# Patient Record
Sex: Female | Born: 1945 | Race: White | Hispanic: No | Smoking: Never smoker
Health system: Southern US, Community
[De-identification: ages and names within clinical notes are randomized; demographics above are authoritative.]

## PROBLEM LIST (undated history)

## (undated) DIAGNOSIS — R1310 Dysphagia, unspecified: Secondary | ICD-10-CM

## (undated) DIAGNOSIS — E785 Hyperlipidemia, unspecified: Secondary | ICD-10-CM

## (undated) DIAGNOSIS — I2510 Atherosclerotic heart disease of native coronary artery without angina pectoris: Secondary | ICD-10-CM

## (undated) DIAGNOSIS — Q438 Other specified congenital malformations of intestine: Secondary | ICD-10-CM

## (undated) DIAGNOSIS — M069 Rheumatoid arthritis, unspecified: Secondary | ICD-10-CM

## (undated) DIAGNOSIS — M8580 Other specified disorders of bone density and structure, unspecified site: Secondary | ICD-10-CM

## (undated) DIAGNOSIS — T4145XA Adverse effect of unspecified anesthetic, initial encounter: Secondary | ICD-10-CM

## (undated) DIAGNOSIS — M51369 Other intervertebral disc degeneration, lumbar region without mention of lumbar back pain or lower extremity pain: Secondary | ICD-10-CM

## (undated) DIAGNOSIS — N903 Dysplasia of vulva, unspecified: Secondary | ICD-10-CM

## (undated) DIAGNOSIS — J8410 Pulmonary fibrosis, unspecified: Secondary | ICD-10-CM

## (undated) DIAGNOSIS — K635 Polyp of colon: Secondary | ICD-10-CM

## (undated) DIAGNOSIS — K5790 Diverticulosis of intestine, part unspecified, without perforation or abscess without bleeding: Secondary | ICD-10-CM

## (undated) DIAGNOSIS — R011 Cardiac murmur, unspecified: Secondary | ICD-10-CM

## (undated) DIAGNOSIS — K5900 Constipation, unspecified: Secondary | ICD-10-CM

## (undated) DIAGNOSIS — Z9109 Other allergy status, other than to drugs and biological substances: Secondary | ICD-10-CM

## (undated) DIAGNOSIS — M349 Systemic sclerosis, unspecified: Secondary | ICD-10-CM

## (undated) DIAGNOSIS — D649 Anemia, unspecified: Secondary | ICD-10-CM

## (undated) DIAGNOSIS — K639 Disease of intestine, unspecified: Secondary | ICD-10-CM

## (undated) DIAGNOSIS — K5520 Angiodysplasia of colon without hemorrhage: Secondary | ICD-10-CM

## (undated) DIAGNOSIS — R0600 Dyspnea, unspecified: Secondary | ICD-10-CM

## (undated) DIAGNOSIS — M5136 Other intervertebral disc degeneration, lumbar region: Secondary | ICD-10-CM

## (undated) DIAGNOSIS — K219 Gastro-esophageal reflux disease without esophagitis: Secondary | ICD-10-CM

## (undated) DIAGNOSIS — N6019 Diffuse cystic mastopathy of unspecified breast: Secondary | ICD-10-CM

## (undated) DIAGNOSIS — K279 Peptic ulcer, site unspecified, unspecified as acute or chronic, without hemorrhage or perforation: Secondary | ICD-10-CM

## (undated) DIAGNOSIS — I10 Essential (primary) hypertension: Secondary | ICD-10-CM

## (undated) DIAGNOSIS — D126 Benign neoplasm of colon, unspecified: Secondary | ICD-10-CM

## (undated) DIAGNOSIS — I2584 Coronary atherosclerosis due to calcified coronary lesion: Secondary | ICD-10-CM

## (undated) DIAGNOSIS — B3781 Candidal esophagitis: Secondary | ICD-10-CM

## (undated) DIAGNOSIS — T8859XA Other complications of anesthesia, initial encounter: Secondary | ICD-10-CM

## (undated) DIAGNOSIS — I7300 Raynaud's syndrome without gangrene: Secondary | ICD-10-CM

## (undated) DIAGNOSIS — C801 Malignant (primary) neoplasm, unspecified: Secondary | ICD-10-CM

## (undated) DIAGNOSIS — K224 Dyskinesia of esophagus: Secondary | ICD-10-CM

## (undated) DIAGNOSIS — I340 Nonrheumatic mitral (valve) insufficiency: Secondary | ICD-10-CM

## (undated) HISTORY — PX: ESOPHAGOGASTRODUODENOSCOPY: SHX1529

## (undated) HISTORY — DX: Angiodysplasia of colon without hemorrhage: K55.20

## (undated) HISTORY — DX: Other allergy status, other than to drugs and biological substances: Z91.09

## (undated) HISTORY — DX: Constipation, unspecified: K59.00

## (undated) HISTORY — DX: Dysplasia of vulva, unspecified: N90.3

## (undated) HISTORY — DX: Benign neoplasm of colon, unspecified: D12.6

## (undated) HISTORY — PX: ABDOMINAL HYSTERECTOMY: SHX81

## (undated) HISTORY — PX: COLONOSCOPY: SHX174

## (undated) HISTORY — DX: Atherosclerotic heart disease of native coronary artery without angina pectoris: I25.10

## (undated) HISTORY — PX: BREAST LUMPECTOMY: SHX2

## (undated) HISTORY — DX: Candidal esophagitis: B37.81

## (undated) HISTORY — PX: CARDIAC CATHETERIZATION: SHX172

## (undated) HISTORY — PX: BREAST CYST EXCISION: SHX579

## (undated) HISTORY — DX: Diverticulosis of intestine, part unspecified, without perforation or abscess without bleeding: K57.90

## (undated) HISTORY — PX: COLON SURGERY: SHX602

## (undated) HISTORY — DX: Disease of intestine, unspecified: K63.9

## (undated) HISTORY — PX: CARPAL TUNNEL RELEASE: SHX101

## (undated) HISTORY — DX: Coronary atherosclerosis due to calcified coronary lesion: I25.84

## (undated) HISTORY — DX: Polyp of colon: K63.5

## (undated) HISTORY — PX: HEEL SPUR EXCISION: SHX1733

## (undated) HISTORY — PX: EYE SURGERY: SHX253

## (undated) HISTORY — DX: Other specified congenital malformations of intestine: Q43.8

## (undated) HISTORY — DX: Peptic ulcer, site unspecified, unspecified as acute or chronic, without hemorrhage or perforation: K27.9

## (undated) SURGERY — Surgical Case
Anesthesia: *Unknown

---
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
## 2004-11-28 ENCOUNTER — Ambulatory Visit: Payer: Self-pay | Admitting: Internal Medicine

## 2005-04-02 ENCOUNTER — Encounter: Payer: Self-pay | Admitting: Rheumatology

## 2006-01-01 ENCOUNTER — Ambulatory Visit: Payer: Self-pay | Admitting: Internal Medicine

## 2006-09-15 ENCOUNTER — Ambulatory Visit: Payer: Self-pay | Admitting: Rheumatology

## 2007-01-12 ENCOUNTER — Ambulatory Visit: Payer: Self-pay | Admitting: Internal Medicine

## 2008-02-04 ENCOUNTER — Ambulatory Visit: Payer: Self-pay | Admitting: Internal Medicine

## 2008-10-30 ENCOUNTER — Ambulatory Visit: Payer: Self-pay

## 2009-02-06 ENCOUNTER — Ambulatory Visit: Payer: Self-pay | Admitting: Internal Medicine

## 2010-03-13 ENCOUNTER — Ambulatory Visit: Payer: Self-pay | Admitting: Internal Medicine

## 2010-03-21 ENCOUNTER — Ambulatory Visit: Payer: Self-pay | Admitting: Internal Medicine

## 2010-12-03 ENCOUNTER — Ambulatory Visit: Payer: Self-pay | Admitting: Specialist

## 2011-03-24 ENCOUNTER — Ambulatory Visit: Payer: Self-pay | Admitting: Internal Medicine

## 2011-09-04 ENCOUNTER — Ambulatory Visit: Payer: Self-pay | Admitting: Gastroenterology

## 2011-09-30 ENCOUNTER — Ambulatory Visit: Payer: Self-pay | Admitting: Gastroenterology

## 2011-09-30 HISTORY — PX: COLONOSCOPY: SHX174

## 2011-10-29 ENCOUNTER — Ambulatory Visit: Payer: Self-pay | Admitting: Specialist

## 2012-01-12 ENCOUNTER — Ambulatory Visit: Payer: Self-pay | Admitting: Internal Medicine

## 2012-03-24 ENCOUNTER — Ambulatory Visit: Payer: Self-pay | Admitting: Internal Medicine

## 2012-04-06 ENCOUNTER — Ambulatory Visit: Payer: Self-pay | Admitting: Specialist

## 2012-04-06 LAB — CREATININE, SERUM
Creatinine: 0.79 mg/dL (ref 0.60–1.30)
EGFR (Non-African Amer.): >60

## 2013-03-28 ENCOUNTER — Ambulatory Visit: Payer: Self-pay | Admitting: Internal Medicine

## 2014-03-15 DIAGNOSIS — M349 Systemic sclerosis, unspecified: Secondary | ICD-10-CM | POA: Insufficient documentation

## 2014-03-15 DIAGNOSIS — M069 Rheumatoid arthritis, unspecified: Secondary | ICD-10-CM

## 2014-03-15 DIAGNOSIS — M8580 Other specified disorders of bone density and structure, unspecified site: Secondary | ICD-10-CM | POA: Insufficient documentation

## 2014-03-29 ENCOUNTER — Ambulatory Visit: Payer: Self-pay | Admitting: Internal Medicine

## 2014-03-30 DIAGNOSIS — Z79899 Other long term (current) drug therapy: Secondary | ICD-10-CM | POA: Insufficient documentation

## 2014-08-29 DIAGNOSIS — I10 Essential (primary) hypertension: Secondary | ICD-10-CM | POA: Insufficient documentation

## 2015-02-13 ENCOUNTER — Ambulatory Visit: Payer: Self-pay | Admitting: Rheumatology

## 2015-03-27 ENCOUNTER — Other Ambulatory Visit: Payer: Self-pay | Admitting: Internal Medicine

## 2015-03-27 DIAGNOSIS — Z1231 Encounter for screening mammogram for malignant neoplasm of breast: Secondary | ICD-10-CM

## 2015-04-10 ENCOUNTER — Other Ambulatory Visit: Payer: Self-pay | Admitting: Internal Medicine

## 2015-04-10 ENCOUNTER — Ambulatory Visit
Admission: RE | Admit: 2015-04-10 | Discharge: 2015-04-10 | Disposition: A | Discharge status: Home / Self Care | Payer: Medicare Other | Source: Ambulatory Visit | Attending: Internal Medicine | Admitting: Internal Medicine

## 2015-04-10 DIAGNOSIS — Z1231 Encounter for screening mammogram for malignant neoplasm of breast: Secondary | ICD-10-CM

## 2015-04-10 HISTORY — DX: Rheumatoid arthritis, unspecified: M06.9

## 2015-04-10 HISTORY — DX: Malignant (primary) neoplasm, unspecified: C80.1

## 2015-09-26 ENCOUNTER — Other Ambulatory Visit: Payer: Self-pay | Admitting: Internal Medicine

## 2015-09-26 DIAGNOSIS — R0609 Other forms of dyspnea: Principal | ICD-10-CM

## 2015-09-26 DIAGNOSIS — R61 Generalized hyperhidrosis: Secondary | ICD-10-CM

## 2015-10-01 ENCOUNTER — Ambulatory Visit
Admission: RE | Admit: 2015-10-01 | Discharge: 2015-10-01 | Disposition: A | Discharge status: Home / Self Care | Payer: Medicare Other | Source: Ambulatory Visit | Attending: Internal Medicine | Admitting: Internal Medicine

## 2015-10-01 DIAGNOSIS — M349 Systemic sclerosis, unspecified: Secondary | ICD-10-CM | POA: Diagnosis not present

## 2015-10-01 DIAGNOSIS — R61 Generalized hyperhidrosis: Secondary | ICD-10-CM

## 2015-10-01 DIAGNOSIS — R0609 Other forms of dyspnea: Secondary | ICD-10-CM

## 2016-02-04 ENCOUNTER — Other Ambulatory Visit: Payer: Self-pay | Admitting: Internal Medicine

## 2016-02-04 DIAGNOSIS — Z1231 Encounter for screening mammogram for malignant neoplasm of breast: Secondary | ICD-10-CM

## 2016-04-21 ENCOUNTER — Ambulatory Visit
Admission: RE | Admit: 2016-04-21 | Discharge: 2016-04-21 | Disposition: A | Discharge status: Home / Self Care | Payer: Medicare Other | Source: Ambulatory Visit | Attending: Internal Medicine | Admitting: Internal Medicine

## 2016-04-21 ENCOUNTER — Other Ambulatory Visit: Payer: Self-pay | Admitting: Internal Medicine

## 2016-04-21 DIAGNOSIS — Z1231 Encounter for screening mammogram for malignant neoplasm of breast: Secondary | ICD-10-CM

## 2016-06-18 ENCOUNTER — Inpatient Hospital Stay
Admission: AD | Admit: 2016-06-18 | Payer: No Typology Code available for payment source | Source: Ambulatory Visit | Admitting: Internal Medicine

## 2016-09-16 ENCOUNTER — Other Ambulatory Visit: Payer: Self-pay | Admitting: Gastroenterology

## 2016-09-16 DIAGNOSIS — R1310 Dysphagia, unspecified: Secondary | ICD-10-CM

## 2016-09-30 ENCOUNTER — Ambulatory Visit: Payer: No Typology Code available for payment source

## 2016-10-07 ENCOUNTER — Ambulatory Visit: Payer: No Typology Code available for payment source

## 2016-10-16 ENCOUNTER — Ambulatory Visit: Payer: No Typology Code available for payment source

## 2016-11-05 ENCOUNTER — Ambulatory Visit
Admission: RE | Admit: 2016-11-05 | Discharge: 2016-11-05 | Disposition: A | Discharge status: Home / Self Care | Payer: Medicare Other | Source: Ambulatory Visit | Attending: Gastroenterology | Admitting: Gastroenterology

## 2016-11-05 DIAGNOSIS — R1310 Dysphagia, unspecified: Secondary | ICD-10-CM

## 2016-11-05 DIAGNOSIS — M349 Systemic sclerosis, unspecified: Secondary | ICD-10-CM | POA: Diagnosis not present

## 2016-11-05 DIAGNOSIS — K219 Gastro-esophageal reflux disease without esophagitis: Secondary | ICD-10-CM | POA: Diagnosis not present

## 2016-11-05 DIAGNOSIS — K228 Other specified diseases of esophagus: Secondary | ICD-10-CM | POA: Insufficient documentation

## 2016-11-05 DIAGNOSIS — K449 Diaphragmatic hernia without obstruction or gangrene: Secondary | ICD-10-CM | POA: Insufficient documentation

## 2016-11-10 ENCOUNTER — Encounter: Payer: Self-pay | Admitting: General Surgery

## 2016-11-10 ENCOUNTER — Ambulatory Visit (INDEPENDENT_AMBULATORY_CARE_PROVIDER_SITE_OTHER): Payer: Medicare Other | Admitting: General Surgery

## 2016-11-10 VITALS — BP 136/74 | Resp 16 | Ht 61.0 in | Wt 199.0 lb

## 2016-11-10 DIAGNOSIS — N9089 Other specified noninflammatory disorders of vulva and perineum: Secondary | ICD-10-CM

## 2016-11-10 DIAGNOSIS — C439 Malignant melanoma of skin, unspecified: Secondary | ICD-10-CM

## 2016-11-10 NOTE — Patient Instructions (Signed)
Patient to be scheduled for left thigh incision at [facility]

## 2016-11-10 NOTE — Progress Notes (Signed)
Patient ID: Erin [name], female   DOB: 1946/11/07, 70 y.o.   MRN: [id number]  Chief Complaint  Patient presents with  . Mass    HPI Erin [name] is a 70 y.o. female here today for a evaluation of a left thigh melanoma. The patient reports that since last year screening dermatology exam she had noticed a white nodule on the mid/distal aspect of the left anterior thigh. In the last 3-4 months this had become pigmented with irregular margins. She recently underwent a biopsy of this area showing a melanoma.  The patient reports that there is a small area on the inferior aspect of her labia that the dermatologist has normally examined as part of her annual exam, and she felt that this may have been overlooked with the changes of the left thigh lesion and resulting biopsy. She herself cannot see this area but is aware of it by palpation.   HPI  Past Medical History:  Diagnosis Date  . Cancer ([place])    melanoma  . RA (rheumatoid arthritis) ([place])     Past Surgical History:  Procedure Laterality Date  . ABDOMINAL HYSTERECTOMY    . BREAST CYST EXCISION Left 20+ years ago   No scar visible    Family History  Problem Relation Age of Onset  . Endometrial cancer Mother 17  . Cancer Father 69    bladder    Social History Social History  Substance Use Topics  . Smoking status: Never Smoker  . Smokeless tobacco: Never Used  . Alcohol use No    Allergies  Allergen Reactions  . Remicade [Infliximab] Shortness Of Breath and Itching  . Fosamax [Alendronate Sodium]     GI Bleed   . Procardia [Nifedipine] Hives  . Sulfur Swelling  . Wellbutrin [Bupropion] Anxiety    Current Outpatient Prescriptions  Medication Sig Dispense Refill  . ALPRAZolam (XANAX) 0.5 MG tablet TAKE ONE TABLET FOUR TIMES DAILY    . amLODipine (NORVASC) 5 MG tablet TAKE 2 TABLETS BY MOUTH ONCE DAILY    . aspirin EC 81 MG tablet Take by mouth.    . Clobetasol Prop Emollient Base (CLOBETASOL PROPIONATE E) 0.05  % emollient cream Apply topically 2 (two) times daily.    Marland Kitchen dexlansoprazole (DEXILANT) 60 MG capsule Take by mouth.    Marland Kitchen FLUoxetine (PROZAC) 20 MG capsule TAKE FOUR CAPSULES EVERY DAY    . folic acid (FOLVITE) A999333 MCG tablet Take by mouth.    . furosemide (LASIX) 20 MG tablet TAKE ONE TABLET BY MOUTH EVERY DAY AS NEEDED FOR EDEMA    . latanoprost (XALATAN) 0.005 % ophthalmic solution Apply to eye.    . methotrexate (RHEUMATREX) 2.5 MG tablet TAKE 10 TABLETS EVERY 7 DAYS    . Multiple Vitamin (MULTI-VITAMINS) TABS Take by mouth.    . Omega-3 Fatty Acids (FISH OIL PO) Take by mouth.    . simvastatin (ZOCOR) 80 MG tablet TAKE ONE TABLET AT BEDTIME    . zolpidem (AMBIEN) 10 MG tablet Take by mouth.     No current facility-administered medications for this visit.     Review of Systems Review of Systems  Blood pressure 136/74, resp. rate 16, height 5\' 1"  (1.549 m), weight 199 lb (90.3 kg).  Physical Exam Physical Exam  Constitutional: She is oriented to person, place, and time. She appears well-developed and well-nourished.  Eyes: Conjunctivae are normal. No scleral icterus.  Neck: Neck supple.  Cardiovascular: Normal rate, regular rhythm and normal heart sounds.  Pulmonary/Chest: Effort normal and breath sounds normal.  Abdominal: There is no hepatosplenomegaly.  Genitourinary:     Genitourinary Comments: 6 mm pink flat mass at the labia   Musculoskeletal:       Legs: Lymphadenopathy:    She has no cervical adenopathy.       Left: No inguinal adenopathy present.  Neurological: She is alert and oriented to person, place, and time.  Skin: Skin is warm and dry.       Data Reviewed Biopsy report dated 10/29/2016 from a shave biopsy of the left anterior distal thigh showed a invasive malignant melanoma. Breslow thickness 1.39 mm. Heart level IV. No ulceration. No evidence of regression. Margins close at 0.23 mm deep. Pathologic PT 2A. Reported size 8 x 8 x 2 mm. Laboratory  studies from her primary care office dated 11/05/2016 showed a hemoglobin of 11.2, without dramatic change over the last year. MCV 91. White blood cell count 7800. Platelet count 325,000.  Comprehensive metabolic panel of the same date was normal. Potassium 5.1. Normal renal function. Normal liver function.   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  Long-standing nodule inferior aspect of the left labia.    Plan    Indication for wide excision and sentinel node biopsy from the left thigh lesion reviewed. As the patient is unable to visualize the labia lesion and reports that this is an area of her concern, we will excise this at the same setting. Potential for slow healing of the thigh lesion as well as lower extremity edema with lymph node biopsy was reviewed.  A baseline chest x-ray has been recommended due to the thickness of the lesion. Liver function studies did not bear repeating.    Patient to be scheduled for left thigh incision at [facility].  Patient's surgery has been scheduled for 11-19-16 at [facility]. It is okay for patient to continue 81 mg aspirin once daily.   This information has been scribed by Gaspar Cola CMA.  Robert Bellow 11/11/2016, 10:54 AM

## 2016-11-11 ENCOUNTER — Telehealth: Payer: Self-pay

## 2016-11-11 ENCOUNTER — Other Ambulatory Visit: Payer: Self-pay | Admitting: *Deleted

## 2016-11-11 DIAGNOSIS — C439 Malignant melanoma of skin, unspecified: Secondary | ICD-10-CM | POA: Insufficient documentation

## 2016-11-11 DIAGNOSIS — N9089 Other specified noninflammatory disorders of vulva and perineum: Secondary | ICD-10-CM | POA: Insufficient documentation

## 2016-11-11 NOTE — Telephone Encounter (Signed)
Patient notified of arrival time and location for surgery. The patient will arrive at the Radiology desk at [facility] on 11/19/16 at 10:15 am. The patient is aware of date, time, and instructions.

## 2016-11-14 ENCOUNTER — Inpatient Hospital Stay: Admission: RE | Admit: 2016-11-14 | Payer: No Typology Code available for payment source | Source: Ambulatory Visit

## 2016-11-17 ENCOUNTER — Encounter
Admission: RE | Admit: 2016-11-17 | Discharge: 2016-11-17 | Disposition: A | Discharge status: Home / Self Care | Payer: Medicare Other | Source: Ambulatory Visit | Attending: General Surgery | Admitting: General Surgery

## 2016-11-17 HISTORY — DX: Gastro-esophageal reflux disease without esophagitis: K21.9

## 2016-11-17 HISTORY — DX: Anemia, unspecified: D64.9

## 2016-11-17 HISTORY — DX: Cardiac murmur, unspecified: R01.1

## 2016-11-17 HISTORY — DX: Pulmonary fibrosis, unspecified: J84.10

## 2016-11-17 HISTORY — DX: Dyspnea, unspecified: R06.00

## 2016-11-17 HISTORY — DX: Systemic sclerosis, unspecified: M34.9

## 2016-11-17 HISTORY — DX: Nonrheumatic mitral (valve) insufficiency: I34.0

## 2016-11-17 NOTE — Pre-Procedure Instructions (Signed)
Dr Ronelle Nigh called back and stated that he talked with Dr Bary Castilla and that Dr Bary Castilla is going to talk with Dr Doy Hutching to see if they can get pt in before surgery on 11-19-16 to see Dr Rockey Situ

## 2016-11-17 NOTE — Pre-Procedure Instructions (Signed)
Called Dr Ronelle Nigh regarding pt stating that she gets SOB with exertion and is unable to walk a mile without getting SOB- Pt states that Dr Doy Hutching wants pt to see Dr Rockey Situ after her 11-19-16 surgery. Mentioned to Dr Ronelle Nigh that in Dr Doy Hutching H&P from 11-12-16, Dr Doy Hutching states pt ok to proceed with surgery. Pt with a history of scleroderma. Dr Ronelle Nigh is going to call Dr Bary Castilla and discuss this with him and call me back

## 2016-11-17 NOTE — Patient Instructions (Signed)
  Your procedure is scheduled on: 11-19-16 [facility]) Report to [place] (2ND DESK ON RIGHT) @ 10:15 AM   Remember: Instructions that are not followed completely may result in serious medical risk, up to and including death, or upon the discretion of your surgeon and anesthesiologist your surgery may need to be rescheduled.    _x___ 1. Do not eat food or drink liquids after midnight. No gum chewing or hard candies.     __x__ 2. No Alcohol for 24 hours before or after surgery.   __x__3. No Smoking for 24 prior to surgery.   ____  4. Bring all medications with you on the day of surgery if instructed.    __x__ 5. Notify your doctor if there is any change in your medical condition     (cold, fever, infections).     Do not wear jewelry, make-up, hairpins, clips or nail polish.  Do not wear lotions, powders, or perfumes. You may wear deodorant.  Do not shave 48 hours prior to surgery. Men may shave face and neck.  Do not bring valuables to the hospital.    [facility] is not responsible for any belongings or valuables.               Contacts, dentures or bridgework may not be worn into surgery.  Leave your suitcase in the car. After surgery it may be brought to your room.  For patients admitted to the hospital, discharge time is determined by your treatment team.   Patients discharged the day of surgery will not be allowed to drive home.  You will need someone to drive you home and stay with you the night of your procedure.    Please read over the following fact sheets that you were given:   [facility] Preparing for Surgery and or MRSA Information   _x___ Take these medicines the morning of surgery with A SIP OF WATER:    1. AMLODIPINE (NORVASC)  2. DEXILANT  3. FLUOXETINE (PROZAC)  4.  5.  6.  ____Fleets enema or Magnesium Citrate as directed.   _x___ Use CHG Soap or sage wipes as directed on instruction sheet   ____ Use inhalers on the day of surgery and  bring to hospital day of surgery  ____ Stop metformin 2 days prior to surgery    ____ Take 1/2 of usual insulin dose the night before surgery and none on the morning of   surgery.   _X___ Stop Aspirin, Coumadin, Pllavix ,Eliquis, Effient, or Pradaxa-OK TO CONTINUE 81 MG ASPIRIN PER DR BYRNETT  __ Stop Anti-inflammatories such as Advil, Aleve, Ibuprofen, Motrin, Naproxen,          Naprosyn, Goodies powders or aspirin products. Ok to take Tylenol.   _X___ Stop supplements until after surgery-STOP FISH OIL NOW  ____ Bring C-Pap to the hospital.

## 2016-11-17 NOTE — Pre-Procedure Instructions (Signed)
Dr Ronelle Nigh called back and said that Dr Bary Castilla spoke with Dr Doy Hutching and Dr Doy Hutching said that pt had been seen by [facility] cardiology previously and that pt wanted to change cardiologists and that is why she was being referred to [facility]. Dr Ronelle Nigh said that when pt comes in for her EKG tomorrow and if there is significant SOB to call the Anesthesiologist on call and have one of them come and evaluate her while in PAT

## 2016-11-18 ENCOUNTER — Encounter
Admission: RE | Admit: 2016-11-18 | Discharge: 2016-11-18 | Disposition: A | Discharge status: Home / Self Care | Payer: Medicare Other | Source: Ambulatory Visit | Attending: General Surgery | Admitting: General Surgery

## 2016-11-18 ENCOUNTER — Ambulatory Visit
Admission: RE | Admit: 2016-11-18 | Discharge: 2016-11-18 | Disposition: A | Discharge status: Home / Self Care | Payer: Medicare Other | Source: Ambulatory Visit | Attending: General Surgery | Admitting: General Surgery

## 2016-11-18 DIAGNOSIS — I700 Atherosclerosis of aorta: Secondary | ICD-10-CM | POA: Diagnosis not present

## 2016-11-18 DIAGNOSIS — J984 Other disorders of lung: Secondary | ICD-10-CM | POA: Insufficient documentation

## 2016-11-18 DIAGNOSIS — R011 Cardiac murmur, unspecified: Secondary | ICD-10-CM | POA: Diagnosis not present

## 2016-11-18 DIAGNOSIS — D0372 Melanoma in situ of left lower limb, including hip: Secondary | ICD-10-CM

## 2016-11-18 NOTE — Pre-Procedure Instructions (Signed)
INFORMED DR Ronelle Nigh THAT WHEN PT CAME IN FOR EKG, THERE WAS NO NOTICEABLE SOB-CHECKED HER SAT AND SHE STAYED BETWEEN 98-99 % RA

## 2016-11-18 NOTE — Pre-Procedure Instructions (Signed)
CALLED DR [id number] REGARDING ABNORMAL EKG- DR [id number] REVEIWED EKG IN EPIC AND STATES THAT PT IS OK TO PROCEED WITH SURGERY

## 2016-11-19 ENCOUNTER — Encounter: Payer: Self-pay | Admitting: *Deleted

## 2016-11-19 ENCOUNTER — Encounter
Admission: RE | Admit: 2016-11-19 | Discharge: 2016-11-19 | Disposition: A | Discharge status: Home / Self Care | Payer: Medicare Other | Source: Ambulatory Visit | Attending: General Surgery | Admitting: General Surgery

## 2016-11-19 ENCOUNTER — Ambulatory Visit: Payer: Medicare Other | Admitting: Anesthesiology

## 2016-11-19 ENCOUNTER — Encounter
Admission: RE | Disposition: A | Discharge status: Home / Self Care | Payer: Self-pay | Source: Ambulatory Visit | Attending: General Surgery

## 2016-11-19 ENCOUNTER — Ambulatory Visit
Admission: RE | Admit: 2016-11-19 | Discharge: 2016-11-19 | Disposition: A | Discharge status: Home / Self Care | Payer: Medicare Other | Source: Ambulatory Visit | Attending: General Surgery | Admitting: General Surgery

## 2016-11-19 DIAGNOSIS — I340 Nonrheumatic mitral (valve) insufficiency: Secondary | ICD-10-CM | POA: Insufficient documentation

## 2016-11-19 DIAGNOSIS — C4372 Malignant melanoma of left lower limb, including hip: Secondary | ICD-10-CM | POA: Insufficient documentation

## 2016-11-19 DIAGNOSIS — M8580 Other specified disorders of bone density and structure, unspecified site: Secondary | ICD-10-CM | POA: Diagnosis not present

## 2016-11-19 DIAGNOSIS — I10 Essential (primary) hypertension: Secondary | ICD-10-CM | POA: Insufficient documentation

## 2016-11-19 DIAGNOSIS — M069 Rheumatoid arthritis, unspecified: Secondary | ICD-10-CM | POA: Insufficient documentation

## 2016-11-19 DIAGNOSIS — M349 Systemic sclerosis, unspecified: Secondary | ICD-10-CM | POA: Insufficient documentation

## 2016-11-19 DIAGNOSIS — I2510 Atherosclerotic heart disease of native coronary artery without angina pectoris: Secondary | ICD-10-CM | POA: Insufficient documentation

## 2016-11-19 DIAGNOSIS — Z86010 Personal history of colonic polyps: Secondary | ICD-10-CM | POA: Insufficient documentation

## 2016-11-19 DIAGNOSIS — I7300 Raynaud's syndrome without gangrene: Secondary | ICD-10-CM | POA: Insufficient documentation

## 2016-11-19 DIAGNOSIS — C439 Malignant melanoma of skin, unspecified: Secondary | ICD-10-CM

## 2016-11-19 DIAGNOSIS — E7800 Pure hypercholesterolemia, unspecified: Secondary | ICD-10-CM | POA: Diagnosis not present

## 2016-11-19 DIAGNOSIS — K219 Gastro-esophageal reflux disease without esophagitis: Secondary | ICD-10-CM | POA: Diagnosis not present

## 2016-11-19 HISTORY — PX: MASS EXCISION: SHX2000

## 2016-11-19 HISTORY — PX: EXCISION HYDRADENITIS LABIA: SHX6273

## 2016-11-19 HISTORY — PX: SENTINEL NODE BIOPSY: SHX6608

## 2016-11-19 SURGERY — EXCISION MASS
Anesthesia: General | Laterality: Left | Wound class: Clean

## 2016-11-19 MED ORDER — FENTANYL CITRATE (PF) 100 MCG/2ML IJ SOLN: 25.0000 ug | INTRAMUSCULAR | Status: DC | PRN

## 2016-11-19 MED ORDER — ONDANSETRON HCL 4 MG/2ML IJ SOLN: 4.0000 mg | Freq: Once | INTRAMUSCULAR | Status: DC | PRN

## 2016-11-19 MED ORDER — TECHNETIUM TC 99M SULFUR COLLOID FILTERED
0.4440 | Freq: Once | INTRAVENOUS | Status: AC | PRN
  Administered 2016-11-19: 0.444 via INTRADERMAL

## 2016-11-19 MED ORDER — DEXAMETHASONE SODIUM PHOSPHATE 10 MG/ML IJ SOLN
INTRAMUSCULAR | Status: DC | PRN
  Administered 2016-11-19: 10 mg via INTRAVENOUS

## 2016-11-19 MED ORDER — ACETAMINOPHEN 10 MG/ML IV SOLN
INTRAVENOUS | Status: DC | PRN
  Administered 2016-11-19: 1000 mg via INTRAVENOUS

## 2016-11-19 MED ORDER — FENTANYL CITRATE (PF) 100 MCG/2ML IJ SOLN
INTRAMUSCULAR | Status: DC | PRN
  Administered 2016-11-19 (×2): 50 ug via INTRAVENOUS

## 2016-11-19 MED ORDER — MIDAZOLAM HCL 2 MG/2ML IJ SOLN
INTRAMUSCULAR | Status: DC | PRN
  Administered 2016-11-19: 2 mg via INTRAVENOUS
  Administered 2016-11-19 (×2): 50 mg via INTRAVENOUS

## 2016-11-19 MED ORDER — LIDOCAINE-EPINEPHRINE 1 %-1:100000 IJ SOLN
INTRAMUSCULAR | Status: DC | PRN
  Administered 2016-11-19: 20 mL

## 2016-11-19 MED ORDER — BUPIVACAINE HCL 0.5 % IJ SOLN
INTRAMUSCULAR | Status: DC | PRN
  Administered 2016-11-19: 30 mL

## 2016-11-19 MED ORDER — HYDROCODONE-ACETAMINOPHEN 5-325 MG PO TABS
ORAL_TABLET | ORAL | Status: AC
  Filled 2016-11-19: qty 1

## 2016-11-19 MED ORDER — HYDROCODONE-ACETAMINOPHEN 5-325 MG PO TABS: 1.0000 | ORAL_TABLET | ORAL | 0 refills | Status: DC | PRN

## 2016-11-19 MED ORDER — PROPOFOL 10 MG/ML IV BOLUS
INTRAVENOUS | Status: DC | PRN
  Administered 2016-11-19: 50 mg via INTRAVENOUS
  Administered 2016-11-19: 200 mg via INTRAVENOUS

## 2016-11-19 MED ORDER — EPHEDRINE SULFATE 50 MG/ML IJ SOLN
INTRAMUSCULAR | Status: DC | PRN
  Administered 2016-11-19: 10 mg via INTRAVENOUS

## 2016-11-19 MED ORDER — ONDANSETRON HCL 4 MG/2ML IJ SOLN
INTRAMUSCULAR | Status: DC | PRN
  Administered 2016-11-19: 4 mg via INTRAVENOUS

## 2016-11-19 MED ORDER — SUCCINYLCHOLINE CHLORIDE 20 MG/ML IJ SOLN
INTRAMUSCULAR | Status: DC | PRN
  Administered 2016-11-19: 80 mg via INTRAVENOUS

## 2016-11-19 MED ORDER — METHYLENE BLUE 0.5 % INJ SOLN
INTRAVENOUS | Status: DC | PRN
  Administered 2016-11-19: 4 mL via SUBMUCOSAL

## 2016-11-19 MED ORDER — BUPIVACAINE HCL (PF) 0.5 % IJ SOLN
INTRAMUSCULAR | Status: AC
  Filled 2016-11-19: qty 30

## 2016-11-19 MED ORDER — BACITRACIN ZINC 500 UNIT/GM EX OINT
TOPICAL_OINTMENT | CUTANEOUS | Status: AC
  Filled 2016-11-19: qty 28.35

## 2016-11-19 MED ORDER — METHYLENE BLUE 0.5 % INJ SOLN
INTRAVENOUS | Status: AC
  Filled 2016-11-19: qty 10

## 2016-11-19 MED ORDER — LIDOCAINE-EPINEPHRINE 1 %-1:100000 IJ SOLN
INTRAMUSCULAR | Status: AC
  Filled 2016-11-19: qty 1

## 2016-11-19 MED ORDER — HYDROCODONE-ACETAMINOPHEN 5-325 MG PO TABS
1.0000 | ORAL_TABLET | ORAL | Status: DC | PRN
  Administered 2016-11-19: 1 via ORAL

## 2016-11-19 MED ORDER — LACTATED RINGERS IV SOLN
INTRAVENOUS | Status: DC
  Administered 2016-11-19: 13:00:00 via INTRAVENOUS

## 2016-11-19 SURGICAL SUPPLY — 42 items
BANDAGE ELASTIC 4 LF NS (GAUZE/BANDAGES/DRESSINGS) IMPLANT
BANDAGE ELASTIC 6 LF NS (GAUZE/BANDAGES/DRESSINGS) IMPLANT
BNDG GAUZE 4.5X4.1 6PLY STRL (MISCELLANEOUS) IMPLANT
CANISTER SUCT 1200ML W/VALVE (MISCELLANEOUS) ×3 IMPLANT
CHLORAPREP W/TINT 26ML (MISCELLANEOUS) ×3 IMPLANT
CLOSURE WOUND 1/2 X4 (GAUZE/BANDAGES/DRESSINGS)
COVER PROBE FLX POLY STRL (MISCELLANEOUS) ×3 IMPLANT
DRAPE LAPAROTOMY 100X77 ABD (DRAPES) ×3 IMPLANT
DRAPE SHEET LG 3/4 BI-LAMINATE (DRAPES) ×3 IMPLANT
DRESSING TELFA 4X3 1S ST N-ADH (GAUZE/BANDAGES/DRESSINGS) IMPLANT
DRSG TEGADERM 4X4.75 (GAUZE/BANDAGES/DRESSINGS) IMPLANT
ELECT REM PT RETURN 9FT ADLT (ELECTROSURGICAL) ×3
ELECTRODE REM PT RTRN 9FT ADLT (ELECTROSURGICAL) ×1 IMPLANT
GAUZE SPONGE 4X4 12PLY STRL (GAUZE/BANDAGES/DRESSINGS) IMPLANT
GLOVE BIO SURGEON STRL SZ7.5 (GLOVE) ×6 IMPLANT
GLOVE INDICATOR 8.0 STRL GRN (GLOVE) ×6 IMPLANT
GOWN STRL REUS W/ TWL LRG LVL3 (GOWN DISPOSABLE) ×1 IMPLANT
GOWN STRL REUS W/ TWL XL LVL3 (GOWN DISPOSABLE) ×1 IMPLANT
GOWN STRL REUS W/TWL LRG LVL3 (GOWN DISPOSABLE) ×2
GOWN STRL REUS W/TWL XL LVL3 (GOWN DISPOSABLE) ×2
KIT RM TURNOVER STRD PROC AR (KITS) ×3 IMPLANT
LABEL OR SOLS (LABEL) ×3 IMPLANT
NDL SAFETY 18GX1.5 (NEEDLE) ×3 IMPLANT
NDL SAFETY 22GX1.5 (NEEDLE) ×6 IMPLANT
NS IRRIG 500ML POUR BTL (IV SOLUTION) ×3 IMPLANT
PACK BASIN MINOR ARMC (MISCELLANEOUS) ×3 IMPLANT
PAD PREP 24X41 OB/GYN DISP (PERSONAL CARE ITEMS) ×3 IMPLANT
SLEVE PROBE SENORX GAMMA FIND (MISCELLANEOUS) IMPLANT
STOCKINETTE STRL 6IN 960660 (GAUZE/BANDAGES/DRESSINGS) IMPLANT
STRIP CLOSURE SKIN 1/2X4 (GAUZE/BANDAGES/DRESSINGS) IMPLANT
SUT ETHILON 4-0 (SUTURE) ×8
SUT ETHILON 4-0 FS2 18XMFL BLK (SUTURE) ×4
SUT VIC AB 2-0 CT1 (SUTURE) IMPLANT
SUT VIC AB 2-0 CT1 27 (SUTURE) ×6
SUT VIC AB 2-0 CT1 TAPERPNT 27 (SUTURE) ×3 IMPLANT
SUT VIC AB 3-0 SH 27 (SUTURE) ×2
SUT VIC AB 3-0 SH 27X BRD (SUTURE) ×1 IMPLANT
SUT VIC AB 4-0 FS2 27 (SUTURE) ×3 IMPLANT
SUT VICRYL+ 3-0 144IN (SUTURE) ×3 IMPLANT
SUTURE ETHLN 4-0 FS2 18XMF BLK (SUTURE) ×4 IMPLANT
SWABSTK COMLB BENZOIN TINCTURE (MISCELLANEOUS) IMPLANT
SYRINGE 10CC LL (SYRINGE) ×3 IMPLANT

## 2016-11-19 NOTE — Transfer of Care (Signed)
Immediate Anesthesia Transfer of Care Note  Patient: MONIYA MEDITZ  Procedure(s) Performed: Procedure(s): EXCISION LEFT THIGH MELANOMA (Left) INGUINAL SENTINEL NODE BIOPSY (Left) EXCISION LABIAL MASS (Left)  Patient Location: PACU  Anesthesia Type:General  Level of Consciousness: patient cooperative and lethargic  Airway & Oxygen Therapy: Patient Spontanous Breathing and Patient connected to face mask oxygen  Post-op Assessment: Report given to RN and Post -op Vital signs reviewed and stable  Post vital signs: Reviewed and stable  Last Vitals:  Vitals:   11/19/16 1204 11/19/16 1457  BP: 132/61 (!) 154/58  Pulse: 73 74  Resp: 16 19  Temp: 36.8 C 36.4 C    Last Pain:  Vitals:   11/19/16 1204  TempSrc: Oral         Complications: No apparent anesthesia complications

## 2016-11-19 NOTE — Anesthesia Procedure Notes (Signed)
Procedure Name: Intubation Date/Time: 11/19/2016 1:06 PM Performed by: Jonna Clark Pre-anesthesia Checklist: Patient identified, Patient being monitored, Timeout performed, Emergency Drugs available and Suction available Patient Re-evaluated:Patient Re-evaluated prior to inductionOxygen Delivery Method: Circle system utilized Preoxygenation: Pre-oxygenation with 100% oxygen Intubation Type: IV induction Ventilation: Mask ventilation without difficulty Laryngoscope Size: Mac and 3 Grade View: Grade I Tube type: Oral Tube size: 7.0 mm Number of attempts: 1 Placement Confirmation: ETT inserted through vocal cords under direct vision,  positive ETCO2 and breath sounds checked- equal and bilateral Secured at: 21 cm Tube secured with: Tape Dental Injury: Teeth and Oropharynx as per pre-operative assessment

## 2016-11-19 NOTE — H&P (Signed)
No interval change in history or exam. For excision left leg melanoma and SLN biopsy, biopsy left labial lesion.

## 2016-11-19 NOTE — Discharge Instructions (Signed)

## 2016-11-19 NOTE — Op Note (Signed)
Preoperative diagnosis: Left leg melanoma. Nodule left lower labia.  Postoperative diagnosis: Same.  Operative procedure: Left leg wide excision with sentinel node biopsy, excision left labial nodule.  Operating surgeon: Ollen Bowl, M.D.  Anesthesia: Gen. endotracheal, Marcaine 0 25 percent with Xylocaine 0.5% with 1-200,000 epinephrine: 50 mL.  Estimated blood loss: 20 mL.  Clinical note: This 70 year old female was recently identified with a 1.34 mm Clark's level IV melanoma of the left distal medial thigh. She was a candidate for wide excision and sentinel node biopsy. She is also been aware of a nodule at the base of the left labia for some time and it was elected to excise this at the same time.  Operative note: The patient was injected with technetium sulfur colloid prior to the procedure. Area of node bearing tissue was marked by the radiologist. After induction of general anesthesia 4 mL of 0.5% methylene blue was injected around the melanoma site which measured approximately 1.2 cm in diameter. Margins measuring 1.5 cm were made around the lesion for a total width of approximate 4 cm in width and 8 cm in length vertically oriented. Skin was incised sharply and remaining dissection with electrocautery. The dissection was carried down to but did not involve the vastus intermedius fascia. Hemostasis was with electrocautery. The deep tissue is mobilized 3 cm circumferentially at the level of the fascia and approximated with interrupted 2-0 Vicryl figure-of-eight sutures in 2 layers. The skin was closed with a running 4-0 nylon suture at either end and then the midportion of the wound was closed with interrupted 4-0 nylon simple sutures. Telfa and Tegaderm dressing applied.  Attention was turned to the inguinal area. The node cecum was used to identify the area of increased uptake. Local anesthesia was infiltrated and a transverse incision made. The skin was divided sharply. Hemostasis was  electrocautery and 3-0 Vicryl ties. A single hot blue node was identified. This was resected and sent for permanent section. Scanning through the inguinal area showed no additional nodal tissue. This wound was closed in layers with running 3-0 Vicryl to the deep dermal layer and a running 4-0 Vicryl septic suture to the skin. Benzoin, Steri-Strips, Telfa and Tegaderm dressing was applied.  Attention was turned to labia where Betadine was applied after the patient was placed in a frog-leg position. Local anesthesia was infiltrated and the area was excised with elliptical incision. This was approximate centimeter in diameter. The specimen was orientated and sent in formalin for routine histology. The wound was closed with a running 4-0 Vicryl septic suture after hemostasis was achieved electrocautery. Bacitracin ointment was applied to this area to enter the procedure.  The patient tolerated the procedure well was taken to recovery in stable condition.

## 2016-11-19 NOTE — Anesthesia Preprocedure Evaluation (Signed)
Anesthesia Evaluation  Patient identified by MRN, date of birth, ID band Patient awake    Reviewed: Allergy & Precautions, H&P , NPO status , Patient's Chart, lab work & pertinent test results, reviewed documented beta blocker date and time   Airway Mallampati: II  TM Distance: >3 FB Neck ROM: full    Dental  (+) Teeth Intact   Pulmonary neg pulmonary ROS, shortness of breath,    Pulmonary exam normal        Cardiovascular Exercise Tolerance: Good negative cardio ROS Normal cardiovascular exam+ Valvular Problems/Murmurs  Rate:Normal     Neuro/Psych negative neurological ROS  negative psych ROS   GI/Hepatic negative GI ROS, Neg liver ROS, GERD  ,  Endo/Other  negative endocrine ROS  Renal/GU negative Renal ROS  negative genitourinary   Musculoskeletal  (+) Arthritis ,   Abdominal   Peds  Hematology negative hematology ROS (+) anemia ,   Anesthesia Other Findings   Reproductive/Obstetrics negative OB ROS                             Anesthesia Physical Anesthesia Plan  ASA: II  Anesthesia Plan: General LMA   Post-op Pain Management:    Induction:   Airway Management Planned:   Additional Equipment:   Intra-op Plan:   Post-operative Plan:   Informed Consent: I have reviewed the patients History and Physical, chart, labs and discussed the procedure including the risks, benefits and alternatives for the proposed anesthesia with the patient or authorized representative who has indicated his/her understanding and acceptance.     Plan Discussed with: CRNA  Anesthesia Plan Comments:         Anesthesia Quick Evaluation

## 2016-11-20 ENCOUNTER — Encounter: Payer: Self-pay | Admitting: General Surgery

## 2016-11-24 ENCOUNTER — Encounter: Payer: Self-pay | Admitting: General Surgery

## 2016-11-24 ENCOUNTER — Ambulatory Visit (INDEPENDENT_AMBULATORY_CARE_PROVIDER_SITE_OTHER): Payer: Medicare Other | Admitting: General Surgery

## 2016-11-24 VITALS — BP 132/68 | HR 74 | Resp 16 | Ht 61.0 in | Wt 196.0 lb

## 2016-11-24 DIAGNOSIS — C439 Malignant melanoma of skin, unspecified: Secondary | ICD-10-CM

## 2016-11-24 NOTE — Anesthesia Postprocedure Evaluation (Signed)
Anesthesia Post Note  Patient: Erin [name]  Procedure(s) Performed: Procedure(s) (LRB): EXCISION LEFT THIGH MELANOMA (Left) INGUINAL SENTINEL NODE BIOPSY (Left) EXCISION LABIAL MASS (Left)  Patient location during evaluation: PACU Anesthesia Type: General Level of consciousness: awake Pain management: pain level controlled Vital Signs Assessment: post-procedure vital signs reviewed and stable Respiratory status: spontaneous breathing Cardiovascular status: stable Anesthetic complications: no    Last Vitals:  Vitals:   11/19/16 1559 11/19/16 1626  BP: (!) 146/69 100/61  Pulse: 62 82  Resp: 20 20  Temp: 36.5 C     Last Pain:  Vitals:   11/20/16 0846  TempSrc:   PainSc: 0-No pain                 VAN STAVEREN,Zyia Kaneko

## 2016-11-24 NOTE — Patient Instructions (Signed)
Return in one week for suture removal

## 2016-11-24 NOTE — Progress Notes (Signed)
Patient ID: Erin [name], female   DOB: 30-Aug-1946, 70 y.o.   MRN: [id number]  Chief Complaint  Patient presents with  . Follow-up    HPI Erin [name] is a 70 y.o. female here today for her post op melanoma removal done on 11/19/2016. Patient states she is doing well.   The patient reports that the most soreness she experienced was in her throat. HPI  Past Medical History:  Diagnosis Date  . Anemia    h/o  . Cancer ([place])    melanoma  . Dyspnea    with exertion-unable to walk a mile without getting sob- dr sparks set pt up to see Dr Rockey Situ after 11-19-16 surgery  . GERD (gastroesophageal reflux disease)   . Heart murmur    h/o   . Mitral regurgitation   . Pulmonary fibrosis ([place])    per Dr Raul Del  . RA (rheumatoid arthritis) ([place])   . Scleroderma [facility])     Past Surgical History:  Procedure Laterality Date  . ABDOMINAL HYSTERECTOMY    . BREAST CYST EXCISION Left 20+ years ago   No scar visible  . CARPAL TUNNEL RELEASE Bilateral   . EXCISION HYDRADENITIS LABIA Left 11/19/2016   Procedure: EXCISION LABIAL MASS;  Surgeon: Robert Bellow, MD;  Location: [facility] ORS;  Service: General;  Laterality: Left;  . EYE SURGERY Bilateral    cataracts  . HEEL SPUR EXCISION    . MASS EXCISION Left 11/19/2016   Procedure: EXCISION LEFT THIGH MELANOMA;  Surgeon: Robert Bellow, MD;  Location: [facility] ORS;  Service: General;  Laterality: Left;  . SENTINEL NODE BIOPSY Left 11/19/2016   Procedure: INGUINAL SENTINEL NODE BIOPSY;  Surgeon: Robert Bellow, MD;  Location: [facility] ORS;  Service: General;  Laterality: Left;    Family History  Problem Relation Age of Onset  . Endometrial cancer Mother 49  . Cancer Father 30    bladder    Social History Social History  Substance Use Topics  . Smoking status: Never Smoker  . Smokeless tobacco: Never Used  . Alcohol use No    Allergies  Allergen Reactions  . Remicade [Infliximab] Shortness Of Breath and Itching  . Fosamax  [Alendronate Sodium]     GI Bleed   . Procardia [Nifedipine] Hives  . Sulfur Swelling  . Wellbutrin [Bupropion] Anxiety    Current Outpatient Prescriptions  Medication Sig Dispense Refill  . Abatacept (ORENCIA IV) Inject 1 Dose into the vein every 30 (thirty) days.    . ALPRAZolam (XANAX) 0.5 MG tablet Take 0.5mg s twice daily as needed for anxiety    . amLODipine (NORVASC) 5 MG tablet 5 mg bid    . aspirin EC 81 MG tablet Take 81 mg by mouth at bedtime.     . cetirizine (ZYRTEC) 10 MG tablet Take 10 mg by mouth daily as needed for allergies.    . Clobetasol Prop Emollient Base (CLOBETASOL PROPIONATE E) 0.05 % emollient cream Apply 1 application topically 2 (two) times daily as needed (sores).     Marland Kitchen dexlansoprazole (DEXILANT) 60 MG capsule Take 60 mg by mouth at bedtime.     Marland Kitchen FLUoxetine (PROZAC) 20 MG capsule 60 mg by mouth every morning    . folic acid (FOLVITE) A999333 MCG tablet Take 400 mcg by mouth daily.     . furosemide (LASIX) 20 MG tablet Take 20mg s daily as needed for swelling    . HYDROcodone-acetaminophen (NORCO) 5-325 MG tablet Take 1-2 tablets by mouth every  4 (four) hours as needed for moderate pain. 30 tablet 0  . latanoprost (XALATAN) 0.005 % ophthalmic solution Place 1 drop into both eyes at bedtime.     . methotrexate (RHEUMATREX) 2.5 MG tablet Take 25mg s once a week on Fridays    . Multiple Vitamin (MULTI-VITAMINS) TABS Take 1 tablet by mouth daily.     . naproxen sodium (ANAPROX) 220 MG tablet Take 440 mg by mouth daily as needed (pain).    . Omega-3 Fatty Acids (FISH OIL PO) Take 1 capsule by mouth daily.     . simvastatin (ZOCOR) 80 MG tablet Take 80mg s at bedtime    . zolpidem (AMBIEN) 10 MG tablet Take 10 mg by mouth at bedtime as needed for sleep.      No current facility-administered medications for this visit.     Review of Systems Review of Systems  Constitutional: Negative.   Respiratory: Negative.   Cardiovascular: Negative.     Blood pressure 132/68,  pulse 74, resp. rate 16, height 5\' 1"  (1.549 m), weight 196 lb (88.9 kg).  Physical Exam Physical Exam  Constitutional: She is oriented to person, place, and time. She appears well-developed and well-nourished.  Abdominal:    Genitourinary:     Musculoskeletal:       Legs: Neurological: She is alert and oriented to person, place, and time.  Skin: Skin is warm and dry.  incisions intact, healing well    Data Reviewed Pathology pending.  Assessment    Melanoma left lower extremity.  Labial skin lesion.    Plan     Return in one week for partial suture removal.      This information has been scribed by Gaspar Cola CMA.   Robert Bellow 11/24/2016, 9:34 AM

## 2016-11-26 ENCOUNTER — Telehealth: Payer: Self-pay | Admitting: *Deleted

## 2016-11-26 LAB — SURGICAL PATHOLOGY

## 2016-11-26 NOTE — Telephone Encounter (Signed)
Let patient know that "all is well" with the biopsy, lymph node was negative and the labia lesion is not cancer but will need to be watched per Dr Bary Castilla. Thanks

## 2016-11-26 NOTE — Addendum Note (Signed)
Addendum  created 11/26/16 [id number] by Iver Nestle, MD   Anesthesia Attestations filed

## 2016-11-26 NOTE — Telephone Encounter (Signed)
Patient called back and was given a verbal on results, she is pleased and understands

## 2016-12-02 ENCOUNTER — Ambulatory Visit (INDEPENDENT_AMBULATORY_CARE_PROVIDER_SITE_OTHER): Payer: Medicare Other

## 2016-12-02 DIAGNOSIS — C439 Malignant melanoma of skin, unspecified: Secondary | ICD-10-CM

## 2016-12-02 NOTE — Progress Notes (Signed)
Patient came in today for a wound check. The wound is clean, with no signs of infection noted. End running sutures removed as well as every other middle suture. Benzoin and steri strips placed.  Follow up as scheduled.

## 2016-12-10 ENCOUNTER — Ambulatory Visit: Payer: No Typology Code available for payment source | Admitting: Cardiology

## 2016-12-11 ENCOUNTER — Ambulatory Visit (INDEPENDENT_AMBULATORY_CARE_PROVIDER_SITE_OTHER): Payer: Medicare Other | Admitting: General Surgery

## 2016-12-11 VITALS — BP 130/74 | HR 78 | Resp 16 | Ht 61.0 in | Wt 197.0 lb

## 2016-12-11 DIAGNOSIS — C439 Malignant melanoma of skin, unspecified: Secondary | ICD-10-CM

## 2016-12-11 DIAGNOSIS — R87623 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): Secondary | ICD-10-CM

## 2016-12-11 NOTE — Progress Notes (Signed)
Patient ID: Erin [name], female   DOB: Jun 28, 1946, 71 y.o.   MRN: [id number]  Chief Complaint  Patient presents with  . Routine Post Op    HPI Erin [name] is a 71 y.o. female here today for her post op melanoma removal done on 11/19/2016. Patient states she is doing well.  HPI  Past Medical History:  Diagnosis Date  . Anemia    h/o  . Cancer ([place])    melanoma  . Dyspnea    with exertion-unable to walk a mile without getting sob- dr sparks set pt up to see Dr Rockey Situ after 11-19-16 surgery  . GERD (gastroesophageal reflux disease)   . Heart murmur    h/o   . Mitral regurgitation   . Pulmonary fibrosis ([place])    per Dr Raul Del  . RA (rheumatoid arthritis) ([place])   . Scleroderma [facility])     Past Surgical History:  Procedure Laterality Date  . ABDOMINAL HYSTERECTOMY    . BREAST CYST EXCISION Left 20+ years ago   No scar visible  . CARPAL TUNNEL RELEASE Bilateral   . EXCISION HYDRADENITIS LABIA Left 11/19/2016   Procedure: EXCISION LABIAL MASS;  Surgeon: Robert Bellow, MD;  Location: [facility] ORS;  Service: General;  Laterality: Left;  . EYE SURGERY Bilateral    cataracts  . HEEL SPUR EXCISION    . MASS EXCISION Left 11/19/2016   Procedure: EXCISION LEFT THIGH MELANOMA;  Surgeon: Robert Bellow, MD;  Location: [facility] ORS;  Service: General;  Laterality: Left;  . SENTINEL NODE BIOPSY Left 11/19/2016   Procedure: INGUINAL SENTINEL NODE BIOPSY;  Surgeon: Robert Bellow, MD;  Location: [facility] ORS;  Service: General;  Laterality: Left;    Family History  Problem Relation Age of Onset  . Endometrial cancer Mother 10  . Bladder Cancer Father   . Cerebral palsy Sister     Social History Social History  Substance Use Topics  . Smoking status: Never Smoker  . Smokeless tobacco: Never Used  . Alcohol use No    Allergies  Allergen Reactions  . Remicade [Infliximab] Shortness Of Breath and Itching  . Fosamax [Alendronate Sodium]     GI Bleed   . Procardia  [Nifedipine] Hives  . Sulfur Swelling  . Wellbutrin [Bupropion] Anxiety    Current Outpatient Prescriptions  Medication Sig Dispense Refill  . ALPRAZolam (XANAX) 0.5 MG tablet Take 0.5mg s twice daily as needed for anxiety    . amLODipine (NORVASC) 5 MG tablet 5 mg bid    . aspirin EC 81 MG tablet Take 81 mg by mouth at bedtime.     . cetirizine (ZYRTEC) 10 MG tablet Take 10 mg by mouth daily as needed for allergies.    . Clobetasol Prop Emollient Base (CLOBETASOL PROPIONATE E) 0.05 % emollient cream Apply 1 application topically 2 (two) times daily as needed (sores).     Marland Kitchen dexlansoprazole (DEXILANT) 60 MG capsule Take 60 mg by mouth at bedtime.     Marland Kitchen ezetimibe (ZETIA) 10 MG tablet Take 1 tablet (10 mg total) by mouth daily. 30 tablet 11  . FLUoxetine (PROZAC) 20 MG capsule 60 mg by mouth every morning    . folic acid (FOLVITE) A999333 MCG tablet Take 400 mcg by mouth daily.     . furosemide (LASIX) 20 MG tablet Take 20mg s daily as needed for swelling    . latanoprost (XALATAN) 0.005 % ophthalmic solution Place 1 drop into both eyes at bedtime.     Marland Kitchen  methotrexate (RHEUMATREX) 2.5 MG tablet Take 25mg s once a week on Fridays    . Multiple Vitamin (MULTI-VITAMINS) TABS Take 1 tablet by mouth daily.     . Omega-3 Fatty Acids (FISH OIL PO) Take 1 capsule by mouth daily.     . simvastatin (ZOCOR) 80 MG tablet Take 80mg s at bedtime    . zolpidem (AMBIEN) 10 MG tablet Take 10 mg by mouth at bedtime as needed for sleep.      No current facility-administered medications for this visit.     Review of Systems Review of Systems  Constitutional: Negative.   Respiratory: Negative.   Cardiovascular: Negative.     Blood pressure 130/74, pulse 78, resp. rate 16, height 5\' 1"  (1.549 m), weight 197 lb (89.4 kg).  Physical Exam Physical Exam  Genitourinary:     Musculoskeletal:       Legs:   Data Reviewed DIAGNOSIS:  A. SKIN AND SOFT TISSUE, LEFT THIGH; EXCISION:  - NEGATIVE FOR RESIDUAL  MELANOMA.  - BIOPSY SITE CHANGES.  - ALL MARGINS APPEAR CLEAR.   B. SENTINEL LYMPH NODE #1, LEFT INGUINAL; EXCISION:  - NEGATIVE FOR MALIGNANCY BY IMMUNOHISTOCHEMISTRY FOR SOX10.   C. LEFT LABIAL NODULE; EXCISION:  - HIGH-GRADE SQUAMOUS INTRAEPITHELIAL LESION (HSIL, VIN 3).   Assessment    Doing well status post excision of melanoma as well as high-grade intraepithelial lesion from the left labia.    Plan    Importance of GYN evaluation was reviewed.    Patient to return in three months for leg check.   Referral to GYN.  This patient has been scheduled for an appointment with Dr. Barnett Applebaum at [place] for 01-07-17 at 2:30 pm.   This information has been scribed by Gaspar Cola CMA.    Robert Bellow 12/16/2016, 10:09 AM

## 2016-12-11 NOTE — Patient Instructions (Signed)
Return in three months.

## 2016-12-15 ENCOUNTER — Ambulatory Visit (INDEPENDENT_AMBULATORY_CARE_PROVIDER_SITE_OTHER): Payer: Medicare Other | Admitting: Cardiovascular Disease

## 2016-12-15 ENCOUNTER — Encounter: Payer: Self-pay | Admitting: Cardiovascular Disease

## 2016-12-15 VITALS — BP 116/70 | HR 71 | Ht 61.0 in | Wt 197.5 lb

## 2016-12-15 DIAGNOSIS — J84112 Idiopathic pulmonary fibrosis: Secondary | ICD-10-CM

## 2016-12-15 DIAGNOSIS — I7300 Raynaud's syndrome without gangrene: Secondary | ICD-10-CM | POA: Diagnosis not present

## 2016-12-15 DIAGNOSIS — I25119 Atherosclerotic heart disease of native coronary artery with unspecified angina pectoris: Secondary | ICD-10-CM | POA: Diagnosis not present

## 2016-12-15 DIAGNOSIS — R0602 Shortness of breath: Secondary | ICD-10-CM | POA: Insufficient documentation

## 2016-12-15 DIAGNOSIS — Z136 Encounter for screening for cardiovascular disorders: Secondary | ICD-10-CM

## 2016-12-15 DIAGNOSIS — M349 Systemic sclerosis, unspecified: Secondary | ICD-10-CM | POA: Diagnosis not present

## 2016-12-15 DIAGNOSIS — J8410 Pulmonary fibrosis, unspecified: Secondary | ICD-10-CM | POA: Diagnosis not present

## 2016-12-15 DIAGNOSIS — I209 Angina pectoris, unspecified: Secondary | ICD-10-CM | POA: Diagnosis not present

## 2016-12-15 MED ORDER — EZETIMIBE 10 MG PO TABS: 10.0000 mg | ORAL_TABLET | Freq: Every day | ORAL | 11 refills | Status: DC

## 2016-12-15 NOTE — Progress Notes (Signed)
Cardiology Office Note  Date:  12/15/2016   ID:  Erin [name], DOB [birth year], MRN [id number]  PCP:  Idelle Crouch, MD   Chief Complaint  Patient presents with  . other     New Patient.  Referral From [facility] Dr.Sparks for Syncope/ Echo 05/2015 at [facility]. Pt states syncope was related to metoprolol and hasn't had any more episodes since stoping that medication. Pt c/o sob.  Reviewed meds with pt verbally.    HPI:  Erin [name] is a very pleasant 71 year old woman with history of Scleroderma, raynauds disease (toes, hands), arthritis, chronic interstitial lung disease at the bases seen on CT scan chest x-ray, who presents by referral from Dr. Doy Hutching for evaluation of her worsening shortness of breath on exertion  She reports that shortness of breath has been getting worse over the past several months, though started several years ago Previously seen by Dr. Raul Del for her lung issues. Was told to follow-up in 6 months. She wonders if there is more that she can do for her symptoms  Very SOB with exertion such as carrying groceries "cant do anything" Started on metoprolol by PMD for her shortness of breath, Had syncope while on the medication,  She stood up, Felt lightheaded while in the bathroom, fell on the bathtub  This occurred approximately 5-6 weeks ago  She reports that on the metoprolol her Pulse was low, "50s, sometimes lower " She stopped the metoprolol since then has felt well with no syncope or near syncope  CXR reviewed with her showing: Chronic interstitial changes are present at the lung bases.  CT scan results and images reviewed with her in the office This shows mid to distal LAD coronary calcifications No significant thoracic descending  or ascending aortic atherosclerosis It does show fibrosis at the bases of the lungs bilaterally perhaps worse on the left than the right  Other medical issues include Melanoma left thigh  Review of previous records shows Echo 10/16:  normal  EF, no significant valve abnormality.  EKG on today's visit shows normal sinus rhythm with rate 71 bpm, no significant ST or T-wave changes  Orthostatics done in the office shows no significant change in blood pressure with standing, there is change in heart rate from 69 beats minute supine up to 86 bpm standing  PMH:   has a past medical history of Anemia; Cancer ([place]); Dyspnea; GERD (gastroesophageal reflux disease); Heart murmur; Mitral regurgitation; Pulmonary fibrosis (HCC); RA (rheumatoid arthritis) ([place]); and Scleroderma ([place]).  PSH:    Past Surgical History:  Procedure Laterality Date  . ABDOMINAL HYSTERECTOMY    . BREAST CYST EXCISION Left 20+ years ago   No scar visible  . CARPAL TUNNEL RELEASE Bilateral   . EXCISION HYDRADENITIS LABIA Left 11/19/2016   Procedure: EXCISION LABIAL MASS;  Surgeon: Robert Bellow, MD;  Location: [facility] ORS;  Service: General;  Laterality: Left;  . EYE SURGERY Bilateral    cataracts  . HEEL SPUR EXCISION    . MASS EXCISION Left 11/19/2016   Procedure: EXCISION LEFT THIGH MELANOMA;  Surgeon: Robert Bellow, MD;  Location: [facility] ORS;  Service: General;  Laterality: Left;  . SENTINEL NODE BIOPSY Left 11/19/2016   Procedure: INGUINAL SENTINEL NODE BIOPSY;  Surgeon: Robert Bellow, MD;  Location: [facility] ORS;  Service: General;  Laterality: Left;    Current Outpatient Prescriptions  Medication Sig Dispense Refill  . ALPRAZolam (XANAX) 0.5 MG tablet Take 0.5mg s twice daily as needed for anxiety    .  amLODipine (NORVASC) 5 MG tablet 5 mg bid    . aspirin EC 81 MG tablet Take 81 mg by mouth at bedtime.     . cetirizine (ZYRTEC) 10 MG tablet Take 10 mg by mouth daily as needed for allergies.    . Clobetasol Prop Emollient Base (CLOBETASOL PROPIONATE E) 0.05 % emollient cream Apply 1 application topically 2 (two) times daily as needed (sores).     Marland Kitchen dexlansoprazole (DEXILANT) 60 MG capsule Take 60 mg by mouth at bedtime.     Marland Kitchen FLUoxetine  (PROZAC) 20 MG capsule 60 mg by mouth every morning    . folic acid (FOLVITE) A999333 MCG tablet Take 400 mcg by mouth daily.     . furosemide (LASIX) 20 MG tablet Take 20mg s daily as needed for swelling    . latanoprost (XALATAN) 0.005 % ophthalmic solution Place 1 drop into both eyes at bedtime.     . methotrexate (RHEUMATREX) 2.5 MG tablet Take 25mg s once a week on Fridays    . Multiple Vitamin (MULTI-VITAMINS) TABS Take 1 tablet by mouth daily.     . Omega-3 Fatty Acids (FISH OIL PO) Take 1 capsule by mouth daily.     . simvastatin (ZOCOR) 80 MG tablet Take 80mg s at bedtime    . zolpidem (AMBIEN) 10 MG tablet Take 10 mg by mouth at bedtime as needed for sleep.     Marland Kitchen ezetimibe (ZETIA) 10 MG tablet Take 1 tablet (10 mg total) by mouth daily. 30 tablet 11   No current facility-administered medications for this visit.      Allergies:   Remicade [infliximab]; Fosamax [alendronate sodium]; Procardia [nifedipine]; Sulfur; and Wellbutrin [bupropion]   Social History:  The patient  reports that she has never smoked. She has never used smokeless tobacco. She reports that she does not drink alcohol or use drugs.   Family History:   family history includes Bladder Cancer in her father; Cerebral palsy in her sister; Endometrial cancer (age of onset: 1) in her mother.    Review of Systems: Review of Systems  Respiratory: Positive for shortness of breath.   Cardiovascular: Negative.   Gastrointestinal: Negative.   Musculoskeletal: Negative.   Neurological: Positive for weakness.  Psychiatric/Behavioral: Negative.   All other systems reviewed and are negative.    PHYSICAL EXAM: VS:  BP 116/70 (BP Location: Right Arm, Patient Position: Sitting, Cuff Size: Large)   Pulse 71   Ht 5\' 1"  (1.549 m)   Wt 197 lb 8 oz (89.6 kg)   BMI 37.32 kg/m  , BMI Body mass index is 37.32 kg/m. GEN: Well nourished, well developed, in no acute distress , obese HEENT: normal  Neck: no JVD, carotid bruits, or  masses Cardiac: RRR; no murmurs, rubs, or gallops,no edema  Respiratory:  clear to auscultation bilaterally, normal work of breathing GI: soft, nontender, nondistended, + BS MS: no deformity or atrophy  Skin: warm and dry, no rash Neuro:  Strength and sensation are intact Psych: euthymic mood, full affect    Recent Labs: No results found for requested labs within last 8760 hours.    Lipid Panel No results found for: CHOL, HDL, LDLCALC, TRIG    Wt Readings from Last 3 Encounters:  12/15/16 197 lb 8 oz (89.6 kg)  12/11/16 197 lb (89.4 kg)  11/24/16 196 lb (88.9 kg)      ASSESSMENT AND PLAN:   Shortness of breath Likely from her underlying fibrosis though unable to exclude underlying coronary disease and anginal  equivalent symptoms. Stress test pending No clear signs of overt heart failure. 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 Unclear if she may benefit from high-resolution CT scanning Unclear if she'll be a candidate for medication in an effort to minimize progression Also unclear if she would benefit from pulmonary rehabilitation. Order has been placed to potentially start therapy after her stress test and after she meets with pulmonary team  Scleroderma [facility]) Followed by rheumatology She reports long history dating back to 2000  Raynaud's disease without gangrene Recommended she wear thick socks, continue calcium channel blockers  [name]  Seen on CT scan chest October 2016 mid to distal LAD region Discussed various treatment options. Given symptoms of shortness of breath, recommended we start with pharmacologic Myoview   Total encounter time more than 45 minutes  Greater than 50% was spent in counseling  and coordination of care with the patient   Disposition:   F/U  6 months   Orders Placed This Encounter  Procedures  . NM Myocar Multi W/Spect W/Wall Motion / EF  . AMB referral to pulmonary rehabilitation  . Ambulatory referral to Pulmonology  . EKG 12-Lead     Signed, Esmond Plants, M.D., Ph.D. 12/15/2016  [place], [place]

## 2016-12-15 NOTE — Patient Instructions (Addendum)
Medication Instructions:   Please start zetia one a day for cholesterol Stay on simvastatin  Labwork:  No new labs needed  Testing/Procedures:  You are scheduled to see Dr. Alva Garnet on Feb 15 @ 11:30  We will schedule a lexiscan myoview in the hospital for coronary disease, shortness of breath.  [place]  Your caregiver has ordered a Stress Test with nuclear imaging. The purpose of this test is to evaluate the blood supply to your heart muscle. This procedure is referred to as a "Non-Invasive Stress Test." This is because other than having an IV started in your vein, nothing is inserted or "invades" your body. Cardiac stress tests are done to find areas of poor blood flow to the heart by determining the extent of coronary artery disease ([name]). Some patients exercise on a treadmill, which naturally increases the blood flow to your heart, while others who are  unable to walk on a treadmill due to physical limitations have a pharmacologic/chemical stress agent called Lexiscan . This medicine will mimic walking on a treadmill by temporarily increasing your coronary blood flow.   Please note: these test may take anywhere between 2-4 hours to complete  PLEASE REPORT TO [place] AT THE FIRST DESK WILL DIRECT YOU WHERE TO GO  Date of Procedure:__Friday, January 12________  Arrival Time for Procedure:___7:15 am___________    Instructions regarding medication:   __X__:  Hold AMLODIPINE the morning of procedure  How to prepare for your Myoview test:  1. Do not eat or drink after midnight 2. No caffeine for 24 hours prior to test 3. No smoking 24 hours prior to test. 4. Your medication may be taken with water.  If your doctor stopped a medication because of this test, do not take that medication. 5. Ladies, please do not wear dresses.  Skirts or pants are appropriate. Please wear a short sleeve shirt. 6. No perfume, cologne or lotion.  We will put  in an order for pulmonary rehab, SOB, pulmonary fibrosis  I recommend watching educational videos on topics of interest to you at:       [email or web address].goemmi.com  Enter code: HEARTCARE    Follow-Up: It was a pleasure seeing you in the office today. Please call us if you have new issues that need to be addressed before your next appt.  [phone]  Your physician wants you to follow-up in: 6 months.  You will receive a reminder letter in the mail two months in advance. If you don't receive a letter, please call our office to schedule the follow-up appointment.  If you need a refill on your cardiac medications before your next appointment, please call your pharmacy.    Pharmacologic Stress Electrocardiogram A pharmacologic stress electrocardiogram is a heart (cardiac) test that uses nuclear imaging to evaluate the blood supply to your heart. This test may also be called a pharmacologic stress electrocardiography. Pharmacologic means that a medicine is used to increase your heart rate and blood pressure.  This stress test is done to find areas of poor blood flow to the heart by determining the extent of coronary artery disease ([name]). Some people exercise on a treadmill, which naturally increases the blood flow to the heart. For those people unable to exercise on a treadmill, a medicine is used. This medicine stimulates your heart and will cause your heart to beat harder and more quickly, as if you were exercising.  Pharmacologic stress tests can help determine:  The adequacy of blood  flow to your heart during increased levels of activity in order to clear you for discharge home.  The extent of coronary artery blockage caused by [name].  Your prognosis if you have suffered a heart attack.  The effectiveness of cardiac procedures done, such as an angioplasty, which can increase the circulation in your coronary arteries.  Causes of chest pain or pressure. LET [facility] CARE PROVIDER KNOW  ABOUT:  Any allergies you have.  All medicines you are taking, including vitamins, herbs, eye drops, creams, and over-the-counter medicines.  Previous problems you or members of your family have had with the use of anesthetics.  Any blood disorders you have.  Previous surgeries you have had.  Medical conditions you have.  Possibility of pregnancy, if this applies.  If you are currently breastfeeding. RISKS AND COMPLICATIONS Generally, this is a safe procedure. However, as with any procedure, complications can occur. Possible complications include:  You develop pain or pressure in the following areas:  Chest.  Jaw or neck.  Between your shoulder blades.  Radiating down your left arm.  Headache.  Dizziness or light-headedness.  Shortness of breath.  Increased or irregular heartbeat.  Low blood pressure.  Nausea or vomiting.  Flushing.  Redness going up the arm and slight pain during injection of medicine.  Heart attack (rare). BEFORE THE PROCEDURE   Avoid all forms of caffeine for 24 hours before your test or as directed by your health care provider. This includes coffee, tea (even decaffeinated tea), caffeinated sodas, chocolate, cocoa, and certain pain medicines.  Follow your health care provider's instructions regarding eating and drinking before the test.  Take your medicines as directed at regular times with water unless instructed otherwise. Exceptions may include:  If you have diabetes, ask how you are to take your insulin or pills. It is common to adjust insulin dosing the morning of the test.  If you are taking beta-blocker medicines, it is important to talk to your health care provider about these medicines well before the date of your test. Taking beta-blocker medicines may interfere with the test. In some cases, these medicines need to be changed or stopped 24 hours or more before the test.  If you wear a nitroglycerin patch, it may need to be  removed prior to the test. Ask your health care provider if the patch should be removed before the test.  If you use an inhaler for any breathing condition, bring it with you to the test.  If you are an outpatient, bring a snack so you can eat right after the stress phase of the test.  Do not smoke for 4 hours prior to the test or as directed by your health care provider.  Do not apply lotions, powders, creams, or oils on your chest prior to the test.  Wear comfortable shoes and clothing. Let your health care provider know if you were unable to complete or follow the preparations for your test. PROCEDURE   Multiple patches (electrodes) will be put on your chest. If needed, small areas of your chest may be shaved to get better contact with the electrodes. Once the electrodes are attached to your body, multiple wires will be attached to the electrodes, and your heart rate will be monitored.  An IV access will be started. A nuclear trace (isotope) is given. The isotope may be given intravenously, or it may be swallowed. Nuclear refers to several types of radioactive isotopes, and the nuclear isotope lights up the arteries so  that the nuclear images are clear. The isotope is absorbed by your body. This results in low radiation exposure.  A resting nuclear image is taken to show how your heart functions at rest.  A medicine is given through the IV access.  A second scan is done about 1 hour after the medicine injection and determines how your heart functions under stress.  During this stress phase, you will be connected to an electrocardiogram machine. Your blood pressure and oxygen levels will be monitored. AFTER THE PROCEDURE   Your heart rate and blood pressure will be monitored after the test.  You may return to your normal schedule, including diet,activities, and medicines, unless your health care provider tells you otherwise. This information is not intended to replace advice given to  you by your health care provider. Make sure you discuss any questions you have with your health care provider. Document Released: 04/12/2009 Document Revised: 11/29/2013 Document Reviewed: 08/01/2013 Elsevier Interactive Patient Education  2017 [employer]. Pharmacologic Stress Electrocardiogram, Care After Refer to this sheet in the next few weeks. These instructions provide you with information on caring for yourself after your procedure. Your health care provider may also give you more specific instructions. Your treatment has been planned according to current medical practices, but problems sometimes occur. Call your health care provider if you have any problems or questions after your procedure. WHAT TO EXPECT AFTER THE PROCEDURE After your procedure, you may have a brief period of:  Shortness of breath.  Dizziness.  Nausea.  Chest pain.  Headache. HOME CARE INSTRUCTIONS  You may return to your normal schedule, including diet, activities, and medicines, unless your health care provider tells you otherwise.   Keep your follow-up appointments as instructed.  You will get rid of the nuclear isotope through your urine within 24-36 hours after your test. Although your exposure to radiation during this test is small, it is recommended that you avoid close contact with young children (such as hugging and cuddling) for 12-18 hours after the test. If you are breastfeeding, do not breastfeed for 24 hours after the test.  If you plan to travel by airplane within 3 days of the test, ask your health care provider for a form to make airport staff aware of the test you had. Airport Psychologist, forensic can sometimes pick up on the isotope used during this test. SEEK MEDICAL CARE IF:   You have signs of a reaction to the medicine used during the test. This may include red, swollen, or itchy skin.  You have persistent or worsening dizziness or light-headedness.  You have a fast or irregular  heartbeat.  You have persistent nausea or vomiting. SEEK IMMEDIATE MEDICAL CARE IF:  You develop pain or pressure in the following areas:  Chest.  Jaw or neck.  Between your shoulder blades.  Radiating down your left arm.  You faint.  You have difficulty breathing. This information is not intended to replace advice given to you by your health care provider. Make sure you discuss any questions you have with your health care provider. Document Released: 09/14/2013 Document Revised: 11/29/2013 Document Reviewed: 09/14/2013 Elsevier Interactive Patient Education  2017 [employer].

## 2016-12-16 ENCOUNTER — Other Ambulatory Visit: Payer: Self-pay | Admitting: *Deleted

## 2016-12-16 DIAGNOSIS — J849 Interstitial pulmonary disease, unspecified: Secondary | ICD-10-CM

## 2016-12-16 DIAGNOSIS — R87623 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): Secondary | ICD-10-CM | POA: Insufficient documentation

## 2016-12-17 ENCOUNTER — Telehealth: Payer: Self-pay | Admitting: Cardiovascular Disease

## 2016-12-17 NOTE — Telephone Encounter (Signed)
Patient having nm stress and has questions . Please  Call.

## 2016-12-17 NOTE — Telephone Encounter (Signed)
Reviewed instructions for stress test scheduled on Friday and she verbalized understanding with no further questions at this time.

## 2016-12-19 ENCOUNTER — Encounter
Admission: RE | Admit: 2016-12-19 | Discharge: 2016-12-19 | Disposition: A | Discharge status: Home / Self Care | Payer: Medicare Other | Source: Ambulatory Visit | Attending: Cardiovascular Disease | Admitting: Cardiovascular Disease

## 2016-12-19 DIAGNOSIS — R0602 Shortness of breath: Secondary | ICD-10-CM | POA: Diagnosis present

## 2016-12-19 DIAGNOSIS — Z136 Encounter for screening for cardiovascular disorders: Secondary | ICD-10-CM | POA: Diagnosis present

## 2016-12-19 LAB — NM MYOCAR MULTI W/SPECT W/WALL MOTION / EF
CHL CUP RESTING HR STRESS: 62 {beats}/min
CSEPHR: 61 %
LV dias vol: 55 mL (ref 46–106)
LVSYSVOL: 14 mL
NUC STRESS TID: 1
Peak HR: 91 {beats}/min
SDS: 3
SRS: 8
SSS: 9

## 2016-12-19 MED ORDER — REGADENOSON 0.4 MG/5ML IV SOLN
0.4000 mg | Freq: Once | INTRAVENOUS | Status: AC
  Administered 2016-12-19: 0.4 mg via INTRAVENOUS
  Filled 2016-12-19: qty 5

## 2016-12-19 MED ORDER — TECHNETIUM TC 99M TETROFOSMIN IV KIT
13.0000 | PACK | Freq: Once | INTRAVENOUS | Status: AC | PRN
  Administered 2016-12-19: 13.41 via INTRAVENOUS

## 2016-12-19 MED ORDER — TECHNETIUM TC 99M TETROFOSMIN IV KIT
33.0000 | PACK | Freq: Once | INTRAVENOUS | Status: AC | PRN
  Administered 2016-12-19: 32.68 via INTRAVENOUS

## 2016-12-25 ENCOUNTER — Ambulatory Visit: Admission: RE | Admit: 2016-12-25 | Payer: Medicare Other | Source: Ambulatory Visit | Admitting: Gastroenterology

## 2016-12-25 ENCOUNTER — Encounter: Admission: RE | Payer: Self-pay | Source: Ambulatory Visit

## 2016-12-25 SURGERY — COLONOSCOPY WITH PROPOFOL
Anesthesia: General

## 2017-01-19 ENCOUNTER — Encounter: Payer: Self-pay | Admitting: *Deleted

## 2017-01-20 ENCOUNTER — Ambulatory Visit
Admission: RE | Admit: 2017-01-20 | Discharge: 2017-01-20 | Disposition: A | Discharge status: Home / Self Care | Payer: Medicare Other | Source: Ambulatory Visit | Attending: Gastroenterology | Admitting: Gastroenterology

## 2017-01-20 ENCOUNTER — Ambulatory Visit: Payer: Medicare Other | Admitting: Anesthesiology

## 2017-01-20 ENCOUNTER — Encounter
Admission: RE | Disposition: A | Discharge status: Home / Self Care | Payer: Self-pay | Source: Ambulatory Visit | Attending: Gastroenterology

## 2017-01-20 ENCOUNTER — Encounter: Payer: Self-pay | Admitting: Anesthesiology

## 2017-01-20 DIAGNOSIS — K228 Other specified diseases of esophagus: Secondary | ICD-10-CM | POA: Insufficient documentation

## 2017-01-20 DIAGNOSIS — Z7982 Long term (current) use of aspirin: Secondary | ICD-10-CM | POA: Diagnosis not present

## 2017-01-20 DIAGNOSIS — I739 Peripheral vascular disease, unspecified: Secondary | ICD-10-CM | POA: Diagnosis not present

## 2017-01-20 DIAGNOSIS — M069 Rheumatoid arthritis, unspecified: Secondary | ICD-10-CM | POA: Insufficient documentation

## 2017-01-20 DIAGNOSIS — K2210 Ulcer of esophagus without bleeding: Secondary | ICD-10-CM | POA: Insufficient documentation

## 2017-01-20 DIAGNOSIS — K5730 Diverticulosis of large intestine without perforation or abscess without bleeding: Secondary | ICD-10-CM | POA: Diagnosis not present

## 2017-01-20 DIAGNOSIS — Q438 Other specified congenital malformations of intestine: Secondary | ICD-10-CM

## 2017-01-20 DIAGNOSIS — I10 Essential (primary) hypertension: Secondary | ICD-10-CM | POA: Insufficient documentation

## 2017-01-20 DIAGNOSIS — Z8719 Personal history of other diseases of the digestive system: Secondary | ICD-10-CM | POA: Insufficient documentation

## 2017-01-20 DIAGNOSIS — I340 Nonrheumatic mitral (valve) insufficiency: Secondary | ICD-10-CM | POA: Insufficient documentation

## 2017-01-20 DIAGNOSIS — E785 Hyperlipidemia, unspecified: Secondary | ICD-10-CM | POA: Insufficient documentation

## 2017-01-20 DIAGNOSIS — Z79899 Other long term (current) drug therapy: Secondary | ICD-10-CM | POA: Diagnosis not present

## 2017-01-20 DIAGNOSIS — D124 Benign neoplasm of descending colon: Secondary | ICD-10-CM | POA: Insufficient documentation

## 2017-01-20 DIAGNOSIS — Z1211 Encounter for screening for malignant neoplasm of colon: Secondary | ICD-10-CM | POA: Insufficient documentation

## 2017-01-20 DIAGNOSIS — D123 Benign neoplasm of transverse colon: Secondary | ICD-10-CM | POA: Diagnosis not present

## 2017-01-20 DIAGNOSIS — K5790 Diverticulosis of intestine, part unspecified, without perforation or abscess without bleeding: Secondary | ICD-10-CM

## 2017-01-20 DIAGNOSIS — R1310 Dysphagia, unspecified: Secondary | ICD-10-CM | POA: Insufficient documentation

## 2017-01-20 DIAGNOSIS — B3781 Candidal esophagitis: Secondary | ICD-10-CM | POA: Diagnosis not present

## 2017-01-20 DIAGNOSIS — D122 Benign neoplasm of ascending colon: Secondary | ICD-10-CM | POA: Diagnosis not present

## 2017-01-20 DIAGNOSIS — I2510 Atherosclerotic heart disease of native coronary artery without angina pectoris: Secondary | ICD-10-CM | POA: Insufficient documentation

## 2017-01-20 DIAGNOSIS — K210 Gastro-esophageal reflux disease with esophagitis: Secondary | ICD-10-CM | POA: Insufficient documentation

## 2017-01-20 DIAGNOSIS — Z7952 Long term (current) use of systemic steroids: Secondary | ICD-10-CM | POA: Diagnosis not present

## 2017-01-20 DIAGNOSIS — D126 Benign neoplasm of colon, unspecified: Secondary | ICD-10-CM

## 2017-01-20 HISTORY — PX: COLONOSCOPY WITH PROPOFOL: SHX5780

## 2017-01-20 HISTORY — DX: Other intervertebral disc degeneration, lumbar region: M51.36

## 2017-01-20 HISTORY — DX: Diverticulosis of intestine, part unspecified, without perforation or abscess without bleeding: K57.90

## 2017-01-20 HISTORY — DX: Other specified disorders of bone density and structure, unspecified site: M85.80

## 2017-01-20 HISTORY — DX: Other intervertebral disc degeneration, lumbar region without mention of lumbar back pain or lower extremity pain: M51.369

## 2017-01-20 HISTORY — DX: Benign neoplasm of colon, unspecified: D12.6

## 2017-01-20 HISTORY — DX: Candidal esophagitis: B37.81

## 2017-01-20 HISTORY — DX: Essential (primary) hypertension: I10

## 2017-01-20 HISTORY — DX: Hyperlipidemia, unspecified: E78.5

## 2017-01-20 HISTORY — DX: Diffuse cystic mastopathy of unspecified breast: N60.19

## 2017-01-20 HISTORY — DX: Dyskinesia of esophagus: K22.4

## 2017-01-20 HISTORY — DX: Raynaud's syndrome without gangrene: I73.00

## 2017-01-20 HISTORY — DX: Atherosclerotic heart disease of native coronary artery without angina pectoris: I25.10

## 2017-01-20 HISTORY — PX: ESOPHAGOGASTRODUODENOSCOPY (EGD) WITH PROPOFOL: SHX5813

## 2017-01-20 HISTORY — DX: Dysphagia, unspecified: R13.10

## 2017-01-20 HISTORY — DX: Other specified congenital malformations of intestine: Q43.8

## 2017-01-20 LAB — KOH PREP

## 2017-01-20 SURGERY — COLONOSCOPY WITH PROPOFOL
Anesthesia: General

## 2017-01-20 MED ORDER — PROPOFOL 500 MG/50ML IV EMUL
INTRAVENOUS | Status: AC
  Filled 2017-01-20: qty 50

## 2017-01-20 MED ORDER — LIDOCAINE 2% (20 MG/ML) 5 ML SYRINGE
INTRAMUSCULAR | Status: DC | PRN
  Administered 2017-01-20: 40 mg via INTRAVENOUS

## 2017-01-20 MED ORDER — FENTANYL CITRATE (PF) 100 MCG/2ML IJ SOLN
INTRAMUSCULAR | Status: DC | PRN
  Administered 2017-01-20: 50 ug via INTRAVENOUS

## 2017-01-20 MED ORDER — PROPOFOL 500 MG/50ML IV EMUL
INTRAVENOUS | Status: DC | PRN
  Administered 2017-01-20: 140 ug/kg/min via INTRAVENOUS

## 2017-01-20 MED ORDER — PROPOFOL 10 MG/ML IV BOLUS
INTRAVENOUS | Status: DC | PRN
  Administered 2017-01-20: 100 mg via INTRAVENOUS

## 2017-01-20 MED ORDER — BUTAMBEN-TETRACAINE-BENZOCAINE 2-2-14 % EX AERO
INHALATION_SPRAY | CUTANEOUS | Status: AC
  Filled 2017-01-20: qty 20

## 2017-01-20 MED ORDER — MIDAZOLAM HCL 5 MG/5ML IJ SOLN
INTRAMUSCULAR | Status: DC | PRN
  Administered 2017-01-20: 1 mg via INTRAVENOUS

## 2017-01-20 MED ORDER — PHENYLEPHRINE HCL 10 MG/ML IJ SOLN
INTRAMUSCULAR | Status: DC | PRN
  Administered 2017-01-20 (×2): 100 ug via INTRAVENOUS

## 2017-01-20 MED ORDER — FENTANYL CITRATE (PF) 100 MCG/2ML IJ SOLN
INTRAMUSCULAR | Status: AC
  Filled 2017-01-20: qty 2

## 2017-01-20 MED ORDER — SODIUM CHLORIDE 0.9 % IV SOLN
INTRAVENOUS | Status: DC
  Administered 2017-01-20: 09:00:00 via INTRAVENOUS

## 2017-01-20 MED ORDER — MIDAZOLAM HCL 2 MG/2ML IJ SOLN
INTRAMUSCULAR | Status: AC
  Filled 2017-01-20: qty 2

## 2017-01-20 NOTE — Op Note (Addendum)
[facility] Gastroenterology Patient Name: Erin [name] Procedure Date: 01/20/2017 9:28 AM MRN: [id number] Account #: [id number] Date of Birth: May 08, 1946 Admit Type: Outpatient Age: 71 Room: [facility] ENDO ROOM 3 Gender: Female Note Status: Finalized Procedure:            Upper GI endoscopy Indications:          Dysphagia Providers:            Lollie Sails, MD Referring MD:         [name]. Doy Hutching, MD (Referring MD) Medicines:            Monitored Anesthesia Care Complications:        No immediate complications. Procedure:            Pre-Anesthesia Assessment:                       - ASA Grade Assessment: III - A patient with severe                        systemic disease.                       After obtaining informed consent, the endoscope was                        passed under direct vision. Throughout the procedure,                        the patient's blood pressure, pulse, and oxygen                        saturations were monitored continuously. The Endoscope                        was introduced through the mouth, and advanced to the                        third part of duodenum. The upper GI endoscopy was                        accomplished without difficulty. The patient tolerated                        the procedure well. Findings:      Diffuse candidiasis was found in the entire esophagus. Cells for       cytology were obtained by brushing.      A single 10 mm polyp with no bleeding was found at the GE junction. This       was a smooth though very erythematous lesion, not firm to palpation.       This would intermittantly obstruct the lumen, though I was able to take       the scope around it into the gastric vault without difficulty. A small       erosion was noted on the sesion. A small biopsy was taken though this is       very friable.I watched this until hemostasis occured.      -The scope was taken into the gastric vault without difficulty  There was       minimal erythema in the gastric body. Several attempts were taken to  evaluate the upper body and fundus, however this could not be fully       evaluated due to not retaining insufflation.      The examined duodenum was normal. Impression:           - Monilial esophagitis. Cells for cytology obtained.                       - Esophageal polyp(s) were found. I am concerned this                        could be a vascular lesion versusdiverticulum versus                        prolapse type polyp versus other atypical lesion.                       - Normal examined duodenum. Recommendation:       - Perform an upper endoscopic ultrasound (UEUS) at                        appointment to be scheduled.                       - Full liquid diet today, then advance as tolerated to                        low residue diet.                       - Continue present medications.                       - Await pathology results.                       - Diflucan (fluconazole) 100 mg PO daily for 5 days. Procedure Code(s):    --- Professional ---                       [phone], Esophagogastroduodenoscopy, flexible, transoral;                        diagnostic, including collection of specimen(s) by                        brushing or washing, when performed (separate procedure) Diagnosis Code(s):    --- Professional ---                       B37.81, Candidal esophagitis                       K22.8, Other specified diseases of esophagus                       R13.10, Dysphagia, unspecified CPT copyright 2016 American Medical Association. All rights reserved. The codes documented in this report are preliminary and upon coder review may  be revised to meet current compliance requirements. Lollie Sails, MD 01/20/2017 10:12:15 AM This report has been signed electronically. Number of Addenda: 0 Note Initiated On: 01/20/2017 9:28 AM      [facility]

## 2017-01-20 NOTE — Anesthesia Preprocedure Evaluation (Signed)
Anesthesia Evaluation  Patient identified by MRN, date of birth, ID band Patient awake    Reviewed: Allergy & Precautions, H&P , NPO status , Patient's Chart, lab work & pertinent test results, reviewed documented beta blocker date and time   Airway Mallampati: II  TM Distance: >3 FB Neck ROM: full    Dental  (+) Teeth Intact   Pulmonary neg pulmonary ROS, shortness of breath and with exertion,  Pulmonary fibrosis   Pulmonary exam normal        Cardiovascular Exercise Tolerance: Good hypertension, + [name] and + Peripheral Vascular Disease  negative cardio ROS Normal cardiovascular exam+ Valvular Problems/Murmurs MR  Rate:Normal  raynauds   Neuro/Psych negative neurological ROS  negative psych ROS   GI/Hepatic negative GI ROS, Neg liver ROS, GERD  Medicated and Controlled,dysphagia   Endo/Other  negative endocrine ROS  Renal/GU negative Renal ROS  negative genitourinary   Musculoskeletal  (+) Arthritis , Osteoarthritis and Rheumatoid disorders,    Abdominal   Peds  Hematology negative hematology ROS (+) anemia ,   Anesthesia Other Findings   Reproductive/Obstetrics negative OB ROS                             Anesthesia Physical  Anesthesia Plan  ASA: III  Anesthesia Plan: General   Post-op Pain Management:    Induction: Intravenous  Airway Management Planned: Nasal Cannula  Additional Equipment:   Intra-op Plan:   Post-operative Plan:   Informed Consent: I have reviewed the patients History and Physical, chart, labs and discussed the procedure including the risks, benefits and alternatives for the proposed anesthesia with the patient or authorized representative who has indicated his/her understanding and acceptance.     Plan Discussed with: CRNA and Surgeon  Anesthesia Plan Comments:         Anesthesia Quick Evaluation

## 2017-01-20 NOTE — Anesthesia Postprocedure Evaluation (Signed)
Anesthesia Post Note  Patient: Erin [name]  Procedure(s) Performed: Procedure(s) (LRB): COLONOSCOPY WITH PROPOFOL (N/A) ESOPHAGOGASTRODUODENOSCOPY (EGD) WITH PROPOFOL (N/A)  Patient location during evaluation: PACU Anesthesia Type: General Level of consciousness: awake and alert and oriented Pain management: pain level controlled Vital Signs Assessment: post-procedure vital signs reviewed and stable Respiratory status: spontaneous breathing Cardiovascular status: blood pressure returned to baseline Anesthetic complications: no     Last Vitals:  Vitals:   01/20/17 1100 01/20/17 1110  BP: (!) 122/48 128/72  Pulse:    Resp: (!) 22 20  Temp:      Last Pain:  Vitals:   01/20/17 1050  TempSrc: Tympanic                 Taniqua Issa

## 2017-01-20 NOTE — Transfer of Care (Signed)
Immediate Anesthesia Transfer of Care Note  Patient: Erin [name]  Procedure(s) Performed: Procedure(s): COLONOSCOPY WITH PROPOFOL (N/A) ESOPHAGOGASTRODUODENOSCOPY (EGD) WITH PROPOFOL (N/A)  Patient Location: PACU and Endoscopy Unit  Anesthesia Type:General  Level of Consciousness: awake and patient cooperative  Airway & Oxygen Therapy: Patient Spontanous Breathing and Patient connected to nasal cannula oxygen  Post-op Assessment: Report given to RN and Post -op Vital signs reviewed and stable  Post vital signs: Reviewed and stable  Last Vitals:  Vitals:   01/20/17 0852  BP: (!) 148/64  Pulse: 96  Resp: 18  Temp: 36.7 C    Last Pain:  Vitals:   01/20/17 0852  TempSrc: Tympanic         Complications: No apparent anesthesia complications

## 2017-01-20 NOTE — Op Note (Signed)
[facility] Patient Name: Erin [name] Procedure Date: 01/20/2017 9:28 AM MRN: [id number] Account #: [id number] Date of Birth: 11/13/1946 Admit Type: Outpatient Age: 71 Room: [facility] ENDO ROOM 3 Gender: Female Note Status: Finalized Procedure:            Colonoscopy Indications:          Personal history of colonic polyps Providers:            Lollie Sails, MD Referring MD:         [name]. Doy Hutching, MD (Referring MD) Medicines:            Monitored Anesthesia Care Complications:        No immediate complications. Procedure:            Pre-Anesthesia Assessment:                       - ASA Grade Assessment: III - A patient with severe                        systemic disease.                       After obtaining informed consent, the colonoscope was                        passed under direct vision. Throughout the procedure,                        the patient's blood pressure, pulse, and oxygen                        saturations were monitored continuously. The                        Colonoscope was introduced through the anus and                        advanced to the the cecum, identified by appendiceal                        orifice and ileocecal valve. The colonoscopy was                        unusually difficult due to multiple diverticula in the                        colon and a tortuous colon. Successful completion of                        the procedure was aided by changing the patient to a                        supine position and using manual pressure. The patient                        tolerated the procedure well. The quality of the bowel                        preparation was fair. Findings:      Many small and large-mouthed diverticula were found  in the sigmoid       colon, descending colon, splenic flexure and transverse colon.      The sigmoid colon was significantly redundant.      A 4 mm polyp was found in the descending  colon. The polyp was sessile.       The polyp was removed with a cold snare. Resection and retrieval were       complete.      A 2 mm polyp was found in the proximal ascending colon. The polyp was       sessile. The polyp was removed with a cold biopsy forceps. Resection and       retrieval were complete.      A 2 mm polyp was found in the hepatic flexure. The polyp was flat. The       polyp was removed with a cold biopsy forceps. Resection and retrieval       were complete.      The digital rectal exam was normal. Impression:           - Preparation of the colon was fair.                       - Diverticulosis in the sigmoid colon, in the                        descending colon, at the splenic flexure and in the                        transverse colon.                       - Redundant colon.                       - One 4 mm polyp in the descending colon, removed with                        a cold snare. Resected and retrieved.                       - One 2 mm polyp in the proximal ascending colon,                        removed with a cold biopsy forceps. Resected and                        retrieved.                       - One 2 mm polyp at the hepatic flexure, removed with a                        cold biopsy forceps. Resected and retrieved. Recommendation:       - Discharge patient to home. Procedure Code(s):    --- Professional ---                       [phone], Colonoscopy, flexible; with removal of tumor(s),                        polyp(s), or other lesion(s) by snare technique  [id number], 88, Colonoscopy, flexible; with biopsy, single                        or multiple Diagnosis Code(s):    --- Professional ---                       D12.4, Benign neoplasm of descending colon                       D12.2, Benign neoplasm of ascending colon                       D12.3, Benign neoplasm of transverse colon (hepatic                        flexure or splenic  flexure)                       Z86.010, Personal history of colonic polyps                       K57.30, Diverticulosis of large intestine without                        perforation or abscess without bleeding                       Q43.8, Other specified congenital malformations of                        intestine CPT copyright 2016 American Medical Association. All rights reserved. The codes documented in this report are preliminary and upon coder review may  be revised to meet current compliance requirements. Lollie Sails, MD 01/20/2017 10:50:42 AM This report has been signed electronically. Number of Addenda: 0 Note Initiated On: 01/20/2017 9:28 AM Scope Withdrawal Time: 0 hours 7 minutes 21 seconds  Total Procedure Duration: 0 hours 30 minutes 30 seconds       [facility]

## 2017-01-20 NOTE — H&P (Signed)
Outpatient short stay form Pre-procedure 01/20/2017 9:24 AM Erin Sails MD  Primary Physician: Dr. Fulton Reek  Reason for visit:  EGD and colonoscopy  History of present illness:  Erin [name] is a 71 year old female presenting today as above. Erin [name] tolerated her prep well. She does take 81 mg aspirin but has held that for several days. She takes no other aspirin products or blood thinning agents.  Erin [name] also has a history of marked rheumatoid arthritis as well as scleroderma. She shows pronounced changes in her hands with this. 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 She had a barium swallow done on 11/05/2016 showing a dilated thoracic esophagus with decreased peristalsis and debris noted in the esophagus. There is no obstructing lesion noted. He does have a small sliding hiatal hernia. There was some mild reflux. Erin [name] does report having a lot of reflux issues. She has been taking her proton pump inhibitor before bedtime which is likely affecting the effectivity of it. We discussed several issues in this regard.    Current Facility-Administered Medications:  .  0.9 %  sodium chloride infusion, , Intravenous, Continuous, Erin Sails, MD .  0.9 %  sodium chloride infusion, , Intravenous, Continuous, Erin Sails, MD  Prescriptions Prior to Admission  Medication Sig Dispense Refill Last Dose  . ALPRAZolam (XANAX) 0.5 MG tablet Take 0.5mg s twice daily as needed for anxiety   Past Week at Unknown time  . amLODipine (NORVASC) 5 MG tablet 5 mg bid   01/19/2017 at Unknown time  . aspirin EC 81 MG tablet Take 81 mg by mouth at bedtime.    01/15/2017 at Unknown time  . Clobetasol Prop Emollient Base (CLOBETASOL PROPIONATE E) 0.05 % emollient cream Apply 1 application topically 2 (two) times daily as needed (sores).    01/20/2017 at Unknown time  . latanoprost (XALATAN) 0.005 % ophthalmic solution Place 1 drop into both eyes at bedtime.    01/18/2017 at Unknown time  . predniSONE  (DELTASONE) 5 MG tablet Take 5 mg by mouth daily with breakfast.   01/19/2017  . simvastatin (ZOCOR) 80 MG tablet Take 80mg s at bedtime   01/19/2017 at Unknown time  . zolpidem (AMBIEN) 10 MG tablet Take 10 mg by mouth at bedtime as needed for sleep.    01/19/2017 at Unknown time  . cetirizine (ZYRTEC) 10 MG tablet Take 10 mg by mouth daily as needed for allergies.   01/19/2017  . dexlansoprazole (DEXILANT) 60 MG capsule Take 60 mg by mouth at bedtime.    01/18/2017  . ezetimibe (ZETIA) 10 MG tablet Take 1 tablet (10 mg total) by mouth daily. 30 tablet 11 01/18/2017  . FLUoxetine (PROZAC) 20 MG capsule 60 mg by mouth every morning   01/19/2017  . folic acid (FOLVITE) [id number] MCG tablet Take 400 mcg by mouth daily.    01/19/2017  . furosemide (LASIX) 20 MG tablet Take 20mg s daily as needed for swelling   Taking  . methotrexate (RHEUMATREX) 2.5 MG tablet Take 25mg s once a week on Fridays   01/16/2017  . Multiple Vitamin (MULTI-VITAMINS) TABS Take 1 tablet by mouth daily.    01/15/2017  . Omega-3 Fatty Acids (FISH OIL PO) Take 1 capsule by mouth daily.    01/15/2017     Allergies  Allergen Reactions  . Remicade [Infliximab] Shortness Of Breath and Itching  . Actonel [Risedronate Sodium] Other (See Comments)  . Fosamax [Alendronate Sodium]     GI Bleed   . Procardia [Nifedipine] Hives  . Sulfur  Swelling  . Wellbutrin [Bupropion] Anxiety     Past Medical History:  Diagnosis Date  . Anemia    h/o  . Cancer ([place])    melanoma  . Coronary artery disease   . DDD (degenerative disc disease), lumbar   . Dysphagia   . Dyspnea    with exertion-unable to walk a mile without getting sob- dr sparks set pt up to see Dr Rockey Situ after 11-19-16 surgery  . Esophageal dysmotility   . Fibrocystic breast disease   . GERD (gastroesophageal reflux disease)   . Heart murmur    h/o   . Hyperlipidemia   . Hypertension   . Mitral regurgitation   . Osteopenia   . Pulmonary fibrosis ([place])    per Dr Raul Del  . RA  (rheumatoid arthritis) ([place])   . Raynaud's disease   . Scleroderma ([place])     Review of systems:      Physical Exam    Heart and lungs: Regular rate and rhythm without rub or gallop, lungs are bilaterally clear.    HEENT: Normocephalic atraumatic eyes are anicteric    Other:     Pertinant exam for procedure: Soft nontender nondistended bowel sounds positive normoactive.    Planned proceedures: EGD, colonoscopy, and indicated procedures. I have discussed the risks benefits and complications of procedures to include not limited to bleeding, infection, perforation and the risk of sedation and the Erin [name] wishes to proceed.    Erin Sails, MD Gastroenterology 01/20/2017  9:24 AM

## 2017-01-20 NOTE — Anesthesia Post-op Follow-up Note (Cosign Needed)
Anesthesia QCDR form completed.        

## 2017-01-21 ENCOUNTER — Ambulatory Visit: Payer: Medicare Other

## 2017-01-21 ENCOUNTER — Telehealth: Payer: Self-pay

## 2017-01-21 ENCOUNTER — Encounter: Payer: Self-pay | Admitting: Gastroenterology

## 2017-01-21 LAB — SURGICAL PATHOLOGY

## 2017-01-21 NOTE — Telephone Encounter (Signed)
  Oncology Nurse Navigator Documentation Received referral from Dr. Gustavo Lah for upper EUS to evaluate esophageal lesion. Spoke with Faythe Dingwall at Dr. Lenis Noon and the date of 02/12/17 is adequate. Scheduled for 02/12/17 with Dr. Cephas Darby at [facility]. Went over instructions with her including to stop asa for 5 days prior if not taking for medical reason.Copy of instructions mailed to home address along with my contact information for any future questions or needs.  INSTRUCTIONS FOR ENDOSCOPIC ULTRASOUND -Your procedure has been scheduled for March 8th with Dr. Cephas Darby at [facility]. -The hospital will contact you to pre-register over the phone.  -To get your scheduled arrival time, please call the Endoscopy unit at  [phone] between 1-3 p.m. on:  March 7th   -ON THE DAY OF YOU PROCEDURE:   1. If you are scheduled for a morning procedure, nothing to drink after midnight  -If you are scheduled for an afternoon procedure, you may have clear liquids until 5 hours prior  to the procedure but no carbonated drinks or broth  2. NO FOOD THE DAY OF YOUR PROCEDURE  3. You may take your heart, seizure, blood pressure, Parkinson's or breathing medications at  6am with just enough water to get your pills down  4. Do not take any oral Diabetic medications the morning of your procedure.  5. If you are a diabetic and are using insulin, please notify your prescribing physician of this  procedure as your dose may need to be altered related to not being able to eat or drink.   5. Do not take vitamins or fish oil for 5 days before your procedure     -On the day of your procedure, come to the [facility] Admitting/Registration desk (First desk on the right) at the scheduled arrival time. You MUST have someone drive you home from your procedure. You must have a responsible adult with a valid driver's license who is on site throughout your entire procedure and who can stay with you for several  hours after your procedure. You may not go home alone in a taxi, shuttle [place] or bus, as the drivers will not be responsible for you.  --If you have any questions please call me at the above contact Navigator Location: [facility] (01/21/17 1000)   )Navigator Encounter Type: Telephone (01/21/17 1000) Telephone: Lahoma Crocker Call (01/21/17 1000)                       Barriers/Navigation Needs: Coordination of Care (01/21/17 1000)   Interventions: Coordination of Care (01/21/17 1000)   Coordination of Care: EUS (01/21/17 1000)        Acuity: Level 2 (01/21/17 1000)   Acuity Level 2: Initial guidance, education and coordination as needed;Educational needs;Ongoing guidance and education throughout treatment as needed (01/21/17 1000)     Time Spent with Patient: 45 (01/21/17 1000)

## 2017-01-22 ENCOUNTER — Institutional Professional Consult (permissible substitution): Payer: Medicare Other | Admitting: Internal Medicine

## 2017-01-23 ENCOUNTER — Telehealth: Payer: Self-pay

## 2017-01-23 ENCOUNTER — Other Ambulatory Visit: Payer: Self-pay

## 2017-01-23 NOTE — Telephone Encounter (Signed)
  Oncology Nurse Navigator Documentation An earlier appt for EUS became available. [name] was notified and offered earlier date. She accepted. EUS changed from 3/8 to 2/22 at [place]. Notified [facility] of change in date. Navigator Location: [facility] (01/23/17 1100)   )Navigator Encounter Type: Telephone (01/23/17 1100) Telephone: Lahoma Crocker Call;Appt Confirmation/Clarification (01/23/17 1100)                               Coordination of Care: EUS (01/23/17 1100)                  Time Spent with Patient: 30 (01/23/17 1100)

## 2017-01-28 ENCOUNTER — Encounter: Payer: Self-pay | Admitting: *Deleted

## 2017-01-28 ENCOUNTER — Inpatient Hospital Stay: Payer: Medicare Other | Attending: Obstetrics and Gynecology | Admitting: Obstetrics and Gynecology

## 2017-01-28 VITALS — BP 133/70 | HR 74 | Temp 98.1°F | Resp 18 | Ht 62.6 in | Wt 197.3 lb

## 2017-01-28 DIAGNOSIS — I340 Nonrheumatic mitral (valve) insufficiency: Secondary | ICD-10-CM | POA: Diagnosis not present

## 2017-01-28 DIAGNOSIS — E785 Hyperlipidemia, unspecified: Secondary | ICD-10-CM | POA: Diagnosis not present

## 2017-01-28 DIAGNOSIS — J84112 Idiopathic pulmonary fibrosis: Secondary | ICD-10-CM | POA: Insufficient documentation

## 2017-01-28 DIAGNOSIS — M8580 Other specified disorders of bone density and structure, unspecified site: Secondary | ICD-10-CM | POA: Diagnosis not present

## 2017-01-28 DIAGNOSIS — I7300 Raynaud's syndrome without gangrene: Secondary | ICD-10-CM | POA: Insufficient documentation

## 2017-01-28 DIAGNOSIS — M069 Rheumatoid arthritis, unspecified: Secondary | ICD-10-CM

## 2017-01-28 DIAGNOSIS — Z79899 Other long term (current) drug therapy: Secondary | ICD-10-CM | POA: Diagnosis not present

## 2017-01-28 DIAGNOSIS — Z90710 Acquired absence of both cervix and uterus: Secondary | ICD-10-CM

## 2017-01-28 DIAGNOSIS — M5136 Other intervertebral disc degeneration, lumbar region: Secondary | ICD-10-CM | POA: Diagnosis not present

## 2017-01-28 DIAGNOSIS — Z85820 Personal history of malignant melanoma of skin: Secondary | ICD-10-CM | POA: Diagnosis not present

## 2017-01-28 DIAGNOSIS — Z7952 Long term (current) use of systemic steroids: Secondary | ICD-10-CM | POA: Diagnosis not present

## 2017-01-28 DIAGNOSIS — Z7982 Long term (current) use of aspirin: Secondary | ICD-10-CM | POA: Diagnosis not present

## 2017-01-28 DIAGNOSIS — I10 Essential (primary) hypertension: Secondary | ICD-10-CM | POA: Diagnosis not present

## 2017-01-28 DIAGNOSIS — N903 Dysplasia of vulva, unspecified: Secondary | ICD-10-CM | POA: Diagnosis present

## 2017-01-28 DIAGNOSIS — I2510 Atherosclerotic heart disease of native coronary artery without angina pectoris: Secondary | ICD-10-CM | POA: Diagnosis not present

## 2017-01-28 DIAGNOSIS — K219 Gastro-esophageal reflux disease without esophagitis: Secondary | ICD-10-CM | POA: Diagnosis not present

## 2017-01-28 DIAGNOSIS — M349 Systemic sclerosis, unspecified: Secondary | ICD-10-CM | POA: Diagnosis not present

## 2017-01-28 NOTE — Progress Notes (Signed)
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 female s/p TAH who is seen in consultation from Dr. Kenton Kingfisher for [facility]. Dr. Bary Castilla initially referred Erin [name] to Dr. Kenton Kingfisher. The patient underwent left leg wide excision with sentinel node biopsy for melanoma and excision left labial nodule on 11/19/2016. Based on pathology report the vulvar nodule was 1 x 0.8 cm and the entire specimen removed was 1.5 x 0.9 x 0.3 cm. She complains of persistent irritation/itching and has been using clobesetrol ointment for years.    Data Reviewed DIAGNOSIS:  A. SKIN AND SOFT TISSUE, LEFT THIGH; EXCISION:  - NEGATIVE FOR RESIDUAL MELANOMA.  - BIOPSY SITE CHANGES.  - ALL MARGINS APPEAR CLEAR.   B. SENTINEL LYMPH NODE #1, LEFT INGUINAL; EXCISION:  - NEGATIVE FOR MALIGNANCY BY IMMUNOHISTOCHEMISTRY FOR SOX10.   C. LEFT LABIAL NODULE; EXCISION:  - HIGH-GRADE SQUAMOUS INTRAEPITHELIAL LESION (HSIL, VIN 3).   Comment:  In part C, there is HSIL 0.5 mm from a lateral margin. HSIL is present  in one tip section, so this margin is very close also. Complete  gynecologic exam for other HPV-associated lesions and close follow-up  are advised.  She recently had colonoscopy and EGD on 01/20/2017; pathology colonic polyps and GE junction lesion inflamed granulation tissue Barrett's esophagus can not be excluded. Noted to have esophageal lesion and endoscopic ultrasound ordered on 01/29/2017.  Of note she does not have a history of abnormal Pap, genital warts, or other gynecologic related issues.   Problem List: Patient Active Problem List   Diagnosis Date Noted  . 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 Screening for cardiovascular condition 12/15/2016  . Shortness of breath 12/15/2016  .  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 Scleroderma ([place]) 12/15/2016  . Raynaud's disease without gangrene 12/15/2016  . Melanoma of skin ([place]) 11/11/2016  . Lesion of labia 11/11/2016    Past Medical History: Past Medical History:  Diagnosis Date  . Anemia    h/o  . Cancer ([place])    melanoma  . Coronary artery disease   . DDD (degenerative disc disease), lumbar   . Dysphagia   . Dyspnea    with exertion-unable to walk a mile without getting sob- dr sparks set pt up to see Dr Rockey Situ after 11-19-16 surgery  . Esophageal dysmotility   . Fibrocystic breast disease   . GERD (gastroesophageal reflux disease)   . Heart murmur    h/o   . Hyperlipidemia   . Hypertension   . Mitral regurgitation   . Osteopenia   . Pulmonary fibrosis ([place])    per Dr Raul Del  . RA (rheumatoid arthritis) ([place])   . Raynaud's disease   . Scleroderma [facility])     Past Surgical History: Past Surgical History:  Procedure Laterality Date  . ABDOMINAL HYSTERECTOMY    . BREAST CYST EXCISION Left 20+ years ago   No scar visible  . CARPAL TUNNEL RELEASE Bilateral   . COLONOSCOPY    . COLONOSCOPY WITH PROPOFOL N/A 01/20/2017   Procedure: COLONOSCOPY WITH PROPOFOL;  Surgeon: Lollie Sails, MD;  Location: [facility] ENDOSCOPY;  Service: Endoscopy;  Laterality: N/A;  . ESOPHAGOGASTRODUODENOSCOPY    . ESOPHAGOGASTRODUODENOSCOPY (EGD) WITH PROPOFOL N/A 01/20/2017   Procedure: ESOPHAGOGASTRODUODENOSCOPY (EGD) WITH PROPOFOL;  Surgeon: Lollie Sails, MD;  Location: [facility] ENDOSCOPY;  Service: Endoscopy;  Laterality: N/A;  . EXCISION HYDRADENITIS LABIA Left 11/19/2016   Procedure: EXCISION LABIAL MASS;  Surgeon: Robert Bellow, MD;  Location: [facility] ORS;  Service: General;  Laterality: Left;  . EYE SURGERY Bilateral    cataracts  . HEEL SPUR EXCISION    . MASS EXCISION Left 11/19/2016   Procedure: EXCISION LEFT THIGH MELANOMA;  Surgeon: Robert Bellow, MD;  Location: [facility] ORS;   Service: General;  Laterality: Left;  . SENTINEL NODE BIOPSY Left 11/19/2016   Procedure: INGUINAL SENTINEL NODE BIOPSY;  Surgeon: Robert Bellow, MD;  Location: [facility] ORS;  Service: General;  Laterality: Left;    Past Gynecologic History:  As per HPI  OB History:  OB History  Gravida Para Term Preterm AB Living  3 3          SAB TAB Ectopic Multiple Live Births               # Outcome Date GA Lbr Len/2nd Weight Sex Delivery Anes PTL Lv  3 Para           2 Para           1 Para               Family History: Family History  Problem Relation Age of Onset  . Endometrial cancer Mother 66  . Bladder Cancer Father   . Cerebral palsy Sister     Social History: Social History   Social History  . Marital status: Widowed    Spouse name: N/A  . Number of children: N/A  . Years of education: N/A   Occupational History  . retired    Social History Main Topics  . Smoking status: Never Smoker  . Smokeless tobacco: Never Used  . Alcohol use No  . Drug use: No  . Sexual activity: Not on file   Other Topics Concern  . Not on file   Social History Narrative  . No narrative on file    Allergies: Allergies  Allergen Reactions  . Remicade [Infliximab] Shortness Of Breath and Itching  . Actonel [Risedronate Sodium] Other (See Comments)  . Fosamax [Alendronate Sodium]     GI Bleed   . Procardia [Nifedipine] Hives  . Sulfur Swelling  . Wellbutrin [Bupropion] Anxiety    Current Medications: Current Outpatient Prescriptions  Medication Sig Dispense Refill  . ALPRAZolam (XANAX) 0.5 MG tablet Take 0.5mg s twice daily as needed for anxiety    . amLODipine (NORVASC) 5 MG tablet 5 mg bid    . aspirin EC 81 MG tablet Take 81 mg by mouth at bedtime.     . cetirizine (ZYRTEC) 10 MG tablet Take 10 mg by mouth daily as needed for allergies.    . Clobetasol Prop Emollient Base (CLOBETASOL PROPIONATE E) 0.05 % emollient cream Apply 1 application topically 2 (two) times daily as  needed (sores).     Marland Kitchen dexlansoprazole (DEXILANT) 60 MG capsule Take 60 mg by mouth at bedtime.     Marland Kitchen ezetimibe (ZETIA) 10 MG tablet Take 1 tablet (10 mg total) by mouth daily. 30 tablet 11  . FLUoxetine (PROZAC) 20 MG capsule 60 mg by mouth every morning    . folic acid (FOLVITE) A999333 MCG tablet Take 400 mcg by mouth daily.     . furosemide (LASIX) 20 MG tablet Take 20mg s daily as needed for swelling    . latanoprost (XALATAN) 0.005 %  ophthalmic solution Place 1 drop into both eyes at bedtime.     . methotrexate (RHEUMATREX) 2.5 MG tablet Take 25mg s once a week on Fridays    . Multiple Vitamin (MULTI-VITAMINS) TABS Take 1 tablet by mouth daily.     . Omega-3 Fatty Acids (FISH OIL PO) Take 1 capsule by mouth daily.     . predniSONE (DELTASONE) 5 MG tablet Take 5 mg by mouth daily with breakfast.    . simvastatin (ZOCOR) 80 MG tablet Take 80mg s at bedtime    . zolpidem (AMBIEN) 10 MG tablet Take 10 mg by mouth at bedtime as needed for sleep.      No current facility-administered medications for this visit.     Review of Systems General: negative for, fevers, fatigue, changes in weight or appetite Skin: recent surgery for melanoma and VIN Eyes: negative for, changes in vision HEENT: negative for, change in hearing, tinnitus, voice changes Pulmonary: negative for, dyspnea, productive cough Cardiac: negative for, palpitations, pain Gastrointestinal: negative for, nausea, vomiting, constipation, diarrhea, hematemesis, hematochezia Genitourinary/Sexual: negative for, incontinence Ob/Gyn: negative for irregular bleeding Musculoskeletal: positive for back pain and joint pain Hematology: negative for, easy bruising, bleeding Neurologic/Psych: negative for, headaches, seizures, paralysis, weakness  Objective:  Physical Examination:  BP 133/70   Pulse 74   Temp 98.1 F (36.7 C) (Tympanic)   Resp 18   Ht 5' 2.6" (1.59 m)   Wt 197 lb 5 oz (89.5 kg)   BMI 35.40 kg/m    ECOG Performance  Status: 1 - Symptomatic but completely ambulatory  General appearance: alert, cooperative and appears stated age HEENT:PERRLA, extra ocular movement intact and sclera clear, anicteric Lymph node survey: non-palpable, inguinal Abdomen: soft, non-tender, without masses or organomegaly, no hernias and well healed incision Extremities: extremities normal, atraumatic, no cyanosis or edema, well healed incision Neurological exam reveals alert, oriented, normal speech, no focal findings or movement disorder noted.  Pelvic: exam chaperoned by nurse;  Vulva: normal appearing vulva with no masses, tenderness or lesions, well healed 3 cm oblique incision of the left vulva; Vagina: normal vagina; Adnexa: no masses; Uterus: surgically absent, vaginal cuff well healed; Cervix: surgically absent; Rectal: not indicated  Vulvar colposcopy and Biopsy The risks and benefits of the procedure were reviewed and informed consent obtained. Time out was performed. The patient received pre-procedure teaching and expressed understanding. The post-procedure instructions were reviewed with the patient and she expressed understanding. The patient does not have any barriers to learning.  The procedure was performed after application of acetic acid. No AWE lesions noted. The patient complained of persistent irritation/itching along the scar line. Vulvar biopsy was performed at this site - approximately 5 o'clock on the vulva the lateral aspect of the scar. The area was treated with topical lidocaine gel for initial topical anesthesia. Injection with 2% lidocaine 5 cc. Biopsy with punch 4 mm biopsy. Hemostasis with AgNO3 and Monsel's.   Post-procedure evaluation the patient was stable without complaints.   Lab Review Labs on site today: n/a  Radiologic Imaging: n/a    Assessment:  CORALEI TYRELL is a 71 y.o. female diagnosed with vulvar dysplasia (VIN3) s/p excisional biopsy with close, but negative, margins. Exam  negative but given complaints of persistent irritation/itching and close margins vulvar biopsy was performed.   Medical co-morbidities complicating care: HTN, [name], GERD, h/o melanoma.  Plan:   Problem List Items Addressed This Visit      Genitourinary   Vulvar dysplasia - Primary   Relevant  Orders   Surgical pathology      We will follow up biopsy. If negative continue to follow closely with repeat exam and return to clinic in  6 months. If positive we will determine treatment plan.   The patient's diagnosis, an outline of the further diagnostic and laboratory studies which will be required, the recommendation, and alternatives were discussed.  All questions were answered to the patient's satisfaction.  A total of 60 minutes were spent with the patient/family today; 50% was spent in education, counseling and coordination of care for VIN3.    Gillis Ends, MD    CC:  Referring Providers: Barnett Applebaum, MD Hervey Ard, MD   PCP: Idelle Crouch, MD [place] [facility] [place], [place] [address] [phone]

## 2017-01-28 NOTE — Patient Instructions (Signed)

## 2017-01-28 NOTE — Progress Notes (Signed)
  Oncology Nurse Navigator Documentation      )                                                           Oncology Nurse Navigator Documentation Chaperoned pelvic exam. Vulva biopsy sent to pathology. Navigator Location: [facility] (01/28/17 1500)   )Navigator Encounter Type: Initial GynOnc (01/28/17 1500)                                                    Time Spent with Patient: 30 (01/28/17 1500)

## 2017-01-29 ENCOUNTER — Encounter
Admission: RE | Disposition: A | Discharge status: Home / Self Care | Payer: Self-pay | Source: Ambulatory Visit | Attending: Gastroenterology

## 2017-01-29 ENCOUNTER — Ambulatory Visit: Payer: Medicare Other | Admitting: Anesthesiology

## 2017-01-29 ENCOUNTER — Encounter: Payer: Self-pay | Admitting: *Deleted

## 2017-01-29 ENCOUNTER — Ambulatory Visit
Admission: RE | Admit: 2017-01-29 | Discharge: 2017-01-29 | Disposition: A | Discharge status: Home / Self Care | Payer: Medicare Other | Source: Ambulatory Visit | Attending: Gastroenterology | Admitting: Gastroenterology

## 2017-01-29 DIAGNOSIS — K219 Gastro-esophageal reflux disease without esophagitis: Secondary | ICD-10-CM | POA: Diagnosis not present

## 2017-01-29 DIAGNOSIS — I2510 Atherosclerotic heart disease of native coronary artery without angina pectoris: Secondary | ICD-10-CM | POA: Diagnosis not present

## 2017-01-29 DIAGNOSIS — E785 Hyperlipidemia, unspecified: Secondary | ICD-10-CM | POA: Diagnosis not present

## 2017-01-29 DIAGNOSIS — Z7952 Long term (current) use of systemic steroids: Secondary | ICD-10-CM | POA: Insufficient documentation

## 2017-01-29 DIAGNOSIS — Z7982 Long term (current) use of aspirin: Secondary | ICD-10-CM | POA: Diagnosis not present

## 2017-01-29 DIAGNOSIS — B3781 Candidal esophagitis: Secondary | ICD-10-CM | POA: Diagnosis not present

## 2017-01-29 DIAGNOSIS — M069 Rheumatoid arthritis, unspecified: Secondary | ICD-10-CM | POA: Insufficient documentation

## 2017-01-29 DIAGNOSIS — Z79899 Other long term (current) drug therapy: Secondary | ICD-10-CM | POA: Insufficient documentation

## 2017-01-29 DIAGNOSIS — I10 Essential (primary) hypertension: Secondary | ICD-10-CM | POA: Diagnosis not present

## 2017-01-29 DIAGNOSIS — I340 Nonrheumatic mitral (valve) insufficiency: Secondary | ICD-10-CM | POA: Insufficient documentation

## 2017-01-29 DIAGNOSIS — K449 Diaphragmatic hernia without obstruction or gangrene: Secondary | ICD-10-CM | POA: Diagnosis not present

## 2017-01-29 DIAGNOSIS — K317 Polyp of stomach and duodenum: Secondary | ICD-10-CM | POA: Diagnosis present

## 2017-01-29 HISTORY — PX: UPPER ESOPHAGEAL ENDOSCOPIC ULTRASOUND (EUS): SHX6562

## 2017-01-29 SURGERY — UPPER ESOPHAGEAL ENDOSCOPIC ULTRASOUND (EUS)
Anesthesia: General

## 2017-01-29 MED ORDER — FENTANYL CITRATE (PF) 100 MCG/2ML IJ SOLN: 25.0000 ug | INTRAMUSCULAR | Status: DC | PRN

## 2017-01-29 MED ORDER — GLYCOPYRROLATE 0.2 MG/ML IJ SOLN
INTRAMUSCULAR | Status: DC | PRN
  Administered 2017-01-29: 0.2 mg via INTRAVENOUS

## 2017-01-29 MED ORDER — FENTANYL CITRATE (PF) 100 MCG/2ML IJ SOLN
INTRAMUSCULAR | Status: DC | PRN
  Administered 2017-01-29: 50 ug via INTRAVENOUS

## 2017-01-29 MED ORDER — METHYLENE BLUE 0.5 % INJ SOLN
INTRAVENOUS | Status: AC
  Filled 2017-01-29: qty 10

## 2017-01-29 MED ORDER — MIDAZOLAM HCL 2 MG/2ML IJ SOLN
INTRAMUSCULAR | Status: DC | PRN
Start: 2017-01-29 — End: 2017-01-29
  Administered 2017-01-29: 1 mg via INTRAVENOUS

## 2017-01-29 MED ORDER — LIDOCAINE HCL (CARDIAC) 20 MG/ML IV SOLN
INTRAVENOUS | Status: DC | PRN
  Administered 2017-01-29: 80 mg via INTRAVENOUS

## 2017-01-29 MED ORDER — FENTANYL CITRATE (PF) 100 MCG/2ML IJ SOLN
INTRAMUSCULAR | Status: AC
  Filled 2017-01-29: qty 2

## 2017-01-29 MED ORDER — ONDANSETRON HCL 4 MG/2ML IJ SOLN: 4.0000 mg | Freq: Once | INTRAMUSCULAR | Status: DC | PRN

## 2017-01-29 MED ORDER — SODIUM CHLORIDE 0.9 % IV SOLN
INTRAVENOUS | Status: DC
  Administered 2017-01-29: 13:00:00 via INTRAVENOUS

## 2017-01-29 MED ORDER — PROPOFOL 500 MG/50ML IV EMUL
INTRAVENOUS | Status: DC | PRN
  Administered 2017-01-29: 150 ug/kg/min via INTRAVENOUS

## 2017-01-29 MED ORDER — MIDAZOLAM HCL 2 MG/2ML IJ SOLN
INTRAMUSCULAR | Status: AC
  Filled 2017-01-29: qty 2

## 2017-01-29 MED ORDER — METHYLENE BLUE 0.5 % INJ SOLN
INTRAVENOUS | Status: DC | PRN
  Administered 2017-01-29: 11 mL via SUBMUCOSAL

## 2017-01-29 MED ORDER — PROPOFOL 10 MG/ML IV BOLUS
INTRAVENOUS | Status: DC | PRN
  Administered 2017-01-29: 50 mg via INTRAVENOUS

## 2017-01-29 NOTE — Transfer of Care (Signed)
Immediate Anesthesia Transfer of Care Note  Patient: Erin [name]  Procedure(s) Performed: Procedure(s): UPPER ESOPHAGEAL ENDOSCOPIC ULTRASOUND (EUS) (N/A)  Patient Location: PACU  Anesthesia Type:General  Level of Consciousness: sedated and responds to stimulation  Airway & Oxygen Therapy: Patient Spontanous Breathing and Patient connected to nasal cannula oxygen  Post-op Assessment: Report given to RN and Post -op Vital signs reviewed and stable  Post vital signs: Reviewed and stable  Last Vitals:  Vitals:   01/29/17 1308 01/29/17 1405  BP: (!) 146/71 (!) 129/59  Pulse:  88  Resp: 14 19  Temp: 36.3 C     Last Pain: There were no vitals filed for this visit.       Complications: No apparent anesthesia complications

## 2017-01-29 NOTE — Discharge Instructions (Signed)
Discharge to home °

## 2017-01-29 NOTE — Op Note (Signed)
[facility] Gastroenterology Patient Name: Erin [name] Procedure Date: 01/29/2017 1:14 PM MRN: [id number] Account #: [id number] Date of Birth: 09-27-1946 Admit Type: Outpatient Age: 71 Room: [facility] ENDO ROOM 3 Gender: Female Note Status: Finalized Procedure:            Upper GI endoscopy Indications:          Established gastric cardia polyp, For attempted                        resection of gastric cardia polyp Patient Profile:      Refer to note in patient chart for documentation of                        history and physical. Providers:            Murray Hodgkins. [place] Referring MD:         [name]. Doy Hutching, MD (Referring MD), Lollie Sails, MD (Referring MD) Medicines:            Propofol per Anesthesia Complications:        No immediate complications. Procedure:            Pre-Anesthesia Assessment:                       Prior to the procedure, a History and Physical was                        performed, and patient medications and allergies were                        reviewed. The patient is competent. The risks and                        benefits of the procedure and the sedation options and                        risks were discussed with the patient. All questions                        were answered and informed consent was obtained.                        Patient identification and proposed procedure were                        verified by the physician, the nurse and the                        anesthetist in the pre-procedure area. Mental Status                        Examination: alert and oriented. Airway Examination:                        normal oropharyngeal airway and neck mobility.                        Respiratory Examination: clear to auscultation.  CV                        Examination: normal. Prophylactic Antibiotics: The                        patient does not require prophylactic antibiotics.                         Prior Anticoagulants: The patient has taken no previous                        anticoagulant or antiplatelet agents. ASA Grade                        Assessment: III - A patient with severe systemic                        disease. After reviewing the risks and benefits, the                        patient was deemed in satisfactory condition to undergo                        the procedure. The anesthesia plan was to use monitored                        anesthesia care (MAC). Immediately prior to                        administration of medications, the patient was                        re-assessed for adequacy to receive sedatives. The                        heart rate, respiratory rate, oxygen saturations, blood                        pressure, adequacy of pulmonary ventilation, and                        response to care were monitored throughout the                        procedure. The physical status of the patient was                        re-assessed after the procedure.                       After obtaining informed consent, the endoscope was                        passed under direct vision. Throughout the procedure,                        the patient's blood pressure, pulse, and oxygen                        saturations were monitored continuously.After obtaining  informed consent, the endoscope was passed under direct                        vision. Throughout the procedure, the patient's blood                        pressure, pulse, and oxygen saturations were monitored                        continuously. The Endoscope was introduced through the                        mouth, and advanced to the second part of duodenum. The                        upper GI endoscopy was accomplished without difficulty.                        The patient tolerated the procedure well. Findings:      Patchy candidiasis was found in the upper third of the esophagus and in        the middle third of the esophagus.      A single 20 mm semi-sessile polyp was found in the cardia of the       stomach. Preparations were made for mucosal resection. A 1:10,000       solution of epinephrine with methylene blue and saline was injected to       raise the lesion. Snare mucosal resection was performed. Resection and       retrieval were complete. To prevent bleeding after mucosal resection,       one hemostatic clip was successfully placed (MR conditional). There was       no bleeding during or at the end of the procedure.      A moderate amount of retained food was present in the gastric fundus.       The exam of the stomach was otherwise normal.      The examined duodenum was normal. Impression:           - Candida esophagitis. Appears improved compared to                        prior endoscopy.                       - Moderate amount of retained food in the stomach. EUS                        was not performed given risk of aspiration.                       - A single inflammatory appearing gastric cardia polyp.                        EMR performed with clip closure of the resection site.                       - Normal examined duodenum. Recommendation:       - Discharge patient to home (ambulatory).                       -  Await pathology results.                       - The findings and recommendations were discussed with                        the patient and her family.                       - Return to referring physician as previously                        scheduled. No further endoscopic follow-up is necessary                        if pathology confirms a benign inflammatory polyp. Procedure Code(s):    --- Professional ---                       [phone], Esophagogastroduodenoscopy, flexible, transoral;                        with endoscopic mucosal resection Diagnosis Code(s):    --- Professional ---                       K31.7, Polyp of stomach and  duodenum                       B37.81, Candidal esophagitis CPT copyright 2016 American Medical Association. All rights reserved. The codes documented in this report are preliminary and upon coder review may  be revised to meet current compliance requirements. Attending Participation:      I personally performed the entire procedure without the assistance of a       fellow, resident or surgical assistant. [place],  01/29/2017 2:11:23 PM This report has been signed electronically. Number of Addenda: 0 Note Initiated On: 01/29/2017 1:14 PM Estimated Blood Loss: Estimated blood loss: none.      [facility]

## 2017-01-29 NOTE — H&P (View-Only) (Signed)
Outpatient short stay form Pre-procedure 01/20/2017 9:24 AM Erin Sails MD  Primary Physician: Dr. Fulton [name]  Reason for visit:  EGD and colonoscopy  History of present illness:  Patient is a 71 year old female presenting today as above. Patient tolerated her prep well. She does take 81 mg aspirin but has held that for several days. She takes no other aspirin products or blood thinning agents.  Patient also has a history of marked rheumatoid arthritis as well as scleroderma. She shows pronounced changes in her hands with this. 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 She had a barium swallow done on 11/05/2016 showing a dilated thoracic esophagus with decreased peristalsis and debris noted in the esophagus. There is no obstructing lesion noted. He does have a small sliding hiatal hernia. There was some mild reflux. Patient does report having a lot of reflux issues. She has been taking her proton pump inhibitor before bedtime which is likely affecting the effectivity of it. We discussed several issues in this regard.    Current Facility-Administered Medications:  .  0.9 %  sodium chloride infusion, , Intravenous, Continuous, Erin Sails, MD .  0.9 %  sodium chloride infusion, , Intravenous, Continuous, Erin Sails, MD  Prescriptions Prior to Admission  Medication Sig Dispense Refill Last Dose  . ALPRAZolam (XANAX) 0.5 MG tablet Take 0.5mg s twice daily as needed for anxiety   Past Week at Unknown time  . amLODipine (NORVASC) 5 MG tablet 5 mg bid   01/19/2017 at Unknown time  . aspirin EC 81 MG tablet Take 81 mg by mouth at bedtime.    01/15/2017 at Unknown time  . Clobetasol Prop Emollient Base (CLOBETASOL PROPIONATE E) 0.05 % emollient cream Apply 1 application topically 2 (two) times daily as needed (sores).    01/20/2017 at Unknown time  . latanoprost (XALATAN) 0.005 % ophthalmic solution Place 1 drop into both eyes at bedtime.    01/18/2017 at Unknown time  . predniSONE  (DELTASONE) 5 MG tablet Take 5 mg by mouth daily with breakfast.   01/19/2017  . simvastatin (ZOCOR) 80 MG tablet Take 80mg s at bedtime   01/19/2017 at Unknown time  . zolpidem (AMBIEN) 10 MG tablet Take 10 mg by mouth at bedtime as needed for sleep.    01/19/2017 at Unknown time  . cetirizine (ZYRTEC) 10 MG tablet Take 10 mg by mouth daily as needed for allergies.   01/19/2017  . dexlansoprazole (DEXILANT) 60 MG capsule Take 60 mg by mouth at bedtime.    01/18/2017  . ezetimibe (ZETIA) 10 MG tablet Take 1 tablet (10 mg total) by mouth daily. 30 tablet 11 01/18/2017  . FLUoxetine (PROZAC) 20 MG capsule 60 mg by mouth every morning   01/19/2017  . folic acid (FOLVITE) A999333 MCG tablet Take 400 mcg by mouth daily.    01/19/2017  . furosemide (LASIX) 20 MG tablet Take 20mg s daily as needed for swelling   Taking  . methotrexate (RHEUMATREX) 2.5 MG tablet Take 25mg s once a week on Fridays   01/16/2017  . Multiple Vitamin (MULTI-VITAMINS) TABS Take 1 tablet by mouth daily.    01/15/2017  . Omega-3 Fatty Acids (FISH OIL PO) Take 1 capsule by mouth daily.    01/15/2017     Allergies  Allergen Reactions  . Remicade [Infliximab] Shortness Of Breath and Itching  . Actonel [Risedronate Sodium] Other (See Comments)  . Fosamax [Alendronate Sodium]     GI Bleed   . Procardia [Nifedipine] Hives  . Sulfur  Swelling  . Wellbutrin [Bupropion] Anxiety     Past Medical History:  Diagnosis Date  . Anemia    h/o  . Cancer ([place])    melanoma  . Coronary artery disease   . DDD (degenerative disc disease), lumbar   . Dysphagia   . Dyspnea    with exertion-unable to walk a mile without getting sob- dr sparks set pt up to see Dr Rockey Situ after 11-19-16 surgery  . Esophageal dysmotility   . Fibrocystic breast disease   . GERD (gastroesophageal reflux disease)   . Heart murmur    h/o   . Hyperlipidemia   . Hypertension   . Mitral regurgitation   . Osteopenia   . Pulmonary fibrosis ([place])    per Dr Raul Del  . RA  (rheumatoid arthritis) ([place])   . Raynaud's disease   . Scleroderma ([place])     Review of systems:      Physical Exam    Heart and lungs: Regular rate and rhythm without rub or gallop, lungs are bilaterally clear.    HEENT: Normocephalic atraumatic eyes are anicteric    Other:     Pertinant exam for procedure: Soft nontender nondistended bowel sounds positive normoactive.    Planned proceedures: EGD, colonoscopy, and indicated procedures. I have discussed the risks benefits and complications of procedures to include not limited to bleeding, infection, perforation and the risk of sedation and the patient wishes to proceed.    Erin Sails, MD Gastroenterology 01/20/2017  9:24 AM

## 2017-01-29 NOTE — Interval H&P Note (Signed)
History and Physical Interval Note:  01/29/2017 1:18 PM  Erin [name]  has presented today for surgery, with the diagnosis of ESOPHAGEAL LESION ESOPHAGEAL POLYP  The various methods of treatment have been discussed with the patient and family. After consideration of risks, benefits and other options for treatment, the patient has consented to  Procedure(s): UPPER ESOPHAGEAL ENDOSCOPIC ULTRASOUND (EUS) (N/A) as a surgical intervention .  The patient's history has been reviewed, patient examined, no change in status, stable for surgery.  I have reviewed the patient's chart and labs.  Questions were answered to the patient's satisfaction.     Tillie Rung

## 2017-01-29 NOTE — Anesthesia Post-op Follow-up Note (Cosign Needed)
Anesthesia QCDR form completed.        

## 2017-01-29 NOTE — Anesthesia Preprocedure Evaluation (Signed)
Anesthesia Evaluation  Patient identified by MRN, date of birth, ID band Patient awake    Reviewed: Allergy & Precautions, NPO status , Patient's Chart, lab work & pertinent test results  History of Anesthesia Complications Negative for: history of anesthetic complications  Airway Mallampati: II  TM Distance: >3 FB Neck ROM: Full    Dental  (+) Upper Dentures, Lower Dentures   Pulmonary shortness of breath, neg sleep apnea, neg COPD,    breath sounds clear to auscultation- rhonchi (-) wheezing      Cardiovascular hypertension, + [name] (nonocclusive)  (-) Past MI, (-) Cardiac Stents and (-) CABG  Rhythm:Regular Rate:Normal - Systolic murmurs and - Diastolic murmurs Echo [id number]: NORMAL LEFT VENTRICULAR SYSTOLIC FUNCTION NORMAL RIGHT VENTRICULAR SYSTOLIC FUNCTION MILD VALVULAR REGURGITATION (mild TR, trivial MR) NO VALVULAR STENOSIS   Neuro/Psych negative neurological ROS  negative psych ROS   GI/Hepatic Neg liver ROS, GERD  ,  Endo/Other  negative endocrine ROSneg diabetes  Renal/GU negative Renal ROS     Musculoskeletal  (+) Arthritis ,   Abdominal (+) + obese,   Peds  Hematology  (+) anemia ,   Anesthesia Other Findings Past Medical History: No date: Anemia     Comment: h/o No date: Cancer ([place])     Comment: melanoma No date: Coronary artery disease No date: DDD (degenerative disc disease), lumbar No date: Dysphagia No date: Dyspnea     Comment: with exertion-unable to walk a mile without               getting sob- dr sparks set pt up to see Dr               Rockey Situ after 11-19-16 surgery No date: Esophageal dysmotility No date: Fibrocystic breast disease No date: GERD (gastroesophageal reflux disease) No date: Heart murmur     Comment: h/o  No date: Hyperlipidemia No date: Hypertension No date: Mitral regurgitation No date: Osteopenia No date: Pulmonary fibrosis (HCC)     Comment: per Dr  Raul Del No date: RA (rheumatoid arthritis) ([place]) No date: Raynaud's disease No date: Scleroderma ([place])   Reproductive/Obstetrics                             Anesthesia Physical Anesthesia Plan  ASA: III  Anesthesia Plan: General   Post-op Pain Management:    Induction: Intravenous  Airway Management Planned: Natural Airway  Additional Equipment:   Intra-op Plan:   Post-operative Plan:   Informed Consent: I have reviewed the patients History and Physical, chart, labs and discussed the procedure including the risks, benefits and alternatives for the proposed anesthesia with the patient or authorized representative who has indicated his/her understanding and acceptance.   Dental advisory given  Plan Discussed with: CRNA and Anesthesiologist  Anesthesia Plan Comments:         Anesthesia Quick Evaluation

## 2017-01-29 NOTE — Anesthesia Procedure Notes (Signed)
Performed by: Marrissa Dai Pre-anesthesia Checklist: Patient identified, Emergency Drugs available, Suction available, Patient being monitored and Timeout performed Patient Re-evaluated:Patient Re-evaluated prior to inductionOxygen Delivery Method: Nasal cannula Preoxygenation: Pre-oxygenation with 100% oxygen Intubation Type: IV induction       

## 2017-01-30 ENCOUNTER — Encounter: Payer: Self-pay | Admitting: Internal Medicine

## 2017-01-30 ENCOUNTER — Telehealth: Payer: Self-pay

## 2017-01-30 LAB — SURGICAL PATHOLOGY

## 2017-01-30 NOTE — Progress Notes (Signed)
  Oncology Nurse Navigator Documentation EUS report routed to Dr. Gustavo Lah. Navigator Location: [facility] (01/30/17 1000)   )Navigator Encounter Type: Letter/Fax/Email;Diagnostic Results (01/30/17 1000)                                 Coordination of Care: EUS (01/30/17 1000)                  Time Spent with Patient: 15 (01/30/17 1000)

## 2017-01-30 NOTE — Anesthesia Postprocedure Evaluation (Signed)
Anesthesia Post Note  Patient: Erin [name]  Procedure(s) Performed: Procedure(s) (LRB): UPPER ESOPHAGEAL ENDOSCOPIC ULTRASOUND (EUS) (N/A)  Patient location during evaluation: Endoscopy Anesthesia Type: General Level of consciousness: awake and alert Pain management: pain level controlled Vital Signs Assessment: post-procedure vital signs reviewed and stable Respiratory status: spontaneous breathing, nonlabored ventilation, respiratory function stable and patient connected to nasal cannula oxygen Cardiovascular status: blood pressure returned to baseline and stable Postop Assessment: no signs of nausea or vomiting Anesthetic complications: no     Last Vitals:  Vitals:   01/29/17 1410 01/29/17 1420  BP: 123/66 (!) 113/59  Pulse: 92 86  Resp: (!) 22 20  Temp:      Last Pain:  Vitals:   01/30/17 0731  PainSc: 0-No pain                 Martha Clan

## 2017-01-30 NOTE — Telephone Encounter (Signed)
  Oncology Nurse Navigator Documentation Notified of negative biopsy from vulva. She understands to follow up in 6 months. DIAGNOSIS:  A. VULVA, LEFT, 5:00:  - VULVAR TISSUE WITH SCANT INFLAMMATION.  - NEGATIVE FOR DYSPLASIA AND MALIGNANCY.  - MULTIPLE DEEPER LEVELS WERE EXAMINED.     Navigator Location: [facility] (01/30/17 1700)   )Navigator Encounter Type: Telephone;Diagnostic Results (01/30/17 1700)                                                    Time Spent with Patient: 15 (01/30/17 1700)

## 2017-02-02 LAB — SURGICAL PATHOLOGY

## 2017-02-02 NOTE — Progress Notes (Signed)
  Oncology Nurse Navigator Documentation Pathology from EUS routed to Dr. Gustavo Lah Navigator Location: [facility] (02/02/17 1500)   )Navigator Encounter Type: Letter/Fax/Email;Diagnostic Results (02/02/17 1500)                                                    Time Spent with Patient: 15 (02/02/17 1500)

## 2017-02-05 ENCOUNTER — Telehealth: Payer: Self-pay

## 2017-02-05 NOTE — Telephone Encounter (Signed)
  Oncology Nurse Navigator Documentation Received call from Ms. Creasey asking for pathology report from EUS she previousy had. Instructed that it would need to be obtained from Dr. Marton Redwood office. She verbalized understanding. Navigator Location: [facility] (02/05/17 1500)   )Navigator Encounter Type: Telephone (02/05/17 1500) Telephone: Diagnostic Results (02/05/17 1500)                                                  Time Spent with Patient: 15 (02/05/17 1500)

## 2017-02-06 ENCOUNTER — Telehealth: Payer: Self-pay

## 2017-02-06 NOTE — Telephone Encounter (Signed)
  Oncology Nurse Navigator Documentation Received call from patient asking what she can use for vulvar itching. Recent biopsy was negative. Per Dr. 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 Notified Erin [name] regarding. She then asked if she can just continue to use the Clobetasol cream that her dermatologist had prescribed for her and instructed that she may have to use long term. I offered to ask Dr. Theora Gianotti regarding. She did not want that and also stated that she did not want to travel to [facility] to see another dermatologist that she felt it wasn't that worrisome to her. Says she just needs to apply Vaseline or something over the counter to ease it. She stated she can see her local dermatologist if it becomes to where she cannot tolerate it. She just wants to make sure she doesn't need to worry regarding cancer. Reiterated that her biopsy was negative and she needs to continue with every 6 month follow ups. She verbalized understanding and asked to just hold onto Xylocaine script for now. She knows she can call with any further issues or concerns.   Navigator Location: [facility] (02/06/17 1100)   )Navigator Encounter Type: Telephone (02/06/17 1100) Telephone: Symptom Mgt (02/06/17 1100)                                                  Time Spent with Patient: 30 (02/06/17 1100)

## 2017-02-09 ENCOUNTER — Telehealth: Payer: Self-pay

## 2017-02-09 MED ORDER — LIDOCAINE HCL 2 % EX GEL: 1.0000 "application " | CUTANEOUS | 0 refills | Status: DC | PRN

## 2017-02-09 NOTE — Telephone Encounter (Signed)
  Oncology Nurse Navigator Documentation Received call from patient's pharmacy with notification the xylocaine gel is on national back order at this time. I notified Erin [name] and she call get in touch with her dermatologist regarding her issue since she does not want referral to [facility]. Navigator Location: [facility] (02/09/17 1500)   )Navigator Encounter Type: Telephone (02/09/17 1500)                                                    Time Spent with Patient: 15 (02/09/17 1500)

## 2017-02-09 NOTE — Telephone Encounter (Signed)
  Oncology Nurse Navigator Documentation Received call back from Ms. Hertenstein asking the held script to be sent in. She will follow up with her local dermatologist for further needs regarding vulvar itching. Navigator Location: [facility] (02/09/17 1400)   )Navigator Encounter Type: Telephone (02/09/17 1400) Telephone: Symptom Mgt (02/09/17 1400)                                                  Time Spent with Patient: 15 (02/09/17 1400)

## 2017-02-23 ENCOUNTER — Encounter: Payer: Self-pay | Admitting: Pulmonary Disease

## 2017-02-23 ENCOUNTER — Ambulatory Visit (INDEPENDENT_AMBULATORY_CARE_PROVIDER_SITE_OTHER): Payer: Medicare Other | Admitting: Pulmonary Disease

## 2017-02-23 VITALS — BP 142/86 | HR 95 | Ht 61.0 in | Wt 199.0 lb

## 2017-02-23 DIAGNOSIS — M349 Systemic sclerosis, unspecified: Secondary | ICD-10-CM | POA: Diagnosis not present

## 2017-02-23 DIAGNOSIS — E668 Other obesity: Secondary | ICD-10-CM | POA: Diagnosis not present

## 2017-02-23 DIAGNOSIS — I25119 Atherosclerotic heart disease of native coronary artery with unspecified angina pectoris: Secondary | ICD-10-CM | POA: Diagnosis not present

## 2017-02-23 DIAGNOSIS — J8410 Pulmonary fibrosis, unspecified: Secondary | ICD-10-CM

## 2017-02-23 DIAGNOSIS — R0609 Other forms of dyspnea: Secondary | ICD-10-CM | POA: Diagnosis not present

## 2017-02-23 NOTE — Progress Notes (Signed)
PULMONARY CONSULT NOTE  Requesting MD/Service: Gollan. Primary MD: Doy Hutching. RheumJefm Bryant Date of initial consultation: 02/23/17 Reason for consultation: Scleroderma, pulmonary fibrosis, progressive DOE  PT PROFILE: 71 y.o. 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 Previously followed by Dr Raul Del  DATA: PFTs 2015: No obstruction, TLC 90%, DLCO 72% CT chest 10/01/15: Dilated esophagus consistent with the given history of scleroderma. This is also stable from the most recent prior CT. Lungs show changes of interstitial fibrosis, likely usual interstitial fibrosis, predominantly in the lower lungs, similar to the most recent prior CT CXR 11/18/16: Chronic interstitial changes are present at the lung bases  HPI:  As above. She notes progressive DOE over many years and now has class III/IV dyspnea, struggling with basic ADLs. Denies CP, fever, purulent sputum, hemoptysis, LE edema and calf tenderness. She also has esophageal dysmotility which affects significantly her food choices and gives her chronic intermittent HB symptoms. She is treated with chronic PPI therapy.   Past Medical History:  Diagnosis Date  . Anemia    h/o  . Cancer ([place])    melanoma  . Coronary artery disease   . DDD (degenerative disc disease), lumbar   . Dysphagia   . Dyspnea    with exertion-unable to walk a mile without getting sob- dr sparks set pt up to see Dr Rockey Situ after 11-19-16 surgery  . Esophageal dysmotility   . Fibrocystic breast disease   . GERD (gastroesophageal reflux disease)   . Heart murmur    h/o   . Hyperlipidemia   . Hypertension   . Mitral regurgitation   . Osteopenia   . Pulmonary fibrosis ([place])    per Dr Raul Del  . RA (rheumatoid arthritis) ([place])   . Raynaud's disease   . Scleroderma [facility])     Past Surgical History:  Procedure Laterality Date  . ABDOMINAL HYSTERECTOMY    . BREAST CYST  EXCISION Left 20+ years ago   No scar visible  . CARPAL TUNNEL RELEASE Bilateral   . COLONOSCOPY    . COLONOSCOPY WITH PROPOFOL N/A 01/20/2017   Procedure: COLONOSCOPY WITH PROPOFOL;  Surgeon: Lollie Sails, MD;  Location: [facility] ENDOSCOPY;  Service: Endoscopy;  Laterality: N/A;  . ESOPHAGOGASTRODUODENOSCOPY    . ESOPHAGOGASTRODUODENOSCOPY (EGD) WITH PROPOFOL N/A 01/20/2017   Procedure: ESOPHAGOGASTRODUODENOSCOPY (EGD) WITH PROPOFOL;  Surgeon: Lollie Sails, MD;  Location: [facility] ENDOSCOPY;  Service: Endoscopy;  Laterality: N/A;  . EXCISION HYDRADENITIS LABIA Left 11/19/2016   Procedure: EXCISION LABIAL MASS;  Surgeon: Robert Bellow, MD;  Location: [facility] ORS;  Service: General;  Laterality: Left;  . EYE SURGERY Bilateral    cataracts  . HEEL SPUR EXCISION    . MASS EXCISION Left 11/19/2016   Procedure: EXCISION LEFT THIGH MELANOMA;  Surgeon: Robert Bellow, MD;  Location: [facility] ORS;  Service: General;  Laterality: Left;  . SENTINEL NODE BIOPSY Left 11/19/2016   Procedure: INGUINAL SENTINEL NODE BIOPSY;  Surgeon: Robert Bellow, MD;  Location: [facility] ORS;  Service: General;  Laterality: Left;  . UPPER ESOPHAGEAL ENDOSCOPIC ULTRASOUND (EUS) N/A 01/29/2017   Procedure: UPPER ESOPHAGEAL ENDOSCOPIC ULTRASOUND (EUS);  Surgeon: Holly Bodily, MD;  Location: [facility] ENDOSCOPY;  Service: Endoscopy;  Laterality: N/A;    MEDICATIONS: I have reviewed all medications and confirmed regimen as documented  Social History   Social History  . Marital status: Widowed    Spouse name: N/A  . Number of  children: N/A  . Years of education: N/A   Occupational History  . retired    Social History Main Topics  . Smoking status: Never Smoker  . Smokeless tobacco: Never Used  . Alcohol use No  . Drug use: No  . Sexual activity: Not on file   Other Topics Concern  . Not on file   Social History Narrative  . No narrative on file    Family History  Problem Relation Age of Onset  .  Endometrial cancer Mother 42  . Bladder Cancer Father   . Cerebral palsy Sister     ROS: No fever, myalgias/arthralgias, unexplained weight loss or weight gain No new focal weakness or sensory deficits No otalgia, hearing loss, visual changes, nasal and sinus symptoms, mouth and throat problems No neck pain or adenopathy No abdominal pain, N/V/D, diarrhea, change in bowel pattern No dysuria, change in urinary pattern   Vitals:   02/23/17 1357  BP: (!) 142/86  Pulse: 95  SpO2: 98%  Weight: 199 lb (90.3 kg)  Height: 5\' 1"  (1.549 m)  RA  EXAM:  Gen: WDWN, No overt respiratory distress HEENT: NCAT, multiple telangiectasias on face, sclera white, oropharynx normal Neck: Supple without LAN, thyromegaly, JVD Lungs: breath sounds mildly diminished, percussion normal, minimal bibasilar crackles, no wheezes Cardiovascular: RRR, no murmurs noted Abdomen: Soft, nontender, normal BS Ext: SSc skin changes, severe arthritic destructive changes with involution of multiple distal phalanges Neuro: CNs grossly intact, motor and sensory intact Skin: innumerable telangiectasias   DATA:   BMP Latest Ref Rng & Units 04/06/2012  Creatinine 0.60 - 1.30 mg/dL 0.79    No flowsheet data found.  CXR:  Reviewed as noted above  IMPRESSION:     ICD-9-CM ICD-10-CM   1. Pulmonary fibrosis ([place]) 515 J84.10 Pulmonary Function Test [facility] Only     DG Chest 2 View  2. Scleroderma ([place]) 710.1 M34.9 ECHOCARDIOGRAM COMPLETE     Pulmonary Function Test [facility] Only     DG Chest 2 View  3. DOE (dyspnea on exertion) 786.09 R06.09 ECHOCARDIOGRAM COMPLETE     6 minute walk  4. Moderate obesity 278.00 E66.8    Severe SSc with multisystem involvement (Skin, joints, esophagus, Raynaud's, lungs) and progressive DOE. The previous PFTs were only minimally abnormal. The chest exam and recent CXR suggest mild pulmonary fibrosis. Raynaud's phenomenon raises concern for possible PAH but there are no exam findings to  support this concern. I see no prior echocardiogram results. A recent cardiac eval revealed normal LVEF by NM stress test with no evidence of ischemia. She acknowledges that weight gain might be contributing to DOE. None of these factors explain the profundity of her dyspnea.   PLAN:  Tests ordered to evaluate pulmonary fibrosis and shortness of breath:  Echocardiogram Pulmonary function tests 6 minute walk test  Follow-up in 6-8 weeks with chest x-ray prior to that visit. We will consider CT chest @ that time depending on the results of the above  Merton Border, MD PCCM service Mobile [phone] Pager [phone] 02/23/2017

## 2017-02-23 NOTE — Patient Instructions (Addendum)
Tests ordered to evaluate your pulmonary fibrosis and shortness of breath:  Echocardiogram Pulmonary function tests 6 minute walk test  Follow-up in 6-8 weeks with chest x-ray prior to that visit

## 2017-02-24 ENCOUNTER — Telehealth: Payer: Self-pay | Admitting: Cardiovascular Disease

## 2017-02-24 NOTE — Telephone Encounter (Signed)
Pt calling stating since she's started taking Zetiya  She been having this nervous feeling  Would like to know if that can be caused by that Please advise

## 2017-02-24 NOTE — Telephone Encounter (Signed)
Spoke w/ pt.  She reports that she started Zetia on 12/15/16 and has had a nervous feeling since then.  She also had an EGD around that time was developed a fungal infection in her throat.   She has been on an antifungal for the past month and is unsure if this is the cause of her sx. She would like to know if nervousness is a possible side effect of Zetia. Advised her that I do not see this listed under Zetia info, but she asks that I see if Dr. Rockey Situ has any more info. Advised her that I will make him aware of her concerns and call him back w/ his recommendation.

## 2017-02-25 NOTE — Telephone Encounter (Signed)
I'm unaware of any anxiety feelings that could come from the Zetia Certainly she could stop the medication for a week or 2 and restart later

## 2017-02-26 NOTE — Telephone Encounter (Signed)
Spoke w/ pt. Advised her of Dr. Donivan Scull recommendation.  She states that she feels that she has a lot going on right now and that she might just be overwhelmed. She will consider holding the Zetia, but she does not feel that it is the cause. Asked her to call back if we can be of further assistance.

## 2017-03-04 ENCOUNTER — Ambulatory Visit (INDEPENDENT_AMBULATORY_CARE_PROVIDER_SITE_OTHER): Payer: Medicare Other | Admitting: *Deleted

## 2017-03-04 DIAGNOSIS — R0609 Other forms of dyspnea: Secondary | ICD-10-CM

## 2017-03-04 NOTE — Progress Notes (Signed)
SMW performed today. 

## 2017-03-18 ENCOUNTER — Ambulatory Visit (INDEPENDENT_AMBULATORY_CARE_PROVIDER_SITE_OTHER): Payer: Medicare Other | Admitting: General Surgery

## 2017-03-18 ENCOUNTER — Encounter: Payer: Self-pay | Admitting: General Surgery

## 2017-03-18 VITALS — BP 156/86 | HR 74 | Resp 18 | Ht 61.5 in | Wt 196.0 lb

## 2017-03-18 DIAGNOSIS — C439 Malignant melanoma of skin, unspecified: Secondary | ICD-10-CM

## 2017-03-18 NOTE — Progress Notes (Signed)
Patient ID: Erin [name], female   DOB: 02-10-1946, 71 y.o.   MRN: [id number]  Chief Complaint  Patient presents with  . Follow-up    HPI Erin [name] is a 71 y.o. female.  Here today for follow melanoma left leg. She states she is doing well. She did see Dr Theora Gianotti and underwent vulvar biopsy in February 2018 with no evidence for recurrent in situ carcinoma.Marland Kitchen  HPI  Past Medical History:  Diagnosis Date  . Anemia    h/o  . Cancer ([place])    melanoma  . Coronary artery disease   . DDD (degenerative disc disease), lumbar   . Dysphagia   . Dyspnea    with exertion-unable to walk a mile without getting sob- dr sparks set pt up to see Dr Rockey Situ after 11-19-16 surgery  . Esophageal dysmotility   . Fibrocystic breast disease   . GERD (gastroesophageal reflux disease)   . Heart murmur    h/o   . Hyperlipidemia   . Hypertension   . Mitral regurgitation   . Osteopenia   . Pulmonary fibrosis ([place])    per Dr Raul Del  . RA (rheumatoid arthritis) ([place])   . Raynaud's disease   . Scleroderma [facility])     Past Surgical History:  Procedure Laterality Date  . ABDOMINAL HYSTERECTOMY    . BREAST CYST EXCISION Left 20+ years ago   No scar visible  . CARPAL TUNNEL RELEASE Bilateral   . COLONOSCOPY    . COLONOSCOPY WITH PROPOFOL N/A 01/20/2017   Procedure: COLONOSCOPY WITH PROPOFOL;  Surgeon: Lollie Sails, MD;  Location[facility] ENDOSCOPY;  Service: Endoscopy;  Laterality: N/A;  . ESOPHAGOGASTRODUODENOSCOPY    . ESOPHAGOGASTRODUODENOSCOPY (EGD) WITH PROPOFOL N/A 01/20/2017   Procedure: ESOPHAGOGASTRODUODENOSCOPY (EGD) WITH PROPOFOL;  Surgeon: Lollie Sails, MD;  Location: [facility] ENDOSCOPY;  Service: Endoscopy;  Laterality: N/A;  . EXCISION HYDRADENITIS LABIA Left 11/19/2016   Procedure: EXCISION LABIAL MASS;  Surgeon: Robert Bellow, MD;  Location: [facility] ORS;  Service: General;  Laterality: Left;  . EYE SURGERY Bilateral    cataracts  . HEEL SPUR EXCISION    . MASS EXCISION Left  11/19/2016   Procedure: EXCISION LEFT THIGH MELANOMA;  Surgeon: Robert Bellow, MD;  Location: [facility] ORS;  Service: General;  Laterality: Left;  . SENTINEL NODE BIOPSY Left 11/19/2016   Procedure: INGUINAL SENTINEL NODE BIOPSY;  Surgeon: Robert Bellow, MD;  Location: [facility] ORS;  Service: General;  Laterality: Left;  . UPPER ESOPHAGEAL ENDOSCOPIC ULTRASOUND (EUS) N/A 01/29/2017   Procedure: UPPER ESOPHAGEAL ENDOSCOPIC ULTRASOUND (EUS);  Surgeon: Holly Bodily, MD;  Location: [facility] ENDOSCOPY;  Service: Endoscopy;  Laterality: N/A;    Family History  Problem Relation Age of Onset  . Endometrial cancer Mother 47  . Bladder Cancer Father   . Cerebral palsy Sister     Social History Social History  Substance Use Topics  . Smoking status: Never Smoker  . Smokeless tobacco: Never Used  . Alcohol use No    Allergies  Allergen Reactions  . Remicade [Infliximab] Shortness Of Breath and Itching  . Actonel [Risedronate Sodium] Other (See Comments)  . Fosamax [Alendronate Sodium]     GI Bleed   . Procardia [Nifedipine] Hives  . Sulfur Swelling  . Wellbutrin [Bupropion] Anxiety    Current Outpatient Prescriptions  Medication Sig Dispense Refill  . ALPRAZolam (XANAX) 0.5 MG tablet Take 0.5mg s twice daily as needed for anxiety    . amLODipine (NORVASC) 5 MG tablet  5 mg bid    . aspirin EC 81 MG tablet Take 81 mg by mouth at bedtime.     . cetirizine (ZYRTEC) 10 MG tablet Take 10 mg by mouth daily as needed for allergies.    . Clobetasol Prop Emollient Base (CLOBETASOL PROPIONATE E) 0.05 % emollient cream Apply 1 application topically 2 (two) times daily as needed (sores).     Marland Kitchen dexlansoprazole (DEXILANT) 60 MG capsule Take 60 mg by mouth at bedtime.     Marland Kitchen ezetimibe (ZETIA) 10 MG tablet Take 1 tablet (10 mg total) by mouth daily. 30 tablet 11  . FLUoxetine (PROZAC) 20 MG capsule 60 mg by mouth every morning    . folic acid (FOLVITE) 620 MCG tablet Take 400 mcg by mouth daily.      . furosemide (LASIX) 20 MG tablet Take 20mg s daily as needed for swelling    . gabapentin (NEURONTIN) 100 MG capsule Take by mouth.    . hydroxychloroquine (PLAQUENIL) 200 MG tablet Take by mouth daily.     Marland Kitchen latanoprost (XALATAN) 0.005 % ophthalmic solution Place 1 drop into both eyes at bedtime.     . lidocaine (XYLOCAINE) 2 % jelly Apply 1 application topically as needed. Apply to affected area 4 times daily as needed 30 mL 0  . Multiple Vitamin (MULTI-VITAMINS) TABS Take 1 tablet by mouth daily.     . Omega-3 Fatty Acids (FISH OIL PO) Take 1 capsule by mouth daily.     . predniSONE (DELTASONE) 5 MG tablet Take 5 mg by mouth daily with breakfast.    . simvastatin (ZOCOR) 80 MG tablet Take 80mg s at bedtime    . zolpidem (AMBIEN) 10 MG tablet Take 10 mg by mouth at bedtime as needed for sleep.      No current facility-administered medications for this visit.     Review of Systems Review of Systems  Constitutional: Negative.   Respiratory: Negative.   Cardiovascular: Negative.     Blood pressure (!) 156/86, pulse 74, resp. rate 18, height 5' 1.5" (1.562 m), weight 196 lb (88.9 kg).  Physical Exam Physical Exam  Constitutional: She is oriented to person, place, and time. She appears well-developed and well-nourished.  Musculoskeletal:       Hands:      Legs: Neurological: She is alert and oriented to person, place, and time.  Skin: Skin is warm, dry and intact.  Left thigh and groin incision well healed. Left 3rd finger nail dark nail bed  Psychiatric: Her behavior is normal.    Data Reviewed 11/19/2016 biopsy results from the melanoma, sentinel node and left labial lesion:DIAGNOSIS:  A. SKIN AND SOFT TISSUE, LEFT THIGH; EXCISION:  - NEGATIVE FOR RESIDUAL MELANOMA.  - BIOPSY SITE CHANGES.  - ALL MARGINS APPEAR CLEAR.   B. SENTINEL LYMPH NODE #1, LEFT INGUINAL; EXCISION:  - NEGATIVE FOR MALIGNANCY BY IMMUNOHISTOCHEMISTRY FOR SOX10.   C. LEFT LABIAL NODULE; EXCISION:  -  HIGH-GRADE SQUAMOUS INTRAEPITHELIAL LESION (HSIL, VIN 3).     Assessment    Doing well now 4 months status post wide excision of the left distal anterior thigh melanoma.     Plan    The patient needs to continue regular follow-up with her dermatologist for ongoing surveillance for her recently resected melanoma.  She'll continue to follow-up with GYN oncology regarding her high-grade squamous intraepithelial lesion resected from the inferior aspect of the left labia.      Follow up as needed. Check with dermatology regarding nail  bed.   HPI, Physical Exam, Assessment and Plan have been scribed under the direction and in the presence of Robert Bellow, MD.  Karie Fetch, RN I have completed the exam and reviewed the above documentation for accuracy and completeness.  I agree with the above.  Haematologist has been used and any errors in dictation or transcription are unintentional.  Hervey Ard, M.D., F.A.C.S.   Robert Bellow 03/18/2017, 8:42 PM

## 2017-03-18 NOTE — Patient Instructions (Signed)
The patient is aware to call back for any questions or concerns.  

## 2017-03-31 ENCOUNTER — Other Ambulatory Visit: Payer: Self-pay

## 2017-03-31 ENCOUNTER — Ambulatory Visit (INDEPENDENT_AMBULATORY_CARE_PROVIDER_SITE_OTHER): Payer: Medicare Other

## 2017-03-31 DIAGNOSIS — R0609 Other forms of dyspnea: Secondary | ICD-10-CM | POA: Diagnosis not present

## 2017-03-31 DIAGNOSIS — M349 Systemic sclerosis, unspecified: Secondary | ICD-10-CM

## 2017-04-08 ENCOUNTER — Ambulatory Visit
Admission: RE | Admit: 2017-04-08 | Discharge: 2017-04-08 | Disposition: A | Discharge status: Home / Self Care | Payer: Medicare Other | Source: Ambulatory Visit | Attending: Physician Assistant | Admitting: Physician Assistant

## 2017-04-08 ENCOUNTER — Other Ambulatory Visit: Payer: Self-pay | Admitting: Physician Assistant

## 2017-04-08 DIAGNOSIS — R51 Headache: Secondary | ICD-10-CM | POA: Insufficient documentation

## 2017-04-08 DIAGNOSIS — R519 Headache, unspecified: Secondary | ICD-10-CM

## 2017-04-14 ENCOUNTER — Ambulatory Visit: Payer: Medicare Other

## 2017-04-20 ENCOUNTER — Ambulatory Visit: Payer: Medicare Other | Admitting: Pulmonary Disease

## 2017-04-30 ENCOUNTER — Telehealth: Payer: Self-pay | Admitting: Cardiovascular Disease

## 2017-04-30 NOTE — Telephone Encounter (Signed)
Sent bill in question to charge correction to change primary diagnosis to R06.02 (shortness of breath).  Will contact patient make her aware this is being corrected.

## 2017-04-30 NOTE — Telephone Encounter (Signed)
Pt calling stating she received a notice from her insurance company stating they her visit with Dr Rockey Situ and her Brantley Fling was not covered by insurance  Medicare told her that it was coded wrong   She is due for another appointment with Dr Rockey Situ but she would like to fix this issue before scheduling another appointment with [place]

## 2017-05-21 ENCOUNTER — Ambulatory Visit: Payer: Medicare Other

## 2017-06-02 ENCOUNTER — Ambulatory Visit
Admission: RE | Admit: 2017-06-02 | Discharge: 2017-06-02 | Disposition: A | Discharge status: Home / Self Care | Payer: Medicare Other | Source: Ambulatory Visit | Attending: Pulmonary Disease | Admitting: Pulmonary Disease

## 2017-06-02 ENCOUNTER — Ambulatory Visit (HOSPITAL_COMMUNITY): Payer: Medicare Other

## 2017-06-02 DIAGNOSIS — M349 Systemic sclerosis, unspecified: Secondary | ICD-10-CM

## 2017-06-02 DIAGNOSIS — J8410 Pulmonary fibrosis, unspecified: Secondary | ICD-10-CM | POA: Insufficient documentation

## 2017-06-03 ENCOUNTER — Ambulatory Visit (INDEPENDENT_AMBULATORY_CARE_PROVIDER_SITE_OTHER): Payer: Medicare Other | Admitting: Pulmonary Disease

## 2017-06-03 ENCOUNTER — Encounter: Payer: Self-pay | Admitting: Pulmonary Disease

## 2017-06-03 VITALS — BP 122/84 | HR 71 | Ht 61.5 in | Wt 193.0 lb

## 2017-06-03 DIAGNOSIS — R0609 Other forms of dyspnea: Secondary | ICD-10-CM

## 2017-06-03 DIAGNOSIS — I25119 Atherosclerotic heart disease of native coronary artery with unspecified angina pectoris: Secondary | ICD-10-CM | POA: Diagnosis not present

## 2017-06-03 DIAGNOSIS — J8410 Pulmonary fibrosis, unspecified: Secondary | ICD-10-CM | POA: Diagnosis not present

## 2017-06-03 NOTE — Progress Notes (Signed)
PULMONARY OFFICE FOLLOW UP  NOTE  Requesting MD/Service: Gollan. Primary MD: Doy Hutching. RheumJefm Bryant Date of initial consultation: 02/23/17 Reason for consultation: Scleroderma, pulmonary fibrosis, progressive DOE  PT PROFILE: 71 y.o. 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 Previously followed by Dr Raul Del  DATA: PFTs 2015: No obstruction, TLC 90%, DLCO 72% CT chest 10/01/15: Dilated esophagus consistent with the given history of scleroderma. This is also stable from the most recent prior CT. Lungs show changes of interstitial fibrosis, likely usual interstitial fibrosis, predominantly in the lower lungs, similar to the most recent prior CT CXR 11/18/16: Chronic interstitial changes are present at the lung bases 6MWT 03/04/17: 168 m, no desaturation. Patient stated that she felt like she couldn't get any air and started wheezing. Echocardiogram 03/31/17: Mild concentric LV hypertrophy with LVEF 55-60%. No evidence of pulmonary hypertension. PFTs 06/02/17 FVC: 2.46 L (97 %pred), FEV1: 1.83 L ( 91 %pred), FEV1/FVC: 74% , TLC:  4.06 L ( 97 %pred), DLCO 54%pred    SUBJ:  This is a routine reevaluation. She was last seen in March as initial consultation. I ordered an echocardiogram, PFTs and 6 minute walk. She is here to review the results of those tests. This appointment was rescheduled because she suffered a fall and rib injury. Since last visit her therapy has been changed from methotrexate to Plaquenil. She continues to have severe exertional dyspnea (class 3-4). She denies pleuritic or anginal chest pain. She has no fevers or hemoptysis. She denies lower extremity edema and calf tenderness. There is little day-to-day variation in her shortness of breath.   Vitals:   06/03/17 1150  BP: 122/84  Pulse: 71  SpO2: 98%  Weight: 193 lb (87.5 kg)  Height: 5' 1.5" (1.562 m)  RA  EXAM:  Gen: NAD at  rest HEENT: No new findings Neck: Supple without adenopathy. JVP not visualized Lungs: breath sounds full with minimal bibasilar crackles, no wheezes Cardiovascular: RRR, no murmurs noted Abdomen: Soft, nontender, normal BS Ext: Skin and digital changes consistent with scleroderma Neuro: CNs grossly intact, motor and sensory intact Skin: innumerable telangiectasias   DATA:   BMP Latest Ref Rng & Units 04/06/2012  Creatinine 0.60 - 1.30 mg/dL 0.79    No flowsheet data found.  CXR (06/02/17):  Vague, patchy bilateral interstitial opacities. These are possibly slightly worse than prior films.  IMPRESSION:     ICD-10-CM   1. Class IV, disabling exertional dyspnea - poorly explained by objective R06.09 AMB referral to pulmonary rehabilitation  2. 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 Her very severe shortness of breath is out of proportion to radiographic and PFT findings. I suspect that she would benefit from pulmonary rehabilitation for severe deconditioning. There is a possible hint of obstruction on the PFTs. It is possible that she might benefit from bronchodilator therapy.   PLAN:  The above data including echocardiogram results, PFTs and 6 minute walk were reviewed in detail with her  1) I provided a sample of Breo inhaler. If she believes that this is beneficial to her shortness breath, she is to call here and we will prescribe it 2) pulmonary rehabilitation referral has been made 3) follow-up in 3-4 months  Merton Border, MD PCCM service Mobile [phone] Pager [phone] 06/04/2017

## 2017-06-03 NOTE — Patient Instructions (Signed)
Your echocardiogram looked very good with no significant abnormality Your lung function tests were mildly abnormal Shortness of breath is out of proportion to the abnormalities on lung function testing I suspect there is a component of deconditioning We will begin a trial of Breo - samples provided. If you feel that it is beneficial, contact us and we will write a prescription for this We will enroll you in pulmonary rehabilitation program Follow-up in 3-4 months

## 2017-06-03 NOTE — Progress Notes (Signed)
Patient ID: Erin [name], female   DOB: 09/27/1946, 71 y.o.   MRN: [id number]   Patient seen in the office today and instructed on use of Breo.  Patient expressed understanding and demonstrated technique.  Oscar La, LPN

## 2017-06-23 ENCOUNTER — Encounter: Payer: Medicare Other | Attending: Pulmonary Disease

## 2017-06-23 VITALS — Ht 63.0 in | Wt 194.1 lb

## 2017-06-23 DIAGNOSIS — R0609 Other forms of dyspnea: Secondary | ICD-10-CM | POA: Diagnosis not present

## 2017-06-23 DIAGNOSIS — J8410 Pulmonary fibrosis, unspecified: Secondary | ICD-10-CM | POA: Diagnosis present

## 2017-06-23 NOTE — Patient Instructions (Signed)
Patient Instructions  Patient Details  Name: Erin [name] MRN: [id number] Date of Birth: [birth year] Referring Provider:  Wilhelmina Mcardle, MD  Below are the personal goals you chose as well as exercise and nutrition goals. Our goal is to help you keep on track towards obtaining and maintaining your goals. We will be discussing your progress on these goals with you throughout the program.  Initial Exercise Prescription:     Initial Exercise Prescription - 06/23/17 1100      Date of Initial Exercise RX and Referring Provider   Date 06/23/17   Referring Provider Simonds     Treadmill   MPH 1.3   Grade 0   Minutes 15   METs 2     NuStep   Level 1   SPM 80   Minutes 15   METs 2     REL-XR   Level 1   Speed 50   Minutes 15   METs 2     Biostep-RELP   Level 2   SPM 50   Minutes 15   METs 2     Prescription Details   Frequency (times per week) 3   Duration Progress to 45 minutes of aerobic exercise without signs/symptoms of physical distress     Intensity   THRR 40-80% of Max Heartrate 115-137   Ratings of Perceived Exertion 11-15   Perceived Dyspnea 0-4     Resistance Training   Training Prescription Yes   Weight 2   Reps 10-15      Exercise Goals: Frequency: Be able to perform aerobic exercise three times per week working toward 3-5 days per week.  Intensity: Work with a perceived exertion of 11 (fairly light) - 15 (hard) as tolerated. Follow your new exercise prescription and watch for changes in prescription as you progress with the program. Changes will be reviewed with you when they are made.  Duration: You should be able to do 30 minutes of continuous aerobic exercise in addition to a 5 minute warm-up and a 5 minute cool-down routine.  Nutrition Goals: Your personal nutrition goals will be established when you do your nutrition analysis with the dietician.  The following are nutrition guidelines to follow: Cholesterol < 200mg /day Sodium <  1500mg /day Fiber: Women over 50 yrs - 21 grams per day  Personal Goals:     Personal Goals and Risk Factors at Admission - 06/23/17 1238      Core Components/Risk Factors/Patient Goals on Admission    Weight Management Obesity;Yes;Weight Loss   Intervention Weight Management: Develop a combined nutrition and exercise program designed to reach desired caloric intake, while maintaining appropriate intake of nutrient and fiber, sodium and fats, and appropriate energy expenditure required for the weight goal.;Obesity: Provide education and appropriate resources to help participant work on and attain dietary goals.;Weight Management: Provide education and appropriate resources to help participant work on and attain dietary goals.;Weight Management/Obesity: Establish reasonable short term and long term weight goals.   Admit Weight 194 lb 1.6 oz (88 kg)   Goal Weight: Short Term 190 lb (86.2 kg)   Goal Weight: Long Term 175 lb (79.4 kg)   Expected Outcomes Understanding of distribution of calorie intake throughout the day with the consumption of 4-5 meals/snacks;Understanding recommendations for meals to include 15-35% energy as protein, 25-35% energy from fat, 35-60% energy from carbohydrates, less than 200mg  of dietary cholesterol, 20-35 gm of total fiber daily;Weight Loss: Understanding of general recommendations for a balanced deficit meal plan, which promotes  1-2 lb weight loss per week and includes a negative energy balance of [phone] kcal/d;Weight Maintenance: Understanding of the daily nutrition guidelines, which includes 25-35% calories from fat, 7% or less cal from saturated fats, less than 200mg  cholesterol, less than 1.5gm of sodium, & 5 or more servings of fruits and vegetables daily;Short Term: Continue to assess and modify interventions until short term weight is achieved;Long Term: Adherence to nutrition and physical activity/exercise program aimed toward attainment of established weight goal    Tobacco Cessation --  never a smoker   Improve shortness of breath with ADL's Yes   Intervention Provide education, individualized exercise plan and daily activity instruction to help decrease symptoms of SOB with activities of daily living.   Expected Outcomes Short Term: Achieves a reduction of symptoms when performing activities of daily living.   Develop more efficient breathing techniques such as purse lipped breathing and diaphragmatic breathing; and practicing self-pacing with activity Yes   Intervention Provide education, demonstration and support about specific breathing techniuqes utilized for more efficient breathing. Include techniques such as pursed lipped breathing, diaphragmatic breathing and self-pacing activity.   Expected Outcomes Short Term: Participant will be able to demonstrate and use breathing techniques as needed throughout daily activities.   Increase knowledge of respiratory medications and ability to use respiratory devices properly  Yes   Intervention Provide education and demonstration as needed of appropriate use of medications, inhalers, and oxygen therapy.   Expected Outcomes Short Term: Achieves understanding of medications use. Understands that oxygen is a medication prescribed by physician. Demonstrates appropriate use of inhaler and oxygen therapy.      Tobacco Use Initial Evaluation: History  Smoking Status  . Never Smoker  Smokeless Tobacco  . Never Used    Copy of goals given to participant.

## 2017-06-23 NOTE — Progress Notes (Signed)
Daily Session Note  Patient Details  Name: Erin [name] MRN: [id number] Date of Birth: 07/21/1946 Referring Provider:     Pulmonary Rehab from 06/23/2017 in [facility]  Referring Provider  Simonds      Encounter Date: 06/23/2017  Check In:     Session Check In - 06/23/17 1234      Check-In   Location [facility]   Staff Present Amanda Sommer, BA, ACSM CEP, Exercise Physiologist;Joseph Hood RCP,RRT,BSRT   Supervising physician immediately available to respond to emergencies [facility] immediately available ER MD   Physician(s) Dr. Veronese and Robinson   Tobacco Cessation No Change  Never a Smoker   Warm-up and Cool-down Not performed (comment)  Medical Evaluation   Resistance Training Performed No   VAD Patient? No     Pain Assessment   Currently in Pain? Yes   Pain Score 6    Pain Location Back   Pain Orientation Lower   Pain Descriptors / Indicators Aching;Constant;Dull   Pain Type Acute pain   Pain Onset More than a month ago   Pain Frequency Constant   Multiple Pain Sites No         History  Smoking Status  . Never Smoker  Smokeless Tobacco  . Never Used    Goals Met:  Proper associated with RPD/PD & O2 Sat Exercise tolerated well Personal goals reviewed Queuing for purse lip breathing  Goals Unmet:  Not Applicable  Comments: Medical evaluation completed. Created initial ITP   Dr. Mark Miller is Medical Director for [facility] and [facility]. 

## 2017-06-23 NOTE — Progress Notes (Signed)
Pulmonary Individual Treatment Plan  Patient Details  Name: Erin [name] MRN: [id number] Date of Birth: 25-Sep-1946 Referring Provider:     Pulmonary Rehab from 06/23/2017 in [facility] Cardiac and Pulmonary Rehab  Referring Provider  Simonds      Initial Encounter Date:    Pulmonary Rehab from 06/23/2017 in [facility] Cardiac and Pulmonary Rehab  Date  06/23/17  Referring Provider  Simonds      Visit Diagnosis: Pulmonary fibrosis ([place])  Patient's Home Medications on Admission:  Current Outpatient Prescriptions:  .  ALPRAZolam (XANAX) 0.5 MG tablet, Take 0.17ms twice daily as needed for anxiety, Disp: , Rfl:  .  amLODipine (NORVASC) 5 MG tablet, 5 mg bid, Disp: , Rfl:  .  aspirin EC 81 MG tablet, Take 81 mg by mouth at bedtime. , Disp: , Rfl:  .  cetirizine (ZYRTEC) 10 MG tablet, Take 10 mg by mouth daily as needed for allergies., Disp: , Rfl:  .  Clobetasol Prop Emollient Base (CLOBETASOL PROPIONATE E) 0.05 % emollient cream, Apply 1 application topically 2 (two) times daily as needed (sores). , Disp: , Rfl:  .  dexlansoprazole (DEXILANT) 60 MG capsule, Take 60 mg by mouth at bedtime. , Disp: , Rfl:  .  ezetimibe (ZETIA) 10 MG tablet, Take 1 tablet (10 mg total) by mouth daily., Disp: 30 tablet, Rfl: 11 .  FLUoxetine (PROZAC) 20 MG capsule, 60 mg by mouth every morning, Disp: , Rfl:  .  folic acid (FOLVITE) 4672MCG tablet, Take 400 mcg by mouth daily. , Disp: , Rfl:  .  furosemide (LASIX) 20 MG tablet, Take 247m daily as needed for swelling, Disp: , Rfl:  .  gabapentin (NEURONTIN) 100 MG capsule, Take by mouth., Disp: , Rfl:  .  hydroxychloroquine (PLAQUENIL) 200 MG tablet, Take by mouth daily. , Disp: , Rfl:  .  latanoprost (XALATAN) 0.005 % ophthalmic solution, Place 1 drop into both eyes at bedtime. , Disp: , Rfl:  .  lidocaine (XYLOCAINE) 2 % jelly, Apply 1 application topically as needed. Apply to affected area 4 times daily as needed, Disp: 30 mL, Rfl: 0 .  Multiple Vitamin  (MULTI-VITAMINS) TABS, Take 1 tablet by mouth daily. , Disp: , Rfl:  .  Omega-3 Fatty Acids (FISH OIL PO), Take 1 capsule by mouth daily. , Disp: , Rfl:  .  predniSONE (DELTASONE) 5 MG tablet, Take 5 mg by mouth daily with breakfast., Disp: , Rfl:  .  simvastatin (ZOCOR) 80 MG tablet, Take 8039mat bedtime, Disp: , Rfl:  .  zolpidem (AMBIEN) 10 MG tablet, Take 10 mg by mouth at bedtime as needed for sleep. , Disp: , Rfl:   Past Medical History: Past Medical History:  Diagnosis Date  . Anemia    h/o  . Cancer ([place]  melanoma  . Coronary artery disease   . DDD (degenerative disc disease), lumbar   . Dysphagia   . Dyspnea    with exertion-unable to walk a mile without getting sob- dr sparks set pt up to see Dr GolRockey Situter 11-19-16 surgery  . Esophageal dysmotility   . Fibrocystic breast disease   . GERD (gastroesophageal reflux disease)   . Heart murmur    h/o   . Hyperlipidemia   . Hypertension   . Mitral regurgitation   . Osteopenia   . Pulmonary fibrosis ([place]  per Dr FleRaul Del RA (rheumatoid arthritis) ([place] . Raynaud's disease   . Scleroderma ([place]  Tobacco Use: History  Smoking Status  . Never Smoker  Smokeless Tobacco  . Never Used    Labs: Recent Review Flowsheet Data    There is no flowsheet data to display.       ADL UCSD:     Pulmonary Assessment Scores    Row Name 06/23/17 1241         ADL UCSD   ADL Phase Entry     SOB Score total 82     Rest 2     Walk 3     Stairs 4     Bath 3     Dress 3     Shop 5       mMRC Score   mMRC Score 2        Pulmonary Function Assessment:     Pulmonary Function Assessment - 06/23/17 1240      Pulmonary Function Tests   FVC% 97 %   FEV1% 91 %   FEV1/FVC Ratio 74   RV% 93 %   DLCO% 54 %      Exercise Target Goals: Date: 06/23/17  Exercise Program Goal: Individual exercise prescription set with THRR, safety & activity barriers. Participant demonstrates ability to understand and  report RPE using BORG scale, to self-measure pulse accurately, and to acknowledge the importance of the exercise prescription.  Exercise Prescription Goal: Starting with aerobic activity 30 plus minutes a day, 3 days per week for initial exercise prescription. Provide home exercise prescription and guidelines that participant acknowledges understanding prior to discharge.  Activity Barriers & Risk Stratification:   6 Minute Walk:     6 Minute Walk    Row Name 06/23/17 1152         6 Minute Walk   Phase Initial     Distance 845 feet     Walk Time 5 minutes     # of Rest Breaks 3     MPH 1.92     METS 2.02     RPE 15     Perceived Dyspnea  3     VO2 Peak 7.07     Symptoms Yes (comment)     Comments back pain 6/10 - has raynauds so SaO2 may not be accurate     Resting HR 94 bpm     Resting BP 134/66     Max Ex. HR 117 bpm     Max Ex. BP 164/76       Interval HR   Baseline HR 94     2 Minute HR 115     3 Minute HR 117     4 Minute HR 115     5 Minute HR 113     6 Minute HR 114     2 Minute Post HR 97     Interval Heart Rate? Yes       Interval Oxygen   Interval Oxygen? Yes     Baseline Oxygen Saturation % 98 %     Baseline Liters of Oxygen 0 L     1 Minute Liters of Oxygen 0 L     2 Minute Oxygen Saturation % 74 %   has Raynauds - Sa02 possibly inaccurate     2 Minute Liters of Oxygen 0 L     3 Minute Oxygen Saturation % 87 %     3 Minute Liters of Oxygen 0 L     4 Minute Oxygen Saturation % 80 %     4  Minute Liters of Oxygen 0 L     5 Minute Oxygen Saturation % 84 %     5 Minute Liters of Oxygen 0 L     6 Minute Oxygen Saturation % 92 %     6 Minute Liters of Oxygen 0 L     2 Minute Post Oxygen Saturation % 95 %     2 Minute Post Liters of Oxygen 0 L       Oxygen Initial Assessment:     Oxygen Initial Assessment - 06/23/17 1240      Home Oxygen   Home Oxygen Device None   Sleep Oxygen Prescription None   Home Exercise Oxygen Prescription None    Home at Rest Exercise Oxygen Prescription None     Program Oxygen Prescription   Program Oxygen Prescription None     Intervention   Short Term Goals To learn and understand importance of monitoring SPO2 with pulse oximeter and demonstrate accurate use of the pulse oximeter.;To Learn and understand importance of maintaining oxygen saturations>88%;To learn and demonstrate proper purse lipped breathing techniques or other breathing techniques.;To learn and demonstrate proper use of respiratory medications   Long  Term Goals Maintenance of O2 saturations>88%;Compliance with respiratory medication;Demonstrates proper use of MDI's;Exhibits proper breathing techniques, such as purse lipped breathing or other method taught during program session;Verbalizes importance of monitoring SPO2 with pulse oximeter and return demonstration      Oxygen Re-Evaluation:   Oxygen Discharge (Final Oxygen Re-Evaluation):   Initial Exercise Prescription:     Initial Exercise Prescription - 06/23/17 1100      Date of Initial Exercise RX and Referring Provider   Date 06/23/17   Referring Provider Simonds     Treadmill   MPH 1.3   Grade 0   Minutes 15   METs 2     NuStep   Level 1   SPM 80   Minutes 15   METs 2     REL-XR   Level 1   Speed 50   Minutes 15   METs 2     Biostep-RELP   Level 2   SPM 50   Minutes 15   METs 2     Prescription Details   Frequency (times per week) 3   Duration Progress to 45 minutes of aerobic exercise without signs/symptoms of physical distress     Intensity   THRR 40-80% of Max Heartrate 115-137   Ratings of Perceived Exertion 11-15   Perceived Dyspnea 0-4     Resistance Training   Training Prescription Yes   Weight 2   Reps 10-15      Perform Capillary Blood Glucose checks as needed.  Exercise Prescription Changes:   Exercise Comments:   Exercise Goals and Review:     Exercise Goals    Row Name 06/23/17 1151             Exercise  Goals   Increase Physical Activity Yes       Intervention Provide advice, education, support and counseling about physical activity/exercise needs.;Develop an individualized exercise prescription for aerobic and resistive training based on initial evaluation findings, risk stratification, comorbidities and participant's personal goals.       Expected Outcomes Achievement of increased cardiorespiratory fitness and enhanced flexibility, muscular endurance and strength shown through measurements of functional capacity and personal statement of participant.       Increase Strength and Stamina Yes       Intervention Provide advice, education, support and  counseling about physical activity/exercise needs.;Develop an individualized exercise prescription for aerobic and resistive training based on initial evaluation findings, risk stratification, comorbidities and participant's personal goals.       Expected Outcomes Achievement of increased cardiorespiratory fitness and enhanced flexibility, muscular endurance and strength shown through measurements of functional capacity and personal statement of participant.          Exercise Goals Re-Evaluation :   Discharge Exercise Prescription (Final Exercise Prescription Changes):   Nutrition:  Target Goals: Understanding of nutrition guidelines, daily intake of sodium 1500mg , cholesterol 200mg , calories 30% from fat and 7% or less from saturated fats, daily to have 5 or more servings of fruits and vegetables.  Biometrics:     Pre Biometrics - 06/23/17 1151      Pre Biometrics   Height 5\' 3"  (1.6 m)   Weight 194 lb 1.6 oz (88 kg)   Waist Circumference 40.75 inches   Hip Circumference 47.75 inches   Waist to Hip Ratio 0.85 %   BMI (Calculated) 34.5       Nutrition Therapy Plan and Nutrition Goals:     Nutrition Therapy & Goals - 06/23/17 1243      Nutrition Therapy   RD appointment defered Yes     Intervention Plan   Intervention  Prescribe, educate and counsel regarding individualized specific dietary modifications aiming towards targeted core components such as weight, hypertension, lipid management, diabetes, heart failure and other comorbidities.;Nutrition handout(s) given to patient.   Expected Outcomes Short Term Goal: Understand basic principles of dietary content, such as calories, fat, sodium, cholesterol and nutrients.;Long Term Goal: Adherence to prescribed nutrition plan.      Nutrition Discharge: Rate Your Plate Scores:   Nutrition Goals Re-Evaluation:   Nutrition Goals Discharge (Final Nutrition Goals Re-Evaluation):   Psychosocial: Target Goals: Acknowledge presence or absence of significant depression and/or stress, maximize coping skills, provide positive support system. Participant is able to verbalize types and ability to use techniques and skills needed for reducing stress and depression.   Initial Review & Psychosocial Screening:     Initial Psych Review & Screening - 06/23/17 1244      Initial Review   Current issues with Current Depression;History of Depression;Current Stress Concerns   Source of Stress Concerns Family;Unable to perform yard/household activities   Comments One of her two sons died in the past, One of her other sons is Bipolar and she states that he can be stressful. Patient lives alone and sometimes she thinks about the future and not being able to take care of herself     [place]? Yes   Strains Intra-family strains   Comments son is a good source of support but is Bipolar     Barriers   Psychosocial barriers to participate in program Psychosocial barriers identified (see note);The patient should benefit from training in stress management and relaxation.  See PHQ and QOL scores     Screening Interventions   Interventions Encouraged to exercise;To provide support and resources with identified psychosocial needs;Program counselor  consult;Yes   Expected Outcomes Short Term goal: Utilizing psychosocial counselor, staff and physician to assist with identification of specific Stressors or current issues interfering with healing process. Setting desired goal for each stressor or current issue identified.;Long Term goal: The participant improves quality of Life and PHQ9 Scores as seen by post scores and/or verbalization of changes;Short Term goal: Identification and review with participant of any Quality of Life or Depression concerns found  by scoring the questionnaire.;Long Term Goal: Stressors or current issues are controlled or eliminated.      Quality of Life Scores:     Quality of Life - 06/23/17 1246      Quality of Life Scores   Health/Function Pre 10.78 %   Socioeconomic Pre 18.21 %   Psych/Spiritual Pre 18.29 %   Family Pre 6 %   GLOBAL Pre 13.52 %      PHQ-9: Recent Review Flowsheet Data    Depression screen [facility] 2/9 06/23/2017   Decreased Interest 1   Down, Depressed, Hopeless 1   PHQ - 2 Score 2   Altered sleeping 1   Tired, decreased energy 3   Change in appetite 3   Feeling bad or failure about yourself  1   Trouble concentrating 2   Moving slowly or fidgety/restless 1   Suicidal thoughts 1   PHQ-9 Score 14   Difficult doing work/chores Extremely dIfficult     Interpretation of Total Score  Total Score Depression Severity:  1-4 = Minimal depression, 5-9 = Mild depression, 10-14 = Moderate depression, 15-19 = Moderately severe depression, 20-27 = Severe depression   Psychosocial Evaluation and Intervention:   Psychosocial Re-Evaluation:   Psychosocial Discharge (Final Psychosocial Re-Evaluation):   Education: Education Goals: Education classes will be provided on a weekly basis, covering required topics. Participant will state understanding/return demonstration of topics presented.  Learning Barriers/Preferences:     Learning Barriers/Preferences - 06/23/17 1240      Learning  Barriers/Preferences   Learning Barriers None   Learning Preferences None      Education Topics: Initial Evaluation Education: - Verbal, written and demonstration of respiratory meds, RPE/PD scales, oximetry and breathing techniques. Instruction on use of nebulizers and MDIs: cleaning and proper use, rinsing mouth with steroid doses and importance of monitoring MDI activations.   Pulmonary Rehab from 06/23/2017 in [facility] Cardiac and Pulmonary Rehab  Date  06/23/17  Educator  [facility]  Instruction Review Code  2- meets goals/outcomes      General Nutrition Guidelines/Fats and Fiber: -Group instruction provided by verbal, written material, models and posters to present the general guidelines for heart healthy nutrition. Gives an explanation and review of dietary fats and fiber.   Controlling Sodium/Reading Food Labels: -Group verbal and written material supporting the discussion of sodium use in heart healthy nutrition. Review and explanation with models, verbal and written materials for utilization of the food label.   Exercise Physiology & Risk Factors: - Group verbal and written instruction with models to review the exercise physiology of the cardiovascular system and associated critical values. Details cardiovascular disease risk factors and the goals associated with each risk factor.   Aerobic Exercise & Resistance Training: - Gives group verbal and written discussion on the health impact of inactivity. On the components of aerobic and resistive training programs and the benefits of this training and how to safely progress through these programs.   Flexibility, Balance, General Exercise Guidelines: - Provides group verbal and written instruction on the benefits of flexibility and balance training programs. Provides general exercise guidelines with specific guidelines to those with heart or lung disease. Demonstration and skill practice provided.   Stress Management: - Provides group  verbal and written instruction about the health risks of elevated stress, cause of high stress, and healthy ways to reduce stress.   Depression: - Provides group verbal and written instruction on the correlation between heart/lung disease and depressed mood, treatment options, and the stigmas associated  with seeking treatment.   Exercise & Equipment Safety: - Individual verbal instruction and demonstration of equipment use and safety with use of the equipment.   Infection Prevention: - Provides verbal and written material to individual with discussion of infection control including proper hand washing and proper equipment cleaning during exercise session.   Falls Prevention: - Provides verbal and written material to individual with discussion of falls prevention and safety.   Diabetes: - Individual verbal and written instruction to review signs/symptoms of diabetes, desired ranges of glucose level fasting, after meals and with exercise. Advice that pre and post exercise glucose checks will be done for 3 sessions at entry of program.   Chronic Lung Diseases: - Group verbal and written instruction to review new updates, new respiratory medications, new advancements in procedures and treatments. Provide informative websites and "800" numbers of self-education.   Lung Procedures: - Group verbal and written instruction to describe testing methods done to diagnose lung disease. Review the outcome of test results. Describe the treatment choices: Pulmonary Function Tests, ABGs and oximetry.   Energy Conservation: - Provide group verbal and written instruction for methods to conserve energy, plan and organize activities. Instruct on pacing techniques, use of adaptive equipment and posture/positioning to relieve shortness of breath.   Triggers: - Group verbal and written instruction to review types of environmental controls: home humidity, furnaces, filters, dust mite/pet prevention, HEPA  vacuums. To discuss weather changes, air quality and the benefits of nasal washing.   Exacerbations: - Group verbal and written instruction to provide: warning signs, infection symptoms, calling MD promptly, preventive modes, and value of vaccinations. Review: effective airway clearance, coughing and/or vibration techniques. Create an Sports administrator.   Oxygen: - Individual and group verbal and written instruction on oxygen therapy. Includes supplement oxygen, available portable oxygen systems, continuous and intermittent flow rates, oxygen safety, concentrators, and Medicare reimbursement for oxygen.   Pulmonary Rehab from 06/23/2017 in [facility] Cardiac and Pulmonary Rehab  Date  06/23/17  Educator  [facility]  Instruction Review Code  2- meets goals/outcomes      Respiratory Medications: - Group verbal and written instruction to review medications for lung disease. Drug class, frequency, complications, importance of spacers, rinsing mouth after steroid MDI's, and proper cleaning methods for nebulizers.   Pulmonary Rehab from 06/23/2017 in [facility] Cardiac and Pulmonary Rehab  Date  06/23/17  Educator  [facility]  Instruction Review Code  2- meets goals/outcomes      AED/CPR: - Group verbal and written instruction with the use of models to demonstrate the basic use of the AED with the basic ABC's of resuscitation.   Breathing Retraining: - Provides individuals verbal and written instruction on purpose, frequency, and proper technique of diaphragmatic breathing and pursed-lipped breathing. Applies individual practice skills.   Pulmonary Rehab from 06/23/2017 in [facility] Cardiac and Pulmonary Rehab  Date  06/23/17  Educator  [facility]  Instruction Review Code  2- meets goals/outcomes      Anatomy and Physiology of the Lungs: - Group verbal and written instruction with the use of models to provide basic lung anatomy and physiology related to function, structure and complications of lung disease.   Heart Failure: -  Group verbal and written instruction on the basics of heart failure: signs/symptoms, treatments, explanation of ejection fraction, enlarged heart and cardiomyopathy.   Sleep Apnea: - Individual verbal and written instruction to review Obstructive Sleep Apnea. Review of risk factors, methods for diagnosing and types of masks and machines for OSA.  Anxiety: - Provides group, verbal and written instruction on the correlation between heart/lung disease and anxiety, treatment options, and management of anxiety.   Relaxation: - Provides group, verbal and written instruction about the benefits of relaxation for patients with heart/lung disease. Also provides patients with examples of relaxation techniques.   Knowledge Questionnaire Score:     Knowledge Questionnaire Score - 06/23/17 1240      Knowledge Questionnaire Score   Pre Score 8/10       Core Components/Risk Factors/Patient Goals at Admission:     Personal Goals and Risk Factors at Admission - 06/23/17 1238      Core Components/Risk Factors/Patient Goals on Admission    Weight Management Obesity;Yes;Weight Loss   Intervention Weight Management: Develop a combined nutrition and exercise program designed to reach desired caloric intake, while maintaining appropriate intake of nutrient and fiber, sodium and fats, and appropriate energy expenditure required for the weight goal.;Obesity: Provide education and appropriate resources to help participant work on and attain dietary goals.;Weight Management: Provide education and appropriate resources to help participant work on and attain dietary goals.;Weight Management/Obesity: Establish reasonable short term and long term weight goals.   Admit Weight 194 lb 1.6 oz (88 kg)   Goal Weight: Short Term 190 lb (86.2 kg)   Goal Weight: Long Term 175 lb (79.4 kg)   Expected Outcomes Understanding of distribution of calorie intake throughout the day with the consumption of 4-5  meals/snacks;Understanding recommendations for meals to include 15-35% energy as protein, 25-35% energy from fat, 35-60% energy from carbohydrates, less than 200mg  of dietary cholesterol, 20-35 gm of total fiber daily;Weight Loss: Understanding of general recommendations for a balanced deficit meal plan, which promotes 1-2 lb weight loss per week and includes a negative energy balance of [phone] kcal/d;Weight Maintenance: Understanding of the daily nutrition guidelines, which includes 25-35% calories from fat, 7% or less cal from saturated fats, less than 200mg  cholesterol, less than 1.5gm of sodium, & 5 or more servings of fruits and vegetables daily;Short Term: Continue to assess and modify interventions until short term weight is achieved;Long Term: Adherence to nutrition and physical activity/exercise program aimed toward attainment of established weight goal   Tobacco Cessation --  never a smoker   Improve shortness of breath with ADL's Yes   Intervention Provide education, individualized exercise plan and daily activity instruction to help decrease symptoms of SOB with activities of daily living.   Expected Outcomes Short Term: Achieves a reduction of symptoms when performing activities of daily living.   Develop more efficient breathing techniques such as purse lipped breathing and diaphragmatic breathing; and practicing self-pacing with activity Yes   Intervention Provide education, demonstration and support about specific breathing techniuqes utilized for more efficient breathing. Include techniques such as pursed lipped breathing, diaphragmatic breathing and self-pacing activity.   Expected Outcomes Short Term: Participant will be able to demonstrate and use breathing techniques as needed throughout daily activities.   Increase knowledge of respiratory medications and ability to use respiratory devices properly  Yes   Intervention Provide education and demonstration as needed of appropriate use  of medications, inhalers, and oxygen therapy.   Expected Outcomes Short Term: Achieves understanding of medications use. Understands that oxygen is a medication prescribed by physician. Demonstrates appropriate use of inhaler and oxygen therapy.      Core Components/Risk Factors/Patient Goals Review:    Core Components/Risk Factors/Patient Goals at Discharge (Final Review):    ITP Comments:     ITP Comments  [place] Name 06/23/17 1316           ITP Comments Medical Review completed. Documentation for diagnosis can be found in [facility] encounter date 06/03/17          Comments: Initial ITP

## 2017-06-29 ENCOUNTER — Encounter: Payer: Medicare Other | Admitting: *Deleted

## 2017-06-29 DIAGNOSIS — J8410 Pulmonary fibrosis, unspecified: Secondary | ICD-10-CM

## 2017-06-29 DIAGNOSIS — R0609 Other forms of dyspnea: Secondary | ICD-10-CM | POA: Diagnosis not present

## 2017-06-29 NOTE — Progress Notes (Signed)
Daily Session Note  Patient Details  Name: Erin [name] MRN: [id number] Date of Birth: June 30, 1946 Referring Provider:     Pulmonary Rehab from 06/23/2017 in [facility] Cardiac and Pulmonary Rehab  Referring Provider  [facility]      Encounter Date: 06/29/2017  Check In:     Session Check In - 06/29/17 1006      Check-In   Location [facility]-Cardiac & Pulmonary Rehab   Staff Present Justin Mend Jaci Carrel, BS, ACSM CEP, Exercise Physiologist;Amanda Oletta Darter, [place], ACSM CEP, Exercise Physiologist   Supervising physician immediately available to respond to emergencies [facility] immediately available ER MD   Physician(s) Dr. Cinda Quest and Dr. Mariea Clonts   Medication changes reported     No   Fall or balance concerns reported    No   Warm-up and Cool-down Performed as group-led instruction   Resistance Training Performed Yes   VAD Patient? No     Pain Assessment   Currently in Pain? No/denies   Multiple Pain Sites No         History  Smoking Status  . Never Smoker  Smokeless Tobacco  . Never Used    Goals Met:  Proper associated with RPD/PD & O2 Sat Exercise tolerated well Personal goals reviewed Strength training completed today  Goals Unmet:  Not Applicable  Comments: First full day of exercise!  Patient was oriented to gym and equipment including functions, settings, policies, and procedures.  Patient's individual exercise prescription and treatment plan were reviewed.  All starting workloads were established based on the results of the 6 minute walk test done at initial orientation visit.  The plan for exercise progression was also introduced and progression will be customized based on patient's performance and goals.    Dr. Emily Filbert is Medical Director for [facility] and [facility].

## 2017-07-01 DIAGNOSIS — J8410 Pulmonary fibrosis, unspecified: Secondary | ICD-10-CM

## 2017-07-01 DIAGNOSIS — R0609 Other forms of dyspnea: Secondary | ICD-10-CM | POA: Diagnosis not present

## 2017-07-01 NOTE — Progress Notes (Signed)
Daily Session Note  Patient Details  Name: Erin [name] MRN: [id number] Date of Birth: 31-Jul-1946 Referring Provider:     Pulmonary Rehab from 06/23/2017 in [facility] Cardiac and Pulmonary Rehab  Referring Provider  Simonds      Encounter Date: 07/01/2017  Check In:     Session Check In - 07/01/17 1030      Check-In   Location [facility]-Cardiac & Pulmonary Rehab   Staff Present Alberteen Sam, MA, ACSM RCEP, Exercise Physiologist;Amanda Oletta Darter, BA, ACSM CEP, Exercise Physiologist;Myalee Stengel Flavia Shipper   Supervising physician immediately available to respond to emergencies [facility] immediately available ER MD   Physician(s) Dr. Joni Fears and Burlene Arnt   Medication changes reported     No   Fall or balance concerns reported    No   Warm-up and Cool-down Performed as group-led instruction   Resistance Training Performed Yes   VAD Patient? No     Pain Assessment   Currently in Pain? No/denies   Multiple Pain Sites No         History  Smoking Status  . Never Smoker  Smokeless Tobacco  . Never Used    Goals Met:  Proper associated with RPD/PD & O2 Sat Independence with exercise equipment Exercise tolerated well Strength training completed today  Goals Unmet:  Not Applicable  Comments: Pt able to follow exercise prescription today without complaint.  Will continue to monitor for progression.   Dr. Emily Filbert is Medical Director for [facility] and [facility].

## 2017-07-03 DIAGNOSIS — R0609 Other forms of dyspnea: Secondary | ICD-10-CM | POA: Diagnosis not present

## 2017-07-03 DIAGNOSIS — J8410 Pulmonary fibrosis, unspecified: Secondary | ICD-10-CM

## 2017-07-03 NOTE — Progress Notes (Signed)
Daily Session Note  Patient Details  Name: Erin [name] MRN: [id number] Date of Birth: October 16, 1946 Referring Provider:     Pulmonary Rehab from 06/23/2017 in [facility] Cardiac and Pulmonary Rehab  Referring Provider  Simonds      Encounter Date: 07/03/2017  Check In:     Session Check In - 07/03/17 1016      Check-In   Location [facility]-Cardiac & Pulmonary Rehab   Staff Present Gerlene Burdock, RN, Vickki Hearing, BA, ACSM CEP, Exercise Physiologist;Shealee Yordy Flavia Shipper   Supervising physician immediately available to respond to emergencies [facility] immediately available ER MD   Physician(s) Dr. Kerman Passey and Jimmye Norman   Medication changes reported     No   Fall or balance concerns reported    No   Warm-up and Cool-down Performed as group-led instruction   Resistance Training Performed Yes   VAD Patient? No     Pain Assessment   Currently in Pain? No/denies   Multiple Pain Sites No         History  Smoking Status  . Never Smoker  Smokeless Tobacco  . Never Used    Goals Met:  Proper associated with RPD/PD & O2 Sat Independence with exercise equipment Exercise tolerated well Strength training completed today  Goals Unmet:  Not Applicable  Comments: Pt able to follow exercise prescription today without complaint.  Will continue to monitor for progression.   Dr. Emily Filbert is Medical Director for [facility] and [facility].

## 2017-07-06 ENCOUNTER — Encounter: Payer: Medicare Other | Admitting: *Deleted

## 2017-07-06 DIAGNOSIS — J8410 Pulmonary fibrosis, unspecified: Secondary | ICD-10-CM

## 2017-07-06 DIAGNOSIS — R0609 Other forms of dyspnea: Secondary | ICD-10-CM | POA: Diagnosis not present

## 2017-07-06 NOTE — Progress Notes (Signed)
Daily Session Note  Patient Details  Name: Erin [name] MRN: [id number] Date of Birth: Jul 18, 1946 Referring Provider:     Pulmonary Rehab from 06/23/2017 in [facility] Cardiac and Pulmonary Rehab  Referring Provider  Simonds      Encounter Date: 07/06/2017  Check In:     Session Check In - 07/06/17 1010      Check-In   Location [facility]-Cardiac & Pulmonary Rehab   Staff Present Justin Mend Jaci Carrel, BS, ACSM CEP, Exercise Physiologist;Amanda Oletta Darter, [place], ACSM CEP, Exercise Physiologist   Supervising physician immediately available to respond to emergencies [facility] immediately available ER MD   Physician(s) Dr. Shirl Harris and Dr. Quentin Cornwall   Medication changes reported     No   Fall or balance concerns reported    No   Warm-up and Cool-down Performed as group-led instruction   Resistance Training Performed Yes   VAD Patient? No     Pain Assessment   Currently in Pain? No/denies   Multiple Pain Sites No         History  Smoking Status  . Never Smoker  Smokeless Tobacco  . Never Used    Goals Met:  Proper associated with RPD/PD & O2 Sat Independence with exercise equipment Exercise tolerated well Strength training completed today  Goals Unmet:  Not Applicable  Comments: Pt able to follow exercise prescription today without complaint.  Will continue to monitor for progression.    Dr. Emily Filbert is Medical Director for [facility] and [facility].

## 2017-07-06 NOTE — Progress Notes (Signed)
Pulmonary Individual Treatment Plan  Patient Details  Name: Erin [name] MRN: [id number] Date of Birth: 1946-01-04 Referring Provider:     Pulmonary Rehab from 06/23/2017 in [facility] Cardiac and Pulmonary Rehab  Referring Provider  Simonds      Initial Encounter Date:    Pulmonary Rehab from 06/23/2017 in [facility] Cardiac and Pulmonary Rehab  Date  06/23/17  Referring Provider  Simonds      Visit Diagnosis: Pulmonary fibrosis ([place])  Patient's Home Medications on Admission:  Current Outpatient Prescriptions:  .  ALPRAZolam (XANAX) 0.5 MG tablet, Take 0.42ms twice daily as needed for anxiety, Disp: , Rfl:  .  amLODipine (NORVASC) 5 MG tablet, 5 mg bid, Disp: , Rfl:  .  aspirin EC 81 MG tablet, Take 81 mg by mouth at bedtime. , Disp: , Rfl:  .  cetirizine (ZYRTEC) 10 MG tablet, Take 10 mg by mouth daily as needed for allergies., Disp: , Rfl:  .  Clobetasol Prop Emollient Base (CLOBETASOL PROPIONATE E) 0.05 % emollient cream, Apply 1 application topically 2 (two) times daily as needed (sores). , Disp: , Rfl:  .  dexlansoprazole (DEXILANT) 60 MG capsule, Take 60 mg by mouth at bedtime. , Disp: , Rfl:  .  ezetimibe (ZETIA) 10 MG tablet, Take 1 tablet (10 mg total) by mouth daily., Disp: 30 tablet, Rfl: 11 .  FLUoxetine (PROZAC) 20 MG capsule, 60 mg by mouth every morning, Disp: , Rfl:  .  folic acid (FOLVITE) 4619MCG tablet, Take 400 mcg by mouth daily. , Disp: , Rfl:  .  furosemide (LASIX) 20 MG tablet, Take 249m daily as needed for swelling, Disp: , Rfl:  .  gabapentin (NEURONTIN) 100 MG capsule, Take by mouth., Disp: , Rfl:  .  hydroxychloroquine (PLAQUENIL) 200 MG tablet, Take by mouth daily. , Disp: , Rfl:  .  latanoprost (XALATAN) 0.005 % ophthalmic solution, Place 1 drop into both eyes at bedtime. , Disp: , Rfl:  .  lidocaine (XYLOCAINE) 2 % jelly, Apply 1 application topically as needed. Apply to affected area 4 times daily as needed, Disp: 30 mL, Rfl: 0 .  Multiple Vitamin  (MULTI-VITAMINS) TABS, Take 1 tablet by mouth daily. , Disp: , Rfl:  .  Omega-3 Fatty Acids (FISH OIL PO), Take 1 capsule by mouth daily. , Disp: , Rfl:  .  predniSONE (DELTASONE) 5 MG tablet, Take 5 mg by mouth daily with breakfast., Disp: , Rfl:  .  simvastatin (ZOCOR) 80 MG tablet, Take 8057mat bedtime, Disp: , Rfl:  .  zolpidem (AMBIEN) 10 MG tablet, Take 10 mg by mouth at bedtime as needed for sleep. , Disp: , Rfl:   Past Medical History: Past Medical History:  Diagnosis Date  . Anemia    h/o  . Cancer ([place]  melanoma  . Coronary artery disease   . DDD (degenerative disc disease), lumbar   . Dysphagia   . Dyspnea    with exertion-unable to walk a mile without getting sob- dr sparks set pt up to see Dr GolRockey Situter 11-19-16 surgery  . Esophageal dysmotility   . Fibrocystic breast disease   . GERD (gastroesophageal reflux disease)   . Heart murmur    h/o   . Hyperlipidemia   . Hypertension   . Mitral regurgitation   . Osteopenia   . Pulmonary fibrosis ([facility]  per Dr FleRaul Del RA (rheumatoid arthritis) ([place] . Raynaud's disease   . Scleroderma ([place]  Tobacco Use: History  Smoking Status  . Never Smoker  Smokeless Tobacco  . Never Used    Labs: Recent Review Flowsheet Data    There is no flowsheet data to display.       ADL UCSD:     Pulmonary Assessment Scores    Row Name 06/23/17 1241         ADL UCSD   ADL Phase Entry     SOB Score total 82     Rest 2     Walk 3     Stairs 4     Bath 3     Dress 3     Shop 5       mMRC Score   mMRC Score 2        Pulmonary Function Assessment:     Pulmonary Function Assessment - 06/23/17 1240      Pulmonary Function Tests   FVC% 97 %   FEV1% 91 %   FEV1/FVC Ratio 74   RV% 93 %   DLCO% 54 %      Exercise Target Goals:    Exercise Program Goal: Individual exercise prescription set with THRR, safety & activity barriers. Participant demonstrates ability to understand and report RPE  using BORG scale, to self-measure pulse accurately, and to acknowledge the importance of the exercise prescription.  Exercise Prescription Goal: Starting with aerobic activity 30 plus minutes a day, 3 days per week for initial exercise prescription. Provide home exercise prescription and guidelines that participant acknowledges understanding prior to discharge.  Activity Barriers & Risk Stratification:   6 Minute Walk:     6 Minute Walk    Row Name 06/23/17 1152         6 Minute Walk   Phase Initial     Distance 845 feet     Walk Time 5 minutes     # of Rest Breaks 3     MPH 1.92     METS 2.02     RPE 15     Perceived Dyspnea  3     VO2 Peak 7.07     Symptoms Yes (comment)     Comments back pain 6/10 - has raynauds so SaO2 may not be accurate     Resting HR 94 bpm     Resting BP 134/66     Max Ex. HR 117 bpm     Max Ex. BP 164/76       Interval HR   Baseline HR 94     2 Minute HR 115     3 Minute HR 117     4 Minute HR 115     5 Minute HR 113     6 Minute HR 114     2 Minute Post HR 97     Interval Heart Rate? Yes       Interval Oxygen   Interval Oxygen? Yes     Baseline Oxygen Saturation % 98 %     Baseline Liters of Oxygen 0 L     1 Minute Liters of Oxygen 0 L     2 Minute Oxygen Saturation % 74 %   has Raynauds - Sa02 possibly inaccurate     2 Minute Liters of Oxygen 0 L     3 Minute Oxygen Saturation % 87 %     3 Minute Liters of Oxygen 0 L     4 Minute Oxygen Saturation % 80 %     4  Minute Liters of Oxygen 0 L     5 Minute Oxygen Saturation % 84 %     5 Minute Liters of Oxygen 0 L     6 Minute Oxygen Saturation % 92 %     6 Minute Liters of Oxygen 0 L     2 Minute Post Oxygen Saturation % 95 %     2 Minute Post Liters of Oxygen 0 L       Oxygen Initial Assessment:     Oxygen Initial Assessment - 06/23/17 1240      Home Oxygen   Home Oxygen Device None   Sleep Oxygen Prescription None   Home Exercise Oxygen Prescription None   Home at Rest  Exercise Oxygen Prescription None     Program Oxygen Prescription   Program Oxygen Prescription None     Intervention   Short Term Goals To learn and understand importance of monitoring SPO2 with pulse oximeter and demonstrate accurate use of the pulse oximeter.;To Learn and understand importance of maintaining oxygen saturations>88%;To learn and demonstrate proper purse lipped breathing techniques or other breathing techniques.;To learn and demonstrate proper use of respiratory medications   Long  Term Goals Maintenance of O2 saturations>88%;Compliance with respiratory medication;Demonstrates proper use of MDI's;Exhibits proper breathing techniques, such as purse lipped breathing or other method taught during program session;Verbalizes importance of monitoring SPO2 with pulse oximeter and return demonstration      Oxygen Re-Evaluation:     Oxygen Re-Evaluation    Row Name 06/29/17 1102             Goals/Expected Outcomes   Short Term Goals To learn and demonstrate proper purse lipped breathing techniques or other breathing techniques.       Comments Pursed lip breathing discussed and demonstrated with patient. Patient demonstrated understanding of this breathing technique and when to use it during exercise and ADL's.        Goals/Expected Outcomes Short: Patient will use PLB during exercise class to help control SOB. Long: Patient will independently encorporate PLB techniques thoughout daily life activities and exercise.           Oxygen Discharge (Final Oxygen Re-Evaluation):     Oxygen Re-Evaluation - 06/29/17 1102      Goals/Expected Outcomes   Short Term Goals To learn and demonstrate proper purse lipped breathing techniques or other breathing techniques.   Comments Pursed lip breathing discussed and demonstrated with patient. Patient demonstrated understanding of this breathing technique and when to use it during exercise and ADL's.    Goals/Expected Outcomes Short:  Patient will use PLB during exercise class to help control SOB. Long: Patient will independently encorporate PLB techniques thoughout daily life activities and exercise.       Initial Exercise Prescription:     Initial Exercise Prescription - 06/23/17 1100      Date of Initial Exercise RX and Referring Provider   Date 06/23/17   Referring Provider Simonds     Treadmill   MPH 1.3   Grade 0   Minutes 15   METs 2     NuStep   Level 1   SPM 80   Minutes 15   METs 2     REL-XR   Level 1   Speed 50   Minutes 15   METs 2     Biostep-RELP   Level 2   SPM 50   Minutes 15   METs 2     Prescription Details   Frequency (times  per week) 3   Duration Progress to 45 minutes of aerobic exercise without signs/symptoms of physical distress     Intensity   THRR 40-80% of Max Heartrate 115-137   Ratings of Perceived Exertion 11-15   Perceived Dyspnea 0-4     Resistance Training   Training Prescription Yes   Weight 2   Reps 10-15      Perform Capillary Blood Glucose checks as needed.  Exercise Prescription Changes:     Exercise Prescription Changes    Row Name 06/29/17 1100             Response to Exercise   Blood Pressure (Admit) 130/70       Blood Pressure (Exercise) 142/80       Blood Pressure (Exit) 124/62       Heart Rate (Admit) 94 bpm       Heart Rate (Exercise) 101 bpm       Heart Rate (Exit) 80 bpm       Oxygen Saturation (Admit) 98 %       Oxygen Saturation (Exercise) 92 %       Oxygen Saturation (Exit) 99 %       Rating of Perceived Exertion (Exercise) 15       Perceived Dyspnea (Exercise) 3         Resistance Training   Training Prescription Yes       Weight 2       Reps 10-15         Treadmill   MPH 1.3       Grade 0       Minutes 15       METs 2         NuStep   Level 1       SPM 80       Minutes 15       METs 2         REL-XR   Level 1       Speed 50       Minutes 15       METs 2          Exercise Comments:      Exercise Comments    Row Name 06/29/17 1059           Exercise Comments First full day of exercise!  Patient was oriented to gym and equipment including functions, settings, policies, and procedures.  Patient's individual exercise prescription and treatment plan were reviewed.  All starting workloads were established based on the results of the 6 minute walk test done at initial orientation visit.  The plan for exercise progression was also introduced and progression will be customized based on patient's performance and goals          Exercise Goals and Review:     Exercise Goals    Row Name 06/23/17 1151             Exercise Goals   Increase Physical Activity Yes       Intervention Provide advice, education, support and counseling about physical activity/exercise needs.;Develop an individualized exercise prescription for aerobic and resistive training based on initial evaluation findings, risk stratification, comorbidities and participant's personal goals.       Expected Outcomes Achievement of increased cardiorespiratory fitness and enhanced flexibility, muscular endurance and strength shown through measurements of functional capacity and personal statement of participant.       Increase Strength and Stamina Yes  Intervention Provide advice, education, support and counseling about physical activity/exercise needs.;Develop an individualized exercise prescription for aerobic and resistive training based on initial evaluation findings, risk stratification, comorbidities and participant's personal goals.       Expected Outcomes Achievement of increased cardiorespiratory fitness and enhanced flexibility, muscular endurance and strength shown through measurements of functional capacity and personal statement of participant.          Exercise Goals Re-Evaluation :   Discharge Exercise Prescription (Final Exercise Prescription Changes):     Exercise Prescription Changes -  06/29/17 1100      Response to Exercise   Blood Pressure (Admit) 130/70   Blood Pressure (Exercise) 142/80   Blood Pressure (Exit) 124/62   Heart Rate (Admit) 94 bpm   Heart Rate (Exercise) 101 bpm   Heart Rate (Exit) 80 bpm   Oxygen Saturation (Admit) 98 %   Oxygen Saturation (Exercise) 92 %   Oxygen Saturation (Exit) 99 %   Rating of Perceived Exertion (Exercise) 15   Perceived Dyspnea (Exercise) 3     Resistance Training   Training Prescription Yes   Weight 2   Reps 10-15     Treadmill   MPH 1.3   Grade 0   Minutes 15   METs 2     NuStep   Level 1   SPM 80   Minutes 15   METs 2     REL-XR   Level 1   Speed 50   Minutes 15   METs 2      Nutrition:  Target Goals: Understanding of nutrition guidelines, daily intake of sodium <1536m, cholesterol <208m calories 30% from fat and 7% or less from saturated fats, daily to have 5 or more servings of fruits and vegetables.  Biometrics:     Pre Biometrics - 06/23/17 1151      Pre Biometrics   Height 5' 3"  (1.6 m)   Weight 194 lb 1.6 oz (88 kg)   Waist Circumference 40.75 inches   Hip Circumference 47.75 inches   Waist to Hip Ratio 0.85 %   BMI (Calculated) 34.5       Nutrition Therapy Plan and Nutrition Goals:     Nutrition Therapy & Goals - 06/23/17 1243      Nutrition Therapy   RD appointment defered Yes     Intervention Plan   Intervention Prescribe, educate and counsel regarding individualized specific dietary modifications aiming towards targeted core components such as weight, hypertension, lipid management, diabetes, heart failure and other comorbidities.;Nutrition handout(s) given to patient.   Expected Outcomes Short Term Goal: Understand basic principles of dietary content, such as calories, fat, sodium, cholesterol and nutrients.;Long Term Goal: Adherence to prescribed nutrition plan.      Nutrition Discharge: Rate Your Plate Scores:   Nutrition Goals Re-Evaluation:   Nutrition Goals  Discharge (Final Nutrition Goals Re-Evaluation):   Psychosocial: Target Goals: Acknowledge presence or absence of significant depression and/or stress, maximize coping skills, provide positive support system. Participant is able to verbalize types and ability to use techniques and skills needed for reducing stress and depression.   Initial Review & Psychosocial Screening:     Initial Psych Review & Screening - 06/23/17 1244      Initial Review   Current issues with Current Depression;History of Depression;Current Stress Concerns   Source of Stress Concerns Family;Unable to perform yard/household activities   Comments One of her two sons died in the past, One of her other sons is Bipolar and she states  that he can be stressful. Patient lives alone and sometimes she thinks about the future and not being able to take care of herself     [place]? Yes   Strains Intra-family strains   Comments son is a good source of support but is Bipolar     Barriers   Psychosocial barriers to participate in program Psychosocial barriers identified (see note);The patient should benefit from training in stress management and relaxation.  See PHQ and QOL scores     Screening Interventions   Interventions Encouraged to exercise;To provide support and resources with identified psychosocial needs;Program counselor consult;Yes   Expected Outcomes Short Term goal: Utilizing psychosocial counselor, staff and physician to assist with identification of specific Stressors or current issues interfering with healing process. Setting desired goal for each stressor or current issue identified.;Long Term goal: The participant improves quality of Life and PHQ9 Scores as seen by post scores and/or verbalization of changes;Short Term goal: Identification and review with participant of any Quality of Life or Depression concerns found by scoring the questionnaire.;Long Term Goal: Stressors or current  issues are controlled or eliminated.      Quality of Life Scores:     Quality of Life - 06/23/17 1246      Quality of Life Scores   Health/Function Pre 10.78 %   Socioeconomic Pre 18.21 %   Psych/Spiritual Pre 18.29 %   Family Pre 6 %   GLOBAL Pre 13.52 %      PHQ-9: Recent Review Flowsheet Data    Depression screen [facility] 2/9 06/23/2017   Decreased Interest 1   Down, Depressed, Hopeless 1   PHQ - 2 Score 2   Altered sleeping 1   Tired, decreased energy 3   Change in appetite 3   Feeling bad or failure about yourself  1   Trouble concentrating 2   Moving slowly or fidgety/restless 1   Suicidal thoughts 1   PHQ-9 Score 14   Difficult doing work/chores Extremely dIfficult     Interpretation of Total Score  Total Score Depression Severity:  1-4 = Minimal depression, 5-9 = Mild depression, 10-14 = Moderate depression, 15-19 = Moderately severe depression, 20-27 = Severe depression   Psychosocial Evaluation and Intervention:     Psychosocial Evaluation - 07/01/17 1157      Psychosocial Evaluation & Interventions   Interventions Encouraged to exercise with the program and follow exercise prescription;Relaxation education;Stress management education   Comments Counselor met with Ms. Mchugh (Naiah) today for initial psychosocial evaluation.  She is a 71 year old who has pulmonary fibrosis and reports having fallen 10 weeks ago and broke several ribs.  Erin [name] has multiple health issues with Arthritis; Scleraderma and cardiac and pulmonary issues.  She also has difficulty sleeping at night which has been a chronic problem for her.  She takes medication which helps occasionally.  Her appetite has recently returned and is better now.  She admits to a history of depression and anxiety for approximately 20+ years subsequent to her son dying at the age of 74.  Erin [name] is on medication for this and states it is helpful at times.  She admits her mood is stable at this time; but she "feels  alone" often with her spouses' death 8 years ago and her two remaining sons having mental illness and not able to be support for her.  Erin [name] leans on her church family mostly  and a cousin as her support system.  She has goals to increase her stamina and strength; sleep better; and be able to take care of her self and her home better.  Counselor will monitor Erin [name] throughout the course of this program   Expected Outcomes Erin [name] will benefit from consistent exercise to achieve her stated goals; she will also benefit from the psychoeducational components of this program to increase her coping strategies and learn ways to relax and calm to hopefully sleep better.        Psychosocial Re-Evaluation:   Psychosocial Discharge (Final Psychosocial Re-Evaluation):   Education: Education Goals: Education classes will be provided on a weekly basis, covering required topics. Participant will state understanding/return demonstration of topics presented.  Learning Barriers/Preferences:     Learning Barriers/Preferences - 06/23/17 1240      Learning Barriers/Preferences   Learning Barriers None   Learning Preferences None      Education Topics: Initial Evaluation Education: - Verbal, written and demonstration of respiratory meds, RPE/PD scales, oximetry and breathing techniques. Instruction on use of nebulizers and MDIs: cleaning and proper use, rinsing mouth with steroid doses and importance of monitoring MDI activations.   Pulmonary Rehab from 07/03/2017 in [facility] Cardiac and Pulmonary Rehab  Date  06/23/17  Educator  [facility]  Instruction Review Code  2- meets goals/outcomes      General Nutrition Guidelines/Fats and Fiber: -Group instruction provided by verbal, written material, models and posters to present the general guidelines for heart healthy nutrition. Gives an explanation and review of dietary fats and fiber.   Controlling Sodium/Reading Food Labels: -Group verbal and written material  supporting the discussion of sodium use in heart healthy nutrition. Review and explanation with models, verbal and written materials for utilization of the food label.   Exercise Physiology & Risk Factors: - Group verbal and written instruction with models to review the exercise physiology of the cardiovascular system and associated critical values. Details cardiovascular disease risk factors and the goals associated with each risk factor.   Aerobic Exercise & Resistance Training: - Gives group verbal and written discussion on the health impact of inactivity. On the components of aerobic and resistive training programs and the benefits of this training and how to safely progress through these programs.   Pulmonary Rehab from 07/03/2017 in [facility] Cardiac and Pulmonary Rehab  Date  07/03/17  Educator  [facility]  Instruction Review Code  2- meets goals/outcomes      Flexibility, Balance, General Exercise Guidelines: - Provides group verbal and written instruction on the benefits of flexibility and balance training programs. Provides general exercise guidelines with specific guidelines to those with heart or lung disease. Demonstration and skill practice provided.   Stress Management: - Provides group verbal and written instruction about the health risks of elevated stress, cause of high stress, and healthy ways to reduce stress.   Depression: - Provides group verbal and written instruction on the correlation between heart/lung disease and depressed mood, treatment options, and the stigmas associated with seeking treatment.   Exercise & Equipment Safety: - Individual verbal instruction and demonstration of equipment use and safety with use of the equipment.   Pulmonary Rehab from 07/03/2017 in [facility] Cardiac and Pulmonary Rehab  Date  06/29/17  Educator  [facility]  Instruction Review Code  2- meets goals/outcomes      Infection Prevention: - Provides verbal and written material to individual with  discussion of infection control including proper hand washing and proper equipment cleaning during exercise session.   Pulmonary Rehab from 07/03/2017 in [facility]  Cardiac and Pulmonary Rehab  Date  06/29/17  Educator  [facility]  Instruction Review Code  2- meets goals/outcomes      Falls Prevention: - Provides verbal and written material to individual with discussion of falls prevention and safety.   Pulmonary Rehab from 07/03/2017 in [facility] Cardiac and Pulmonary Rehab  Date  06/29/17  Educator  [facility]  Instruction Review Code  2- meets goals/outcomes      Diabetes: - Individual verbal and written instruction to review signs/symptoms of diabetes, desired ranges of glucose level fasting, after meals and with exercise. Advice that pre and post exercise glucose checks will be done for 3 sessions at entry of program.   Chronic Lung Diseases: - Group verbal and written instruction to review new updates, new respiratory medications, new advancements in procedures and treatments. Provide informative websites and "800" numbers of self-education.   Lung Procedures: - Group verbal and written instruction to describe testing methods done to diagnose lung disease. Review the outcome of test results. Describe the treatment choices: Pulmonary Function Tests, ABGs and oximetry.   Energy Conservation: - Provide group verbal and written instruction for methods to conserve energy, plan and organize activities. Instruct on pacing techniques, use of adaptive equipment and posture/positioning to relieve shortness of breath.   Triggers: - Group verbal and written instruction to review types of environmental controls: home humidity, furnaces, filters, dust mite/pet prevention, HEPA vacuums. To discuss weather changes, air quality and the benefits of nasal washing.   Exacerbations: - Group verbal and written instruction to provide: warning signs, infection symptoms, calling MD promptly, preventive modes, and value of  vaccinations. Review: effective airway clearance, coughing and/or vibration techniques. Create an Sports administrator.   Oxygen: - Individual and group verbal and written instruction on oxygen therapy. Includes supplement oxygen, available portable oxygen systems, continuous and intermittent flow rates, oxygen safety, concentrators, and Medicare reimbursement for oxygen.   Pulmonary Rehab from 07/03/2017 in [facility] Cardiac and Pulmonary Rehab  Date  06/23/17  Educator  [facility]  Instruction Review Code  2- meets goals/outcomes      Respiratory Medications: - Group verbal and written instruction to review medications for lung disease. Drug class, frequency, complications, importance of spacers, rinsing mouth after steroid MDI's, and proper cleaning methods for nebulizers.   Pulmonary Rehab from 07/03/2017 in [facility] Cardiac and Pulmonary Rehab  Date  06/23/17  Educator  [facility]  Instruction Review Code  2- meets goals/outcomes      AED/CPR: - Group verbal and written instruction with the use of models to demonstrate the basic use of the AED with the basic ABC's of resuscitation.   Breathing Retraining: - Provides individuals verbal and written instruction on purpose, frequency, and proper technique of diaphragmatic breathing and pursed-lipped breathing. Applies individual practice skills.   Pulmonary Rehab from 07/03/2017 in [facility] Cardiac and Pulmonary Rehab  Date  06/23/17  Educator  [facility]  Instruction Review Code  2- meets goals/outcomes      Anatomy and Physiology of the Lungs: - Group verbal and written instruction with the use of models to provide basic lung anatomy and physiology related to function, structure and complications of lung disease.   Heart Failure: - Group verbal and written instruction on the basics of heart failure: signs/symptoms, treatments, explanation of ejection fraction, enlarged heart and cardiomyopathy.   Sleep Apnea: - Individual verbal and written instruction to review  Obstructive Sleep Apnea. Review of risk factors, methods for diagnosing and types of masks and machines for OSA.  Anxiety: - Provides group, verbal and written instruction on the correlation between heart/lung disease and anxiety, treatment options, and management of anxiety.   Relaxation: - Provides group, verbal and written instruction about the benefits of relaxation for patients with heart/lung disease. Also provides patients with examples of relaxation techniques.   Pulmonary Rehab from 07/03/2017 in [facility] Cardiac and Pulmonary Rehab  Date  07/01/17  Educator  [facility]  Instruction Review Code  2- Meets goals/outcomes      Knowledge Questionnaire Score:     Knowledge Questionnaire Score - 06/23/17 1240      Knowledge Questionnaire Score   Pre Score 8/10       Core Components/Risk Factors/Patient Goals at Admission:     Personal Goals and Risk Factors at Admission - 06/23/17 1238      Core Components/Risk Factors/Patient Goals on Admission    Weight Management Obesity;Yes;Weight Loss   Intervention Weight Management: Develop a combined nutrition and exercise program designed to reach desired caloric intake, while maintaining appropriate intake of nutrient and fiber, sodium and fats, and appropriate energy expenditure required for the weight goal.;Obesity: Provide education and appropriate resources to help participant work on and attain dietary goals.;Weight Management: Provide education and appropriate resources to help participant work on and attain dietary goals.;Weight Management/Obesity: Establish reasonable short term and long term weight goals.   Admit Weight 194 lb 1.6 oz (88 kg)   Goal Weight: Short Term 190 lb (86.2 kg)   Goal Weight: Long Term 175 lb (79.4 kg)   Expected Outcomes Understanding of distribution of calorie intake throughout the day with the consumption of 4-5 meals/snacks;Understanding recommendations for meals to include 15-35% energy as protein, 25-35%  energy from fat, 35-60% energy from carbohydrates, less than 232m of dietary cholesterol, 20-35 gm of total fiber daily;Weight Loss: Understanding of general recommendations for a balanced deficit meal plan, which promotes 1-2 lb weight loss per week and includes a negative energy balance of [phone] kcal/d;Weight Maintenance: Understanding of the daily nutrition guidelines, which includes 25-35% calories from fat, 7% or less cal from saturated fats, less than 2033mcholesterol, less than 1.5gm of sodium, & 5 or more servings of fruits and vegetables daily;Short Term: Continue to assess and modify interventions until short term weight is achieved;Long Term: Adherence to nutrition and physical activity/exercise program aimed toward attainment of established weight goal   Tobacco Cessation --  never a smoker   Improve shortness of breath with ADL's Yes   Intervention Provide education, individualized exercise plan and daily activity instruction to help decrease symptoms of SOB with activities of daily living.   Expected Outcomes Short Term: Achieves a reduction of symptoms when performing activities of daily living.   Develop more efficient breathing techniques such as purse lipped breathing and diaphragmatic breathing; and practicing self-pacing with activity Yes   Intervention Provide education, demonstration and support about specific breathing techniuqes utilized for more efficient breathing. Include techniques such as pursed lipped breathing, diaphragmatic breathing and self-pacing activity.   Expected Outcomes Short Term: Participant will be able to demonstrate and use breathing techniques as needed throughout daily activities.   Increase knowledge of respiratory medications and ability to use respiratory devices properly  Yes   Intervention Provide education and demonstration as needed of appropriate use of medications, inhalers, and oxygen therapy.   Expected Outcomes Short Term: Achieves  understanding of medications use. Understands that oxygen is a medication prescribed by physician. Demonstrates appropriate use of inhaler and oxygen therapy.  Core Components/Risk Factors/Patient Goals Review:    Core Components/Risk Factors/Patient Goals at Discharge (Final Review):    ITP Comments:     ITP Comments    Row Name 06/23/17 1316 07/06/17 0841         ITP Comments Medical Review completed. Documentation for diagnosis can be found in [facility] encounter date 06/03/17 30 day review completed ITP sent to Dr. Ramonita Lab for Dr. Emily Filbert Director of [place]. Continue with ITP unless changes are made by physician.          Comments:

## 2017-07-08 ENCOUNTER — Encounter: Payer: Medicare Other | Attending: Pulmonary Disease

## 2017-07-08 DIAGNOSIS — R0609 Other forms of dyspnea: Secondary | ICD-10-CM | POA: Insufficient documentation

## 2017-07-08 DIAGNOSIS — J8410 Pulmonary fibrosis, unspecified: Secondary | ICD-10-CM

## 2017-07-08 NOTE — Progress Notes (Signed)
Daily Session Note  Patient Details  Name: PANDA CROSSIN MRN: [id number] Date of Birth: 1945/12/28 Referring Provider:     Pulmonary Rehab from 06/23/2017 in [facility] Cardiac and Pulmonary Rehab  Referring Provider  [place]      Encounter Date: 07/08/2017  Check In:     Session Check In - 07/08/17 1012      Check-In   Location [facility]-Cardiac & Pulmonary Rehab   Staff Present Alberteen Sam, MA, ACSM RCEP, Exercise Physiologist;Amanda Oletta Darter, BA, ACSM CEP, Exercise Physiologist;Alianys Chacko Flavia Shipper   Supervising physician immediately available to respond to emergencies [facility] immediately available ER MD   Physician(s) Dr. Mable Paris and Jacqualine Code   Medication changes reported     No   Fall or balance concerns reported    No   Warm-up and Cool-down Performed as group-led instruction   Resistance Training Performed Yes   VAD Patient? No     Pain Assessment   Currently in Pain? No/denies   Multiple Pain Sites No         History  Smoking Status  . Never Smoker  Smokeless Tobacco  . Never Used    Goals Met:  Proper associated with RPD/PD & O2 Sat Independence with exercise equipment Exercise tolerated well Strength training completed today  Goals Unmet:  Not Applicable  Comments: Pt able to follow exercise prescription today without complaint.  Will continue to monitor for progression.   Dr. Emily Filbert is Medical Director for [place] and [facility].

## 2017-07-10 ENCOUNTER — Encounter: Payer: Medicare Other | Admitting: *Deleted

## 2017-07-10 DIAGNOSIS — J8410 Pulmonary fibrosis, unspecified: Secondary | ICD-10-CM

## 2017-07-10 DIAGNOSIS — R0609 Other forms of dyspnea: Secondary | ICD-10-CM | POA: Diagnosis not present

## 2017-07-10 NOTE — Progress Notes (Signed)
Daily Session Note  Patient Details  Name: Erin [name] MRN: [id number] Date of Birth: 1946-10-08 Referring Provider:     Pulmonary Rehab from 06/23/2017 in [facility] Cardiac and Pulmonary Rehab  Referring Provider  Simonds      Encounter Date: 07/10/2017  Check In:     Session Check In - 07/10/17 1010      Check-In   Location [facility]-Cardiac & Pulmonary Rehab   Staff Present Gerlene Burdock, RN, Vickki Hearing, BA, ACSM CEP, Exercise Physiologist   Supervising physician immediately available to respond to emergencies [facility] immediately available ER MD   Physician(s) Dr. Alfred Levins and Dr. Jimmye Norman   Fall or balance concerns reported    No   Tobacco Cessation No Change   Warm-up and Cool-down Performed as group-led instruction   Resistance Training Performed Yes   VAD Patient? No     Pain Assessment   Currently in Pain? No/denies           Exercise Prescription Changes - 07/09/17 1300      Response to Exercise   Blood Pressure (Admit) 124/70   Blood Pressure (Exercise) 146/72   Blood Pressure (Exit) 136/64   Heart Rate (Admit) 85 bpm   Heart Rate (Exercise) 119 bpm   Heart Rate (Exit) 85 bpm   Oxygen Saturation (Admit) 100 %   Oxygen Saturation (Exercise) 97 %   Oxygen Saturation (Exit) 98 %   Rating of Perceived Exertion (Exercise) 13   Perceived Dyspnea (Exercise) 3   Duration Continue with 45 min of aerobic exercise without signs/symptoms of physical distress.   Intensity THRR unchanged     Progression   Progression Continue to progress workloads to maintain intensity without signs/symptoms of physical distress.   Average METs 3.15     Resistance Training   Training Prescription Yes   Weight 2   Reps 10-15     Interval Training   Interval Training No     REL-XR   Level 2   Speed 56   Minutes 15   METs 4.2     Track   Laps 25   Minutes 15   METs 2.15      History  Smoking Status  . Never Smoker  Smokeless Tobacco  . Never Used     Goals Met:  Proper associated with RPD/PD & O2 Sat Exercise tolerated well Strength training completed today  Goals Unmet:  Not Applicable  Comments:     Dr. Emily Filbert is Medical Director for [place] and [facility].

## 2017-07-13 ENCOUNTER — Encounter: Payer: Medicare Other | Admitting: *Deleted

## 2017-07-13 DIAGNOSIS — R0609 Other forms of dyspnea: Secondary | ICD-10-CM | POA: Diagnosis not present

## 2017-07-13 DIAGNOSIS — J8410 Pulmonary fibrosis, unspecified: Secondary | ICD-10-CM

## 2017-07-13 NOTE — Progress Notes (Signed)
Daily Session Note  Patient Details  Name: Erin [name] MRN: [id number] Date of Birth: 11-12-1946 Referring Provider:     Pulmonary Rehab from 06/23/2017 in [facility] Cardiac and Pulmonary Rehab  Referring Provider  Simonds      Encounter Date: 07/13/2017  Check In:     Session Check In - 07/13/17 1011      Check-In   Location [facility]-Cardiac & Pulmonary Rehab   Staff Present Carson Myrtle, BS, RRT, Respiratory Bertis Ruddy, BS, ACSM CEP, Exercise Physiologist;Joseph Flavia Shipper   Supervising physician immediately available to respond to emergencies [facility] immediately available ER MD   Physician(s) Dr. Kerman Passey and Dr. Jimmye Norman    Medication changes reported     No   Fall or balance concerns reported    No   Warm-up and Cool-down Performed as group-led instruction   Resistance Training Performed Yes   VAD Patient? No     Pain Assessment   Currently in Pain? No/denies   Multiple Pain Sites No         History  Smoking Status  . Never Smoker  Smokeless Tobacco  . Never Used    Goals Met:  Proper associated with RPD/PD & O2 Sat Independence with exercise equipment Exercise tolerated well Strength training completed today  Goals Unmet:  Not Applicable  Comments: Pt able to follow exercise prescription today without complaint.  Will continue to monitor for progression.    Dr. Emily Filbert is Medical Director for [place] and [facility].

## 2017-07-15 ENCOUNTER — Encounter: Payer: Medicare Other | Admitting: *Deleted

## 2017-07-15 DIAGNOSIS — J8410 Pulmonary fibrosis, unspecified: Secondary | ICD-10-CM

## 2017-07-15 DIAGNOSIS — R0609 Other forms of dyspnea: Secondary | ICD-10-CM | POA: Diagnosis not present

## 2017-07-15 NOTE — Progress Notes (Signed)
Daily Session Note  Patient Details  Name: LILEIGH FAHRINGER MRN: [id number] Date of Birth: [birth year] Referring Provider:     Pulmonary Rehab from 06/23/2017 in [facility] Cardiac and Pulmonary Rehab  Referring Provider  Simonds      Encounter Date: 07/15/2017  Check In:     Session Check In - 07/15/17 1015      Check-In   Location [facility]-Cardiac & Pulmonary Rehab   Staff Present Heath Lark, RN, BSN, CCRP;Jessica [place], [place], ACSM RCEP, Exercise Physiologist;Joseph Flavia Shipper   Supervising physician immediately available to respond to emergencies [facility] immediately available ER MD   Physician(s) Drs: Gwyndolyn Saxon and Archie Balboa   Medication changes reported     No   Warm-up and Cool-down Performed as group-led instruction   Resistance Training Performed Yes   VAD Patient? No     Pain Assessment   Currently in Pain? No/denies         History  Smoking Status  . Never Smoker  Smokeless Tobacco  . Never Used    Goals Met:  Proper associated with RPD/PD & O2 Sat Exercise tolerated well Strength training completed today  Goals Unmet:  Not Applicable  Comments: Doing well with exercise prescription progression..   Dr. Emily Filbert is Medical Director for [place] and [facility].

## 2017-07-17 DIAGNOSIS — J8410 Pulmonary fibrosis, unspecified: Secondary | ICD-10-CM

## 2017-07-17 DIAGNOSIS — R0609 Other forms of dyspnea: Secondary | ICD-10-CM | POA: Diagnosis not present

## 2017-07-17 NOTE — Progress Notes (Signed)
Daily Session Note  Patient Details  Name: MEGHEN AKOPYAN MRN: [id number] Date of Birth: 1946/02/01 Referring Provider:     Pulmonary Rehab from 06/23/2017 in [facility] Cardiac and Pulmonary Rehab  Referring Provider  Simonds      Encounter Date: 07/17/2017  Check In:     Session Check In - 07/17/17 1023      Check-In   Location [facility]-Cardiac & Pulmonary Rehab   Staff Present Alberteen Sam, MA, ACSM RCEP, Exercise Physiologist;Milledge Gerding Flavia Shipper   Supervising physician immediately available to respond to emergencies [facility] immediately available ER MD   Physician(s) Dr. Jacqualine Code and Corky Downs   Medication changes reported     No   Fall or balance concerns reported    No   Warm-up and Cool-down Performed as group-led instruction   Resistance Training Performed Yes   VAD Patient? No     Pain Assessment   Currently in Pain? No/denies   Multiple Pain Sites No         History  Smoking Status  . Never Smoker  Smokeless Tobacco  . Never Used    Goals Met:  Proper associated with RPD/PD & O2 Sat Independence with exercise equipment Exercise tolerated well Personal goals reviewed No report of cardiac concerns or symptoms Strength training completed today  Goals Unmet:  Not Applicable  Comments: Pt able to follow exercise prescription today without complaint.  Will continue to monitor for progression.  Spent 16 min talking with [name] about goals and how to deal with her cousin's upcoming surgery.  Dr. Emily Filbert is Medical Director for [place] and [facility].

## 2017-07-20 ENCOUNTER — Encounter: Payer: Medicare Other | Admitting: *Deleted

## 2017-07-20 DIAGNOSIS — R0609 Other forms of dyspnea: Secondary | ICD-10-CM | POA: Diagnosis not present

## 2017-07-20 DIAGNOSIS — J8410 Pulmonary fibrosis, unspecified: Secondary | ICD-10-CM

## 2017-07-20 NOTE — Progress Notes (Signed)
Daily Session Note  Patient Details  Name: Erin [name] MRN: [id number] Date of Birth: Feb 14, 1946 Referring Provider:     Pulmonary Rehab from 06/23/2017 in [facility] Cardiac and Pulmonary Rehab  Referring Provider  Simonds      Encounter Date: 07/20/2017  Check In:     Session Check In - 07/20/17 1009      Check-In   Location [facility]-Cardiac & Pulmonary Rehab   Staff Present Justin Mend Jaci Carrel, BS, ACSM CEP, Exercise Physiologist;Amanda Oletta Darter, [place], ACSM CEP, Exercise Physiologist   Supervising physician immediately available to respond to emergencies [facility] immediately available ER MD   Physician(s) Dr. Mosie Lukes and Dr. Alfred Levins   Medication changes reported     No   Fall or balance concerns reported    No   Warm-up and Cool-down Performed as group-led instruction   Resistance Training Performed Yes   VAD Patient? No     Pain Assessment   Currently in Pain? No/denies   Multiple Pain Sites No         History  Smoking Status  . Never Smoker  Smokeless Tobacco  . Never Used    Goals Met:  Proper associated with RPD/PD & O2 Sat Independence with exercise equipment Exercise tolerated well Strength training completed today  Goals Unmet:  Not Applicable  Comments: Pt able to follow exercise prescription today without complaint.  Will continue to monitor for progression.    Dr. Emily Filbert is Medical Director for [place] and [facility].

## 2017-07-22 ENCOUNTER — Encounter: Payer: Medicare Other | Admitting: *Deleted

## 2017-07-22 DIAGNOSIS — J8410 Pulmonary fibrosis, unspecified: Secondary | ICD-10-CM

## 2017-07-22 DIAGNOSIS — R0609 Other forms of dyspnea: Secondary | ICD-10-CM | POA: Diagnosis not present

## 2017-07-22 NOTE — Progress Notes (Signed)
Daily Session Note  Patient Details  Name: VIANEY CANIGLIA MRN: [id number] Date of Birth: 1946-10-07 Referring Provider:     Pulmonary Rehab from 06/23/2017 in [facility] Cardiac and Pulmonary Rehab  Referring Provider  Simonds      Encounter Date: 07/22/2017  Check In:     Session Check In - 07/22/17 1008      Check-In   Location [facility]-Cardiac & Pulmonary Rehab   Staff Present Alberteen Sam, MA, ACSM RCEP, Exercise Physiologist;Amanda Oletta Darter, BA, ACSM CEP, Exercise Physiologist;Joseph Flavia Shipper   Supervising physician immediately available to respond to emergencies [facility] immediately available ER MD   Physician(s) Drs. Quentin Cornwall and [place]   Medication changes reported     No   Fall or balance concerns reported    No   Warm-up and Cool-down Performed as group-led Location manager Performed Yes   VAD Patient? No     Pain Assessment   Currently in Pain? No/denies   Multiple Pain Sites No         History  Smoking Status  . Never Smoker  Smokeless Tobacco  . Never Used    Goals Met:  Proper associated with RPD/PD & O2 Sat Independence with exercise equipment Exercise tolerated well No report of cardiac concerns or symptoms Strength training completed today  Goals Unmet:  Not Applicable  Comments: Pt able to follow exercise prescription today without complaint.  Will continue to monitor for progression.   Dr. Emily Filbert is Medical Director for [place] and [facility].

## 2017-07-24 ENCOUNTER — Encounter: Payer: Medicare Other | Admitting: *Deleted

## 2017-07-24 DIAGNOSIS — J8410 Pulmonary fibrosis, unspecified: Secondary | ICD-10-CM

## 2017-07-24 DIAGNOSIS — R0609 Other forms of dyspnea: Secondary | ICD-10-CM | POA: Diagnosis not present

## 2017-07-24 NOTE — Progress Notes (Signed)
Daily Session Note  Patient Details  Name: Erin [name] MRN: [id number] Date of Birth: 29-Aug-1946 Referring Provider:     Pulmonary Rehab from 06/23/2017 in [facility] Cardiac and Pulmonary Rehab  Referring Provider  Simonds      Encounter Date: 07/24/2017  Check In:     Session Check In - 07/24/17 1044      Check-In   Location [facility]-Cardiac & Pulmonary Rehab   Staff Present Gerlene Burdock, RN, Vickki Hearing, BA, ACSM CEP, Exercise Physiologist;Joseph Flavia Shipper   Supervising physician immediately available to respond to emergencies [facility] immediately available ER MD   Physician(s) DR. Rifenbark and DR. Kinner   Medication changes reported     No   Fall or balance concerns reported    No   Tobacco Cessation No Change   Warm-up and Cool-down Performed as group-led instruction   Resistance Training Performed Yes   VAD Patient? No     Pain Assessment   Currently in Pain? No/denies         History  Smoking Status  . Never Smoker  Smokeless Tobacco  . Never Used    Goals Met:  Proper associated with RPD/PD & O2 Sat Exercise tolerated well Strength training completed today  Goals Unmet:  Not Applicable  Comments:     Dr. Emily Filbert is Medical Director for [place] and [facility].

## 2017-07-27 ENCOUNTER — Telehealth: Payer: Self-pay

## 2017-07-27 NOTE — Telephone Encounter (Signed)
Erin [name] called this morning and stated her back is hurting and she has an appointment for an MRI. Her cousin is having back surgery Wednesday and Friday so she will not be here this week.

## 2017-07-28 ENCOUNTER — Other Ambulatory Visit: Payer: Self-pay | Admitting: Rheumatology

## 2017-07-28 DIAGNOSIS — M545 Low back pain: Secondary | ICD-10-CM

## 2017-07-29 ENCOUNTER — Ambulatory Visit: Payer: Medicare Other

## 2017-07-31 ENCOUNTER — Ambulatory Visit
Admission: RE | Admit: 2017-07-31 | Discharge: 2017-07-31 | Disposition: A | Discharge status: Home / Self Care | Payer: Medicare Other | Source: Ambulatory Visit | Attending: Rheumatology | Admitting: Rheumatology

## 2017-07-31 DIAGNOSIS — M5136 Other intervertebral disc degeneration, lumbar region: Secondary | ICD-10-CM | POA: Insufficient documentation

## 2017-07-31 DIAGNOSIS — M48061 Spinal stenosis, lumbar region without neurogenic claudication: Secondary | ICD-10-CM | POA: Insufficient documentation

## 2017-07-31 DIAGNOSIS — Z85820 Personal history of malignant melanoma of skin: Secondary | ICD-10-CM | POA: Insufficient documentation

## 2017-07-31 DIAGNOSIS — M5126 Other intervertebral disc displacement, lumbar region: Secondary | ICD-10-CM | POA: Insufficient documentation

## 2017-07-31 DIAGNOSIS — M2578 Osteophyte, vertebrae: Secondary | ICD-10-CM | POA: Insufficient documentation

## 2017-07-31 DIAGNOSIS — M545 Low back pain: Secondary | ICD-10-CM | POA: Insufficient documentation

## 2017-07-31 DIAGNOSIS — M419 Scoliosis, unspecified: Secondary | ICD-10-CM | POA: Insufficient documentation

## 2017-08-03 ENCOUNTER — Telehealth: Payer: Self-pay

## 2017-08-03 DIAGNOSIS — J8410 Pulmonary fibrosis, unspecified: Secondary | ICD-10-CM

## 2017-08-03 NOTE — Progress Notes (Signed)
Pulmonary Individual Treatment Plan  Patient Details  Name: LARAYA PESTKA MRN: [id number] Date of Birth: 1946/09/27 Referring Provider:     Pulmonary Rehab from 06/23/2017 in [facility] Cardiac and Pulmonary Rehab  Referring Provider  Simonds      Initial Encounter Date:    Pulmonary Rehab [facility] 06/23/2017 in [facility] Card[facility] and Pul[facility] Re[facility]  Date  06/23/17  Referring Provider  Simonds      Visit Diagnosis: Pulmonary fibrosis ([place])  Patient's Home Medications on Admission:  Current Outpatient Prescriptions:  .  ALPRAZolam (XANAX) 0.5 MG tablet, Take 0.48ms twice daily as needed for anxiety, Disp: , Rfl:  .  amLODipine (NORVASC) 5 MG tablet, 5 mg bid, Disp: , Rfl:  .  aspirin EC 81 MG tablet, Take 81 mg by mouth at bedtime. , Disp: , Rfl:  .  cetirizine (ZYRTEC) 10 MG tablet, Take 10 mg by mouth daily as needed for allergies., Disp: , Rfl:  .  Clobetasol Prop Emollient Base (CLOBETASOL PROPIONATE E) 0.05 % emollient cream, Apply 1 application topically 2 (two) times daily as needed (sores). , Disp: , Rfl:  .  dexlansoprazole (DEXILANT) 60 MG capsule, Take 60 mg by mouth at bedtime. , Disp: , Rfl:  .  ezetimibe (ZETIA) 10 MG tablet, Take 1 tablet (10 mg total) by mouth daily., Disp: 30 tablet, Rfl: 11 .  FLUoxetine (PROZAC) 20 MG capsule, 60 mg by mouth every morning, Disp: , Rfl:  .  folic acid (FOLVITE) 4384MCG tablet, Take 400 mcg by mouth daily. , Disp: , Rfl:  .  furosemide (LASIX) 20 MG tablet, Take 246m daily as needed for swelling, Disp: , Rfl:  .  gabapentin (NEURONTIN) 100 MG capsule, Take by mouth., Disp: , Rfl:  .  hydroxychloroquine (PLAQUENIL) 200 MG tablet, Take by mouth daily. , Disp: , Rfl:  .  latanoprost (XALATAN) 0.005 % ophthalmic solution, Place 1 drop into both eyes at bedtime. , Disp: , Rfl:  .  lidocaine (XYLOCAINE) 2 % jelly, Apply 1 application topically as needed. Apply to affected area 4 times daily as needed, Disp: 30 mL, Rfl: 0 .  Multiple Vitamin  (MULTI-VITAMINS) TABS, Take 1 tablet by mouth daily. , Disp: , Rfl:  .  Omega-3 Fatty Acids (FISH OIL PO), Take 1 capsule by mouth daily. , Disp: , Rfl:  .  predniSONE (DELTASONE) 5 MG tablet, Take 5 mg by mouth daily with breakfast., Disp: , Rfl:  .  simvastatin (ZOCOR) 80 MG tablet, Take 8041mat bedtime, Disp: , Rfl:  .  zolpidem (AMBIEN) 10 MG tablet, Take 10 mg by mouth at bedtime as needed for sleep. , Disp: , Rfl:   Past Medical History: Past Medical History:  Diagnosis Date  . Anemia    h/o  . Cancer ([place]  melanoma  . Coronary artery disease   . DDD (degenerative disc disease), lumbar   . Dysphagia   . Dyspnea    with exertion-unable to walk a mile without getting sob- dr sparks set pt up to see Dr GolRockey Situter 11-19-16 surgery  . Esophageal dysmotility   . Fibrocystic breast disease   . GERD (gastroesophageal reflux disease)   . Heart murmur    h/o   . Hyperlipidemia   . Hypertension   . Mitral regurgitation   . Osteopenia   . Pulmonary fibrosis ([place]  per Dr FleRaul Del RA (rheumatoid arthritis) ([place] . Raynaud's disease   . Scleroderma ([facility] [place]  Tobacco Use: History  Smoking Status  . Never Smoker  Smokeless Tobacco  . Never Used    Labs: Recent Review Flowsheet Data    There is no flowsheet data to display.       Pulmonary Assessment Scores:     Pulmonary Assessment Scores    Row Name 06/23/17 1241         ADL UCSD   ADL Phase Entry     SOB Score total 82     Rest 2     Walk 3     Stairs 4     Bath 3     Dress 3     Shop 5       mMRC Score   mMRC Score 2        Pulmonary Function Assessment:     Pulmonary Function Assessment - 06/23/17 1240      Pulmonary Function Tests   FVC% 97 %   FEV1% 91 %   FEV1/FVC Ratio 74   RV% 93 %   DLCO% 54 %      Exercise Target Goals:    Exercise Program Goal: Individual exercise prescription set with THRR, safety & activity barriers. Participant demonstrates ability to  understand and report RPE using BORG scale, to self-measure pulse accurately, and to acknowledge the importance of the exercise prescription.  Exercise Prescription Goal: Starting with aerobic activity 30 plus minutes a day, 3 days per week for initial exercise prescription. Provide home exercise prescription and guidelines that participant acknowledges understanding prior to discharge.  Activity Barriers & Risk Stratification:   6 Minute Walk:     6 Minute Walk    Row Name 06/23/17 1152         6 Minute Walk   Phase Initial     Distance 845 feet     Walk Time 5 minutes     # of Rest Breaks 3     MPH 1.92     METS 2.02     RPE 15     Perceived Dyspnea  3     VO2 Peak 7.07     Symptoms Yes (comment)     Comments back pain 6/10 - has raynauds so SaO2 may not be accurate     Resting HR 94 bpm     Resting BP 134/66     Max Ex. HR 117 bpm     Max Ex. BP 164/76       Interval HR   Baseline HR (retired) 94     2 Minute HR 115     3 Minute HR 117     4 Minute HR 115     5 Minute HR 113     6 Minute HR 114     2 Minute Post HR 97     Interval Heart Rate? Yes       Interval Oxygen   Interval Oxygen? Yes     Baseline Oxygen Saturation % 98 %     Resting Liters of Oxygen 0 L     1 Minute Liters of Oxygen 0 L     2 Minute Oxygen Saturation % 74 %   has Raynauds - Sa02 possibly inaccurate     2 Minute Liters of Oxygen 0 L     3 Minute Oxygen Saturation % 87 %     3 Minute Liters of Oxygen 0 L     4 Minute Oxygen Saturation % 80 %  4 Minute Liters of Oxygen 0 L     5 Minute Oxygen Saturation % 84 %     5 Minute Liters of Oxygen 0 L     6 Minute Oxygen Saturation % 92 %     6 Minute Liters of Oxygen 0 L     2 Minute Post Oxygen Saturation % 95 %     2 Minute Post Liters of Oxygen 0 L       Oxygen Initial Assessment:     Oxygen Initial Assessment - 06/23/17 1240      Home Oxygen   Home Oxygen Device None   Sleep Oxygen Prescription None   Home Exercise Oxygen  Prescription None   Home at Rest Exercise Oxygen Prescription None     Program Oxygen Prescription   Program Oxygen Prescription None     Intervention   Short Term Goals To learn and understand importance of monitoring SPO2 with pulse oximeter and demonstrate accurate use of the pulse oximeter.;To Learn and understand importance of maintaining oxygen saturations>88%;To learn and demonstrate proper purse lipped breathing techniques or other breathing techniques.;To learn and demonstrate proper use of respiratory medications   Long  Term Goals Maintenance of O2 saturations>88%;Compliance with respiratory medication;Demonstrates proper use of MDI's;Exhibits proper breathing techniques, such as purse lipped breathing or other method taught during program session;Verbalizes importance of monitoring SPO2 with pulse oximeter and return demonstration      Oxygen Re-Evaluation:     Oxygen Re-Evaluation    Row Name 06/29/17 1102 07/17/17 1039           Program Oxygen Prescription   Program Oxygen Prescription  - None        Home Oxygen   Home Oxygen Device  - None      Sleep Oxygen Prescription  - None      Home Exercise Oxygen Prescription  - None      Home at Rest Exercise Oxygen Prescription  - None        Goals/Expected Outcomes   Short Term Goals To learn and demonstrate proper purse lipped breathing techniques or other breathing techniques. To learn and understand importance of monitoring SPO2 with pulse oximeter and demonstrate accurate use of the pulse oximeter.;To Learn and understand importance of maintaining oxygen saturations>88%;To learn and demonstrate proper purse lipped breathing techniques or other breathing techniques.;To learn and demonstrate proper use of respiratory medications      Long  Term Goals  - Maintenance of O2 saturations>88%;Verbalizes importance of monitoring SPO2 with pulse oximeter and return demonstration;Exhibits proper breathing techniques, such as  purse lipped breathing or other method taught during program session;Compliance with respiratory medication      Comments Pursed lip breathing discussed and demonstrated with patient. Patient demonstrated understanding of this breathing technique and when to use it during exercise and ADL's.  Satomi has been able to maintain good saturations.   She is not able to use a pulse oximeter on her own as her hands stay too cold. We are using a forehead probe during classes.  She has been using the PLB regularly and no longer needs queueing for it.  She has also worked some on her diaphragmatic breathing  for relaxation.  She has been compliant with her medications and has no problem gettign them or using them.      Goals/Expected Outcomes Short: Patient will use PLB during exercise class to help control SOB. Long: Patient will independently encorporate PLB techniques thoughout daily life activities and  exercise.  Short: Continue to use PLB independently to improve SOB at home.  Long: Maintain good oxygen saturations and staying on top of her respiratory medications.         Oxygen Discharge (Final Oxygen Re-Evaluation):     Oxygen Re-Evaluation - 07/17/17 1039      Program Oxygen Prescription   Program Oxygen Prescription None     Home Oxygen   Home Oxygen Device None   Sleep Oxygen Prescription None   Home Exercise Oxygen Prescription None   Home at Rest Exercise Oxygen Prescription None     Goals/Expected Outcomes   Short Term Goals To learn and understand importance of monitoring SPO2 with pulse oximeter and demonstrate accurate use of the pulse oximeter.;To Learn and understand importance of maintaining oxygen saturations>88%;To learn and demonstrate proper purse lipped breathing techniques or other breathing techniques.;To learn and demonstrate proper use of respiratory medications   Long  Term Goals Maintenance of O2 saturations>88%;Verbalizes importance of monitoring SPO2 with pulse oximeter  and return demonstration;Exhibits proper breathing techniques, such as purse lipped breathing or other method taught during program session;Compliance with respiratory medication   Comments Salah has been able to maintain good saturations.   She is not able to use a pulse oximeter on her own as her hands stay too cold. We are using a forehead probe during classes.  She has been using the PLB regularly and no longer needs queueing for it.  She has also worked some on her diaphragmatic breathing  for relaxation.  She has been compliant with her medications and has no problem gettign them or using them.   Goals/Expected Outcomes Short: Continue to use PLB independently to improve SOB at home.  Long: Maintain good oxygen saturations and staying on top of her respiratory medications.      Initial Exercise Prescription:     Initial Exercise Prescription - 06/23/17 1100      Date of Initial Exercise RX and Referring Provider   Date 06/23/17   Referring Provider Simonds     Treadmill   MPH 1.3   Grade 0   Minutes 15   METs 2     NuStep   Level 1   SPM 80   Minutes 15   METs 2     REL-XR   Level 1   Speed 50   Minutes 15   METs 2     Biostep-RELP   Level 2   SPM 50   Minutes 15   METs 2     Prescription Details   Frequency (times per week) 3   Duration Progress to 45 minutes of aerobic exercise without signs/symptoms of physical distress     Intensity   THRR 40-80% of Max Heartrate 115-137   Ratings of Perceived Exertion 11-15   Perceived Dyspnea 0-4     Resistance Training   Training Prescription Yes   Weight 2   Reps 10-15      Perform Capillary Blood Glucose checks as needed.  Exercise Prescription Changes:     Exercise Prescription Changes    Row Name 06/29/17 1100 07/08/17 1000 07/09/17 1300 07/22/17 1100       Response to Exercise   Blood Pressure (Admit) 130/70  - 124/70 124/64    Blood Pressure (Exercise) 142/80  - 146/72  -    Blood Pressure (Exit)  124/62  - 136/64 126/66    Heart Rate (Admit) 94 bpm  - 85 bpm 85 bpm    Heart  Rate (Exercise) 101 bpm  - 119 bpm 89 bpm    Heart Rate (Exit) 80 bpm  - 85 bpm 70 bpm    Oxygen Saturation (Admit) 98 %  - 100 % 100 %    Oxygen Saturation (Exercise) 92 %  - 97 % 99 %    Oxygen Saturation (Exit) 99 %  - 98 % 99 %    Rating of Perceived Exertion (Exercise) 15  - 13 13    Perceived Dyspnea (Exercise) 3  - 3 2    Duration  -  - Continue with 45 min of aerobic exercise without signs/symptoms of physical distress. Continue with 45 min of aerobic exercise without signs/symptoms of physical distress.    Intensity  -  - THRR unchanged THRR unchanged      Progression   Progression  -  - Continue to progress workloads to maintain intensity without signs/symptoms of physical distress. Continue to progress workloads to maintain intensity without signs/symptoms of physical distress.    Average METs  -  - 3.15 3      Resistance Training   Training Prescription Yes  - Yes Yes    Weight 2  - 2 2    Reps 10-15  - 10-15 10-15      Interval Training   Interval Training  -  - No No      Treadmill   MPH 1.3  -  -  -    Grade 0  -  -  -    Minutes 15  -  -  -    METs 2  -  -  -      NuStep   Level 1  -  - 4    SPM 80  -  - 76    Minutes 15  -  - 15    METs 2  -  - 2.4      REL-XR   Level 1  - 2 4    Speed 50  - 56 49    Minutes 15  - 15 15    METs 2  - 4.2 3.6      Track   Laps  -  - 25  -    Minutes  -  - 15  -    METs  -  - 2.15  -      Home Exercise Plan   Plans to continue exercise at  - [name]  -  -    Frequency  - Add 1 additional day to program exercise sessions.  -  -    Initial Home Exercises Provided  - 07/08/17  -  -       Exercise Comments:     Exercise Comments    Row Name 06/29/17 1059 07/08/17 1104 07/24/17 1055       Exercise Comments First full day of exercise!  Patient was oriented to gym and equipment including functions, settings, policies, and procedures.   Patient's individual exercise prescription and treatment plan were reviewed.  All starting workloads were established based on the results of the 6 minute walk test done at initial orientation visit.  The plan for exercise progression was also introduced and progression will be customized based on patient's performance and goals Breslin is considering joining [employer] when she completes LW.  She will add some strength and stretching at home.  Her hands bother her due to RA so she plans to try wearing gloves.  Shivali reports her doctor said for her not to do the treadmill any more during Pulmonary Rehab. [place] reports that she feels much better walking around our track in our gym instead of walking on the treadmill since she can adjust her walking easier.         Exercise Goals and Review:     Exercise Goals    Row Name 06/23/17 1151             Exercise Goals   Increase Physical Activity Yes       Intervention Provide advice, education, support and counseling about physical activity/exercise needs.;Develop an individualized exercise prescription for aerobic and resistive training based on initial evaluation findings, risk stratification, comorbidities and participant's personal goals.       Expected Outcomes Achievement of increased cardiorespiratory fitness and enhanced flexibility, muscular endurance and strength shown through measurements of functional capacity and personal statement of participant.       Increase Strength and Stamina Yes       Intervention Provide advice, education, support and counseling about physical activity/exercise needs.;Develop an individualized exercise prescription for aerobic and resistive training based on initial evaluation findings, risk stratification, comorbidities and participant's personal goals.       Expected Outcomes Achievement of increased cardiorespiratory fitness and enhanced flexibility, muscular endurance and strength shown through measurements of  functional capacity and personal statement of participant.          Exercise Goals Re-Evaluation :     Exercise Goals Re-Evaluation    Row Name 07/08/17 1058 07/22/17 1201           Exercise Goal Re-Evaluation   Exercise Goals Review Increase Physical Activity;Increase Strenth and Stamina Increase Physical Activity;Increase Strenth and Stamina      Comments Tiari is considering joining [employer] when she completes LW.  She will add some strength and stretching at home.  Her hands bother her due to RA so she plans to try wearing gloves. Cleon has added walking at the mall with friends on Sunday to her exercise routine.  She has had some back pain.  I recommended she do exercises shes done in PT and reviewed pelvic tilts and knee to chest with her.  Her hands feel better since she is wearing gloves.      Expected Outcomes Short  - Malaiya will begin to exercise on her own at home and continue to attend regularly.  Long - Trenesha will join FF and maintain her exercise program.  Short - Nyajah will continue to walk on days not at [facility].  Long - Oveta will continue regular exercise.         Discharge Exercise Prescription (Final Exercise Prescription Changes):     Exercise Prescription Changes - 07/22/17 1100      Response to Exercise   Blood Pressure (Admit) 124/64   Blood Pressure (Exit) 126/66   Heart Rate (Admit) 85 bpm   Heart Rate (Exercise) 89 bpm   Heart Rate (Exit) 70 bpm   Oxygen Saturation (Admit) 100 %   Oxygen Saturation (Exercise) 99 %   Oxygen Saturation (Exit) 99 %   Rating of Perceived Exertion (Exercise) 13   Perceived Dyspnea (Exercise) 2   Duration Continue with 45 min of aerobic exercise without signs/symptoms of physical distress.   Intensity THRR unchanged     Progression   Progression Continue to progress workloads to maintain intensity without signs/symptoms of physical distress.   Average METs 3     Resistance  Training   Training Prescription Yes   Weight 2    Reps 10-15     Interval Training   Interval Training No     NuStep   Level 4   SPM 76   Minutes 15   METs 2.4     REL-XR   Level 4   Speed 49   Minutes 15   METs 3.6      Nutrition:  Target Goals: Understanding of nutrition guidelines, daily intake of sodium <1524m, cholesterol <2044m calories 30% from fat and 7% or less from saturated fats, daily to have 5 or more servings of fruits and vegetables.  Biometrics:     Pre Biometrics - 06/23/17 1151      Pre Biometrics   Height 5' 3"  (1.6 m)   Weight 194 lb 1.6 oz (88 kg)   Waist Circumference 40.75 inches   Hip Circumference 47.75 inches   Waist to Hip Ratio 0.85 %   BMI (Calculated) 34.5       Nutrition Therapy Plan and Nutrition Goals:     Nutrition Therapy & Goals - 07/17/17 1044      Nutrition Therapy   RD appointment defered Yes      Nutrition Discharge: Rate Your Plate Scores:   Nutrition Goals Re-Evaluation:     Nutrition Goals Re-Evaluation    Row Name 07/17/17 10[place] like to meet with the dietician but she would like to defer until after her cousin's surgery.       Expected Outcome Short: Get through cousin's surgery and staying diet concious.  Long: Meet with dietician to work on diet.          Nutrition Goals Discharge (Final Nutrition Goals Re-Evaluation):     Nutrition Goals Re-Evaluation - 07/17/17 10Chaseould like to meet with the dietician but she would like to defer until after her cousin's surgery.   Expected Outcome Short: Get through cousin's surgery and staying diet concious.  Long: Meet with dietician to work on diet.      Psychosocial: Target Goals: Acknowledge presence or absence of significant depression and/or stress, maximize coping skills, provide positive support system. Participant is able to verbalize types and ability to use techniques and skills needed for reducing stress and depression.    Initial Review & Psychosocial Screening:     Initial Psych Review & Screening - 06/23/17 1244      Initial Review   Current issues with Current Depression;History of Depression;Current Stress Concerns   Source of Stress Concerns Family;Unable to perform yard/household activities   Comments One of her two sons died in the past, One of her other sons is Bipolar and she states that he can be stressful. Patient lives alone and sometimes she thinks about the future and not being able to take care of herself     [place]   Strains Intra-family strains   Comments son is a good source of support but is Bipolar     Barriers   Psychosocial barriers to participate in program Psychosocial barriers identified (see note);The patient should benefit from training in stress management and relaxation.  See PHQ and QOL scores     Screening Interventions   Interventions Encouraged to exercise;To provide support and resources with identified psychosocial needs;Program counselor consult;Yes   Expected Outcomes  Short Term goal: Utilizing psychosocial counselor, staff and physician to assist with identification of specific Stressors or current issues interfering with healing process. Setting desired goal for each stressor or current issue identified.;Long Term goal: The participant improves quality of Life and PHQ9 Scores as seen by post scores and/or verbalization of changes;Short Term goal: Identification and review with participant of any Quality of Life or Depression concerns found by scoring the questionnaire.;Long Term Goal: Stressors or current issues are controlled or eliminated.      Quality of Life Scores:     Quality of Life - 06/23/17 1246      Quality of Life Scores   Health/Function Pre 10.78 %   Socioeconomic Pre 18.21 %   Psych/Spiritual Pre 18.29 %   Family Pre 6 %   GLOBAL Pre 13.52 %      PHQ-9: Recent Review Flowsheet Data    Depression  screen [facility] 2/9 06/23/2017   Decreased Interest 1   Down, Depressed, Hopeless 1   PHQ - 2 Score 2   Altered sleeping 1   Tired, decreased energy 3   Change in appetite 3   Feeling bad or failure about yourself  1   Trouble concentrating 2   Moving slowly or fidgety/restless 1   Suicidal thoughts 1   PHQ-9 Score 14   Difficult doing work/chores Extremely dIfficult     Interpretation of Total Score  Total Score Depression Severity:  1-4 = Minimal depression, 5-9 = Mild depression, 10-14 = Moderate depression, 15-19 = Moderately severe depression, 20-27 = Severe depression   Psychosocial Evaluation and Intervention:     Psychosocial Evaluation - 07/01/17 1157      Psychosocial Evaluation & Interventions   Interventions Encouraged to exercise with the program and follow exercise prescription;Relaxation education;Stress management education   Comments Counselor met with Ms. Winther (Carmela) today for initial psychosocial evaluation.  She is a 71 year old who has pulmonary fibrosis and reports having fallen 10 weeks ago and broke several ribs.  Eloni has multiple health issues with Arthritis; Scleraderma and cardiac and pulmonary issues.  She also has difficulty sleeping at night which has been a chronic problem for her.  She takes medication which helps occasionally.  Her appetite has recently returned and is better now.  She admits to a history of depression and anxiety for approximately 20+ years subsequent to her son dying at the age of 73.  Lafawn is on medication for this and states it is helpful at times.  She admits her mood is stable at this time; but she "feels alone" often with her spouses' death 8 years ago and her two remaining sons having mental illness and not able to be support for her.  Tykia leans on her church family mostly  and a cousin as her support system.  She has goals to increase her stamina and strength; sleep better; and be able to take care of her self and her home better.   Counselor will monitor Verble throughout the course of this program   Expected Outcomes Jaliana will benefit from consistent exercise to achieve her stated goals; she will also benefit from the psychoeducational components of this program to increase her coping strategies and learn ways to relax and calm to hopefully sleep better.        Psychosocial Re-Evaluation:     Psychosocial Re-Evaluation    [place] Name 07/06/17 1100 07/15/17 1117 07/17/17 1046         Psychosocial Re-Evaluation  Current issues with History of Depression  - History of Depression;Current Stress Concerns     Comments Counselor Follow up with [name] today reporting sleeping better since started program and is reading a book at night to help relax.  She also reports she is exercising better with the use of a walker starting today - which decreases her back pain and helps her exercise longer.  Aleza continues to have a stable mood and reports she copes with all the stress in her life with her health and family issues by relying on her faith and strong support system of friends.  Counselor commended [place] on her commitment to exercise and attend consistently.  Counselor follow with Lou[place] today stating she is breathing better finally now since the fall 10 weeks ago wherein she cracked some ribs.  She reports sleeping well and coping well currently.  Her mood is stable at this time.  Counselor commended [place] on her hard work and progress made.   Sharonne has been sleeping pretty good.  She reads at night to help calm herself to sleep. We talked about trying out new authors.  She is going to look into [employer].  Her stress levels remain moderate at best.  She is about to lose her primary support system as she will become her cousin's caregiver post surgery.  Usually they rely on each other to pull the other up.  However, with her cousin's upcoming surgery she is concerned about who is going to help lift her up while she is down. I  told her to use [place] to help her if needed.     Expected Outcomes Serita will continue to exercise and learn ways to relax and cope with her life stressors.    - Short: Lean on [place] during her cousin's surgery and find ways to cope.  Long: Continue to work on coping with stressors.     Interventions Relaxation education  - Relaxation education;Stress management education;Encouraged to attend Pulmonary Rehabilitation for the exercise     Continue Psychosocial Services   -  - Follow up required by staff     Comments  -  - Cousin undergoing surgery and still cannot rely on her sons for support.        Initial Review   Source of Stress Concerns  -  - Family;Chronic Illness        Psychosocial Discharge (Final Psychosocial Re-Evaluation):     Psychosocial Re-Evaluation - 07/17/17 1046      Psychosocial Re-Evaluation   Current issues with History of Depression;Current Stress Concerns   Comments [place] has been sleeping pretty good.  She reads at night to help calm herself to sleep. We talked about trying out new authors.  She is going to look into [employer].  Her stress levels remain moderate at best.  She is about to lose her primary support system as she will become her cousin's caregiver post surgery.  Usually they rely on each other to pull the other up.  However, with her cousin's upcoming surgery she is concerned about who is going to help lift her up while she is down. I told her to use [place] to help her if needed.   Expected Outcomes Short: Lean on [place] during her cousin's surgery and find ways to cope.  Long: Continue to work on coping with stressors.   Interventions Relaxation education;Stress management education;Encouraged to attend Pulmonary Rehabilitation for the exercise   Continue Psychosocial Services  Follow up  required by staff   Comments Cousin undergoing surgery and still cannot rely on her sons for support.      Initial Review   Source of Stress Concerns Family;Chronic Illness       Education: Education Goals: Education classes will be provided on a weekly basis, covering required topics. Participant will state understanding/return demonstration of topics presented.  Learning Barriers/Preferences:     Learning Barriers/Preferences - 06/23/17 1240      Learning Barriers/Preferences   Learning Barriers None   Learning Preferences None      Education Topics: Initial Evaluation Education: - Verbal, written and demonstration of respiratory meds, RPE/PD scales, oximetry and breathing techniques. Instruction on use of nebulizers and MDIs: cleaning and proper use, rinsing mouth with steroid doses and importance of monitoring MDI activations.   Pulmonary Rehab from 07/22/2017 in [facility] Cardiac and Pulmonary Rehab  Date  06/23/17  Educator  [facility]  Instruction Review Code (retired)  2- meets goals/outcomes      General Nutrition Guidelines/Fats and Fiber: -Group instruction provided by verbal, written material, models and posters to present the general guidelines for heart healthy nutrition. Gives an explanation and review of dietary fats and fiber.   Controlling Sodium/Reading Food Labels: -Group verbal and written material supporting the discussion of sodium use in heart healthy nutrition. Review and explanation with models, verbal and written materials for utilization of the food label.   Exercise Physiology & Risk Factors: - Group verbal and written instruction with models to review the exercise physiology of the cardiovascular system and associated critical values. Details cardiovascular disease risk factors and the goals associated with each risk factor.   Aerobic Exercise & Resistance Training: - Gives group verbal and written discussion on the health impact of inactivity. On the components of aerobic and resistive training programs and the benefits of this training and how to safely progress through these programs.   Pulmonary Rehab from 07/22/2017 in [facility]  Cardiac and Pulmonary Rehab  Date  07/03/17  Educator  [facility]  Instruction Review Code (retired)  2- Statistician, Balance, General Exercise Guidelines: - Provides group verbal and written instruction on the benefits of flexibility and balance training programs. Provides general exercise guidelines with specific guidelines to those with heart or lung disease. Demonstration and skill practice provided.   Pulmonary Rehab from 07/22/2017 in [facility] Cardiac and Pulmonary Rehab  Date  07/22/17  Educator  [facility]  Instruction Review Code (retired)  2- meets goals/outcomes      Stress Management: - Provides group verbal and written instruction about the health risks of elevated stress, cause of high stress, and healthy ways to reduce stress.   Depression: - Provides group verbal and written instruction on the correlation between heart/lung disease and depressed mood, treatment options, and the stigmas associated with seeking treatment.   Exercise & Equipment Safety: - Individual verbal instruction and demonstration of equipment use and safety with use of the equipment.   Pulmonary Rehab from 07/22/2017 in [facility] Cardiac and Pulmonary Rehab  Date  06/29/17  Educator  [facility]  Instruction Review Code (retired)  2- meets goals/outcomes      Infection Prevention: - Provides verbal and written material to individual with discussion of infection control including proper hand washing and proper equipment cleaning during exercise session.   Pulmonary Rehab from 07/22/2017 in [facility] Cardiac and Pulmonary Rehab  Date  06/29/17  Educator  [facility]  Instruction Review Code (retired)  2- meets goals/outcomes  Falls Prevention: - Provides verbal and written material to individual with discussion of falls prevention and safety.   Pulmonary Rehab from 07/22/2017 in [facility] Cardiac and Pulmonary Rehab  Date  06/29/17  Educator  [facility]  Instruction Review Code (retired)  2- meets goals/outcomes       Diabetes: - Individual verbal and written instruction to review signs/symptoms of diabetes, desired ranges of glucose level fasting, after meals and with exercise. Advice that pre and post exercise glucose checks will be done for 3 sessions at entry of program.   Chronic Lung Diseases: - Group verbal and written instruction to review new updates, new respiratory medications, new advancements in procedures and treatments. Provide informative websites and "800" numbers of self-education.   Lung Procedures: - Group verbal and written instruction to describe testing methods done to diagnose lung disease. Review the outcome of test results. Describe the treatment choices: Pulmonary Function Tests, ABGs and oximetry.   Energy Conservation: - Provide group verbal and written instruction for methods to conserve energy, plan and organize activities. Instruct on pacing techniques, use of adaptive equipment and posture/positioning to relieve shortness of breath.   Triggers: - Group verbal and written instruction to review types of environmental controls: home humidity, furnaces, filters, dust mite/pet prevention, HEPA vacuums. To discuss weather changes, air quality and the benefits of nasal washing.   Pulmonary Rehab from 07/22/2017 in [facility] Cardiac and Pulmonary Rehab  Date  07/08/17  Educator  [facility]  Instruction Review Code (retired)  2- meets goals/outcomes      Exacerbations: - Group verbal and written instruction to provide: warning signs, infection symptoms, calling MD promptly, preventive modes, and value of vaccinations. Review: effective airway clearance, coughing and/or vibration techniques. Create an Sports administrator.   Pulmonary Rehab from 07/22/2017 in [facility] Cardiac and Pulmonary Rehab  Date  07/08/17  Educator  [facility]  Instruction Review Code (retired)  2- meets goals/outcomes      Oxygen: - Individual and group verbal and written instruction on oxygen therapy. Includes supplement  oxygen, available portable oxygen systems, continuous and intermittent flow rates, oxygen safety, concentrators, and Medicare reimbursement for oxygen.   Pulmonary Rehab from 07/22/2017 in [facility] Cardiac and Pulmonary Rehab  Date  06/23/17  Educator  [facility]  Instruction Review Code (retired)  2- meets goals/outcomes      Respiratory Medications: - Group verbal and written instruction to review medications for lung disease. Drug class, frequency, complications, importance of spacers, rinsing mouth after steroid MDI's, and proper cleaning methods for nebulizers.   Pulmonary Rehab from 07/22/2017 in [facility] Cardiac and Pulmonary Rehab  Date  06/23/17  Educator  [facility]  Instruction Review Code (retired)  2- meets goals/outcomes      AED/CPR: - Group verbal and written instruction with the use of models to demonstrate the basic use of the AED with the basic ABC's of resuscitation.   Breathing Retraining: - Provides individuals verbal and written instruction on purpose, frequency, and proper technique of diaphragmatic breathing and pursed-lipped breathing. Applies individual practice skills.   Pulmonary Rehab from 07/22/2017 in [facility] Cardiac and Pulmonary Rehab  Date  06/23/17  Educator  [facility]  Instruction Review Code (retired)  2- Lawyer and Physiology of the Lungs: - Group verbal and written instruction with the use of models to provide basic lung anatomy and physiology related to function, structure and complications of lung disease.   Anatomy & Physiology of the Heart: - Group verbal and written instruction  and models provide basic cardiac anatomy and physiology, with the coronary electrical and arterial systems. Review of: AMI, Angina, Valve disease, Heart Failure, Cardiac Arrhythmia, Pacemakers, and the ICD.   Pulmonary Rehab from 07/22/2017 in [facility] Cardiac and Pulmonary Rehab  Date  07/17/17  Educator  [facility]  Instruction Review Code (retired)  2- meets goals/outcomes       Heart Failure: - Group verbal and written instruction on the basics of heart failure: signs/symptoms, treatments, explanation of ejection fraction, enlarged heart and cardiomyopathy.   Pulmonary Rehab from 07/22/2017 in [facility] Cardiac and Pulmonary Rehab  Date  07/17/17  Educator  [facility]  Instruction Review Code (retired)  2- meets goals/outcomes      Sleep Apnea: - Individual verbal and written instruction to review Obstructive Sleep Apnea. Review of risk factors, methods for diagnosing and types of masks and machines for OSA.   Anxiety: - Provides group, verbal and written instruction on the correlation between heart/lung disease and anxiety, treatment options, and management of anxiety.   Relaxation: - Provides group, verbal and written instruction about the benefits of relaxation for patients with heart/lung disease. Also provides patients with examples of relaxation techniques.   Pulmonary Rehab from 07/22/2017 in [facility] Cardiac and Pulmonary Rehab  Date  07/01/17  Educator  [facility]  Instruction Review Code (retired)  2- Meets goals/outcomes      Cardiac Medications: - Group verbal and written instruction to review commonly prescribed medications for heart disease. Reviews the medication, class of the drug, and side effects.   Know Your Numbers: -Group verbal and written instruction about important numbers in your health.  Review of Cholesterol, Blood Pressure, Diabetes, and BMI and the role they play in your overall health.   Other: -Provides group and verbal instruction on various topics (see comments)    Knowledge Questionnaire Score:     Knowledge Questionnaire Score - 06/23/17 1240      Knowledge Questionnaire Score   Pre Score 8/10       Core Components/Risk Factors/Patient Goals at Admission:     Personal Goals and Risk Factors at Admission - 06/23/17 1238      Core Components/Risk Factors/Patient Goals on Admission    Weight Management Obesity;Yes;Weight  Loss   Intervention Weight Management: Develop a combined nutrition and exercise program designed to reach desired caloric intake, while maintaining appropriate intake of nutrient and fiber, sodium and fats, and appropriate energy expenditure required for the weight goal.;Obesity: Provide education and appropriate resources to help participant work on and attain dietary goals.;Weight Management: Provide education and appropriate resources to help participant work on and attain dietary goals.;Weight Management/Obesity: Establish reasonable short term and long term weight goals.   Admit Weight 194 lb 1.6 oz (88 kg)   Goal Weight: Short Term 190 lb (86.2 kg)   Goal Weight: Long Term 175 lb (79.4 kg)   Expected Outcomes Understanding of distribution of calorie intake throughout the day with the consumption of 4-5 meals/snacks;Understanding recommendations for meals to include 15-35% energy as protein, 25-35% energy from fat, 35-60% energy from carbohydrates, less than 274m of dietary cholesterol, 20-35 gm of total fiber daily;Weight Loss: Understanding of general recommendations for a balanced deficit meal plan, which promotes 1-2 lb weight loss per week and includes a negative energy balance of [phone] kcal/d;Weight Maintenance: Understanding of the daily nutrition guidelines, which includes 25-35% calories from fat, 7% or less cal from saturated fats, less than 2054mcholesterol, less than 1.5gm of sodium, & 5 or more servings  of fruits and vegetables daily;Short Term: Continue to assess and modify interventions until short term weight is achieved;Long Term: Adherence to nutrition and physical activity/exercise program aimed toward attainment of established weight goal   Tobacco Cessation --  never a smoker   Improve shortness of breath with ADL's Yes   Intervention Provide education, individualized exercise plan and daily activity instruction to help decrease symptoms of SOB with activities of daily  living.   Expected Outcomes Short Term: Achieves a reduction of symptoms when performing activities of daily living.   Develop more efficient breathing techniques such as purse lipped breathing and diaphragmatic breathing; and practicing self-pacing with activity Yes   Intervention Provide education, demonstration and support about specific breathing techniuqes utilized for more efficient breathing. Include techniques such as pursed lipped breathing, diaphragmatic breathing and self-pacing activity.   Expected Outcomes Short Term: Participant will be able to demonstrate and use breathing techniques as needed throughout daily activities.   Increase knowledge of respiratory medications and ability to use respiratory devices properly  Yes   Intervention Provide education and demonstration as needed of appropriate use of medications, inhalers, and oxygen therapy.   Expected Outcomes Short Term: Achieves understanding of medications use. Understands that oxygen is a medication prescribed by physician. Demonstrates appropriate use of inhaler and oxygen therapy.      Core Components/Risk Factors/Patient Goals Review:      Goals and Risk Factor Review    Row Name 07/17/17 1035             Core Components/Risk Factors/Patient Goals Review   Personal Goals Review Weight Management/Obesity;Improve shortness of breath with ADL's       Review Bobbi's weight has been holding fairly steady. She only fluctuates about 2-3 lbs.  She is 196 lbs today.  She would like to meet with nutritionist, but would like to wait until after she returns from her cousin's surgery.  She has noticed that her shortness of breath is getting better.  She is able to do more at home now then when she started.  She has found that the PLB is making a big difference for her.       Expected Outcomes Short: Continue to work on weight loss and meet with nutrition.  Long: Continue to work on improving SOB.          Core Components/Risk  Factors/Patient Goals at Discharge (Final Review):      Goals and Risk Factor Review - 07/17/17 1035      Core Components/Risk Factors/Patient Goals Review   Personal Goals Review Weight Management/Obesity;Improve shortness of breath with ADL's   Review Jamaira's weight has been holding fairly steady. She only fluctuates about 2-3 lbs.  She is 196 lbs today.  She would like to meet with nutritionist, but would like to wait until after she returns from her cousin's surgery.  She has noticed that her shortness of breath is getting better.  She is able to do more at home now then when she started.  She has found that the PLB is making a big difference for her.   Expected Outcomes Short: Continue to work on weight loss and meet with nutrition.  Long: Continue to work on improving SOB.      ITP Comments:     ITP Comments    Row Name 06/23/17 1316 07/06/17 0841 07/24/17 1054 08/03/17 0805     ITP Comments Medical Review completed. Documentation for diagnosis can be found in [facility] encounter date 06/03/17  30 day review completed ITP sent to Dr. Ramonita Lab for Dr. Emily Filbert Director of [place]. Continue with ITP unless changes are made by physician.  Javia reports her doctor said for her not to do the treadmill any more during Pulmonary Rehab. Harleyquinn reports that she feels much better walking around our track in our gym instead of walking on the treadmill since she can adjust her walking easier.  30 day review completed. ITP sent to Dr. Emily Filbert Director of [place]. Continue with ITP unless changes are made by physician.         Comments: 30 day review

## 2017-08-03 NOTE — Telephone Encounter (Signed)
Erin [name] called to inform us that she will be out today. She is getting fluid drained off her knee. She says she will be back as soon as she can.

## 2017-08-05 ENCOUNTER — Telehealth: Payer: Self-pay

## 2017-08-05 DIAGNOSIS — J8410 Pulmonary fibrosis, unspecified: Secondary | ICD-10-CM

## 2017-08-05 NOTE — Telephone Encounter (Signed)
Erin [name] called to inform us that her doctor said it was ok to resume her exercise next Wednesday. She had fluid drained from her knee. Her doctor told her to do what she can and if her back pain flares up to take it easy.

## 2017-08-06 ENCOUNTER — Other Ambulatory Visit: Payer: Self-pay | Admitting: Internal Medicine

## 2017-08-06 DIAGNOSIS — Z1231 Encounter for screening mammogram for malignant neoplasm of breast: Secondary | ICD-10-CM

## 2017-08-12 ENCOUNTER — Telehealth: Payer: Self-pay

## 2017-08-12 ENCOUNTER — Encounter: Payer: Medicare Other | Attending: Pulmonary Disease

## 2017-08-12 DIAGNOSIS — J8410 Pulmonary fibrosis, unspecified: Secondary | ICD-10-CM

## 2017-08-12 DIAGNOSIS — R0609 Other forms of dyspnea: Secondary | ICD-10-CM | POA: Insufficient documentation

## 2017-08-12 NOTE — Progress Notes (Signed)
Discharge Progress Report  Patient Details  Name: Erin [name] MRN: [id number] Date of Birth: [birth year] Referring Provider:     Pulmonary Rehab from 06/23/2017 in [facility]  Referring Provider  Simonds       Number of Visits: 13/36  Reason for Discharge:  Early Exit:  Personal  Smoking History:  History  Smoking Status  . Never Smoker  Smokeless Tobacco  . Never Used    Diagnosis:  Pulmonary fibrosis ([place])  ADL UCSD:     Pulmonary Assessment Scores    Row Name 06/23/17 1241         ADL UCSD   ADL Phase Entry     SOB Score total 82     Rest 2     Walk 3     Stairs 4     Bath 3     Dress 3     Shop 5       mMRC Score   mMRC Score 2        Initial Exercise Prescription:     Initial Exercise Prescription - 06/23/17 1100      Date of Initial Exercise RX and Referring Provider   Date 06/23/17   Referring Provider Simonds     Treadmill   MPH 1.3   Grade 0   Minutes 15   METs 2     NuStep   Level 1   SPM 80   Minutes 15   METs 2     REL-XR   Level 1   Speed 50   Minutes 15   METs 2     Biostep-RELP   Level 2   SPM 50   Minutes 15   METs 2     Prescription Details   Frequency (times per week) 3   Duration Progress to 45 minutes of aerobic exercise without signs/symptoms of physical distress     Intensity   THRR 40-80% of Max Heartrate 115-137   Ratings of Perceived Exertion 11-15   Perceived Dyspnea 0-4     Resistance Training   Training Prescription Yes   Weight 2   Reps 10-15      Discharge Exercise Prescription (Final Exercise Prescription Changes):     Exercise Prescription Changes - 07/22/17 1100      Response to Exercise   Blood Pressure (Admit) 124/64   Blood Pressure (Exit) 126/66   Heart Rate (Admit) 85 bpm   Heart Rate (Exercise) 89 bpm   Heart Rate (Exit) 70 bpm   Oxygen Saturation (Admit) 100 %   Oxygen Saturation (Exercise) 99 %   Oxygen Saturation (Exit) 99 %   Rating of  Perceived Exertion (Exercise) 13   Perceived Dyspnea (Exercise) 2   Duration Continue with 45 min of aerobic exercise without signs/symptoms of physical distress.   Intensity THRR unchanged     Progression   Progression Continue to progress workloads to maintain intensity without signs/symptoms of physical distress.   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 HR 117 bpm     Max Ex. BP 164/76       Interval HR   Baseline HR (retired) 94     2 Minute HR 115     3 Minute HR 117     4 Minute HR 115     5 Minute HR 113     6 Minute HR 114     2 Minute Post HR 97     Interval Heart Rate? Yes       Interval Oxygen   Interval Oxygen? Yes     Baseline Oxygen Saturation % 98 %     Resting Liters of Oxygen 0 L     1 Minute Liters of Oxygen 0 L     2 Minute Oxygen Saturation % 74 %   has Raynauds - Sa02 possibly inaccurate     2 Minute Liters of Oxygen 0 L     3 Minute Oxygen Saturation % 87 %     3 Minute Liters of Oxygen 0 L     4 Minute Oxygen Saturation % 80 %     4 Minute Liters of Oxygen 0 L     5 Minute Oxygen Saturation % 84 %     5 Minute Liters of Oxygen 0 L     6 Minute Oxygen Saturation % 92 %     6 Minute Liters of Oxygen 0 L     2 Minute Post Oxygen Saturation % 95 %     2 Minute Post Liters of Oxygen 0 L         Psychological, QOL, Others - Outcomes: PHQ 2/9: Depression screen PHQ 2/9 06/23/2017  Decreased Interest 1  Down, Depressed, Hopeless 1  PHQ - 2 Score 2  Altered sleeping 1  Tired, decreased energy 3  Change in appetite 3  Feeling bad or failure about yourself  1  Trouble concentrating 2  Moving slowly or fidgety/restless 1  Suicidal thoughts 1  PHQ-9 Score 14  Difficult doing work/chores Extremely dIfficult    Quality of Life:     Quality of Life - 06/23/17 1246      Quality of Life Scores   Health/Function Pre 10.78 %   Socioeconomic Pre 18.21 %   Psych/Spiritual Pre 18.29 %   Family Pre 6 %   GLOBAL Pre 13.52 %      Personal Goals: Goals established at orientation with interventions provided to work toward goal.     Personal Goals and Risk Factors at Admission - 06/23/17 1238      Core Components/Risk Factors/Patient Goals on Admission    Weight Management Obesity;Yes;Weight Loss   Intervention Weight Management: Develop a combined nutrition and exercise program designed to reach desired caloric intake, while maintaining  appropriate intake of nutrient and fiber, sodium and fats, and appropriate energy expenditure required for the weight goal.;Obesity: Provide education and appropriate resources to help participant work on and attain dietary goals.;Weight Management: Provide education and appropriate resources to help participant work on and attain dietary goals.;Weight Management/Obesity: Establish reasonable short term and long term weight goals.   Admit Weight 194 lb 1.6 oz (88 kg)   Goal Weight: Short Term 190 lb (86.2 kg)   Goal Weight: Long Term 175 lb (79.4 kg)   Expected Outcomes Understanding of distribution of calorie intake throughout the day with the consumption of 4-5 meals/snacks;Understanding recommendations for meals to include 15-35% energy as protein, 25-35% energy from fat, 35-60% energy from carbohydrates, less than 200mg  of dietary cholesterol,  20-35 gm of total fiber daily;Weight Loss: Understanding of general recommendations for a balanced deficit meal plan, which promotes 1-2 lb weight loss per week and includes a negative energy balance of [phone] kcal/d;Weight Maintenance: Understanding of the daily nutrition guidelines, which includes 25-35% calories from fat, 7% or less cal from saturated fats, less than 200mg  cholesterol, less than 1.5gm of sodium, & 5 or more servings of fruits and vegetables daily;Short Term: Continue to assess and modify interventions until short term weight is achieved;Long Term: Adherence to nutrition and physical activity/exercise program aimed toward attainment of established weight goal   Tobacco Cessation --  never a smoker   Improve shortness of breath with ADL's Yes   Intervention Provide education, individualized exercise plan and daily activity instruction to help decrease symptoms of SOB with activities of daily living.   Expected Outcomes Short Term: Achieves a reduction of symptoms when performing activities of daily living.   Develop more efficient breathing techniques such as purse lipped breathing and diaphragmatic breathing; and practicing self-pacing with activity Yes   Intervention Provide education, demonstration and support about specific breathing techniuqes utilized for more efficient breathing. Include techniques such as pursed lipped breathing, diaphragmatic breathing and self-pacing activity.   Expected Outcomes Short Term: Participant will be able to demonstrate and use breathing techniques as needed throughout daily activities.   Increase knowledge of respiratory medications and ability to use respiratory devices properly  Yes   Intervention Provide education and demonstration as needed of appropriate use of medications, inhalers, and oxygen therapy.   Expected Outcomes Short Term: Achieves understanding of medications use. Understands that oxygen is a medication prescribed by physician.  Demonstrates appropriate use of inhaler and oxygen therapy.       Personal Goals Discharge:     Goals and Risk Factor Review    Row Name 07/17/17 1035             Core Components/Risk Factors/Patient Goals Review   Personal Goals Review Weight Management/Obesity;Improve shortness of breath with ADL's       Review Erin [name]'s weight has been holding fairly steady. She only fluctuates about 2-3 lbs.  She is 196 lbs today.  She would like to meet with nutritionist, but would like to wait until after she returns from her cousin's surgery.  She has noticed that her shortness of breath is getting better.  She is able to do more at home now then when she started.  She has found that the PLB is making a big difference for her.       Expected Outcomes Short: Continue to work on weight loss and meet with nutrition.  Long: Continue to work on improving SOB.  Exercise Goals and Review:     Exercise Goals    Row Name 06/23/17 1151             Exercise Goals   Increase Physical Activity Yes       Intervention Provide advice, education, support and counseling about physical activity/exercise needs.;Develop an individualized exercise prescription for aerobic and resistive training based on initial evaluation findings, risk stratification, comorbidities and participant's personal goals.       Expected Outcomes Achievement of increased cardiorespiratory fitness and enhanced flexibility, muscular endurance and strength shown through measurements of functional capacity and personal statement of participant.       Increase Strength and Stamina Yes       Intervention Provide advice, education, support and counseling about physical activity/exercise needs.;Develop an individualized exercise prescription for aerobic and resistive training based on initial evaluation findings, risk stratification, comorbidities and participant's personal goals.       Expected Outcomes Achievement of increased  cardiorespiratory fitness and enhanced flexibility, muscular endurance and strength shown through measurements of functional capacity and personal statement of participant.          Nutrition & Weight - Outcomes:     Pre Biometrics - 06/23/17 1151      Pre Biometrics   Height 5\' 3"  (1.6 m)   Weight 194 lb 1.6 oz (88 kg)   Waist Circumference 40.75 inches   Hip Circumference 47.75 inches   Waist to Hip Ratio 0.85 %   BMI (Calculated) 34.5       Nutrition:     Nutrition Therapy & Goals - 07/17/17 1044      Nutrition Therapy   RD appointment defered Yes      Nutrition Discharge:   Education Questionnaire Score:     Knowledge Questionnaire Score - 06/23/17 1240      Knowledge Questionnaire Score   Pre Score 8/10      Goals reviewed with patient; copy given to patient.

## 2017-08-12 NOTE — Telephone Encounter (Signed)
Erin [name] has had shots in her knees and has had back trouble due to her RA.  She wants to discharge from [facility] at this point but hopes to return to [name] when she is able.  She really enjoyed LW.

## 2017-08-12 NOTE — Progress Notes (Signed)
Pulmonary Individual Treatment Plan  Patient Details  Name: Erin [name] MRN: [id number] Date of Birth: 25-Sep-1946 Referring Provider:     Pulmonary Rehab from 06/23/2017 in [facility] Rehab  Referring Provider  Simonds      Initial Encounter Date:    Pulmonary Rehab from 06/23/2017 in [facility] Cardiac and Pulmonary Rehab  Date  06/23/17  Referring Provider  Simonds      Visit Diagnosis: Pulmonary fibrosis ([place])  Patient's Home Medications on Admission:  Current Outpatient Prescriptions:  .  ALPRAZolam (XANAX) 0.5 MG tablet, Take 0.17ms twice daily as needed for anxiety, Disp: , Rfl:  .  amLODipine (NORVASC) 5 MG tablet, 5 mg bid, Disp: , Rfl:  .  aspirin EC 81 MG tablet, Take 81 mg by mouth at bedtime. , Disp: , Rfl:  .  cetirizine (ZYRTEC) 10 MG tablet, Take 10 mg by mouth daily as needed for allergies., Disp: , Rfl:  .  Clobetasol Prop Emollient Base (CLOBETASOL PROPIONATE E) 0.05 % emollient cream, Apply 1 application topically 2 (two) times daily as needed (sores). , Disp: , Rfl:  .  dexlansoprazole (DEXILANT) 60 MG capsule, Take 60 mg by mouth at bedtime. , Disp: , Rfl:  .  ezetimibe (ZETIA) 10 MG tablet, Take 1 tablet (10 mg total) by mouth daily., Disp: 30 tablet, Rfl: 11 .  FLUoxetine (PROZAC) 20 MG capsule, 60 mg by mouth every morning, Disp: , Rfl:  .  folic acid (FOLVITE) 4672MCG tablet, Take 400 mcg by mouth daily. , Disp: , Rfl:  .  furosemide (LASIX) 20 MG tablet, Take 247m daily as needed for swelling, Disp: , Rfl:  .  gabapentin (NEURONTIN) 100 MG capsule, Take by mouth., Disp: , Rfl:  .  hydroxychloroquine (PLAQUENIL) 200 MG tablet, Take by mouth daily. , Disp: , Rfl:  .  latanoprost (XALATAN) 0.005 % ophthalmic solution, Place 1 drop into both eyes at bedtime. , Disp: , Rfl:  .  lidocaine (XYLOCAINE) 2 % jelly, Apply 1 application topically as needed. Apply to affected area 4 times daily as needed, Disp: 30 mL, Rfl: 0 .  Multiple Vitamin  (MULTI-VITAMINS) TABS, Take 1 tablet by mouth daily. , Disp: , Rfl:  .  Omega-3 Fatty Acids (FISH OIL PO), Take 1 capsule by mouth daily. , Disp: , Rfl:  .  predniSONE (DELTASONE) 5 MG tablet, Take 5 mg by mouth daily with breakfast., Disp: , Rfl:  .  simvastatin (ZOCOR) 80 MG tablet, Take 8039mat bedtime, Disp: , Rfl:  .  zolpidem (AMBIEN) 10 MG tablet, Take 10 mg by mouth at bedtime as needed for sleep. , Disp: , Rfl:   Past Medical History: Past Medical History:  Diagnosis Date  . Anemia    h/o  . Cancer ([place]  melanoma  . Coronary artery disease   . DDD (degenerative disc disease), lumbar   . Dysphagia   . Dyspnea    with exertion-unable to walk a mile without getting sob- dr sparks set pt up to see Dr GolRockey Situter 11-19-16 surgery  . Esophageal dysmotility   . Fibrocystic breast disease   . GERD (gastroesophageal reflux disease)   . Heart murmur    h/o   . Hyperlipidemia   . Hypertension   . Mitral regurgitation   . Osteopenia   . Pulmonary fibrosis ([place]  per Dr FleRaul Del RA (rheumatoid arthritis) ([place] . Raynaud's disease   . Scleroderma ([place]  Tobacco Use: History  Smoking Status  . Never Smoker  Smokeless Tobacco  . Never Used    Labs: Recent Review Flowsheet Data    There is no flowsheet data to display.       Pulmonary Assessment Scores:     Pulmonary Assessment Scores    Row Name 06/23/17 1241         ADL UCSD   ADL Phase Entry     SOB Score total 82     Rest 2     Walk 3     Stairs 4     Bath 3     Dress 3     Shop 5       mMRC Score   mMRC Score 2        Pulmonary Function Assessment:     Pulmonary Function Assessment - 06/23/17 1240      Pulmonary Function Tests   FVC% 97 %   FEV1% 91 %   FEV1/FVC Ratio 74   RV% 93 %   DLCO% 54 %      Exercise Target Goals:    Exercise Program Goal: Individual exercise prescription set with THRR, safety & activity barriers. Participant demonstrates ability to  understand and report RPE using BORG scale, to self-measure pulse accurately, and to acknowledge the importance of the exercise prescription.  Exercise Prescription Goal: Starting with aerobic activity 30 plus minutes a day, 3 days per week for initial exercise prescription. Provide home exercise prescription and guidelines that participant acknowledges understanding prior to discharge.  Activity Barriers & Risk Stratification:   6 Minute Walk:     6 Minute Walk    Row Name 06/23/17 1152         6 Minute Walk   Phase Initial     Distance 845 feet     Walk Time 5 minutes     # of Rest Breaks 3     MPH 1.92     METS 2.02     RPE 15     Perceived Dyspnea  3     VO2 Peak 7.07     Symptoms Yes (comment)     Comments back pain 6/10 - has raynauds so SaO2 may not be accurate     Resting HR 94 bpm     Resting BP 134/66     Max Ex. HR 117 bpm     Max Ex. BP 164/76       Interval HR   Baseline HR (retired) 94     2 Minute HR 115     3 Minute HR 117     4 Minute HR 115     5 Minute HR 113     6 Minute HR 114     2 Minute Post HR 97     Interval Heart Rate? Yes       Interval Oxygen   Interval Oxygen? Yes     Baseline Oxygen Saturation % 98 %     Resting Liters of Oxygen 0 L     1 Minute Liters of Oxygen 0 L     2 Minute Oxygen Saturation % 74 %   has Raynauds - Sa02 possibly inaccurate     2 Minute Liters of Oxygen 0 L     3 Minute Oxygen Saturation % 87 %     3 Minute Liters of Oxygen 0 L     4 Minute Oxygen Saturation % 80 %  4 Minute Liters of Oxygen 0 L     5 Minute Oxygen Saturation % 84 %     5 Minute Liters of Oxygen 0 L     6 Minute Oxygen Saturation % 92 %     6 Minute Liters of Oxygen 0 L     2 Minute Post Oxygen Saturation % 95 %     2 Minute Post Liters of Oxygen 0 L       Oxygen Initial Assessment:     Oxygen Initial Assessment - 06/23/17 1240      Home Oxygen   Home Oxygen Device None   Sleep Oxygen Prescription None   Home Exercise Oxygen  Prescription None   Home at Rest Exercise Oxygen Prescription None     Program Oxygen Prescription   Program Oxygen Prescription None     Intervention   Short Term Goals To learn and understand importance of monitoring SPO2 with pulse oximeter and demonstrate accurate use of the pulse oximeter.;To Learn and understand importance of maintaining oxygen saturations>88%;To learn and demonstrate proper purse lipped breathing techniques or other breathing techniques.;To learn and demonstrate proper use of respiratory medications   Long  Term Goals Maintenance of O2 saturations>88%;Compliance with respiratory medication;Demonstrates proper use of MDI's;Exhibits proper breathing techniques, such as purse lipped breathing or other method taught during program session;Verbalizes importance of monitoring SPO2 with pulse oximeter and return demonstration      Oxygen Re-Evaluation:     Oxygen Re-Evaluation    Row Name 06/29/17 1102 07/17/17 1039           Program Oxygen Prescription   Program Oxygen Prescription  - None        Home Oxygen   Home Oxygen Device  - None      Sleep Oxygen Prescription  - None      Home Exercise Oxygen Prescription  - None      Home at Rest Exercise Oxygen Prescription  - None        Goals/Expected Outcomes   Short Term Goals To learn and demonstrate proper purse lipped breathing techniques or other breathing techniques. To learn and understand importance of monitoring SPO2 with pulse oximeter and demonstrate accurate use of the pulse oximeter.;To Learn and understand importance of maintaining oxygen saturations>88%;To learn and demonstrate proper purse lipped breathing techniques or other breathing techniques.;To learn and demonstrate proper use of respiratory medications      Long  Term Goals  - Maintenance of O2 saturations>88%;Verbalizes importance of monitoring SPO2 with pulse oximeter and return demonstration;Exhibits proper breathing techniques, such as  purse lipped breathing or other method taught during program session;Compliance with respiratory medication      Comments Pursed lip breathing discussed and demonstrated with patient. Patient demonstrated understanding of this breathing technique and when to use it during exercise and ADL's.  Erin [name] has been able to maintain good saturations.   She is not able to use a pulse oximeter on her own as her hands stay too cold. We are using a forehead probe during classes.  She has been using the PLB regularly and no longer needs queueing for it.  She has also worked some on her diaphragmatic breathing  for relaxation.  She has been compliant with her medications and has no problem gettign them or using them.      Goals/Expected Outcomes Short: Patient will use PLB during exercise class to help control SOB. Long: Patient will independently encorporate PLB techniques thoughout daily life activities and  exercise.  Short: Continue to use PLB independently to improve SOB at home.  Long: Maintain good oxygen saturations and staying on top of her respiratory medications.         Oxygen Discharge (Final Oxygen Re-Evaluation):     Oxygen Re-Evaluation - 07/17/17 1039      Program Oxygen Prescription   Program Oxygen Prescription None     Home Oxygen   Home Oxygen Device None   Sleep Oxygen Prescription None   Home Exercise Oxygen Prescription None   Home at Rest Exercise Oxygen Prescription None     Goals/Expected Outcomes   Short Term Goals To learn and understand importance of monitoring SPO2 with pulse oximeter and demonstrate accurate use of the pulse oximeter.;To Learn and understand importance of maintaining oxygen saturations>88%;To learn and demonstrate proper purse lipped breathing techniques or other breathing techniques.;To learn and demonstrate proper use of respiratory medications   Long  Term Goals Maintenance of O2 saturations>88%;Verbalizes importance of monitoring SPO2 with pulse oximeter  and return demonstration;Exhibits proper breathing techniques, such as purse lipped breathing or other method taught during program session;Compliance with respiratory medication   Comments Erin [name] has been able to maintain good saturations.   She is not able to use a pulse oximeter on her own as her hands stay too cold. We are using a forehead probe during classes.  She has been using the PLB regularly and no longer needs queueing for it.  She has also worked some on her diaphragmatic breathing  for relaxation.  She has been compliant with her medications and has no problem gettign them or using them.   Goals/Expected Outcomes Short: Continue to use PLB independently to improve SOB at home.  Long: Maintain good oxygen saturations and staying on top of her respiratory medications.      Initial Exercise Prescription:     Initial Exercise Prescription - 06/23/17 1100      Date of Initial Exercise RX and Referring Provider   Date 06/23/17   Referring Provider Simonds     Treadmill   MPH 1.3   Grade 0   Minutes 15   METs 2     NuStep   Level 1   SPM 80   Minutes 15   METs 2     REL-XR   Level 1   Speed 50   Minutes 15   METs 2     Biostep-RELP   Level 2   SPM 50   Minutes 15   METs 2     Prescription Details   Frequency (times per week) 3   Duration Progress to 45 minutes of aerobic exercise without signs/symptoms of physical distress     Intensity   THRR 40-80% of Max Heartrate 115-137   Ratings of Perceived Exertion 11-15   Perceived Dyspnea 0-4     Resistance Training   Training Prescription Yes   Weight 2   Reps 10-15      Perform Capillary Blood Glucose checks as needed.  Exercise Prescription Changes:     Exercise Prescription Changes    Row Name 06/29/17 1100 07/08/17 1000 07/09/17 1300 07/22/17 1100       Response to Exercise   Blood Pressure (Admit) 130/70  - 124/70 124/64    Blood Pressure (Exercise) 142/80  - 146/72  -    Blood Pressure (Exit)  124/62  - 136/64 126/66    Heart Rate (Admit) 94 bpm  - 85 bpm 85 bpm    Heart  Rate (Exercise) 101 bpm  - 119 bpm 89 bpm    Heart Rate (Exit) 80 bpm  - 85 bpm 70 bpm    Oxygen Saturation (Admit) 98 %  - 100 % 100 %    Oxygen Saturation (Exercise) 92 %  - 97 % 99 %    Oxygen Saturation (Exit) 99 %  - 98 % 99 %    Rating of Perceived Exertion (Exercise) 15  - 13 13    Perceived Dyspnea (Exercise) 3  - 3 2    Duration  -  - Continue with 45 min of aerobic exercise without signs/symptoms of physical distress. Continue with 45 min of aerobic exercise without signs/symptoms of physical distress.    Intensity  -  - THRR unchanged THRR unchanged      Progression   Progression  -  - Continue to progress workloads to maintain intensity without signs/symptoms of physical distress. Continue to progress workloads to maintain intensity without signs/symptoms of physical distress.    Average METs  -  - 3.15 3      Resistance Training   Training Prescription Yes  - Yes Yes    Weight 2  - 2 2    Reps 10-15  - 10-15 10-15      Interval Training   Interval Training  -  - No No      Treadmill   MPH 1.3  -  -  -    Grade 0  -  -  -    Minutes 15  -  -  -    METs 2  -  -  -      NuStep   Level 1  -  - 4    SPM 80  -  - 76    Minutes 15  -  - 15    METs 2  -  - 2.4      REL-XR   Level 1  - 2 4    Speed 50  - 56 49    Minutes 15  - 15 15    METs 2  - 4.2 3.6      Track   Laps  -  - 25  -    Minutes  -  - 15  -    METs  -  - 2.15  -      Home Exercise Plan   Plans to continue exercise at  - Erin [name]  -  -    Frequency  - Add 1 additional day to program exercise sessions.  -  -    Initial Home Exercises Provided  - 07/08/17  -  -       Exercise Comments:     Exercise Comments    Row Name 06/29/17 1059 07/08/17 1104 07/24/17 1055 08/05/17 1147     Exercise Comments First full day of exercise!  Patient was oriented to gym and equipment including functions, settings, policies, and  procedures.  Patient's individual exercise prescription and treatment plan were reviewed.  All starting workloads were established based on the results of the 6 minute walk test done at initial orientation visit.  The plan for exercise progression was also introduced and progression will be customized based on patient's performance and goals Erin [name] is considering joining [employer] when she completes LW.  She will add some strength and stretching at home.  Her hands bother her due to RA so she plans to try wearing gloves.  Erin [name] reports her doctor said for her not to do the treadmill any more during Pulmonary Rehab. Erin [name] reports that she feels much better walking around our track in our gym instead of walking on the treadmill since she can adjust her walking easier.  Erin [name] has not attended since 8/17.  She has spoken with Wille Glaser and plans to return next week.       Exercise Goals and Review:     Exercise Goals    Row Name 06/23/17 1151             Exercise Goals   Increase Physical Activity Yes       Intervention Provide advice, education, support and counseling about physical activity/exercise needs.;Develop an individualized exercise prescription for aerobic and resistive training based on initial evaluation findings, risk stratification, comorbidities and participant's personal goals.       Expected Outcomes Achievement of increased cardiorespiratory fitness and enhanced flexibility, muscular endurance and strength shown through measurements of functional capacity and personal statement of participant.       Increase Strength and Stamina Yes       Intervention Provide advice, education, support and counseling about physical activity/exercise needs.;Develop an individualized exercise prescription for aerobic and resistive training based on initial evaluation findings, risk stratification, comorbidities and participant's personal goals.       Expected Outcomes Achievement of increased  cardiorespiratory fitness and enhanced flexibility, muscular endurance and strength shown through measurements of functional capacity and personal statement of participant.          Exercise Goals Re-Evaluation :     Exercise Goals Re-Evaluation    Row Name 07/08/17 1058 07/22/17 1201           Exercise Goal Re-Evaluation   Exercise Goals Review Increase Physical Activity;Increase Strenth and Stamina Increase Physical Activity;Increase Strenth and Stamina      Comments Erin [name] is considering joining [employer] when she completes LW.  She will add some strength and stretching at home.  Her hands bother her due to RA so she plans to try wearing gloves. Erin [name] has added walking at the mall with friends on Sunday to her exercise routine.  She has had some back pain.  I recommended she do exercises shes done in PT and reviewed pelvic tilts and knee to chest with her.  Her hands feel better since she is wearing gloves.      Expected Outcomes Short  - Aylanie will begin to exercise on her own at home and continue to attend regularly.  Long - Addasyn will join FF and maintain her exercise program.  Short - Thomasina will continue to walk on days not at [facility].  Long - Kensli will continue regular exercise.         Discharge Exercise Prescription (Final Exercise Prescription Changes):     Exercise Prescription Changes - 07/22/17 1100      Response to Exercise   Blood Pressure (Admit) 124/64   Blood Pressure (Exit) 126/66   Heart Rate (Admit) 85 bpm   Heart Rate (Exercise) 89 bpm   Heart Rate (Exit) 70 bpm   Oxygen Saturation (Admit) 100 %   Oxygen Saturation (Exercise) 99 %   Oxygen Saturation (Exit) 99 %   Rating of Perceived Exertion (Exercise) 13   Perceived Dyspnea (Exercise) 2   Duration Continue with 45 min of aerobic exercise without signs/symptoms of physical distress.   Intensity THRR unchanged     Progression   Progression Continue to progress workloads to  maintain intensity without  signs/symptoms of physical distress.   Average METs 3     Resistance Training   Training Prescription Yes   Weight 2   Reps 10-15     Interval Training   Interval Training No     NuStep   Level 4   SPM 76   Minutes 15   METs 2.4     REL-XR   Level 4   Speed 49   Minutes 15   METs 3.6      Nutrition:  Target Goals: Understanding of nutrition guidelines, daily intake of sodium <1566m, cholesterol <2076m calories 30% from fat and 7% or less from saturated fats, daily to have 5 or more servings of fruits and vegetables.  Biometrics:     Pre Biometrics - 06/23/17 1151      Pre Biometrics   Height 5' 3"  (1.6 m)   Weight 194 lb 1.6 oz (88 kg)   Waist Circumference 40.75 inches   Hip Circumference 47.75 inches   Waist to Hip Ratio 0.85 %   BMI (Calculated) 34.5       Nutrition Therapy Plan and Nutrition Goals:     Nutrition Therapy & Goals - 07/17/17 1044      Nutrition Therapy   RD appointment defered Yes      Nutrition Discharge: Rate Your Plate Scores:   Nutrition Goals Re-Evaluation:     Nutrition Goals Re-Evaluation    Row Name 07/17/17 10[place] like to meet with the dietician but she would like to defer until after her cousin's surgery.       Expected Outcome Short: Get through cousin's surgery and staying diet concious.  Long: Meet with dietician to work on diet.          Nutrition Goals Discharge (Final Nutrition Goals Re-Evaluation):     Nutrition Goals Re-Evaluation - 07/17/17 10[place] like to meet with the dietician but she would like to defer until after her cousin's surgery.   Expected Outcome Short: Get through cousin's surgery and staying diet concious.  Long: Meet with dietician to work on diet.      Psychosocial: Target Goals: Acknowledge presence or absence of significant depression and/or stress, maximize coping skills, provide positive support system.  Participant is able to verbalize types and ability to use techniques and skills needed for reducing stress and depression.   Initial Review & Psychosocial Screening:     Initial Psych Review & Screening - 06/23/17 1244      Initial Review   Current issues with Current Depression;History of Depression;Current Stress Concerns   Source of Stress Concerns Family;Unable to perform yard/household activities   Comments One of her two sons died in the past, One of her other sons is Bipolar and she states that he can be stressful. Patient lives alone and sometimes she thinks about the future and not being able to take care of herself     [place]   Strains Intra-family strains   Comments son is a good source of support but is Bipolar     Barriers   Psychosocial barriers to participate in program Psychosocial barriers identified (see note);The patient should benefit from training in stress management and relaxation.  See PHQ and QOL scores     Screening Interventions   Interventions  Encouraged to exercise;To provide support and resources with identified psychosocial needs;Program counselor consult;Yes   Expected Outcomes Short Term goal: Utilizing psychosocial counselor, staff and physician to assist with identification of specific Stressors or current issues interfering with healing process. Setting desired goal for each stressor or current issue identified.;Long Term goal: The participant improves quality of Life and PHQ9 Scores as seen by post scores and/or verbalization of changes;Short Term goal: Identification and review with participant of any Quality of Life or Depression concerns found by scoring the questionnaire.;Long Term Goal: Stressors or current issues are controlled or eliminated.      Quality of Life Scores:     Quality of Life - 06/23/17 1246      Quality of Life Scores   Health/Function Pre 10.78 %   Socioeconomic Pre 18.21 %    Psych/Spiritual Pre 18.29 %   Family Pre 6 %   GLOBAL Pre 13.52 %      PHQ-9: Recent Review Flowsheet Data    Depression screen [facility] 2/9 06/23/2017   Decreased Interest 1   Down, Depressed, Hopeless 1   PHQ - 2 Score 2   Altered sleeping 1   Tired, decreased energy 3   Change in appetite 3   Feeling bad or failure about yourself  1   Trouble concentrating 2   Moving slowly or fidgety/restless 1   Suicidal thoughts 1   PHQ-9 Score 14   Difficult doing work/chores Extremely dIfficult     Interpretation of Total Score  Total Score Depression Severity:  1-4 = Minimal depression, 5-9 = Mild depression, 10-14 = Moderate depression, 15-19 = Moderately severe depression, 20-27 = Severe depression   Psychosocial Evaluation and Intervention:     Psychosocial Evaluation - 07/01/17 1157      Psychosocial Evaluation & Interventions   Interventions Encouraged to exercise with the program and follow exercise prescription;Relaxation education;Stress management education   Comments Counselor met with Ms. Recore ([place]) today for initial psychosocial evaluation.  She is a 71 year old who has pulmonary fibrosis and reports having fallen 10 weeks ago and broke several ribs.  Elias has multiple health issues with Arthritis; Scleraderma and cardiac and pulmonary issues.  She also has difficulty sleeping at night which has been a chronic problem for her.  She takes medication which helps occasionally.  Her appetite has recently returned and is better now.  She admits to a history of depression and anxiety for approximately 20+ years subsequent to her son dying at the age of 50.  Erin [name] is on medication for this and states it is helpful at times.  She admits her mood is stable at this time; but she "feels alone" often with her spouses' death 8 years ago and her two remaining sons having mental illness and not able to be support for her.  Erin [name] leans on her church family mostly  and a cousin as her support  system.  She has goals to increase her stamina and strength; sleep better; and be able to take care of her self and her home better.  Counselor will monitor Erin [name] throughout the course of this program   Expected Outcomes Martisha will benefit from consistent exercise to achieve her stated goals; she will also benefit from the psychoeducational components of this program to increase her coping strategies and learn ways to relax and calm to hopefully sleep better.        Psychosocial Re-Evaluation:     Psychosocial Re-Evaluation    Row Name  07/06/17 1100 07/15/17 1117 07/17/17 1046         Psychosocial Re-Evaluation   Current issues with History of Depression  - History of Depression;Current Stress Concerns     Comments Counselor Follow up with Erin [name] today reporting sleeping better since started program and is reading a book at night to help relax.  She also reports she is exercising better with the use of a walker starting today - which decreases her back pain and helps her exercise longer.  Erin [name] continues to have a stable mood and reports she copes with all the stress in her life with her health and family issues by relying on her faith and strong support system of friends.  Counselor commended Erin [name] on her commitment to exercise and attend consistently.  Counselor follow with Erin [name] today stating she is breathing better finally now since the fall 10 weeks ago wherein she cracked some ribs.  She reports sleeping well and coping well currently.  Her mood is stable at this time.  Counselor commended Erin [name] on her hard work and progress made.   Erin [name] has been sleeping pretty good.  She reads at night to help calm herself to sleep. We talked about trying out new authors.  She is going to look into [employer].  Her stress levels remain moderate at best.  She is about to lose her primary support system as she will become her cousin's caregiver post surgery.  Usually they rely on each other to pull the  other up.  However, with her cousin's upcoming surgery she is concerned about who is going to help lift her up while she is down. I told her to use [place] to help her if needed.     Expected Outcomes Erin [name] will continue to exercise and learn ways to relax and cope with her life stressors.    - Short: Lean on [place] during her cousin's surgery and find ways to cope.  Long: Continue to work on coping with stressors.     Interventions Relaxation education  - Relaxation education;Stress management education;Encouraged to attend Pulmonary Rehabilitation for the exercise     Continue Psychosocial Services   -  - Follow up required by staff     Comments  -  - Cousin undergoing surgery and still cannot rely on her sons for support.        Initial Review   Source of Stress Concerns  -  - Family;Chronic Illness        Psychosocial Discharge (Final Psychosocial Re-Evaluation):     Psychosocial Re-Evaluation - 07/17/17 1046      Psychosocial Re-Evaluation   Current issues with History of Depression;Current Stress Concerns   Comments Erin [name] has been sleeping pretty good.  She reads at night to help calm herself to sleep. We talked about trying out new authors.  She is going to look into [employer].  Her stress levels remain moderate at best.  She is about to lose her primary support system as she will become her cousin's caregiver post surgery.  Usually they rely on each other to pull the other up.  However, with her cousin's upcoming surgery she is concerned about who is going to help lift her up while she is down. I told her to use [place] to help her if needed.   Expected Outcomes Short: Lean on [place] during her cousin's surgery and find ways to cope.  Long: Continue to work on coping with stressors.   Interventions Relaxation  education;Stress management education;Encouraged to attend Pulmonary Rehabilitation for the exercise   Continue Psychosocial Services  Follow up required by staff   Comments Cousin  undergoing surgery and still cannot rely on her sons for support.      Initial Review   Source of Stress Concerns Family;Chronic Illness      Education: Education Goals: Education classes will be provided on a weekly basis, covering required topics. Participant will state understanding/return demonstration of topics presented.  Learning Barriers/Preferences:     Learning Barriers/Preferences - 06/23/17 1240      Learning Barriers/Preferences   Learning Barriers None   Learning Preferences None      Education Topics: Initial Evaluation Education: - Verbal, written and demonstration of respiratory meds, RPE/PD scales, oximetry and breathing techniques. Instruction on use of nebulizers and MDIs: cleaning and proper use, rinsing mouth with steroid doses and importance of monitoring MDI activations.   Pulmonary Rehab from 07/22/2017 in [facility] Cardiac and Pulmonary Rehab  Date  06/23/17  Educator  [facility]  Instruction Review Code (retired)  2- meets goals/outcomes      General Nutrition Guidelines/Fats and Fiber: -Group instruction provided by verbal, written material, models and posters to present the general guidelines for heart healthy nutrition. Gives an explanation and review of dietary fats and fiber.   Controlling Sodium/Reading Food Labels: -Group verbal and written material supporting the discussion of sodium use in heart healthy nutrition. Review and explanation with models, verbal and written materials for utilization of the food label.   Exercise Physiology & Risk Factors: - Group verbal and written instruction with models to review the exercise physiology of the cardiovascular system and associated critical values. Details cardiovascular disease risk factors and the goals associated with each risk factor.   Aerobic Exercise & Resistance Training: - Gives group verbal and written discussion on the health impact of inactivity. On the components of aerobic and resistive  training programs and the benefits of this training and how to safely progress through these programs.   Pulmonary Rehab from 07/22/2017 in [facility] Cardiac and Pulmonary Rehab  Date  07/03/17  Educator  [facility]  Instruction Review Code (retired)  2- Statistician, Balance, General Exercise Guidelines: - Provides group verbal and written instruction on the benefits of flexibility and balance training programs. Provides general exercise guidelines with specific guidelines to those with heart or lung disease. Demonstration and skill practice provided.   Pulmonary Rehab from 07/22/2017 in [facility] Cardiac and Pulmonary Rehab  Date  07/22/17  Educator  [facility]  Instruction Review Code (retired)  2- meets goals/outcomes      Stress Management: - Provides group verbal and written instruction about the health risks of elevated stress, cause of high stress, and healthy ways to reduce stress.   Depression: - Provides group verbal and written instruction on the correlation between heart/lung disease and depressed mood, treatment options, and the stigmas associated with seeking treatment.   Exercise & Equipment Safety: - Individual verbal instruction and demonstration of equipment use and safety with use of the equipment.   Pulmonary Rehab from 07/22/2017 in [facility] Cardiac and Pulmonary Rehab  Date  06/29/17  Educator  [facility]  Instruction Review Code (retired)  2- meets goals/outcomes      Infection Prevention: - Provides verbal and written material to individual with discussion of infection control including proper hand washing and proper equipment cleaning during exercise session.   Pulmonary Rehab from 07/22/2017 in [facility] Cardiac and Pulmonary Rehab  Date  06/29/17  Educator  [facility]  Instruction Review Code (retired)  2- meets [employer] Prevention: - Provides verbal and written material to individual with discussion of falls prevention and safety.   Pulmonary Rehab from  07/22/2017 in [facility] Cardiac and Pulmonary Rehab  Date  06/29/17  Educator  [facility]  Instruction Review Code (retired)  2- meets goals/outcomes      Diabetes: - Individual verbal and written instruction to review signs/symptoms of diabetes, desired ranges of glucose level fasting, after meals and with exercise. Advice that pre and post exercise glucose checks will be done for 3 sessions at entry of program.   Chronic Lung Diseases: - Group verbal and written instruction to review new updates, new respiratory medications, new advancements in procedures and treatments. Provide informative websites and "800" numbers of self-education.   Lung Procedures: - Group verbal and written instruction to describe testing methods done to diagnose lung disease. Review the outcome of test results. Describe the treatment choices: Pulmonary Function Tests, ABGs and oximetry.   Energy Conservation: - Provide group verbal and written instruction for methods to conserve energy, plan and organize activities. Instruct on pacing techniques, use of adaptive equipment and posture/positioning to relieve shortness of breath.   Triggers: - Group verbal and written instruction to review types of environmental controls: home humidity, furnaces, filters, dust mite/pet prevention, HEPA vacuums. To discuss weather changes, air quality and the benefits of nasal washing.   Pulmonary Rehab from 07/22/2017 in [facility] Cardiac and Pulmonary Rehab  Date  07/08/17  Educator  [facility]  Instruction Review Code (retired)  2- meets goals/outcomes      Exacerbations: - Group verbal and written instruction to provide: warning signs, infection symptoms, calling MD promptly, preventive modes, and value of vaccinations. Review: effective airway clearance, coughing and/or vibration techniques. Create an Sports administrator.   Pulmonary Rehab from 07/22/2017 in [facility] Cardiac and Pulmonary Rehab  Date  07/08/17  Educator  [facility]  Instruction Review Code (retired)   2- meets goals/outcomes      Oxygen: - Individual and group verbal and written instruction on oxygen therapy. Includes supplement oxygen, available portable oxygen systems, continuous and intermittent flow rates, oxygen safety, concentrators, and Medicare reimbursement for oxygen.   Pulmonary Rehab from 07/22/2017 in [facility] Cardiac and Pulmonary Rehab  Date  06/23/17  Educator  [facility]  Instruction Review Code (retired)  2- meets goals/outcomes      Respiratory Medications: - Group verbal and written instruction to review medications for lung disease. Drug class, frequency, complications, importance of spacers, rinsing mouth after steroid MDI's, and proper cleaning methods for nebulizers.   Pulmonary Rehab from 07/22/2017 in [facility] Cardiac and Pulmonary Rehab  Date  06/23/17  Educator  [facility]  Instruction Review Code (retired)  2- meets goals/outcomes      AED/CPR: - Group verbal and written instruction with the use of models to demonstrate the basic use of the AED with the basic ABC's of resuscitation.   Breathing Retraining: - Provides individuals verbal and written instruction on purpose, frequency, and proper technique of diaphragmatic breathing and pursed-lipped breathing. Applies individual practice skills.   Pulmonary Rehab from 07/22/2017 in [facility] Cardiac and Pulmonary Rehab  Date  06/23/17  Educator  [facility]  Instruction Review Code (retired)  2- Lawyer and Physiology of the Lungs: - Group verbal and written instruction with the use of models to provide basic lung anatomy and physiology related to  function, structure and complications of lung disease.   Anatomy & Physiology of the Heart: - Group verbal and written instruction and models provide basic cardiac anatomy and physiology, with the coronary electrical and arterial systems. Review of: AMI, Angina, Valve disease, Heart Failure, Cardiac Arrhythmia, Pacemakers, and the ICD.   Pulmonary Rehab from 07/22/2017  in [facility] Cardiac and Pulmonary Rehab  Date  07/17/17  Educator  [facility]  Instruction Review Code (retired)  2- meets goals/outcomes      Heart Failure: - Group verbal and written instruction on the basics of heart failure: signs/symptoms, treatments, explanation of ejection fraction, enlarged heart and cardiomyopathy.   Pulmonary Rehab from 07/22/2017 in [facility] Cardiac and Pulmonary Rehab  Date  07/17/17  Educator  [facility]  Instruction Review Code (retired)  2- meets goals/outcomes      Sleep Apnea: - Individual verbal and written instruction to review Obstructive Sleep Apnea. Review of risk factors, methods for diagnosing and types of masks and machines for OSA.   Anxiety: - Provides group, verbal and written instruction on the correlation between heart/lung disease and anxiety, treatment options, and management of anxiety.   Relaxation: - Provides group, verbal and written instruction about the benefits of relaxation for patients with heart/lung disease. Also provides patients with examples of relaxation techniques.   Pulmonary Rehab from 07/22/2017 in [facility] Cardiac and Pulmonary Rehab  Date  07/01/17  Educator  [facility]  Instruction Review Code (retired)  2- Meets goals/outcomes      Cardiac Medications: - Group verbal and written instruction to review commonly prescribed medications for heart disease. Reviews the medication, class of the drug, and side effects.   Know Your Numbers: -Group verbal and written instruction about important numbers in your health.  Review of Cholesterol, Blood Pressure, Diabetes, and BMI and the role they play in your overall health.   Other: -Provides group and verbal instruction on various topics (see comments)    Knowledge Questionnaire Score:     Knowledge Questionnaire Score - 06/23/17 1240      Knowledge Questionnaire Score   Pre Score 8/10       Core Components/Risk Factors/Patient Goals at Admission:     Personal Goals and Risk Factors at  Admission - 06/23/17 1238      Core Components/Risk Factors/Patient Goals on Admission    Weight Management Obesity;Yes;Weight Loss   Intervention Weight Management: Develop a combined nutrition and exercise program designed to reach desired caloric intake, while maintaining appropriate intake of nutrient and fiber, sodium and fats, and appropriate energy expenditure required for the weight goal.;Obesity: Provide education and appropriate resources to help participant work on and attain dietary goals.;Weight Management: Provide education and appropriate resources to help participant work on and attain dietary goals.;Weight Management/Obesity: Establish reasonable short term and long term weight goals.   Admit Weight 194 lb 1.6 oz (88 kg)   Goal Weight: Short Term 190 lb (86.2 kg)   Goal Weight: Long Term 175 lb (79.4 kg)   Expected Outcomes Understanding of distribution of calorie intake throughout the day with the consumption of 4-5 meals/snacks;Understanding recommendations for meals to include 15-35% energy as protein, 25-35% energy from fat, 35-60% energy from carbohydrates, less than 257m of dietary cholesterol, 20-35 gm of total fiber daily;Weight Loss: Understanding of general recommendations for a balanced deficit meal plan, which promotes 1-2 lb weight loss per week and includes a negative energy balance of [phone] kcal/d;Weight Maintenance: Understanding of the daily nutrition guidelines, which includes 25-35% calories from fat,  7% or less cal from saturated fats, less than 28m cholesterol, less than 1.5gm of sodium, & 5 or more servings of fruits and vegetables daily;Short Term: Continue to assess and modify interventions until short term weight is achieved;Long Term: Adherence to nutrition and physical activity/exercise program aimed toward attainment of established weight goal   Tobacco Cessation --  never a smoker   Improve shortness of breath with ADL's Yes   Intervention Provide  education, individualized exercise plan and daily activity instruction to help decrease symptoms of SOB with activities of daily living.   Expected Outcomes Short Term: Achieves a reduction of symptoms when performing activities of daily living.   Develop more efficient breathing techniques such as purse lipped breathing and diaphragmatic breathing; and practicing self-pacing with activity Yes   Intervention Provide education, demonstration and support about specific breathing techniuqes utilized for more efficient breathing. Include techniques such as pursed lipped breathing, diaphragmatic breathing and self-pacing activity.   Expected Outcomes Short Term: Participant will be able to demonstrate and use breathing techniques as needed throughout daily activities.   Increase knowledge of respiratory medications and ability to use respiratory devices properly  Yes   Intervention Provide education and demonstration as needed of appropriate use of medications, inhalers, and oxygen therapy.   Expected Outcomes Short Term: Achieves understanding of medications use. Understands that oxygen is a medication prescribed by physician. Demonstrates appropriate use of inhaler and oxygen therapy.      Core Components/Risk Factors/Patient Goals Review:      Goals and Risk Factor Review    Row Name 07/17/17 1035             Core Components/Risk Factors/Patient Goals Review   Personal Goals Review Weight Management/Obesity;Improve shortness of breath with ADL's       Review Erin [name] weight has been holding fairly steady. She only fluctuates about 2-3 lbs.  She is 196 lbs today.  She would like to meet with nutritionist, but would like to wait until after she returns from her cousin's surgery.  She has noticed that her shortness of breath is getting better.  She is able to do more at home now then when she started.  She has found that the PLB is making a big difference for her.       Expected Outcomes Short:  Continue to work on weight loss and meet with nutrition.  Long: Continue to work on improving SOB.          Core Components/Risk Factors/Patient Goals at Discharge (Final Review):      Goals and Risk Factor Review - 07/17/17 1035      Core Components/Risk Factors/Patient Goals Review   Personal Goals Review Weight Management/Obesity;Improve shortness of breath with ADL's   Review Erin [name]'s weight has been holding fairly steady. She only fluctuates about 2-3 lbs.  She is 196 lbs today.  She would like to meet with nutritionist, but would like to wait until after she returns from her cousin's surgery.  She has noticed that her shortness of breath is getting better.  She is able to do more at home now then when she started.  She has found that the PLB is making a big difference for her.   Expected Outcomes Short: Continue to work on weight loss and meet with nutrition.  Long: Continue to work on improving SOB.      ITP Comments:     ITP Comments    Row Name 06/23/17 1316 07/06/17 0841 07/24/17 1054 08/03/17  [id number] 08/05/17 0928   ITP Comments Medical Review completed. Documentation for diagnosis can be found in [facility] encounter date 06/03/17 30 day review completed ITP sent to Dr. Ramonita Lab for Dr. Emily Filbert Director of [place]. Continue with ITP unless changes are made by physician.  Erin [name] reports her doctor said for her not to do the treadmill any more during Pulmonary Rehab. Erin [name] reports that she feels much better walking around our track in our gym instead of walking on the treadmill since she can adjust her walking easier.  30 day review completed. ITP sent to Dr. Emily Filbert Director of [place]. Continue with ITP unless changes are made by physician.   Erin [name] called to inform us that her doctor said it was ok to resume her exercise next Wednesday. She had fluid drained from her knee. Her doctor told her to do what she can and if her back pain flares up to take it easy.   [place] Name 08/05/17  [id number] 08/12/17 1216         ITP Comments Robertha has not attended since 8/17.  She has spoken with Wille Glaser and plans to return next week. Adrianna has had shots in her knees and has had back trouble due to her RA.  She wants to discharge from [facility] at this point but hopes to return to [name] when she is able.  She really enjoyed LW.           Comments: discharge ITP

## 2017-08-19 ENCOUNTER — Ambulatory Visit
Admission: RE | Admit: 2017-08-19 | Discharge: 2017-08-19 | Disposition: A | Discharge status: Home / Self Care | Payer: Medicare Other | Source: Ambulatory Visit | Attending: Internal Medicine | Admitting: Internal Medicine

## 2017-08-19 DIAGNOSIS — Z1231 Encounter for screening mammogram for malignant neoplasm of breast: Secondary | ICD-10-CM | POA: Diagnosis not present

## 2017-09-01 NOTE — Progress Notes (Signed)
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 female s/p TAH who is seen in consultation from Dr. Kenton [name] for Erin [name]. Erin [name] initially referred Erin [name] to Dr. Kenton [name].    At her last visit on 01/28/2017 she had vulvar colposcopy and biopsy that demonstrated  DIAGNOSIS:  A. VULVA, LEFT, 5:00:  - VULVAR TISSUE WITH SCANT INFLAMMATION.  - NEGATIVE FOR DYSPLASIA AND MALIGNANCY.  - MULTIPLE DEEPER LEVELS WERE EXAMINED.  Since then she has been using Lanacane to Erin area which helps with her symptoms. She says she feels a bump and that it comes and goes.    Gynecologic Oncology History Erin [name] is a pleasant female s/p TAH who is seen in consultation from Dr. Kenton [name] for [place]. Erin [name] initially referred Erin [name] to Dr. Kenton [name].   Erin [name] underwent left leg wide excision with sentinel node biopsy for melanoma and excision left labial nodule on 11/19/2016. Based on pathology report Erin vulvar nodule was 1 x 0.8 cm and Erin entire specimen removed was 1.5 x 0.9 x 0.3 cm. She complains of persistent irritation/itching and has been using clobesetrol ointment for years.    Data Reviewed DIAGNOSIS:  A. SKIN AND SOFT TISSUE, LEFT THIGH; EXCISION:  - NEGATIVE FOR RESIDUAL MELANOMA.  - BIOPSY SITE CHANGES.  - ALL MARGINS APPEAR CLEAR.   B. SENTINEL LYMPH NODE #1, LEFT INGUINAL; EXCISION:  - NEGATIVE FOR MALIGNANCY BY IMMUNOHISTOCHEMISTRY FOR SOX10.   C. LEFT LABIAL NODULE; EXCISION:  - HIGH-GRADE SQUAMOUS INTRAEPITHELIAL LESION (HSIL, VIN 3).   Comment:  In part C, there is HSIL 0.5 mm from a lateral margin. HSIL is present  in one tip section, so this margin is very close also. Complete  gynecologic exam for other HPV-associated lesions and close follow-up  are advised.  She recently had colonoscopy and EGD on 01/20/2017;  pathology colonic polyps and GE junction lesion inflamed granulation tissue Barrett's esophagus can not be excluded. Noted to have esophageal lesion and endoscopic ultrasound ordered on 01/29/2017.  Of note she does not have a history of abnormal Pap, genital warts, or other gynecologic related issues.   Problem List: [name] Active Problem List   Diagnosis Date Noted  . Vulvar irritation 09/02/2017  . 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 Screening for cardiovascular condition 12/15/2016  . Shortness of breath 12/15/2016  . Idiopathic interstitial fibrosis of lung syndrome (Erin [name]) 12/15/2016  . Scleroderma ([place]) 12/15/2016  . Raynaud's disease without gangrene 12/15/2016  . Melanoma of skin ([place]) 11/11/2016  . Lesion of labia 11/11/2016    Past Medical History: Past Medical History:  Diagnosis Date  . Anemia    h/o  . Cancer ([place])    melanoma x 2 Erin last one was of her leg and surgeon removed it  . Coronary artery disease   . DDD (degenerative disc disease), lumbar   . Dysphagia   . Dyspnea    with exertion-unable to walk a mile without getting sob- dr sparks set pt up to see Dr Rockey Situ after 11-19-16 surgery  . Esophageal dysmotility   . Fibrocystic breast disease   . GERD (gastroesophageal reflux disease)   . Heart murmur    h/o   . Hyperlipidemia   . Hypertension   . Mitral regurgitation   .  Osteopenia   . Pulmonary fibrosis ([place])    per Dr Erin [name]  . RA (rheumatoid arthritis) ([place])   . Raynaud's disease   . Scleroderma ([place])   . Vulvar dysplasia     Past Surgical History: Past Surgical History:  Procedure Laterality Date  . ABDOMINAL HYSTERECTOMY    . BREAST CYST EXCISION Left 20+ years ago   No scar visible  . CARPAL TUNNEL RELEASE Bilateral   . COLONOSCOPY    . COLONOSCOPY WITH PROPOFOL N/A 01/20/2017   Procedure: COLONOSCOPY WITH PROPOFOL;  Surgeon: Erin Sails,  MD;  Location[facility] END[facility]COPY;  Service: Endoscopy;  Laterality: N/A;  . ESOPHAGOGASTRODUODENOSCOPY    . ESOPHAGOGASTRODUODENOSCOPY (EGD) WITH PROPOFOL N/A 01/20/2017   Procedure: ESOPHAGOGASTRODUODENOSCOPY (EGD) WITH PROPOFOL;  Surgeon: Erin Sails, MD;  Location: [facility] ENDOSCOPY;  Service: Endoscopy;  Laterality: N/A;  . EXCISION HYDRADENITIS LABIA Left 11/19/2016   Procedure: EXCISION LABIAL MASS;  Surgeon: Erin Bellow, MD;  Location: [facility] ORS;  Service: General;  Laterality: Left;  . EYE SURGERY Bilateral    cataracts  . HEEL SPUR EXCISION    . MASS EXCISION Left 11/19/2016   Procedure: EXCISION LEFT THIGH MELANOMA;  Surgeon: Erin Bellow, MD;  Location: [facility] ORS;  Service: General;  Laterality: Left;  . SENTINEL NODE BIOPSY Left 11/19/2016   Procedure: INGUINAL SENTINEL NODE BIOPSY;  Surgeon: Erin Bellow, MD;  Location: [facility] ORS;  Service: General;  Laterality: Left;  . UPPER ESOPHAGEAL ENDOSCOPIC ULTRASOUND (EUS) N/A 01/29/2017   Procedure: UPPER ESOPHAGEAL ENDOSCOPIC ULTRASOUND (EUS);  Surgeon: Erin Bodily, MD;  Location: [facility] ENDOSCOPY;  Service: Endoscopy;  Laterality: N/A;    Past Gynecologic History:  As per HPI  OB History:  OB History  Gravida Para Term Preterm AB Living  3 3          SAB TAB Ectopic Multiple Live Births               # Outcome Date GA Lbr Len/2nd Weight Sex Delivery Anes PTL Lv  3 Para           2 Para           1 Para               Family History: Family History  Problem Relation Age of Onset  . Endometrial cancer Mother 72  . Bladder Cancer Father   . Cerebral palsy Sister     Social History: Social History   Social History  . Marital status: Widowed    Spouse name: N/A  . Number of children: N/A  . Years of education: N/A   Occupational History  . retired    Social History Main Topics  . Smoking status: Never Smoker  . Smokeless tobacco: Never Used  . Alcohol use No  . Drug use: No  . Sexual  activity: Not on file   Other Topics Concern  . Not on file   Social History Narrative  . No narrative on file    Allergies: Allergies  Allergen Reactions  . Remicade [Infliximab] Shortness Of Breath and Itching  . Sulfa Antibiotics Other (See Comments)    Other Reaction: tounge cracked  . Actonel [Risedronate Sodium] Other (See Comments)  . Fosamax [Alendronate Sodium]     GI Bleed   . Procardia [Nifedipine] Hives  . Sulfur Swelling  . Wellbutrin [Bupropion] Anxiety    Current Medications: Current Outpatient Prescriptions  Medication Sig Dispense Refill  . ALPRAZolam Duanne Moron)  0.5 MG tablet Take 0.5mg s twice daily as needed for anxiety    . amLODipine (NORVASC) 5 MG tablet 5 mg orally bid    . aspirin EC 81 MG tablet Take 81 mg by mouth at bedtime.     . cetirizine (ZYRTEC) 10 MG tablet Take 10 mg by mouth daily as needed for allergies.    . Clobetasol Prop Emollient Base (CLOBETASOL PROPIONATE E) 0.05 % emollient cream Apply 1 application topically 2 (two) times daily as needed (sores).     Marland Kitchen dexlansoprazole (DEXILANT) 60 MG capsule Take 60 mg by mouth at bedtime.     Marland Kitchen ezetimibe (ZETIA) 10 MG tablet Take 1 tablet (10 mg total) by mouth daily. 30 tablet 11  . FLUoxetine (PROZAC) 20 MG capsule 60 mg by mouth every morning    . folic acid (FOLVITE) 638 MCG tablet Take 400 mcg by mouth daily.     . furosemide (LASIX) 20 MG tablet Take 20mg s daily as needed for swelling    . gabapentin (NEURONTIN) 100 MG capsule Take 100 mg by mouth 2 (two) times daily.     . hydroxychloroquine (PLAQUENIL) 200 MG tablet Take 200 mg by mouth 2 (two) times daily.     Marland Kitchen latanoprost (XALATAN) 0.005 % ophthalmic solution Place 1 drop into both eyes at bedtime.     . lidocaine (XYLOCAINE) 2 % jelly Apply 1 application topically as needed. Apply to affected area 4 times daily as needed 30 mL 0  . Multiple Vitamin (MULTI-VITAMINS) TABS Take 1 tablet by mouth daily.     . Omega-3 Fatty Acids (FISH OIL  PO) Take 1 capsule by mouth daily.     . predniSONE (DELTASONE) 5 MG tablet Take 5 mg by mouth 2 (two) times daily with a meal.     . simvastatin (ZOCOR) 80 MG tablet Take 80mg s orally at bedtime    . zolpidem (AMBIEN) 10 MG tablet Take 10 mg by mouth at bedtime as needed for sleep.      No current facility-administered medications for this visit.     Review of Systems General: fatigue  HEENT: no complaints  Lungs: SOB  Cardiac: no complaints  GI: no complaints  GU: bladder function, vulvar irritation  Musculoskeletal: back pain  Extremities: no complaints  Skin: no complaints  Neuro: no complaints  Endocrine: no complaints  Psych: no complaints       Objective:  Physical Examination:  BP 120/68   Pulse 83   Temp 98.4 F (36.9 C) (Tympanic)   Resp 20   Ht 5\' 3"  (1.6 m)   Wt 190 lb 4.8 oz (86.3 kg)   BMI 33.71 kg/m    ECOG Performance Status: 1 - Symptomatic but completely ambulatory  General appearance: alert, cooperative and appears stated age HEENT:PERRLA, extra ocular movement intact and sclera clear, anicteric Lymph node survey: non-palpable, inguinal Abdomen: soft, non-tender, without masses or organomegaly, no hernias and well healed incision Extremities: extremities normal, atraumatic, no cyanosis or edema, well healed incision Neurological exam reveals alert, oriented, normal speech, no focal findings or movement disorder noted.  Pelvic: exam chaperoned by nurse;  Vulva: normal appearing vulva with no masses, tenderness or lesions, well healed 3 cm oblique incision of Erin left vulva; Vagina: normal vagina; Adnexa: no masses; Uterus: surgically absent, vaginal cuff well healed; Cervix: surgically absent; Rectal: not indicated  Vulvar colposcopy  Colposcopy was performed after application of acetic acid. No AWE lesions noted. She pointed to Erin area that bothers  her and it is right along Erin scar line. There were no other abnormalities at this site and biopsy  was not performed.    Lab Review Labs on site today: n/a  Radiologic Imaging: n/a    Assessment:  KERON KOFFMAN is a 71 y.o. female diagnosed with vulvar dysplasia (VIN3) s/p excisional biopsy with close, but negative, margins. Exam negative but given complaints of persistent irritation/itching and close margins vulvar biopsy was performed. Biopsy was negative for VIN, inflammation noted and probably etiology of vulvar symptoms. Colposcopy today reassuring.   Medical co-morbidities complicating care: HTN, [name], GERD, h/o melanoma.  Plan:   Problem List Items Addressed This Visit      Other   Vulvar irritation - Primary      She will continue Lanacane prn and precautions given. Return to clinic in  6 months.   Erin [name]'s diagnosis, an outline of Erin further diagnostic and laboratory studies which will be required, Erin recommendation, and alternatives were discussed.  All questions were answered to Erin [name]'s satisfaction.  A total of 30 minutes were spent with Erin [name]/family today; 50% was spent in education, counseling and coordination of care for VIN3.    Gillis Ends, MD    CC:  Referring Providers: Erin Applebaum, MD Erin Ard, MD   PCP: Idelle Crouch, MD [facility] [place], [place] [address] [phone]

## 2017-09-02 ENCOUNTER — Inpatient Hospital Stay: Payer: Medicare Other | Attending: Obstetrics and Gynecology | Admitting: Obstetrics and Gynecology

## 2017-09-02 ENCOUNTER — Encounter: Payer: Self-pay | Admitting: Obstetrics and Gynecology

## 2017-09-02 VITALS — BP 120/68 | HR 83 | Temp 98.4°F | Resp 20 | Ht 63.0 in | Wt 190.3 lb

## 2017-09-02 DIAGNOSIS — I10 Essential (primary) hypertension: Secondary | ICD-10-CM | POA: Insufficient documentation

## 2017-09-02 DIAGNOSIS — E785 Hyperlipidemia, unspecified: Secondary | ICD-10-CM | POA: Insufficient documentation

## 2017-09-02 DIAGNOSIS — M349 Systemic sclerosis, unspecified: Secondary | ICD-10-CM

## 2017-09-02 DIAGNOSIS — J8410 Pulmonary fibrosis, unspecified: Secondary | ICD-10-CM

## 2017-09-02 DIAGNOSIS — N903 Dysplasia of vulva, unspecified: Secondary | ICD-10-CM | POA: Diagnosis present

## 2017-09-02 DIAGNOSIS — A630 Anogenital (venereal) warts: Secondary | ICD-10-CM

## 2017-09-02 DIAGNOSIS — Z79899 Other long term (current) drug therapy: Secondary | ICD-10-CM | POA: Diagnosis not present

## 2017-09-02 DIAGNOSIS — K219 Gastro-esophageal reflux disease without esophagitis: Secondary | ICD-10-CM | POA: Diagnosis not present

## 2017-09-02 DIAGNOSIS — N9089 Other specified noninflammatory disorders of vulva and perineum: Secondary | ICD-10-CM

## 2017-09-02 DIAGNOSIS — Z7982 Long term (current) use of aspirin: Secondary | ICD-10-CM | POA: Insufficient documentation

## 2017-09-02 DIAGNOSIS — M069 Rheumatoid arthritis, unspecified: Secondary | ICD-10-CM | POA: Insufficient documentation

## 2017-09-02 DIAGNOSIS — M8580 Other specified disorders of bone density and structure, unspecified site: Secondary | ICD-10-CM

## 2017-09-02 DIAGNOSIS — M5136 Other intervertebral disc degeneration, lumbar region: Secondary | ICD-10-CM

## 2017-09-02 DIAGNOSIS — Z85820 Personal history of malignant melanoma of skin: Secondary | ICD-10-CM | POA: Diagnosis not present

## 2017-09-02 DIAGNOSIS — I25119 Atherosclerotic heart disease of native coronary artery with unspecified angina pectoris: Secondary | ICD-10-CM | POA: Diagnosis not present

## 2017-09-02 DIAGNOSIS — Z8544 Personal history of malignant neoplasm of other female genital organs: Secondary | ICD-10-CM | POA: Insufficient documentation

## 2017-09-02 DIAGNOSIS — I340 Nonrheumatic mitral (valve) insufficiency: Secondary | ICD-10-CM | POA: Diagnosis not present

## 2017-09-02 NOTE — Progress Notes (Signed)
Pt has some urinary incontenence and started a medication for it. Tired and sob from her scleroderma. Off and on with vulvar irritation.

## 2017-09-21 ENCOUNTER — Other Ambulatory Visit: Payer: Self-pay | Admitting: Internal Medicine

## 2017-09-21 DIAGNOSIS — R251 Tremor, unspecified: Secondary | ICD-10-CM

## 2017-09-29 ENCOUNTER — Ambulatory Visit: Payer: Medicare Other

## 2017-09-29 ENCOUNTER — Ambulatory Visit
Admission: RE | Admit: 2017-09-29 | Discharge: 2017-09-29 | Disposition: A | Discharge status: Home / Self Care | Payer: Medicare Other | Source: Ambulatory Visit | Attending: Internal Medicine | Admitting: Internal Medicine

## 2017-09-29 DIAGNOSIS — R9082 White matter disease, unspecified: Secondary | ICD-10-CM | POA: Diagnosis not present

## 2017-09-29 DIAGNOSIS — R251 Tremor, unspecified: Secondary | ICD-10-CM

## 2017-09-29 DIAGNOSIS — Z8673 Personal history of transient ischemic attack (TIA), and cerebral infarction without residual deficits: Secondary | ICD-10-CM | POA: Insufficient documentation

## 2017-09-29 DIAGNOSIS — G311 Senile degeneration of brain, not elsewhere classified: Secondary | ICD-10-CM | POA: Diagnosis not present

## 2017-09-29 DIAGNOSIS — R270 Ataxia, unspecified: Secondary | ICD-10-CM | POA: Diagnosis present

## 2017-10-16 ENCOUNTER — Encounter: Payer: Self-pay | Admitting: Pulmonary Disease

## 2017-10-16 ENCOUNTER — Ambulatory Visit (INDEPENDENT_AMBULATORY_CARE_PROVIDER_SITE_OTHER): Payer: Medicare Other | Admitting: Pulmonary Disease

## 2017-10-16 VITALS — BP 128/80 | HR 60 | Ht 63.0 in | Wt 188.0 lb

## 2017-10-16 DIAGNOSIS — J8410 Pulmonary fibrosis, unspecified: Secondary | ICD-10-CM | POA: Diagnosis not present

## 2017-10-16 DIAGNOSIS — R0609 Other forms of dyspnea: Secondary | ICD-10-CM

## 2017-10-16 DIAGNOSIS — I951 Orthostatic hypotension: Secondary | ICD-10-CM | POA: Diagnosis not present

## 2017-10-16 DIAGNOSIS — M349 Systemic sclerosis, unspecified: Secondary | ICD-10-CM

## 2017-10-16 DIAGNOSIS — I25119 Atherosclerotic heart disease of native coronary artery with unspecified angina pectoris: Secondary | ICD-10-CM | POA: Diagnosis not present

## 2017-10-16 NOTE — Patient Instructions (Signed)
I will discuss with Dr. Doy Hutching and Dr. Jefm Bryant your symptoms of lightheadedness with standing.  One of them should contact you to discuss any possible changes in your medications  Follow-up with [place] in 9 months with lung function tests and CXR prior to that visit

## 2017-10-16 NOTE — Progress Notes (Signed)
PULMONARY OFFICE FOLLOW UP  NOTE  Requesting MD/Service: Gollan. Primary MD: Doy Hutching. RheumJefm Bryant Date of initial consultation: 02/23/17 Reason for consultation: Scleroderma, pulmonary fibrosis, progressive DOE  PT PROFILE: 71 y.o. 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 Previously followed by Dr Raul Del  DATA: PFTs 2015: No obstruction, TLC 90%, DLCO 72% CT chest 10/01/15: Dilated esophagus consistent with the given history of scleroderma. This is also stable from the most recent prior CT. Lungs show changes of interstitial fibrosis, likely usual interstitial fibrosis, predominantly in the lower lungs, similar to the most recent prior CT CXR 11/18/16: Chronic interstitial changes are present at the lung bases 6MWT 03/04/17: 168 m, no desaturation. Patient stated that she felt like she couldn't get any air and started wheezing. Echocardiogram 03/31/17: Mild concentric LV hypertrophy with LVEF 55-60%. No evidence of pulmonary hypertension. PFTs 06/02/17 FVC: 2.46 L (97 %pred), FEV1: 1.83 L ( 91 %pred), FEV1/FVC: 74% , TLC:  4.06 L ( 97 %pred), DLCO 54%pred CXR 06/02/17: Slight progression in pulmonary fibrosis particularly in the upper lobes when compared with the prior exam   SUBJ:  This is a routine reevaluation.  Last visit, we enrolled her the pulmonary rehabilitation program.  She participated in this enthusiastically until she developed severe back pain.  She has subsequently had an MRI of her spine performed and has several herniated disks.  Therefore, she has had to discontinue the pulmonary rehabilitation program.  She is troubled by severe back pain.  She also reports consistent symptoms of orthostasis.  She denies true vertigo.  Denies CP, fever, purulent sputum, hemoptysis, LE edema and calf tenderness    Vitals:   10/16/17 1345 10/16/17 1348  BP:  128/80  Pulse:  60  SpO2:  99%   Weight: 85.3 kg (188 lb)   Height: 5\' 3"  (1.6 m)   RA  EXAM:  Gen: NAD at rest HEENT: WNL Neck: Supple without JVD Lungs: minimal bibasilar crackles, no wheezes Cardiovascular: RReg, no murmurs Abdomen: Soft, nontender, normal BS Ext: Skin and digital changes consistent with scleroderma Neuro: CNs grossly intact, motor and sensory intact Skin: innumerable telangiectasias on face and torso  DATA:   BMP Latest Ref Rng & Units 04/06/2012  Creatinine 0.60 - 1.30 mg/dL 0.79    No flowsheet data found.  CXR:  NNF.  IMPRESSION:     ICD-10-CM   1. 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 Exertional dyspnea -not adequately explained by objective findings R06.09   3. Orthostasis - note: on amlodipine I95.1   4. Pulmonary fibrosis (HCC) J84.10    Suspect that orthostatic symptoms are due to amlodipine which she is on for Raynaud's phenomenon. Will defer to Dr Doy Hutching or Dr Jefm Bryant whether this can be reduced or changed to once a day   PLAN:  Will need annual CXR and PFTs to track course of pulmonary fibrosis ROV in 8-9 months with repeat CXR and PFTs  Merton Border, MD PCCM service Mobile [phone] Pager [phone] 10/16/2017

## 2017-10-24 DIAGNOSIS — I25118 Atherosclerotic heart disease of native coronary artery with other forms of angina pectoris: Secondary | ICD-10-CM | POA: Insufficient documentation

## 2017-10-24 NOTE — Progress Notes (Signed)
Cardiology Office Note  Date:  10/26/2017   ID:  Erin [name], DOB 07-18-1946, MRN [id number]  PCP:  Idelle Crouch, MD   Chief Complaint  Patient presents with  . other    6 month follow up. Patient c/o SOB. meds reviewed verbally with patient.     HPI:  Erin [name] is a very pleasant 71 year old woman with history of  [name], Seen on CT scan chest October 2016 mid to distal LAD region Scleroderma,  raynauds disease (toes, hands), arthritis,  chronic interstitial lung disease at the bases seen on CT scan chest x-ray,  who presents for worsening shortness of breath on exertion  Stress test 12/19/2016 No ischemia, EF >55%  Echo 03/2017 Normal ejection fraction  Spinal stenosis Disk problems Walking with a cane and walker Was told not a candidate for surgery,  Stopped the morning amlodipine Takes one in the PM  Stopped rehab, had a fall, Broken ribs  No pulmonary issues managed by Dr. Hinda Glatter her shortness of breath is stable  Previous CXRshowing chronic interstitial changes are present at the lung bases.  CT scan results and images  mid to distal LAD coronary calcifications No significant thoracic descending  or ascending aortic atherosclerosis It does show fibrosis at the bases of the lungs bilaterally perhaps worse on the left than the right  Other medical issues include Melanoma left thigh with  EKG on today's visit shows normal sinus rhythm with rate 79 bpm, no significant ST or T-wave changes  Orthostatics done in the office shows no significant change in blood pressure with standing, there is change in heart rate from 69 beats minute supine up to 86 bpm standing  PMH:   has a past medical history of Anemia, Cancer ([place]), Coronary artery disease, DDD (degenerative disc disease), lumbar, Dysphagia, Dyspnea, Esophageal dysmotility, Fibrocystic breast disease, GERD (gastroesophageal reflux disease), Heart murmur, Hyperlipidemia, Hypertension, Mitral  regurgitation, Osteopenia, Pulmonary fibrosis (HCC), RA (rheumatoid arthritis) ([place]), Raynaud's disease, Scleroderma ([place]), and Vulvar dysplasia.  PSH:    Past Surgical History:  Procedure Laterality Date  . ABDOMINAL HYSTERECTOMY    . BREAST CYST EXCISION Left 20+ years ago   No scar visible  . CARPAL TUNNEL RELEASE Bilateral   . COLONOSCOPY    . COLONOSCOPY WITH PROPOFOL N/A 01/20/2017   Performed by Lollie Sails, MD at [place]  . ESOPHAGOGASTRODUODENOSCOPY    . ESOPHAGOGASTRODUODENOSCOPY (EGD) WITH PROPOFOL N/A 01/20/2017   Performed by Lollie Sails, MD at [place]  . EXCISION LABIAL MASS Left 11/19/2016   Performed by Robert Bellow, MD at [facility] ORS  . EXCISION LEFT THIGH MELANOMA Left 11/19/2016   Performed by Robert Bellow, MD at [facility] ORS  . EYE SURGERY Bilateral    cataracts  . HEEL SPUR EXCISION    . INGUINAL SENTINEL NODE BIOPSY Left 11/19/2016   Performed by Robert Bellow, MD at [facility] ORS  . UPPER ESOPHAGEAL ENDOSCOPIC ULTRASOUND (EUS) N/A 01/29/2017   Performed by Holly Bodily, MD at [facility] ENDOSCOPY    Current Outpatient Medications  Medication Sig Dispense Refill  . ALPRAZolam (XANAX) 0.5 MG tablet Take 0.5mg s twice daily as needed for anxiety    . amLODipine (NORVASC) 5 MG tablet 5 mg orally bid    . aspirin EC 81 MG tablet Take 81 mg by mouth at bedtime.     . cetirizine (ZYRTEC) 10 MG tablet Take 10 mg by mouth daily as needed for allergies.    Marland Kitchen  Clobetasol Prop Emollient Base (CLOBETASOL PROPIONATE E) 0.05 % emollient cream Apply 1 application topically 2 (two) times daily as needed (sores).     Marland Kitchen dexlansoprazole (DEXILANT) 60 MG capsule Take 60 mg by mouth at bedtime.     Marland Kitchen ezetimibe (ZETIA) 10 MG tablet Take 1 tablet (10 mg total) by mouth daily. 30 tablet 11  . FLUoxetine (PROZAC) 20 MG capsule 60 mg by mouth every morning    . folic acid (FOLVITE) 829 MCG tablet Take 400 mcg by mouth daily.     . furosemide (LASIX) 20  MG tablet Take 20mg s daily as needed for swelling    . gabapentin (NEURONTIN) 100 MG capsule Take 100 mg by mouth 2 (two) times daily.     . hydroxychloroquine (PLAQUENIL) 200 MG tablet Take 200 mg by mouth 2 (two) times daily.     Marland Kitchen latanoprost (XALATAN) 0.005 % ophthalmic solution Place 1 drop into both eyes at bedtime.     . Multiple Vitamin (MULTI-VITAMINS) TABS Take 1 tablet by mouth daily.     . Omega-3 Fatty Acids (FISH OIL PO) Take 1 capsule by mouth daily.     . predniSONE (DELTASONE) 5 MG tablet Take 5 mg by mouth 2 (two) times daily with a meal.     . simvastatin (ZOCOR) 80 MG tablet Take 80mg s orally at bedtime    . zolpidem (AMBIEN) 10 MG tablet Take 10 mg by mouth at bedtime as needed for sleep.      No current facility-administered medications for this visit.      Allergies:   Remicade [infliximab]; Sulfa antibiotics; Actonel [risedronate sodium]; Fosamax [alendronate sodium]; Procardia [nifedipine]; Sulfur; and Wellbutrin [bupropion]   Social History:  The patient  reports that  has never smoked. she has never used smokeless tobacco. She reports that she does not drink alcohol or use drugs.   Family History:   family history includes Bladder Cancer in her father; Cerebral palsy in her sister; Endometrial cancer (age of onset: 8) in her mother.    Review of Systems: Review of Systems  Respiratory: Positive for shortness of breath.   Cardiovascular: Negative.   Gastrointestinal: Negative.   Musculoskeletal: Negative.   Neurological: Positive for weakness.  Psychiatric/Behavioral: Negative.   All other systems reviewed and are negative.    PHYSICAL EXAM: VS:  BP (!) 144/76 (BP Location: Left Arm, Patient Position: Sitting, Cuff Size: Large)   Pulse 79   Ht 5\' 2"  (1.575 m)   Wt 189 lb (85.7 kg)   BMI 34.57 kg/m  , BMI Body mass index is 34.57 kg/m. GEN: Well nourished, well developed, in no acute distress , obese, walks with a cane HEENT: normal  Neck: no JVD,  carotid bruits, or masses Cardiac: RRR; no murmurs, rubs, or gallops,no edema  Respiratory:  clear to auscultation bilaterally, normal work of breathing GI: soft, nontender, nondistended, + BS MS: no deformity or atrophy  Skin: warm and dry, no rash Neuro:  Strength and sensation are intact Psych: euthymic mood, full affect    Recent Labs: No results found for requested labs within last 8760 hours.    Lipid Panel No results found for: CHOL, HDL, LDLCALC, TRIG    Wt Readings from Last 3 Encounters:  10/26/17 189 lb (85.7 kg)  10/16/17 188 lb (85.3 kg)  09/02/17 190 lb 4.8 oz (86.3 kg)      ASSESSMENT AND PLAN:   Shortness of breath Normal stress test, echocardiogram with normal ejection fraction Secondary  to obesity, deconditioning, interstitial fibrosis  Idiopathic interstitial fibrosis of lung syndrome (HCC) Followed by pulmonary, Dr. Jamal Collin Stable symptoms per the patient  Scleroderma [facility]) Followed by rheumatology She reports long history dating back to 2000  Raynaud's disease without gangrene Recommended she wear thick socks, continue calcium channel blockers  [name]  Seen on CT scan chest October 2016 mid to distal LAD region Recommended aggressive cholesterol management   Total encounter time more than 25 minutes  Greater than 50% was spent in counseling and coordination of care with the patient   Disposition:   F/U  12 months   Orders Placed This Encounter  Procedures  . EKG 12-Lead     Signed, Esmond Plants, M.D., Ph.D. 10/26/2017  [place], [place]

## 2017-10-26 ENCOUNTER — Ambulatory Visit (INDEPENDENT_AMBULATORY_CARE_PROVIDER_SITE_OTHER): Payer: Medicare Other | Admitting: Cardiovascular Disease

## 2017-10-26 ENCOUNTER — Encounter: Payer: Self-pay | Admitting: Cardiovascular Disease

## 2017-10-26 VITALS — BP 144/76 | HR 79 | Ht 62.0 in | Wt 189.0 lb

## 2017-10-26 DIAGNOSIS — E782 Mixed hyperlipidemia: Secondary | ICD-10-CM | POA: Diagnosis not present

## 2017-10-26 DIAGNOSIS — I25119 Atherosclerotic heart disease of native coronary artery with unspecified angina pectoris: Secondary | ICD-10-CM | POA: Diagnosis not present

## 2017-10-26 DIAGNOSIS — I7300 Raynaud's syndrome without gangrene: Secondary | ICD-10-CM | POA: Diagnosis not present

## 2017-10-26 DIAGNOSIS — I25118 Atherosclerotic heart disease of native coronary artery with other forms of angina pectoris: Secondary | ICD-10-CM | POA: Diagnosis not present

## 2017-10-26 DIAGNOSIS — E785 Hyperlipidemia, unspecified: Secondary | ICD-10-CM | POA: Insufficient documentation

## 2017-10-26 DIAGNOSIS — J84112 Idiopathic pulmonary fibrosis: Secondary | ICD-10-CM | POA: Diagnosis not present

## 2017-10-26 DIAGNOSIS — M349 Systemic sclerosis, unspecified: Secondary | ICD-10-CM

## 2017-10-26 DIAGNOSIS — I209 Angina pectoris, unspecified: Secondary | ICD-10-CM

## 2017-10-26 NOTE — Patient Instructions (Addendum)

## 2017-11-13 ENCOUNTER — Other Ambulatory Visit: Payer: Self-pay | Admitting: Cardiovascular Disease

## 2018-01-15 DIAGNOSIS — F5104 Psychophysiologic insomnia: Secondary | ICD-10-CM | POA: Insufficient documentation

## 2018-01-20 DIAGNOSIS — M5416 Radiculopathy, lumbar region: Secondary | ICD-10-CM | POA: Insufficient documentation

## 2018-01-20 DIAGNOSIS — G629 Polyneuropathy, unspecified: Secondary | ICD-10-CM | POA: Insufficient documentation

## 2018-02-12 ENCOUNTER — Telehealth: Payer: Self-pay | Admitting: *Deleted

## 2018-02-12 DIAGNOSIS — M349 Systemic sclerosis, unspecified: Secondary | ICD-10-CM

## 2018-02-12 DIAGNOSIS — J84112 Idiopathic pulmonary fibrosis: Secondary | ICD-10-CM

## 2018-02-12 DIAGNOSIS — J8410 Pulmonary fibrosis, unspecified: Secondary | ICD-10-CM

## 2018-02-12 NOTE — Telephone Encounter (Signed)
Received surgery clearance from surgeon for lumbar spine surgery. Patient last seen 10/2017 with return of 07/2018. Does she need sooner appt for clearance or can form be filled out?

## 2018-02-12 NOTE — Telephone Encounter (Signed)
Appt scheduled with DR 03/01/18. Pt aware CXR before appt. PFT ordered. Nothing further needed.

## 2018-02-12 NOTE — Telephone Encounter (Signed)
Please get her in with DK or DR next week with CXR and PFTs prior to visit  Thanks  Waunita Schooner

## 2018-02-22 ENCOUNTER — Emergency Department: Payer: Medicare Other

## 2018-02-22 ENCOUNTER — Other Ambulatory Visit: Payer: Self-pay

## 2018-02-22 ENCOUNTER — Inpatient Hospital Stay
Admission: EM | Admit: 2018-02-22 | Discharge: 2018-02-25 | DRG: 470 | Disposition: A | Discharge status: Skilled Nursing Facility | Payer: Medicare Other | Attending: Internal Medicine | Admitting: Internal Medicine

## 2018-02-22 DIAGNOSIS — K219 Gastro-esophageal reflux disease without esophagitis: Secondary | ICD-10-CM | POA: Diagnosis present

## 2018-02-22 DIAGNOSIS — S72012A Unspecified intracapsular fracture of left femur, initial encounter for closed fracture: Secondary | ICD-10-CM | POA: Diagnosis present

## 2018-02-22 DIAGNOSIS — W109XXA Fall (on) (from) unspecified stairs and steps, initial encounter: Secondary | ICD-10-CM | POA: Diagnosis present

## 2018-02-22 DIAGNOSIS — M349 Systemic sclerosis, unspecified: Secondary | ICD-10-CM | POA: Diagnosis present

## 2018-02-22 DIAGNOSIS — Y92009 Unspecified place in unspecified non-institutional (private) residence as the place of occurrence of the external cause: Secondary | ICD-10-CM | POA: Diagnosis not present

## 2018-02-22 DIAGNOSIS — I7300 Raynaud's syndrome without gangrene: Secondary | ICD-10-CM | POA: Diagnosis present

## 2018-02-22 DIAGNOSIS — M069 Rheumatoid arthritis, unspecified: Secondary | ICD-10-CM | POA: Diagnosis present

## 2018-02-22 DIAGNOSIS — J8410 Pulmonary fibrosis, unspecified: Secondary | ICD-10-CM | POA: Diagnosis present

## 2018-02-22 DIAGNOSIS — Z7952 Long term (current) use of systemic steroids: Secondary | ICD-10-CM

## 2018-02-22 DIAGNOSIS — I2510 Atherosclerotic heart disease of native coronary artery without angina pectoris: Secondary | ICD-10-CM | POA: Diagnosis present

## 2018-02-22 DIAGNOSIS — Z79899 Other long term (current) drug therapy: Secondary | ICD-10-CM

## 2018-02-22 DIAGNOSIS — I10 Essential (primary) hypertension: Secondary | ICD-10-CM | POA: Diagnosis present

## 2018-02-22 DIAGNOSIS — Z888 Allergy status to other drugs, medicaments and biological substances status: Secondary | ICD-10-CM | POA: Diagnosis not present

## 2018-02-22 DIAGNOSIS — Z882 Allergy status to sulfonamides status: Secondary | ICD-10-CM | POA: Diagnosis not present

## 2018-02-22 DIAGNOSIS — E785 Hyperlipidemia, unspecified: Secondary | ICD-10-CM | POA: Diagnosis present

## 2018-02-22 DIAGNOSIS — Z7982 Long term (current) use of aspirin: Secondary | ICD-10-CM | POA: Diagnosis not present

## 2018-02-22 DIAGNOSIS — J449 Chronic obstructive pulmonary disease, unspecified: Secondary | ICD-10-CM | POA: Diagnosis present

## 2018-02-22 DIAGNOSIS — S72009A Fracture of unspecified part of neck of unspecified femur, initial encounter for closed fracture: Secondary | ICD-10-CM | POA: Diagnosis present

## 2018-02-22 LAB — COMPREHENSIVE METABOLIC PANEL
ALK PHOS: 88 U/L (ref 38–126)
ALT: 29 U/L (ref 14–54)
AST: 32 U/L (ref 15–41)
Albumin: 3.4 g/dL — ABNORMAL LOW (ref 3.5–5.0)
Anion gap: 9 (ref 5–15)
BUN: 25 mg/dL — AB (ref 6–20)
CHLORIDE: 98 mmol/L — AB (ref 101–111)
CO2: 23 mmol/L (ref 22–32)
CREATININE: 0.92 mg/dL (ref 0.44–1.00)
Calcium: 8.2 mg/dL — ABNORMAL LOW (ref 8.9–10.3)
GFR calc Af Amer: >60 mL/min (ref >60)
Glucose, Bld: 112 mg/dL — ABNORMAL HIGH (ref 65–99)
Potassium: 4.3 mmol/L (ref 3.5–5.1)
Sodium: 130 mmol/L — ABNORMAL LOW (ref 135–145)
Total Bilirubin: 0.6 mg/dL (ref 0.3–1.2)
Total Protein: 6.4 g/dL — ABNORMAL LOW (ref 6.5–8.1)

## 2018-02-22 LAB — CBC WITH DIFFERENTIAL/PLATELET
Basophils Absolute: 0.1 10*3/uL (ref 0–0.1)
Basophils Relative: 1 %
EOS PCT: 0 %
Eosinophils Absolute: 0 10*3/uL (ref 0–0.7)
HCT: 33.8 % — ABNORMAL LOW (ref 35.0–47.0)
Hemoglobin: 10.7 g/dL — ABNORMAL LOW (ref 12.0–16.0)
LYMPHS ABS: 0.8 10*3/uL — AB (ref 1.0–3.6)
LYMPHS PCT: 8 %
MCH: 26.4 pg (ref 26.0–34.0)
MCHC: 31.6 g/dL — AB (ref 32.0–36.0)
MCV: 83.7 fL (ref 80.0–100.0)
Monocytes Absolute: 0.8 10*3/uL (ref 0.2–0.9)
Monocytes Relative: 7 %
Neutro Abs: 9.4 10*3/uL — ABNORMAL HIGH (ref 1.4–6.5)
Neutrophils Relative %: 84 %
PLATELETS: 291 10*3/uL (ref 150–440)
RBC: 4.05 MIL/uL (ref 3.80–5.20)
RDW: 17 % — ABNORMAL HIGH (ref 11.5–14.5)
WBC: 11.1 10*3/uL — ABNORMAL HIGH (ref 3.6–11.0)

## 2018-02-22 LAB — TYPE AND SCREEN
ABO/RH(D): O POS
Antibody Screen: NEGATIVE

## 2018-02-22 LAB — PROTIME-INR
INR: 0.89
Prothrombin Time: 12 seconds (ref 11.4–15.2)

## 2018-02-22 LAB — TROPONIN I: Troponin I: <0.03 ng/mL (ref <0.03)

## 2018-02-22 MED ORDER — OXYCODONE HCL 5 MG PO TABS
5.0000 mg | ORAL_TABLET | ORAL | Status: DC | PRN
  Administered 2018-02-22 – 2018-02-23 (×3): 5 mg via ORAL
  Filled 2018-02-22 (×3): qty 1

## 2018-02-22 MED ORDER — FLUOXETINE HCL 20 MG PO CAPS
20.0000 mg | ORAL_CAPSULE | Freq: Every day | ORAL | Status: DC
  Administered 2018-02-24 – 2018-02-25 (×2): 20 mg via ORAL
  Filled 2018-02-22 (×3): qty 1

## 2018-02-22 MED ORDER — ACETAMINOPHEN 325 MG PO TABS
650.0000 mg | ORAL_TABLET | Freq: Four times a day (QID) | ORAL | Status: DC | PRN
  Administered 2018-02-23: 650 mg via ORAL
  Filled 2018-02-22: qty 2

## 2018-02-22 MED ORDER — ALPRAZOLAM 0.5 MG PO TABS
0.5000 mg | ORAL_TABLET | Freq: Four times a day (QID) | ORAL | Status: DC
  Administered 2018-02-22 – 2018-02-25 (×9): 0.5 mg via ORAL
  Filled 2018-02-22 (×9): qty 1

## 2018-02-22 MED ORDER — CEFAZOLIN SODIUM-DEXTROSE 2-4 GM/100ML-% IV SOLN
2.0000 g | INTRAVENOUS | Status: AC
  Administered 2018-02-23: 2 g via INTRAVENOUS
  Filled 2018-02-22 (×2): qty 100

## 2018-02-22 MED ORDER — PREDNISONE 10 MG PO TABS
5.0000 mg | ORAL_TABLET | Freq: Two times a day (BID) | ORAL | Status: DC
  Administered 2018-02-23 – 2018-02-25 (×4): 5 mg via ORAL
  Filled 2018-02-22 (×3): qty 1

## 2018-02-22 MED ORDER — ZOLPIDEM TARTRATE 5 MG PO TABS
5.0000 mg | ORAL_TABLET | Freq: Every evening | ORAL | Status: DC | PRN
  Administered 2018-02-22: 5 mg via ORAL
  Filled 2018-02-22: qty 1

## 2018-02-22 MED ORDER — SODIUM CHLORIDE 0.9 % IV SOLN
INTRAVENOUS | Status: DC
  Administered 2018-02-22: 19:00:00 via INTRAVENOUS
  Administered 2018-02-23: 75 mL/h via INTRAVENOUS

## 2018-02-22 MED ORDER — LATANOPROST 0.005 % OP SOLN
1.0000 [drp] | Freq: Every day | OPHTHALMIC | Status: DC
  Administered 2018-02-22 – 2018-02-24 (×3): 1 [drp] via OPHTHALMIC
  Filled 2018-02-22: qty 2.5

## 2018-02-22 MED ORDER — AMLODIPINE BESYLATE 10 MG PO TABS
10.0000 mg | ORAL_TABLET | Freq: Every day | ORAL | Status: DC
  Administered 2018-02-24 – 2018-02-25 (×2): 10 mg via ORAL
  Filled 2018-02-22: qty 1

## 2018-02-22 MED ORDER — ACETAMINOPHEN 650 MG RE SUPP: 650.0000 mg | Freq: Four times a day (QID) | RECTAL | Status: DC | PRN

## 2018-02-22 MED ORDER — IMIPRAMINE HCL 25 MG PO TABS
25.0000 mg | ORAL_TABLET | Freq: Two times a day (BID) | ORAL | Status: DC
  Administered 2018-02-22 – 2018-02-25 (×5): 25 mg via ORAL
  Filled 2018-02-22 (×7): qty 1

## 2018-02-22 MED ORDER — EZETIMIBE 10 MG PO TABS
10.0000 mg | ORAL_TABLET | Freq: Every day | ORAL | Status: DC
  Administered 2018-02-24 – 2018-02-25 (×2): 10 mg via ORAL
  Filled 2018-02-22 (×3): qty 1

## 2018-02-22 MED ORDER — FENTANYL CITRATE (PF) 100 MCG/2ML IJ SOLN
75.0000 ug | Freq: Once | INTRAMUSCULAR | Status: AC
  Administered 2018-02-22: 75 ug via INTRAVENOUS
  Filled 2018-02-22: qty 2

## 2018-02-22 MED ORDER — ATORVASTATIN CALCIUM 20 MG PO TABS
40.0000 mg | ORAL_TABLET | Freq: Every day | ORAL | Status: DC
  Administered 2018-02-23 – 2018-02-24 (×2): 40 mg via ORAL

## 2018-02-22 MED ORDER — ONDANSETRON HCL 4 MG/2ML IJ SOLN: 4.0000 mg | Freq: Four times a day (QID) | INTRAMUSCULAR | Status: DC | PRN

## 2018-02-22 MED ORDER — ADULT MULTIVITAMIN W/MINERALS CH
1.0000 | ORAL_TABLET | Freq: Every day | ORAL | Status: DC
  Administered 2018-02-24 – 2018-02-25 (×2): 1 via ORAL
  Filled 2018-02-22 (×3): qty 1

## 2018-02-22 MED ORDER — POLYETHYLENE GLYCOL 3350 17 G PO PACK
17.0000 g | PACK | Freq: Every day | ORAL | Status: DC
  Administered 2018-02-24 – 2018-02-25 (×2): 17 g via ORAL
  Filled 2018-02-22 (×2): qty 1

## 2018-02-22 MED ORDER — POLYETHYLENE GLYCOL 3350 17 G PO PACK: 17.0000 g | PACK | Freq: Every day | ORAL | Status: DC | PRN

## 2018-02-22 MED ORDER — HYDROXYCHLOROQUINE SULFATE 200 MG PO TABS
200.0000 mg | ORAL_TABLET | Freq: Two times a day (BID) | ORAL | Status: DC
  Administered 2018-02-22 – 2018-02-25 (×5): 200 mg via ORAL
  Filled 2018-02-22 (×5): qty 1

## 2018-02-22 MED ORDER — ONDANSETRON HCL 4 MG PO TABS: 4.0000 mg | ORAL_TABLET | Freq: Four times a day (QID) | ORAL | Status: DC | PRN

## 2018-02-22 MED ORDER — DOCUSATE SODIUM 100 MG PO CAPS
100.0000 mg | ORAL_CAPSULE | Freq: Two times a day (BID) | ORAL | Status: DC
  Administered 2018-02-22 – 2018-02-25 (×5): 100 mg via ORAL
  Filled 2018-02-22 (×5): qty 1

## 2018-02-22 MED ORDER — TRAMADOL HCL 50 MG PO TABS
50.0000 mg | ORAL_TABLET | Freq: Two times a day (BID) | ORAL | Status: DC | PRN
  Administered 2018-02-22: 50 mg via ORAL
  Filled 2018-02-22: qty 1

## 2018-02-22 MED ORDER — ALBUTEROL SULFATE (2.5 MG/3ML) 0.083% IN NEBU
2.5000 mg | INHALATION_SOLUTION | RESPIRATORY_TRACT | Status: DC | PRN
  Administered 2018-02-25: 2.5 mg via RESPIRATORY_TRACT
  Filled 2018-02-22: qty 3

## 2018-02-22 MED ORDER — OMEGA-3-ACID ETHYL ESTERS 1 G PO CAPS
1.0000 | ORAL_CAPSULE | Freq: Two times a day (BID) | ORAL | Status: DC
  Administered 2018-02-24 – 2018-02-25 (×3): 1 g via ORAL
  Filled 2018-02-22 (×7): qty 1

## 2018-02-22 MED ORDER — GABAPENTIN 300 MG PO CAPS
300.0000 mg | ORAL_CAPSULE | Freq: Three times a day (TID) | ORAL | Status: DC
  Administered 2018-02-22 – 2018-02-25 (×7): 300 mg via ORAL
  Filled 2018-02-22 (×7): qty 1

## 2018-02-22 NOTE — ED Notes (Signed)
ED TO INPATIENT HANDOFF REPORT  Name/Age/Gender Erin [name] 72 y.o. female  Code Status Code Status History    This patient does not have a recorded code status. Please follow your organizational policy for patients in this situation.    Advance Directive Documentation     Most Recent Value  Type of Advance Directive  Healthcare Power of Attorney, Out of facility DNR (pink MOST or yellow form), Living will  Pre-existing out of facility DNR order (yellow form or pink MOST form)  No data  "MOST" Form in Place?  No data      Home/SNF/Other Home  Chief Complaint fall/lt hip injury  Level of Care/Admitting Diagnosis ED Disposition    ED Disposition Condition [facility] Area: [place] [100120]  Level of Care: Med-Surg [16]  Diagnosis: Hip fracture [facility]) [[id number]]  Admitting Physician: Odessa Fleming  Attending Physician: Odessa Fleming  Estimated length of stay: past midnight tomorrow  Certification:: I certify this patient will need inpatient services for at least 2 midnights  Bed request comments: 1A  PT Class (Do Not Modify): Inpatient [101]  PT Acc Code (Do Not Modify): Private [1]       Medical History Past Medical History:  Diagnosis Date  . Anemia    h/o  . Cancer ([place])    melanoma x 2 the last one was of her leg and surgeon removed it  . Coronary artery disease   . DDD (degenerative disc disease), lumbar   . Dysphagia   . Dyspnea    with exertion-unable to walk a mile without getting sob- dr sparks set pt up to see Dr Rockey Situ after 11-19-16 surgery  . Esophageal dysmotility   . Fibrocystic breast disease   . GERD (gastroesophageal reflux disease)   . Heart murmur    h/o   . Hyperlipidemia   . Hypertension   . Mitral regurgitation   . Osteopenia   . Pulmonary fibrosis ([place])    per Dr Raul Del  . RA (rheumatoid arthritis) ([place])   . Raynaud's disease   . Scleroderma ([place])   . Vulvar dysplasia      Allergies Allergies  Allergen Reactions  . Remicade [Infliximab] Shortness Of Breath and Itching  . Sulfa Antibiotics Other (See Comments)    Other Reaction: tounge cracked  . Actonel [Risedronate Sodium] Other (See Comments)  . Fosamax [Alendronate Sodium]     GI Bleed   . Procardia [Nifedipine] Hives  . Sulfur Swelling  . Wellbutrin [Bupropion] Anxiety    IV Location/Drains/Wounds Patient Lines/Drains/Airways Status   Active Line/Drains/Airways    Name:   Placement date:   Placement time:   Site:   Days:   Peripheral IV 01/29/17 Right Antecubital   01/29/17    1324    Antecubital   389   Peripheral IV 02/22/18 Right Antecubital   02/22/18    1542    Antecubital   less than 1   Airway   01/20/17    0905     398   Incision (Closed) 11/19/16 Leg Left   11/19/16    1348     460   Incision (Closed) 11/19/16 Groin   11/19/16    1451     460   Incision (Closed) 11/19/16 Other (Comment)   11/19/16    1400     460   Incision - 1 Port  Anterior   11/19/16    1328  460   Incision - 1 Port    11/19/16    1329     460   Incision - 1 Port    11/19/16    1329     460          Labs/Imaging Results for orders placed or performed during the hospital encounter of 02/22/18 (from the past 48 hour(s))  Comprehensive metabolic panel     Status: Abnormal   Collection Time: 02/22/18  3:52 PM  Result Value Ref Range   Sodium 130 (L) 135 - 145 mmol/L   Potassium 4.3 3.5 - 5.1 mmol/L   Chloride 98 (L) 101 - 111 mmol/L   CO2 23 22 - 32 mmol/L   Glucose, Bld 112 (H) 65 - 99 mg/dL   BUN 25 (H) 6 - 20 mg/dL   Creatinine, Ser 0.92 0.44 - 1.00 mg/dL   Calcium 8.2 (L) 8.9 - 10.3 mg/dL   Total Protein 6.4 (L) 6.5 - 8.1 g/dL   Albumin 3.4 (L) 3.5 - 5.0 g/dL   AST 32 15 - 41 U/L   ALT 29 14 - 54 U/L   Alkaline Phosphatase 88 38 - 126 U/L   Total Bilirubin 0.6 0.3 - 1.2 mg/dL   GFR calc non Af Amer >60 >60 mL/min   GFR calc Af Amer >60 >60 mL/min    Comment: (NOTE) The eGFR has been  calculated using the CKD EPI equation. This calculation has not been validated in all clinical situations. eGFR's persistently <60 mL/min signify possible Chronic Kidney Disease.    Anion gap 9 5 - 15    Comment: Performed at [facility], [address] [place], [place] [address]  Troponin I     Status: None   Collection Time: 02/22/18  3:52 PM  Result Value Ref Range   Troponin I <0.03 <0.03 ng/mL    Comment: Performed at [facility], [address] [place], [place] [address]  CBC with Differential     Status: Abnormal   Collection Time: 02/22/18  3:52 PM  Result Value Ref Range   WBC 11.1 (H) 3.6 - 11.0 K/uL   RBC 4.05 3.80 - 5.20 MIL/uL   Hemoglobin 10.7 (L) 12.0 - 16.0 g/dL   HCT 33.8 (L) 35.0 - 47.0 %   MCV 83.7 80.0 - 100.0 fL   MCH 26.4 26.0 - 34.0 pg   MCHC 31.6 (L) 32.0 - 36.0 g/dL   RDW 17.0 (H) 11.5 - 14.5 %   Platelets 291 150 - 440 K/uL   Neutrophils Relative % 84 %   Neutro Abs 9.4 (H) 1.4 - 6.5 K/uL   Lymphocytes Relative 8 %   Lymphs Abs 0.8 (L) 1.0 - 3.6 K/uL   Monocytes Relative 7 %   Monocytes Absolute 0.8 0.2 - 0.9 K/uL   Eosinophils Relative 0 %   Eosinophils Absolute 0.0 0 - 0.7 K/uL   Basophils Relative 1 %   Basophils Absolute 0.1 0 - 0.1 K/uL    Comment: Performed at [facility], [address] [place], [place] [address]  Type and screen [place]     Status: None   Collection Time: 02/22/18  3:52 PM  Result Value Ref Range   ABO/RH(D) O POS    Antibody Screen NEG    Sample Expiration      02/25/2018 Performed at [facility], [address], [place], [place] [address]   Protime-INR     Status:  None   Collection Time: 02/22/18  3:52 PM  Result Value Ref Range   Prothrombin Time 12.0 11.4 - 15.2 seconds   INR 0.89     Comment: Performed at [facility], [address], [place], [place] [address]   Dg Chest Port 1 View  Result Date: 02/22/2018 CLINICAL DATA:  [address]  down steps.  Left hip pain EXAM: PORTABLE CHEST 1 VIEW COMPARISON:  06/02/2017 FINDINGS: Bibasilar airspace disease unchanged, probable scarring. There may also be a component of atelectasis. Negative for heart failure or pneumonia.  No interval change IMPRESSION: Bibasilar airspace disease unchanged and appearing to represent chronic scarring. No acute abnormality. Electronically Signed   By: Franchot Gallo M.D.   On: 02/22/2018 16:37   Dg Hip Unilat With Pelvis 2-3 Views Left  Result Date: 02/22/2018 CLINICAL DATA:  Fall.  Hip pain EXAM: DG HIP (WITH OR WITHOUT PELVIS) 2-3V LEFT COMPARISON:  None. FINDINGS: Subcapital femur fracture on the left with mild displacement and impaction. Left hip joint normal. IMPRESSION: Subcapital fracture left femur Electronically Signed   By: Franchot Gallo M.D.   On: 02/22/2018 16:33    Pending Labs Unresulted Labs (From admission, onward)   None      Vitals/Pain Today's Vitals   02/22/18 1645 02/22/18 1700 02/22/18 1718 02/22/18 1730  BP:  (!) 143/73  (!) 152/73  Pulse: 72     Resp: 18 (!) 21  18  Temp:      TempSrc:      SpO2: 91%     Weight:      Height:      PainSc:   3      Isolation Precautions No active isolations  Medications Medications  fentaNYL (SUBLIMAZE) injection 75 mcg (75 mcg Intravenous Given 02/22/18 1634)    Mobility walks

## 2018-02-22 NOTE — ED Notes (Signed)
Patient transported to X-ray 

## 2018-02-22 NOTE — ED Triage Notes (Addendum)
Pt comes via [facility] with c/o fall. EMS states pt was walking down steps and got out of breath and she sat down. Pt then attempted to stand up and fell on her left side. Pt given 75 mcg fent. Pt denies LOC. Pt states pain in left hip.

## 2018-02-22 NOTE — Progress Notes (Signed)
Pt arrived to room 150. IV infusing. Skin assessment completed with York Cerise, RN. Pt on room air. Call bell and phone within reach. Pain medication given in ED right before transfer. Pt made comfortable in bed. Family at bedside.

## 2018-02-22 NOTE — ED Notes (Signed)
Orthopedic surgeon is at the bedside informing pt about surgery.  Pt appears in no distress.  Family remains at the bedside.

## 2018-02-22 NOTE — H&P (Signed)
[facility] [place] NAME: Erin [name]    MR#:  [id number]  DATE OF BIRTH:  07-Mar-1946  DATE OF ADMISSION:  02/22/2018  PRIMARY CARE PHYSICIAN: Idelle Crouch, MD   REQUESTING/REFERRING PHYSICIAN: Dr. Mable Paris  CHIEF COMPLAINT:   Left hip pain after mechanical fall at home HISTORY OF PRESENT ILLNESS:  Erin [name]  is a 72 y.o. female with a known history of rheumatoid arthritis, [name], hypertension, [name], hyperlipidemia comes to the emergency room after patient was ambulating at home with a cane and trying to get down the steps missed this last step at home had a mechanical fall and started having left hip pain. In the emergency room showed left subcapital femur fracture.  Patient denies any chest pain.  She has occasional exertional shortness of breath.  She has history of chronic interstitial fibrosis secondary to her rheumatoid arthritis and scleroderma. Her sats 100% on room air at present. Patient is being admitted for further vaginal management.  PAST MEDICAL HISTORY:   Past Medical History:  Diagnosis Date  . Anemia    h/o  . Cancer ([place])    melanoma x 2 the last one was of her leg and surgeon removed it  . Coronary artery disease   . DDD (degenerative disc disease), lumbar   . Dysphagia   . Dyspnea    with exertion-unable to walk a mile without getting sob- dr sparks set pt up to see Dr Rockey Situ after 11-19-16 surgery  . Esophageal dysmotility   . Fibrocystic breast disease   . GERD (gastroesophageal reflux disease)   . Heart murmur    h/o   . Hyperlipidemia   . Hypertension   . Mitral regurgitation   . Osteopenia   . Pulmonary fibrosis ([place])    per Dr Raul Del  . RA (rheumatoid arthritis) ([place])   . Raynaud's disease   . Scleroderma ([place])   . Vulvar dysplasia     PAST SURGICAL HISTOIRY:   Past Surgical History:  Procedure Laterality Date  . ABDOMINAL HYSTERECTOMY    . BREAST CYST  EXCISION Left 20+ years ago   No scar visible  . CARPAL TUNNEL RELEASE Bilateral   . COLONOSCOPY    . COLONOSCOPY WITH PROPOFOL N/A 01/20/2017   Procedure: COLONOSCOPY WITH PROPOFOL;  Surgeon: Lollie Sails, MD;  Location: [facility] ENDOSCOPY;  Service: Endoscopy;  Laterality: N/A;  . ESOPHAGOGASTRODUODENOSCOPY    . ESOPHAGOGASTRODUODENOSCOPY (EGD) WITH PROPOFOL N/A 01/20/2017   Procedure: ESOPHAGOGASTRODUODENOSCOPY (EGD) WITH PROPOFOL;  Surgeon: Lollie Sails, MD;  Location: [facility] ENDOSCOPY;  Service: Endoscopy;  Laterality: N/A;  . EXCISION HYDRADENITIS LABIA Left 11/19/2016   Procedure: EXCISION LABIAL MASS;  Surgeon: Robert Bellow, MD;  Location: [facility] ORS;  Service: General;  Laterality: Left;  . EYE SURGERY Bilateral    cataracts  . HEEL SPUR EXCISION    . MASS EXCISION Left 11/19/2016   Procedure: EXCISION LEFT THIGH MELANOMA;  Surgeon: Robert Bellow, MD;  Location: [facility] ORS;  Service: General;  Laterality: Left;  . SENTINEL NODE BIOPSY Left 11/19/2016   Procedure: INGUINAL SENTINEL NODE BIOPSY;  Surgeon: Robert Bellow, MD;  Location: [facility] ORS;  Service: General;  Laterality: Left;  . UPPER ESOPHAGEAL ENDOSCOPIC ULTRASOUND (EUS) N/A 01/29/2017   Procedure: UPPER ESOPHAGEAL ENDOSCOPIC ULTRASOUND (EUS);  Surgeon: Holly Bodily, MD;  Location: [facility] ENDOSCOPY;  Service: Endoscopy;  Laterality: N/A;    SOCIAL HISTORY:   Social History  Tobacco Use  . Smoking status: Never Smoker  . Smokeless tobacco: Never Used  Substance Use Topics  . Alcohol use: No    FAMILY HISTORY:   Family History  Problem Relation Age of Onset  . Endometrial cancer Mother 37  . Bladder Cancer Father   . Cerebral palsy Sister     DRUG ALLERGIES:   Allergies  Allergen Reactions  . Remicade [Infliximab] Shortness Of Breath and Itching  . Sulfa Antibiotics Other (See Comments)    Other Reaction: tounge cracked  . Actonel [Risedronate Sodium] Other (See Comments)  . Fosamax  [Alendronate Sodium]     GI Bleed   . Procardia [Nifedipine] Hives  . Sulfur Swelling  . Wellbutrin [Bupropion] Anxiety    REVIEW OF SYSTEMS:  Review of Systems  Constitutional: Negative for chills, fever and weight loss.  HENT: Negative for ear discharge, ear pain and nosebleeds.   Eyes: Negative for blurred vision, pain and discharge.  Respiratory: Positive for shortness of breath. Negative for sputum production, wheezing and stridor.   Cardiovascular: Negative for chest pain, palpitations, orthopnea and PND.  Gastrointestinal: Negative for abdominal pain, diarrhea, nausea and vomiting.  Genitourinary: Negative for frequency and urgency.  Musculoskeletal: Positive for back pain. Negative for joint pain.  Neurological: Positive for weakness. Negative for sensory change, speech change and focal weakness.  Psychiatric/Behavioral: Negative for depression and hallucinations. The patient is not nervous/anxious.      MEDICATIONS AT HOME:   Prior to Admission medications   Medication Sig Start Date End Date Taking? Authorizing Provider  ALPRAZolam Duanne Moron) 0.5 MG tablet Take 0.5 mg by mouth 4 (four) times daily.   Yes [provider]  amLODipine (NORVASC) 10 MG tablet Take 10 mg by mouth daily.   Yes [provider]  aspirin EC 81 MG tablet Take 81 mg by mouth at bedtime.    Yes [provider]  cetirizine (ZYRTEC) 10 MG tablet Take 10 mg by mouth daily as needed for allergies.   Yes [provider]  ezetimibe (ZETIA) 10 MG tablet TAKE ONE TABLET BY MOUTH EVERY DAY 11/16/17  Yes Gollan, Kathlene November, MD  FLUoxetine (PROZAC) 40 MG capsule Take 20 mg by mouth daily.   Yes [provider]  furosemide (LASIX) 20 MG tablet Take 20mg s daily as needed for swelling 08/04/16  Yes [provider]  gabapentin (NEURONTIN) 300 MG capsule Take 300 mg by mouth 3 (three) times daily.  03/16/17 03/16/18 Yes [provider]  hydroxychloroquine  (PLAQUENIL) 200 MG tablet Take 200 mg by mouth 2 (two) times daily.  03/16/17  Yes [provider]  imipramine (TOFRANIL) 25 MG tablet Take 25 mg by mouth 2 (two) times daily.   Yes [provider]  latanoprost (XALATAN) 0.005 % ophthalmic solution Place 1 drop into both eyes at bedtime.    Yes [provider]  Multiple Vitamin (MULTI-VITAMINS) TABS Take 1 tablet by mouth daily.    Yes [provider]  Omega-3 Fatty Acids (FISH OIL) 1000 MG CAPS Take 1 capsule by mouth 2 (two) times daily.    Yes [provider]  polyethylene glycol (MIRALAX / GLYCOLAX) packet Take 17 g by mouth daily.   Yes [provider]  predniSONE (DELTASONE) 5 MG tablet Take 5 mg by mouth 2 (two) times daily with a meal.    Yes [provider]  simvastatin (ZOCOR) 80 MG tablet Take 80mg s orally at bedtime 05/22/16  Yes [provider]  traMADol Veatrice Bourbon)  50 MG tablet Take 50 mg by mouth every 12 (twelve) hours as needed.   Yes [provider]  zolpidem (AMBIEN) 10 MG tablet Take 10 mg by mouth at bedtime as needed for sleep.  09/25/16  Yes [provider]      VITAL SIGNS:  Blood pressure (!) 143/73, pulse 72, temperature 98.7 F (37.1 C), temperature source Oral, resp. rate (!) 21, height 5\' 2"  (1.575 m), weight 86.2 kg (190 lb), SpO2 91 %.  PHYSICAL EXAMINATION:  GENERAL:  72 y.o.-year-old patient lying in the bed with no acute distress.  EYES: Pupils equal, round, reactive to light and accommodation. No scleral icterus. Extraocular muscles intact.  HEENT: Head atraumatic, normocephalic. Oropharynx and nasopharynx clear.  NECK:  Supple, no jugular venous distention. No thyroid enlargement, no tenderness.  LUNGS:  Decreased breath sounds bilaterally, no wheezing, rales,rhonchi or crepitation. No use of accessory muscles of respiration.  CARDIOVASCULAR: S1, S2 normal. No murmurs, rubs, or gallops.  ABDOMEN: Soft, nontender,  nondistended. Bowel sounds present. No organomegaly or mass.  EXTREMITIES: No pedal edema, cyanosis, or clubbing.  NEUROLOGIC: Cranial nerves II through XII are intact. Muscle strength 5/5 in all extremities. Sensation intact. Gait not checked.  PSYCHIATRIC: The patient is alert and oriented x 3.  SKIN: No obvious rash, lesion, or ulcer.   LABORATORY PANEL:   CBC Recent Labs  Lab 02/22/18 1552  WBC 11.1*  HGB 10.7*  HCT 33.8*  PLT 291   ------------------------------------------------------------------------------------------------------------------  Chemistries  Recent Labs  Lab 02/22/18 1552  NA 130*  K 4.3  CL 98*  CO2 23  GLUCOSE 112*  BUN 25*  CREATININE 0.92  CALCIUM 8.2*  AST 32  ALT 29  ALKPHOS 88  BILITOT 0.6   ------------------------------------------------------------------------------------------------------------------  Cardiac Enzymes Recent Labs  Lab 02/22/18 1552  TROPONINI <0.03   ------------------------------------------------------------------------------------------------------------------  RADIOLOGY:  Dg Chest Port 1 View  Result Date: 02/22/2018 CLINICAL DATA:  [address] down steps.  Left hip pain EXAM: PORTABLE CHEST 1 VIEW COMPARISON:  06/02/2017 FINDINGS: Bibasilar airspace disease unchanged, probable scarring. There may also be a component of atelectasis. Negative for heart failure or pneumonia.  No interval change IMPRESSION: Bibasilar airspace disease unchanged and appearing to represent chronic scarring. No acute abnormality. Electronically Signed   By: Franchot Gallo M.D.   On: 02/22/2018 16:37   Dg Hip Unilat With Pelvis 2-3 Views Left  Result Date: 02/22/2018 CLINICAL DATA:  Fall.  Hip pain EXAM: DG HIP (WITH OR WITHOUT PELVIS) 2-3V LEFT COMPARISON:  None. FINDINGS: Subcapital femur fracture on the left with mild displacement and impaction. Left hip joint normal. IMPRESSION: Subcapital fracture left femur Electronically Signed   By:  Franchot Gallo M.D.   On: 02/22/2018 16:33    EKG:  Normal sinus rhythm.  Left anterior fascicular block. No acute ST elevation or depression  IMPRESSION AND PLAN:   Erin [name]  is a 72 y.o. female with a known history of rheumatoid arthritis, [name], hypertension, [name], hyperlipidemia comes to the emergency room after patient was ambulating at home with a cane and trying to get down the steps missed this last step at home had a mechanical fall and started having left hip pain.  1.  Acute left femoral subcapital fracture status post mechanical fall at home -To orthopedic floor -Orthopedic consultation with Dr. Elie Confer -Patient is at a moderate/intermediate risk given her multiple medical problems -Incentive spirometer, PRN nebs, as needed oxygen.  Current sats are more than 95% on  room air  2.  History of chronic interstitial fibrosis in the setting of rheumatoid arthritis and scleroderma -Closed with pulmonary as outpatient. -Pulmonary function test in 2018 showed no obstruction -Previous chest x-ray shows bibasilar interstitial fibrosis/scarring -Sats are stable. -Continue pulmonary toilet/rehab perioperative  3.  History of [name] -Stress test in 2018 unremarkable -Follows with Dr. Rockey Situ -Continue cardiac meds  4.  History of Raynouds, scleroderma, rheumatoid arthritis -Continue prednisone and Plaquenil  5.  DVT prophylaxis -SCD and teds pending or throat surgery  Risk and complications for surgery were discussed with patient and family.  They voiced understanding.  Above was discussed with Dr. Mack Guise    All the records are reviewed and case discussed with ED provider. Management plans discussed with the patient, family and they are in agreement.  CODE STATUS: FULL  TOTAL TIME TAKING CARE OF THIS PATIENT: 50 minutes.    Fritzi Mandes M.D on 02/22/2018 at 5:20 PM  Between 7am to 6pm - Pager - [phone]  After 6pm go to [email or web address] -  password [email or web address] [facility]  [facility]  Office  [phone]  CC: Primary care physician; Idelle Crouch, MD

## 2018-02-22 NOTE — Consult Note (Signed)
ORTHOPAEDIC CONSULTATION  REQUESTING PHYSICIAN: Max Sane, MD  Chief Complaint: Left femoral neck hip fracture  HPI: Erin [name] is a 72 y.o. female who complains of left hip pain status post fall at home.  Patient lost her balance on the stairs and fell backwards. She had left hip pain and an inability to bear weight following this injury.  X-rays taken at [facility] demonstrated a displaced left femoral neck fracture.  Patient has a history of rheumatoid arthritis, pulmonary fibrosis and scleroderma.  I have discussed this case with Dr. Fritzi Mandes, the hospitalist admitting this patient.  She has her pastor and friends are at the bedside in the  Past Medical History:  Diagnosis Date  . Anemia    h/o  . Cancer ([place])    melanoma x 2 the last one was of her leg and surgeon removed it  . Coronary artery disease   . DDD (degenerative disc disease), lumbar   . Dysphagia   . Dyspnea    with exertion-unable to walk a mile without getting sob- dr sparks set pt up to see Dr Rockey Situ after 11-19-16 surgery  . Esophageal dysmotility   . Fibrocystic breast disease   . GERD (gastroesophageal reflux disease)   . Heart murmur    h/o   . Hyperlipidemia   . Hypertension   . Mitral regurgitation   . Osteopenia   . Pulmonary fibrosis ([place])    per Dr Raul Del  . RA (rheumatoid arthritis) ([place])   . Raynaud's disease   . Scleroderma ([place])   . Vulvar dysplasia    Past Surgical History:  Procedure Laterality Date  . ABDOMINAL HYSTERECTOMY    . BREAST CYST EXCISION Left 20+ years ago   No scar visible  . CARPAL TUNNEL RELEASE Bilateral   . COLONOSCOPY    . COLONOSCOPY WITH PROPOFOL N/A 01/20/2017   Procedure: COLONOSCOPY WITH PROPOFOL;  Surgeon: Lollie Sails, MD;  Location: [facility] ENDOSCOPY;  Service: Endoscopy;  Laterality: N/A;  . ESOPHAGOGASTRODUODENOSCOPY    . ESOPHAGOGASTRODUODENOSCOPY (EGD) WITH PROPOFOL N/A 01/20/2017   Procedure: ESOPHAGOGASTRODUODENOSCOPY (EGD) WITH PROPOFOL;   Surgeon: Lollie Sails, MD;  Location: [facility] ENDOSCOPY;  Service: Endoscopy;  Laterality: N/A;  . EXCISION HYDRADENITIS LABIA Left 11/19/2016   Procedure: EXCISION LABIAL MASS;  Surgeon: Robert Bellow, MD;  Location: [facility] ORS;  Service: General;  Laterality: Left;  . EYE SURGERY Bilateral    cataracts  . HEEL SPUR EXCISION    . MASS EXCISION Left 11/19/2016   Procedure: EXCISION LEFT THIGH MELANOMA;  Surgeon: Robert Bellow, MD;  Location: [facility] ORS;  Service: General;  Laterality: Left;  . SENTINEL NODE BIOPSY Left 11/19/2016   Procedure: INGUINAL SENTINEL NODE BIOPSY;  Surgeon: Robert Bellow, MD;  Location: [facility] ORS;  Service: General;  Laterality: Left;  . UPPER ESOPHAGEAL ENDOSCOPIC ULTRASOUND (EUS) N/A 01/29/2017   Procedure: UPPER ESOPHAGEAL ENDOSCOPIC ULTRASOUND (EUS);  Surgeon: Holly Bodily, MD;  Location: [facility] ENDOSCOPY;  Service: Endoscopy;  Laterality: N/A;   Social History   Socioeconomic History  . Marital status: Widowed    Spouse name: None  . Number of children: None  . Years of education: None  . Highest education level: None  Social Needs  . Financial resource strain: None  . Food insecurity - worry: None  . Food insecurity - inability: None  . Transportation needs - medical: None  . Transportation needs - non-medical: None  Occupational History  . Occupation: retired  Tobacco Use  .  Smoking status: Never Smoker  . Smokeless tobacco: Never Used  Substance and Sexual Activity  . Alcohol use: No  . Drug use: No  . Sexual activity: None  Other Topics Concern  . None  Social History Narrative  . None   Family History  Problem Relation Age of Onset  . Endometrial cancer Mother 39  . Bladder Cancer Father   . Cerebral palsy Sister    Allergies  Allergen Reactions  . Remicade [Infliximab] Shortness Of Breath and Itching  . Sulfa Antibiotics Other (See Comments)    Other Reaction: tounge cracked  . Actonel [Risedronate Sodium] Other  (See Comments)  . Fosamax [Alendronate Sodium]     GI Bleed   . Procardia [Nifedipine] Hives  . Sulfur Swelling  . Wellbutrin [Bupropion] Anxiety   Prior to Admission medications   Medication Sig Start Date End Date Taking? Authorizing Provider  ALPRAZolam Duanne Moron) 0.5 MG tablet Take 0.5 mg by mouth 4 (four) times daily.   Yes [provider]  amLODipine (NORVASC) 10 MG tablet Take 10 mg by mouth daily.   Yes [provider]  aspirin EC 81 MG tablet Take 81 mg by mouth at bedtime.    Yes [provider]  cetirizine (ZYRTEC) 10 MG tablet Take 10 mg by mouth daily as needed for allergies.   Yes [provider]  ezetimibe (ZETIA) 10 MG tablet TAKE ONE TABLET BY MOUTH EVERY DAY 11/16/17  Yes Gollan, Kathlene November, MD  FLUoxetine (PROZAC) 40 MG capsule Take 20 mg by mouth daily.   Yes [provider]  furosemide (LASIX) 20 MG tablet Take 20mg s daily as needed for swelling 08/04/16  Yes [provider]  gabapentin (NEURONTIN) 300 MG capsule Take 300 mg by mouth 3 (three) times daily.  03/16/17 03/16/18 Yes [provider]  hydroxychloroquine (PLAQUENIL) 200 MG tablet Take 200 mg by mouth 2 (two) times daily.  03/16/17  Yes [provider]  imipramine (TOFRANIL) 25 MG tablet Take 25 mg by mouth 2 (two) times daily.   Yes [provider]  latanoprost (XALATAN) 0.005 % ophthalmic solution Place 1 drop into both eyes at bedtime.    Yes [provider]  Multiple Vitamin (MULTI-VITAMINS) TABS Take 1 tablet by mouth daily.    Yes [provider]  Omega-3 Fatty Acids (FISH OIL) 1000 MG CAPS Take 1 capsule by mouth 2 (two) times daily.    Yes [provider]  polyethylene glycol (MIRALAX / GLYCOLAX) packet Take 17 g by mouth daily.   Yes [provider]  predniSONE (DELTASONE) 5 MG tablet Take 5 mg by mouth 2 (two) times daily with a meal.    Yes [provider]  simvastatin (ZOCOR) 80 MG  tablet Take 80mg s orally at bedtime 05/22/16  Yes [provider]  traMADol (ULTRAM) 50 MG tablet Take 50 mg by mouth every 12 (twelve) hours as needed.   Yes [provider]  zolpidem (AMBIEN) 10 MG tablet Take 10 mg by mouth at bedtime as needed for sleep.  09/25/16  Yes [provider]   Dg Chest Port 1 View  Result Date: 02/22/2018 CLINICAL DATA:  [place] down steps.  Left hip pain EXAM: PORTABLE CHEST 1 VIEW COMPARISON:  06/02/2017 FINDINGS: Bibasilar airspace disease unchanged, probable scarring. There may also be a component of atelectasis. Negative for heart failure or pneumonia.  No interval change IMPRESSION: Bibasilar airspace disease unchanged and appearing to represent chronic scarring. No acute abnormality. Electronically Signed  By: Franchot Gallo M.D.   On: 02/22/2018 16:37   Dg Hip Unilat With Pelvis 2-3 Views Left  Result Date: 02/22/2018 CLINICAL DATA:  Fall.  Hip pain EXAM: DG HIP (WITH OR WITHOUT PELVIS) 2-3V LEFT COMPARISON:  None. FINDINGS: Subcapital femur fracture on the left with mild displacement and impaction. Left hip joint normal. IMPRESSION: Subcapital fracture left femur Electronically Signed   By: Franchot Gallo M.D.   On: 02/22/2018 16:33    Positive ROS: All other systems have been reviewed and were otherwise negative with the exception of those mentioned in the HPI and as above.  Physical Exam: General: Alert, no acute distress  MUSCULOSKELETAL: left hip: Patient has shortening and external rotation to the left lower extremity. She has palpable pedal pulses, intact sensation light touch and intact motor function. Skin overlying the left hip is intact. The thigh compartments are soft and compressible.  Assessment: Left femoral neck hip fracture  Plan: I explained to the patient the nature of her injury. She understands she has a displaced femoral neck hip fracture. I have recommended hemiarthroplasty this fracture.  I explained the  details of this operation as well as the postoperative course.  We also discussed the risks of surgery which include infection requiring removal of prosthesis, bleeding requiring blood transfusion, nerve or blood vessel injury including injury to the sciatic nerve leading to foot drop, leg length discrepancy, change in lower extremity rotation, leg length discrepancy, change in lower extremity rotation, persistent hip pain, fracture, dislocation, and the need for further surgery including conversion to a total hip arthroplasty. She understands the medical risks include but are not limited to DVT and pulmonary embolism, myocardial infarction, stroke, pneumonia, respiratory failure and death.Patient understood these risks. She wished to proceed with surgery.  I have reviewed all labs and radiographic studies in preparation for this case. Patient is cleared for surgery by the hospitalist.    Thornton Park, MD    02/22/2018 5:59 PM

## 2018-02-22 NOTE — ED Provider Notes (Signed)
[facility] Emergency Department Provider Note  ____________________________________________   First MD Initiated Contact with Patient 02/22/18 1539     (approximate)  I have reviewed the triage vital signs and the nursing notes.   HISTORY  Chief Complaint Fall   HPI Erin [name] is a 72 y.o. female who comes to the emergency department via EMS after mechanical fall.  She was walking down steps at her home began to become short of breath and she sat down.  When she attempted to stand up she slipped and fell onto her left side.  She suffered sudden onset immediate severe left hip pain.  The pain is worse when trying to extend her leg or ranging her hip.  She was given 75 mcg of fentanyl in route which did seem to improve her symptoms.  She did not hit her head.  She did not lose consciousness.  She denies numbness or weakness.  She takes aspirin but no blood thinning medication.  Past Medical History:  Diagnosis Date  . Anemia    h/o  . Cancer ([place])    melanoma x 2 the last one was of her leg and surgeon removed it  . Coronary artery disease   . DDD (degenerative disc disease), lumbar   . Dysphagia   . Dyspnea    with exertion-unable to walk a mile without getting sob- dr sparks set pt up to see Dr Rockey Situ after 11-19-16 surgery  . Esophageal dysmotility   . Fibrocystic breast disease   . GERD (gastroesophageal reflux disease)   . Heart murmur    h/o   . Hyperlipidemia   . Hypertension   . Mitral regurgitation   . Osteopenia   . Pulmonary fibrosis ([place])    per Dr Raul Del  . RA (rheumatoid arthritis) ([place])   . Raynaud's disease   . Scleroderma ([place])   . Vulvar dysplasia     Patient Active Problem List   Diagnosis Date Noted  . Hip fracture ([place]) 02/22/2018  . Hyperlipidemia 10/26/2017  . [name] (coronary artery disease), native coronary artery 10/24/2017  . Vulvar irritation 09/02/2017  . 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 High grade squamous  intraepithelial lesion on cytologic smear of vagina (HGSIL) 12/16/2016  . Screening for cardiovascular condition 12/15/2016  . Shortness of breath 12/15/2016  . 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 Scleroderma ([place]) 12/15/2016  . Raynaud's disease without gangrene 12/15/2016  . Melanoma of skin ([place]) 11/11/2016  . Lesion of labia 11/11/2016    Past Surgical History:  Procedure Laterality Date  . ABDOMINAL HYSTERECTOMY    . BREAST CYST EXCISION Left 20+ years ago   No scar visible  . CARPAL TUNNEL RELEASE Bilateral   . COLONOSCOPY    . COLONOSCOPY WITH PROPOFOL N/A 01/20/2017   Procedure: COLONOSCOPY WITH PROPOFOL;  Surgeon: Lollie Sails, MD;  Location[facility] ENDOSCOPY;  Service: Endoscopy;  Laterality: N/A;  . ESOPHAGOGASTRODUODENOSCOPY    . ESOPHAGOGASTRODUODENOSCOPY (EGD) WITH PROPOFOL N/A 01/20/2017   Procedure: ESOPHAGOGASTRODUODENOSCOPY (EGD) WITH PROPOFOL;  Surgeon: Lollie Sails, MD;  Location: [facility] ENDOSCOPY;  Service: Endoscopy;  Laterality: N/A;  . EXCISION HYDRADENITIS LABIA Left 11/19/2016   Procedure: EXCISION LABIAL MASS;  Surgeon: Robert Bellow, MD;  Location: [facility] ORS;  Service: General;  Laterality: Left;  . EYE SURGERY Bilateral    cataracts  . HEEL SPUR EXCISION    . MASS EXCISION Left 11/19/2016   Procedure: EXCISION LEFT THIGH MELANOMA;  Surgeon:  Robert Bellow, MD;  Location: [facility] ORS;  Service: General;  Laterality: Left;  . SENTINEL NODE BIOPSY Left 11/19/2016   Procedure: INGUINAL SENTINEL NODE BIOPSY;  Surgeon: Robert Bellow, MD;  Location: [facility] ORS;  Service: General;  Laterality: Left;  . UPPER ESOPHAGEAL ENDOSCOPIC ULTRASOUND (EUS) N/A 01/29/2017   Procedure: UPPER ESOPHAGEAL ENDOSCOPIC ULTRASOUND (EUS);  Surgeon: Holly Bodily, MD;  Location: [facility] ENDOSCOPY;  Service: Endoscopy;  Laterality: N/A;    Prior to Admission medications   Medication Sig Start Date End Date Taking? Authorizing Provider    ALPRAZolam Duanne Moron) 0.5 MG tablet Take 0.5 mg by mouth 4 (four) times daily.   Yes [provider]  amLODipine (NORVASC) 10 MG tablet Take 10 mg by mouth daily.   Yes [provider]  aspirin EC 81 MG tablet Take 81 mg by mouth at bedtime.    Yes [provider]  cetirizine (ZYRTEC) 10 MG tablet Take 10 mg by mouth daily as needed for allergies.   Yes [provider]  ezetimibe (ZETIA) 10 MG tablet TAKE ONE TABLET BY MOUTH EVERY DAY 11/16/17  Yes Gollan, Kathlene November, MD  FLUoxetine (PROZAC) 40 MG capsule Take 20 mg by mouth daily.   Yes [provider]  furosemide (LASIX) 20 MG tablet Take 20mg s daily as needed for swelling 08/04/16  Yes [provider]  gabapentin (NEURONTIN) 300 MG capsule Take 300 mg by mouth 3 (three) times daily.  03/16/17 03/16/18 Yes [provider]  hydroxychloroquine (PLAQUENIL) 200 MG tablet Take 200 mg by mouth 2 (two) times daily.  03/16/17  Yes [provider]  imipramine (TOFRANIL) 25 MG tablet Take 25 mg by mouth 2 (two) times daily.   Yes [provider]  latanoprost (XALATAN) 0.005 % ophthalmic solution Place 1 drop into both eyes at bedtime.    Yes [provider]  Multiple Vitamin (MULTI-VITAMINS) TABS Take 1 tablet by mouth daily.    Yes [provider]  Omega-3 Fatty Acids (FISH OIL) 1000 MG CAPS Take 1 capsule by mouth 2 (two) times daily.    Yes [provider]  polyethylene glycol (MIRALAX / GLYCOLAX) packet Take 17 g by mouth daily.   Yes [provider]  predniSONE (DELTASONE) 5 MG tablet Take 5 mg by mouth 2 (two) times daily with a meal.    Yes [provider]  simvastatin (ZOCOR) 80 MG tablet Take 80mg s orally at bedtime 05/22/16  Yes [provider]  traMADol (ULTRAM) 50 MG tablet Take 50 mg by mouth every 12 (twelve) hours as needed.   Yes [provider]  zolpidem (AMBIEN) 10 MG tablet Take 10 mg by mouth at bedtime as  needed for sleep.  09/25/16  Yes [provider]    Allergies Remicade [infliximab]; Sulfa antibiotics; Actonel [risedronate sodium]; Fosamax [alendronate sodium]; Procardia [nifedipine]; Sulfur; and Wellbutrin [bupropion]  Family History  Problem Relation Age of Onset  . Endometrial cancer Mother 29  . Bladder Cancer Father   . Cerebral palsy Sister     Social History Social History   Tobacco Use  . Smoking status: Never Smoker  . Smokeless tobacco: Never Used  Substance Use Topics  . Alcohol use: No  . Drug use: No    Review of Systems Constitutional: No fever/chills Eyes: No visual changes. ENT: No sore throat. Cardiovascular: Denies chest pain. Respiratory: Positive for shortness of breath. Gastrointestinal: No abdominal pain.  No nausea, no vomiting.  No diarrhea.  No constipation.  Genitourinary: Negative for dysuria. Musculoskeletal: Positive for hip pain Skin: Negative for rash. Neurological: Negative for headaches, focal weakness or numbness.   ____________________________________________   PHYSICAL EXAM:  VITAL SIGNS: ED Triage Vitals  Enc Vitals Group     BP      Pulse      Resp      Temp      Temp src      SpO2      Weight      Height      Head Circumference      Peak Flow      Pain Score      Pain Loc      Pain Edu?      Excl. in [place]?     Constitutional: Alert and oriented x4 tearful and appears exquisitely uncomfortable holding her left hip in flexion and wincing Eyes: PERRL EOMI. Head: Atraumatic. Nose: No congestion/rhinnorhea. Mouth/Throat: No trismus Neck: No stridor.   Cardiovascular: Normal rate, regular rhythm. Grossly normal heart sounds.  Good peripheral circulation. Respiratory: Normal respiratory effort.  No retractions. Lungs CTAB and moving good air Gastrointestinal: Soft nondistended nontender no rebound no guarding no peritonitis Musculoskeletal: Extreme discomfort and crying in pain with even the slightest  ranging of the left hip 2+ dorsalis pedis pulse and neurovascularly intact Neurologic:  Normal speech and language. No gross focal neurologic deficits are appreciated. Skin:  Skin is warm, dry and intact. No rash noted. Psychiatric: Appears exquisitely uncomfortable   ____________________________________________   DIFFERENTIAL includes but not limited to  Femoral fracture, hip fracture, hip dislocation, mechanical fall, syncope, dehydration ____________________________________________   LABS (all labs ordered are listed, but only abnormal results are displayed)  Labs Reviewed  COMPREHENSIVE METABOLIC PANEL - Abnormal; Notable for the following components:      Result Value   Sodium 130 (*)    Chloride 98 (*)    Glucose, Bld 112 (*)    BUN 25 (*)    Calcium 8.2 (*)    Total Protein 6.4 (*)    Albumin 3.4 (*)    All other components within normal limits  CBC WITH DIFFERENTIAL/PLATELET - Abnormal; Notable for the following components:   WBC 11.1 (*)    Hemoglobin 10.7 (*)    HCT 33.8 (*)    MCHC 31.6 (*)    RDW 17.0 (*)    Neutro Abs 9.4 (*)    Lymphs Abs 0.8 (*)    All other components within normal limits  SURGICAL PCR SCREEN  TROPONIN I  PROTIME-INR  CBC  BASIC METABOLIC PANEL  TYPE AND SCREEN  SURGICAL PATHOLOGY    Lab work reviewed by me with elevated white count which is nonspecific __________________________________________  EKG  ED ECG REPORT I, Darel Hong, the attending physician, personally viewed and interpreted this ECG.  Date: 02/22/2018 EKG Time:  Rate: 77 Rhythm: normal sinus rhythm QRS Axis: normal Intervals: normal ST/T Wave abnormalities: normal Narrative Interpretation: no evidence of acute ischemia  ____________________________________________  RADIOLOGY  X-ray of the left hip reviewed by me consistent with hip fracture ____________________________________________   PROCEDURES  Procedure(s) performed:  no  Procedures  Critical Care performed: no  Observation: no ____________________________________________   INITIAL IMPRESSION / ASSESSMENT AND PLAN / ED COURSE  Pertinent labs & imaging results that were available during my care of the patient were reviewed by me and considered in my medical decision making (see chart for details).  The patient arrives exquisitely uncomfortable what sounds like clearly  a mechanical fall.  High clinical suspicion for left hip fracture but secondary to the patient's severe pain she will require further fentanyl to help facilitate the x-rays.  My clinical suspicion is very high so I will also add on a chest x-ray for preop clearance as well as blood work.  Following fentanyl the patient's pain was improved.  X-ray confirms left hip fracture.  I have a call out to orthopedic surgery now.     ----------------------------------------- 4:45 PM on 02/22/2018 -----------------------------------------  I spoke with Dr. Mack Guise who recommends inpatient admission to the hospitalist service and he will kindly consult on the patient for likely surgery tomorrow.  I discussed with the patient and family who verbalized understanding and agreement with the plan.  I then discussed with the hospitalist who has graciously agreed to admit the patient to his service. ____________________________________________   FINAL CLINICAL IMPRESSION(S) / ED DIAGNOSES  Final diagnoses:  Hip fracture ([place])      NEW MEDICATIONS STARTED DURING THIS VISIT:  Current Discharge Medication List       Note:  This document was prepared using Dragon voice recognition software and may include unintentional dictation errors.     Darel Hong, MD 02/23/18 2043

## 2018-02-23 ENCOUNTER — Encounter
Admission: EM | Disposition: A | Discharge status: Skilled Nursing Facility | Payer: Self-pay | Source: Home / Self Care | Attending: Internal Medicine

## 2018-02-23 ENCOUNTER — Encounter: Payer: Self-pay | Admitting: Anesthesiology

## 2018-02-23 ENCOUNTER — Inpatient Hospital Stay: Payer: Medicare Other | Admitting: Certified Registered Nurse Anesthetist

## 2018-02-23 ENCOUNTER — Inpatient Hospital Stay: Payer: Medicare Other

## 2018-02-23 HISTORY — PX: HIP ARTHROPLASTY: SHX981

## 2018-02-23 LAB — SURGICAL PCR SCREEN
MRSA, PCR: NEGATIVE
STAPHYLOCOCCUS AUREUS: NEGATIVE

## 2018-02-23 SURGERY — HEMIARTHROPLASTY, HIP, DIRECT ANTERIOR APPROACH, FOR FRACTURE
Anesthesia: Spinal | Site: Hip | Laterality: Left | Wound class: Clean

## 2018-02-23 MED ORDER — KETAMINE HCL 50 MG/ML IJ SOLN
INTRAMUSCULAR | Status: AC
  Filled 2018-02-23: qty 10

## 2018-02-23 MED ORDER — FUROSEMIDE 20 MG PO TABS
20.0000 mg | ORAL_TABLET | Freq: Every day | ORAL | Status: DC
  Administered 2018-02-23 – 2018-02-24 (×2): 20 mg via ORAL
  Filled 2018-02-23: qty 1

## 2018-02-23 MED ORDER — ONDANSETRON HCL 4 MG/2ML IJ SOLN
INTRAMUSCULAR | Status: AC
  Filled 2018-02-23: qty 2

## 2018-02-23 MED ORDER — METHOCARBAMOL 500 MG PO TABS
500.0000 mg | ORAL_TABLET | Freq: Four times a day (QID) | ORAL | Status: DC | PRN
  Administered 2018-02-23: 500 mg via ORAL
  Filled 2018-02-23: qty 1

## 2018-02-23 MED ORDER — PROPOFOL 500 MG/50ML IV EMUL
INTRAVENOUS | Status: DC | PRN
  Administered 2018-02-23: 40 ug/kg/min via INTRAVENOUS

## 2018-02-23 MED ORDER — KETAMINE HCL 50 MG/ML IJ SOLN
INTRAMUSCULAR | Status: DC | PRN
  Administered 2018-02-23 (×3): 25 mg via INTRAMUSCULAR

## 2018-02-23 MED ORDER — CEFAZOLIN SODIUM-DEXTROSE 1-4 GM/50ML-% IV SOLN
1.0000 g | Freq: Four times a day (QID) | INTRAVENOUS | Status: AC
  Administered 2018-02-23 (×2): 1 g via INTRAVENOUS
  Filled 2018-02-23 (×3): qty 50

## 2018-02-23 MED ORDER — ALUM & MAG HYDROXIDE-SIMETH 200-200-20 MG/5ML PO SUSP: 30.0000 mL | ORAL | Status: DC | PRN

## 2018-02-23 MED ORDER — PHENYLEPHRINE HCL 10 MG/ML IJ SOLN
INTRAMUSCULAR | Status: DC | PRN
  Administered 2018-02-23: 100 ug via INTRAVENOUS
  Administered 2018-02-23: 300 ug via INTRAVENOUS

## 2018-02-23 MED ORDER — BISACODYL 10 MG RE SUPP
10.0000 mg | Freq: Every day | RECTAL | Status: DC | PRN
  Administered 2018-02-25: 10 mg via RECTAL
  Filled 2018-02-23: qty 1

## 2018-02-23 MED ORDER — SODIUM CHLORIDE 0.9 % IV SOLN
INTRAVENOUS | Status: DC | PRN
  Administered 2018-02-23: 25 ug/min via INTRAVENOUS

## 2018-02-23 MED ORDER — BUPIVACAINE HCL (PF) 0.5 % IJ SOLN
INTRAMUSCULAR | Status: AC
  Filled 2018-02-23: qty 10

## 2018-02-23 MED ORDER — ACETAMINOPHEN 10 MG/ML IV SOLN
INTRAVENOUS | Status: DC | PRN
  Administered 2018-02-23: 1000 mg via INTRAVENOUS

## 2018-02-23 MED ORDER — FERROUS SULFATE 325 (65 FE) MG PO TABS
325.0000 mg | ORAL_TABLET | Freq: Three times a day (TID) | ORAL | Status: DC
  Administered 2018-02-23 – 2018-02-24 (×4): 325 mg via ORAL
  Filled 2018-02-23 (×4): qty 1

## 2018-02-23 MED ORDER — OXYCODONE HCL 5 MG PO TABS: 10.0000 mg | ORAL_TABLET | ORAL | Status: DC | PRN

## 2018-02-23 MED ORDER — HYDROMORPHONE HCL 1 MG/ML IJ SOLN: 0.5000 mg | INTRAMUSCULAR | Status: DC | PRN

## 2018-02-23 MED ORDER — MAGNESIUM CITRATE PO SOLN
1.0000 | Freq: Once | ORAL | Status: DC | PRN
  Filled 2018-02-23 (×2): qty 296

## 2018-02-23 MED ORDER — BUPIVACAINE HCL (PF) 0.5 % IJ SOLN
INTRAMUSCULAR | Status: DC | PRN
  Administered 2018-02-23: 3 mL

## 2018-02-23 MED ORDER — ACETAMINOPHEN 10 MG/ML IV SOLN
INTRAVENOUS | Status: AC
  Filled 2018-02-23: qty 100

## 2018-02-23 MED ORDER — ONDANSETRON HCL 4 MG/2ML IJ SOLN
INTRAMUSCULAR | Status: DC | PRN
  Administered 2018-02-23: 4 mg via INTRAVENOUS

## 2018-02-23 MED ORDER — POTASSIUM CHLORIDE IN NACL 20-0.9 MEQ/L-% IV SOLN
INTRAVENOUS | Status: DC
  Administered 2018-02-23: 18:00:00 via INTRAVENOUS
  Filled 2018-02-23 (×5): qty 1000

## 2018-02-23 MED ORDER — LACTATED RINGERS IV SOLN
INTRAVENOUS | Status: DC
  Administered 2018-02-23: 11:00:00 via INTRAVENOUS

## 2018-02-23 MED ORDER — ASPIRIN EC 81 MG PO TBEC
81.0000 mg | DELAYED_RELEASE_TABLET | Freq: Every day | ORAL | Status: DC
  Administered 2018-02-23 – 2018-02-24 (×2): 81 mg via ORAL
  Filled 2018-02-23 (×2): qty 1

## 2018-02-23 MED ORDER — KETOROLAC TROMETHAMINE 15 MG/ML IJ SOLN
7.5000 mg | Freq: Four times a day (QID) | INTRAMUSCULAR | Status: AC
  Administered 2018-02-23 – 2018-02-24 (×3): 7.5 mg via INTRAVENOUS
  Filled 2018-02-23 (×3): qty 1

## 2018-02-23 MED ORDER — ACETAMINOPHEN 500 MG PO TABS
1000.0000 mg | ORAL_TABLET | Freq: Four times a day (QID) | ORAL | Status: AC
  Administered 2018-02-23 – 2018-02-24 (×3): 1000 mg via ORAL
  Filled 2018-02-23 (×3): qty 2

## 2018-02-23 MED ORDER — LORATADINE 10 MG PO TABS
10.0000 mg | ORAL_TABLET | Freq: Every day | ORAL | Status: DC
  Administered 2018-02-23 – 2018-02-25 (×3): 10 mg via ORAL
  Filled 2018-02-23 (×2): qty 1

## 2018-02-23 MED ORDER — IPRATROPIUM-ALBUTEROL 0.5-2.5 (3) MG/3ML IN SOLN
3.0000 mL | Freq: Once | RESPIRATORY_TRACT | Status: AC
  Administered 2018-02-23: 3 mL via RESPIRATORY_TRACT

## 2018-02-23 MED ORDER — IPRATROPIUM-ALBUTEROL 0.5-2.5 (3) MG/3ML IN SOLN
RESPIRATORY_TRACT | Status: AC
  Filled 2018-02-23: qty 3

## 2018-02-23 MED ORDER — ONDANSETRON HCL 4 MG/2ML IJ SOLN: 4.0000 mg | Freq: Four times a day (QID) | INTRAMUSCULAR | Status: DC | PRN

## 2018-02-23 MED ORDER — PROPOFOL 10 MG/ML IV BOLUS
INTRAVENOUS | Status: DC | PRN
  Administered 2018-02-23 (×3): 20 mg via INTRAVENOUS

## 2018-02-23 MED ORDER — ENOXAPARIN SODIUM 40 MG/0.4ML ~~LOC~~ SOLN
40.0000 mg | SUBCUTANEOUS | Status: DC
  Administered 2018-02-24 – 2018-02-25 (×2): 40 mg via SUBCUTANEOUS
  Filled 2018-02-23 (×2): qty 0.4

## 2018-02-23 MED ORDER — NEOMYCIN-POLYMYXIN B GU 40-200000 IR SOLN
Status: DC | PRN
  Administered 2018-02-23: 16 mL

## 2018-02-23 MED ORDER — FENTANYL CITRATE (PF) 100 MCG/2ML IJ SOLN
25.0000 ug | INTRAMUSCULAR | Status: DC | PRN
  Administered 2018-02-23 (×4): 25 ug via INTRAVENOUS

## 2018-02-23 MED ORDER — FENTANYL CITRATE (PF) 100 MCG/2ML IJ SOLN
INTRAMUSCULAR | Status: AC
  Administered 2018-02-23: 25 ug via INTRAVENOUS
  Filled 2018-02-23: qty 2

## 2018-02-23 MED ORDER — FENTANYL CITRATE (PF) 100 MCG/2ML IJ SOLN
INTRAMUSCULAR | Status: DC | PRN
  Administered 2018-02-23 (×3): 25 ug via INTRAVENOUS

## 2018-02-23 MED ORDER — ONDANSETRON HCL 4 MG PO TABS: 4.0000 mg | ORAL_TABLET | Freq: Four times a day (QID) | ORAL | Status: DC | PRN

## 2018-02-23 MED ORDER — MIDAZOLAM HCL 5 MG/5ML IJ SOLN
INTRAMUSCULAR | Status: DC | PRN
  Administered 2018-02-23: 1 mg via INTRAVENOUS

## 2018-02-23 MED ORDER — LIDOCAINE HCL (PF) 2 % IJ SOLN
INTRAMUSCULAR | Status: AC
  Filled 2018-02-23: qty 10

## 2018-02-23 MED ORDER — OXYCODONE HCL 5 MG PO TABS
5.0000 mg | ORAL_TABLET | ORAL | Status: DC | PRN
  Administered 2018-02-24 – 2018-02-25 (×3): 5 mg via ORAL
  Filled 2018-02-23 (×3): qty 1

## 2018-02-23 MED ORDER — SENNA 8.6 MG PO TABS
1.0000 | ORAL_TABLET | Freq: Two times a day (BID) | ORAL | Status: DC
  Administered 2018-02-23 – 2018-02-25 (×4): 8.6 mg via ORAL
  Filled 2018-02-23 (×3): qty 1

## 2018-02-23 MED ORDER — MIDAZOLAM HCL 2 MG/2ML IJ SOLN
INTRAMUSCULAR | Status: AC
  Filled 2018-02-23: qty 2

## 2018-02-23 MED ORDER — PROPOFOL 500 MG/50ML IV EMUL
INTRAVENOUS | Status: AC
  Filled 2018-02-23: qty 50

## 2018-02-23 MED ORDER — FENTANYL CITRATE (PF) 100 MCG/2ML IJ SOLN
INTRAMUSCULAR | Status: AC
  Filled 2018-02-23: qty 2

## 2018-02-23 MED ORDER — ONDANSETRON HCL 4 MG/2ML IJ SOLN: 4.0000 mg | Freq: Once | INTRAMUSCULAR | Status: DC | PRN

## 2018-02-23 MED ORDER — METHOCARBAMOL 1000 MG/10ML IJ SOLN
500.0000 mg | Freq: Four times a day (QID) | INTRAMUSCULAR | Status: DC | PRN
  Filled 2018-02-23: qty 5

## 2018-02-23 SURGICAL SUPPLY — 60 items
BAG URINE DRAINAGE (UROLOGICAL SUPPLIES) ×3 IMPLANT
BLADE SAGITTAL WIDE XTHICK NO (BLADE) ×3 IMPLANT
BLADE SURG SZ10 CARB STEEL (BLADE) ×3 IMPLANT
BNDG COHESIVE 4X5 TAN STRL (GAUZE/BANDAGES/DRESSINGS) ×3 IMPLANT
CANISTER SUCT 1200ML W/VALVE (MISCELLANEOUS) ×3 IMPLANT
CANISTER SUCT 3000ML PPV (MISCELLANEOUS) ×6 IMPLANT
DRAPE IMP U-DRAPE 54X76 (DRAPES) ×3 IMPLANT
DRAPE INCISE IOBAN 66X60 STRL (DRAPES) ×3 IMPLANT
DRAPE SHEET LG 3/4 BI-LAMINATE (DRAPES) ×6 IMPLANT
DRAPE SURG 17X11 SM STRL (DRAPES) ×3 IMPLANT
DRAPE TABLE BACK 80X90 (DRAPES) ×3 IMPLANT
DRESSING ALLEVYN LIFE SACRUM (GAUZE/BANDAGES/DRESSINGS) ×3 IMPLANT
DRSG OPSITE POSTOP 4X10 (GAUZE/BANDAGES/DRESSINGS) ×3 IMPLANT
DURAPREP 26ML APPLICATOR (WOUND CARE) ×12 IMPLANT
ELECT BLADE 6.5 EXT (BLADE) ×3 IMPLANT
ELECT CAUTERY BLADE 6.4 (BLADE) ×3 IMPLANT
ELECT REM PT RETURN 9FT ADLT (ELECTROSURGICAL) ×3
ELECTRODE REM PT RTRN 9FT ADLT (ELECTROSURGICAL) ×1 IMPLANT
GAUZE PETRO XEROFOAM 1X8 (MISCELLANEOUS) ×3 IMPLANT
GAUZE SPONGE 4X4 12PLY STRL (GAUZE/BANDAGES/DRESSINGS) ×3 IMPLANT
GLOVE BIOGEL PI IND STRL 7.0 (GLOVE) ×6 IMPLANT
GLOVE BIOGEL PI IND STRL 9 (GLOVE) ×1 IMPLANT
GLOVE BIOGEL PI INDICATOR 7.0 (GLOVE) ×12
GLOVE BIOGEL PI INDICATOR 9 (GLOVE) ×2
GLOVE SURG 9.0 ORTHO LTXF (GLOVE) ×6 IMPLANT
GOWN STRL REUS TWL 2XL XL LVL4 (GOWN DISPOSABLE) ×3 IMPLANT
GOWN STRL REUS W/ TWL LRG LVL3 (GOWN DISPOSABLE) ×2 IMPLANT
GOWN STRL REUS W/TWL LRG LVL3 (GOWN DISPOSABLE) ×4
HEAD MODULAR ENDO (Orthopedic Implant) ×2 IMPLANT
HEAD UNPLR 47XMDLR STRL HIP (Orthopedic Implant) ×1 IMPLANT
HEMOVAC 400ML (MISCELLANEOUS)
HOLDER FOLEY CATH W/STRAP (MISCELLANEOUS) ×3 IMPLANT
KIT DRAIN HEMOVAC JP 7FR 400ML (MISCELLANEOUS) IMPLANT
KIT TURNOVER KIT A (KITS) ×3 IMPLANT
NDL SAFETY ECLIPSE 18X1.5 (NEEDLE) ×1 IMPLANT
NEEDLE FILTER BLUNT 18X 1/2SAF (NEEDLE) ×2
NEEDLE FILTER BLUNT 18X1 1/2 (NEEDLE) ×1 IMPLANT
NEEDLE HYPO 18GX1.5 SHARP (NEEDLE) ×2
NEEDLE MAYO CATGUT SZ4 (NEEDLE) ×3 IMPLANT
NS IRRIG 1000ML POUR BTL (IV SOLUTION) ×3 IMPLANT
PACK HIP PROSTHESIS (MISCELLANEOUS) ×3 IMPLANT
PAD ABD DERMACEA PRESS 5X9 (GAUZE/BANDAGES/DRESSINGS) ×6 IMPLANT
PILLOW ABDUC SM (MISCELLANEOUS) ×3 IMPLANT
PULSAVAC PLUS IRRIG FAN TIP (DISPOSABLE) ×3
RETRIEVER SUT HEWSON (MISCELLANEOUS) ×3 IMPLANT
SLEEVE UNITRAX V40 STD (Orthopedic Implant) ×3 IMPLANT
SOL .9 NS 3000ML IRR  AL (IV SOLUTION) ×2
SOL .9 NS 3000ML IRR UROMATIC (IV SOLUTION) ×1 IMPLANT
STAPLER SKIN PROX 35W (STAPLE) ×3 IMPLANT
STEM HIP 127 DEG (Stem) ×3 IMPLANT
SUT TICRON 2-0 30IN 311381 (SUTURE) ×15 IMPLANT
SUT VIC AB 0 CT1 36 (SUTURE) ×9 IMPLANT
SUT VIC AB 2-0 CT2 27 (SUTURE) ×6 IMPLANT
SYR 10ML LL (SYRINGE) ×3 IMPLANT
TAPE MICROFOAM 4IN (TAPE) ×3 IMPLANT
TAPE TRANSPORE STRL 2 31045 (GAUZE/BANDAGES/DRESSINGS) ×3 IMPLANT
TIP BRUSH PULSAVAC PLUS 24.33 (MISCELLANEOUS) ×3 IMPLANT
TIP FAN IRRIG PULSAVAC PLUS (DISPOSABLE) ×1 IMPLANT
TRAY FOLEY CATH SILVER 16FR LF (SET/KITS/TRAYS/PACK) ×3 IMPLANT
TUBE KAMVAC SUCTION (TUBING) ×3 IMPLANT

## 2018-02-23 NOTE — Progress Notes (Signed)
Blood pressure  91 /58   Blood pressure mid to low 90's  Dr Rosey Bath called  No new orders

## 2018-02-23 NOTE — Progress Notes (Signed)
Patient returned from PACU. A&O x4. Big bulky dressing to left hip. CD&I. TEDs and Foot pumps in place. Patient in low bed and alarm in place. Reviewed POC and new orders with patient and ordered a clear liquid diet. IV fluids started.

## 2018-02-23 NOTE — Progress Notes (Signed)
  Subjective:  POST-OP CHECK:  Patient reports left hip pain as mild to moderate.  She is having cramping of her hamstrings.  The posterior thigh pain is more bothersome than hip pain. She denies any numbness or tingling in her left lower extremity.  Objective:   VITALS:   Vitals:   02/23/18 1616 02/23/18 1630 02/23/18 1714 02/23/18 1805  BP: 116/66 (!) 116/50 (!) 119/52 (!) 107/45  Pulse: 80 85 78 (!) 36  Resp: 13 12 18 18   Temp:  97.9 F (36.6 C) 98.1 F (36.7 C) 98.1 F (36.7 C)  TempSrc:  Oral Oral Oral  SpO2: 94% 94% 90% (!) 80%  Weight:      Height:        PHYSICAL EXAM: Eft lower extremity: Neurovascular intact Sensation intact distally Intact pulses distally Dorsiflexion/Plantar flexion intact Incision: dressing C/D/I No cellulitis present Compartment soft  LABS  Results for orders placed or performed during the hospital encounter of 02/22/18 (from the past 24 hour(s))  Surgical pcr screen     Status: None   Collection Time: 02/22/18 10:39 PM  Result Value Ref Range   MRSA, PCR NEGATIVE NEGATIVE   Staphylococcus aureus NEGATIVE NEGATIVE    Dg Chest Port 1 View  Result Date: 02/22/2018 CLINICAL DATA:  [place] [address] down steps.  Left hip pain EXAM: PORTABLE CHEST 1 VIEW COMPARISON:  06/02/2017 FINDINGS: Bibasilar airspace disease unchanged, probable scarring. There may also be a component of atelectasis. Negative for heart failure or pneumonia.  No interval change IMPRESSION: Bibasilar airspace disease unchanged and appearing to represent chronic scarring. No acute abnormality. Electronically Signed   By: Franchot Gallo M.D.   On: 02/22/2018 16:37   Dg Hip Port Unilat With Pelvis 1v Left  Result Date: 02/23/2018 CLINICAL DATA:  72 year old female with left subcapital femur fracture. Subsequent encounter. EXAM: DG HIP (WITH OR WITHOUT PELVIS) 1V PORT LEFT COMPARISON:  02/22/2018 x-ray. FINDINGS: Post total left hip replacement which appears in satisfactory position without  complication noted. Vascular groove proximal to mid femoral shaft similar to preoperative exam. IMPRESSION: Post left hip replacement. Electronically Signed   By: Genia Del M.D.   On: 02/23/2018 15:05   Dg Hip Unilat With Pelvis 2-3 Views Left  Result Date: 02/22/2018 CLINICAL DATA:  Fall.  Hip pain EXAM: DG HIP (WITH OR WITHOUT PELVIS) 2-3V LEFT COMPARISON:  None. FINDINGS: Subcapital femur fracture on the left with mild displacement and impaction. Left hip joint normal. IMPRESSION: Subcapital fracture left femur Electronically Signed   By: Franchot Gallo M.D.   On: 02/22/2018 16:33    Assessment/Plan: Day of Surgery   Active Problems:   Hip fracture [facility])  Patient's postoperative x-ray shows the implant well positioned.  Dressing is clean dry and intact.  Patient will receive 24 hours postop antibiotics. Her Foley catheter will be removed in the morning. Labs will be checked in the a.m.  Patient will begin physical therapy tomorrow. She will start Lovenox for DVT prophylaxis tomorrow as well.     Thornton Park , MD 02/23/2018, 6:20 PM

## 2018-02-23 NOTE — Clinical Social Work Note (Addendum)
Clinical Social Work Assessment  Patient Details  Name: Erin [name] MRN: [id number] Date of Birth: 1946-10-21  Date of referral:  02/23/18               Reason for consult:  Facility Placement                Permission sought to share information with:  Chartered certified accountant granted to share information::  Yes, Verbal Permission Granted  Name::      [place]::   [place]  Relationship::     Contact Information:     Housing/Transportation Living arrangements for the past 2 months:  [place] of Information:  Patient Patient Interpreter Needed:  None Criminal Activity/Legal Involvement Pertinent to Current Situation/Hospitalization:  No - Comment as needed Significant Relationships:  Adult Children, [place], Other Family Members Lives with:  Self Do you feel safe going back to the place where you live?  Yes Need for family participation in patient care:  Yes (Comment)  Care giving concerns: Patient lives alone in [place], [place].   Social Worker assessment / plan: Holiday representative (CSW) reviewed patient's chart and noted that patient has a hip fracture. Surgery and PT are pending. Social work Theatre manager met with patient, son Stormy Card [phone]), and other family/church members at bedside. Patient was laying in bed alert and oriented x4. Social work Theatre manager introduced self and explained the role of the [place]. Patient lives alone in [place] and pastor Verdia Kuba [phone]) is her HPOA. Social work Theatre manager explained that surgery and PT are pending. Social work Theatre manager explained that PT will evaluate patient after surgery and make a recommendation of Home Health or SNF placement. Social work Theatre manager also explained that medicare requires a 3 night qualifying inpatient stay in the hospital in order to pay for SNF. Patient was admitted to inpatient on 02/22/18. Patient verbalized her understanding and is  interested in SNF placement. Patient prefers [facility] with a private room or [facility]. FL2 completed and faxed out. CSW and social work Theatre manager will continue to follow up and assist.  Social work Theatre manager presented bed offers to patient and family and they chose [facility]. Per [facility] admissions coordinator at [facility] she is confident that patient will have a private room.   Employment status:  Retired Forensic scientist:  Medicare PT Recommendations:  Not assessed at this time Information / Referral to community resources:     Patient/Family's Response to care: Patient accepted bed offer from [facility].   Patient/Family's Understanding of and Emotional Response to Diagnosis, Current Treatment, and Prognosis: Patient and family members were pleasant and thanked social work Theatre manager for her assistance.  Emotional Assessment Appearance:  Appears stated age Attitude/Demeanor/Rapport:    Affect (typically observed):  Accepting, Calm, Pleasant Orientation:  Oriented to Self, Oriented to Place, Oriented to  Time, Oriented to Situation Alcohol / Substance use:  Not Applicable Psych involvement (Current and /or in the community):  No (Comment)  Discharge Needs  Concerns to be addressed:  Care Coordination, Discharge Planning Concerns Readmission within the last 30 days:  No Current discharge risk:  Dependent with Mobility Barriers to Discharge:  Continued Medical Work up   Smith Mince, Student-Social Work 02/23/2018, 9:56 AM

## 2018-02-23 NOTE — Anesthesia Procedure Notes (Signed)
Date/Time: 02/23/2018 11:26 AM Performed by: Johnna Acosta, CRNA Pre-anesthesia Checklist: Patient identified, Suction available, Emergency Drugs available, Patient being monitored and Timeout performed Patient Re-evaluated:Patient Re-evaluated prior to induction Oxygen Delivery Method: Simple face mask Preoxygenation: Pre-oxygenation with 100% oxygen

## 2018-02-23 NOTE — Op Note (Signed)
02/23/2018  2:31 PM  PATIENT:  Erin [name]   MRN: [id number]  PRE-OPERATIVE DIAGNOSIS:  LEFT FEMORAL NECK HIP FRACTURE  POST-OPERATIVE DIAGNOSIS:  SAME  PROCEDURE:  Procedure(s): LEFT HIP HEMIARTHROPLASTY   PREOPERATIVE INDICATIONS:    Erin [name] is an 72 y.o. female who was admitted with a diagnosis of LEFT HIP FRACTURE.  I have recommended surgical fixation with hemiarthroplasty for this injury. I have explained the surgery and the postoperative course to the patient and their family and friends who have agreed with surgical management of this fracture.    The risks benefits and alternatives were discussed with the patient and their family including but not limited to the risks of  infection requiring removal of the prosthesis, bleeding requiring blood transfusion, nerve injury especially to the sciatic nerve leading to foot drop or lower extremity numbness, periprosthetic fracture, dislocation leg length discrepancy, change in lower extremity rotation persistent hip pain, loosening or failure of the components and the need for revision surgery. Medical risks include but are not limited to DVT and pulmonary embolism, myocardial infarction, stroke, pneumonia, respiratory failure and death.  OPERATIVE REPORT     SURGEON:  Thornton Park, MD    ASSISTANT:  Wyatt Portela, PA    ANESTHESIA:  Spinal    COMPLICATIONS:  None.   SPECIMEN: Femoral head to pathology    COMPONENTS:  Stryker Accolade 2 femoral component size 5 with a Stryker Unitrax 47 mm and neutral (+0)  neck adjustment sleeve.    PROCEDURE IN DETAIL:   The patient was met in the holding area and  identified.  The appropriate hip was identified and marked at the operative site after verbally confirming with the patient that this was the correct site of surgery.  The patient was then transported to the OR  and  underwent spinal anesthesia.  The patient was then placed in the lateral decubitus position with the operative  side up and secured on the operating room table with a pegboard and all bony prominences were adequately padded. This included an axillary roll and additional padding around the nonoperative leg to prevent compression to the common peroneal nerve.    The operative lower extremity was prepped and draped in a sterile fashion.  A time out was performed prior to incision to verify patient's name, date of birth, medical record number, correct site of surgery correct procedure to be performed. The timeout was also used to verify the patient received antibiotics now appropriate instruments, implants and radiographic studies were available in the room. Once all in attendance were in agreement case began.    A posterolateral approach was utilized via sharp dissection  carried down to the subcutaneous tissue.  Bleeding vessels were coagulated using electrocautery.  The fascia lata was identified and incised along the length of the skin incision.  The gluteus maximus muscle was then split in line with its fibers. Self-retaining retractors were  inserted.  With the hip internally rotated, the short external rotators  were identified and removed from the posterior attachment from the greater trochanter. The piriformis was tagged for later repair. The capsule was identified and a T-shaped capsulotomy was performed. The capsule was tagged with #2 Tycron for later repair.  The femoral neck fracture was exposed, and the femoral head was removed using a corkscrew device. This was measured to be 47 mm in diameter. The attention was then turned to proximal femur preparation.  An oscillating saw was used to perform a proximal femoral  osteotomy 1 fingerbreadth above the lesser trochanter. The trial 47 mm femoral head was placed into the acetabulum and had an excellent suction fit. The attention was then turned back to femoral preparation.    A femoral skid and Cobra retractor were placed under the femoral neck to allow for  adequate visualization. A box osteotome was used to make the initial entry into the proximal femur. A single hand reamer was used to prepare the femoral canal. A T-shaped femoral canal sounder was then used to ensure no penetration femoral cortex had occurred during reaming. The proximal femur was then sequentially broached by hand. A size 5 femoral trial broach was found to have best medial to lateral canal fit. Once adequate mediolateral canal fill was achieved the trial femoral broach, neck, and head was assembled and the hip was reduced. It was found to have excellent stability, equivalent leg lengths with functional range of motion. The trial components were then removed.  I copiously irrigated the femoral canal and then impacted the real femoral prosthesis into place into the appropriate version, slightly anteverted to the normal anatomy, and I impacted the actual 47 mm Unitrax femoral component with a +0 neck adjustment sleeve into place. The hip was then reduced and taken through functional range of motion and found to have excellent stability. Leg lengths were restored. The hip joint was copiously irrigated.   A soft tissue repair of the capsule and external rotators was performed using #2 Tycron Excellent posterior capsular repair was achieved. The fascia lata was then closed with interrupted 0 Vicryl suture. The subcutaneous tissues were closed with 2-0 Vicryl and the skin approximated with staples.   The patient was then placed supine on the operative table. Leg lengths were checked clinically and found to be equivalent. An abduction pillow was placed between the lower extremities. The patient was then transferred to a hospital bed and brought to the PACU in stable condition. I was scrubbed and present the entire case and all sharp and instrument counts were correct at the conclusion of the case. I spoke with the patient's family in the postop consultation room to let them know the case was  completed without complication patient was stable in recovery room.   Timoteo Gaul, MD Orthopedic Surgeon

## 2018-02-23 NOTE — NC FL2 (Signed)
[place] LEVEL OF CARE SCREENING TOOL     IDENTIFICATION  Patient Name: Erin [name] Birthdate: 02/23/1946 Sex: female Admission Date (Current Location): 02/22/2018  [place] and [place] Number:  Engineering geologist and Address:  [facility], [address] [place], [place] [address]      Provider Number: [id number]  Attending Physician Name and Address:  Max Sane, MD  Relative Name and Phone Number:       Current Level of Care: Hospital Recommended Level of Care: [place] Prior Approval Number:    Date Approved/Denied:   PASRR Number: ([id number])  Discharge Plan: SNF    Current Diagnoses: Patient Active Problem List   Diagnosis Date Noted  . Hip fracture ([place]) 02/22/2018  . Hyperlipidemia 10/26/2017  . [name] (coronary artery disease), native coronary artery 10/24/2017  . Vulvar irritation 09/02/2017  . 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 Screening for cardiovascular condition 12/15/2016  . Shortness of breath 12/15/2016  . 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 Scleroderma ([place]) 12/15/2016  . Raynaud's disease without gangrene 12/15/2016  . Melanoma of skin ([place]) 11/11/2016  . Lesion of labia 11/11/2016    Orientation RESPIRATION BLADDER Height & Weight     Self, Time, Situation, Place  Normal Continent Weight: 190 lb (86.2 kg) Height:  5\' 2"  (157.5 cm)  BEHAVIORAL SYMPTOMS/MOOD NEUROLOGICAL BOWEL NUTRITION STATUS      Continent Diet(NPO to be advanced)  AMBULATORY STATUS COMMUNICATION OF NEEDS Skin   Extensive Assist Verbally Normal                       Personal Care Assistance Level of Assistance  Bathing, Feeding, Dressing Bathing Assistance: Limited assistance Feeding assistance: Independent Dressing Assistance: Limited assistance     Functional Limitations Info   Sight, Hearing, Speech Sight Info: Adequate Hearing Info: Adequate Speech Info: Adequate    SPECIAL CARE FACTORS FREQUENCY  PT (By licensed PT), OT (By licensed OT)     PT Frequency: (5) OT Frequency: (5)            Contractures      Additional Factors Info  Code Status, Allergies Code Status Info: (Full Code) Allergies Info: (REMICADE INFLIXIMAB, SULFA ANTIBIOTICS, ACTONEL RISEDRONATE SODIUM, FOSAMAX ALENDRONATE SODIUM, PROCARDIA NIFEDIPINE, SULFUR, WELLBUTRIN BUPROPION )           Current Medications (02/23/2018):  This is the current hospital active medication list Current Facility-Administered Medications  Medication Dose Route Frequency Provider Last Rate Last Dose  . 0.9 %  sodium chloride infusion   Intravenous Continuous Fritzi Mandes, MD 75 mL/hr at 02/23/18 0548 75 mL/hr at 02/23/18 0548  . acetaminophen (TYLENOL) tablet 650 mg  650 mg Oral Q6H PRN Fritzi Mandes, MD   650 mg at 02/23/18 0123   Or  . acetaminophen (TYLENOL) suppository 650 mg  650 mg Rectal Q6H PRN Fritzi Mandes, MD      . albuterol (PROVENTIL) (2.5 MG/3ML) 0.083% nebulizer solution 2.5 mg  2.5 mg Nebulization Q2H PRN Fritzi Mandes, MD      . ALPRAZolam Duanne Moron) tablet 0.5 mg  0.5 mg Oral QID Fritzi Mandes, MD   0.5 mg at 02/22/18 2225  . amLODipine (NORVASC) tablet 10 mg  10 mg Oral Daily Fritzi Mandes, MD      . atorvastatin (LIPITOR) tablet 40 mg  40 mg Oral q1800 Posey Pronto,  Sona, MD      . ceFAZolin (ANCEF) IVPB 2g/100 mL premix  2 g Intravenous 30 min Pre-Op Thornton Park, MD      . docusate sodium (COLACE) capsule 100 mg  100 mg Oral BID Fritzi Mandes, MD   100 mg at 02/22/18 2225  . ezetimibe (ZETIA) tablet 10 mg  10 mg Oral Daily Fritzi Mandes, MD      . FLUoxetine (PROZAC) capsule 20 mg  20 mg Oral Daily Fritzi Mandes, MD      . gabapentin (NEURONTIN) capsule 300 mg  300 mg Oral TID Fritzi Mandes, MD   300 mg at 02/22/18 2225  . hydroxychloroquine (PLAQUENIL) tablet 200 mg  200 mg Oral BID Fritzi Mandes, MD    200 mg at 02/22/18 2225  . imipramine (TOFRANIL) tablet 25 mg  25 mg Oral BID Fritzi Mandes, MD   25 mg at 02/22/18 2225  . latanoprost (XALATAN) 0.005 % ophthalmic solution 1 drop  1 drop Both Eyes QHS Fritzi Mandes, MD   1 drop at 02/22/18 2226  . multivitamin with minerals tablet 1 tablet  1 tablet Oral Daily Fritzi Mandes, MD      . omega-3 acid ethyl esters (LOVAZA) capsule 1 g  1 capsule Oral BID Fritzi Mandes, MD      . ondansetron [facility]) tablet 4 mg  4 mg Oral Q6H PRN Fritzi Mandes, MD       Or  . ondansetron [facility]) injection 4 mg  4 mg Intravenous Q6H PRN Fritzi Mandes, MD      . oxyCODONE (Oxy IR/ROXICODONE) immediate release tablet 5 mg  5 mg Oral Q4H PRN Fritzi Mandes, MD   5 mg at 02/23/18 0530  . polyethylene glycol (MIRALAX / GLYCOLAX) packet 17 g  17 g Oral Daily PRN Fritzi Mandes, MD      . polyethylene glycol (MIRALAX / GLYCOLAX) packet 17 g  17 g Oral Daily Fritzi Mandes, MD      . predniSONE (DELTASONE) tablet 5 mg  5 mg Oral BID WC Fritzi Mandes, MD      . traMADol Veatrice Bourbon) tablet 50 mg  50 mg Oral Q12H PRN Fritzi Mandes, MD   50 mg at 02/22/18 2026  . zolpidem (AMBIEN) tablet 5 mg  5 mg Oral QHS PRN Fritzi Mandes, MD   5 mg at 02/22/18 2225     Discharge Medications: Please see discharge summary for a list of discharge medications.  Relevant Imaging Results:  Relevant Lab Results:   Additional Information (SSN: [id number])  Smith Mince, Student-Social Work

## 2018-02-23 NOTE — Progress Notes (Signed)
[facility] [place] NAME: Erin [name]    MR#:  [id number]  DATE OF BIRTH:  1946-03-23  SUBJECTIVE:  CHIEF COMPLAINT:   Chief Complaint  Patient presents with  . Fall  in pain, waiting fro surgery when I saw her, all family at bedside REVIEW OF SYSTEMS:  Review of Systems  Constitutional: Negative for chills, fever and weight loss.  HENT: Negative for nosebleeds and sore throat.   Eyes: Negative for blurred vision.  Respiratory: Negative for cough, shortness of breath and wheezing.   Cardiovascular: Negative for chest pain, orthopnea, leg swelling and PND.  Gastrointestinal: Negative for abdominal pain, constipation, diarrhea, heartburn, nausea and vomiting.  Genitourinary: Negative for dysuria and urgency.  Musculoskeletal: Positive for joint pain. Negative for back pain.  Skin: Negative for rash.  Neurological: Negative for dizziness, speech change, focal weakness and headaches.  Endo/Heme/Allergies: Does not bruise/bleed easily.  Psychiatric/Behavioral: Negative for depression.   DRUG ALLERGIES:   Allergies  Allergen Reactions  . Remicade [Infliximab] Shortness Of Breath and Itching  . Sulfa Antibiotics Other (See Comments)    Other Reaction: tounge cracked  . Actonel [Risedronate Sodium] Other (See Comments)  . Fosamax [Alendronate Sodium]     GI Bleed   . Procardia [Nifedipine] Hives  . Sulfur Swelling  . Wellbutrin [Bupropion] Anxiety   VITALS:  Blood pressure (!) 107/52, pulse 88, temperature 97.6 F (36.4 C), temperature source Oral, resp. rate 17, height 5\' 2"  (1.575 m), weight 86.2 kg (190 lb), SpO2 94 %. PHYSICAL EXAMINATION:  Physical Exam  Constitutional: She is oriented to person, place, and time and well-developed, well-nourished, and in no distress.  HENT:  Head: Normocephalic and atraumatic.  Eyes: Conjunctivae and EOM are normal. Pupils are equal, round, and reactive to light.  Neck: Normal range of  motion. Neck supple. No tracheal deviation present. No thyromegaly present.  Cardiovascular: Normal rate, regular rhythm and normal heart sounds.  Pulmonary/Chest: Effort normal and breath sounds normal. No respiratory distress. She has no wheezes. She exhibits no tenderness.  Abdominal: Soft. Bowel sounds are normal. She exhibits no distension. There is no tenderness.  Musculoskeletal:       Left hip: She exhibits decreased range of motion, tenderness and bony tenderness.  shortening and external rotation to the left lower extremity  Neurological: She is alert and oriented to person, place, and time. No cranial nerve deficit.  Skin: Skin is warm and dry. No rash noted.  Psychiatric: Mood and affect normal.   LABORATORY PANEL:  Female CBC Recent Labs  Lab 02/22/18 1552  WBC 11.1*  HGB 10.7*  HCT 33.8*  PLT 291   ------------------------------------------------------------------------------------------------------------------ Chemistries  Recent Labs  Lab 02/22/18 1552  NA 130*  K 4.3  CL 98*  CO2 23  GLUCOSE 112*  BUN 25*  CREATININE 0.92  CALCIUM 8.2*  AST 32  ALT 29  ALKPHOS 88  BILITOT 0.6   RADIOLOGY:  Dg Hip Port Unilat With Pelvis 1v Left  Result Date: 02/23/2018 CLINICAL DATA:  72 year old female with left subcapital femur fracture. Subsequent encounter. EXAM: DG HIP (WITH OR WITHOUT PELVIS) 1V PORT LEFT COMPARISON:  02/22/2018 x-ray. FINDINGS: Post total left hip replacement which appears in satisfactory position without complication noted. Vascular groove proximal to mid femoral shaft similar to preoperative exam. IMPRESSION: Post left hip replacement. Electronically Signed   By: Genia Del M.D.   On: 02/23/2018 15:05   ASSESSMENT AND PLAN:  Erin [name]  is a 72 y.o. female with a known history of rheumatoid arthritis, [name], hypertension, [name], hyperlipidemia comes to the emergency room after patient was ambulating at home with a cane and  trying to get down the steps missed this last step at home had a mechanical fall and started having left hip pain.  1.  Preop medical clearance for Acute left femoral subcapital fracture status post mechanical fall at home - at a moderate/intermediate risk given her multiple medical problems -Incentive spirometer, PRN nebs, as needed oxygen.  Current sats are more than 95% on room air  2.  History of chronic interstitial fibrosis in the setting of rheumatoid arthritis and scleroderma -Closed with pulmonary as outpatient. -Pulmonary function test in 2018 showed no obstruction -Previous chest x-ray shows bibasilar interstitial fibrosis/scarring -Sats are stable. -Continue pulmonary toilet/rehab perioperative  3.  History of [name] -Stress test in 2018 unremarkable -Follows with Dr. Rockey Situ -Continue cardiac meds  4.  History of Raynouds, scleroderma, rheumatoid arthritis -Continue prednisone and Plaquenil       All the records are reviewed and case discussed with Care Management/Social Worker. Management plans discussed with the patient, family and they are in agreement.  CODE STATUS: Full Code  TOTAL TIME TAKING CARE OF THIS PATIENT:25 minutes.   More than 50% of the time was spent in counseling/coordination of care: YES  POSSIBLE D/C IN 2-3 DAYS, DEPENDING ON CLINICAL CONDITION.   Max Sane M.D on 02/23/2018 at 8:58 PM  Between 7am to 6pm - Pager - [phone]  After 6pm go to [email or web address] - Proofreader  [facility]  Office  [phone]  CC: Primary care physician; Idelle Crouch, MD  Note: This dictation was prepared with Dragon dictation along with smaller phrase technology. Any transcriptional errors that result from this process are unintentional.

## 2018-02-23 NOTE — Anesthesia Post-op Follow-up Note (Signed)
Anesthesia QCDR form completed.        

## 2018-02-23 NOTE — Anesthesia Preprocedure Evaluation (Signed)
Anesthesia Evaluation  Patient identified by MRN, date of birth, ID band Patient awake    Reviewed: Allergy & Precautions, NPO status , Patient's Chart, lab work & pertinent test results  History of Anesthesia Complications Negative for: history of anesthetic complications  Airway Mallampati: II  TM Distance: >3 FB Neck ROM: Full    Dental  (+) Upper Dentures, Lower Dentures   Pulmonary shortness of breath, neg sleep apnea, neg COPD,    breath sounds clear to auscultation- rhonchi (-) wheezing      Cardiovascular hypertension, + [name] (nonocclusive)  (-) Past MI, (-) Cardiac Stents and (-) CABG + Valvular Problems/Murmurs  Rhythm:Regular Rate:Normal - Systolic murmurs and - Diastolic murmurs Echo 43/15/40: NORMAL LEFT VENTRICULAR SYSTOLIC FUNCTION NORMAL RIGHT VENTRICULAR SYSTOLIC FUNCTION MILD VALVULAR REGURGITATION (mild TR, trivial MR) NO VALVULAR STENOSIS   Neuro/Psych negative neurological ROS  negative psych ROS   GI/Hepatic Neg liver ROS, GERD  ,  Endo/Other  negative endocrine ROSneg diabetes  Renal/GU negative Renal ROS     Musculoskeletal  (+) Arthritis ,   Abdominal (+) + obese,   Peds  Hematology  (+) anemia ,   Anesthesia Other Findings Past Medical History: No date: Anemia     Comment: h/o No date: Cancer ([place])     Comment: melanoma No date: Coronary artery disease No date: DDD (degenerative disc disease), lumbar No date: Dysphagia No date: Dyspnea     Comment: with exertion-unable to walk a mile without               getting sob- dr sparks set pt up to see Dr               Rockey Situ after 11-19-16 surgery No date: Esophageal dysmotility No date: Fibrocystic breast disease No date: GERD (gastroesophageal reflux disease) No date: Heart murmur     Comment: h/o  No date: Hyperlipidemia No date: Hypertension No date: Mitral regurgitation No date: Osteopenia No date: Pulmonary fibrosis (HCC)   Comment: per Dr Raul Del No date: RA (rheumatoid arthritis) ([place]) No date: Raynaud's disease No date: Scleroderma ([place])   Reproductive/Obstetrics                             Anesthesia Physical  Anesthesia Plan  ASA: III  Anesthesia Plan: Spinal   Post-op Pain Management:    Induction:   PONV Risk Score and Plan: 2 and Propofol infusion  Airway Management Planned: Natural Airway and Simple Face Mask  Additional Equipment:   Intra-op Plan:   Post-operative Plan:   Informed Consent: I have reviewed the patients History and Physical, chart, labs and discussed the procedure including the risks, benefits and alternatives for the proposed anesthesia with the patient or authorized representative who has indicated his/her understanding and acceptance.   Dental advisory given  Plan Discussed with: CRNA and Anesthesiologist  Anesthesia Plan Comments:         Anesthesia Quick Evaluation

## 2018-02-23 NOTE — Progress Notes (Signed)
Subjective:  Patient was seen in the preoperative area. Her family is at the bedside along with her pastor. Patient reports left hip pain as marked.    Objective:   VITALS:   Vitals:   02/22/18 1823 02/22/18 2008 02/23/18 0730 02/23/18 1026  BP: (!) 119/51 (!) 106/57 (!) 134/52 (!) 120/58  Pulse: (!) 103 77 89 83  Resp: 18 16 16    Temp: (!) 97.2 F (36.2 C) 97.6 F (36.4 C) 98.3 F (36.8 C) (!) 97.3 F (36.3 C)  TempSrc: Axillary Oral Oral Tympanic  SpO2: 99%  99% 93%  Weight:    86.2 kg (190 lb)  Height:    5\' 2"  (1.575 m)    PHYSICAL EXAM: Left lower extremity: Neurovascular intact Sensation intact distally Intact pulses distally Dorsiflexion/Plantar flexion intact No cellulitis present Compartment soft  LABS  Results for orders placed or performed during the hospital encounter of 02/22/18 (from the past 24 hour(s))  Comprehensive metabolic panel     Status: Abnormal   Collection Time: 02/22/18  3:52 PM  Result Value Ref Range   Sodium 130 (L) 135 - 145 mmol/L   Potassium 4.3 3.5 - 5.1 mmol/L   Chloride 98 (L) 101 - 111 mmol/L   CO2 23 22 - 32 mmol/L   Glucose, Bld 112 (H) 65 - 99 mg/dL   BUN 25 (H) 6 - 20 mg/dL   Creatinine, Ser 0.92 0.44 - 1.00 mg/dL   Calcium 8.2 (L) 8.9 - 10.3 mg/dL   Total Protein 6.4 (L) 6.5 - 8.1 g/dL   Albumin 3.4 (L) 3.5 - 5.0 g/dL   AST 32 15 - 41 U/L   ALT 29 14 - 54 U/L   Alkaline Phosphatase 88 38 - 126 U/L   Total Bilirubin 0.6 0.3 - 1.2 mg/dL   GFR calc non Af Amer >60 >60 mL/min   GFR calc Af Amer >60 >60 mL/min   Anion gap 9 5 - 15  Troponin I     Status: None   Collection Time: 02/22/18  3:52 PM  Result Value Ref Range   Troponin I <0.03 <0.03 ng/mL  CBC with Differential     Status: Abnormal   Collection Time: 02/22/18  3:52 PM  Result Value Ref Range   WBC 11.1 (H) 3.6 - 11.0 K/uL   RBC 4.05 3.80 - 5.20 MIL/uL   Hemoglobin 10.7 (L) 12.0 - 16.0 g/dL   HCT 33.8 (L) 35.0 - 47.0 %   MCV 83.7 80.0 - 100.0 fL   MCH  26.4 26.0 - 34.0 pg   MCHC 31.6 (L) 32.0 - 36.0 g/dL   RDW 17.0 (H) 11.5 - 14.5 %   Platelets 291 150 - 440 K/uL   Neutrophils Relative % 84 %   Neutro Abs 9.4 (H) 1.4 - 6.5 K/uL   Lymphocytes Relative 8 %   Lymphs Abs 0.8 (L) 1.0 - 3.6 K/uL   Monocytes Relative 7 %   Monocytes Absolute 0.8 0.2 - 0.9 K/uL   Eosinophils Relative 0 %   Eosinophils Absolute 0.0 0 - 0.7 K/uL   Basophils Relative 1 %   Basophils Absolute 0.1 0 - 0.1 K/uL  Type and screen [facility]     Status: None   Collection Time: 02/22/18  3:52 PM  Result Value Ref Range   ABO/RH(D) O POS    Antibody Screen NEG    Sample Expiration      02/25/2018 Performed at [facility], [address] [place], [place] [address]   Protime-INR     Status: None   Collection Time: 02/22/18  3:52 PM  Result Value Ref Range   Prothrombin Time 12.0 11.4 - 15.2 seconds   INR 0.89   Surgical pcr screen     Status: None   Collection Time: 02/22/18 10:39 PM  Result Value Ref Range   MRSA, PCR NEGATIVE NEGATIVE   Staphylococcus aureus NEGATIVE NEGATIVE    Dg Chest Port 1 View  Result Date: 02/22/2018 CLINICAL DATA:  [place] down steps.  Left hip pain EXAM: PORTABLE CHEST 1 VIEW COMPARISON:  06/02/2017 FINDINGS: Bibasilar airspace disease unchanged, probable scarring. There may also be a component of atelectasis. Negative for heart failure or pneumonia.  No interval change IMPRESSION: Bibasilar airspace disease unchanged and appearing to represent chronic scarring. No acute abnormality. Electronically Signed   By: Franchot Gallo M.D.   On: 02/22/2018 16:37   Dg Hip Unilat With Pelvis 2-3 Views Left  Result Date: 02/22/2018 CLINICAL DATA:  Fall.  Hip pain EXAM: DG HIP (WITH OR WITHOUT PELVIS) 2-3V LEFT COMPARISON:  None. FINDINGS: Subcapital femur fracture on the left with mild displacement and impaction. Left hip joint normal. IMPRESSION: Subcapital fracture left femur Electronically Signed   By: Franchot Gallo M.D.   On: 02/22/2018 16:33    Assessment/Plan: Day of Surgery   Active Problems:   Hip fracture [facility])  Patient is been cleared for surgery by the hospitalist service. She has been nothing by mouth after midnight.  Patient scheduled for hemiarthroplasty today.    Thornton Park , MD 02/23/2018, 11:27 AM

## 2018-02-23 NOTE — Anesthesia Procedure Notes (Signed)
Spinal  Patient location during procedure: OR Start time: 02/23/2018 11:46 AM End time: 02/23/2018 11:51 AM Staffing Anesthesiologist: Martha Clan, MD Performed: anesthesiologist  Preanesthetic Checklist Completed: patient identified, site marked, surgical consent, pre-op evaluation, timeout performed, IV checked, risks and benefits discussed and monitors and equipment checked Spinal Block Patient position: left lateral decubitus Prep: ChloraPrep Patient monitoring: heart rate, continuous pulse ox, blood pressure and cardiac monitor Approach: midline Location: L3-4 Injection technique: single-shot Needle Needle type: Whitacre and Introducer  Needle gauge: 24 G Needle length: 9 cm Assessment Sensory level: T10 Additional Notes Negative paresthesia. Negative blood return. Positive free-flowing CSF. Expiration date of kit checked and confirmed. Patient tolerated procedure well, without complications.

## 2018-02-23 NOTE — Progress Notes (Signed)
Low sats on nasal cannula duo neb given

## 2018-02-23 NOTE — Progress Notes (Signed)
ANTICOAGULATION CONSULT NOTE - Initial Consult  Pharmacy Consult for enoxaparin Indication: VTE Prophylaxis post hip arthroplasty  Allergies  Allergen Reactions  . Remicade [Infliximab] Shortness Of Breath and Itching  . Sulfa Antibiotics Other (See Comments)    Other Reaction: tounge cracked  . Actonel [Risedronate Sodium] Other (See Comments)  . Fosamax [Alendronate Sodium]     GI Bleed   . Procardia [Nifedipine] Hives  . Sulfur Swelling  . Wellbutrin [Bupropion] Anxiety    Patient Measurements: Height: 5\' 2"  (157.5 cm) Weight: 190 lb (86.2 kg) IBW/kg (Calculated) : 50.1 Heparin Dosing Weight:   Vital Signs: Temp: 97.9 F (36.6 C) (03/19 1630) Temp Source: Oral (03/19 1630) BP: 116/50 (03/19 1630) Pulse Rate: 85 (03/19 1630)  Labs: Recent Labs    02/22/18 1552  HGB 10.7*  HCT 33.8*  PLT 291  LABPROT 12.0  INR 0.89  CREATININE 0.92  TROPONINI <0.03    Estimated Creatinine Clearance: 55.5 mL/min (by C-G formula based on SCr of 0.92 mg/dL).   Medical History: Past Medical History:  Diagnosis Date  . Anemia    h/o  . Cancer ([place])    melanoma x 2 the last one was of her leg and surgeon removed it  . Coronary artery disease   . DDD (degenerative disc disease), lumbar   . Dysphagia   . Dyspnea    with exertion-unable to walk a mile without getting sob- dr sparks set pt up to see Dr Rockey Situ after 11-19-16 surgery  . Esophageal dysmotility   . Fibrocystic breast disease   . GERD (gastroesophageal reflux disease)   . Heart murmur    h/o   . Hyperlipidemia   . Hypertension   . Mitral regurgitation   . Osteopenia   . Pulmonary fibrosis ([place])    per Dr Raul Del  . RA (rheumatoid arthritis) ([facility])   . Raynaud's disease   . Scleroderma ([place])   . Vulvar dysplasia     Medications:  Infusions:  . sodium chloride 75 mL/hr (02/23/18 0548)  . 0.9 % NaCl with KCl 20 mEq / L    .  ceFAZolin (ANCEF) IV    . lactated ringers 50 mL/hr at 02/23/18 1046  .  methocarbamol (ROBAXIN)  IV      Assessment: 84 yof with left femoral neck hip fracture now s/p left hip hemiarthroplasty. Surgeon ordered LMWH 40 subcutaneously daily to start tomorrow at 08:00 which we will continue. No PTA OAC noted. Baseline labs noted from 02/22/18 include SCr 0.92 mg/dL, Hgb 10.7, plt 291. BMP and CBC are ordered by provider so we will follow up on those.  Goal of Therapy:  Prevent VTE post surgery Monitor platelets by anticoagulation protocol: Yes   Plan:  Enoxaparin 40 mg subcutaneously once daily. Will follow labs as ordered.  Laural Benes, Pharm.D., BCPS Clinical Pharmacist 02/23/2018,5:03 PM

## 2018-02-23 NOTE — Clinical Social Work Placement (Signed)
   CLINICAL SOCIAL WORK PLACEMENT  NOTE  Date:  02/23/2018  Patient Details  Name: Erin [name] MRN: [id number] Date of Birth: Apr 16, 1946  Clinical Social Work is seeking post-discharge placement for this patient at the [place] level of care (*CSW will initial, date and re-position this form in  chart as items are completed):  Yes   Patient/family provided with [facility] Work Department's list of facilities offering this level of care within the geographic area requested by the patient (or if unable, by the patient's family).  Yes   Patient/family informed of their freedom to choose among providers that offer the needed level of care, that participate in Medicare, Medicaid or managed care program needed by the patient, have an available bed and are willing to accept the patient.  Yes   Patient/family informed of [facility]'s ownership interest in [facility] and [facility], as well as of the fact that they are under no obligation to receive care at these facilities.  PASRR submitted to EDS on 02/23/18     PASRR number received on 02/23/18     Existing PASRR number confirmed on       FL2 transmitted to all facilities in geographic area requested by pt/family on 02/23/18     FL2 transmitted to all facilities within larger geographic area on       Patient informed that his/her managed care company has contracts with or will negotiate with certain facilities, including the following:        Yes   Patient/family informed of bed offers received.  Patient chooses bed at [facility] )     Physician recommends and patient chooses bed at      Patient to be transferred to   on  .  Patient to be transferred to facility by       Patient family notified on   of transfer.  Name of family member notified:        PHYSICIAN       Additional Comment:    _______________________________________________ Tobby Fawcett, Veronia Beets, LCSW 02/23/2018,  12:01 PM

## 2018-02-23 NOTE — Transfer of Care (Signed)
Immediate Anesthesia Transfer of Care Note  Patient: Erin [name]  Procedure(s) Performed: ARTHROPLASTY BIPOLAR HIP (HEMIARTHROPLASTY) (Left Hip)  Patient Location: PACU  Anesthesia Type:Spinal  Level of Consciousness: awake, alert  and oriented  Airway & Oxygen Therapy: Patient Spontanous Breathing and Patient connected to face mask oxygen  Post-op Assessment: Report given to RN and Post -op Vital signs reviewed and stable  Post vital signs: Reviewed and stable  Last Vitals:  Vitals:   02/23/18 1026 02/23/18 1429  BP: (!) 120/58 (!) 95/45  Pulse: 83 85  Resp:  (!) 21  Temp: (!) 36.3 C 36.9 C  SpO2: 93% 100%    Last Pain:  Vitals:   02/23/18 1429  TempSrc: Temporal  PainSc:          Complications: No apparent anesthesia complications

## 2018-02-24 ENCOUNTER — Encounter
Admission: RE | Admit: 2018-02-24 | Discharge: 2018-02-24 | Disposition: A | Discharge status: Home / Self Care | Payer: Medicare Other | Source: Ambulatory Visit | Attending: Internal Medicine | Admitting: Internal Medicine

## 2018-02-24 ENCOUNTER — Encounter: Payer: Self-pay | Admitting: Orthopedic Surgery

## 2018-02-24 LAB — CBC
HCT: 27.4 % — ABNORMAL LOW (ref 35.0–47.0)
Hemoglobin: 8.6 g/dL — ABNORMAL LOW (ref 12.0–16.0)
MCH: 26.5 pg (ref 26.0–34.0)
MCHC: 31.5 g/dL — ABNORMAL LOW (ref 32.0–36.0)
MCV: 84.2 fL (ref 80.0–100.0)
PLATELETS: 204 10*3/uL (ref 150–440)
RBC: 3.25 MIL/uL — AB (ref 3.80–5.20)
RDW: 16.8 % — ABNORMAL HIGH (ref 11.5–14.5)
WBC: 13.5 10*3/uL — AB (ref 3.6–11.0)

## 2018-02-24 LAB — BASIC METABOLIC PANEL
ANION GAP: 6 (ref 5–15)
BUN: 22 mg/dL — AB (ref 6–20)
CO2: 24 mmol/L (ref 22–32)
Calcium: 8 mg/dL — ABNORMAL LOW (ref 8.9–10.3)
Chloride: 102 mmol/L (ref 101–111)
Creatinine, Ser: 1.16 mg/dL — ABNORMAL HIGH (ref 0.44–1.00)
GFR, EST AFRICAN AMERICAN: 53 mL/min — AB (ref >60)
GFR, EST NON AFRICAN AMERICAN: 46 mL/min — AB (ref >60)
Glucose, Bld: 109 mg/dL — ABNORMAL HIGH (ref 65–99)
Potassium: 4.7 mmol/L (ref 3.5–5.1)
SODIUM: 132 mmol/L — AB (ref 135–145)

## 2018-02-24 MED ORDER — PANTOPRAZOLE SODIUM 40 MG PO TBEC
40.0000 mg | DELAYED_RELEASE_TABLET | Freq: Every day | ORAL | Status: DC
  Administered 2018-02-24 – 2018-02-25 (×2): 40 mg via ORAL
  Filled 2018-02-24 (×2): qty 1

## 2018-02-24 NOTE — Progress Notes (Signed)
Subjective:  POD #1 s/p left hip hemiarthroplasty.  Patient reports left hip pain as mild.  She is still having some hamstring cramping, but this is improving.   A friend is at the bedside.   She was able to stand with PT but doesn't feel she did very well with therapy and needed a lot of help.  Patient notes some slight wheezing or breathing.  Objective:   VITALS:   Vitals:   02/24/18 0516 02/24/18 0840 02/24/18 0855 02/24/18 1100  BP: (!) 118/52 (!) 139/58    Pulse: 96 92  96  Resp: 16 20    Temp: 98.4 F (36.9 C) 99.2 F (37.3 C)    TempSrc: Oral Axillary    SpO2: 95% (!) 87% 97% 97%  Weight:      Height:        PHYSICAL EXAM: Left lower extremity: Neurovascular intact Sensation intact distally Intact pulses distally Dorsiflexion/Plantar flexion intact No cellulitis present Compartment soft Dressing C/D/I   LABS  Results for orders placed or performed during the hospital encounter of 02/22/18 (from the past 24 hour(s))  CBC     Status: Abnormal   Collection Time: 02/24/18  5:13 AM  Result Value Ref Range   WBC 13.5 (H) 3.6 - 11.0 K/uL   RBC 3.25 (L) 3.80 - 5.20 MIL/uL   Hemoglobin 8.6 (L) 12.0 - 16.0 g/dL   HCT 27.4 (L) 35.0 - 47.0 %   MCV 84.2 80.0 - 100.0 fL   MCH 26.5 26.0 - 34.0 pg   MCHC 31.5 (L) 32.0 - 36.0 g/dL   RDW 16.8 (H) 11.5 - 14.5 %   Platelets 204 150 - 440 K/uL  Basic metabolic panel     Status: Abnormal   Collection Time: 02/24/18  5:13 AM  Result Value Ref Range   Sodium 132 (L) 135 - 145 mmol/L   Potassium 4.7 3.5 - 5.1 mmol/L   Chloride 102 101 - 111 mmol/L   CO2 24 22 - 32 mmol/L   Glucose, Bld 109 (H) 65 - 99 mg/dL   BUN 22 (H) 6 - 20 mg/dL   Creatinine, Ser 1.16 (H) 0.44 - 1.00 mg/dL   Calcium 8.0 (L) 8.9 - 10.3 mg/dL   GFR calc non Af Amer 46 (L) >60 mL/min   GFR calc Af Amer 53 (L) >60 mL/min   Anion gap 6 5 - 15    Dg Chest Port 1 View  Result Date: 02/22/2018 CLINICAL DATA:  [place] [address] down steps.  Left hip pain EXAM: PORTABLE  CHEST 1 VIEW COMPARISON:  06/02/2017 FINDINGS: Bibasilar airspace disease unchanged, probable scarring. There may also be a component of atelectasis. Negative for heart failure or pneumonia.  No interval change IMPRESSION: Bibasilar airspace disease unchanged and appearing to represent chronic scarring. No acute abnormality. Electronically Signed   By: Franchot Gallo M.D.   On: 02/22/2018 16:37   Dg Hip Port Unilat With Pelvis 1v Left  Result Date: 02/23/2018 CLINICAL DATA:  72 year old female with left subcapital femur fracture. Subsequent encounter. EXAM: DG HIP (WITH OR WITHOUT PELVIS) 1V PORT LEFT COMPARISON:  02/22/2018 x-ray. FINDINGS: Post total left hip replacement which appears in satisfactory position without complication noted. Vascular groove proximal to mid femoral shaft similar to preoperative exam. IMPRESSION: Post left hip replacement. Electronically Signed   By: Genia Del M.D.   On: 02/23/2018 15:05   Dg Hip Unilat With Pelvis 2-3 Views Left  Result Date: 02/22/2018 CLINICAL DATA:  Fall.  Hip  pain EXAM: DG HIP (WITH OR WITHOUT PELVIS) 2-3V LEFT COMPARISON:  None. FINDINGS: Subcapital femur fracture on the left with mild displacement and impaction. Left hip joint normal. IMPRESSION: Subcapital fracture left femur Electronically Signed   By: Franchot Gallo M.D.   On: 02/22/2018 16:33    Assessment/Plan: 1 Day Post-Op   Active Problems:   Hip fracture ([place])  Patient is stable postop. Vital signs are stable.  Patient on supplemental O2.  She has a history of pulmonary fibrosis. Continue physical therapy and Lovenox.  : Pain medication regimen is providing relief.    Thornton Park , MD 02/24/2018, 1:08 PM

## 2018-02-24 NOTE — Progress Notes (Signed)
Physical Therapy Treatment Patient Details Name: Erin [name] MRN: [id number] DOB: 19-Oct-1946 Today's Date: 02/24/2018    History of Present Illness Pt is a68 y.o.femalewith a known history of rheumatoid arthritis, [name],hypertension, [name], hyperlipidemia comes to the emergency room after patient was ambulating at home with a cane and trying to get down the steps missed this last step at home had a mechanical fall and started having left hip pain.  Pt diagnosed with a L femoral neck fracture and is now s/p L hip hemiarthroplasty.  Assessment also includes chronic interstitial fibrosis in the setting of rheumatoid arthritis and scleroderma.    PT Comments    Pt agreeable to PT and eager to get up to the chair. Pt reports 4/10 pain in L hip with mild increase with movement/stand activity. Pt demonstrates Mod A x 1 for bed mobility with cues for sequencing and adhering to posterior hip precautions. Pt able to sit edge of bed unsupported with R lateral lean. Mod x 2 for STS from elevated surface with heavy cues for quad/glut activation, which is improved with cues. Unable to step, but demonstrates pivot on R toward chair with limited wt through LLE. Pt able to scoot self backward in chair. Pt pleased to be up in chair; nursing notified. Continue PT to progress strength, endurance to improve all functional mobility.   Follow Up Recommendations  SNF     Equipment Recommendations       Recommendations for Other Services       Precautions / Restrictions Precautions Precautions: Fall;Posterior Hip Precaution Comments: Remembers 1 of 3 precautions; pt re educated Restrictions Weight Bearing Restrictions: Yes LLE Weight Bearing: Weight bearing as tolerated    Mobility  Bed Mobility Overal bed mobility: Needs Assistance Bed Mobility: Supine to Sit     Supine to sit: Mod assist(of 1)     General bed mobility comments: Increased time/effort and cues for sequencing.  Assist for LLE and trunk. Pt able to scoot bottom to edge with cues and assists with UEs for trunk elevation. R lean in sit  Transfers Overall transfer level: Needs assistance Equipment used: Rolling walker (2 wheeled) Transfers: Sit to/from Stand Sit to Stand: Mod assist;+2 safety/equipment;From elevated surface;+2 physical assistance         General transfer comment: Heavy cueing for quad and glut activation and to maintain hands on rw versus forearms. Able to demonstrate improve LE extension with cueing.   Ambulation/Gait Ambulation/Gait assistance: Mod assist;+2 physical assistance           General Gait Details: No actual steps, but pt able to demonstrate pivoting RLE to maneuver toward chair with limited weight shift to L.    Stairs            Wheelchair Mobility    Modified Rankin (Stroke Patients Only)       Balance Overall balance assessment: Needs assistance Sitting-balance support: Feet supported;Bilateral upper extremity supported Sitting balance-Leahy Scale: Fair Sitting balance - Comments: R lean   Standing balance support: Bilateral upper extremity supported Standing balance-Leahy Scale: Poor                              Cognition Arousal/Alertness: Awake/alert Behavior During Therapy: WFL for tasks assessed/performed Overall Cognitive Status: Within Functional Limits for tasks assessed  General Comments: Pt eager to get out of bed      Exercises Total Joint Exercises Ankle Circles/Pumps: AROM;Both;15 reps Quad Sets: Strengthening;Both;15 reps Gluteal Sets: Strengthening;Both;15 reps Other: 4/7/8 calming breathing pattern   General Comments        Pertinent Vitals/Pain Pain Assessment: 0-10 Pain Score: 4 (at rest; slightly more with movement/wb'g) Pain Location: L hip Pain Intervention(s): Monitored during session;Repositioned    Home Living                       Prior Function            PT Goals (current goals can now be found in the care plan section) Progress towards PT goals: Progressing toward goals    Frequency    BID      PT Plan Current plan remains appropriate    Co-evaluation              AM-PAC PT "6 Clicks" Daily Activity  Outcome Measure  Difficulty turning over in bed (including adjusting bedclothes, sheets and blankets)?: Unable Difficulty moving from lying on back to sitting on the side of the bed? : Unable Difficulty sitting down on and standing up from a chair with arms (e.g., wheelchair, bedside commode, etc,.)?: Unable Help needed moving to and from a bed to chair (including a wheelchair)?: A Lot Help needed walking in hospital room?: Total Help needed climbing 3-5 steps with a railing? : Total 6 Click Score: 7    End of Session Equipment Utilized During Treatment: Gait belt;Oxygen Activity Tolerance: Other (comment);Patient limited by pain(weakness) Patient left: in chair;with call bell/phone within reach;with chair alarm set;with family/visitor present   PT Visit Diagnosis: Other abnormalities of gait and mobility (R26.89);Muscle weakness (generalized) (M62.81)     Time: 9509-3267 PT Time Calculation (min) (ACUTE ONLY): 36 min  Charges:  $Therapeutic Exercise: 8-22 mins $Therapeutic Activity: 8-22 mins                    G Codes:        Larae Grooms, PTA 02/24/2018, 3:21 PM

## 2018-02-24 NOTE — Evaluation (Signed)
Clinical/Bedside Swallow Evaluation Patient Details  Name: Erin [name] MRN: [id number] Date of Birth: [birth year]  Today's Date: 02/24/2018 Time: SLP Start Time (ACUTE ONLY): 0855 SLP Stop Time (ACUTE ONLY): 0940 SLP Time Calculation (min) (ACUTE ONLY): 45 min  Past Medical History:  Past Medical History:  Diagnosis Date  . Anemia    h/o  . Cancer ([place])    melanoma x 2 the last one was of her leg and surgeon removed it  . Coronary artery disease   . DDD (degenerative disc disease), lumbar   . Dysphagia   . Dyspnea    with exertion-unable to walk a mile without getting sob- dr sparks set pt up to see Dr Rockey Situ after 11-19-16 surgery  . Esophageal dysmotility   . Fibrocystic breast disease   . GERD (gastroesophageal reflux disease)   . Heart murmur    h/o   . Hyperlipidemia   . Hypertension   . Mitral regurgitation   . Osteopenia   . Pulmonary fibrosis ([place])    per Dr Raul Del  . RA (rheumatoid arthritis) ([place])   . Raynaud's disease   . Scleroderma ([place])   . Vulvar dysplasia    Past Surgical History:  Past Surgical History:  Procedure Laterality Date  . ABDOMINAL HYSTERECTOMY    . BREAST CYST EXCISION Left 20+ years ago   No scar visible  . CARPAL TUNNEL RELEASE Bilateral   . COLONOSCOPY    . COLONOSCOPY WITH PROPOFOL N/A 01/20/2017   Procedure: COLONOSCOPY WITH PROPOFOL;  Surgeon: Lollie Sails, MD;  Location: [facility] ENDOSCOPY;  Service: Endoscopy;  Laterality: N/A;  . ESOPHAGOGASTRODUODENOSCOPY    . ESOPHAGOGASTRODUODENOSCOPY (EGD) WITH PROPOFOL N/A 01/20/2017   Procedure: ESOPHAGOGASTRODUODENOSCOPY (EGD) WITH PROPOFOL;  Surgeon: Lollie Sails, MD;  Location: [facility] ENDOSCOPY;  Service: Endoscopy;  Laterality: N/A;  . EXCISION HYDRADENITIS LABIA Left 11/19/2016   Procedure: EXCISION LABIAL MASS;  Surgeon: Robert Bellow, MD;  Location: [facility] ORS;  Service: General;  Laterality: Left;  . EYE SURGERY Bilateral    cataracts  . HEEL SPUR EXCISION    . HIP  ARTHROPLASTY Left 02/23/2018   Procedure: ARTHROPLASTY BIPOLAR HIP (HEMIARTHROPLASTY);  Surgeon: Thornton Park, MD;  Location: [facility] ORS;  Service: Orthopedics;  Laterality: Left;  Marland Kitchen MASS EXCISION Left 11/19/2016   Procedure: EXCISION LEFT THIGH MELANOMA;  Surgeon: Robert Bellow, MD;  Location: [facility] ORS;  Service: General;  Laterality: Left;  . SENTINEL NODE BIOPSY Left 11/19/2016   Procedure: INGUINAL SENTINEL NODE BIOPSY;  Surgeon: Robert Bellow, MD;  Location: [facility] ORS;  Service: General;  Laterality: Left;  . UPPER ESOPHAGEAL ENDOSCOPIC ULTRASOUND (EUS) N/A 01/29/2017   Procedure: UPPER ESOPHAGEAL ENDOSCOPIC ULTRASOUND (EUS);  Surgeon: Holly Bodily, MD;  Location: [facility] ENDOSCOPY;  Service: Endoscopy;  Laterality: N/A;   HPI:  Pt is a 72 y.o. female with a known history of rheumatoid arthritis, scleroderma, Raynaud's dis., Esophageal dysmotility, DDD, pulmonary fibrosis, hypertension, [name], hyperlipidemia comes to the emergency room after patient was ambulating at home with a cane and trying to get down the steps missed this last step at home had a mechanical fall and started having left hip pain.  Pt diagnosed with a L femoral neck fracture and is now s/p L hip hemiarthroplasty.  Assessment also includes chronic interstitial fibrosis in the setting of rheumatoid arthritis and scleroderma. Pt stated she had an appointment next month w/ GI for f/u for the Raynaud's Dis and scleroderma as she has ongoing s/s of significant Esophageal  dysmotility. Such GI issues can increase risk for regurgitation of REFLUX material and aspiration of such thus impacting her Pulmonary status, decline.    Assessment / Plan / Recommendation Clinical Impression  Pt appears to present w/ adequate oropharyngeal phase swallow function w/ no gross s/s oral or pharyngeal phase deficits; no overt s/s of aspiration noted w/ oral intake. Pt consumed items from breakfast meal and sips of thin liquids via straw w/ no  overt coughing or wet vocal quality noted. Pt's baseline respiratory status is easily impacted by any exertion w/ min increased respiratory effort - w/ rest break intermittently, this calmed. Suspect this presentation is d/t pt's baseline declined Pulmonary status - includes chronic interstitial fibrosis in the setting of rheumatoid arthritis and scleroderma, Raynaud's Disease. Pt fed self. Recommend a Mech Soft diet w/ cut meats, moistened foods; Thin liquids. Recommend general aspiration precautions; pills in Puree if easier for swallowing/clearing. Education given on the need to follow strict REFLUX precautions for the Esophgeal dysmotility she experiences frequently secondary to the Regurgitation of food/liquid material(d/t the scleroderma, Raynaud's dis.). Pt stated she had an appointment next month w/ GI for f/u for the Esophageal dysmotility from Raynaud's Dis and scleroderma as she has ongoing s/s of significant Esophageal dysphagia s/s(Regurgitation of material during the night even). Such GI issues can increase risk for Regurgitation of REFLUX material and aspiration of such thus impacting her Pulmonary status, decline including pneumonia. Pt would benefit from a Dietician f/u for education on her Dietary needs - a diet of foods appropriate for her dxs of scleroderma, Raynaud's Dis. to reduce dysmotility/irritation of the Esophagus hopefully.  SLP Visit Diagnosis: Dysphagia, unspecified (R13.10)(Esophageal dysphagia baseline)    Aspiration Risk  Mild aspiration risk(of REFLUX material/regurgitation from Esophagus)    Diet Recommendation  Mech Soft diet w/ cut meats, moistened foods; Thin liquids. General aspiration precautions; STRICT REFLUX PRECAUTIONS d/t her degree of Esophageal dysmotility  Medication Administration: Whole meds with puree(consider breaking small, crushing)    Other  Recommendations Recommended Consults: Consider GI evaluation;Consider esophageal assessment(education; tx.   Dietician f/u for approp. foods) Oral Care Recommendations: Oral care BID;Patient independent with oral care Other Recommendations: (n/a)   Follow up Recommendations None      Frequency and Duration (n/a)  (n/a)       Prognosis Prognosis for Safe Diet Advancement: Fair Barriers to Reach Goals: Severity of deficits(baseline scleroderma; Raynaud's Dis.)      Swallow Study   General Date of Onset: 02/22/18 HPI: Pt is a 72 y.o. female with a known history of rheumatoid arthritis, scleroderma, Raynaud's dis., Esophageal dysmotility, DDD, pulmonary fibrosis, hypertension, [name], hyperlipidemia comes to the emergency room after patient was ambulating at home with a cane and trying to get down the steps missed this last step at home had a mechanical fall and started having left hip pain.  Pt diagnosed with a L femoral neck fracture and is now s/p L hip hemiarthroplasty.  Assessment also includes chronic interstitial fibrosis in the setting of rheumatoid arthritis and scleroderma. Pt stated she had an appointment next month w/ GI for f/u for the Raynaud's Dis and scleroderma as she has ongoing s/s of significant Esophageal dysmotility. Such GI issues can increase risk for regurgitation of REFLUX material and aspiration of such thus impacting her Pulmonary status, decline.  Type of Study: Bedside Swallow Evaluation Previous Swallow Assessment: none by ST services; has been seen by GI in past Diet Prior to this Study: Regular;Thin liquids Temperature Spikes Noted: (wbc 13.5;  temp 99.2) Respiratory Status: Nasal cannula(2 liters) History of Recent Intubation: No Behavior/Cognition: Alert;Cooperative;Pleasant mood Oral Cavity Assessment: Within Functional Limits Oral Care Completed by SLP: Recent completion by staff Oral Cavity - Dentition: Adequate natural dentition Vision: Functional for self-feeding Self-Feeding Abilities: Able to feed self Patient Positioning: Upright in bed Baseline Vocal  Quality: Normal(adequate) Volitional Cough: Strong Volitional Swallow: Able to elicit    Oral/Motor/Sensory Function Overall Oral Motor/Sensory Function: Within functional limits   Ice Chips Ice chips: Not tested   Thin Liquid Thin Liquid: Within functional limits Presentation: Self Fed;Straw(~2-3 ozs)    Nectar Thick Nectar Thick Liquid: Not tested   Honey Thick Honey Thick Liquid: Not tested   Puree Puree: Not tested   Solid   GO   Solid: Within functional limits Presentation: Self Fed;Spoon(multiple breakfast foods on tray) Other Comments: belching post the meal        Orinda Kenner, MS, CCC-SLP Watson,Katherine 02/24/2018,2:12 PM

## 2018-02-24 NOTE — Progress Notes (Signed)
[place] [facility] [place] NAME: Erin [name]    MR#:  [id number]  DATE OF BIRTH:  28-Sep-1946  SUBJECTIVE:  Postop day 1.  Patient waiting for PT.  Doing well otherwise. REVIEW OF SYSTEMS:  Review of Systems  Constitutional: Negative for chills, fever and weight loss.  HENT: Negative for nosebleeds and sore throat.   Eyes: Negative for blurred vision.  Respiratory: Negative for cough, shortness of breath and wheezing.   Cardiovascular: Negative for chest pain, orthopnea, leg swelling and PND.  Gastrointestinal: Negative for abdominal pain, constipation, diarrhea, heartburn, nausea and vomiting.  Genitourinary: Negative for dysuria and urgency.  Musculoskeletal: Positive for joint pain. Negative for back pain.  Skin: Negative for rash.  Neurological: Negative for dizziness, speech change, focal weakness and headaches.  Endo/Heme/Allergies: Does not bruise/bleed easily.  Psychiatric/Behavioral: Negative for depression.   DRUG ALLERGIES:   Allergies  Allergen Reactions  . Remicade [Infliximab] Shortness Of Breath and Itching  . Sulfa Antibiotics Other (See Comments)    Other Reaction: tounge cracked  . Actonel [Risedronate Sodium] Other (See Comments)  . Fosamax [Alendronate Sodium]     GI Bleed   . Procardia [Nifedipine] Hives  . Sulfur Swelling  . Wellbutrin [Bupropion] Anxiety   VITALS:  Blood pressure (!) 139/58, pulse 94, temperature 99.2 F (37.3 C), temperature source Axillary, resp. rate 20, height 5\' 2"  (1.575 m), weight 86.2 kg (190 lb), SpO2 99 %. PHYSICAL EXAMINATION:  Physical Exam  Constitutional: She is oriented to person, place, and time and well-developed, well-nourished, and in no distress.  HENT:  Head: Normocephalic and atraumatic.  Eyes: Conjunctivae and EOM are normal. Pupils are equal, round, and reactive to light.  Neck: Normal range of motion. Neck supple. No tracheal deviation present. No thyromegaly  present.  Cardiovascular: Normal rate, regular rhythm and normal heart sounds.  Pulmonary/Chest: Effort normal and breath sounds normal. No respiratory distress. She has no wheezes. She exhibits no tenderness.  Abdominal: Soft. Bowel sounds are normal. She exhibits no distension. There is no tenderness.  Musculoskeletal:       Left hip: She exhibits decreased range of motion, tenderness and bony tenderness.  shortening and external rotation to the left lower extremity  Neurological: She is alert and oriented to person, place, and time. No cranial nerve deficit.  Skin: Skin is warm and dry. No rash noted.  Psychiatric: Mood and affect normal.   LABORATORY PANEL:  Female CBC Recent Labs  Lab 02/24/18 0513  WBC 13.5*  HGB 8.6*  HCT 27.4*  PLT 204   ------------------------------------------------------------------------------------------------------------------ Chemistries  Recent Labs  Lab 02/22/18 1552 02/24/18 0513  NA 130* 132*  K 4.3 4.7  CL 98* 102  CO2 23 24  GLUCOSE 112* 109*  BUN 25* 22*  CREATININE 0.92 1.16*  CALCIUM 8.2* 8.0*  AST 32  --   ALT 29  --   ALKPHOS 88  --   BILITOT 0.6  --    RADIOLOGY:  No results found. ASSESSMENT AND PLAN:  Erin [name]  is a 72 y.o. female with a known history of rheumatoid arthritis, [name], hypertension, [name], hyperlipidemia comes to the emergency room after patient was ambulating at home with a cane and trying to get down the steps missed this last step at home had a mechanical fall and started having left hip pain.  1.  Acute left femoral subcapital fracture status post mechanical fall at home - POD # 1 -Continue  physical therapy.  Social worker for discharge planning. -Incentive spirometer, PRN nebs, as needed oxygen.  Current sats are more than 95% on room air  2.  History of chronic interstitial fibrosis in the setting of rheumatoid arthritis and scleroderma -Closed with pulmonary as  outpatient. -Pulmonary function test in 2018 showed no obstruction -Previous chest x-ray shows bibasilar interstitial fibrosis/scarring -Sats are stable. -Continue pulmonary toilet/rehab perioperative  3.  History of [name] -Stress test in 2018 unremarkable -Follows with Dr. Rockey Situ -Continue cardiac meds  4.  History of Raynouds, scleroderma, rheumatoid arthritis -Continue prednisone and Plaquenil   Overall improving.    All the records are reviewed and case discussed with Care Management/Social Worker. Management plans discussed with the patient, family and they are in agreement.  CODE STATUS: Full Code  TOTAL TIME TAKING CARE OF THIS PATIENT:25 minutes.   More than 50% of the time was spent in counseling/coordination of care: YES  POSSIBLE D/C IN 2-3 DAYS, DEPENDING ON CLINICAL CONDITION.   Fritzi Mandes M.D on 02/24/2018 at 3:21 PM  Between 7am to 6pm - Pager - [phone]  After 6pm go to [email or web address] - Proofreader  [facility]  Office  [phone]  CC: Primary care physician; Idelle Crouch, MD  Note: This dictation was prepared with Dragon dictation along with smaller phrase technology. Any transcriptional errors that result from this process are unintentional.

## 2018-02-24 NOTE — Progress Notes (Signed)
Clinical Education officer, museum (CSW) met with patient and confirmed D/C plan. Patient will D/C to [facility]. CSW made patient aware per [facility] admissions coordinator at [facility] she will try to secure a private room for patient.   [employer], LCSW [phone]

## 2018-02-24 NOTE — Evaluation (Signed)
Occupational Therapy Evaluation Patient Details Name: Erin [name] MRN: [id number] DOB: 04/20/1946 Today's Date: 02/24/2018    History of Present Illness Pt is a74 y.o.femalewith a known history of rheumatoid arthritis, [name],hypertension, [name], hyperlipidemia comes to the emergency room after patient was ambulating at home with a cane and trying to get down the steps missed this last step at home had a mechanical fall and started having left hip pain.  Pt diagnosed with a L femoral neck fracture and is now s/p L hip hemiarthroplasty.  Assessment also includes chronic interstitial fibrosis in the setting of rheumatoid arthritis and scleroderma.   Clinical Impression   Pt seen for OT evaluation this date, POD#1. Pt lives at home alone with decreased social supports available to her, 2 steps to enter the home and no handrails. PTA, pt generally independent with ADL, med mgt, using a SPC in the home and limited community distances, and has a friend drive her where she needs to go. Pt is eager to return to PLOF.  Pt is currently limited in functional ADLs due to pain, decreased ROM, decreased strength, and decreased knowledge of posterior total hip precautions.  Pt requires maximum assistX2 for mobility and max assist for LB dressing and bathing skills due to pain and decreased AROM of L LE.  Pt instructed in posterior total hip precautions with verbal instruction and visual demo and teach back methods. Pt also instructed in AE for LB dressing tasks and implementation of precautions during mobility and ADL tasks. Pt will benefit from continued skilled OT services for education in assistive devices, functional mobility, and education in recommendations for home modifications to increase safety and prevent falls.  Pt is a good candidate for SNF to continue rehabilitation.      Follow Up Recommendations  SNF    Equipment Recommendations  3 in 1 bedside commode(bariatric 3:1)     Recommendations for Other Services       Precautions / Restrictions Precautions Precautions: Fall;Posterior Hip Precaution Comments: recalls 2/3 posterior THPs, re-education provided with verbal and visual demo Restrictions Weight Bearing Restrictions: Yes LLE Weight Bearing: Weight bearing as tolerated      Mobility Bed Mobility      General bed mobility comments: deferred, up in recliner, significant pain with movement, happy to be sitting up in the chair after PT     Balance Overall balance assessment: Needs assistance Sitting-balance support: Feet supported;Bilateral upper extremity supported Sitting balance-Leahy Scale: Fair Sitting balance - Comments: R lean                               ADL either performed or assessed with clinical judgement   ADL Overall ADL's : Needs assistance/impaired Eating/Feeding: Sitting;Independent   Grooming: Sitting;Independent   Upper Body Bathing: Bed level;Minimal assistance   Lower Body Bathing: Maximal assistance;Bed level   Upper Body Dressing : Sitting;Minimal assistance   Lower Body Dressing: Bed level;Maximal assistance Lower Body Dressing Details (indicate cue type and reason): pt instructed in AE for LB dressing  Toilet Transfer: RW;Stand-pivot;+2 for physical assistance;Maximal assistance                   Vision Baseline Vision/History: Wears glasses Wears Glasses: Reading only Patient Visual Report: No change from baseline Vision Assessment?: No apparent visual deficits     Perception     Praxis      Pertinent Vitals/Pain Pain Assessment: 0-10 Pain Score:  1  Pain Location: L hip at rest; "11 or 12 when I move my leg" Pain Descriptors / Indicators: Sore Pain Intervention(s): Limited activity within patient's tolerance;Monitored during session     Hand Dominance Right   Extremity/Trunk Assessment Upper Extremity Assessment Upper Extremity Assessment: Generalized weakness   Lower  Extremity Assessment Lower Extremity Assessment: Generalized weakness;Defer to PT evaluation;LLE deficits/detail       Communication Communication Communication: No difficulties   Cognition Arousal/Alertness: Awake/alert Behavior During Therapy: WFL for tasks assessed/performed Overall Cognitive Status: Within Functional Limits for tasks assessed                                 General Comments: Pt eager to get out of bed   General Comments       Exercises Other Exercises Other Exercises: Pt instructed in falls prevention strategies and implementation of posterior THPs during mobility and ADL tasks to maximize safety and independence. Pt would benefit from further training   Shoulder Instructions      Home Living Family/patient expects to be discharged to:: Private residence Living Arrangements: Alone Available Help at Discharge: Friend(s);Available PRN/intermittently Type of Home: House Home Access: Stairs to enter [employer] of Steps: 2 Entrance Stairs-Rails: None Home Layout: One level     Bathroom Shower/Tub: Teacher, early years/pre: Standard     Home Equipment: Cane - single point;Shower seat   Additional Comments: borrowed a 2WW from a friend      Prior Functioning/Environment Level of Independence: Independent with assistive device(s)        Comments: Mod Ind with amb with RW limited community distances with no other fall history other than current fall, Ind with ADLs, friend drives for errands. Pt endorses that ADL at home are "getting harder" to perform independently.        OT Problem List: Decreased strength;Decreased knowledge of use of DME or AE;Decreased knowledge of precautions;Decreased range of motion;Decreased activity tolerance;Pain;Impaired balance (sitting and/or standing)      OT Treatment/Interventions: Self-care/ADL training;Balance training;Therapeutic exercise;Therapeutic activities;DME and/or AE  instruction;Patient/family education    OT Goals(Current goals can be found in the care plan section) Acute Rehab OT Goals Patient Stated Goal: To be strong enough to take care of myself OT Goal Formulation: With patient Time For Goal Achievement: 03/10/18 Potential to Achieve Goals: Good ADL Goals Pt Will Transfer to Toilet: with mod assist;ambulating;with +2 assist;bedside commode Additional ADL Goal #1: Pt will verbalize and implement 3/3 posterior total hip precautions during mobility and ADL tasks 5/5 opportunities.  OT Frequency: Min 1X/week   Barriers to D/C: Decreased caregiver support;Inaccessible home environment          Co-evaluation              AM-PAC PT "6 Clicks" Daily Activity     Outcome Measure Help from another person eating meals?: None Help from another person taking care of personal grooming?: None Help from another person toileting, which includes using toliet, bedpan, or urinal?: A Lot Help from another person bathing (including washing, rinsing, drying)?: A Lot Help from another person to put on and taking off regular upper body clothing?: A Little Help from another person to put on and taking off regular lower body clothing?: A Lot 6 Click Score: 17   End of Session    Activity Tolerance: Patient limited by pain Patient left: in chair;with call bell/phone within reach;with chair alarm  set  OT Visit Diagnosis: Other abnormalities of gait and mobility (R26.89);History of falling (Z91.81);Muscle weakness (generalized) (M62.81);Pain Pain - Right/Left: Left Pain - part of body: Hip                Time: 3419-3790 OT Time Calculation (min): 29 min Charges:  OT General Charges $OT Visit: 1 Visit OT Evaluation $OT Eval Low Complexity: 1 Low OT Treatments $Self Care/Home Management : 8-22 mins  Jeni Salles, MPH, MS, OTR/L ascom [phone] 02/24/18, 5:10 PM

## 2018-02-24 NOTE — Anesthesia Postprocedure Evaluation (Signed)
Anesthesia Post Note  Patient: Erin [name]  Procedure(s) Performed: ARTHROPLASTY BIPOLAR HIP (HEMIARTHROPLASTY) (Left Hip)  Patient location during evaluation: Nursing Unit Anesthesia Type: Spinal Level of consciousness: awake and alert and oriented Pain management: satisfactory to patient Vital Signs Assessment: post-procedure vital signs reviewed and stable Respiratory status: respiratory function stable Cardiovascular status: stable Postop Assessment: no backache, no headache, no apparent nausea or vomiting, patient able to bend at knees, spinal receding and adequate PO intake Anesthetic complications: no     Last Vitals:  Vitals:   02/24/18 0840 02/24/18 0855  BP: (!) 139/58   Pulse: 92   Resp: 20   Temp: 37.3 C   SpO2: (!) 87% 97%    Last Pain:  Vitals:   02/24/18 0840  TempSrc: Axillary  PainSc:                  [name]

## 2018-02-24 NOTE — Evaluation (Signed)
Physical Therapy Evaluation Patient Details Name: Erin [name] MRN: [id number] DOB: [birth year] Today's Date: 02/24/2018   History of Present Illness  Pt is a33 y.o.femalewith a known history of rheumatoid arthritis, [name],hypertension, [name], hyperlipidemia comes to the emergency room after patient was ambulating at home with a cane and trying to get down the steps missed this last step at home had a mechanical fall and started having left hip pain.  Pt diagnosed with a L femoral neck fracture and is now s/p L hip hemiarthroplasty.  Assessment also includes chronic interstitial fibrosis in the setting of rheumatoid arthritis and scleroderma.    Clinical Impression  Pt presents with deficits in strength, transfers, mobility, gait, balance, and activity tolerance.  Pt required extensive assistance with bed mobility tasks and transfers along with max verbal and tactile cues for proper sequencing to maintain posterior hip precautions.  Pt very weak and antalgic in standing at EOB and her LLE buckled on the first attempt to lift her RLE requiring extensive assist to correct.  At this point pt began leaning on RW handles with her forearms and would not return to holding the walker with her hands.  She was able with +2 assist for safety to shuffle her BLEs at the EOB but was not able to lift either LE off of the floor.  Pt is at a very high risk for falls at this time and would not be safe to return home.  Pt will benefit from PT services in a SNF setting upon discharge to safely address above deficits for decreased caregiver assistance and eventual return to PLOF.      Follow Up Recommendations SNF    Equipment Recommendations  Other (comment)(TBD at next venue of care)    Recommendations for Other Services       Precautions / Restrictions Precautions Precautions: Fall;Posterior Hip Precaution Booklet Issued: Yes (comment) Precaution Comments: Posterior hip precaution  education provided and reviewed verbally, visually with demonstration, with handouts, and applied with functional mobility during session Restrictions Weight Bearing Restrictions: Yes LLE Weight Bearing: Weight bearing as tolerated      Mobility  Bed Mobility Overal bed mobility: Needs Assistance Bed Mobility: Sit to Supine;Supine to Sit;Rolling Rolling: Max assist   Supine to sit: Max assist;+2 for physical assistance Sit to supine: +2 for physical assistance;Max assist   General bed mobility comments: Extensive physical assistance and verbal cues for sequencing required during bed mobility tasks and to maintain hip precautions  Transfers Overall transfer level: Needs assistance Equipment used: Rolling walker (2 wheeled) Transfers: Sit to/from Stand Sit to Stand: +2 safety/equipment;Mod assist;From elevated surface         General transfer comment: Extensive verbal and tactile cues for proper sequencing required  Ambulation/Gait Ambulation/Gait assistance: Mod assist Ambulation Distance (Feet): 2 Feet Assistive device: Rolling walker (2 wheeled) Gait Pattern/deviations: Trunk flexed;Step-to pattern;Shuffle;Decreased stance time - left   Gait velocity interpretation: <1.8 ft/sec, indicative of risk for recurrent falls General Gait Details: Pt antalgic on the LLE and buckled on first attempt to lift RLE from the floor requiring Mod A for stability.  Pt leaned forearms onto RW handles and was unable to change to hand hold positions even with max verbal cues.  Stairs            Wheelchair Mobility    Modified Rankin (Stroke Patients Only)       Balance Overall balance assessment: Needs assistance Sitting-balance support: Feet supported;Bilateral upper extremity supported Sitting balance-Leahy Scale:  Fair     Standing balance support: Bilateral upper extremity supported Standing balance-Leahy Scale: Poor                               Pertinent  Vitals/Pain Pain Assessment: 0-10 Pain Score: 3  Pain Location: L hip Pain Descriptors / Indicators: Sore Pain Intervention(s): Premedicated before session;Monitored during session;Limited activity within patient's tolerance    Home Living Family/patient expects to be discharged to:: Private residence Living Arrangements: Alone Available Help at Discharge: Friend(s);Available PRN/intermittently Type of Home: House Home Access: Stairs to enter Entrance Stairs-Rails: None Entrance Stairs-Number of Steps: 2 Home Layout: One level Home Equipment: Walker - 2 wheels;Cane - single point      Prior Function Level of Independence: Independent with assistive device(s)         Comments: Mod Ind with amb with RW limited community distances with no other fall history other than current fall, Ind with ADLs, friend drives for errands     Hand Dominance   Dominant Hand: Right    Extremity/Trunk Assessment   Upper Extremity Assessment Upper Extremity Assessment: Generalized weakness    Lower Extremity Assessment Lower Extremity Assessment: Generalized weakness;LLE deficits/detail LLE Deficits / Details: L hip flex < 3/5, L knee flex/ext limited by pain LLE: Unable to fully assess due to pain       Communication   Communication: No difficulties  Cognition Arousal/Alertness: Awake/alert Behavior During Therapy: WFL for tasks assessed/performed Overall Cognitive Status: Within Functional Limits for tasks assessed                                        General Comments      Exercises Total Joint Exercises Ankle Circles/Pumps: AROM;Both;10 reps Quad Sets: Strengthening;Both;10 reps Heel Slides: AAROM;Both;5 reps;AROM Hip ABduction/ADduction: AAROM;Both;5 reps;AROM Straight Leg Raises: AAROM;Both;5 reps;AROM Long Arc Quad: AROM;Both;10 reps Knee Flexion: AROM;Both;10 reps Other Exercises Other Exercises: Gentle weight shifting L and R in standing    Assessment/Plan    PT Assessment Patient needs continued PT services  PT Problem List Decreased strength;Decreased activity tolerance;Decreased balance;Decreased knowledge of use of DME;Decreased knowledge of precautions;Decreased mobility       PT Treatment Interventions Gait training;DME instruction;Functional mobility training;Balance training;Therapeutic exercise;Therapeutic activities;Patient/family education    PT Goals (Current goals can be found in the Care Plan section)  Acute Rehab PT Goals Patient Stated Goal: To be strong enough to take care of myself PT Goal Formulation: With patient Time For Goal Achievement: 03/09/18 Potential to Achieve Goals: Fair    Frequency BID   Barriers to discharge Inaccessible home environment;Decreased caregiver support      Co-evaluation               AM-PAC PT "6 Clicks" Daily Activity  Outcome Measure Difficulty turning over in bed (including adjusting bedclothes, sheets and blankets)?: Unable Difficulty moving from lying on back to sitting on the side of the bed? : Unable Difficulty sitting down on and standing up from a chair with arms (e.g., wheelchair, bedside commode, etc,.)?: Unable Help needed moving to and from a bed to chair (including a wheelchair)?: Total Help needed walking in hospital room?: Total Help needed climbing 3-5 steps with a railing? : Total 6 Click Score: 6    End of Session Equipment Utilized During Treatment: Gait belt;Oxygen Activity Tolerance: Patient limited by  fatigue;Patient limited by pain Patient left: in bed;with bed alarm set;with nursing/sitter in room;with SCD's reapplied;with call bell/phone within reach Nurse Communication: Mobility status PT Visit Diagnosis: Other abnormalities of gait and mobility (R26.89);Muscle weakness (generalized) (M62.81)    Time: 1610-9604 PT Time Calculation (min) (ACUTE ONLY): 43 min   Charges:   PT Evaluation $PT Eval Low Complexity: 1 Low PT  Treatments $Therapeutic Exercise: 8-22 mins $Therapeutic Activity: 8-22 mins   PT G Codes:        DRoyetta Asal PT, DPT 02/24/18, 11:15 AM

## 2018-02-25 ENCOUNTER — Ambulatory Visit: Payer: Medicare Other

## 2018-02-25 ENCOUNTER — Other Ambulatory Visit: Payer: Self-pay

## 2018-02-25 LAB — CBC
HCT: 26.6 % — ABNORMAL LOW (ref 35.0–47.0)
Hemoglobin: 8.3 g/dL — ABNORMAL LOW (ref 12.0–16.0)
MCH: 26 pg (ref 26.0–34.0)
MCHC: 31.3 g/dL — ABNORMAL LOW (ref 32.0–36.0)
MCV: 82.8 fL (ref 80.0–100.0)
Platelets: 220 10*3/uL (ref 150–440)
RBC: 3.21 MIL/uL — ABNORMAL LOW (ref 3.80–5.20)
RDW: 17.1 % — AB (ref 11.5–14.5)
WBC: 16.2 10*3/uL — AB (ref 3.6–11.0)

## 2018-02-25 LAB — BASIC METABOLIC PANEL
ANION GAP: 7 (ref 5–15)
BUN: 19 mg/dL (ref 6–20)
CALCIUM: 8.3 mg/dL — AB (ref 8.9–10.3)
CO2: 24 mmol/L (ref 22–32)
Chloride: 99 mmol/L — ABNORMAL LOW (ref 101–111)
Creatinine, Ser: 1 mg/dL (ref 0.44–1.00)
GFR calc Af Amer: >60 mL/min (ref >60)
GFR, EST NON AFRICAN AMERICAN: 55 mL/min — AB (ref >60)
GLUCOSE: 121 mg/dL — AB (ref 65–99)
Potassium: 4.8 mmol/L (ref 3.5–5.1)
Sodium: 130 mmol/L — ABNORMAL LOW (ref 135–145)

## 2018-02-25 MED ORDER — FERROUS SULFATE 325 (65 FE) MG PO TABS: 325.0000 mg | ORAL_TABLET | Freq: Three times a day (TID) | ORAL | 3 refills | Status: DC

## 2018-02-25 MED ORDER — OXYCODONE HCL 5 MG PO TABS: 5.0000 mg | ORAL_TABLET | ORAL | 0 refills | Status: DC | PRN

## 2018-02-25 MED ORDER — FLEET ENEMA 7-19 GM/118ML RE ENEM
1.0000 | ENEMA | Freq: Once | RECTAL | Status: AC
  Administered 2018-02-25: 1 via RECTAL

## 2018-02-25 MED ORDER — ZOLPIDEM TARTRATE 10 MG PO TABS: 10.0000 mg | ORAL_TABLET | Freq: Every evening | ORAL | 0 refills | Status: DC | PRN

## 2018-02-25 MED ORDER — ENOXAPARIN SODIUM 40 MG/0.4ML ~~LOC~~ SOLN: 40.0000 mg | SUBCUTANEOUS | 0 refills | Status: DC

## 2018-02-25 NOTE — Progress Notes (Signed)
EMS called for transport.

## 2018-02-25 NOTE — Progress Notes (Signed)
Report called and given to [facility].

## 2018-02-25 NOTE — Care Management Important Message (Signed)
Important Message  Patient Details  Name: ELISSE PENNICK MRN: [id number] Date of Birth: [birth year]   Medicare Important Message Given:  Yes    Marshell Garfinkel, RN 02/25/2018, 8:23 AM

## 2018-02-25 NOTE — Telephone Encounter (Signed)
Rx sent to [facility] phone : [phone] , fax : [phone]  

## 2018-02-25 NOTE — Progress Notes (Signed)
Patient is medically stable for D/C to [facility] today. Per [facility] admissions coordinator at [facility] patient will go to room 204-A (semi-private room with nobody in it). Per Sharyn Lull patient will be moved tomorrow to a private room. Patient is aware of above and agreeable to D/C plan. RN will call report at [phone] and arrange EMS for transport. Clinical Education officer, museum (CSW) sent D/C orders to [facility] [employer] [facility] via [employer]. Patient is aware of above. CSW contacted patient's son Stormy Card and made him aware of above. Please reconsult if future social work needs arise. CSW signing off.   [employer], LCSW [phone]

## 2018-02-25 NOTE — Progress Notes (Signed)
Pt given suppository a few hours ago. Still no BM. MD Posey Pronto paged to get enema order. Awaiting call back.

## 2018-02-25 NOTE — Progress Notes (Signed)
Occupational Therapy Treatment Patient Details Name: Erin [name] MRN: [id number] DOB: 03-10-1946 Today's Date: 02/25/2018    History of present illness Pt is a27 y.o.femalewith a known history of rheumatoid arthritis, [name],hypertension, [name], hyperlipidemia comes to the emergency room after patient was ambulating at home with a cane and trying to get down the steps missed this last step at home had a mechanical fall and started having left hip pain.  Pt diagnosed with a L femoral neck fracture and is now s/p L hip hemiarthroplasty.  Assessment also includes chronic interstitial fibrosis in the setting of rheumatoid arthritis and scleroderma.   OT comments  Upon arrival, pt. was sitting up in the recliner wearing a wet gown. Pt. was assisted with a clean, dry gown with minA, Pt. Was assisted with repositioning. Pt. Education was provided about posterior hip precautions, and A/E use for LE ADLs. Pt. continues to benefit from OT services for ADL training, A/E training, and pt. education about posterior hip precautions, home modification, and DME. Pt. Continues to be appropriate for SNF level of care upon discharge. Pt. continues to benefit from follow-up OT services.   Follow Up Recommendations  SNF    Equipment Recommendations  3 in 1 bedside commode    Recommendations for Other Services      Precautions / Restrictions Precautions Precautions: Fall;Posterior Hip Precaution Comments: recalls 2/3 posterior THPs, re-education provided with verbal and visual demo Restrictions Weight Bearing Restrictions: Yes LLE Weight Bearing: Weight bearing as tolerated       Mobility Bed Mobility Overal bed mobility: Needs Assistance Bed Mobility: Supine to Sit Rolling: Mod assist;Max assist;+2 for physical assistance            Transfers Overall transfer level: Needs assistance Equipment used: Rolling walker (2 wheeled) Transfers: Sit to/from Stand Sit to Stand:  Max assist;+2 physical assistance         General transfer comment: Mobility per PT    Balance Overall balance assessment: Needs assistance Sitting-balance support: Feet supported;Bilateral upper extremity supported Sitting balance-Leahy Scale: Fair     Standing balance support: Bilateral upper extremity supported Standing balance-Leahy Scale: Zero                             ADL either performed or assessed with clinical judgement   ADL Overall ADL's : Needs assistance/impaired Eating/Feeding: Sitting;Independent   Grooming: Sitting;Independent;Set up   Upper Body Bathing: Minimal assistance;Set up   Lower Body Bathing: Maximal assistance   Upper Body Dressing : Set up;Moderate assistance   Lower Body Dressing: Set up;Maximal assistance   Toilet Transfer: +2 for physical assistance;Maximal assistance             General ADL Comments: Pt. education was provided about A/E use for LE ADLs., and Posterior Hip precautions.     Vision       Perception     Praxis      Cognition Arousal/Alertness: Awake/alert Behavior During Therapy: WFL for tasks assessed/performed Overall Cognitive Status: Within Functional Limits for tasks assessed                                          Exercises   Shoulder Instructions   General Comments      Pertinent Vitals/ Pain       Pain Assessment: Faces Faces Pain Scale:  Hurts even more Pain Location: with movement Pain Descriptors / Indicators: Sore;Operative site guarding Pain Intervention(s): Limited activity within patient's tolerance;Monitored during session  Home Living                                          Prior Functioning/Environment              Frequency  Min 1X/week        Progress Toward Goals  OT Goals(current goals can now be found in the care plan section)        Plan      Co-evaluation                 AM-PAC PT "6 Clicks" Daily  Activity     Outcome Measure   Help from another person eating meals?: None Help from another person taking care of personal grooming?: None Help from another person toileting, which includes using toliet, bedpan, or urinal?: A Lot Help from another person bathing (including washing, rinsing, drying)?: A Lot Help from another person to put on and taking off regular upper body clothing?: A Lot Help from another person to put on and taking off regular lower body clothing?: A Lot 6 Click Score: 16    End of Session Equipment Utilized During Treatment: Gait belt  OT Visit Diagnosis: Other abnormalities of gait and mobility (R26.89);History of falling (Z91.81);Muscle weakness (generalized) (M62.81);Pain Pain - Right/Left: Left Pain - part of body: Hip   Activity Tolerance Patient limited by fatigue   Patient Left in chair;with call bell/phone within reach;with chair alarm set   Nurse Communication          Time: 5361-4431 OT Time Calculation (min): 15 min  Charges: OT General Charges $OT Visit: 1 Visit OT Treatments $Self Care/Home Management : 8-22 mins   Harrel Carina, MS, OTR/L 02/25/2018, 12:25 PM

## 2018-02-25 NOTE — Clinical Social Work Placement (Signed)
   CLINICAL SOCIAL WORK PLACEMENT  NOTE  Date:  02/25/2018  Patient Details  Name: Erin [name] MRN: [id number] Date of Birth: 1946-03-04  Clinical Social Work is seeking post-discharge placement for this patient at the [place] level of care (*CSW will initial, date and re-position this form in  chart as items are completed):  Yes   Patient/family provided with [place] Work Department's list of facilities offering this level of care within the geographic area requested by the patient (or if unable, by the patient's family).  Yes   Patient/family informed of their freedom to choose among providers that offer the needed level of care, that participate in Medicare, Medicaid or managed care program needed by the patient, have an available bed and are willing to accept the patient.  Yes   Patient/family informed of [facility]'s ownership interest in [facility] and [facility], as well as of the fact that they are under no obligation to receive care at these facilities.  PASRR submitted to EDS on 02/23/18     PASRR number received on 02/23/18     Existing PASRR number confirmed on       FL2 transmitted to all facilities in geographic area requested by pt/family on 02/23/18     FL2 transmitted to all facilities within larger geographic area on       Patient informed that his/her managed care company has contracts with or will negotiate with certain facilities, including the following:        Yes   Patient/family informed of bed offers received.  Patient chooses bed at [facility] )     Physician recommends and patient chooses bed at      Patient to be transferred to [facility] on 02/25/18.  Patient to be transferred to facility by [facility] )     Patient family notified on 02/25/18 of transfer.  Name of family member notified:  (Patient's son Stormy Card is aware of D/C today. )     PHYSICIAN       Additional  Comment:    _______________________________________________ Kaniah Rizzolo, Veronia Beets, LCSW 02/25/2018, 9:55 AM

## 2018-02-25 NOTE — Progress Notes (Signed)
Physical Therapy Treatment Patient Details Name: Erin [name] MRN: [id number] DOB: 19-Nov-1946 Today's Date: 02/25/2018    History of Present Illness Pt is a36 y.o.femalewith a known history of rheumatoid arthritis, [name],hypertension, [name], hyperlipidemia comes to the emergency room after patient was ambulating at home with a cane and trying to get down the steps missed this last step at home had a mechanical fall and started having left hip pain.  Pt diagnosed with a L femoral neck fracture and is now s/p L hip hemiarthroplasty.  Assessment also includes chronic interstitial fibrosis in the setting of rheumatoid arthritis and scleroderma.    PT Comments    Participated in exercises as described below.  To edge of bed with mod/max a x 2.  Initially required min assist to maintain balance but after time was able to sit unsupported.  Pt participated in standing trials x 3 with walker at bedside but she was able to stand fully on each attempt and on third attempt left LE buckled.  Transfer with walker was deferred at this time and Collene Mares lift was used to transfer to recliner at bedside.  She tolerated Collene Mares well and was able to stand for an extended period of time for pericare due to urinary incontinence without complaint.   Remained in recliner and nurse tech notified of need to use lift to return to bed.   Follow Up Recommendations  SNF     Equipment Recommendations       Recommendations for Other Services       Precautions / Restrictions Precautions Precautions: Fall;Posterior Hip Precaution Comments: recalls 2/3 posterior THPs, re-education provided with verbal and visual demo Restrictions Weight Bearing Restrictions: Yes LLE Weight Bearing: Weight bearing as tolerated    Mobility  Bed Mobility Overal bed mobility: Needs Assistance Bed Mobility: Supine to Sit Rolling: Mod assist;Max assist;+2 for physical assistance            Transfers Overall  transfer level: Needs assistance Equipment used: Rolling walker (2 wheeled) Transfers: Sit to/from Stand Sit to Stand: Max assist;+2 physical assistance         General transfer comment: attempts to stand x 3 with walker but unable to stand fully.  Ambulation/Gait           Gait velocity interpretation: <1.8 ft/sec, indicative of risk for recurrent falls General Gait Details: unable to safely stand fully with buckling LLE.  gait/transfer deferred.   Stairs            Wheelchair Mobility    Modified Rankin (Stroke Patients Only)       Balance Overall balance assessment: Needs assistance Sitting-balance support: Feet supported;Bilateral upper extremity supported Sitting balance-Leahy Scale: Fair     Standing balance support: Bilateral upper extremity supported Standing balance-Leahy Scale: Zero                              Cognition Arousal/Alertness: Awake/alert Behavior During Therapy: WFL for tasks assessed/performed Overall Cognitive Status: Within Functional Limits for tasks assessed                                        Exercises Total Joint Exercises Ankle Circles/Pumps: AROM;Both;15 reps Quad Sets: Strengthening;Both;15 reps Gluteal Sets: Strengthening;Both;15 reps Heel Slides: AAROM;AROM;10 reps;Left Hip ABduction/ADduction: AAROM;AROM;10 reps;Left Straight Leg Raises: AAROM;AROM;10 reps;Left Other Exercises Other Exercises: reviewed  hip precautions, hand placements    General Comments        Pertinent Vitals/Pain Pain Assessment: Faces Faces Pain Scale: Hurts even more Pain Location: with movement Pain Descriptors / Indicators: Sore;Operative site guarding Pain Intervention(s): Limited activity within patient's tolerance;Monitored during session    Home Living                      Prior Function            PT Goals (current goals can now be found in the care plan section) Progress towards PT  goals: Progressing toward goals    Frequency    BID      PT Plan Current plan remains appropriate    Co-evaluation              AM-PAC PT "6 Clicks" Daily Activity  Outcome Measure  Difficulty turning over in bed (including adjusting bedclothes, sheets and blankets)?: Unable Difficulty moving from lying on back to sitting on the side of the bed? : Unable Difficulty sitting down on and standing up from a chair with arms (e.g., wheelchair, bedside commode, etc,.)?: Unable Help needed moving to and from a bed to chair (including a wheelchair)?: Total Help needed walking in hospital room?: Total Help needed climbing 3-5 steps with a railing? : Total 6 Click Score: 6    End of Session Equipment Utilized During Treatment: Gait belt Activity Tolerance: Patient tolerated treatment well Patient left: in chair;with chair alarm set;with call bell/phone within reach Nurse Communication: Mobility status;Need for lift equipment       Time: 5852-7782 PT Time Calculation (min) (ACUTE ONLY): 25 min  Charges:  $Therapeutic Exercise: 8-22 mins $Therapeutic Activity: 8-22 mins                    G Codes:       Chesley Noon, PTA 02/25/18, 10:21 AM

## 2018-02-25 NOTE — Discharge Summary (Signed)
[facility] NAME: Erin [name]    MR#:  [id number]  DATE OF BIRTH:  1946-01-31  DATE OF ADMISSION:  02/22/2018 ADMITTING PHYSICIAN: Fritzi Mandes, MD  DATE OF DISCHARGE: 02/25/2018  PRIMARY CARE PHYSICIAN: Idelle Crouch, MD    ADMISSION DIAGNOSIS:  fall lt hip injury  DISCHARGE DIAGNOSIS:  Acute left femoral subcapital fracture POD # 2  SECONDARY DIAGNOSIS:   Past Medical History:  Diagnosis Date  . Anemia    h/o  . Cancer ([place])    melanoma x 2 the last one was of her leg and surgeon removed it  . Coronary artery disease   . DDD (degenerative disc disease), lumbar   . Dysphagia   . Dyspnea    with exertion-unable to walk a mile without getting sob- dr sparks set pt up to see Dr Rockey Situ after 11-19-16 surgery  . Esophageal dysmotility   . Fibrocystic breast disease   . GERD (gastroesophageal reflux disease)   . Heart murmur    h/o   . Hyperlipidemia   . Hypertension   . Mitral regurgitation   . Osteopenia   . Pulmonary fibrosis ([place])    per Dr Raul Del  . RA (rheumatoid arthritis) ([place])   . Raynaud's disease   . Scleroderma ([place])   . Vulvar dysplasia     HOSPITAL COURSE:   Erin [name] a known history of rheumatoid arthritis, [name],hypertension, [name], hyperlipidemia comes to the emergency room after patient was ambulating at home with a cane and trying to get down the steps missed this last step at home had a mechanical fall and started having left hip pain.  1.  Acute left femoral subcapital fracture status post mechanical fall at home - POD # 2 -Continue physical therapy.  Social worker for discharge planning. -Incentive spirometer, PRN nebs, as needed oxygen. Current sats are more than 95% on room air  2. History of chronic interstitial fibrosis in the setting of rheumatoid arthritis and scleroderma -Closed with pulmonary as outpatient. -Pulmonary  function test in 2018 showed no obstruction -Previous chest x-ray shows bibasilar interstitial fibrosis/scarring -Sats are stable. -Continue pulmonary toilet/rehab perioperative  3. History of [name] -Stress test in 2018 unremarkable -Follows with Dr. Rockey Situ -Continue cardiac meds  4. History of Raynouds, scleroderma, rheumatoid arthritis -Continue prednisone and Plaquenil  Overall improving.  Patient will discharged to [facility] today. Use oxygen as needed.  Use incentive spirometer.  CONSULTS OBTAINED:  Treatment Team:  Thornton Park, MD Fritzi Mandes, MD  DRUG ALLERGIES:   Allergies  Allergen Reactions  . Remicade [Infliximab] Shortness Of Breath and Itching  . Sulfa Antibiotics Other (See Comments)    Other Reaction: tounge cracked  . Actonel [Risedronate Sodium] Other (See Comments)  . Fosamax [Alendronate Sodium]     GI Bleed   . Procardia [Nifedipine] Hives  . Sulfur Swelling  . Wellbutrin [Bupropion] Anxiety    DISCHARGE MEDICATIONS:   Allergies as of 02/25/2018      Reactions   Remicade [infliximab] Shortness Of Breath, Itching   Sulfa Antibiotics Other (See Comments)   Other Reaction: tounge cracked   Actonel [risedronate Sodium] Other (See Comments)   Fosamax [alendronate Sodium]    GI Bleed   Procardia [nifedipine] Hives   Sulfur Swelling   Wellbutrin [bupropion] Anxiety      Medication List    STOP taking these medications   aspirin EC 81 MG tablet   traMADol 50 MG  tablet Commonly known as:  ULTRAM     TAKE these medications   ALPRAZolam 0.5 MG tablet Commonly known as:  XANAX Take 0.5 mg by mouth 4 (four) times daily.   amLODipine 10 MG tablet Commonly known as:  NORVASC Take 10 mg by mouth daily.   cetirizine 10 MG tablet Commonly known as:  ZYRTEC Take 10 mg by mouth daily as needed for allergies.   enoxaparin 40 MG/0.4ML injection Commonly known as:  LOVENOX Inject 0.4 mLs (40 mg total) into the skin daily. Start taking on:   02/26/2018   ezetimibe 10 MG tablet Commonly known as:  ZETIA TAKE ONE TABLET BY MOUTH EVERY DAY   ferrous sulfate 325 (65 FE) MG tablet Take 1 tablet (325 mg total) by mouth 3 (three) times daily after meals.   Fish Oil 1000 MG Caps Take 1 capsule by mouth 2 (two) times daily.   FLUoxetine 40 MG capsule Commonly known as:  PROZAC Take 20 mg by mouth daily.   furosemide 20 MG tablet Commonly known as:  LASIX Take 20mg s daily as needed for swelling   gabapentin 300 MG capsule Commonly known as:  NEURONTIN Take 300 mg by mouth 3 (three) times daily.   hydroxychloroquine 200 MG tablet Commonly known as:  PLAQUENIL Take 200 mg by mouth 2 (two) times daily.   imipramine 25 MG tablet Commonly known as:  TOFRANIL Take 25 mg by mouth 2 (two) times daily.   latanoprost 0.005 % ophthalmic solution Commonly known as:  XALATAN Place 1 drop into both eyes at bedtime.   MULTI-VITAMINS Tabs Take 1 tablet by mouth daily.   oxyCODONE 5 MG immediate release tablet Commonly known as:  Oxy IR/ROXICODONE Take 1 tablet (5 mg total) by mouth every 4 (four) hours as needed for moderate pain (pain score 4-6).   polyethylene glycol packet Commonly known as:  MIRALAX / GLYCOLAX Take 17 g by mouth daily.   predniSONE 5 MG tablet Commonly known as:  DELTASONE Take 5 mg by mouth 2 (two) times daily with a meal.   simvastatin 80 MG tablet Commonly known as:  ZOCOR Take 80mg s orally at bedtime   zolpidem 10 MG tablet Commonly known as:  AMBIEN Take 10 mg by mouth at bedtime as needed for sleep.       If you experience worsening of your admission symptoms, develop shortness of breath, life threatening emergency, suicidal or homicidal thoughts you must seek medical attention immediately by calling 911 or calling your MD immediately  if symptoms less severe.  You Must read complete instructions/literature along with all the possible adverse reactions/side effects for all the Medicines  you take and that have been prescribed to you. Take any new Medicines after you have completely understood and accept all the possible adverse reactions/side effects.   Please note  You were cared for by a hospitalist during your hospital stay. If you have any questions about your discharge medications or the care you received while you were in the hospital after you are discharged, you can call the unit and asked to speak with the hospitalist on call if the hospitalist that took care of you is not available. Once you are discharged, your primary care physician will handle any further medical issues. Please note that NO REFILLS for any discharge medications will be authorized once you are discharged, as it is imperative that you return to your primary care physician (or establish a relationship with a primary care physician if you  do not have one) for your aftercare needs so that they can reassess your need for medications and monitor your lab values. Today   SUBJECTIVE   No new complaints  VITAL SIGNS:  Blood pressure (!) 125/55, pulse 88, temperature 98.7 F (37.1 C), temperature source Oral, resp. rate 18, height 5\' 2"  (1.575 m), weight 86.2 kg (190 lb), SpO2 96 %.  I/O:    Intake/Output Summary (Last 24 hours) at 02/25/2018 0847 Last data filed at 02/25/2018 5573 Gross per 24 hour  Intake 1142 ml  Output 1000 ml  Net 142 ml    PHYSICAL EXAMINATION:  GENERAL:  72 y.o.-year-old patient lying in the bed with no acute distress.  EYES: Pupils equal, round, reactive to light and accommodation. No scleral icterus. Extraocular muscles intact.  HEENT: Head atraumatic, normocephalic. Oropharynx and nasopharynx clear.  NECK:  Supple, no jugular venous distention. No thyroid enlargement, no tenderness.  LUNGS: Decreased breath sounds bilaterally, no wheezing, rales,rhonchi or crepitation. No use of accessory muscles of respiration.  CARDIOVASCULAR: S1, S2 normal. No murmurs, rubs, or gallops.   ABDOMEN: Soft, non-tender, non-distended. Bowel sounds present. No organomegaly or mass.  EXTREMITIES: No pedal edema, cyanosis, or clubbing.  NEUROLOGIC: Cranial nerves II through XII are intact. Muscle strength 5/5 in all extremities. Sensation intact. Gait not checked.  PSYCHIATRIC: The patient is alert and oriented x 3.  SKIN: No obvious rash, lesion, or ulcer.   DATA REVIEW:   CBC  Recent Labs  Lab 02/25/18 0629  WBC 16.2*  HGB 8.3*  HCT 26.6*  PLT 220    Chemistries  Recent Labs  Lab 02/22/18 1552  02/25/18 0629  NA 130*   < > 130*  K 4.3   < > 4.8  CL 98*   < > 99*  CO2 23   < > 24  GLUCOSE 112*   < > 121*  BUN 25*   < > 19  CREATININE 0.92   < > 1.00  CALCIUM 8.2*   < > 8.3*  AST 32  --   --   ALT 29  --   --   ALKPHOS 88  --   --   BILITOT 0.6  --   --    < > = values in this interval not displayed.    Microbiology Results   Recent Results (from the past 240 hour(s))  Surgical pcr screen     Status: None   Collection Time: 02/22/18 10:39 PM  Result Value Ref Range Status   MRSA, PCR NEGATIVE NEGATIVE Final   Staphylococcus aureus NEGATIVE NEGATIVE Final    Comment: (NOTE) The Xpert SA Assay (FDA approved for NASAL specimens in patients 1 years of age and older), is one component of a comprehensive surveillance program. It is not intended to diagnose infection nor to guide or monitor treatment. Performed at [facility], [address] [place], [place] [address]     RADIOLOGY:  Dg Hip Port Unilat With Pelvis 1v Left  Result Date: 02/23/2018 CLINICAL DATA:  72 year old female with left subcapital femur fracture. Subsequent encounter. EXAM: DG HIP (WITH OR WITHOUT PELVIS) 1V PORT LEFT COMPARISON:  02/22/2018 x-ray. FINDINGS: Post total left hip replacement which appears in satisfactory position without complication noted. Vascular groove proximal to mid femoral shaft similar to preoperative exam. IMPRESSION: Post left hip replacement.  Electronically Signed   By: Genia Del M.D.   On: 02/23/2018 15:05     Management plans discussed with the patient,  family and they are in agreement.  CODE STATUS:     Code Status Orders  (From admission, onward)        Start     Ordered   02/22/18 1812  Full code  Continuous     02/22/18 1811    Code Status History    This patient has a current code status but no historical code status.    Advance Directive Documentation     Most Recent Value  Type of Advance Directive  Healthcare Power of Attorney, Living will  Pre-existing out of facility DNR order (yellow form or pink MOST form)  -  "MOST" Form in Place?  -      TOTAL TIME TAKING CARE OF THIS PATIENT: *40* minutes.    Fritzi Mandes M.D on 02/25/2018 at 8:47 AM  Between 7am to 6pm - Pager - [phone] After 6pm go to [email or web address] - password [email or web address] [facility]  Office  [phone]  CC: Primary care physician; Idelle Crouch, MD

## 2018-02-26 ENCOUNTER — Ambulatory Visit
Admission: RE | Admit: 2018-02-26 | Discharge: 2018-02-26 | Disposition: A | Discharge status: Home / Self Care | Payer: Medicare Other | Source: Ambulatory Visit | Attending: Gerontology | Admitting: Gerontology

## 2018-02-26 ENCOUNTER — Other Ambulatory Visit: Payer: Self-pay | Admitting: Pulmonary Disease

## 2018-02-26 ENCOUNTER — Other Ambulatory Visit
Admission: RE | Admit: 2018-02-26 | Discharge: 2018-02-26 | Disposition: A | Discharge status: Home / Self Care | Payer: Medicare Other | Source: Skilled Nursing Facility | Attending: Internal Medicine | Admitting: Internal Medicine

## 2018-02-26 ENCOUNTER — Encounter: Payer: Self-pay | Admitting: Gerontology

## 2018-02-26 ENCOUNTER — Other Ambulatory Visit: Payer: Self-pay | Admitting: Gerontology

## 2018-02-26 ENCOUNTER — Non-Acute Institutional Stay (SKILLED_NURSING_FACILITY): Payer: Medicare Other | Admitting: Gerontology

## 2018-02-26 DIAGNOSIS — R0600 Dyspnea, unspecified: Secondary | ICD-10-CM | POA: Insufficient documentation

## 2018-02-26 DIAGNOSIS — R0602 Shortness of breath: Secondary | ICD-10-CM

## 2018-02-26 DIAGNOSIS — S72002A Fracture of unspecified part of neck of left femur, initial encounter for closed fracture: Secondary | ICD-10-CM | POA: Diagnosis not present

## 2018-02-26 DIAGNOSIS — J849 Interstitial pulmonary disease, unspecified: Secondary | ICD-10-CM | POA: Insufficient documentation

## 2018-02-26 DIAGNOSIS — Z96649 Presence of unspecified artificial hip joint: Secondary | ICD-10-CM | POA: Diagnosis not present

## 2018-02-26 DIAGNOSIS — R918 Other nonspecific abnormal finding of lung field: Secondary | ICD-10-CM

## 2018-02-26 DIAGNOSIS — R0603 Acute respiratory distress: Secondary | ICD-10-CM

## 2018-02-26 DIAGNOSIS — D500 Iron deficiency anemia secondary to blood loss (chronic): Secondary | ICD-10-CM | POA: Diagnosis not present

## 2018-02-26 DIAGNOSIS — I2510 Atherosclerotic heart disease of native coronary artery without angina pectoris: Secondary | ICD-10-CM | POA: Insufficient documentation

## 2018-02-26 DIAGNOSIS — K228 Other specified diseases of esophagus: Secondary | ICD-10-CM

## 2018-02-26 DIAGNOSIS — R062 Wheezing: Secondary | ICD-10-CM

## 2018-02-26 DIAGNOSIS — D649 Anemia, unspecified: Secondary | ICD-10-CM | POA: Diagnosis not present

## 2018-02-26 LAB — CBC WITH DIFFERENTIAL/PLATELET
Basophils Absolute: 0 10*3/uL (ref 0–0.1)
Basophils Relative: 0 %
EOS PCT: 1 %
Eosinophils Absolute: 0.1 10*3/uL (ref 0–0.7)
HCT: 24.3 % — ABNORMAL LOW (ref 35.0–47.0)
HEMOGLOBIN: 7.7 g/dL — AB (ref 12.0–16.0)
LYMPHS PCT: 10 %
Lymphs Abs: 1.4 10*3/uL (ref 1.0–3.6)
MCH: 26.3 pg (ref 26.0–34.0)
MCHC: 31.9 g/dL — ABNORMAL LOW (ref 32.0–36.0)
MCV: 82.3 fL (ref 80.0–100.0)
MONOS PCT: 14 %
MYELOCYTES: 1 %
Monocytes Absolute: 2 10*3/uL — ABNORMAL HIGH (ref 0.2–0.9)
NEUTROS ABS: 10.5 10*3/uL — AB (ref 1.4–6.5)
Neutrophils Relative %: 74 %
Platelets: 245 10*3/uL (ref 150–440)
RBC: 2.95 MIL/uL — AB (ref 3.80–5.20)
RDW: 16.8 % — ABNORMAL HIGH (ref 11.5–14.5)
SMEAR REVIEW: ADEQUATE
WBC: 14 10*3/uL — AB (ref 3.6–11.0)

## 2018-02-26 LAB — COMPREHENSIVE METABOLIC PANEL
ALK PHOS: 105 U/L (ref 38–126)
ALT: 27 U/L (ref 14–54)
AST: 42 U/L — AB (ref 15–41)
Albumin: 2.4 g/dL — ABNORMAL LOW (ref 3.5–5.0)
Anion gap: 11 (ref 5–15)
BUN: 21 mg/dL — AB (ref 6–20)
CO2: 21 mmol/L — AB (ref 22–32)
CREATININE: 0.89 mg/dL (ref 0.44–1.00)
Calcium: 8.4 mg/dL — ABNORMAL LOW (ref 8.9–10.3)
Chloride: 98 mmol/L — ABNORMAL LOW (ref 101–111)
GFR calc Af Amer: >60 mL/min (ref >60)
GFR calc non Af Amer: >60 mL/min (ref >60)
GLUCOSE: 132 mg/dL — AB (ref 65–99)
Potassium: 4.9 mmol/L (ref 3.5–5.1)
Sodium: 130 mmol/L — ABNORMAL LOW (ref 135–145)
Total Bilirubin: 0.6 mg/dL (ref 0.3–1.2)
Total Protein: 6.2 g/dL — ABNORMAL LOW (ref 6.5–8.1)

## 2018-02-26 LAB — SURGICAL PATHOLOGY

## 2018-02-26 LAB — BRAIN NATRIURETIC PEPTIDE: B Natriuretic Peptide: 84 pg/mL (ref 0.0–100.0)

## 2018-02-26 MED ORDER — IOPAMIDOL (ISOVUE-370) INJECTION 76%
75.0000 mL | Freq: Once | INTRAVENOUS | Status: AC | PRN
  Administered 2018-02-26: 75 mL via INTRAVENOUS

## 2018-02-26 NOTE — Progress Notes (Addendum)
Location:   [facility] Room Number: [phone] Place of Service:  SNF [phone]) Provider:  Toni Arthurs, NP-C  Sparks, [name], MD  Patient Care Team: Idelle Crouch, MD as PCP - General (Internal Medicine) Bary Castilla Forest Gleason, MD (General Surgery) Jannet Mantis, MD (Dermatology) Minna Merritts, MD as Consulting Physician (Cardiology) Clent Jacks, RN as Registered Nurse  Extended Emergency Contact Information Primary Emergency Contact: Maryclare Bean [place] Phone: [phone] Relation: Relative Secondary Emergency Contact: Herbie Drape [place] Phone: [phone] Relation: Son  Code Status:  FULL Goals of care: Advanced Directive information Advanced Directives 02/26/2018  Does Patient Have a Medical Advance Directive? No  Type of Advance Directive -  Does patient want to make changes to medical advance directive? -  Copy of [place] in Chart? -     Chief Complaint  Patient presents with  . Acute Visit    Respiratory Distress    HPI:  Pt is a 72 y.o. female seen today for an acute visit for shortness of breath, wheezing.  Patient was admitted to the facility last night from [facility] status post left hip hemiarthroplasty due to closed fracture of the left hip.  Family reports patient was alert and oriented, conversant and in NAD last night.  This morning, patient is lethargic, confused.  Patient thought this was in 1919.  She can identify family members in the room, she is able to answer simple questions and follow simple commands for a brief period of time (approximately 15 to 20 seconds), then fall back asleep.  Respirations are labored, tight sounding wheezing and bilateral upper lobes.  Bilateral lower lobes diminished.  Patient denies chest pain.  She also denies shortness of breath, though she obviously is.  Heart rate regular.  Denies chest pain with deep inspiration.   Patient denies cough or congestion.  Bilateral calves soft, supple.  Negative Homans sign.  Left upper thigh, peri-incisional area with tight edema.  Incision red with ++ serosanguineous drainage.  Bilateral pedal pulses strong and equal.  Patient does have a history of scleroderma.  O2 saturations 97% on 3-1/2 L oxygen per nasal cannula.  Temperature 98.2, pulse 90, respirations 20, blood pressure 141/69.  We will arrange for stat CT angios of the chest.  Patient's son at bedside.  Explained findings and plan of care.  Son is in agreement.    Past Medical History:  Diagnosis Date  . Anemia    h/o  . Cancer ([place])    melanoma x 2 the last one was of her leg and surgeon removed it  . Colon polyp   . Coronary artery calcification   . Coronary artery disease   . DDD (degenerative disc disease), lumbar   . Diverticulosis 01/20/2017  . Dysphagia   . Dyspnea    with exertion-unable to walk a mile without getting sob- dr sparks set pt up to see Dr Rockey Situ after 11-19-16 surgery  . Environmental allergies   . Esophageal dysmotility   . Fibrocystic breast disease   . GERD (gastroesophageal reflux disease)   . Heart murmur    h/o   . Hyperlipidemia    unspecified  . Hypertension   . Mitral regurgitation   . Monilial esophagitis ([place]) 01/20/2017  . Obstipation   . Osteopenia   . Peptic ulcer disease   . Pulmonary fibrosis ([place])    per Dr Raul Del  . RA (rheumatoid  arthritis) ([place])   . Raynaud's disease   . Redundant colon 01/20/2017  . Scleroderma ([place])   . Telangiectasia of colon   . Tubular adenoma of colon 01/20/2017   unspecified  . Vulvar dysplasia    Past Surgical History:  Procedure Laterality Date  . ABDOMINAL HYSTERECTOMY    . BREAST CYST EXCISION Left 20+ years ago   No scar visible  . BREAST LUMPECTOMY Right   . CARPAL TUNNEL RELEASE Bilateral   . COLON SURGERY     colon polyp   . COLONOSCOPY  09/30/2011   tubular adenoma rtm  . COLONOSCOPY     05/02/2003, 04/10/2000    . COLONOSCOPY WITH PROPOFOL N/A 01/20/2017   Procedure: COLONOSCOPY WITH PROPOFOL;  Surgeon: Lollie Sails, MD;  Location: [facility] ENDOSCOPY;  Service: Endoscopy;  Laterality: N/A;  . ESOPHAGOGASTRODUODENOSCOPY    . ESOPHAGOGASTRODUODENOSCOPY     09/30/2011, 04/10/2000 , no repeat rtm  . ESOPHAGOGASTRODUODENOSCOPY (EGD) WITH PROPOFOL N/A 01/20/2017   Procedure: ESOPHAGOGASTRODUODENOSCOPY (EGD) WITH PROPOFOL;  Surgeon: Lollie Sails, MD;  Location: [facility] ENDOSCOPY;  Service: Endoscopy;  Laterality: N/A;  . EXCISION HYDRADENITIS LABIA Left 11/19/2016   Procedure: EXCISION LABIAL MASS;  Surgeon: Robert Bellow, MD;  Location: [facility] ORS;  Service: General;  Laterality: Left;  . EYE SURGERY Bilateral    cataracts  . HEEL SPUR EXCISION    . HIP ARTHROPLASTY Left 02/23/2018   Procedure: ARTHROPLASTY BIPOLAR HIP (HEMIARTHROPLASTY);  Surgeon: Thornton Park, MD;  Location: [facility] ORS;  Service: Orthopedics;  Laterality: Left;  Marland Kitchen MASS EXCISION Left 11/19/2016   Procedure: EXCISION LEFT THIGH MELANOMA;  Surgeon: Robert Bellow, MD;  Location: [facility] ORS;  Service: General;  Laterality: Left;  . SENTINEL NODE BIOPSY Left 11/19/2016   Procedure: INGUINAL SENTINEL NODE BIOPSY;  Surgeon: Robert Bellow, MD;  Location: [facility] ORS;  Service: General;  Laterality: Left;  . UPPER ESOPHAGEAL ENDOSCOPIC ULTRASOUND (EUS) N/A 01/29/2017   Procedure: UPPER ESOPHAGEAL ENDOSCOPIC ULTRASOUND (EUS);  Surgeon: Holly Bodily, MD;  Location[facility] ENDOSCOPY;  Service: Endoscopy;  Laterality: N/A;    Allergies  Allergen Reactions  . Remicade [Infliximab] Shortness Of Breath and Itching  . Sulfa Antibiotics Other (See Comments)    Other Reaction: tounge cracked  . Actonel [Risedronate Sodium] Other (See Comments)  . Fosamax [Alendronate Sodium]     GI Bleed   . Procardia [Nifedipine] Hives  . Sulfur Swelling  . Wellbutrin [Bupropion] Anxiety    Allergies as of 02/26/2018      Reactions   Remicade  [infliximab] Shortness Of Breath, Itching   Sulfa Antibiotics Other (See Comments)   Other Reaction: tounge cracked   Actonel [risedronate Sodium] Other (See Comments)   Fosamax [alendronate Sodium]    GI Bleed   Procardia [nifedipine] Hives   Sulfur Swelling   Wellbutrin [bupropion] Anxiety      Medication List        Accurate as of 02/26/18 10:27 AM. Always use your most recent med list.          ALPRAZolam 0.5 MG tablet Commonly known as:  XANAX Take 0.5 mg by mouth 4 (four) times daily.   AMBIEN 10 MG tablet Generic drug:  zolpidem Take 10 mg by mouth at bedtime as needed for sleep.   amLODipine 10 MG tablet Commonly known as:  NORVASC Take 10 mg by mouth daily.   cetirizine 10 MG tablet Commonly known as:  ZYRTEC Take 10 mg by mouth daily as needed for allergies.  enoxaparin 40 MG/0.4ML injection Commonly known as:  LOVENOX Inject 0.4 mLs (40 mg total) into the skin daily.   ezetimibe 10 MG tablet Commonly known as:  ZETIA TAKE ONE TABLET BY MOUTH EVERY DAY   ferrous sulfate 325 (65 FE) MG tablet Take 1 tablet (325 mg total) by mouth 3 (three) times daily after meals.   Fish Oil 1000 MG Caps Take 1 capsule by mouth 2 (two) times daily.   FLUoxetine 20 MG capsule Commonly known as:  PROZAC Take 20 mg by mouth daily.   furosemide 20 MG tablet Commonly known as:  LASIX Take 36ms daily as needed for swelling   gabapentin 300 MG capsule Commonly known as:  NEURONTIN Take 300 mg by mouth 3 (three) times daily.   hydroxychloroquine 200 MG tablet Commonly known as:  PLAQUENIL Take 200 mg by mouth 2 (two) times daily.   imipramine 25 MG tablet Commonly known as:  TOFRANIL Take 25 mg by mouth 2 (two) times daily.   latanoprost 0.005 % ophthalmic solution Commonly known as:  XALATAN Place 1 drop into both eyes at bedtime.   MULTI-VITAMINS Tabs Take 1 tablet by mouth daily.   oxyCODONE 5 MG immediate release tablet Commonly known as:  Oxy  IR/ROXICODONE Take 1 tablet (5 mg total) by mouth every 4 (four) hours as needed for moderate pain (pain score 4-6).   polyethylene glycol packet Commonly known as:  MIRALAX / GLYCOLAX Take 17 g by mouth daily.   predniSONE 5 MG tablet Commonly known as:  DELTASONE Take 5 mg by mouth 2 (two) times daily with a meal.   simvastatin 80 MG tablet Commonly known as:  ZOCOR Take 811m orally at bedtime       Review of Systems  Unable to perform ROS: Acuity of condition  Constitutional: Positive for activity change and fatigue. Negative for appetite change, chills, diaphoresis and fever.  HENT: Negative for congestion, mouth sores, nosebleeds, postnasal drip, sneezing, sore throat, trouble swallowing and voice change.   Respiratory: Positive for shortness of breath and wheezing. Negative for apnea, cough, choking and chest tightness.   Cardiovascular: Positive for leg swelling. Negative for chest pain and palpitations.  Gastrointestinal: Negative for abdominal distention, abdominal pain, constipation, diarrhea and nausea.  Genitourinary: Negative for difficulty urinating, dysuria, frequency and urgency.  Musculoskeletal: Positive for arthralgias (typical arthritis). Negative for back pain, gait problem and myalgias.  Skin: Positive for wound. Negative for color change, pallor and rash.  Neurological: Positive for weakness. Negative for dizziness, tremors, syncope, speech difficulty, numbness and headaches.  Psychiatric/Behavioral: Negative for agitation and behavioral problems.  All other systems reviewed and are negative.   Immunization History  Administered Date(s) Administered  . Influenza Split 09/26/2015  . Influenza-Unspecified 09/15/2012, 09/20/2013, 08/29/2014, 09/26/2015, 09/09/2016, 09/17/2017  . Pneumococcal Polysaccharide-23 08/03/2009   Pertinent  Health Maintenance Due  Topic Date Due  . DEXA SCAN  01/30/2010  . PNA vac Low Risk Adult (1 of 2 - PCV13) 01/30/2010  .  MAMMOGRAM  08/20/2019  . COLONOSCOPY  01/20/2027  . INFLUENZA VACCINE  Completed   Fall Risk  06/23/2017  Falls in the past year? Yes  Number falls in past yr: 1  Injury with Fall? Yes  Comment Fractured ribs 9 weeks ago  Risk for fall due to : History of fall(s)  Follow up Education provided;Falls prevention discussed   Functional Status Survey:    Vitals:   02/26/18 1000  BP: (!) 159/66  Pulse: 92  Resp:  20  Temp: 98.1 F (36.7 C)  TempSrc: Oral  SpO2: 94%  Weight: 212 lb 9.6 oz (96.4 kg)  Height: 5' 2"  (1.575 m)   Body mass index is 38.89 kg/m. Physical Exam  Constitutional: She is oriented to person, place, and time. She appears well-developed and well-nourished. She appears lethargic. She is cooperative. She does not appear ill. She appears distressed. Nasal cannula in place.  HENT:  Head: Normocephalic and atraumatic.  Mouth/Throat: Uvula is midline, oropharynx is clear and moist and mucous membranes are normal. Mucous membranes are not pale, not dry and not cyanotic.  Eyes: Pupils are equal, round, and reactive to light. Conjunctivae, EOM and lids are normal.  Neck: Trachea normal, normal range of motion and full passive range of motion without pain. Neck supple. No JVD present. No tracheal deviation, no edema and no erythema present. No thyromegaly present.  Cardiovascular: Normal rate, regular rhythm, normal heart sounds, intact distal pulses and normal pulses. Exam reveals no gallop, no distant heart sounds and no friction rub.  No murmur heard. Pulses:      Dorsalis pedis pulses are 2+ on the right side, and 2+ on the left side.  1+ edema to LLE  Pulmonary/Chest: Accessory muscle usage present. Tachypnea noted. She is in respiratory distress. She has decreased breath sounds in the right lower field and the left lower field. She has wheezes in the right upper field, the right middle field and the left upper field. She has no rhonchi. She has no rales. She exhibits no  tenderness.  Abdominal: Soft. Normal appearance and bowel sounds are normal. She exhibits no distension and no ascites. There is no tenderness.  Musculoskeletal: She exhibits no edema.       Left hip: She exhibits decreased range of motion, decreased strength, tenderness, swelling and laceration.  Expected osteoarthritis, stiffness; Bilateral Calves soft, supple. Negative Homan's Sign. B- pedal pulses equal; left hip fracture with resulting hemiarthroplasty  Neurological: She is oriented to person, place, and time. She has normal strength. She appears lethargic.  Skin: Skin is warm and dry. Laceration (left hip incision) noted. She is not diaphoretic. No cyanosis. No pallor. Nails show no clubbing.  Psychiatric: She has a normal mood and affect. Her speech is normal and behavior is normal. Judgment and thought content normal. Cognition and memory are normal.  Nursing note and vitals reviewed.   Labs reviewed: Recent Labs    02/22/18 1552 02/24/18 0513 02/25/18 0629  NA 130* 132* 130*  K 4.3 4.7 4.8  CL 98* 102 99*  CO2 23 24 24   GLUCOSE 112* 109* 121*  BUN 25* 22* 19  CREATININE 0.92 1.16* 1.00  CALCIUM 8.2* 8.0* 8.3*   Recent Labs    02/22/18 1552  AST 32  ALT 29  ALKPHOS 88  BILITOT 0.6  PROT 6.4*  ALBUMIN 3.4*   Recent Labs    02/22/18 1552 02/24/18 0513 02/25/18 0629  WBC 11.1* 13.5* 16.2*  NEUTROABS 9.4*  --   --   HGB 10.7* 8.6* 8.3*  HCT 33.8* 27.4* 26.6*  MCV 83.7 84.2 82.8  PLT 291 204 220   No results found for: TSH No results found for: HGBA1C No results found for: CHOL, HDL, LDLCALC, LDLDIRECT, TRIG, CHOLHDL  Significant Diagnostic Results in last 30 days:  Dg Chest Port 1 View  Result Date: 02/22/2018 CLINICAL DATA:  [place] down steps.  Left hip pain EXAM: PORTABLE CHEST 1 VIEW COMPARISON:  06/02/2017 FINDINGS: Bibasilar airspace disease unchanged,  probable scarring. There may also be a component of atelectasis. Negative for heart failure or  pneumonia.  No interval change IMPRESSION: Bibasilar airspace disease unchanged and appearing to represent chronic scarring. No acute abnormality. Electronically Signed   By: Franchot Gallo M.D.   On: 02/22/2018 16:37   Dg Hip Port Unilat With Pelvis 1v Left  Result Date: 02/23/2018 CLINICAL DATA:  72 year old female with left subcapital femur fracture. Subsequent encounter. EXAM: DG HIP (WITH OR WITHOUT PELVIS) 1V PORT LEFT COMPARISON:  02/22/2018 x-ray. FINDINGS: Post total left hip replacement which appears in satisfactory position without complication noted. Vascular groove proximal to mid femoral shaft similar to preoperative exam. IMPRESSION: Post left hip replacement. Electronically Signed   By: Genia Del M.D.   On: 02/23/2018 15:05   Dg Hip Unilat With Pelvis 2-3 Views Left  Result Date: 02/22/2018 CLINICAL DATA:  Fall.  Hip pain EXAM: DG HIP (WITH OR WITHOUT PELVIS) 2-3V LEFT COMPARISON:  None. FINDINGS: Subcapital femur fracture on the left with mild displacement and impaction. Left hip joint normal. IMPRESSION: Subcapital fracture left femur Electronically Signed   By: Franchot Gallo M.D.   On: 02/22/2018 16:33    Assessment/Plan Respiratory distress  Duo nebs 3 mL x2 back to back doses now  Duo nebs 3 mL every 4 hours as needed wheezing, dyspnea  Stat CT angios of the chest to rule out PE  Insert and maintain peripheral IV  Solu-Medrol 125 mg IV x1 now  Rocephin 2 g IV x1 upon return from CT scan  Labs  Continue oxygen 3-1/2 L per nasal cannula, maintain sats greater than 92%  Subsequent orders to follow depending on test results  Closed fracture of left hip, initial encounter ([place]) S/P hip hemiarthroplasty  When respirations are stable, continue PT/OT  Continue exercises as taught by PT/OT  Ice pack to the hip 4 times daily and as needed  Continue oxycodone 5 mg p.o. every 4 hours as needed pain  Continue Lovenox 40 mg subcu every 24 hours for DVT  prophylaxis  Change dressing to the hip upon return from CT and then as needed  Follow-up with orthopedist as instructed  Family/ staff Communication:   Total Time: 55 minutes  Documentation: 10 minutes  Face to Face: 45 minutes  Family/Phone: Son at bedside   Labs/tests ordered: CBC, met C, BNP, UA, C & S, CT angio chest  Medication list reviewed and assessed for continued appropriateness.  Vikki Ports, NP-C Geriatrics [facility] [phone], [place] [address] Cell Phone (Mon-Fri 8am-5pm):  [phone] On Call:  [phone] & follow prompts after 5pm & weekends Office Phone:  [phone] Office Fax:  [phone]

## 2018-02-27 ENCOUNTER — Other Ambulatory Visit
Admission: RE | Admit: 2018-02-27 | Discharge: 2018-02-27 | Disposition: A | Discharge status: Home / Self Care | Payer: Medicare Other | Source: Ambulatory Visit | Attending: Gerontology | Admitting: Gerontology

## 2018-02-27 DIAGNOSIS — D649 Anemia, unspecified: Secondary | ICD-10-CM | POA: Insufficient documentation

## 2018-02-27 DIAGNOSIS — S72012D Unspecified intracapsular fracture of left femur, subsequent encounter for closed fracture with routine healing: Secondary | ICD-10-CM | POA: Insufficient documentation

## 2018-02-27 DIAGNOSIS — X58XXXD Exposure to other specified factors, subsequent encounter: Secondary | ICD-10-CM | POA: Insufficient documentation

## 2018-02-27 DIAGNOSIS — E871 Hypo-osmolality and hyponatremia: Secondary | ICD-10-CM | POA: Insufficient documentation

## 2018-02-27 LAB — CBC WITH DIFFERENTIAL/PLATELET
BASOS ABS: 0.1 10*3/uL (ref 0–0.1)
Basophils Relative: 0 %
EOS PCT: 0 %
Eosinophils Absolute: 0 10*3/uL (ref 0–0.7)
HEMATOCRIT: 21.3 % — AB (ref 35.0–47.0)
Hemoglobin: 6.9 g/dL — ABNORMAL LOW (ref 12.0–16.0)
Lymphocytes Relative: 4 %
Lymphs Abs: 0.5 10*3/uL — ABNORMAL LOW (ref 1.0–3.6)
MCH: 26.9 pg (ref 26.0–34.0)
MCHC: 32.2 g/dL (ref 32.0–36.0)
MCV: 83.8 fL (ref 80.0–100.0)
MONO ABS: 1.6 10*3/uL — AB (ref 0.2–0.9)
Monocytes Relative: 11 %
NEUTROS ABS: 12.4 10*3/uL — AB (ref 1.4–6.5)
Neutrophils Relative %: 85 %
PLATELETS: 252 10*3/uL (ref 150–440)
RBC: 2.55 MIL/uL — ABNORMAL LOW (ref 3.80–5.20)
RDW: 17.4 % — AB (ref 11.5–14.5)
WBC: 14.5 10*3/uL — ABNORMAL HIGH (ref 3.6–11.0)

## 2018-02-27 LAB — BASIC METABOLIC PANEL
ANION GAP: 10 (ref 5–15)
BUN: 30 mg/dL — ABNORMAL HIGH (ref 6–20)
CALCIUM: 8.5 mg/dL — AB (ref 8.9–10.3)
CO2: 24 mmol/L (ref 22–32)
CREATININE: 0.98 mg/dL (ref 0.44–1.00)
Chloride: 99 mmol/L — ABNORMAL LOW (ref 101–111)
GFR calc Af Amer: >60 mL/min (ref >60)
GFR, EST NON AFRICAN AMERICAN: 56 mL/min — AB (ref >60)
Glucose, Bld: 112 mg/dL — ABNORMAL HIGH (ref 65–99)
Potassium: 5.3 mmol/L — ABNORMAL HIGH (ref 3.5–5.1)
SODIUM: 133 mmol/L — AB (ref 135–145)

## 2018-02-28 ENCOUNTER — Other Ambulatory Visit: Payer: Self-pay

## 2018-02-28 ENCOUNTER — Inpatient Hospital Stay
Admission: EM | Admit: 2018-02-28 | Discharge: 2018-03-03 | DRG: 811 | Disposition: A | Discharge status: Skilled Nursing Facility | Payer: Medicare Other | Attending: Internal Medicine | Admitting: Internal Medicine

## 2018-02-28 ENCOUNTER — Encounter: Payer: Self-pay | Admitting: Emergency Medicine

## 2018-02-28 ENCOUNTER — Other Ambulatory Visit
Admission: RE | Admit: 2018-02-28 | Discharge: 2018-02-28 | Disposition: A | Discharge status: Home / Self Care | Payer: Medicare Other | Source: Skilled Nursing Facility | Attending: *Deleted | Admitting: *Deleted

## 2018-02-28 DIAGNOSIS — K2100 Gastro-esophageal reflux disease with esophagitis, without bleeding: Secondary | ICD-10-CM

## 2018-02-28 DIAGNOSIS — K3189 Other diseases of stomach and duodenum: Secondary | ICD-10-CM

## 2018-02-28 DIAGNOSIS — Z7952 Long term (current) use of systemic steroids: Secondary | ICD-10-CM

## 2018-02-28 DIAGNOSIS — K319 Disease of stomach and duodenum, unspecified: Secondary | ICD-10-CM | POA: Diagnosis present

## 2018-02-28 DIAGNOSIS — J690 Pneumonitis due to inhalation of food and vomit: Secondary | ICD-10-CM | POA: Diagnosis present

## 2018-02-28 DIAGNOSIS — E782 Mixed hyperlipidemia: Secondary | ICD-10-CM

## 2018-02-28 DIAGNOSIS — D500 Iron deficiency anemia secondary to blood loss (chronic): Principal | ICD-10-CM | POA: Diagnosis present

## 2018-02-28 DIAGNOSIS — I444 Left anterior fascicular block: Secondary | ICD-10-CM | POA: Diagnosis present

## 2018-02-28 DIAGNOSIS — D649 Anemia, unspecified: Secondary | ICD-10-CM | POA: Diagnosis present

## 2018-02-28 DIAGNOSIS — M349 Systemic sclerosis, unspecified: Secondary | ICD-10-CM | POA: Diagnosis present

## 2018-02-28 DIAGNOSIS — E785 Hyperlipidemia, unspecified: Secondary | ICD-10-CM | POA: Diagnosis present

## 2018-02-28 DIAGNOSIS — K922 Gastrointestinal hemorrhage, unspecified: Secondary | ICD-10-CM

## 2018-02-28 DIAGNOSIS — Z96642 Presence of left artificial hip joint: Secondary | ICD-10-CM | POA: Diagnosis present

## 2018-02-28 DIAGNOSIS — M25559 Pain in unspecified hip: Secondary | ICD-10-CM

## 2018-02-28 DIAGNOSIS — I2510 Atherosclerotic heart disease of native coronary artery without angina pectoris: Secondary | ICD-10-CM | POA: Diagnosis present

## 2018-02-28 DIAGNOSIS — I10 Essential (primary) hypertension: Secondary | ICD-10-CM | POA: Diagnosis present

## 2018-02-28 DIAGNOSIS — J84112 Idiopathic pulmonary fibrosis: Secondary | ICD-10-CM | POA: Diagnosis present

## 2018-02-28 DIAGNOSIS — Z85820 Personal history of malignant melanoma of skin: Secondary | ICD-10-CM

## 2018-02-28 DIAGNOSIS — Z90710 Acquired absence of both cervix and uterus: Secondary | ICD-10-CM

## 2018-02-28 DIAGNOSIS — I25118 Atherosclerotic heart disease of native coronary artery with other forms of angina pectoris: Secondary | ICD-10-CM | POA: Diagnosis present

## 2018-02-28 DIAGNOSIS — Z882 Allergy status to sulfonamides status: Secondary | ICD-10-CM

## 2018-02-28 DIAGNOSIS — Z888 Allergy status to other drugs, medicaments and biological substances status: Secondary | ICD-10-CM

## 2018-02-28 DIAGNOSIS — T18198A Other foreign object in esophagus causing other injury, initial encounter: Secondary | ICD-10-CM | POA: Diagnosis present

## 2018-02-28 DIAGNOSIS — T18128A Food in esophagus causing other injury, initial encounter: Secondary | ICD-10-CM

## 2018-02-28 DIAGNOSIS — M069 Rheumatoid arthritis, unspecified: Secondary | ICD-10-CM | POA: Diagnosis present

## 2018-02-28 LAB — CBC WITH DIFFERENTIAL/PLATELET
BAND NEUTROPHILS: 0 %
BASOS PCT: 0 %
BLASTS: 0 %
Band Neutrophils: 4 %
Basophils Absolute: 0 10*3/uL (ref 0–0.1)
Basophils Absolute: 0 10*3/uL (ref 0–0.1)
Basophils Relative: 0 %
Blasts: 0 %
EOS PCT: 0 %
Eosinophils Absolute: 0 10*3/uL (ref 0–0.7)
Eosinophils Absolute: 0 10*3/uL (ref 0–0.7)
Eosinophils Relative: 0 %
HCT: 20.2 % — ABNORMAL LOW (ref 35.0–47.0)
HEMATOCRIT: 19.2 % — AB (ref 35.0–47.0)
HEMOGLOBIN: 6 g/dL — AB (ref 12.0–16.0)
HEMOGLOBIN: 6.4 g/dL — AB (ref 12.0–16.0)
LYMPHS PCT: 9 %
LYMPHS PCT: 5 %
Lymphs Abs: 1.5 10*3/uL (ref 1.0–3.6)
Lymphs Abs: 0.9 10*3/uL — ABNORMAL LOW (ref 1.0–3.6)
MCH: 26.1 pg (ref 26.0–34.0)
MCH: 26.2 pg (ref 26.0–34.0)
MCHC: 31.3 g/dL — AB (ref 32.0–36.0)
MCHC: 31.5 g/dL — AB (ref 32.0–36.0)
MCV: 83.2 fL (ref 80.0–100.0)
MCV: 83 fL (ref 80.0–100.0)
MONO ABS: 0.7 10*3/uL (ref 0.2–0.9)
MONOS PCT: 4 %
MYELOCYTES: 0 %
Metamyelocytes Relative: 2 %
Metamyelocytes Relative: 3 %
Monocytes Absolute: 0.8 10*3/uL (ref 0.2–0.9)
Monocytes Relative: 5 %
Myelocytes: 3 %
NEUTROS ABS: 16.9 10*3/uL — AB (ref 1.4–6.5)
NEUTROS PCT: 80 %
NEUTROS PCT: 85 %
NRBC: 0 /100{WBCs}
NRBC: 2 /100{WBCs} — AB
Neutro Abs: 14.6 10*3/uL — ABNORMAL HIGH (ref 1.4–6.5)
OTHER: 0 %
OTHER: 0 %
PROMYELOCYTES ABS: 0 %
Platelets: 279 10*3/uL (ref 150–440)
Platelets: 345 10*3/uL (ref 150–440)
Promyelocytes Absolute: 0 %
RBC: 2.31 MIL/uL — ABNORMAL LOW (ref 3.80–5.20)
RBC: 2.43 MIL/uL — ABNORMAL LOW (ref 3.80–5.20)
RDW: 17 % — ABNORMAL HIGH (ref 11.5–14.5)
RDW: 17.2 % — AB (ref 11.5–14.5)
WBC: 16.9 10*3/uL — AB (ref 3.6–11.0)
WBC: 18.5 10*3/uL — ABNORMAL HIGH (ref 3.6–11.0)

## 2018-02-28 LAB — COMPREHENSIVE METABOLIC PANEL
ALBUMIN: 2.3 g/dL — AB (ref 3.5–5.0)
ALK PHOS: 128 U/L — AB (ref 38–126)
ALT: 109 U/L — ABNORMAL HIGH (ref 14–54)
ANION GAP: 11 (ref 5–15)
AST: 102 U/L — ABNORMAL HIGH (ref 15–41)
BILIRUBIN TOTAL: 0.3 mg/dL (ref 0.3–1.2)
BUN: 38 mg/dL — ABNORMAL HIGH (ref 6–20)
CALCIUM: 8 mg/dL — AB (ref 8.9–10.3)
CO2: 22 mmol/L (ref 22–32)
Chloride: 101 mmol/L (ref 101–111)
Creatinine, Ser: 0.82 mg/dL (ref 0.44–1.00)
GFR calc Af Amer: >60 mL/min (ref >60)
GFR calc non Af Amer: >60 mL/min (ref >60)
GLUCOSE: 158 mg/dL — AB (ref 65–99)
Potassium: 5 mmol/L (ref 3.5–5.1)
SODIUM: 134 mmol/L — AB (ref 135–145)
TOTAL PROTEIN: 6.1 g/dL — AB (ref 6.5–8.1)

## 2018-02-28 LAB — BASIC METABOLIC PANEL
Anion gap: 8 (ref 5–15)
BUN: 38 mg/dL — AB (ref 6–20)
CHLORIDE: 101 mmol/L (ref 101–111)
CO2: 23 mmol/L (ref 22–32)
CREATININE: 1.01 mg/dL — AB (ref 0.44–1.00)
Calcium: 8 mg/dL — ABNORMAL LOW (ref 8.9–10.3)
GFR calc non Af Amer: 54 mL/min — ABNORMAL LOW (ref >60)
Glucose, Bld: 130 mg/dL — ABNORMAL HIGH (ref 65–99)
POTASSIUM: 4.9 mmol/L (ref 3.5–5.1)
SODIUM: 132 mmol/L — AB (ref 135–145)

## 2018-02-28 LAB — PREPARE RBC (CROSSMATCH)

## 2018-02-28 MED ORDER — PANTOPRAZOLE SODIUM 40 MG IV SOLR
40.0000 mg | Freq: Once | INTRAVENOUS | Status: AC
  Administered 2018-02-28: 40 mg via INTRAVENOUS
  Filled 2018-02-28: qty 40

## 2018-02-28 MED ORDER — SODIUM CHLORIDE 0.9 % IV SOLN
10.0000 mL/h | Freq: Once | INTRAVENOUS | Status: AC
Start: 2018-02-28 — End: 2018-03-01
  Administered 2018-02-28: 10 mL/h via INTRAVENOUS

## 2018-02-28 NOTE — ED Notes (Signed)
Lab called, blood ready.

## 2018-02-28 NOTE — ED Provider Notes (Signed)
[facility] Emergency Department Provider Note   ____________________________________________   First MD Initiated Contact with Patient 02/28/18 2010     (approximate)  I have reviewed the triage vital signs and the nursing notes.   HISTORY  Chief Complaint Abnormal Lab    HPI Erin [name] is a 72 y.o. female Patient sent from the nursing home for anemia. Patient reports she had bleeding ulcers in the past. She had a recent hip fracture and replacement. She's been in the nursing home and complains of no stool for a week. She reports she feels okay otherwise.   Past Medical History:  Diagnosis Date  . Anemia    h/o  . Cancer ([place])    melanoma x 2 the last one was of her leg and surgeon removed it  . Colon polyp   . Coronary artery calcification   . Coronary artery disease   . DDD (degenerative disc disease), lumbar   . Diverticulosis 01/20/2017  . Dysphagia   . Dyspnea    with exertion-unable to walk a mile without getting sob- dr sparks set pt up to see Dr Rockey Situ after 11-19-16 surgery  . Environmental allergies   . Esophageal dysmotility   . Fibrocystic breast disease   . GERD (gastroesophageal reflux disease)   . Heart murmur    h/o   . Hyperlipidemia    unspecified  . Hypertension   . Mitral regurgitation   . Monilial esophagitis ([employer]) 01/20/2017  . Obstipation   . Osteopenia   . Peptic ulcer disease   . Pulmonary fibrosis ([place])    per Dr Raul Del  . RA (rheumatoid arthritis) ([place])   . Raynaud's disease   . Redundant colon 01/20/2017  . Scleroderma ([place])   . Telangiectasia of colon   . Tubular adenoma of colon 01/20/2017   unspecified  . Vulvar dysplasia     Patient Active Problem List   Diagnosis Date Noted  . S/P hip hemiarthroplasty 02/26/2018  . Hip fracture ([place]) 02/22/2018  . Hyperlipidemia 10/26/2017  . [name] (coronary artery disease), native coronary artery 10/24/2017  . Vulvar irritation 09/02/2017  . Vulvar  dysplasia 01/28/2017  . 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 Screening for cardiovascular condition 12/15/2016  . Shortness of breath 12/15/2016  . Idiopathic interstitial fibrosis of lung syndrome ([place]) 12/15/2016  . Scleroderma ([place]) 12/15/2016  . Raynaud's disease without gangrene 12/15/2016  . Melanoma of skin ([place]) 11/11/2016  . Lesion of labia 11/11/2016    Past Surgical History:  Procedure Laterality Date  . ABDOMINAL HYSTERECTOMY    . BREAST CYST EXCISION Left 20+ years ago   No scar visible  . BREAST LUMPECTOMY Right   . CARPAL TUNNEL RELEASE Bilateral   . COLON SURGERY     colon polyp   . COLONOSCOPY  09/30/2011   tubular adenoma rtm  . COLONOSCOPY     05/02/2003, 04/10/2000  . COLONOSCOPY WITH PROPOFOL N/A 01/20/2017   Procedure: COLONOSCOPY WITH PROPOFOL;  Surgeon: Lollie Sails, MD;  Location: [facility] ENDOSCOPY;  Service: Endoscopy;  Laterality: N/A;  . ESOPHAGOGASTRODUODENOSCOPY    . ESOPHAGOGASTRODUODENOSCOPY     09/30/2011, 04/10/2000 , no repeat rtm  . ESOPHAGOGASTRODUODENOSCOPY (EGD) WITH PROPOFOL N/A 01/20/2017   Procedure: ESOPHAGOGASTRODUODENOSCOPY (EGD) WITH PROPOFOL;  Surgeon: Lollie Sails, MD;  Location: [facility] ENDOSCOPY;  Service: Endoscopy;  Laterality: N/A;  . EXCISION HYDRADENITIS LABIA Left 11/19/2016   Procedure: EXCISION LABIAL MASS;  Surgeon: Dellis Filbert  Amedeo Kinsman, MD;  Location: [facility] ORS;  Service: General;  Laterality: Left;  . EYE SURGERY Bilateral    cataracts  . HEEL SPUR EXCISION    . HIP ARTHROPLASTY Left 02/23/2018   Procedure: ARTHROPLASTY BIPOLAR HIP (HEMIARTHROPLASTY);  Surgeon: Thornton Park, MD;  Location: [facility] ORS;  Service: Orthopedics;  Laterality: Left;  Marland Kitchen MASS EXCISION Left 11/19/2016   Procedure: EXCISION LEFT THIGH MELANOMA;  Surgeon: Robert Bellow, MD;  Location: [facility] ORS;  Service: General;  Laterality: Left;  . SENTINEL NODE BIOPSY Left 11/19/2016   Procedure:  INGUINAL SENTINEL NODE BIOPSY;  Surgeon: Robert Bellow, MD;  Location: [facility] ORS;  Service: General;  Laterality: Left;  . UPPER ESOPHAGEAL ENDOSCOPIC ULTRASOUND (EUS) N/A 01/29/2017   Procedure: UPPER ESOPHAGEAL ENDOSCOPIC ULTRASOUND (EUS);  Surgeon: Holly Bodily, MD;  Location: [facility] ENDOSCOPY;  Service: Endoscopy;  Laterality: N/A;    Prior to Admission medications   Medication Sig Start Date End Date Taking? Authorizing Provider  ALPRAZolam Duanne Moron) 0.5 MG tablet Take 0.5 mg by mouth 4 (four) times daily.     [provider]  amLODipine (NORVASC) 10 MG tablet Take 10 mg by mouth daily.     [provider]  cetirizine (ZYRTEC) 10 MG tablet Take 10 mg by mouth daily as needed for allergies.    [provider]  enoxaparin (LOVENOX) 40 MG/0.4ML injection Inject 0.4 mLs (40 mg total) into the skin daily. 02/26/18   Fritzi Mandes, MD  ezetimibe (ZETIA) 10 MG tablet TAKE ONE TABLET BY MOUTH EVERY DAY 11/16/17   Minna Merritts, MD  ferrous sulfate 325 (65 FE) MG tablet Take 1 tablet (325 mg total) by mouth 3 (three) times daily after meals. 02/25/18   Fritzi Mandes, MD  FLUoxetine (PROZAC) 20 MG capsule Take 20 mg by mouth daily.    [provider]  furosemide (LASIX) 20 MG tablet Take 20mg s daily as needed for swelling 08/04/16   [provider]  gabapentin (NEURONTIN) 300 MG capsule Take 300 mg by mouth 3 (three) times daily.  03/16/17 03/16/18  [provider]  hydroxychloroquine (PLAQUENIL) 200 MG tablet Take 200 mg by mouth 2 (two) times daily.  03/16/17   [provider]  imipramine (TOFRANIL) 25 MG tablet Take 25 mg by mouth 2 (two) times daily.     [provider]  latanoprost (XALATAN) 0.005 % ophthalmic solution Place 1 drop into both eyes at bedtime.     [provider]  Multiple Vitamin (MULTI-VITAMINS) TABS Take 1 tablet by mouth daily.     [provider]  Omega-3 Fatty Acids (FISH OIL) 1000 MG CAPS  Take 1 capsule by mouth 2 (two) times daily.     [provider]  oxyCODONE (OXY IR/ROXICODONE) 5 MG immediate release tablet Take 1 tablet (5 mg total) by mouth every 4 (four) hours as needed for moderate pain (pain score 4-6). 02/25/18   Toni Arthurs, NP  polyethylene glycol (MIRALAX / GLYCOLAX) packet Take 17 g by mouth daily.     [provider]  predniSONE (DELTASONE) 5 MG tablet Take 5 mg by mouth 2 (two) times daily with a meal.     [provider]  simvastatin (ZOCOR) 80 MG tablet Take 80mg s orally at bedtime 05/22/16   [provider]  zolpidem (AMBIEN) 10 MG tablet Take 10 mg by mouth at bedtime as needed for sleep.    [provider]    Allergies Remicade [infliximab]; Sulfa antibiotics; Actonel [risedronate  sodium]; Fosamax [alendronate sodium]; Procardia [nifedipine]; Sulfur; and Wellbutrin [bupropion]  Family History  Problem Relation Age of Onset  . Endometrial cancer Mother 73  . Hypertension Mother   . Osteoporosis Mother   . Bladder Cancer Father   . COPD Father   . Cerebral palsy Sister     Social History Social History   Tobacco Use  . Smoking status: Never Smoker  . Smokeless tobacco: Never Used  Substance Use Topics  . Alcohol use: No  . Drug use: No    Review of Systems Constitutional: No fever/chills Eyes: No visual changes. ENT: No sore throat. Cardiovascular: Denies chest pain. Respiratory: Denies shortness of breath. Gastrointestinal: No abdominal pain.  No nausea, no vomiting.  No diarrhea.  No constipation. Genitourinary: Negative for dysuria. Musculoskeletal: Negative for back pain. Skin: Negative for rash. Neurological: Negative for headaches, focal weakness   ____________________________________________   PHYSICAL EXAM:  VITAL SIGNS: ED Triage Vitals  Enc Vitals Group     BP 02/28/18 2023 135/67     Pulse Rate 02/28/18 2023 86     Resp 02/28/18 2023 (!) 23     Temp 02/28/18 2023 98.4 F  (36.9 C)     Temp Source 02/28/18 2023 Oral     SpO2 02/28/18 2008 99 %     Weight --      Height --      Head Circumference --      Peak Flow --      Pain Score 02/28/18 2024 0     Pain Loc --      Pain Edu? --      Excl. in [place]? --     Constitutional: Alert  Well appearing and in no acute distress. Eyes: Conjunctivae are normal.  Head: Atraumatic. Nose: No congestion/rhinnorhea. Mouth/Throat: Mucous membranes are moist.  Oropharynx non-erythematous. Neck: No stridor. Cardiovascular: Normal rate, regular rhythm. Grossly normal heart sounds.  Good peripheral circulation. Respiratory: Normal respiratory effort.  No retractions. Lungs CTAB. Gastrointestinal: Soft and nontender. No distention. No abdominal bruits. No CVA tenderness. rectal: There is very little stool in the vault. What is there is Hemoccult-positive. There is no masses. Musculoskeletal: left hip which was the area of the surgery is tender. Left leg is swollen. Neurologic:  Normal speech and language. No gross focal neurologic deficits are appreciated.  Skin:  Skin is warm, dry and intact. No rash noted. Psychiatric: Mood and affect are normal. Speech and behavior are normal.  ____________________________________________   LABS (all labs ordered are listed, but only abnormal results are displayed)  Labs Reviewed  COMPREHENSIVE METABOLIC PANEL - Abnormal; Notable for the following components:      Result Value   Sodium 134 (*)    Glucose, Bld 158 (*)    BUN 38 (*)    Calcium 8.0 (*)    Total Protein 6.1 (*)    Albumin 2.3 (*)    AST 102 (*)    ALT 109 (*)    Alkaline Phosphatase 128 (*)    All other components within normal limits  CBC WITH DIFFERENTIAL/PLATELET - Abnormal; Notable for the following components:   WBC 18.5 (*)    RBC 2.43 (*)    Hemoglobin 6.4 (*)    HCT 20.2 (*)    MCHC 31.5 (*)    RDW 17.2 (*)    All other components within normal limits  TYPE AND SCREEN  PREPARE RBC (CROSSMATCH)    ____________________________________________  EKG EKG read and interpreted by  me shows normal sinus rhythm at 84 left axis left anterior hemiblock no acute ST-T changes  ____________________________________________  RADIOLOGY  ED MD interpretation:   Official radiology report(s): No results found.  ____________________________________________   PROCEDURES  Procedure(s) performed:   Procedures  Critical Care performed:  ____________________________________________   INITIAL IMPRESSION / ASSESSMENT AND PLAN / ED COURSE   patient sent from nursing home for anemia. Patient's weak and pale. She recently had surgery. She says she has had bleeding from gastric ulcers in the past in her stool is Hemoccult-positive. No admit her for transfusion.        ____________________________________________   FINAL CLINICAL IMPRESSION(S) / ED DIAGNOSES  Final diagnoses:  Gastrointestinal hemorrhage, unspecified gastrointestinal hemorrhage type  Symptomatic anemia     ED Discharge Orders    None       Note:  This document was prepared using Dragon voice recognition software and may include unintentional dictation errors.    Nena Polio, MD 02/28/18 2152

## 2018-02-28 NOTE — ED Triage Notes (Signed)
Pt presents from [facility] where she was placed for rehab post L hip repair. Pt subsequently developed aspiration pneumonia at the facility. Pt presents w/ abnormal lab work, low HEMOGLOBIN. Pt slightly confused but more as a poor historian than altered mental status.

## 2018-02-28 NOTE — H&P (Signed)
[place] [facility] [place] NAME: Erin [name]    MR#:  [id number]  DATE OF BIRTH:  1946-10-05  DATE OF ADMISSION:  02/28/2018  PRIMARY CARE PHYSICIAN: Idelle Crouch, MD   REQUESTING/REFERRING PHYSICIAN: Cinda Quest, MD  CHIEF COMPLAINT:   Chief Complaint  Patient presents with  . Abnormal Lab    HISTORY OF PRESENT ILLNESS:  Reathel Turi  is a 72 y.o. female who presents with anemia.  Patient is at a rehab facility after recent hip fracture repair.  She was noted on labs to have decreasing hemoglobin.  She was sent to the hospital for further evaluation.  She is on chronic prednisone for other immunologic conditions including scleroderma and rheumatoid arthritis.  She is also been on iron tablets due to her anemia.  Her guaiac test here in the ED was positive.  Patient has had some recent increasing symptoms of fatigue and shortness of breath.  However, she is also currently being treated for aspiration pneumonitis/pneumonia.  She denies any significant episodes of melena or frank GI bleeding.  PRBC transfusion was ordered in the ED and hospitalist were called for admission for further evaluation.  PAST MEDICAL HISTORY:   Past Medical History:  Diagnosis Date  . Anemia    h/o  . Cancer ([place])    melanoma x 2 the last one was of her leg and surgeon removed it  . Colon polyp   . Coronary artery calcification   . Coronary artery disease   . DDD (degenerative disc disease), lumbar   . Diverticulosis 01/20/2017  . Dysphagia   . Dyspnea    with exertion-unable to walk a mile without getting sob- dr sparks set pt up to see Dr Rockey Situ after 11-19-16 surgery  . Environmental allergies   . Esophageal dysmotility   . Fibrocystic breast disease   . GERD (gastroesophageal reflux disease)   . Heart murmur    h/o   . Hyperlipidemia    unspecified  . Hypertension   . Mitral regurgitation   . Monilial esophagitis ([place]) 01/20/2017  . Obstipation    . Osteopenia   . Peptic ulcer disease   . Pulmonary fibrosis ([place])    per Dr Raul Del  . RA (rheumatoid arthritis) ([place])   . Raynaud's disease   . Redundant colon 01/20/2017  . Scleroderma ([place])   . Telangiectasia of colon   . Tubular adenoma of colon 01/20/2017   unspecified  . Vulvar dysplasia      PAST SURGICAL HISTORY:   Past Surgical History:  Procedure Laterality Date  . ABDOMINAL HYSTERECTOMY    . BREAST CYST EXCISION Left 20+ years ago   No scar visible  . BREAST LUMPECTOMY Right   . CARPAL TUNNEL RELEASE Bilateral   . COLON SURGERY     colon polyp   . COLONOSCOPY  09/30/2011   tubular adenoma rtm  . COLONOSCOPY     05/02/2003, 04/10/2000  . COLONOSCOPY WITH PROPOFOL N/A 01/20/2017   Procedure: COLONOSCOPY WITH PROPOFOL;  Surgeon: Lollie Sails, MD;  Location: [facility] ENDOSCOPY;  Service: Endoscopy;  Laterality: N/A;  . ESOPHAGOGASTRODUODENOSCOPY    . ESOPHAGOGASTRODUODENOSCOPY     09/30/2011, 04/10/2000 , no repeat rtm  . ESOPHAGOGASTRODUODENOSCOPY (EGD) WITH PROPOFOL N/A 01/20/2017   Procedure: ESOPHAGOGASTRODUODENOSCOPY (EGD) WITH PROPOFOL;  Surgeon: Lollie Sails, MD;  Location: [facility] ENDOSCOPY;  Service: Endoscopy;  Laterality: N/A;  . EXCISION HYDRADENITIS LABIA Left 11/19/2016   Procedure: EXCISION LABIAL MASS;  Surgeon:  Robert Bellow, MD;  Location: [facility] ORS;  Service: General;  Laterality: Left;  . EYE SURGERY Bilateral    cataracts  . HEEL SPUR EXCISION    . HIP ARTHROPLASTY Left 02/23/2018   Procedure: ARTHROPLASTY BIPOLAR HIP (HEMIARTHROPLASTY);  Surgeon: Thornton Park, MD;  Location: [facility] ORS;  Service: Orthopedics;  Laterality: Left;  Marland Kitchen MASS EXCISION Left 11/19/2016   Procedure: EXCISION LEFT THIGH MELANOMA;  Surgeon: Robert Bellow, MD;  Location: [facility] ORS;  Service: General;  Laterality: Left;  . SENTINEL NODE BIOPSY Left 11/19/2016   Procedure: INGUINAL SENTINEL NODE BIOPSY;  Surgeon: Robert Bellow, MD;  Location: [facility] ORS;  Service:  General;  Laterality: Left;  . UPPER ESOPHAGEAL ENDOSCOPIC ULTRASOUND (EUS) N/A 01/29/2017   Procedure: UPPER ESOPHAGEAL ENDOSCOPIC ULTRASOUND (EUS);  Surgeon: Holly Bodily, MD;  Location: [facility] ENDOSCOPY;  Service: Endoscopy;  Laterality: N/A;     SOCIAL HISTORY:   Social History   Tobacco Use  . Smoking status: Never Smoker  . Smokeless tobacco: Never Used  Substance Use Topics  . Alcohol use: No     FAMILY HISTORY:   Family History  Problem Relation Age of Onset  . Endometrial cancer Mother 36  . Hypertension Mother   . Osteoporosis Mother   . Bladder Cancer Father   . COPD Father   . Cerebral palsy Sister      DRUG ALLERGIES:   Allergies  Allergen Reactions  . Remicade [Infliximab] Shortness Of Breath and Itching  . Sulfa Antibiotics Other (See Comments)    Other Reaction: tounge cracked  . Actonel [Risedronate Sodium] Other (See Comments)  . Fosamax [Alendronate Sodium]     GI Bleed   . Procardia [Nifedipine] Hives  . Sulfur Swelling  . Wellbutrin [Bupropion] Anxiety    MEDICATIONS AT HOME:   Prior to Admission medications   Medication Sig Start Date End Date Taking? Authorizing Provider  ALPRAZolam Duanne Moron) 0.5 MG tablet Take 0.5 mg by mouth every 6 (six) hours.    Yes [provider]  amLODipine (NORVASC) 10 MG tablet Take 5 mg by mouth 2 (two) times daily.    Yes [provider]  aspirin EC 81 MG tablet Take 81 mg by mouth at bedtime.   Yes [provider]  cefTRIAXone (ROCEPHIN) 1-3.74 GM-%(50ML) IVPB Inject 1 g into the vein daily. 02/27/18 03/05/18 Yes [provider]  cetirizine (ZYRTEC) 10 MG tablet Take 10 mg by mouth daily as needed for allergies.    Yes [provider]  enoxaparin (LOVENOX) 40 MG/0.4ML injection Inject 0.4 mLs (40 mg total) into the skin daily. 02/26/18  Yes Fritzi Mandes, MD  ezetimibe (ZETIA) 10 MG tablet TAKE ONE TABLET BY MOUTH EVERY DAY 11/16/17  Yes Gollan, Kathlene November, MD   ferrous sulfate 325 (65 FE) MG tablet Take 1 tablet (325 mg total) by mouth 3 (three) times daily after meals. 02/25/18  Yes Fritzi Mandes, MD  FLUoxetine (PROZAC) 20 MG capsule Take 20 mg by mouth daily.    Yes [provider]  furosemide (LASIX) 20 MG tablet Take 20mg s daily as needed for swelling 08/04/16  Yes [provider]  gabapentin (NEURONTIN) 300 MG capsule Take 300 mg by mouth 3 (three) times daily.  03/16/17 03/16/18 Yes [provider]  guaifenesin (ROBITUSSIN) 100 MG/5ML syrup Take 200 mg by mouth every 4 (four) hours as needed for cough.   Yes [provider]  hydroxychloroquine (PLAQUENIL) 200 MG tablet Take 200 mg by mouth 2 (  two) times daily.  03/16/17  Yes [provider]  imipramine (TOFRANIL) 25 MG tablet Take 25 mg by mouth 2 (two) times daily.   Yes [provider]  ipratropium-albuterol (DUONEB) 0.5-2.5 (3) MG/3ML SOLN Take 3 mLs by nebulization every 4 (four) hours as needed (wheezing, dyspnea).   Yes [provider]  latanoprost (XALATAN) 0.005 % ophthalmic solution Place 1 drop into both eyes at bedtime.    Yes [provider]  metroNIDAZOLE (FLAGYL) 500 MG tablet Take 500 mg by mouth 3 (three) times daily. 02/26/18 03/05/18 Yes [provider]  Multiple Vitamin (MULTI-VITAMINS) TABS Take 1 tablet by mouth daily.    Yes [provider]  Omega-3 Fatty Acids (FISH OIL) 1000 MG CAPS Take 1 capsule by mouth 2 (two) times daily.    Yes [provider]  oxyCODONE (OXY IR/ROXICODONE) 5 MG immediate release tablet Take 1 tablet (5 mg total) by mouth every 4 (four) hours as needed for moderate pain (pain score 4-6). 02/25/18  Yes Toni Arthurs, NP  polyethylene glycol (MIRALAX / GLYCOLAX) packet Take 17 g by mouth daily.    Yes [provider]  predniSONE (DELTASONE) 20 MG tablet Take 40 mg by mouth daily with breakfast.   Yes [provider]  predniSONE (DELTASONE) 5 MG tablet  Take 5 mg by mouth 2 (two) times daily with a meal.    Yes [provider]  simvastatin (ZOCOR) 80 MG tablet Take 80mg s orally at bedtime 05/22/16  Yes [provider]  sodium chloride 0.9 % infusion Inject 65 mLs into the vein continuous. 02/26/18 03/01/18 Yes [provider]  sorbitol 70 % solution Take 30 mLs by mouth every 2 (two) hours as needed (constipation).   Yes [provider]    REVIEW OF SYSTEMS:  Review of Systems  Constitutional: Positive for malaise/fatigue. Negative for chills, fever and weight loss.  HENT: Negative for ear pain, hearing loss and tinnitus.   Eyes: Negative for blurred vision, double vision, pain and redness.  Respiratory: Positive for shortness of breath. Negative for cough and hemoptysis.   Cardiovascular: Negative for chest pain, palpitations, orthopnea and leg swelling.  Gastrointestinal: Negative for abdominal pain, constipation, diarrhea, nausea and vomiting.  Genitourinary: Negative for dysuria, frequency and hematuria.  Musculoskeletal: Negative for back pain, joint pain and neck pain.  Skin:       No acne, rash, or lesions  Neurological: Negative for dizziness, tremors, focal weakness and weakness.  Endo/Heme/Allergies: Negative for polydipsia. Does not bruise/bleed easily.  Psychiatric/Behavioral: Negative for depression. The patient is not nervous/anxious and does not have insomnia.      VITAL SIGNS:   Vitals:   02/28/18 2008 02/28/18 2023 02/28/18 2026  BP:  135/67   Pulse:  86   Resp:  (!) 23   Temp:  98.4 F (36.9 C)   TempSrc:  Oral   SpO2: 99% 100%   Weight:   96.2 kg (212 lb)  Height:   5\' 2"  (1.575 m)   Wt Readings from Last 3 Encounters:  02/28/18 96.2 kg (212 lb)  02/26/18 96.4 kg (212 lb 9.6 oz)  02/23/18 86.2 kg (190 lb)    PHYSICAL EXAMINATION:  Physical Exam  Vitals reviewed. Constitutional: She is oriented to person, place, and time. She appears well-developed and well-nourished.  No distress.  HENT:  Head: Normocephalic and atraumatic.  Mouth/Throat: Oropharynx is clear and moist.  Eyes: Pupils are equal, round, and reactive to light. EOM are normal. No  scleral icterus.  Conjunctival pallor  Neck: Normal range of motion. Neck supple. No JVD present. No thyromegaly present.  Cardiovascular: Normal rate, regular rhythm and intact distal pulses. Exam reveals no gallop and no friction rub.  No murmur heard. Respiratory: Effort normal and breath sounds normal. No respiratory distress. She has no wheezes. She has no rales.  GI: Soft. Bowel sounds are normal. She exhibits no distension. There is no tenderness.  Musculoskeletal: Normal range of motion. She exhibits no edema.  No arthritis, no gout  Lymphadenopathy:    She has no cervical adenopathy.  Neurological: She is alert and oriented to person, place, and time. No cranial nerve deficit.  No dysarthria, no aphasia  Skin: Skin is warm and dry. No rash noted. No erythema.  Psychiatric: She has a normal mood and affect. Her behavior is normal. Judgment and thought content normal.    LABORATORY PANEL:   CBC Recent Labs  Lab 02/28/18 2031  WBC 18.5*  HGB 6.4*  HCT 20.2*  PLT 345   ------------------------------------------------------------------------------------------------------------------  Chemistries  Recent Labs  Lab 02/28/18 2031  NA 134*  K 5.0  CL 101  CO2 22  GLUCOSE 158*  BUN 38*  CREATININE 0.82  CALCIUM 8.0*  AST 102*  ALT 109*  ALKPHOS 128*  BILITOT 0.3   ------------------------------------------------------------------------------------------------------------------  Cardiac Enzymes Recent Labs  Lab 02/22/18 1552  TROPONINI <0.03   ------------------------------------------------------------------------------------------------------------------  RADIOLOGY:  No results found.  EKG:   Orders placed or performed during the hospital encounter of 02/28/18  . ED EKG  . ED  EKG    IMPRESSION AND PLAN:  Principal Problem:   Symptomatic anemia -PRBC transfusion as ordered by ED physician.  GI consult for assistance and further evaluation of potential GI source of anemia Active Problems:   Idiopathic interstitial fibrosis of lung syndrome (HCC) -continue home meds   Scleroderma ([place]) -continue home medications including immunosuppressant   [name] (coronary artery disease), native coronary artery -continue home meds   Hyperlipidemia -home dose antilipid  Chart review performed and case discussed with ED provider. Labs, imaging and/or ECG reviewed by provider and discussed with patient/family. Management plans discussed with the patient and/or family.  DVT PROPHYLAXIS: Mechanical only  GI PROPHYLAXIS: PPI  ADMISSION STATUS: Observation  CODE STATUS: Full Code Status History    Date Active Date Inactive Code Status Order ID Comments User Context   02/22/2018 1812 02/25/2018 1937 Full Code [id number]  Fritzi Mandes, MD Inpatient      TOTAL TIME TAKING CARE OF THIS PATIENT: 40 minutes.   Domnique Vanegas [place] 02/28/2018, 11:15 PM  [employer]  [phone]  CC: Primary care physician; Idelle Crouch, MD  Note:  This document was prepared using Dragon voice recognition software and may include unintentional dictation errors.

## 2018-03-01 ENCOUNTER — Other Ambulatory Visit: Payer: Self-pay

## 2018-03-01 ENCOUNTER — Ambulatory Visit: Payer: Medicare Other | Admitting: Internal Medicine

## 2018-03-01 DIAGNOSIS — Z888 Allergy status to other drugs, medicaments and biological substances status: Secondary | ICD-10-CM | POA: Diagnosis not present

## 2018-03-01 DIAGNOSIS — K319 Disease of stomach and duodenum, unspecified: Secondary | ICD-10-CM | POA: Diagnosis present

## 2018-03-01 DIAGNOSIS — T18128A Food in esophagus causing other injury, initial encounter: Secondary | ICD-10-CM | POA: Diagnosis not present

## 2018-03-01 DIAGNOSIS — Z90710 Acquired absence of both cervix and uterus: Secondary | ICD-10-CM | POA: Diagnosis not present

## 2018-03-01 DIAGNOSIS — D62 Acute posthemorrhagic anemia: Secondary | ICD-10-CM | POA: Diagnosis not present

## 2018-03-01 DIAGNOSIS — K3189 Other diseases of stomach and duodenum: Secondary | ICD-10-CM | POA: Diagnosis not present

## 2018-03-01 DIAGNOSIS — E785 Hyperlipidemia, unspecified: Secondary | ICD-10-CM | POA: Diagnosis present

## 2018-03-01 DIAGNOSIS — Z85820 Personal history of malignant melanoma of skin: Secondary | ICD-10-CM | POA: Diagnosis not present

## 2018-03-01 DIAGNOSIS — M349 Systemic sclerosis, unspecified: Secondary | ICD-10-CM | POA: Diagnosis present

## 2018-03-01 DIAGNOSIS — Z96642 Presence of left artificial hip joint: Secondary | ICD-10-CM | POA: Diagnosis present

## 2018-03-01 DIAGNOSIS — T18198A Other foreign object in esophagus causing other injury, initial encounter: Secondary | ICD-10-CM | POA: Diagnosis present

## 2018-03-01 DIAGNOSIS — K210 Gastro-esophageal reflux disease with esophagitis: Secondary | ICD-10-CM | POA: Diagnosis not present

## 2018-03-01 DIAGNOSIS — M069 Rheumatoid arthritis, unspecified: Secondary | ICD-10-CM | POA: Diagnosis present

## 2018-03-01 DIAGNOSIS — I444 Left anterior fascicular block: Secondary | ICD-10-CM | POA: Diagnosis present

## 2018-03-01 DIAGNOSIS — Z7952 Long term (current) use of systemic steroids: Secondary | ICD-10-CM | POA: Diagnosis not present

## 2018-03-01 DIAGNOSIS — J690 Pneumonitis due to inhalation of food and vomit: Secondary | ICD-10-CM | POA: Diagnosis present

## 2018-03-01 DIAGNOSIS — D649 Anemia, unspecified: Secondary | ICD-10-CM | POA: Diagnosis present

## 2018-03-01 DIAGNOSIS — Z882 Allergy status to sulfonamides status: Secondary | ICD-10-CM | POA: Diagnosis not present

## 2018-03-01 DIAGNOSIS — I2510 Atherosclerotic heart disease of native coronary artery without angina pectoris: Secondary | ICD-10-CM | POA: Diagnosis present

## 2018-03-01 DIAGNOSIS — I10 Essential (primary) hypertension: Secondary | ICD-10-CM | POA: Diagnosis present

## 2018-03-01 DIAGNOSIS — D500 Iron deficiency anemia secondary to blood loss (chronic): Secondary | ICD-10-CM | POA: Diagnosis present

## 2018-03-01 LAB — BASIC METABOLIC PANEL
Anion gap: 9 (ref 5–15)
BUN: 33 mg/dL — AB (ref 6–20)
CALCIUM: 8.4 mg/dL — AB (ref 8.9–10.3)
CHLORIDE: 101 mmol/L (ref 101–111)
CO2: 25 mmol/L (ref 22–32)
CREATININE: 0.75 mg/dL (ref 0.44–1.00)
Glucose, Bld: 82 mg/dL (ref 65–99)
Potassium: 5 mmol/L (ref 3.5–5.1)
SODIUM: 135 mmol/L (ref 135–145)

## 2018-03-01 LAB — CBC
HCT: 30.5 % — ABNORMAL LOW (ref 35.0–47.0)
Hemoglobin: 10 g/dL — ABNORMAL LOW (ref 12.0–16.0)
MCH: 28 pg (ref 26.0–34.0)
MCHC: 32.7 g/dL (ref 32.0–36.0)
MCV: 85.8 fL (ref 80.0–100.0)
Platelets: 343 10*3/uL (ref 150–440)
RBC: 3.56 MIL/uL — AB (ref 3.80–5.20)
RDW: 16.5 % — AB (ref 11.5–14.5)
WBC: 20.8 10*3/uL — ABNORMAL HIGH (ref 3.6–11.0)

## 2018-03-01 LAB — HEPATIC FUNCTION PANEL
ALBUMIN: 2.5 g/dL — AB (ref 3.5–5.0)
ALT: 95 U/L — AB (ref 14–54)
AST: 66 U/L — AB (ref 15–41)
Alkaline Phosphatase: 126 U/L (ref 38–126)
TOTAL PROTEIN: 6.1 g/dL — AB (ref 6.5–8.1)
Total Bilirubin: 0.7 mg/dL (ref 0.3–1.2)

## 2018-03-01 MED ORDER — HYDROXYCHLOROQUINE SULFATE 200 MG PO TABS
200.0000 mg | ORAL_TABLET | Freq: Two times a day (BID) | ORAL | Status: DC
Start: 2018-03-01 — End: 2018-03-03
  Administered 2018-03-01 – 2018-03-03 (×5): 200 mg via ORAL
  Filled 2018-03-01 (×5): qty 1

## 2018-03-01 MED ORDER — OXYCODONE HCL 5 MG PO TABS
5.0000 mg | ORAL_TABLET | ORAL | Status: DC | PRN
  Administered 2018-03-01 – 2018-03-03 (×2): 5 mg via ORAL
  Filled 2018-03-01 (×2): qty 1

## 2018-03-01 MED ORDER — ALPRAZOLAM 0.5 MG PO TABS
0.5000 mg | ORAL_TABLET | Freq: Four times a day (QID) | ORAL | Status: DC
  Administered 2018-03-01 – 2018-03-03 (×11): 0.5 mg via ORAL
  Filled 2018-03-01 (×11): qty 1

## 2018-03-01 MED ORDER — GABAPENTIN 300 MG PO CAPS
300.0000 mg | ORAL_CAPSULE | Freq: Three times a day (TID) | ORAL | Status: DC
  Administered 2018-03-01 – 2018-03-03 (×7): 300 mg via ORAL
  Filled 2018-03-01 (×7): qty 1

## 2018-03-01 MED ORDER — ALPRAZOLAM 0.5 MG PO TABS: 0.5000 mg | ORAL_TABLET | Freq: Four times a day (QID) | ORAL | 3 refills | Status: DC

## 2018-03-01 MED ORDER — PREDNISONE 10 MG PO TABS
5.0000 mg | ORAL_TABLET | Freq: Two times a day (BID) | ORAL | Status: DC
  Administered 2018-03-01: 08:00:00 5 mg via ORAL
  Filled 2018-03-01: qty 1

## 2018-03-01 MED ORDER — CEFTRIAXONE SODIUM-DEXTROSE 1-3.74 GM-%(50ML) IV SOLR: 1.0000 g | INTRAVENOUS | Status: DC

## 2018-03-01 MED ORDER — ASPIRIN EC 81 MG PO TBEC
81.0000 mg | DELAYED_RELEASE_TABLET | Freq: Every day | ORAL | Status: DC
  Administered 2018-03-01 – 2018-03-02 (×3): 81 mg via ORAL
  Filled 2018-03-01 (×3): qty 1

## 2018-03-01 MED ORDER — PREDNISONE 50 MG PO TABS
50.0000 mg | ORAL_TABLET | Freq: Every day | ORAL | Status: DC
  Administered 2018-03-02 – 2018-03-03 (×2): 50 mg via ORAL
  Filled 2018-03-01: qty 1

## 2018-03-01 MED ORDER — AMLODIPINE BESYLATE 5 MG PO TABS
5.0000 mg | ORAL_TABLET | Freq: Two times a day (BID) | ORAL | Status: DC
  Administered 2018-03-01 – 2018-03-03 (×4): 5 mg via ORAL
  Filled 2018-03-01 (×5): qty 1

## 2018-03-01 MED ORDER — ONDANSETRON HCL 4 MG PO TABS: 4.0000 mg | ORAL_TABLET | Freq: Four times a day (QID) | ORAL | Status: DC | PRN

## 2018-03-01 MED ORDER — PANTOPRAZOLE SODIUM 40 MG IV SOLR
40.0000 mg | Freq: Two times a day (BID) | INTRAVENOUS | Status: DC
  Administered 2018-03-01 – 2018-03-03 (×5): 40 mg via INTRAVENOUS
  Filled 2018-03-01 (×5): qty 40

## 2018-03-01 MED ORDER — IMIPRAMINE HCL 25 MG PO TABS
25.0000 mg | ORAL_TABLET | Freq: Two times a day (BID) | ORAL | Status: DC
  Administered 2018-03-01 – 2018-03-03 (×4): 25 mg via ORAL
  Filled 2018-03-01 (×7): qty 1

## 2018-03-01 MED ORDER — SODIUM CHLORIDE 0.9 % IV SOLN
INTRAVENOUS | Status: DC
  Administered 2018-03-01 – 2018-03-02 (×2): via INTRAVENOUS

## 2018-03-01 MED ORDER — SODIUM CHLORIDE 0.9 % IV SOLN
1.0000 g | INTRAVENOUS | Status: DC
Start: 2018-03-01 — End: 2018-03-03
  Administered 2018-03-01 – 2018-03-02 (×3): 1 g via INTRAVENOUS
  Filled 2018-03-01 (×4): qty 10

## 2018-03-01 MED ORDER — ACETAMINOPHEN 325 MG PO TABS: 650.0000 mg | ORAL_TABLET | Freq: Four times a day (QID) | ORAL | Status: DC | PRN

## 2018-03-01 MED ORDER — SIMETHICONE 80 MG PO CHEW
80.0000 mg | CHEWABLE_TABLET | Freq: Four times a day (QID) | ORAL | Status: DC | PRN
  Administered 2018-03-01: 07:00:00 80 mg via ORAL
  Filled 2018-03-01 (×2): qty 1

## 2018-03-01 MED ORDER — IPRATROPIUM-ALBUTEROL 0.5-2.5 (3) MG/3ML IN SOLN
3.0000 mL | Freq: Once | RESPIRATORY_TRACT | Status: AC
  Administered 2018-03-01: 3 mL via RESPIRATORY_TRACT
  Filled 2018-03-01: qty 3

## 2018-03-01 MED ORDER — ATORVASTATIN CALCIUM 20 MG PO TABS
40.0000 mg | ORAL_TABLET | Freq: Every day | ORAL | Status: DC
  Administered 2018-03-01 – 2018-03-02 (×2): 40 mg via ORAL
  Filled 2018-03-01 (×2): qty 2

## 2018-03-01 MED ORDER — ONDANSETRON HCL 4 MG/2ML IJ SOLN: 4.0000 mg | Freq: Four times a day (QID) | INTRAMUSCULAR | Status: DC | PRN

## 2018-03-01 MED ORDER — LATANOPROST 0.005 % OP SOLN
1.0000 [drp] | Freq: Every day | OPHTHALMIC | Status: DC
  Administered 2018-03-01 – 2018-03-02 (×3): 1 [drp] via OPHTHALMIC
  Filled 2018-03-01: qty 2.5

## 2018-03-01 MED ORDER — POLYETHYLENE GLYCOL 3350 17 G PO PACK: 17.0000 g | PACK | Freq: Every day | ORAL | Status: DC | PRN

## 2018-03-01 MED ORDER — IPRATROPIUM-ALBUTEROL 0.5-2.5 (3) MG/3ML IN SOLN: 3.0000 mL | Freq: Four times a day (QID) | RESPIRATORY_TRACT | Status: DC

## 2018-03-01 MED ORDER — FLUOXETINE HCL 20 MG PO CAPS
20.0000 mg | ORAL_CAPSULE | Freq: Every day | ORAL | Status: DC
  Administered 2018-03-01 – 2018-03-03 (×3): 20 mg via ORAL
  Filled 2018-03-01 (×3): qty 1

## 2018-03-01 MED ORDER — FUROSEMIDE 10 MG/ML IJ SOLN
40.0000 mg | Freq: Once | INTRAMUSCULAR | Status: AC
  Administered 2018-03-01: 40 mg via INTRAVENOUS
  Filled 2018-03-01: qty 4

## 2018-03-01 MED ORDER — ACETAMINOPHEN 650 MG RE SUPP: 650.0000 mg | Freq: Four times a day (QID) | RECTAL | Status: DC | PRN

## 2018-03-01 MED ORDER — ORAL CARE MOUTH RINSE
15.0000 mL | Freq: Two times a day (BID) | OROMUCOSAL | Status: DC
  Administered 2018-03-01 – 2018-03-02 (×4): 15 mL via OROMUCOSAL

## 2018-03-01 MED ORDER — METRONIDAZOLE 500 MG PO TABS
500.0000 mg | ORAL_TABLET | Freq: Three times a day (TID) | ORAL | Status: DC
  Administered 2018-03-01 – 2018-03-03 (×6): 500 mg via ORAL
  Filled 2018-03-01 (×6): qty 1

## 2018-03-01 NOTE — Progress Notes (Signed)
Gave report to Benedetto Goad RN on 1A

## 2018-03-01 NOTE — Progress Notes (Signed)
[facility] [place] NAME: Erin [name]    MR#:  [id number]  DATE OF BIRTH:  [birth year]  SUBJECTIVE:  CHIEF COMPLAINT:   Chief Complaint  Patient presents with  . Abnormal Lab   Feels better. Some SOB  REVIEW OF SYSTEMS:    Review of Systems  Constitutional: Positive for malaise/fatigue. Negative for chills and fever.  HENT: Negative for sore throat.   Eyes: Negative for blurred vision, double vision and pain.  Respiratory: Negative for cough, hemoptysis, shortness of breath and wheezing.   Cardiovascular: Negative for chest pain, palpitations, orthopnea and leg swelling.  Gastrointestinal: Negative for abdominal pain, constipation, diarrhea, heartburn, nausea and vomiting.  Genitourinary: Negative for dysuria and hematuria.  Musculoskeletal: Positive for joint pain. Negative for back pain.  Skin: Negative for rash.  Neurological: Negative for sensory change, speech change, focal weakness and headaches.  Endo/Heme/Allergies: Does not bruise/bleed easily.  Psychiatric/Behavioral: Negative for depression. The patient is not nervous/anxious.     DRUG ALLERGIES:   Allergies  Allergen Reactions  . Remicade [Infliximab] Shortness Of Breath and Itching  . Sulfa Antibiotics Other (See Comments)    Other Reaction: tounge cracked  . Actonel [Risedronate Sodium] Other (See Comments)  . Fosamax [Alendronate Sodium]     GI Bleed   . Procardia [Nifedipine] Hives  . Sulfur Swelling  . Wellbutrin [Bupropion] Anxiety    VITALS:  Blood pressure 130/63, pulse 80, temperature 98.1 F (36.7 C), temperature source Oral, resp. rate 16, height 5\' 2"  (1.575 m), weight 100.4 kg (221 lb 4.8 oz), SpO2 96 %.  PHYSICAL EXAMINATION:   Physical Exam  GENERAL:  72 y.o.-year-old patient lying in the bed with no acute distress.  EYES: Pupils equal, round, reactive to light and accommodation. No scleral icterus. Extraocular muscles intact.  HEENT: Head  atraumatic, normocephalic. Oropharynx and nasopharynx clear.  NECK:  Supple, no jugular venous distention. No thyroid enlargement, no tenderness.  LUNGS: Normal breath sounds bilaterally, no wheezing, rales, rhonchi. No use of accessory muscles of respiration.  CARDIOVASCULAR: S1, S2 normal. No murmurs, rubs, or gallops.  ABDOMEN: Soft, nontender, nondistended. Bowel sounds present. No organomegaly or mass.  EXTREMITIES: No cyanosis, clubbing or edema b/l.    Left hip surgical wound with dressing NEUROLOGIC: Cranial nerves II through XII are intact. No focal Motor or sensory deficits b/l.   PSYCHIATRIC: The patient is alert and oriented x 3.  SKIN: No obvious rash, lesion, or ulcer.   LABORATORY PANEL:   CBC Recent Labs  Lab 03/01/18 0830  WBC 20.8*  HGB 10.0*  HCT 30.5*  PLT 343   ------------------------------------------------------------------------------------------------------------------ Chemistries  Recent Labs  Lab 03/01/18 0830  NA 135  K 5.0  CL 101  CO2 25  GLUCOSE 82  BUN 33*  CREATININE 0.75  CALCIUM 8.4*  AST 66*  ALT 95*  ALKPHOS 126  BILITOT 0.7   ------------------------------------------------------------------------------------------------------------------  Cardiac Enzymes Recent Labs  Lab 02/22/18 1552  TROPONINI <0.03   ------------------------------------------------------------------------------------------------------------------  RADIOLOGY:  No results found.   ASSESSMENT AND PLAN:   *  Symptomatic anemia Acute on chronic anemia GI loss. But also had recent left hip surgery likely contributing S/P 2 Unit PRBC transfusion EGD tomorrow  *  Idiopathic interstitial fibrosis of lung syndrome continue home meds   * Scleroderma continue home medications including immunosuppressant  *  [name] (coronary artery disease), native coronary artery -continue home meds  *  Hyperlipidemia -home dose antilipid  All the records  are reviewed  and case discussed with Care Management/Social Worker Management plans discussed with the patient, family and they are in agreement.  CODE STATUS: FULL CODE  DVT Prophylaxis: SCDs  TOTAL TIME TAKING CARE OF THIS PATIENT: 35 minutes.   POSSIBLE D/C IN 2-3 DAYS, DEPENDING ON CLINICAL CONDITION.  Erin [name] M.D on 03/01/2018 at 3:04 PM  Between 7am to 6pm - Pager - [phone]  After 6pm go to [email or web address] - password [email or web address] [facility]  Office  [phone]  CC: Primary care physician; Idelle Crouch, MD  Note: This dictation was prepared with Dragon dictation along with smaller phrase technology. Any transcriptional errors that result from this process are unintentional.

## 2018-03-01 NOTE — NC FL2 (Signed)
[place] LEVEL OF CARE SCREENING TOOL     IDENTIFICATION  Patient Name: Erin [name] Birthdate: 04/01/1946 Sex: female Admission Date (Current Location): 02/28/2018  [place] and [place] Number:  Engineering geologist and Address:  [facility], [address], [place], [place] [address]      Provider Number: [id number]  Attending Physician Name and Address:  Hillary Bow, MD  Relative Name and Phone Number:       Current Level of Care: Hospital Recommended Level of Care: [place] Prior Approval Number:    Date Approved/Denied:   PASRR Number: ([id number] )  Discharge Plan: SNF    Current Diagnoses: Patient Active Problem List   Diagnosis Date Noted  . Symptomatic anemia 02/28/2018  . S/P hip hemiarthroplasty 02/26/2018  . Hip fracture ([place]) 02/22/2018  . Hyperlipidemia 10/26/2017  . [name] (coronary artery disease), native coronary artery 10/24/2017  . Vulvar irritation 09/02/2017  . 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 Screening for cardiovascular condition 12/15/2016  . Shortness of breath 12/15/2016  . Idiopathic interstitial fibrosis of lung syndrome ([place]) 12/15/2016  . Scleroderma ([place]) 12/15/2016  . Raynaud's disease without gangrene 12/15/2016  . Melanoma of skin ([place]) 11/11/2016  . Lesion of labia 11/11/2016    Orientation RESPIRATION BLADDER Height & Weight     Self, Time, Situation, Place  Normal Incontinent Weight: 221 lb 4.8 oz (100.4 kg) Height:  5\' 2"  (157.5 cm)  BEHAVIORAL SYMPTOMS/MOOD NEUROLOGICAL BOWEL NUTRITION STATUS      Continent Diet(Diet: Clear Liquid to be Advanced. )  AMBULATORY STATUS COMMUNICATION OF NEEDS Skin   Extensive Assist Verbally Surgical wounds(02/23/18: Incision: Left Hip. )                       Personal Care Assistance Level of Assistance  Bathing, Feeding, Dressing  Bathing Assistance: Limited assistance Feeding assistance: Independent Dressing Assistance: Limited assistance     Functional Limitations Info  Sight, Hearing, Speech Sight Info: Adequate Hearing Info: Adequate Speech Info: Adequate    SPECIAL CARE FACTORS FREQUENCY  PT (By licensed PT), OT (By licensed OT)     PT Frequency: (5) OT Frequency: (5)            Contractures      Additional Factors Info  Code Status, Allergies Code Status Info: (Full Code. ) Allergies Info: (Remicade Infliximab, Sulfa Antibiotics, Actonel Risedronate Sodium, Fosamax Alendronate Sodium, Procardia Nifedipine, Sulfur, Wellbutrin Bupropion)           Current Medications (03/01/2018):  This is the current hospital active medication list Current Facility-Administered Medications  Medication Dose Route Frequency Provider Last Rate Last Dose  . acetaminophen (TYLENOL) tablet 650 mg  650 mg Oral Q6H PRN Lance Coon, MD       Or  . acetaminophen (TYLENOL) suppository 650 mg  650 mg Rectal Q6H PRN Lance Coon, MD      . ALPRAZolam Duanne Moron) tablet 0.5 mg  0.5 mg Oral Q6H Lance Coon, MD   0.5 mg at 03/01/18 1214  . amLODipine (NORVASC) tablet 5 mg  5 mg Oral BID Lance Coon, MD   5 mg at 03/01/18 0809  . aspirin EC tablet 81 mg  81 mg Oral Corwin Levins, MD   81 mg at 03/01/18 0100  . atorvastatin (LIPITOR) tablet 40 mg  40 mg Oral q1800 Lance Coon, MD      .  cefTRIAXone (ROCEPHIN) 1 g in sodium chloride 0.9 % 100 mL IVPB  1 g Intravenous Q24H Lance Coon, MD   Stopped at 03/01/18 0131  . FLUoxetine (PROZAC) capsule 20 mg  20 mg Oral Daily Lance Coon, MD   20 mg at 03/01/18 0809  . furosemide (LASIX) injection 40 mg  40 mg Intravenous Once Sudini, Alveta Heimlich, MD      . gabapentin (NEURONTIN) capsule 300 mg  300 mg Oral TID Lance Coon, MD   300 mg at 03/01/18 1610  . hydroxychloroquine (PLAQUENIL) tablet 200 mg  200 mg Oral BID Lance Coon, MD   200 mg at 03/01/18 0810  . imipramine  (TOFRANIL) tablet 25 mg  25 mg Oral BID Lance Coon, MD      . ipratropium-albuterol (DUONEB) 0.5-2.5 (3) MG/3ML nebulizer solution 3 mL  3 mL Nebulization Once Sudini, Srikar, MD      . latanoprost (XALATAN) 0.005 % ophthalmic solution 1 drop  1 drop Both Eyes QHS Lance Coon, MD   1 drop at 03/01/18 0101  . MEDLINE mouth rinse  15 mL Mouth Rinse BID Lance Coon, MD   15 mL at 03/01/18 0811  . metroNIDAZOLE (FLAGYL) tablet 500 mg  500 mg Oral TID Lance Coon, MD   500 mg at 03/01/18 0809  . ondansetron (ZOFRAN) tablet 4 mg  4 mg Oral Q6H PRN Lance Coon, MD       Or  . ondansetron [facility]) injection 4 mg  4 mg Intravenous Q6H PRN Lance Coon, MD      . oxyCODONE (Oxy IR/ROXICODONE) immediate release tablet 5 mg  5 mg Oral Q4H PRN Lance Coon, MD      . pantoprazole (PROTONIX) injection 40 mg  40 mg Intravenous Q12H Vonda Antigua B, MD   40 mg at 03/01/18 1330  . polyethylene glycol (MIRALAX / GLYCOLAX) packet 17 g  17 g Oral Daily PRN Harrie Foreman, MD      . predniSONE (DELTASONE) tablet 50 mg  50 mg Oral Q breakfast Sudini, Srikar, MD      . simethicone [facility]) chewable tablet 80 mg  80 mg Oral Q6H PRN Harrie Foreman, MD   80 mg at 03/01/18 9604     Discharge Medications: Please see discharge summary for a list of discharge medications.  Relevant Imaging Results:  Relevant Lab Results:   Additional Information (SSN: [id number])  Candela Krul, [place] Beets, LCSW

## 2018-03-01 NOTE — Progress Notes (Signed)
Pt arrived to room 141 from 1C. Pt weaned to room air. Pulse ox gotten from pts ear due to hx of raynaud's disease. IV saline locked. Pt NPO at this time. No complaints from pt at this time. Call bell and phone within reach. Family at bedside.

## 2018-03-01 NOTE — Care Management Obs Status (Signed)
[place] NOTIFICATION   Patient Details  Name: Erin [name] MRN: [id number] Date of Birth: Sep 22, 1946   Medicare Observation Status Notification Given:  Yes    Shelbie Ammons, RN 03/01/2018, 8:58 AM

## 2018-03-01 NOTE — Plan of Care (Signed)
Received a newly admitted 72 y.o female caucasian from the ED, wheeled to the unit per stretcher escorted by ED Tech. Patient alert and oriented x 4, able to make needs known. Orientation to the unit provided. Patient admitted for Symptomatic Anemia. Blood transfusion of 2 units ordered, 2nd unit currently transfusing. Rest and comforts maintained. Not in any form of distress. Will continue to monitor.

## 2018-03-01 NOTE — Telephone Encounter (Signed)
Rx sent to [facility] phone : [phone] , fax : [phone]  

## 2018-03-01 NOTE — Consult Note (Addendum)
Erin Antigua, MD [address] [place], [place], [place], [address] [address], [place], [place], [place], [address] Phone: [phone]  Fax: [phone]  Consultation  Referring Provider:     Dr. Darvin Neighbours Primary Care Physician:  Idelle Crouch, MD Primary Gastroenterologist: Dr. Gustavo Lah Reason for Consultation:     Anemia  Date of Admission:  02/28/2018 Date of Consultation:  03/01/2018         HPI:   Erin [name] is a 72 y.o. female who underwent Left arthroplasty on March 19th 2019 and has been in a facility since discharge. She was found to be anemic and was sent to the hospital.  She is on prednisone chronically for her scleroderma and rheumatoid arthritis.  She is also on iron tablets.  Patient denies any active GI bleeding.  No vomiting.  Denies any blood in her stool.  Hemoccult was positive in the ER.  Patient denies any abdominal pain.  Denies any other sites of bleeding, no epistaxis, no blood in urine.  Progress note by nurse practitioner at the nursing home stated " Incision red with ++ serosanguineous drainage.   "  Patient is followed by [facility], and was recently seen by them on March 14.  Her last EGD and colonoscopy were by them in February 2018. As per their notes:  "01/20/17 had EGD and colonoscopy EGD: Esophageal polyp at the GE junction that intermittently obstructed the GEJ, minimal gastritis, candidal esophagus treated with diflucan. There was some possible Barretts but no h pylori, dysplasia, or malignancy. Recommended to repeat egd in a year. Colon: diverticulosis 3 adenomatous polyps. Due in 81yr. Shortly afterwards she had an Upper EUS with Dr Mont Dutton for the gastroesophageal polyp- this appears to have been completely removed and was a hyperplastic polyp without any dysplasia or malignancy. There was still a little candida in her esophagus, but this had improved. It was also noted at that time that the ultrasound portion of the  exam was held at that time due to concerns for aspiration, but that no further follow up was needed if this was a benign inflammatory polyp. Path returned as benign hyperplastic polyp with inflammation/features of early torsion. There was no dysplasia or malignancy."   Past Medical History:  Diagnosis Date  . Anemia    h/o  . Cancer ([place])    melanoma x 2 the last one was of her leg and surgeon removed it  . Colon polyp   . Coronary artery calcification   . Coronary artery disease   . DDD (degenerative disc disease), lumbar   . Diverticulosis 01/20/2017  . Dysphagia   . Dyspnea    with exertion-unable to walk a mile without getting sob- dr sparks set pt up to see Dr Rockey Situ after 11-19-16 surgery  . Environmental allergies   . Esophageal dysmotility   . Fibrocystic breast disease   . GERD (gastroesophageal reflux disease)   . Heart murmur    h/o   . Hyperlipidemia    unspecified  . Hypertension   . Mitral regurgitation   . Monilial esophagitis ([place]) 01/20/2017  . Obstipation   . Osteopenia   . Peptic ulcer disease   . Pulmonary fibrosis ([place])    per Dr Raul Del  . RA (rheumatoid arthritis) ([place])   . Raynaud's disease   . Redundant colon 01/20/2017  . Scleroderma ([place])   . Telangiectasia of colon   . Tubular adenoma of colon 01/20/2017   unspecified  . Vulvar  dysplasia     Past Surgical History:  Procedure Laterality Date  . ABDOMINAL HYSTERECTOMY    . BREAST CYST EXCISION Left 20+ years ago   No scar visible  . BREAST LUMPECTOMY Right   . CARPAL TUNNEL RELEASE Bilateral   . COLON SURGERY     colon polyp   . COLONOSCOPY  09/30/2011   tubular adenoma rtm  . COLONOSCOPY     05/02/2003, 04/10/2000  . COLONOSCOPY WITH PROPOFOL N/A 01/20/2017   Procedure: COLONOSCOPY WITH PROPOFOL;  Surgeon: Lollie Sails, MD;  Location: [facility] ENDOSCOPY;  Service: Endoscopy;  Laterality: N/A;  . ESOPHAGOGASTRODUODENOSCOPY    . ESOPHAGOGASTRODUODENOSCOPY     09/30/2011, 04/10/2000 , no  repeat rtm  . ESOPHAGOGASTRODUODENOSCOPY (EGD) WITH PROPOFOL N/A 01/20/2017   Procedure: ESOPHAGOGASTRODUODENOSCOPY (EGD) WITH PROPOFOL;  Surgeon: Lollie Sails, MD;  Location: [facility] ENDOSCOPY;  Service: Endoscopy;  Laterality: N/A;  . EXCISION HYDRADENITIS LABIA Left 11/19/2016   Procedure: EXCISION LABIAL MASS;  Surgeon: Robert Bellow, MD;  Location: [facility] ORS;  Service: General;  Laterality: Left;  . EYE SURGERY Bilateral    cataracts  . HEEL SPUR EXCISION    . HIP ARTHROPLASTY Left 02/23/2018   Procedure: ARTHROPLASTY BIPOLAR HIP (HEMIARTHROPLASTY);  Surgeon: Thornton Park, MD;  Location: [facility] ORS;  Service: Orthopedics;  Laterality: Left;  Marland Kitchen MASS EXCISION Left 11/19/2016   Procedure: EXCISION LEFT THIGH MELANOMA;  Surgeon: Robert Bellow, MD;  Location: [facility] ORS;  Service: General;  Laterality: Left;  . SENTINEL NODE BIOPSY Left 11/19/2016   Procedure: INGUINAL SENTINEL NODE BIOPSY;  Surgeon: Robert Bellow, MD;  Location: [facility] ORS;  Service: General;  Laterality: Left;  . UPPER ESOPHAGEAL ENDOSCOPIC ULTRASOUND (EUS) N/A 01/29/2017   Procedure: UPPER ESOPHAGEAL ENDOSCOPIC ULTRASOUND (EUS);  Surgeon: Holly Bodily, MD;  Location: [facility] ENDOSCOPY;  Service: Endoscopy;  Laterality: N/A;    Prior to Admission medications   Medication Sig Start Date End Date Taking? Authorizing Provider  ALPRAZolam Duanne Moron) 0.5 MG tablet Take 0.5 mg by mouth every 6 (six) hours.    Yes [provider]  amLODipine (NORVASC) 10 MG tablet Take 5 mg by mouth 2 (two) times daily.    Yes [provider]  aspirin EC 81 MG tablet Take 81 mg by mouth at bedtime.   Yes [provider]  cefTRIAXone (ROCEPHIN) 1-3.74 GM-%(50ML) IVPB Inject 1 g into the vein daily. 02/27/18 03/05/18 Yes [provider]  cetirizine (ZYRTEC) 10 MG tablet Take 10 mg by mouth daily as needed for allergies.    Yes [provider]  enoxaparin (LOVENOX) 40 MG/0.4ML injection Inject 0.4  mLs (40 mg total) into the skin daily. 02/26/18  Yes Fritzi Mandes, MD  ezetimibe (ZETIA) 10 MG tablet TAKE ONE TABLET BY MOUTH EVERY DAY 11/16/17  Yes Gollan, Kathlene November, MD  ferrous sulfate 325 (65 FE) MG tablet Take 1 tablet (325 mg total) by mouth 3 (three) times daily after meals. 02/25/18  Yes Fritzi Mandes, MD  FLUoxetine (PROZAC) 20 MG capsule Take 20 mg by mouth daily.    Yes [provider]  furosemide (LASIX) 20 MG tablet Take 20mg s daily as needed for swelling 08/04/16  Yes [provider]  gabapentin (NEURONTIN) 300 MG capsule Take 300 mg by mouth 3 (three) times daily.  03/16/17 03/16/18 Yes [provider]  guaifenesin (ROBITUSSIN) 100 MG/5ML syrup Take 200 mg by mouth every 4 (four) hours as needed for cough.   Yes [provider]  hydroxychloroquine (  PLAQUENIL) 200 MG tablet Take 200 mg by mouth 2 (two) times daily.  03/16/17  Yes [provider]  imipramine (TOFRANIL) 25 MG tablet Take 25 mg by mouth 2 (two) times daily.   Yes [provider]  ipratropium-albuterol (DUONEB) 0.5-2.5 (3) MG/3ML SOLN Take 3 mLs by nebulization every 4 (four) hours as needed (wheezing, dyspnea).   Yes [provider]  latanoprost (XALATAN) 0.005 % ophthalmic solution Place 1 drop into both eyes at bedtime.    Yes [provider]  metroNIDAZOLE (FLAGYL) 500 MG tablet Take 500 mg by mouth 3 (three) times daily. 02/26/18 03/05/18 Yes [provider]  Multiple Vitamin (MULTI-VITAMINS) TABS Take 1 tablet by mouth daily.    Yes [provider]  Omega-3 Fatty Acids (FISH OIL) 1000 MG CAPS Take 1 capsule by mouth 2 (two) times daily.    Yes [provider]  oxyCODONE (OXY IR/ROXICODONE) 5 MG immediate release tablet Take 1 tablet (5 mg total) by mouth every 4 (four) hours as needed for moderate pain (pain score 4-6). 02/25/18  Yes Toni Arthurs, NP  polyethylene glycol (MIRALAX / GLYCOLAX) packet Take 17 g by mouth daily.    Yes  [provider]  predniSONE (DELTASONE) 20 MG tablet Take 40 mg by mouth daily with breakfast.   Yes [provider]  predniSONE (DELTASONE) 5 MG tablet Take 5 mg by mouth 2 (two) times daily with a meal.    Yes [provider]  simvastatin (ZOCOR) 80 MG tablet Take 80mg s orally at bedtime 05/22/16  Yes [provider]  sodium chloride 0.9 % infusion Inject 65 mLs into the vein continuous. 02/26/18 03/01/18 Yes [provider]  sorbitol 70 % solution Take 30 mLs by mouth every 2 (two) hours as needed (constipation).   Yes [provider]    Family History  Problem Relation Age of Onset  . Endometrial cancer Mother 23  . Hypertension Mother   . Osteoporosis Mother   . Bladder Cancer Father   . COPD Father   . Cerebral palsy Sister      Social History   Tobacco Use  . Smoking status: Never Smoker  . Smokeless tobacco: Never Used  Substance Use Topics  . Alcohol use: No  . Drug use: No    Allergies as of 02/28/2018 - Review Complete 02/28/2018  Allergen Reaction Noted  . Remicade [infliximab] Shortness Of Breath and Itching 04/10/2015  . Sulfa antibiotics Other (See Comments)   . Actonel [risedronate sodium] Other (See Comments) 01/19/2017  . Fosamax [alendronate sodium]  11/10/2016  . Procardia [nifedipine] Hives 11/10/2016  . Sulfur Swelling 04/10/2015  . Wellbutrin [bupropion] Anxiety 11/10/2016    Review of Systems:    All systems reviewed and negative except where noted in HPI.   Physical Exam:  Vital signs in last 24 hours: Vitals:   03/01/18 0405 03/01/18 0638 03/01/18 0753 03/01/18 1204  BP: 138/64 137/77 140/64 130/63  Pulse: 78 75 70 80  Resp: 18 18 16 16   Temp: 98.4 F (36.9 C) 98.3 F (36.8 C) 98.6 F (37 C) 98.1 F (36.7 C)  TempSrc: Oral Oral Oral Oral  SpO2: 99% 99% 100% 96%  Weight:      Height:       Last BM Date: 02/25/18 General:   Pleasant, cooperative in NAD Head:  Normocephalic and  atraumatic. Eyes:   No icterus.   Conjunctiva pink. PERRLA. Ears:  Normal auditory acuity. Neck:  Supple; no masses or  thyroidomegaly Lungs: Respirations even and unlabored.  Abdomen:  Soft, nondistended, nontender. Normal bowel sounds. No appreciable masses or hepatomegaly.  No rebound or guarding.  Rectal: Brown stool Neurologic:  Alert and oriented x3;  grossly normal neurologically. Skin:  Intact without significant lesions or rashes. Cervical Nodes:  No significant cervical adenopathy. Psych:  Alert and cooperative. Normal affect.  LAB RESULTS: Recent Labs    02/28/18 0455 02/28/18 2031 03/01/18 0830  WBC 16.9* 18.5* 20.8*  HGB 6.0* 6.4* 10.0*  HCT 19.2* 20.2* 30.5*  PLT 279 345 343   BMET Recent Labs    02/28/18 0455 02/28/18 2031 03/01/18 0830  NA 132* 134* 135  K 4.9 5.0 5.0  CL 101 101 101  CO2 23 22 25   GLUCOSE 130* 158* 82  BUN 38* 38* 33*  CREATININE 1.01* 0.82 0.75  CALCIUM 8.0* 8.0* 8.4*   LFT Recent Labs    03/01/18 0830  PROT 6.1*  ALBUMIN 2.5*  AST 66*  ALT 95*  ALKPHOS 126  BILITOT 0.7  BILIDIR <0.1*  IBILI NOT CALCULATED   PT/INR No results for input(s): LABPROT, INR in the last 72 hours.  STUDIES: No results found.    Impression / Plan:   Erin [name] is a 72 y.o. y/o female with recent left hip arthroplasty sent from nursing home facility due to anemia, with hemoglobin around 6, now 10 status post 2 units PRBC transfusion with no anticoagulants at home but on aspirin and prednisone, with recent CT scan showing markedly dilated esophagus, and dilated thoracic esophagus with decreased peristalsis noted on esophagram in 2017  Patient does have risk factors of developing peptic ulcer disease with her NSAID use (aspirin) and prednisone Hemoglobin 1 day after her surgery was 8.6, but has slowly been decreasing since then Her rectal exam today did not show any bright red blood or melena, and no sacral decub ulcers EGD is indicated to  rule out peptic ulcer disease or esophagitis as cause of ongoing anemia, but no signs of active GI bleeding so other sources need to evaluated as well.  Protonix IV BID. Pt. Has not received any of these doses. I ordered it at this time Continue Serial CBCs and transfuse PRN Hold Aspirin if possible  Will plan on EGD after medical optimization in 1-2 days (pt. coughing a lot today- Primary team to please address) Primary team to please also evaluate left hip dressing site to ensure that is not the cause of her anemia. Consider imaging of that site to rule out hematoma  However, would strongly recommend primary team to evaluate arthroplasty site for any signs of bleeding or infection at the site. Unsure when the last time someone wound looked at it was.  However, progress note by nurse practitioner at the nursing home stated " Incision red with ++ serosanguineous drainage.   " Please have this evaluated by Ortho or wound care.  Currently she has an adherent dressing in place at left hip  I have asked Dr. Mack Guise if patient can be safely related to her left side for an upper endoscopy.  And he has given his clearance for this.  Thank you for involving me in the care of this patient.      LOS: 0 days   Virgel Manifold, MD  03/01/2018, 12:26 PM

## 2018-03-01 NOTE — Care Management Note (Signed)
Case Management Note  Patient Details  Name: Erin [name] MRN: [id number] Date of Birth: August 15, 1946  Subjective/Objective: Admitted to [facility] under observation status with the diagnosis of anemia. Usually lives alone. [place] last Wednesday and was transferred to [facility] for skilled care. Relative is Ortencia Kick [phone]). Sees Dr. Ouida Sills as primary care physician. Prescriptions are filled at [facility]. No home oxygen. Cane and rolling walker in the home, if needed. Takes care of all basic activities of daily living herself, drives.                    Action/Plan: Would like to return to [facility], if possible   Expected Discharge Date:                  Expected Discharge Plan:     In-House Referral:     Discharge planning Services     Post Acute Care Choice:    Choice offered to:     DME Arranged:    DME Agency:     HH Arranged:    HH Agency:     Status of Service:     If discussed at [employer], dates discussed:    Additional Comments:  Shelbie Ammons, RN MSN CCM Care Management [phone] 03/01/2018, 8:58 AM

## 2018-03-01 NOTE — Clinical Social Work Note (Signed)
Clinical Social Work Assessment  Patient Details  Name: Erin [name] MRN: [id number] Date of Birth: 05/15/1946  Date of referral:  03/01/18               Reason for consult:  Other (Comment Required)(From [employer] )                Permission sought to share information with:  Chartered certified accountant granted to share information::  Yes, Verbal Permission Granted  Name::      Materials engineer SNF STR  Agency::     Relationship::     Contact Information:     Housing/Transportation Living arrangements for the past 2 months:  [place] of Information:  Patient Patient Interpreter Needed:  None Criminal Activity/Legal Involvement Pertinent to Current Situation/Hospitalization:  No - Comment as needed Significant Relationships:  Adult Children Lives with:  Self Do you feel safe going back to the place where you live?  Yes Need for family participation in patient care:  No (Coment)  Care giving concerns:  Patient came to [facility] from [facility], where she has been for short term rehab.    Social Worker assessment / plan:  Holiday representative (CSW) reviewed chart and noted that patient is from [employer] Per [facility] admissions coordinator at [facility] patient can return to short term rehab however it will be a semi-private room. CSW met with patient and made her aware of above. Patient is agreeable to return to [facility] and continue her rehab in a semi- private room. Patient reported that she lives alone in [place] and hopes to get home in a few weeks after rehab. FL2 complete. CSW will continue to follow and assist as needed.   Employment status:  Retired Forensic scientist:  Medicare PT Recommendations:  [place] / Referral to community resources:  [place]  Patient/Family's Response to care:  Patient is agreeable to return to [employer].   Patient/Family's Understanding of and  Emotional Response to Diagnosis, Current Treatment, and Prognosis:  Patient was very pleasant and thanked CSW for assistance.   Emotional Assessment Appearance:  Appears stated age Attitude/Demeanor/Rapport:    Affect (typically observed):  Accepting, Adaptable, Pleasant Orientation:  Oriented to Self, Oriented to Place, Oriented to  Time, Oriented to Situation Alcohol / Substance use:  Not Applicable Psych involvement (Current and /or in the community):  No (Comment)  Discharge Needs  Concerns to be addressed:  Discharge Planning Concerns Readmission within the last 30 days:  No Current discharge risk:  Dependent with Mobility Barriers to Discharge:  Continued Medical Work up   [employer], Veronia Beets, LCSW 03/01/2018, 3:29 PM

## 2018-03-01 NOTE — Consult Note (Signed)
ORTHOPAEDIC CONSULTATION  REQUESTING PHYSICIAN: Hillary Bow, MD  Chief Complaint: Anemia following recent hip surgery  HPI: Erin [name] is a 72 y.o. female who returns to [facility] from [place] with anemia.  Patient received 2 units of PRBC.  Patient scheduled for endoscopy tomorrow.  Patient denies any left hip pain at rest.  She hasn't been able to do much PT at [facility].     Past Medical History:  Diagnosis Date  . Anemia    h/o  . Cancer ([place])    melanoma x 2 the last one was of her leg and surgeon removed it  . Colon polyp   . Coronary artery calcification   . Coronary artery disease   . DDD (degenerative disc disease), lumbar   . Diverticulosis 01/20/2017  . Dysphagia   . Dyspnea    with exertion-unable to walk a mile without getting sob- dr sparks set pt up to see Dr Rockey Situ after 11-19-16 surgery  . Environmental allergies   . Esophageal dysmotility   . Fibrocystic breast disease   . GERD (gastroesophageal reflux disease)   . Heart murmur    h/o   . Hyperlipidemia    unspecified  . Hypertension   . Mitral regurgitation   . Monilial esophagitis ([place]) 01/20/2017  . Obstipation   . Osteopenia   . Peptic ulcer disease   . Pulmonary fibrosis ([place])    per Dr Raul Del  . RA (rheumatoid arthritis) ([place])   . Raynaud's disease   . Redundant colon 01/20/2017  . Scleroderma ([place])   . Telangiectasia of colon   . Tubular adenoma of colon 01/20/2017   unspecified  . Vulvar dysplasia    Past Surgical History:  Procedure Laterality Date  . ABDOMINAL HYSTERECTOMY    . BREAST CYST EXCISION Left 20+ years ago   No scar visible  . BREAST LUMPECTOMY Right   . CARPAL TUNNEL RELEASE Bilateral   . COLON SURGERY     colon polyp   . COLONOSCOPY  09/30/2011   tubular adenoma rtm  . COLONOSCOPY     05/02/2003, 04/10/2000  . COLONOSCOPY WITH PROPOFOL N/A 01/20/2017   Procedure: COLONOSCOPY WITH PROPOFOL;  Surgeon: Lollie Sails, MD;  Location[facility] ENDOSCOPY;  Service:  Endoscopy;  Laterality: N/A;  . ESOPHAGOGASTRODUODENOSCOPY    . ESOPHAGOGASTRODUODENOSCOPY     09/30/2011, 04/10/2000 , no repeat rtm  . ESOPHAGOGASTRODUODENOSCOPY (EGD) WITH PROPOFOL N/A 01/20/2017   Procedure: ESOPHAGOGASTRODUODENOSCOPY (EGD) WITH PROPOFOL;  Surgeon: Lollie Sails, MD;  Location: [facility] ENDOSCOPY;  Service: Endoscopy;  Laterality: N/A;  . EXCISION HYDRADENITIS LABIA Left 11/19/2016   Procedure: EXCISION LABIAL MASS;  Surgeon: Robert Bellow, MD;  Location: [facility] ORS;  Service: General;  Laterality: Left;  . EYE SURGERY Bilateral    cataracts  . HEEL SPUR EXCISION    . HIP ARTHROPLASTY Left 02/23/2018   Procedure: ARTHROPLASTY BIPOLAR HIP (HEMIARTHROPLASTY);  Surgeon: Thornton Park, MD;  Location: [facility] ORS;  Service: Orthopedics;  Laterality: Left;  Marland Kitchen MASS EXCISION Left 11/19/2016   Procedure: EXCISION LEFT THIGH MELANOMA;  Surgeon: Robert Bellow, MD;  Location: [facility] ORS;  Service: General;  Laterality: Left;  . SENTINEL NODE BIOPSY Left 11/19/2016   Procedure: INGUINAL SENTINEL NODE BIOPSY;  Surgeon: Robert Bellow, MD;  Location: [facility] ORS;  Service: General;  Laterality: Left;  . UPPER ESOPHAGEAL ENDOSCOPIC ULTRASOUND (EUS) N/A 01/29/2017   Procedure: UPPER ESOPHAGEAL ENDOSCOPIC ULTRASOUND (EUS);  Surgeon: Holly Bodily, MD;  Location: [facility] ENDOSCOPY;  Service: Endoscopy;  Laterality: N/A;   Social History   Socioeconomic History  . Marital status: Widowed    Spouse name: Not on file  . Number of children: Not on file  . Years of education: Not on file  . Highest education level: Not on file  Occupational History  . Occupation: retired  Scientific laboratory technician  . Financial resource strain: Not on file  . Food insecurity:    Worry: Not on file    Inability: Not on file  . Transportation needs:    Medical: Not on file    Non-medical: Not on file  Tobacco Use  . Smoking status: Never Smoker  . Smokeless tobacco: Never Used  Substance and Sexual Activity   . Alcohol use: No  . Drug use: No  . Sexual activity: Not on file  Lifestyle  . Physical activity:    Days per week: Not on file    Minutes per session: Not on file  . Stress: Not on file  Relationships  . Social connections:    Talks on phone: Not on file    Gets together: Not on file    Attends religious service: Not on file    Active member of club or organization: Not on file    Attends meetings of clubs or organizations: Not on file    Relationship status: Not on file  Other Topics Concern  . Not on file  Social History Narrative  . Not on file   Family History  Problem Relation Age of Onset  . Endometrial cancer Mother 12  . Hypertension Mother   . Osteoporosis Mother   . Bladder Cancer Father   . COPD Father   . Cerebral palsy Sister    Allergies  Allergen Reactions  . Remicade [Infliximab] Shortness Of Breath and Itching  . Sulfa Antibiotics Other (See Comments)    Other Reaction: tounge cracked  . Actonel [Risedronate Sodium] Other (See Comments)  . Fosamax [Alendronate Sodium]     GI Bleed   . Procardia [Nifedipine] Hives  . Sulfur Swelling  . Wellbutrin [Bupropion] Anxiety   Prior to Admission medications   Medication Sig Start Date End Date Taking? Authorizing Provider  amLODipine (NORVASC) 10 MG tablet Take 5 mg by mouth 2 (two) times daily.    Yes [provider]  aspirin EC 81 MG tablet Take 81 mg by mouth at bedtime.   Yes [provider]  cefTRIAXone (ROCEPHIN) 1-3.74 GM-%(50ML) IVPB Inject 1 g into the vein daily. 02/27/18 03/05/18 Yes [provider]  cetirizine (ZYRTEC) 10 MG tablet Take 10 mg by mouth daily as needed for allergies.    Yes [provider]  enoxaparin (LOVENOX) 40 MG/0.4ML injection Inject 0.4 mLs (40 mg total) into the skin daily. 02/26/18  Yes Fritzi Mandes, MD  ezetimibe (ZETIA) 10 MG tablet TAKE ONE TABLET BY MOUTH EVERY DAY 11/16/17  Yes Gollan, Kathlene November, MD  ferrous sulfate 325 (65 FE) MG  tablet Take 1 tablet (325 mg total) by mouth 3 (three) times daily after meals. 02/25/18  Yes Fritzi Mandes, MD  FLUoxetine (PROZAC) 20 MG capsule Take 20 mg by mouth daily.    Yes [provider]  furosemide (LASIX) 20 MG tablet Take 20mg s daily as needed for swelling 08/04/16  Yes [provider]  gabapentin (NEURONTIN) 300 MG capsule Take 300 mg by mouth 3 (three) times daily.  03/16/17 03/16/18 Yes [provider]  guaifenesin (ROBITUSSIN) 100 MG/5ML syrup Take 200 mg by mouth every  4 (four) hours as needed for cough.   Yes [provider]  hydroxychloroquine (PLAQUENIL) 200 MG tablet Take 200 mg by mouth 2 (two) times daily.  03/16/17  Yes [provider]  imipramine (TOFRANIL) 25 MG tablet Take 25 mg by mouth 2 (two) times daily.   Yes [provider]  ipratropium-albuterol (DUONEB) 0.5-2.5 (3) MG/3ML SOLN Take 3 mLs by nebulization every 4 (four) hours as needed (wheezing, dyspnea).   Yes [provider]  latanoprost (XALATAN) 0.005 % ophthalmic solution Place 1 drop into both eyes at bedtime.    Yes [provider]  metroNIDAZOLE (FLAGYL) 500 MG tablet Take 500 mg by mouth 3 (three) times daily. 02/26/18 03/05/18 Yes [provider]  Multiple Vitamin (MULTI-VITAMINS) TABS Take 1 tablet by mouth daily.    Yes [provider]  Omega-3 Fatty Acids (FISH OIL) 1000 MG CAPS Take 1 capsule by mouth 2 (two) times daily.    Yes [provider]  oxyCODONE (OXY IR/ROXICODONE) 5 MG immediate release tablet Take 1 tablet (5 mg total) by mouth every 4 (four) hours as needed for moderate pain (pain score 4-6). 02/25/18  Yes Toni Arthurs, NP  polyethylene glycol (MIRALAX / GLYCOLAX) packet Take 17 g by mouth daily.    Yes [provider]  predniSONE (DELTASONE) 20 MG tablet Take 40 mg by mouth daily with breakfast.   Yes [provider]  predniSONE (DELTASONE) 5 MG tablet Take 5 mg by mouth 2 (two) times  daily with a meal.    Yes [provider]  simvastatin (ZOCOR) 80 MG tablet Take 80mg s orally at bedtime 05/22/16  Yes [provider]  sodium chloride 0.9 % infusion Inject 65 mLs into the vein continuous. 02/26/18 03/01/18 Yes [provider]  sorbitol 70 % solution Take 30 mLs by mouth every 2 (two) hours as needed (constipation).   Yes [provider]  ALPRAZolam (XANAX) 0.5 MG tablet Take 1 tablet (0.5 mg total) by mouth 4 (four) times daily. 02/25/18   Toni Arthurs, NP   No results found.  Positive ROS: All other systems have been reviewed and were otherwise negative with the exception of those mentioned in the HPI and as above.  Physical Exam: General: Alert, no acute distress  MUSCULOSKELETAL: Left hip:  Dressing remains C/D/I.  Thigh compartments are soft and compressible.  Pedal pulses are palpable.  Patient can flex and extend her toes.  She has intact sensation to light touch.  Assessment: Anemia following left hip hemiarthroplasty  Plan: Patient to have endoscopy to look for source of anemia.  WBC is 20.  CT angio shows no PE.  Possible apiration pneumonitis with streaky ground-glass peribronchial opacities in the bilateral lower lobes in dependent distribution.  Patient has history of pulmonary fibrosis.  Ordering urine culture and analysis.  Continue ceftriaxone.  Will change dressing tomorrow.  PT ordered.  Recheck labs in the AM.   Thornton Park, MD    03/01/2018 9:32 PM

## 2018-03-02 ENCOUNTER — Inpatient Hospital Stay: Payer: Medicare Other | Admitting: Certified Registered"

## 2018-03-02 ENCOUNTER — Inpatient Hospital Stay: Payer: Medicare Other

## 2018-03-02 ENCOUNTER — Encounter
Admission: EM | Disposition: A | Discharge status: Skilled Nursing Facility | Payer: Self-pay | Source: Home / Self Care | Attending: Internal Medicine

## 2018-03-02 DIAGNOSIS — T18128A Food in esophagus causing other injury, initial encounter: Secondary | ICD-10-CM

## 2018-03-02 DIAGNOSIS — K210 Gastro-esophageal reflux disease with esophagitis: Secondary | ICD-10-CM

## 2018-03-02 DIAGNOSIS — K3189 Other diseases of stomach and duodenum: Secondary | ICD-10-CM

## 2018-03-02 DIAGNOSIS — D649 Anemia, unspecified: Secondary | ICD-10-CM

## 2018-03-02 HISTORY — PX: ESOPHAGOGASTRODUODENOSCOPY: SHX5428

## 2018-03-02 LAB — CBC WITH DIFFERENTIAL/PLATELET
BAND NEUTROPHILS: 3 %
BASOS PCT: 0 %
BLASTS: 0 %
Basophils Absolute: 0 10*3/uL (ref 0–0.1)
EOS ABS: 0.4 10*3/uL (ref 0–0.7)
EOS PCT: 2 %
HEMATOCRIT: 33.4 % — AB (ref 35.0–47.0)
Hemoglobin: 10.8 g/dL — ABNORMAL LOW (ref 12.0–16.0)
LYMPHS ABS: 1.8 10*3/uL (ref 1.0–3.6)
LYMPHS PCT: 9 %
MCH: 27.4 pg (ref 26.0–34.0)
MCHC: 32.2 g/dL (ref 32.0–36.0)
MCV: 85 fL (ref 80.0–100.0)
MONO ABS: 1.6 10*3/uL — AB (ref 0.2–0.9)
MONOS PCT: 8 %
Metamyelocytes Relative: 6 %
Myelocytes: 7 %
NEUTROS ABS: 15.8 10*3/uL — AB (ref 1.4–6.5)
Neutrophils Relative %: 65 %
OTHER: 0 %
Platelets: 402 10*3/uL (ref 150–440)
Promyelocytes Absolute: 0 %
RBC: 3.93 MIL/uL (ref 3.80–5.20)
RDW: 16.7 % — AB (ref 11.5–14.5)
WBC: 19.6 10*3/uL — ABNORMAL HIGH (ref 3.6–11.0)
nRBC: 0 /100 WBC

## 2018-03-02 LAB — BASIC METABOLIC PANEL
Anion gap: 10 (ref 5–15)
BUN: 38 mg/dL — ABNORMAL HIGH (ref 6–20)
CALCIUM: 8.6 mg/dL — AB (ref 8.9–10.3)
CO2: 25 mmol/L (ref 22–32)
CREATININE: 1.11 mg/dL — AB (ref 0.44–1.00)
Chloride: 98 mmol/L — ABNORMAL LOW (ref 101–111)
GFR calc non Af Amer: 48 mL/min — ABNORMAL LOW (ref >60)
GFR, EST AFRICAN AMERICAN: 56 mL/min — AB (ref >60)
Glucose, Bld: 101 mg/dL — ABNORMAL HIGH (ref 65–99)
Potassium: 4.4 mmol/L (ref 3.5–5.1)
Sodium: 133 mmol/L — ABNORMAL LOW (ref 135–145)

## 2018-03-02 LAB — URINALYSIS, ROUTINE W REFLEX MICROSCOPIC
BILIRUBIN URINE: NEGATIVE
BILIRUBIN URINE: NEGATIVE
GLUCOSE, UA: NEGATIVE mg/dL
GLUCOSE, UA: NEGATIVE mg/dL
HGB URINE DIPSTICK: NEGATIVE
Hgb urine dipstick: NEGATIVE
KETONES UR: NEGATIVE mg/dL
Ketones, ur: NEGATIVE mg/dL
Leukocytes, UA: NEGATIVE
Leukocytes, UA: NEGATIVE
NITRITE: NEGATIVE
Nitrite: NEGATIVE
PH: 5 (ref 5.0–8.0)
PH: 5 (ref 5.0–8.0)
Protein, ur: NEGATIVE mg/dL
Protein, ur: NEGATIVE mg/dL
SPECIFIC GRAVITY, URINE: 1.011 (ref 1.005–1.030)
Specific Gravity, Urine: 1.01 (ref 1.005–1.030)

## 2018-03-02 LAB — TYPE AND SCREEN
ABO/RH(D): O POS
ANTIBODY SCREEN: NEGATIVE
UNIT DIVISION: 0
Unit division: 0

## 2018-03-02 LAB — BPAM RBC
Blood Product Expiration Date: 201904102359
Blood Product Expiration Date: 201904102359
ISSUE DATE / TIME: 201903250345
ISSUE DATE / TIME: 201903250115
UNIT TYPE AND RH: 5100
Unit Type and Rh: 5100

## 2018-03-02 LAB — PATHOLOGIST SMEAR REVIEW

## 2018-03-02 SURGERY — EGD (ESOPHAGOGASTRODUODENOSCOPY)
Anesthesia: General

## 2018-03-02 MED ORDER — FENTANYL CITRATE (PF) 100 MCG/2ML IJ SOLN
INTRAMUSCULAR | Status: AC
Start: 2018-03-02 — End: ?
  Filled 2018-03-02: qty 2

## 2018-03-02 MED ORDER — LIDOCAINE HCL (CARDIAC) 20 MG/ML IV SOLN
INTRAVENOUS | Status: DC | PRN
  Administered 2018-03-02: 60 mg via INTRAVENOUS

## 2018-03-02 MED ORDER — IPRATROPIUM-ALBUTEROL 0.5-2.5 (3) MG/3ML IN SOLN: 3.0000 mL | RESPIRATORY_TRACT | Status: DC | PRN

## 2018-03-02 MED ORDER — IPRATROPIUM-ALBUTEROL 0.5-2.5 (3) MG/3ML IN SOLN
RESPIRATORY_TRACT | Status: AC
  Administered 2018-03-02: 3 mL
  Filled 2018-03-02: qty 3

## 2018-03-02 MED ORDER — PROPOFOL 10 MG/ML IV BOLUS
INTRAVENOUS | Status: DC | PRN
  Administered 2018-03-02: 20 mg via INTRAVENOUS
  Administered 2018-03-02: 50 mg via INTRAVENOUS

## 2018-03-02 MED ORDER — FENTANYL CITRATE (PF) 100 MCG/2ML IJ SOLN
INTRAMUSCULAR | Status: DC | PRN
  Administered 2018-03-02: 50 ug via INTRAVENOUS

## 2018-03-02 MED ORDER — PROPOFOL 10 MG/ML IV BOLUS
INTRAVENOUS | Status: AC
  Filled 2018-03-02: qty 40

## 2018-03-02 MED ORDER — ALBUTEROL SULFATE (2.5 MG/3ML) 0.083% IN NEBU
2.5000 mg | INHALATION_SOLUTION | Freq: Once | RESPIRATORY_TRACT | Status: AC
  Administered 2018-03-02: 2.5 mg via RESPIRATORY_TRACT
  Filled 2018-03-02: qty 3

## 2018-03-02 NOTE — Progress Notes (Signed)
[place] [facility] [place] NAME: Erin [name]    MR#:  [id number]  DATE OF BIRTH:  10/20/1946  SUBJECTIVE:  CHIEF COMPLAINT:   Chief Complaint  Patient presents with  . Abnormal Lab   No shortness of breath/cough today.  Pain well controlled.  REVIEW OF SYSTEMS:    Review of Systems  Constitutional: Positive for malaise/fatigue. Negative for chills and fever.  HENT: Negative for sore throat.   Eyes: Negative for blurred vision, double vision and pain.  Respiratory: Negative for cough, hemoptysis, shortness of breath and wheezing.   Cardiovascular: Negative for chest pain, palpitations, orthopnea and leg swelling.  Gastrointestinal: Negative for abdominal pain, constipation, diarrhea, heartburn, nausea and vomiting.  Genitourinary: Negative for dysuria and hematuria.  Musculoskeletal: Positive for joint pain. Negative for back pain.  Skin: Negative for rash.  Neurological: Negative for sensory change, speech change, focal weakness and headaches.  Endo/Heme/Allergies: Does not bruise/bleed easily.  Psychiatric/Behavioral: Negative for depression. The patient is not nervous/anxious.     DRUG ALLERGIES:   Allergies  Allergen Reactions  . Remicade [Infliximab] Shortness Of Breath and Itching  . Sulfa Antibiotics Other (See Comments)    Other Reaction: tounge cracked  . Actonel [Risedronate Sodium] Other (See Comments)  . Fosamax [Alendronate Sodium]     GI Bleed   . Procardia [Nifedipine] Hives  . Sulfur Swelling  . Wellbutrin [Bupropion] Anxiety    VITALS:  Blood pressure 111/63, pulse 94, temperature 99.6 F (37.6 C), temperature source Tympanic, resp. rate 20, height 5\' 2"  (1.575 m), weight 100.2 kg (221 lb), SpO2 (!) 86 %.  PHYSICAL EXAMINATION:   Physical Exam  GENERAL:  71 y.o.-year-old patient lying in the bed with no acute distress.  EYES: Pupils equal, round, reactive to light and accommodation. No scleral icterus.  Extraocular muscles intact.  HEENT: Head atraumatic, normocephalic. Oropharynx and nasopharynx clear.  NECK:  Supple, no jugular venous distention. No thyroid enlargement, no tenderness.  LUNGS: Normal breath sounds bilaterally, no wheezing, rales, rhonchi. No use of accessory muscles of respiration.  CARDIOVASCULAR: S1, S2 normal. No murmurs, rubs, or gallops.  ABDOMEN: Soft, nontender, nondistended. Bowel sounds present. No organomegaly or mass.  EXTREMITIES: No cyanosis, clubbing or edema b/l.    Left hip surgical wound with dressing NEUROLOGIC: Cranial nerves II through XII are intact. No focal Motor or sensory deficits b/l.   PSYCHIATRIC: The patient is alert and oriented x 3.  SKIN: No obvious rash, lesion, or ulcer.   LABORATORY PANEL:   CBC Recent Labs  Lab 03/02/18 0435  WBC 19.6*  HGB 10.8*  HCT 33.4*  PLT 402   ------------------------------------------------------------------------------------------------------------------ Chemistries  Recent Labs  Lab 03/01/18 0830 03/02/18 0435  NA 135 133*  K 5.0 4.4  CL 101 98*  CO2 25 25  GLUCOSE 82 101*  BUN 33* 38*  CREATININE 0.75 1.11*  CALCIUM 8.4* 8.6*  AST 66*  --   ALT 95*  --   ALKPHOS 126  --   BILITOT 0.7  --    ------------------------------------------------------------------------------------------------------------------  Cardiac Enzymes No results for input(s): TROPONINI in the last 168 hours. ------------------------------------------------------------------------------------------------------------------  RADIOLOGY:  Dg Hip Unilat With Pelvis 1v Left  Result Date: 03/02/2018 CLINICAL DATA:  Pain EXAM: DG HIP (WITH OR WITHOUT PELVIS) 2V*L* COMPARISON:  February 23, 2018 FINDINGS: Frontal pelvis as well as lateral left hip images were obtained. There is a total hip replacement on the left with prosthetic components well-seated. No acute fracture  or dislocation. Right hip joint appears unremarkable.  Sacroiliac joints appear unremarkable. IMPRESSION: Total hip replacement on the left prosthetic components well-seated. No acute fracture or dislocation. No appreciable arthropathic change. Electronically Signed   By: Lowella Grip III M.D.   On: 03/02/2018 09:14     ASSESSMENT AND PLAN:   *  Symptomatic anemia Acute on chronic anemia GI loss. But also had recent left hip surgery likely contributing S/P 2 Unit PRBC transfusion EGD pending Hemoglobin stable today.  *  Idiopathic interstitial fibrosis of lung  continue home meds   * Scleroderma continue home medications including immunosuppressant  *  [name] (coronary artery disease), native coronary artery -continue home meds  *  Hyperlipidemia -home dose antilipid  All the records are reviewed and case discussed with Care Management/Social Worker Management plans discussed with the patient, family and they are in agreement.  CODE STATUS: FULL CODE  DVT Prophylaxis: SCDs  TOTAL TIME TAKING CARE OF THIS PATIENT: 35 minutes.   POSSIBLE D/C IN 2-3 DAYS, DEPENDING ON CLINICAL CONDITION.  Leia Alf [name] M.D on 03/02/2018 at 10:41 AM  Between 7am to 6pm - Pager - [phone]  After 6pm go to [email or web address] - password [email or web address] [place] [facility]  Office  [phone]  CC: Primary care physician; Idelle Crouch, MD  Note: This dictation was prepared with Dragon dictation along with smaller phrase technology. Any transcriptional errors that result from this process are unintentional.

## 2018-03-02 NOTE — Op Note (Addendum)
[facility] Gastroenterology Patient Name: Erin [name] Procedure Date: 03/02/2018 10:24 AM MRN: [id number] Account #: [id number] Date of Birth: 1946/09/09 Admit Type: Inpatient Age: 72 Room: [facility] ENDO ROOM 2 Gender: Female Note Status: Finalized Procedure:            Upper GI endoscopy Indications:          Iron deficiency anemia secondary to chronic blood loss Providers:            Maxtyn Nuzum B. Bonna Gains MD, MD Referring MD:         [name]. Doy Hutching, MD (Referring MD) Medicines:            Monitored Anesthesia Care Complications:        No immediate complications. Procedure:            Pre-Anesthesia Assessment:                       - The risks and benefits of the procedure and the                        sedation options and risks were discussed with the                        patient. All questions were answered and informed                        consent was obtained.                       - Patient identification and proposed procedure were                        verified prior to the procedure.                       - ASA Grade Assessment: IV - A patient with severe                        systemic disease that is a constant threat to life.                       After obtaining informed consent, the endoscope was                        passed under direct vision. Throughout the procedure,                        the patient's blood pressure, pulse, and oxygen                        saturations were monitored continuously. The Endoscope                        was introduced through the mouth, and advanced to the                        third part of duodenum. The upper GI endoscopy was                        accomplished with ease. The patient tolerated the  procedure well. Findings:      2 pills were found in the distal esophagus. Advanced easily to the       stomach.      Non-severe esophagitis with no bleeding was found.      A single  localized, 3 mm non-bleeding erosion was found in the gastric       antrum. There were no stigmata of recent bleeding. Biopsies were not       done due to patient's anemia on admission.      There is no endoscopic evidence of bleeding or ulceration in the entire       examined stomach.      The examined duodenum was normal.      There is no endoscopic evidence of bleeding in the entire examined       duodenum. Impression:           - 2 pills in the distal esophagus, advanced to the                        stomach. No food present in esophagus. (Pt. has had                        documentation and reports history of problems                        swallowing that have been attributed to Scleroderma by                        her primary Gastroenterologist. Workup for achalasia                        can be considered by her primary gastroenterologist as                        an outpatient).                       - Non-severe reflux esophagitis.                       - Non-bleeding erosive gastropathy.                       - Normal examined duodenum.                       - No specimens collected. Recommendation:       - Return patient to hospital ward for ongoing care.                       - Use Prilosec (omeprazole) 20 mg PO daily for 4 weeks.                       - The small erosion does not explain her anemia by                        itself. She did not have any evidence of active GI                        bleeding before or during the admission. Explore other  causes of anemia. Consider Urinanalysis, Recent hip                        surgery site etc.                       - Check Ferritin and Iron studies and transfuse IV iron                        if patient has iron deficiency.                       - Perform an H. pylori serology today.                       - Mechanical soft diet.                       - Continue Serial CBCs and transfuse PRN                        - Return to GI clinic in 2 weeks with Dr. Gustavo Lah.                       - The findings and recommendations were discussed with                        the patient.                       - Follow an antireflux regimen. Procedure Code(s):    --- Professional ---                       [phone], Esophagogastroduodenoscopy, flexible, transoral;                        diagnostic, including collection of specimen(s) by                        brushing or washing, when performed (separate procedure) Diagnosis Code(s):    --- Professional ---                       N47.096G, Food in esophagus causing other injury,                        initial encounter                       K21.0, Gastro-esophageal reflux disease with esophagitis                       K31.89, Other diseases of stomach and duodenum                       D50.0, Iron deficiency anemia secondary to blood loss                        (chronic) CPT copyright 2016 American Medical Association. All rights reserved. The codes documented in this report are preliminary and upon coder review may  be revised to meet current compliance requirements.  Vonda Antigua, MD Margretta Sidle B. Anona Giovannini MD, MD 03/02/2018 11:04:08  AM This report has been signed electronically. Number of Addenda: 0 Note Initiated On: 03/02/2018 10:24 AM      [facility]

## 2018-03-02 NOTE — Transfer of Care (Signed)
Immediate Anesthesia Transfer of Care Note  Patient: Erin [name]  Procedure(s) Performed: ESOPHAGOGASTRODUODENOSCOPY (EGD) (N/A )  Patient Location: PACU  Anesthesia Type:General  Level of Consciousness: awake  Airway & Oxygen Therapy: Patient Spontanous Breathing and Patient connected to nasal cannula oxygen  Post-op Assessment: Report given to RN  Post vital signs: Reviewed and stable  Last Vitals:  Vitals Value Taken Time  BP    Temp    Pulse 87 03/02/2018 10:57 AM  Resp 23 03/02/2018 10:57 AM  SpO2 95 % 03/02/2018 10:57 AM  Vitals shown include unvalidated device data.  Last Pain:  Vitals:   03/02/18 1022  TempSrc: Tympanic  PainSc: 0-No pain      Patients Stated Pain Goal: 1 (21/97/58 8325)  Complications: No apparent anesthesia complications

## 2018-03-02 NOTE — Evaluation (Signed)
Physical Therapy Evaluation Patient Details Name: Erin [name] MRN: [id number] DOB: November 05, 1946 Today's Date: 03/02/2018   History of Present Illness  Pt is a 72 y.o. female who presents with anemia.  Patient was at a rehab facility after recent hip fracture repair.  She was noted on labs to have decreasing hemoglobin.  She was sent to the hospital for further evaluation.  She is on chronic prednisone for other immunologic conditions including scleroderma and rheumatoid arthritis.  She is also been on iron tablets due to her anemia.  Her guaiac test in the ED was positive.  Patient has had some recent increasing symptoms of fatigue and shortness of breath.  However, she is also currently being treated for aspiration pneumonitis/pneumonia.  She denies any significant episodes of melena or frank GI bleeding.  PRBC transfusion was ordered in the ED and hospitalist were called for admission for further evaluation.  Assessment includes: Recent L hip hemiarthroplasty, symptomatic anemia s/p transfusion, interstitial fibrosis of the lung, scleroderma, [name], and HLD.    Clinical Impression  Pt presents with deficits in strength, transfers, mobility, gait, balance, and activity tolerance.  Pt overall very weak and required extensive assist with bed mobility and transfers along with heavy cueing to maintain posterior hip precautions to the L hip.   Pt buckled upon taking one step backwards toward the EOB and was forced to return to sitting at EOB.  Pt is a high fall risk and may benefit from a [place] lift for transfers.  Of note, a moderate amount of serosanguineous-like drainage was noted on pt's gown and bed sheet near her L hip bandaging after pt returned to supine, nursing notified.  Pt will benefit from PT services in a SNF setting upon discharge to safely address above deficits for decreased caregiver assistance and eventual return to PLOF.      Follow Up Recommendations SNF    Equipment  Recommendations  Other (comment)(TBD at next venue of care)    Recommendations for Other Services       Precautions / Restrictions Precautions Precautions: Fall;Posterior Hip Precaution Booklet Issued: Yes (comment) Precaution Comments: Posterior hip precaution education and review provided Restrictions Weight Bearing Restrictions: Yes LLE Weight Bearing: Weight bearing as tolerated      Mobility  Bed Mobility Overal bed mobility: Needs Assistance Bed Mobility: Supine to Sit;Sit to Supine;Rolling Rolling: Mod assist   Supine to sit: Max assist Sit to supine: Max assist   General bed mobility comments: Mod verbal cues for sequencing to maintain hip precautions and with pillow placed between the knees  Transfers Overall transfer level: Needs assistance Equipment used: Rolling walker (2 wheeled) Transfers: Sit to/from Stand Sit to Stand: From elevated surface;Mod assist         General transfer comment: Mod verbal cues for sequencing to maintain hip precautions with pt requiring elevated EOB to stand   Ambulation/Gait Ambulation/Gait assistance: Mod assist Ambulation Distance (Feet): 1 Feet Assistive device: Rolling walker (2 wheeled) Gait Pattern/deviations: Step-to pattern   Gait velocity interpretation: <1.8 ft/sec, indicative of risk for recurrent falls General Gait Details: Pt tood one step at EOB with BLEs buckling and pt returning to sitting at EOB; Pt is a high fall risk  Stairs            Wheelchair Mobility    Modified Rankin (Stroke Patients Only)       Balance Overall balance assessment: Needs assistance Sitting-balance support: Feet supported;Bilateral upper extremity supported Sitting balance-Leahy Scale: Fair  Standing balance support: Bilateral upper extremity supported Standing balance-Leahy Scale: Poor                               Pertinent Vitals/Pain Pain Assessment: No/denies pain    Home Living  Family/patient expects to be discharged to:: Private residence Living Arrangements: Alone Available Help at Discharge: Friend(s);Available PRN/intermittently Type of Home: House Home Access: Stairs to enter Entrance Stairs-Rails: None Entrance Stairs-Number of Steps: 2 Home Layout: One level Home Equipment: Cane - single point;Shower seat;Walker - 2 wheels      Prior Function Level of Independence: Independent with assistive device(s)         Comments: Mod Ind with amb with RW limited community distances with no other fall history other than recent fall leading to hip hemiarthroplasty, Ind with ADLs, friend drives for errands. Pt endorses that ADL at home are "getting harder" to perform independently.     Hand Dominance   Dominant Hand: Right    Extremity/Trunk Assessment   Upper Extremity Assessment Upper Extremity Assessment: Generalized weakness    Lower Extremity Assessment Lower Extremity Assessment: Generalized weakness       Communication   Communication: No difficulties  Cognition Arousal/Alertness: Awake/alert Behavior During Therapy: WFL for tasks assessed/performed Overall Cognitive Status: Within Functional Limits for tasks assessed                                        General Comments      Exercises Total Joint Exercises Ankle Circles/Pumps: AROM;Both;5 reps;10 reps Quad Sets: Strengthening;Both;5 reps;10 reps Gluteal Sets: Strengthening;Both;5 reps;10 reps Heel Slides: AAROM;Both;10 reps(limited range on the LLE to maintain hip precautions) Hip ABduction/ADduction: AAROM;Both;10 reps Straight Leg Raises: AAROM;Both;10 reps Long Arc Quad: AROM;Both;10 reps Knee Flexion: AROM;Both;10 reps Other Exercises Other Exercises: reviewed hip precautions and HEP for BLE APs, QS, and GS   Assessment/Plan    PT Assessment Patient needs continued PT services  PT Problem List Decreased strength;Decreased activity tolerance;Decreased  balance;Decreased knowledge of use of DME;Decreased knowledge of precautions;Decreased mobility       PT Treatment Interventions Gait training;DME instruction;Functional mobility training;Balance training;Therapeutic exercise;Therapeutic activities;Patient/family education    PT Goals (Current goals can be found in the Care Plan section)  Acute Rehab PT Goals Patient Stated Goal: To get stronger PT Goal Formulation: With patient Time For Goal Achievement: 03/15/18 Potential to Achieve Goals: Fair    Frequency 7X/week   Barriers to discharge Inaccessible home environment;Decreased caregiver support      Co-evaluation               AM-PAC PT "6 Clicks" Daily Activity  Outcome Measure Difficulty turning over in bed (including adjusting bedclothes, sheets and blankets)?: Unable Difficulty moving from lying on back to sitting on the side of the bed? : Unable   Help needed moving to and from a bed to chair (including a wheelchair)?: Total Help needed walking in hospital room?: Total Help needed climbing 3-5 steps with a railing? : Total 6 Click Score: 5    End of Session Equipment Utilized During Treatment: Gait belt Activity Tolerance: Patient tolerated treatment well Patient left: in bed;with bed alarm set;with call bell/phone within reach Nurse Communication: Mobility status PT Visit Diagnosis: Other abnormalities of gait and mobility (R26.89);Muscle weakness (generalized) (M62.81)    Time: 3474-2595 PT Time Calculation (min) (ACUTE  ONLY): 35 min   Charges:   PT Evaluation $PT Eval Low Complexity: 1 Low PT Treatments $Therapeutic Exercise: 8-22 mins $Therapeutic Activity: 8-22 mins   PT G Codes:        DRoyetta Asal PT, DPT 03/02/18, 6:05 PM

## 2018-03-02 NOTE — Anesthesia Post-op Follow-up Note (Signed)
Anesthesia QCDR form completed.        

## 2018-03-02 NOTE — Progress Notes (Addendum)
Subjective:  Patient seen with Erin [name], her RN.  Patient reports left hip pain as mild at rest.  Patient states endoscopy showed to active bleeding.  Patient participated with PT today.  Objective:   VITALS:   Vitals:   03/02/18 1530 03/02/18 1536 03/02/18 1634 03/02/18 1751  BP: (!) 127/51  129/66   Pulse: 80  94 100  Resp:      Temp: 98.7 F (37.1 C)  98.2 F (36.8 C)   TempSrc: Oral  Oral   SpO2: 95% 96% 96% 94%  Weight:      Height:        PHYSICAL EXAM: Left lower extremity:I changed patient's dressing today with the help of her nurse. Incision is cry and intact. There is no active drainage. Staples are in place.  Patient has swelling and ecchymosis in the left thigh and hip but her compartments are soft and compressible. There is no associated erythema. Neurovascular intact Sensation intact distally Intact pulses distally Dorsiflexion/Plantar flexion intact Incision: no drainage No cellulitis present Compartment soft  LABS  Results for orders placed or performed during the hospital encounter of 02/28/18 (from the past 24 hour(s))  CBC with Differential/Platelet     Status: Abnormal   Collection Time: 03/02/18  4:35 AM  Result Value Ref Range   WBC 19.6 (H) 3.6 - 11.0 K/uL   RBC 3.93 3.80 - 5.20 MIL/uL   Hemoglobin 10.8 (L) 12.0 - 16.0 g/dL   HCT 33.4 (L) 35.0 - 47.0 %   MCV 85.0 80.0 - 100.0 fL   MCH 27.4 26.0 - 34.0 pg   MCHC 32.2 32.0 - 36.0 g/dL   RDW 16.7 (H) 11.5 - 14.5 %   Platelets 402 150 - 440 K/uL   Neutrophils Relative % 65 %   Lymphocytes Relative 9 %   Monocytes Relative 8 %   Eosinophils Relative 2 %   Basophils Relative 0 %   Band Neutrophils 3 %   Metamyelocytes Relative 6 %   Myelocytes 7 %   Promyelocytes Absolute 0 %   Blasts 0 %   nRBC 0 0 /100 WBC   Other 0 %   Neutro Abs 15.8 (H) 1.4 - 6.5 K/uL   Lymphs Abs 1.8 1.0 - 3.6 K/uL   Monocytes Absolute 1.6 (H) 0.2 - 0.9 K/uL   Eosinophils Absolute 0.4 0 - 0.7 K/uL   Basophils  Absolute 0.0 0 - 0.1 K/uL   RBC Morphology MIXED RBC POPULATION   Basic metabolic panel     Status: Abnormal   Collection Time: 03/02/18  4:35 AM  Result Value Ref Range   Sodium 133 (L) 135 - 145 mmol/L   Potassium 4.4 3.5 - 5.1 mmol/L   Chloride 98 (L) 101 - 111 mmol/L   CO2 25 22 - 32 mmol/L   Glucose, Bld 101 (H) 65 - 99 mg/dL   BUN 38 (H) 6 - 20 mg/dL   Creatinine, Ser 1.11 (H) 0.44 - 1.00 mg/dL   Calcium 8.6 (L) 8.9 - 10.3 mg/dL   GFR calc non Af Amer 48 (L) >60 mL/min   GFR calc Af Amer 56 (L) >60 mL/min   Anion gap 10 5 - 15  Pathologist smear review     Status: None   Collection Time: 03/02/18  4:35 AM  Result Value Ref Range   Path Review Peripheral blood smear is reviewed.   Urinalysis, Routine w reflex microscopic     Status: Abnormal   Collection Time: 03/02/18  6:51 AM  Result Value Ref Range   Color, Urine YELLOW (A) YELLOW   APPearance CLEAR (A) CLEAR   Specific Gravity, Urine 1.011 1.005 - 1.030   pH 5.0 5.0 - 8.0   Glucose, UA NEGATIVE NEGATIVE mg/dL   Hgb urine dipstick NEGATIVE NEGATIVE   Bilirubin Urine NEGATIVE NEGATIVE   Ketones, ur NEGATIVE NEGATIVE mg/dL   Protein, ur NEGATIVE NEGATIVE mg/dL   Nitrite NEGATIVE NEGATIVE   Leukocytes, UA NEGATIVE NEGATIVE    Dg Hip Unilat With Pelvis 1v Left  Result Date: 03/02/2018 CLINICAL DATA:  Pain EXAM: DG HIP (WITH OR WITHOUT PELVIS) 2V*L* COMPARISON:  February 23, 2018 FINDINGS: Frontal pelvis as well as lateral left hip images were obtained. There is a total hip replacement on the left with prosthetic components well-seated. No acute fracture or dislocation. Right hip joint appears unremarkable. Sacroiliac joints appear unremarkable. IMPRESSION: Total hip replacement on the left prosthetic components well-seated. No acute fracture or dislocation. No appreciable arthropathic change. Electronically Signed   By: Lowella Grip III M.D.   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 Patient's dressing may be changed when necessary. Patient is weightbearing as tolerated in the left lower extremity.  X-ray films of the left hip and pelvis demonstrate no fracture or dislocation.  Anticoagulation per medicine in light of recent anemia.    Thornton Park , MD 03/02/2018, 6:51 PM

## 2018-03-02 NOTE — Anesthesia Preprocedure Evaluation (Signed)
Anesthesia Evaluation  Patient identified by MRN, date of birth, ID band Patient awake    Reviewed: Allergy & Precautions, H&P , NPO status , Patient's Chart, lab work & pertinent test results, reviewed documented beta blocker date and time   Airway Mallampati: II   Neck ROM: full    Dental  (+) Poor Dentition   Pulmonary neg pulmonary ROS, shortness of breath,    Pulmonary exam normal        Cardiovascular Exercise Tolerance: Poor hypertension, + [name] and + Peripheral Vascular Disease  negative cardio ROS Normal cardiovascular exam+ Valvular Problems/Murmurs  Rhythm:regular Rate:Normal     Neuro/Psych negative neurological ROS  negative psych ROS   GI/Hepatic negative GI ROS, Neg liver ROS, PUD, GERD  ,  Endo/Other  negative endocrine ROS  Renal/GU negative Renal ROS  negative genitourinary   Musculoskeletal   Abdominal   Peds  Hematology negative hematology ROS (+) anemia ,   Anesthesia Other Findings Past Medical History: No date: Anemia     Comment:  h/o No date: Cancer ([place])     Comment:  melanoma x 2 the last one was of her leg and surgeon               removed it No date: Colon polyp No date: Coronary artery calcification No date: Coronary artery disease No date: DDD (degenerative disc disease), lumbar 01/20/2017: Diverticulosis No date: Dysphagia No date: Dyspnea     Comment:  with exertion-unable to walk a mile without getting sob-              dr sparks set pt up to see Dr Rockey Situ after 11-19-16               surgery No date: Environmental allergies No date: Esophageal dysmotility No date: Fibrocystic breast disease No date: GERD (gastroesophageal reflux disease) No date: Heart murmur     Comment:  h/o  No date: Hyperlipidemia     Comment:  unspecified No date: Hypertension No date: Mitral regurgitation 01/20/2017: Monilial esophagitis (HCC) No date: Obstipation No date: Osteopenia No  date: Peptic ulcer disease No date: Pulmonary fibrosis (HCC)     Comment:  per Dr Raul Del No date: RA (rheumatoid arthritis) ([place]) No date: Raynaud's disease 01/20/2017: Redundant colon No date: Scleroderma ([place]) No date: Telangiectasia of colon 01/20/2017: Tubular adenoma of colon     Comment:  unspecified No date: Vulvar dysplasia Past Surgical History: No date: ABDOMINAL HYSTERECTOMY 20+ years ago: BREAST CYST EXCISION; Left     Comment:  No scar visible No date: BREAST LUMPECTOMY; Right No date: CARPAL TUNNEL RELEASE; Bilateral No date: COLON SURGERY     Comment:  colon polyp  09/30/2011: COLONOSCOPY     Comment:  tubular adenoma rtm No date: COLONOSCOPY     Comment:  05/02/2003, 04/10/2000 01/20/2017: COLONOSCOPY WITH PROPOFOL; N/A     Comment:  Procedure: COLONOSCOPY WITH PROPOFOL;  Surgeon: Lollie Sails, MD;  Location: [facility] ENDOSCOPY;  Service:               Endoscopy;  Laterality: N/A; No date: ESOPHAGOGASTRODUODENOSCOPY No date: ESOPHAGOGASTRODUODENOSCOPY     Comment:  09/30/2011, 04/10/2000 , no repeat rtm 01/20/2017: ESOPHAGOGASTRODUODENOSCOPY (EGD) WITH PROPOFOL; N/A     Comment:  Procedure: ESOPHAGOGASTRODUODENOSCOPY (EGD) WITH               PROPOFOL;  Surgeon: Lollie Sails, MD;  Location:  [facility] ENDOSCOPY;  Service: Endoscopy;  Laterality: N/A; 11/19/2016: EXCISION HYDRADENITIS LABIA; Left     Comment:  Procedure: EXCISION LABIAL MASS;  Surgeon: Robert Bellow, MD;  Location: [facility] ORS;  Service: General;                Laterality: Left; No date: EYE SURGERY; Bilateral     Comment:  cataracts No date: HEEL SPUR EXCISION 02/23/2018: HIP ARTHROPLASTY; Left     Comment:  Procedure: ARTHROPLASTY BIPOLAR HIP (HEMIARTHROPLASTY);               Surgeon: Thornton Park, MD;  Location: [facility] ORS;                Service: Orthopedics;  Laterality: Left; 11/19/2016: MASS EXCISION; Left     Comment:  Procedure: EXCISION LEFT  THIGH MELANOMA;  Surgeon:               Robert Bellow, MD;  Location: [facility] ORS;  Service:               General;  Laterality: Left; 11/19/2016: SENTINEL NODE BIOPSY; Left     Comment:  Procedure: INGUINAL SENTINEL NODE BIOPSY;  Surgeon:               Robert Bellow, MD;  Location: [facility] ORS;  Service:               General;  Laterality: Left; 01/29/2017: UPPER ESOPHAGEAL ENDOSCOPIC ULTRASOUND (EUS); N/A     Comment:  Procedure: UPPER ESOPHAGEAL ENDOSCOPIC ULTRASOUND (EUS);              Surgeon: Holly Bodily, MD;  Location: [facility]               ENDOSCOPY;  Service: Endoscopy;  Laterality: N/A; BMI    Body Mass Index:  40.42 kg/m     Reproductive/Obstetrics negative OB ROS                             Anesthesia Physical Anesthesia Plan  ASA: IV and emergent  Anesthesia Plan: General   Post-op Pain Management:    Induction:   PONV Risk Score and Plan:   Airway Management Planned:   Additional Equipment:   Intra-op Plan:   Post-operative Plan:   Informed Consent: I have reviewed the patients History and Physical, chart, labs and discussed the procedure including the risks, benefits and alternatives for the proposed anesthesia with the patient or authorized representative who has indicated his/her understanding and acceptance.   Dental Advisory Given  Plan Discussed with: CRNA  Anesthesia Plan Comments:         Anesthesia Quick Evaluation

## 2018-03-03 ENCOUNTER — Encounter: Payer: Self-pay | Admitting: Gastroenterology

## 2018-03-03 LAB — URINE CULTURE: CULTURE: NO GROWTH

## 2018-03-03 MED ORDER — IPRATROPIUM-ALBUTEROL 0.5-2.5 (3) MG/3ML IN SOLN
3.0000 mL | Freq: Four times a day (QID) | RESPIRATORY_TRACT | Status: DC
  Administered 2018-03-03: 3 mL via RESPIRATORY_TRACT
  Filled 2018-03-03: qty 3

## 2018-03-03 MED ORDER — AMOXICILLIN-POT CLAVULANATE 875-125 MG PO TABS: 1.0000 | ORAL_TABLET | Freq: Two times a day (BID) | ORAL | 0 refills | Status: DC

## 2018-03-03 MED ORDER — FERROUS SULFATE 325 (65 FE) MG PO TBEC: 325.0000 mg | DELAYED_RELEASE_TABLET | Freq: Two times a day (BID) | ORAL | 3 refills | Status: DC

## 2018-03-03 MED ORDER — IPRATROPIUM-ALBUTEROL 0.5-2.5 (3) MG/3ML IN SOLN
3.0000 mL | RESPIRATORY_TRACT | Status: DC
  Administered 2018-03-03: 3 mL via RESPIRATORY_TRACT
  Filled 2018-03-03: qty 3

## 2018-03-03 MED ORDER — DEXLANSOPRAZOLE 60 MG PO CPDR: 60.0000 mg | DELAYED_RELEASE_CAPSULE | Freq: Every day | ORAL | 0 refills | Status: DC

## 2018-03-03 MED ORDER — AMOXICILLIN-POT CLAVULANATE 875-125 MG PO TABS: 1.0000 | ORAL_TABLET | Freq: Two times a day (BID) | ORAL | 0 refills | Status: AC

## 2018-03-03 NOTE — Progress Notes (Signed)
Patient is medically stable for D/C back to [facility] today. Per [facility] admissions coordinator at [facility] patient can return today to room 202-B. RN will call report at [phone] and arrange EMS for transport. Clinical Education officer, museum (CSW) sent D/C orders to [facility] via [employer]. Patient is aware of above. CSW contacted patient's son Stormy Card and made him aware of above. Please reconsult if future social work needs arise. CSW signing off.   [employer], LCSW [phone]

## 2018-03-03 NOTE — Progress Notes (Signed)
  Subjective:  Patient being discharged back to [facility] today.  Patient reports left hip pain as mild.    Objective:   VITALS:   Vitals:   03/02/18 1751 03/02/18 2226 03/03/18 0743 03/03/18 0834  BP:  125/62 129/68   Pulse: 100 83 75   Resp:  19    Temp:  98.8 F (37.1 C) 97.7 F (36.5 C)   TempSrc:  Oral Oral   SpO2: 94% 93%  93%  Weight:      Height:        PHYSICAL EXAM: Left lower extremity:  Neurovascular intact Sensation intact distally Intact pulses distally Dorsiflexion/Plantar flexion intact Incision: moderate serous drainage, no purulence, no erythema, no odor, resolving ecchymosis No cellulitis present Compartment soft  LABS  No results found for this or any previous visit (from the past 24 hour(s)).  Dg Hip Unilat With Pelvis 1v Left  Result Date: 03/02/2018 CLINICAL DATA:  Pain EXAM: DG HIP (WITH OR WITHOUT PELVIS) 2V*L* COMPARISON:  February 23, 2018 FINDINGS: Frontal pelvis as well as lateral left hip images were obtained. There is a total hip replacement on the left with prosthetic components well-seated. No acute fracture or dislocation. Right hip joint appears unremarkable. Sacroiliac joints appear unremarkable. IMPRESSION: Total hip replacement on the left prosthetic components well-seated. No acute fracture or dislocation. No appreciable arthropathic change. Electronically Signed   By: Lowella Grip III M.D.   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  Xrays of her left hip showed no fracture or dislocation.  Change dressings prn for drainage.  Continue Augmentin for 10 days.    Thornton Park , MD 03/03/2018, 12:38 PM

## 2018-03-03 NOTE — Progress Notes (Signed)
Patient is being discharged to [facility]. A&O x4.  Scripts and paperwork sent with patient packet.  Report called. EMS transporting.

## 2018-03-03 NOTE — Discharge Instructions (Addendum)
Full weight bearing with PT  F/U with Dr. Mack Guise of orthopedics in 1-2 weeks  F/U with Dr. Gustavo Lah in 1 week

## 2018-03-03 NOTE — Discharge Summary (Addendum)
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 Anemia    h/o  . Cancer ([place])    melanoma x 2 the last one was of her leg and surgeon removed it  . Colon polyp   . Coronary artery calcification   . Coronary artery disease   . DDD (degenerative disc disease), lumbar   . Diverticulosis 01/20/2017  . Dysphagia   . Dyspnea    with exertion-unable to walk a mile without getting sob- dr sparks set pt up to see Dr Rockey Situ after 11-19-16 surgery  . Environmental allergies   . Esophageal dysmotility   . Fibrocystic breast disease   . GERD (gastroesophageal reflux disease)   . Heart murmur    h/o   . Hyperlipidemia    unspecified  . Hypertension   . Mitral regurgitation   . Monilial esophagitis ([place]) 01/20/2017  . Obstipation   . Osteopenia   . Peptic ulcer disease   . Pulmonary fibrosis ([place])    per Dr Raul Del  . RA (rheumatoid arthritis) ([place])   . Raynaud's disease   . Redundant colon 01/20/2017  . Scleroderma ([place])   . Telangiectasia of colon   . Tubular adenoma of colon 01/20/2017   unspecified  . Vulvar dysplasia      ADMITTING HISTORY  HISTORY OF PRESENT ILLNESS:  Erin [name]  is a 72 y.o. female who  presents with anemia.  Patient is at a rehab facility after recent hip fracture repair.  She was noted on labs to have decreasing hemoglobin.  She was sent to the hospital for further evaluation.  She is on chronic prednisone for other immunologic conditions including scleroderma and rheumatoid arthritis.  She is also been on iron tablets due to her anemia.  Her guaiac test here in the ED was positive.  Patient has had some recent increasing symptoms of fatigue and shortness of breath.  However, she is also currently being treated for aspiration pneumonitis/pneumonia.  She denies any significant episodes of melena or frank GI bleeding.  PRBC transfusion was ordered in the ED and hospitalist were called for admission for further evaluation.  HOSPITAL COURSE:   *Symptomatic anemia Acute on chronic anemia GI loss, chronic likely from esophagitis. No acute bleeding on EGD S/P 2 Unit PRBC transfusion Hemoglobin stable . 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 Gustavo Lah   Blood loss likely combination of post op loss from hip and GI losses. Lovenox stopped and continue ASA.  * Aspiratiion pneumonia- POA Augmentin at discharge for 5 days for Pneumonia  *Idiopathic interstitial fibrosis of lung  continue home meds  *Scleroderma continue home medications including immunosuppressant  *[name] (coronary artery disease), native coronary artery -continue home meds  *Hyperlipidemia -home dose antilipid  *  Recent lip hip surgery Seen by Dr. Areatha Keas. No bleeding/infection. Dr. Lisette Grinder wanted abx for 10 days and I have extended her augmentin from 5 days to 10 days  Stable for discharge back to SNF  CONSULTS OBTAINED:  Treatment Team:  Virgel Manifold, MD  DRUG ALLERGIES:   Allergies  Allergen Reactions  . Remicade [Infliximab] Shortness Of Breath and Itching  . Sulfa Antibiotics Other (See Comments)    Other Reaction: tounge cracked   . Actonel [Risedronate Sodium] Other (See Comments)  . Fosamax [Alendronate Sodium]     GI Bleed   . Procardia [Nifedipine] Hives  . Sulfur Swelling  . Wellbutrin [Bupropion] Anxiety    DISCHARGE MEDICATIONS:   Allergies as of 03/03/2018      Reactions   Remicade [infliximab] Shortness Of Breath, Itching   Sulfa Antibiotics Other (See Comments)   Other Reaction: tounge cracked   Actonel [risedronate Sodium] Other (See Comments)   Fosamax [alendronate Sodium]    GI Bleed   Procardia [nifedipine] Hives   Sulfur Swelling   Wellbutrin [bupropion] Anxiety      Medication List    STOP taking these medications   cefTRIAXone 1-3.74 GM-%(50ML) IVPB Commonly known as:  ROCEPHIN   enoxaparin 40 MG/0.4ML injection Commonly known as:  LOVENOX   metroNIDAZOLE 500 MG tablet Commonly known as:  FLAGYL   sodium chloride 0.9 % infusion     TAKE these medications   ALPRAZolam 0.5 MG tablet Commonly known as:  XANAX Take 1 tablet (0.5 mg total) by mouth 4 (four) times daily. What changed:  when to take this   amLODipine 10 MG tablet Commonly known as:  NORVASC Take 5 mg by mouth 2 (two) times daily.   amoxicillin-clavulanate 875-125 MG tablet Commonly known as:  AUGMENTIN Take 1 tablet by mouth 2 (two) times daily for 10 days.   aspirin EC 81 MG tablet Take 81 mg by mouth at bedtime.   cetirizine 10 MG tablet Commonly known as:  ZYRTEC Take 10 mg by mouth daily as needed for allergies.   dexlansoprazole 60 MG capsule Commonly known as:  DEXILANT Take 1 capsule (60 mg total) by mouth daily.   ezetimibe 10 MG tablet Commonly known as:  ZETIA TAKE ONE TABLET BY MOUTH EVERY DAY   ferrous sulfate 325 (65 FE) MG tablet Take 1 tablet (325 mg total) by mouth 3 (three) times daily after meals.   Fish Oil 1000 MG Caps Take 1 capsule by mouth 2 (two) times daily.   FLUoxetine 20 MG capsule Commonly known as:  PROZAC Take 20 mg by mouth daily.   furosemide 20 MG  tablet Commonly known as:  LASIX Take 20mg s daily as needed for swelling   gabapentin 300 MG capsule Commonly known as:  NEURONTIN Take 300 mg by mouth 3 (three) times daily.   guaifenesin 100 MG/5ML syrup Commonly known as:  ROBITUSSIN Take 200 mg by mouth every 4 (four) hours as needed for cough.   hydroxychloroquine 200 MG tablet Commonly known as:  PLAQUENIL Take 200 mg by mouth 2 (two) times daily.   imipramine 25 MG tablet Commonly known as:  TOFRANIL Take 25 mg by mouth 2 (two) times daily.   ipratropium-albuterol 0.5-2.5 (3) MG/3ML Soln Commonly known as:  DUONEB Take 3 mLs by nebulization every 4 (four) hours as needed (wheezing, dyspnea).   latanoprost 0.005 % ophthalmic solution Commonly known as:  XALATAN Place 1 drop into both eyes at bedtime.   MULTI-VITAMINS Tabs  Take 1 tablet by mouth daily.   oxyCODONE 5 MG immediate release tablet Commonly known as:  Oxy IR/ROXICODONE Take 1 tablet (5 mg total) by mouth every 4 (four) hours as needed for moderate pain (pain score 4-6).   polyethylene glycol packet Commonly known as:  MIRALAX / GLYCOLAX Take 17 g by mouth daily.   predniSONE 5 MG tablet Commonly known as:  DELTASONE Take 5 mg by mouth 2 (two) times daily with a meal.   predniSONE 20 MG tablet Commonly known as:  DELTASONE Take 40 mg by mouth daily with breakfast.   simvastatin 80 MG tablet Commonly known as:  ZOCOR Take 80mg s orally at bedtime   sorbitol 70 % solution Take 30 mLs by mouth every 2 (two) hours as needed (constipation).       Today   VITAL SIGNS:  Blood pressure 129/68, pulse 75, temperature 97.7 F (36.5 C), temperature source Oral, resp. rate 19, height 5\' 2"  (1.575 m), weight 100.2 kg (221 lb), SpO2 93 %.  I/O:    Intake/Output Summary (Last 24 hours) at 03/03/2018 0836 Last data filed at 03/03/2018 0320 Gross per 24 hour  Intake 340 ml  Output 950 ml  Net -610 ml    PHYSICAL EXAMINATION:  Physical  Exam  GENERAL:  72 y.o.-year-old patient lying in the bed with no acute distress.  LUNGS: Normal breath sounds bilaterally, no wheezing, rales,rhonchi or crepitation. No use of accessory muscles of respiration.  CARDIOVASCULAR: S1, S2 normal. No murmurs, rubs, or gallops.  ABDOMEN: Soft, non-tender, non-distended. Bowel sounds present. No organomegaly or mass.  NEUROLOGIC: Moves all 4 extremities. PSYCHIATRIC: The patient is alert and oriented x 3.  SKIN: Left hip dressing  DATA REVIEW:   CBC Recent Labs  Lab 03/02/18 0435  WBC 19.6*  HGB 10.8*  HCT 33.4*  PLT 402    Chemistries  Recent Labs  Lab 03/01/18 0830 03/02/18 0435  NA 135 133*  K 5.0 4.4  CL 101 98*  CO2 25 25  GLUCOSE 82 101*  BUN 33* 38*  CREATININE 0.75 1.11*  CALCIUM 8.4* 8.6*  AST 66*  --   ALT 95*  --   ALKPHOS 126  --   BILITOT 0.7  --     Cardiac Enzymes No results for input(s): TROPONINI in the last 168 hours.  Microbiology Results  Results for orders placed or performed during the hospital encounter of 02/22/18  Surgical pcr screen     Status: None   Collection Time: 02/22/18 10:39 PM  Result Value Ref Range Status   MRSA, PCR NEGATIVE NEGATIVE Final   Staphylococcus aureus NEGATIVE NEGATIVE Final    Comment: (NOTE) The Xpert SA Assay (FDA approved for NASAL specimens in patients 72 years of age and older), is one component of a comprehensive surveillance program. It is not intended to diagnose infection nor to guide or monitor treatment. Performed at [facility], [address] [place], [place] [address]     RADIOLOGY:  Dg Hip Unilat With Pelvis 1v Left  Result Date: 03/02/2018 CLINICAL DATA:  Pain EXAM: DG HIP (WITH OR WITHOUT PELVIS) 2V*L* COMPARISON:  February 23, 2018 FINDINGS: Frontal pelvis as well as lateral left hip images were obtained. There is a total hip replacement on the left with prosthetic components well-seated. No acute fracture or dislocation. Right hip  joint appears unremarkable. Sacroiliac joints appear unremarkable. IMPRESSION: Total hip replacement on the left prosthetic components well-seated. No acute fracture or dislocation. No appreciable  arthropathic change. Electronically Signed   By: Lowella Grip III M.D.   On: 03/02/2018 09:14    Follow up with PCP in 1 week.  Management plans discussed with the patient, family and they are in agreement.  CODE STATUS:     Code Status Orders  (From admission, onward)        Start     Ordered   03/01/18 0023  Full code  Continuous     03/01/18 0022    Code Status History    Date Active Date Inactive Code Status Order ID Comments User Context   02/22/2018 1812 02/25/2018 1937 Full Code [id number]  Fritzi Mandes, MD Inpatient    Advance Directive Documentation     Most Recent Value  Type of Advance Directive  Healthcare Power of Attorney, Living will  Pre-existing out of facility DNR order (yellow form or pink MOST form)  -  "MOST" Form in Place?  -      TOTAL TIME TAKING CARE OF THIS PATIENT ON DAY OF DISCHARGE: more than 30 minutes.   Neita Carp M.D on 03/03/2018 at 8:36 AM  Between 7am to 6pm - Pager - [phone]  After 6pm go to [email or web address] - password [email or web address]  Office  [phone]  CC: Primary care physician; Idelle Crouch, MD  Note: This dictation was prepared with Dragon dictation along with smaller phrase technology. Any transcriptional errors that result from this process are unintentional.

## 2018-03-04 ENCOUNTER — Other Ambulatory Visit: Payer: Self-pay

## 2018-03-04 DIAGNOSIS — M8000XA Age-related osteoporosis with current pathological fracture, unspecified site, initial encounter for fracture: Secondary | ICD-10-CM | POA: Insufficient documentation

## 2018-03-04 MED ORDER — OXYCODONE HCL 5 MG PO TABS: 5.0000 mg | ORAL_TABLET | ORAL | 0 refills | Status: DC | PRN

## 2018-03-04 MED ORDER — ALPRAZOLAM 0.5 MG PO TABS: 0.5000 mg | ORAL_TABLET | Freq: Four times a day (QID) | ORAL | 0 refills | Status: DC

## 2018-03-04 NOTE — Telephone Encounter (Signed)
Rx sent to [facility] phone : [phone] , fax : [phone]  

## 2018-03-04 NOTE — Anesthesia Postprocedure Evaluation (Signed)
Anesthesia Post Note  Patient: Erin [name]  Procedure(s) Performed: ESOPHAGOGASTRODUODENOSCOPY (EGD) (N/A )  Patient location during evaluation: PACU Anesthesia Type: General Level of consciousness: awake and alert Pain management: pain level controlled Vital Signs Assessment: post-procedure vital signs reviewed and stable Respiratory status: spontaneous breathing, nonlabored ventilation, respiratory function stable and patient connected to nasal cannula oxygen Cardiovascular status: blood pressure returned to baseline and stable Postop Assessment: no apparent nausea or vomiting Anesthetic complications: no     Last Vitals:  Vitals:   03/03/18 0834 03/03/18 1658  BP:  136/64  Pulse:  94  Resp:  20  Temp:  36.9 C  SpO2: 93%     Last Pain:  Vitals:   03/03/18 1658  TempSrc: Oral  PainSc:                  Molli Barrows

## 2018-03-08 ENCOUNTER — Encounter
Admission: RE | Admit: 2018-03-08 | Discharge: 2018-03-08 | Disposition: A | Discharge status: Home / Self Care | Payer: Medicare Other | Source: Ambulatory Visit | Attending: Internal Medicine | Admitting: Internal Medicine

## 2018-03-16 ENCOUNTER — Non-Acute Institutional Stay (SKILLED_NURSING_FACILITY): Payer: Medicare Other | Admitting: Gerontology

## 2018-03-16 ENCOUNTER — Encounter: Payer: Self-pay | Admitting: Gerontology

## 2018-03-16 DIAGNOSIS — E46 Unspecified protein-calorie malnutrition: Secondary | ICD-10-CM

## 2018-03-16 DIAGNOSIS — R601 Generalized edema: Secondary | ICD-10-CM | POA: Diagnosis not present

## 2018-03-16 DIAGNOSIS — Z96649 Presence of unspecified artificial hip joint: Secondary | ICD-10-CM

## 2018-03-16 DIAGNOSIS — S72002A Fracture of unspecified part of neck of left femur, initial encounter for closed fracture: Secondary | ICD-10-CM | POA: Diagnosis not present

## 2018-03-16 NOTE — Progress Notes (Signed)
Location:    Nursing Home Room Number: 202B Place of Service:  SNF (31) Provider:  Toni Arthurs, NP-C  Sparks, [name], MD  Patient Care Team: Idelle Crouch, MD as PCP - General (Internal Medicine) Bary Castilla Forest Gleason, MD (General Surgery) Jannet Mantis, MD (Dermatology) Minna Merritts, MD as Consulting Physician (Cardiology) Clent Jacks, RN as Registered Nurse  Extended Emergency Contact Information Primary Emergency Contact: Maryclare Bean [place] of [place] Phone: [phone] Relation: Relative Secondary Emergency Contact: Herbie Drape [place] of [place] Phone: [phone] Relation: Son  Code Status:  FULL Goals of care: Advanced Directive information Advanced Directives 03/02/2018  Does Patient Have a Medical Advance Directive? Yes  Type of Paramedic of [place];Living will  Does patient want to make changes to medical advance directive? -  Copy of [place] in Chart? No - copy requested  Would patient like information on creating a medical advance directive? No - Patient declined     Chief Complaint  Patient presents with  . Medical Management of Chronic Issues    Routine Visit    HPI:  Pt is a 72 y.o. female seen today for medical management of chronic diseases. Pt was readmitted to the facility for rehab following initial hospitalization for fall with left hip fracture and surgical repair. Pt was readmitted to [facility] with PNA, GIB, anemia- received transfusion. Pt is now participating in PT/OT. Pt reports she is still weak but feeling better. She does have generalized pitting edema. Pt reports she has been edematous since hospitalization d/t IVF, etc. Appetite is diminished. Pt denies chest pain, shortness of breath. B-calves soft, supple. Negative homans sign. VSS. No other complaints.       Past Medical History:  Diagnosis Date  . Anemia    h/o  . Cancer ([place])    melanoma x 2 the last one was of her leg and surgeon removed it  . Colon polyp   . Coronary artery calcification   . Coronary artery disease   . DDD (degenerative disc disease), lumbar   . Diverticulosis 01/20/2017  . Dysphagia   . Dyspnea    with exertion-unable to walk a mile without getting sob- dr sparks set pt up to see Dr Rockey Situ after 11-19-16 surgery  . Environmental allergies   . Esophageal dysmotility   . Fibrocystic breast disease   . GERD (gastroesophageal reflux disease)   . Heart murmur    h/o   . Hyperlipidemia    unspecified  . Hypertension   . Mitral regurgitation   . Monilial esophagitis ([place]) 01/20/2017  . Obstipation   . Osteopenia   . Peptic ulcer disease   . Pulmonary fibrosis ([place])    per Dr Raul Del  . RA (rheumatoid arthritis) ([employer])   . Raynaud's disease   . Redundant colon 01/20/2017  . Scleroderma ([place])   . Telangiectasia of colon   . Tubular adenoma of colon 01/20/2017   unspecified  . Vulvar dysplasia    Past Surgical History:  Procedure Laterality Date  . ABDOMINAL HYSTERECTOMY    . BREAST CYST EXCISION Left 20+ years ago   No scar visible  . BREAST LUMPECTOMY Right   . CARPAL TUNNEL RELEASE Bilateral   . COLON SURGERY     colon polyp   . COLONOSCOPY  09/30/2011   tubular adenoma rtm  . COLONOSCOPY     05/02/2003, 04/10/2000  . COLONOSCOPY WITH PROPOFOL N/A 01/20/2017   Procedure:  COLONOSCOPY WITH PROPOFOL;  Surgeon: Lollie Sails, MD;  Location: [facility] ENDOSCOPY;  Service: Endoscopy;  Laterality: N/A;  . ESOPHAGOGASTRODUODENOSCOPY    . ESOPHAGOGASTRODUODENOSCOPY     09/30/2011, 04/10/2000 , no repeat rtm  . ESOPHAGOGASTRODUODENOSCOPY N/A 03/02/2018   Procedure: ESOPHAGOGASTRODUODENOSCOPY (EGD);  Surgeon: Virgel Manifold, MD;  Location: [facility] ENDOSCOPY;  Service: Endoscopy;  Laterality: N/A;  . ESOPHAGOGASTRODUODENOSCOPY (EGD) WITH PROPOFOL N/A 01/20/2017   Procedure: ESOPHAGOGASTRODUODENOSCOPY (EGD) WITH PROPOFOL;  Surgeon: Lollie Sails, MD;  Location: [facility] ENDOSCOPY;  Service: Endoscopy;  Laterality: N/A;  . EXCISION HYDRADENITIS LABIA Left 11/19/2016   Procedure: EXCISION LABIAL MASS;  Surgeon: Robert Bellow, MD;  Location: [facility] ORS;  Service: General;  Laterality: Left;  . EYE SURGERY Bilateral    cataracts  . HEEL SPUR EXCISION    . HIP ARTHROPLASTY Left 02/23/2018   Procedure: ARTHROPLASTY BIPOLAR HIP (HEMIARTHROPLASTY);  Surgeon: Thornton Park, MD;  Location: [facility] ORS;  Service: Orthopedics;  Laterality: Left;  Marland Kitchen MASS EXCISION Left 11/19/2016   Procedure: EXCISION LEFT THIGH MELANOMA;  Surgeon: Robert Bellow, MD;  Location: [facility] ORS;  Service: General;  Laterality: Left;  . SENTINEL NODE BIOPSY Left 11/19/2016   Procedure: INGUINAL SENTINEL NODE BIOPSY;  Surgeon: Robert Bellow, MD;  Location: [facility] ORS;  Service: General;  Laterality: Left;  . UPPER ESOPHAGEAL ENDOSCOPIC ULTRASOUND (EUS) N/A 01/29/2017   Procedure: UPPER ESOPHAGEAL ENDOSCOPIC ULTRASOUND (EUS);  Surgeon: Holly Bodily, MD;  Location: [facility] ENDOSCOPY;  Service: Endoscopy;  Laterality: N/A;    Allergies  Allergen Reactions  . Remicade [Infliximab] Shortness Of Breath and Itching  . Sulfa Antibiotics Other (See Comments)    Other Reaction: tounge cracked  . Actonel [Risedronate Sodium] Other (See Comments)  . Fosamax [Alendronate Sodium]     GI Bleed   . Procardia [Nifedipine] Hives  . Sulfur Swelling  . Wellbutrin [Bupropion] Anxiety    Allergies as of 03/16/2018      Reactions   Remicade [infliximab] Shortness Of Breath, Itching   Sulfa Antibiotics Other (See Comments)   Other Reaction: tounge cracked   Actonel [risedronate Sodium] Other (See Comments)   Fosamax [alendronate Sodium]    GI Bleed   Procardia [nifedipine] Hives   Sulfur Swelling   Wellbutrin [bupropion] Anxiety      Medication List        Accurate as of 03/16/18  2:52 PM. Always use your most recent med list.          ALPRAZolam 0.5 MG  tablet Commonly known as:  XANAX Take 1 tablet (0.5 mg total) by mouth 4 (four) times daily.   amLODipine 10 MG tablet Commonly known as:  NORVASC Take 5 mg by mouth 2 (two) times daily.   aspirin EC 81 MG tablet Take 81 mg by mouth at bedtime.   cetirizine 10 MG tablet Commonly known as:  ZYRTEC Take 10 mg by mouth daily as needed for allergies.   dexlansoprazole 60 MG capsule Commonly known as:  DEXILANT Take 1 capsule (60 mg total) by mouth daily.   ezetimibe 10 MG tablet Commonly known as:  ZETIA TAKE ONE TABLET BY MOUTH EVERY DAY   ferrous sulfate 325 (65 FE) MG tablet Take 1 tablet (325 mg total) by mouth 3 (three) times daily after meals.   Fish Oil 1000 MG Caps Take 1 capsule by mouth 2 (two) times daily.   FLUoxetine 20 MG capsule Commonly known as:  PROZAC Take 20 mg by mouth daily.  furosemide 20 MG tablet Commonly known as:  LASIX Take 69ms daily as needed for swelling   gabapentin 300 MG capsule Commonly known as:  NEURONTIN Take 300 mg by mouth 3 (three) times daily.   guaifenesin 100 MG/5ML syrup Commonly known as:  ROBITUSSIN Take 200 mg by mouth every 4 (four) hours as needed for cough.   hydroxychloroquine 200 MG tablet Commonly known as:  PLAQUENIL Take 200 mg by mouth 2 (two) times daily.   imipramine 25 MG tablet Commonly known as:  TOFRANIL Take 25 mg by mouth 2 (two) times daily.   ipratropium-albuterol 0.5-2.5 (3) MG/3ML Soln Commonly known as:  DUONEB Take 3 mLs by nebulization every 4 (four) hours as needed (wheezing, dyspnea).   latanoprost 0.005 % ophthalmic solution Commonly known as:  XALATAN Place 1 drop into both eyes at bedtime.   MULTI-VITAMINS Tabs Take 1 tablet by mouth daily.   oxyCODONE 5 MG immediate release tablet Commonly known as:  Oxy IR/ROXICODONE Take 1 tablet (5 mg total) by mouth every 4 (four) hours as needed for moderate pain (pain score 4-6).   polyethylene glycol packet Commonly known as:   MIRALAX / GLYCOLAX Take 17 g by mouth daily.   predniSONE 5 MG tablet Commonly known as:  DELTASONE Take 5 mg by mouth 2 (two) times daily with a meal.   predniSONE 20 MG tablet Commonly known as:  DELTASONE Take 40 mg by mouth daily with breakfast.   simvastatin 80 MG tablet Commonly known as:  ZOCOR Take 832m orally at bedtime   sorbitol 70 % solution Take 30 mLs by mouth every 2 (two) hours as needed (constipation).       Review of Systems  Constitutional: Negative for activity change, appetite change, chills, diaphoresis and fever.  HENT: Negative for congestion, mouth sores, nosebleeds, postnasal drip, sneezing, sore throat, trouble swallowing and voice change.   Respiratory: Negative for apnea, cough, choking, chest tightness, shortness of breath and wheezing.   Cardiovascular: Positive for leg swelling. Negative for chest pain and palpitations.  Gastrointestinal: Negative for abdominal distention, abdominal pain, constipation, diarrhea and nausea.  Genitourinary: Negative for difficulty urinating, dysuria, frequency and urgency.  Musculoskeletal: Positive for arthralgias (typical arthritis) and gait problem. Negative for back pain and myalgias.  Skin: Positive for wound. Negative for color change, pallor and rash.  Neurological: Positive for weakness. Negative for dizziness, tremors, syncope, speech difficulty, numbness and headaches.  Psychiatric/Behavioral: Negative for agitation and behavioral problems.  All other systems reviewed and are negative.   Immunization History  Administered Date(s) Administered  . Influenza Split 09/26/2015  . Influenza-Unspecified 09/15/2012, 09/20/2013, 08/29/2014, 09/26/2015, 09/09/2016, 09/17/2017  . Pneumococcal Polysaccharide-23 08/03/2009   Pertinent  Health Maintenance Due  Topic Date Due  . DEXA SCAN  01/30/2010  . PNA vac Low Risk Adult (1 of 2 - PCV13) 08/03/2010  . INFLUENZA VACCINE  07/08/2018  . MAMMOGRAM  08/20/2019   . COLONOSCOPY  01/20/2027   Fall Risk  06/23/2017  Falls in the past year? Yes  Number falls in past yr: 1  Injury with Fall? Yes  Comment Fractured ribs 9 weeks ago  Risk for fall due to : History of fall(s)  Follow up Education provided;Falls prevention discussed   Functional Status Survey:    Vitals:   03/16/18 1359  BP: 127/85  Pulse: 72  Resp: 18  Temp: 98 F (36.7 C)  TempSrc: Oral  SpO2: 100%  Weight: 210 lb 9.6 oz (95.5 kg)  Height: 5'  2" (1.575 m)   Body mass index is 38.52 kg/m. Physical Exam  Constitutional: She is oriented to person, place, and time. Vital signs are normal. She appears well-developed and well-nourished. She is active and cooperative. She does not appear ill. No distress.  HENT:  Head: Normocephalic and atraumatic.  Mouth/Throat: Uvula is midline, oropharynx is clear and moist and mucous membranes are normal. Mucous membranes are not pale, not dry and not cyanotic.  Eyes: Pupils are equal, round, and reactive to light. Conjunctivae, EOM and lids are normal.  Neck: Trachea normal, normal range of motion and full passive range of motion without pain. Neck supple. No JVD present. No tracheal deviation, no edema and no erythema present. No thyromegaly present.  Cardiovascular: Normal rate, regular rhythm, normal heart sounds, intact distal pulses and normal pulses. Exam reveals no gallop, no distant heart sounds and no friction rub.  No murmur heard. Pulses:      Dorsalis pedis pulses are 2+ on the right side, and 2+ on the left side.  Generalized edema  Pulmonary/Chest: Effort normal and breath sounds normal. No accessory muscle usage. No respiratory distress. She has no decreased breath sounds. She has no wheezes. She has no rhonchi. She has no rales. She exhibits no tenderness.  Abdominal: Soft. Normal appearance and bowel sounds are normal. She exhibits no distension and no ascites. There is no tenderness.  Musculoskeletal: She exhibits no edema.        Left hip: She exhibits decreased range of motion, decreased strength, tenderness, swelling and laceration.  Expected osteoarthritis, stiffness; Bilateral Calves soft, supple. Negative Homan's Sign. B- pedal pulses equal  Neurological: She is alert and oriented to person, place, and time. She has normal strength. Coordination abnormal.  Skin: Skin is warm and dry. Laceration (left hip) noted. She is not diaphoretic. No cyanosis. No pallor. Nails show no clubbing.  Incision well approximated. No redness, no warmth. Scant serous drainage. Generalized edema  Psychiatric: She has a normal mood and affect. Her speech is normal and behavior is normal. Judgment and thought content normal. Cognition and memory are normal.  Nursing note and vitals reviewed.   Labs reviewed: Recent Labs    02/28/18 2031 03/01/18 0830 03/02/18 0435  NA 134* 135 133*  K 5.0 5.0 4.4  CL 101 101 98*  CO2 22 25 25   GLUCOSE 158* 82 101*  BUN 38* 33* 38*  CREATININE 0.82 0.75 1.11*  CALCIUM 8.0* 8.4* 8.6*   Recent Labs    02/26/18 1312 02/28/18 2031 03/01/18 0830  AST 42* 102* 66*  ALT 27 109* 95*  ALKPHOS 105 128* 126  BILITOT 0.6 0.3 0.7  PROT 6.2* 6.1* 6.1*  ALBUMIN 2.4* 2.3* 2.5*   Recent Labs    02/28/18 0455 02/28/18 2031 03/01/18 0830 03/02/18 0435  WBC 16.9* 18.5* 20.8* 19.6*  NEUTROABS 14.6* 16.9*  --  15.8*  HGB 6.0* 6.4* 10.0* 10.8*  HCT 19.2* 20.2* 30.5* 33.4*  MCV 83.2 83.0 85.8 85.0  PLT 279 345 343 402   No results found for: TSH No results found for: HGBA1C No results found for: CHOL, HDL, LDLCALC, LDLDIRECT, TRIG, CHOLHDL  Significant Diagnostic Results in last 30 days:  Ct Angio Chest Pe W Or Wo Contrast  Result Date: 02/26/2018 CLINICAL DATA:  Wheezing and shortness of breath since left hip surgery on February 22, 2018. EXAM: CT ANGIOGRAPHY CHEST WITH CONTRAST TECHNIQUE: Multidetector CT imaging of the chest was performed using the standard protocol during bolus  administration  of intravenous contrast. Multiplanar CT image reconstructions and MIPs were obtained to evaluate the vascular anatomy. CONTRAST:  33m ISOVUE-370 IOPAMIDOL (ISOVUE-370) INJECTION 76% COMPARISON:  Chest CT 10/01/2015 FINDINGS: Cardiovascular: Satisfactory opacification of the pulmonary arteries to the segmental level. No evidence of pulmonary embolism. Mildly enlarged heart. Calcific atherosclerotic disease of the coronary arteries. No pericardial effusion. Mediastinum/Nodes: No enlarged mediastinal, hilar, or axillary lymph nodes. Thyroid gland, and trachea demonstrate no significant findings. Again seen is markedly enlarged esophagus filled with gastric contents nearly to the level of the thoracic inlet. Lungs/Pleura: Mild pulmonary fibrosis. Streaky ground-glass airspace opacities in peribronchial distribution in bilateral lower lobes Upper Abdomen: Benign-appearing right liver cyst.  Otherwise normal. Musculoskeletal: No chest wall abnormality. No acute or significant osseous findings. Review of the MIP images confirms the above findings. IMPRESSION: No evidence of pulmonary embolus. Calcific atherosclerotic disease of the coronary arteries and mildly enlarged heart. Markedly dilated esophagus presumably filled with gastric contents. Although considered less likely, coexisting esophageal mass cannot be excluded on the basis of today's exam. Streaky ground-glass peribronchial opacities in bilateral lower lobes in dependent distribution, which likely represent aspiration pneumonitis. Mild chronic interstitial lung changes. Electronically Signed   By: DFidela SalisburyM.D.   On: 02/26/2018 13:18   Dg Chest Port 1 View  Result Date: 02/22/2018 CLINICAL DATA:  [place] steps.  Left hip pain EXAM: PORTABLE CHEST 1 VIEW COMPARISON:  06/02/2017 FINDINGS: Bibasilar airspace disease unchanged, probable scarring. There may also be a component of atelectasis. Negative for heart failure or pneumonia.   No interval change IMPRESSION: Bibasilar airspace disease unchanged and appearing to represent chronic scarring. No acute abnormality. Electronically Signed   By: CFranchot GalloM.D.   On: 02/22/2018 16:37   Dg Hip Unilat With Pelvis 1v Left  Result Date: 03/02/2018 CLINICAL DATA:  Pain EXAM: DG HIP (WITH OR WITHOUT PELVIS) 2V*L* COMPARISON:  February 23, 2018 FINDINGS: Frontal pelvis as well as lateral left hip images were obtained. There is a total hip replacement on the left with prosthetic components well-seated. No acute fracture or dislocation. Right hip joint appears unremarkable. Sacroiliac joints appear unremarkable. IMPRESSION: Total hip replacement on the left prosthetic components well-seated. No acute fracture or dislocation. No appreciable arthropathic change. Electronically Signed   By: WLowella GripIII M.D.   On: 03/02/2018 09:14   Dg Hip Port Unilat With Pelvis 1v Left  Result Date: 02/23/2018 CLINICAL DATA:  72year old female with left subcapital femur fracture. Subsequent encounter. EXAM: DG HIP (WITH OR WITHOUT PELVIS) 1V PORT LEFT COMPARISON:  02/22/2018 x-ray. FINDINGS: Post total left hip replacement which appears in satisfactory position without complication noted. Vascular groove proximal to mid femoral shaft similar to preoperative exam. IMPRESSION: Post left hip replacement. Electronically Signed   By: SGenia DelM.D.   On: 02/23/2018 15:05   Dg Hip Unilat With Pelvis 2-3 Views Left  Result Date: 02/22/2018 CLINICAL DATA:  Fall.  Hip pain EXAM: DG HIP (WITH OR WITHOUT PELVIS) 2-3V LEFT COMPARISON:  None. FINDINGS: Subcapital femur fracture on the left with mild displacement and impaction. Left hip joint normal. IMPRESSION: Subcapital fracture left femur Electronically Signed   By: CFranchot GalloM.D.   On: 02/22/2018 16:33    Assessment/Plan LBarbeewas seen today for medical management of chronic issues.  Diagnoses and all orders for this visit:  Closed fracture of  left hip, initial encounter ([place]  S/P hip hemiarthroplasty  Generalized edema  Protein-calorie malnutrition, unspecified severity ([place]   Continue  PT/OT  Continue exercises as taught by PT/OT  Continue ice to the Left Hip QID and prn for pain and edema  TED Hose daily  Elevated legs when at rest  Continue Oxycodone 5 mg po Q 4 hours prn pain  Continue Tylenol 650 mg po QID  Aspirin 325 mg po BID for DVT prophylaxis  Ensure Enlive 1 bottle po BID  Pro-stat 30 mL po BID  Continue daily MVI  Continue Magic Cup po BID with meals   Lasix 40 mg po Q Day  Potassium Chloride 20 meq po Q Day  Skin care per protocol  Follow up with Orthopedist as instructed  Family/ staff Communication:   Total Time:  Documentation:  Face to Face:  Family/Phone:   Labs/tests ordered: cbc, met c in am  Medication list reviewed and assessed for continued appropriateness. Monthly medication orders reviewed and signed.  Vikki Ports, NP-C Geriatrics [facility] [phone], [place] [address] Cell Phone (Mon-Fri 8am-5pm):  [phone] On Call:  [phone] & follow prompts after 5pm & weekends Office Phone:  [phone] Office Fax:  [phone]

## 2018-03-16 NOTE — Progress Notes (Deleted)
Location:   [facility] Room Number: 202B Place of Service:  SNF [phone]) Provider:  Toni Arthurs, NP-C  [place]parks, [name], MD  Patient Care Team: Idelle Crouch, MD as PCP - General (Internal Medicine) Bary Castilla Forest Gleason, MD (General Surgery) Jannet Mantis, MD (Dermatology) Minna Merritts, MD as Consulting Physician (Cardiology) Clent Jacks, RN as Registered Nurse  Extended Emergency Contact Information Primary Emergency Contact: Maryclare Bean [place] Phone: [phone] Relation: Relative Secondary Emergency Contact: Herbie Drape [place] Phone: [phone] Relation: Son  Code Status:  FULL Goals of care: Advanced Directive information Advanced Directives 03/16/2018  Does Patient Have a Medical Advance Directive? Yes  Type of Paramedic of [place];Living will  Does patient want to make changes to medical advance directive? No - Patient declined  Copy of [place] in Chart? Yes  Would patient like information on creating a medical advance directive? -     Chief Complaint  Patient presents with  . Medical Management of Chronic Issues    Routine Visit    HPI:  Pt is a 72 y.o. female seen today for medical management of chronic diseases.    No problem-specific Assessment & Plan notes found for this encounter.    Past Medical History:  Diagnosis Date  . Anemia    h/o  . Cancer ([place])    melanoma x 2 the last one was of her leg and surgeon removed it  . Colon polyp   . Coronary artery calcification   . Coronary artery disease   . DDD (degenerative disc disease), lumbar   . Diverticulosis 01/20/2017  . Dysphagia   . Dyspnea    with exertion-unable to walk a mile without getting sob- dr sparks set pt up to see Dr Rockey Situ after 11-19-16 surgery  . Environmental allergies   . Esophageal dysmotility   . Fibrocystic breast disease   .  GERD (gastroesophageal reflux disease)   . Heart murmur    h/o   . Hyperlipidemia    unspecified  . Hypertension   . Mitral regurgitation   . Monilial esophagitis ([place]) 01/20/2017  . Obstipation   . Osteopenia   . Peptic ulcer disease   . Pulmonary fibrosis ([place])    per Dr Raul Del  . RA (rheumatoid arthritis) ([place])   . Raynaud's disease   . Redundant colon 01/20/2017  . Scleroderma ([place])   . Telangiectasia of colon   . Tubular adenoma of colon 01/20/2017   unspecified  . Vulvar dysplasia    Past Surgical History:  Procedure Laterality Date  . ABDOMINAL HYSTERECTOMY    . BREAST CYST EXCISION Left 20+ years ago   No scar visible  . BREAST LUMPECTOMY Right   . CARPAL TUNNEL RELEASE Bilateral   . COLON SURGERY     colon polyp   . COLONOSCOPY  09/30/2011   tubular adenoma rtm  . COLONOSCOPY     05/02/2003, 04/10/2000  . COLONOSCOPY WITH PROPOFOL N/A 01/20/2017   Procedure: COLONOSCOPY WITH PROPOFOL;  Surgeon: Lollie Sails, MD;  Location: [facility] ENDOSCOPY;  Service: Endoscopy;  Laterality: N/A;  . ESOPHAGOGASTRODUODENOSCOPY    . ESOPHAGOGASTRODUODENOSCOPY     09/30/2011, 04/10/2000 , no repeat rtm  . ESOPHAGOGASTRODUODENOSCOPY N/A 03/02/2018   Procedure: ESOPHAGOGASTRODUODENOSCOPY (EGD);  Surgeon: Virgel Manifold, MD;  Location: [facility] ENDOSCOPY;  Service: Endoscopy;  Laterality: N/A;  . ESOPHAGOGASTRODUODENOSCOPY (EGD) WITH PROPOFOL N/A 01/20/2017  Procedure: ESOPHAGOGASTRODUODENOSCOPY (EGD) WITH PROPOFOL;  Surgeon: Lollie Sails, MD;  Location: [facility] ENDOSCOPY;  Service: Endoscopy;  Laterality: N/A;  . EXCISION HYDRADENITIS LABIA Left 11/19/2016   Procedure: EXCISION LABIAL MASS;  Surgeon: Robert Bellow, MD;  Location: [facility] ORS;  Service: General;  Laterality: Left;  . EYE SURGERY Bilateral    cataracts  . HEEL SPUR EXCISION    . HIP ARTHROPLASTY Left 02/23/2018   Procedure: ARTHROPLASTY BIPOLAR HIP (HEMIARTHROPLASTY);  Surgeon: Thornton Park, MD;  Location: [facility]  ORS;  Service: Orthopedics;  Laterality: Left;  Marland Kitchen MASS EXCISION Left 11/19/2016   Procedure: EXCISION LEFT THIGH MELANOMA;  Surgeon: Robert Bellow, MD;  Location: [facility] ORS;  Service: General;  Laterality: Left;  . SENTINEL NODE BIOPSY Left 11/19/2016   Procedure: INGUINAL SENTINEL NODE BIOPSY;  Surgeon: Robert Bellow, MD;  Location: [facility] ORS;  Service: General;  Laterality: Left;  . UPPER ESOPHAGEAL ENDOSCOPIC ULTRASOUND (EUS) N/A 01/29/2017   Procedure: UPPER ESOPHAGEAL ENDOSCOPIC ULTRASOUND (EUS);  Surgeon: Holly Bodily, MD;  Location[facility] ENDOSCOPY;  Service: Endoscopy;  Laterality: N/A;    Allergies  Allergen Reactions  . Remicade [Infliximab] Shortness Of Breath and Itching  . Sulfa Antibiotics Other (See Comments)    Other Reaction: tounge cracked  . Actonel [Risedronate Sodium] Other (See Comments)  . Fosamax [Alendronate Sodium]     GI Bleed   . Procardia [Nifedipine] Hives  . Sulfur Swelling  . Wellbutrin [Bupropion] Anxiety    Allergies as of 03/16/2018      Reactions   Remicade [infliximab] Shortness Of Breath, Itching   Sulfa Antibiotics Other (See Comments)   Other Reaction: tounge cracked   Actonel [risedronate Sodium] Other (See Comments)   Fosamax [alendronate Sodium]    GI Bleed   Procardia [nifedipine] Hives   Sulfur Swelling   Wellbutrin [bupropion] Anxiety      Medication List        Accurate as of 03/16/18  3:09 PM. Always use your most recent med list.          acetaminophen 325 MG tablet Commonly known as:  TYLENOL Take 650 mg by mouth 4 (four) times daily.   ALPRAZolam 0.5 MG tablet Commonly known as:  XANAX Take 1 tablet (0.5 mg total) by mouth 4 (four) times daily.   amLODipine 10 MG tablet Commonly known as:  NORVASC Take 5 mg by mouth 2 (two) times daily.   aspirin 325 MG EC tablet Take 325 mg by mouth 2 (two) times daily.   bisacodyl 10 MG suppository Commonly known as:  DULCOLAX Place 10 mg rectally daily as  needed.   cetirizine 10 MG tablet Commonly known as:  ZYRTEC Take 10 mg by mouth daily as needed for allergies.   dexlansoprazole 60 MG capsule Commonly known as:  DEXILANT Take 1 capsule (60 mg total) by mouth daily.   ENSURE ENLIVE PO Take 1 Bottle by mouth 2 (two) times daily between meals.   ezetimibe 10 MG tablet Commonly known as:  ZETIA TAKE ONE TABLET BY MOUTH EVERY DAY   feeding supplement (PRO-STAT SUGAR FREE 64) Liqd Take 30 mLs by mouth 2 (two) times daily between meals.   ferrous sulfate 325 (65 FE) MG tablet Take 1 tablet (325 mg total) by mouth 3 (three) times daily after meals.   Fish Oil 1000 MG Caps Take 1 capsule by mouth 2 (two) times daily.   FLUoxetine 20 MG capsule Commonly known as:  PROZAC Take 20 mg by mouth  daily.   furosemide 20 MG tablet Commonly known as:  LASIX Take 20mg s daily as needed for swelling   gabapentin 300 MG capsule Commonly known as:  NEURONTIN Take 300 mg by mouth 3 (three) times daily.   guaifenesin 100 MG/5ML syrup Commonly known as:  ROBITUSSIN Take 200 mg by mouth every 4 (four) hours as needed for cough.   hydroxychloroquine 200 MG tablet Commonly known as:  PLAQUENIL Take 200 mg by mouth 2 (two) times daily.   ipratropium-albuterol 0.5-2.5 (3) MG/3ML Soln Commonly known as:  DUONEB Take 3 mLs by nebulization every 4 (four) hours as needed (wheezing, dyspnea).   latanoprost 0.005 % ophthalmic solution Commonly known as:  XALATAN Place 1 drop into both eyes at bedtime.   magnesium hydroxide 400 MG/5ML suspension Commonly known as:  MILK OF MAGNESIA Take 30 mLs by mouth every 4 (four) hours as needed. Constipation/ no BM for 2 days   MULTI-VITAMINS Tabs Take 1 tablet by mouth daily.   oxyCODONE 5 MG immediate release tablet Commonly known as:  Oxy IR/ROXICODONE Take 1 tablet (5 mg total) by mouth every 4 (four) hours as needed for moderate pain (pain score 4-6).   polyethylene glycol packet Commonly  known as:  MIRALAX / GLYCOLAX Take 17 g by mouth daily.   predniSONE 5 MG tablet Commonly known as:  DELTASONE Take 5 mg by mouth 2 (two) times daily with a meal.   predniSONE 20 MG tablet Commonly known as:  DELTASONE Take 20 mg by mouth daily with breakfast. Give with lasix for generalized edema   simvastatin 20 MG tablet Commonly known as:  ZOCOR Take 20 mg by mouth at bedtime.   sorbitol 70 % solution Take 30 mLs by mouth every 2 (two) hours as needed (constipation). hours until large BM. Assess BM for GI bleed. hgb low- constipation   zolpidem 5 MG tablet Commonly known as:  AMBIEN Take 5 mg by mouth at bedtime as needed.       Review of Systems  Immunization History  Administered Date(s) Administered  . Influenza Split 09/26/2015  . Influenza-Unspecified 09/15/2012, 09/20/2013, 08/29/2014, 09/26/2015, 09/09/2016, 09/17/2017  . Pneumococcal Polysaccharide-23 08/03/2009   Pertinent  Health Maintenance Due  Topic Date Due  . DEXA SCAN  01/30/2010  . PNA vac Low Risk Adult (1 of 2 - PCV13) 08/03/2010  . INFLUENZA VACCINE  07/08/2018  . MAMMOGRAM  08/20/2019  . COLONOSCOPY  01/20/2027   Fall Risk  06/23/2017  Falls in the past year? Yes  Number falls in past yr: 1  Injury with Fall? Yes  Comment Fractured ribs 9 weeks ago  Risk for fall due to : History of fall(s)  Follow up Education provided;Falls prevention discussed   Functional Status Survey:    Vitals:   03/16/18 1359  BP: 127/85  Pulse: 72  Resp: 18  Temp: 98 F (36.7 C)  TempSrc: Oral  SpO2: 100%  Weight: 210 lb 9.6 oz (95.5 kg)  Height: 5\' 2"  (1.575 m)   Body mass index is 38.52 kg/m. Physical Exam  Labs reviewed: Recent Labs    02/28/18 2031 03/01/18 0830 03/02/18 0435  NA 134* 135 133*  K 5.0 5.0 4.4  CL 101 101 98*  CO2 22 25 25   GLUCOSE 158* 82 101*  BUN 38* 33* 38*  CREATININE 0.82 0.75 1.11*  CALCIUM 8.0* 8.4* 8.6*   Recent Labs    02/26/18 1312 02/28/18 2031  03/01/18 0830  AST 42* 102* 66*  ALT 27 109* 95*  ALKPHOS 105 128* 126  BILITOT 0.6 0.3 0.7  PROT 6.2* 6.1* 6.1*  ALBUMIN 2.4* 2.3* 2.5*   Recent Labs    02/28/18 0455 02/28/18 2031 03/01/18 0830 03/02/18 0435  WBC 16.9* 18.5* 20.8* 19.6*  NEUTROABS 14.6* 16.9*  --  15.8*  HGB 6.0* 6.4* 10.0* 10.8*  HCT 19.2* 20.2* 30.5* 33.4*  MCV 83.2 83.0 85.8 85.0  PLT 279 345 343 402   No results found for: TSH No results found for: HGBA1C No results found for: CHOL, HDL, LDLCALC, LDLDIRECT, TRIG, CHOLHDL  Significant Diagnostic Results in last 30 days:  Ct Angio Chest Pe W Or Wo Contrast  Result Date: 02/26/2018 CLINICAL DATA:  Wheezing and shortness of breath since left hip surgery on February 22, 2018. EXAM: CT ANGIOGRAPHY CHEST WITH CONTRAST TECHNIQUE: Multidetector CT imaging of the chest was performed using the standard protocol during bolus administration of intravenous contrast. Multiplanar CT image reconstructions and MIPs were obtained to evaluate the vascular anatomy. CONTRAST:  52mL ISOVUE-370 IOPAMIDOL (ISOVUE-370) INJECTION 76% COMPARISON:  Chest CT 10/01/2015 FINDINGS: Cardiovascular: Satisfactory opacification of the pulmonary arteries to the segmental level. No evidence of pulmonary embolism. Mildly enlarged heart. Calcific atherosclerotic disease of the coronary arteries. No pericardial effusion. Mediastinum/Nodes: No enlarged mediastinal, hilar, or axillary lymph nodes. Thyroid gland, and trachea demonstrate no significant findings. Again seen is markedly enlarged esophagus filled with gastric contents nearly to the level of the thoracic inlet. Lungs/Pleura: Mild pulmonary fibrosis. Streaky ground-glass airspace opacities in peribronchial distribution in bilateral lower lobes Upper Abdomen: Benign-appearing right liver cyst.  Otherwise normal. Musculoskeletal: No chest wall abnormality. No acute or significant osseous findings. Review of the MIP images confirms the above findings.  IMPRESSION: No evidence of pulmonary embolus. Calcific atherosclerotic disease of the coronary arteries and mildly enlarged heart. Markedly dilated esophagus presumably filled with gastric contents. Although considered less likely, coexisting esophageal mass cannot be excluded on the basis of today's exam. Streaky ground-glass peribronchial opacities in bilateral lower lobes in dependent distribution, which likely represent aspiration pneumonitis. Mild chronic interstitial lung changes. Electronically Signed   By: Fidela Salisbury M.D.   On: 02/26/2018 13:18   Dg Chest Port 1 View  Result Date: 02/22/2018 CLINICAL DATA:  [place] down steps.  Left hip pain EXAM: PORTABLE CHEST 1 VIEW COMPARISON:  06/02/2017 FINDINGS: Bibasilar airspace disease unchanged, probable scarring. There may also be a component of atelectasis. Negative for heart failure or pneumonia.  No interval change IMPRESSION: Bibasilar airspace disease unchanged and appearing to represent chronic scarring. No acute abnormality. Electronically Signed   By: Franchot Gallo M.D.   On: 02/22/2018 16:37   Dg Hip Unilat With Pelvis 1v Left  Result Date: 03/02/2018 CLINICAL DATA:  Pain EXAM: DG HIP (WITH OR WITHOUT PELVIS) 2V*L* COMPARISON:  February 23, 2018 FINDINGS: Frontal pelvis as well as lateral left hip images were obtained. There is a total hip replacement on the left with prosthetic components well-seated. No acute fracture or dislocation. Right hip joint appears unremarkable. Sacroiliac joints appear unremarkable. IMPRESSION: Total hip replacement on the left prosthetic components well-seated. No acute fracture or dislocation. No appreciable arthropathic change. Electronically Signed   By: Lowella Grip III M.D.   On: 03/02/2018 09:14   Dg Hip Port Unilat With Pelvis 1v Left  Result Date: 02/23/2018 CLINICAL DATA:  72 year old female with left subcapital femur fracture. Subsequent encounter. EXAM: DG HIP (WITH OR WITHOUT PELVIS) 1V PORT  LEFT COMPARISON:  02/22/2018 x-ray. FINDINGS:  Post total left hip replacement which appears in satisfactory position without complication noted. Vascular groove proximal to mid femoral shaft similar to preoperative exam. IMPRESSION: Post left hip replacement. Electronically Signed   By: Genia Del M.D.   On: 02/23/2018 15:05   Dg Hip Unilat With Pelvis 2-3 Views Left  Result Date: 02/22/2018 CLINICAL DATA:  Fall.  Hip pain EXAM: DG HIP (WITH OR WITHOUT PELVIS) 2-3V LEFT COMPARISON:  None. FINDINGS: Subcapital femur fracture on the left with mild displacement and impaction. Left hip joint normal. IMPRESSION: Subcapital fracture left femur Electronically Signed   By: Franchot Gallo M.D.   On: 02/22/2018 16:33    Assessment/Plan Jyra was seen today for medical management of chronic issues.  Diagnoses and all orders for this visit:  Closed fracture of left hip, initial encounter ([place])  S/P hip hemiarthroplasty  Generalized edema  Protein-calorie malnutrition, unspecified severity ([place])    Family/ staff Communication: ***  Total Time:  Documentation:  Face to Face:  Family/Phone:   Labs/tests ordered:  ***  Medication list reviewed and assessed for continued appropriateness. Monthly medication orders reviewed and signed.  Vikki Ports, NP-C Geriatrics [facility] [phone] [place] [address] Cell Phone (Mon-Fri 8am-5pm):  [phone] On Call:  [phone] & follow prompts after 5pm & weekends Office Phone:  [phone] Office Fax:  [phone]

## 2018-03-17 ENCOUNTER — Other Ambulatory Visit
Admission: RE | Admit: 2018-03-17 | Discharge: 2018-03-17 | Disposition: A | Discharge status: Home / Self Care | Payer: Medicare Other | Source: Ambulatory Visit | Attending: Gerontology | Admitting: Gerontology

## 2018-03-17 ENCOUNTER — Other Ambulatory Visit: Payer: Self-pay

## 2018-03-17 DIAGNOSIS — D62 Acute posthemorrhagic anemia: Secondary | ICD-10-CM | POA: Diagnosis present

## 2018-03-17 LAB — COMPREHENSIVE METABOLIC PANEL
ALBUMIN: 3.2 g/dL — AB (ref 3.5–5.0)
ALT: 37 U/L (ref 14–54)
ANION GAP: 11 (ref 5–15)
AST: 41 U/L (ref 15–41)
Alkaline Phosphatase: 188 U/L — ABNORMAL HIGH (ref 38–126)
BILIRUBIN TOTAL: 0.4 mg/dL (ref 0.3–1.2)
BUN: 15 mg/dL (ref 6–20)
CO2: 26 mmol/L (ref 22–32)
Calcium: 8.7 mg/dL — ABNORMAL LOW (ref 8.9–10.3)
Chloride: 99 mmol/L — ABNORMAL LOW (ref 101–111)
Creatinine, Ser: 0.93 mg/dL (ref 0.44–1.00)
GFR calc Af Amer: >60 mL/min (ref >60)
GFR, EST NON AFRICAN AMERICAN: 60 mL/min — AB (ref >60)
Glucose, Bld: 140 mg/dL — ABNORMAL HIGH (ref 65–99)
Potassium: 5.9 mmol/L — ABNORMAL HIGH (ref 3.5–5.1)
Sodium: 136 mmol/L (ref 135–145)
TOTAL PROTEIN: 6.4 g/dL — AB (ref 6.5–8.1)

## 2018-03-17 LAB — URINALYSIS, COMPLETE (UACMP) WITH MICROSCOPIC
BILIRUBIN URINE: NEGATIVE
GLUCOSE, UA: NEGATIVE mg/dL
HGB URINE DIPSTICK: NEGATIVE
Ketones, ur: NEGATIVE mg/dL
Nitrite: NEGATIVE
PH: 6 (ref 5.0–8.0)
Protein, ur: NEGATIVE mg/dL
SPECIFIC GRAVITY, URINE: 1.008 (ref 1.005–1.030)

## 2018-03-17 LAB — CBC WITH DIFFERENTIAL/PLATELET
BASOS ABS: 0 10*3/uL (ref 0–0.1)
Basophils Relative: 0 %
Eosinophils Absolute: 0.2 10*3/uL (ref 0–0.7)
Eosinophils Relative: 1 %
HEMATOCRIT: 36.8 % (ref 35.0–47.0)
Hemoglobin: 11.7 g/dL — ABNORMAL LOW (ref 12.0–16.0)
LYMPHS ABS: 1.8 10*3/uL (ref 1.0–3.6)
Lymphocytes Relative: 9 %
MCH: 28.8 pg (ref 26.0–34.0)
MCHC: 31.8 g/dL — AB (ref 32.0–36.0)
MCV: 90.4 fL (ref 80.0–100.0)
MONO ABS: 2.2 10*3/uL — AB (ref 0.2–0.9)
Monocytes Relative: 11 %
NEUTROS ABS: 15.8 10*3/uL — AB (ref 1.4–6.5)
Neutrophils Relative %: 79 %
PLATELETS: 492 10*3/uL — AB (ref 150–440)
RBC: 4.06 MIL/uL (ref 3.80–5.20)
RDW: 20.1 % — AB (ref 11.5–14.5)
WBC: 20 10*3/uL — ABNORMAL HIGH (ref 3.6–11.0)

## 2018-03-17 MED ORDER — ZOLPIDEM TARTRATE 5 MG PO TABS: 5.0000 mg | ORAL_TABLET | Freq: Every evening | ORAL | 0 refills | Status: DC | PRN

## 2018-03-17 NOTE — Telephone Encounter (Signed)
Rx sent to [facility] phone : [phone] , fax : [phone]  

## 2018-03-18 ENCOUNTER — Telehealth: Payer: Self-pay

## 2018-03-18 ENCOUNTER — Ambulatory Visit (INDEPENDENT_AMBULATORY_CARE_PROVIDER_SITE_OTHER): Payer: Medicare Other | Admitting: Cardiovascular Disease

## 2018-03-18 ENCOUNTER — Encounter: Payer: Self-pay | Admitting: Cardiovascular Disease

## 2018-03-18 ENCOUNTER — Other Ambulatory Visit
Admission: RE | Admit: 2018-03-18 | Discharge: 2018-03-18 | Disposition: A | Discharge status: Home / Self Care | Payer: Medicare Other | Source: Skilled Nursing Facility | Attending: Internal Medicine | Admitting: Internal Medicine

## 2018-03-18 VITALS — BP 126/73 | HR 84 | Ht 61.0 in | Wt 217.0 lb

## 2018-03-18 DIAGNOSIS — J84112 Idiopathic pulmonary fibrosis: Secondary | ICD-10-CM

## 2018-03-18 DIAGNOSIS — R0602 Shortness of breath: Secondary | ICD-10-CM | POA: Diagnosis not present

## 2018-03-18 DIAGNOSIS — D62 Acute posthemorrhagic anemia: Secondary | ICD-10-CM | POA: Diagnosis present

## 2018-03-18 DIAGNOSIS — R600 Localized edema: Secondary | ICD-10-CM

## 2018-03-18 DIAGNOSIS — I25118 Atherosclerotic heart disease of native coronary artery with other forms of angina pectoris: Secondary | ICD-10-CM

## 2018-03-18 DIAGNOSIS — M349 Systemic sclerosis, unspecified: Secondary | ICD-10-CM

## 2018-03-18 DIAGNOSIS — I7300 Raynaud's syndrome without gangrene: Secondary | ICD-10-CM | POA: Diagnosis not present

## 2018-03-18 DIAGNOSIS — E782 Mixed hyperlipidemia: Secondary | ICD-10-CM | POA: Diagnosis not present

## 2018-03-18 DIAGNOSIS — I5031 Acute diastolic (congestive) heart failure: Secondary | ICD-10-CM | POA: Diagnosis not present

## 2018-03-18 LAB — CBC WITH DIFFERENTIAL/PLATELET
BASOS ABS: 0.2 10*3/uL — AB (ref 0–0.1)
Basophils Relative: 1 %
Eosinophils Absolute: 0.3 10*3/uL (ref 0–0.7)
Eosinophils Relative: 2 %
HCT: 32.5 % — ABNORMAL LOW (ref 35.0–47.0)
Hemoglobin: 10.4 g/dL — ABNORMAL LOW (ref 12.0–16.0)
LYMPHS ABS: 2 10*3/uL (ref 1.0–3.6)
Lymphocytes Relative: 12 %
MCH: 29.1 pg (ref 26.0–34.0)
MCHC: 32 g/dL (ref 32.0–36.0)
MCV: 91 fL (ref 80.0–100.0)
MONOS PCT: 10 %
Monocytes Absolute: 1.6 10*3/uL — ABNORMAL HIGH (ref 0.2–0.9)
NEUTROS PCT: 75 %
Neutro Abs: 12.2 10*3/uL — ABNORMAL HIGH (ref 1.4–6.5)
Platelets: 353 10*3/uL (ref 150–440)
RBC: 3.57 MIL/uL — AB (ref 3.80–5.20)
RDW: 20.7 % — AB (ref 11.5–14.5)
WBC: 16.3 10*3/uL — AB (ref 3.6–11.0)

## 2018-03-18 LAB — BASIC METABOLIC PANEL
ANION GAP: 6 (ref 5–15)
BUN: 16 mg/dL (ref 6–20)
CALCIUM: 8.1 mg/dL — AB (ref 8.9–10.3)
CO2: 31 mmol/L (ref 22–32)
CREATININE: 0.63 mg/dL (ref 0.44–1.00)
Chloride: 100 mmol/L — ABNORMAL LOW (ref 101–111)
GFR calc non Af Amer: >60 mL/min (ref >60)
Glucose, Bld: 78 mg/dL (ref 65–99)
Potassium: 4.9 mmol/L (ref 3.5–5.1)
SODIUM: 137 mmol/L (ref 135–145)

## 2018-03-18 MED ORDER — AMLODIPINE BESYLATE 5 MG PO TABS: 5.0000 mg | ORAL_TABLET | Freq: Every day | ORAL | 3 refills | Status: DC

## 2018-03-18 NOTE — Telephone Encounter (Signed)
-----   Message from Virgel Manifold, MD sent at 03/03/2018  1:35 PM EDT ----- Can you please call [facility] and let this know, this patient of theirs was in the hospital recently (Dr. Gustavo Lah is aware), so they can schedule a hospital follow up in a few weeks with her. Thank You.

## 2018-03-18 NOTE — Telephone Encounter (Signed)
Letter was sent on 03/15/18 to Dr. Gustavo Lah.

## 2018-03-18 NOTE — Progress Notes (Signed)
Cardiology Office Note  Date:  03/18/2018   ID:  Erin [name], DOB 1946/04/05, MRN [id number]  PCP:  Idelle Crouch, MD   Chief Complaint  Patient presents with  . other    C/o sob and weight gain. Meds reviewed verbally with pt.    HPI:  Erin [name] is a very pleasant 72 year old woman with history of  [name], Seen on CT scan chest October 2016 mid to distal LAD region Scleroderma,  raynauds disease (toes, hands), arthritis,  chronic interstitial lung disease at the bases seen on CT scan chest x-ray,  who presents for worsening shortness of breath on exertion  Several recent hospitalizations March 2019 Had fall with left femoral fracture Surgical repair, subsequent anemia Fall was mechanical, down some steps  Readmitted to the hospital several days later with worsening anemia At EGD showing small region of erosion Started on iron,  Blood transfusions for hemoglobin of 6 She reports worsening lower extremity edema Albumin/protein low  Started on Lasix 40 mg daily the past several days Reports slowly feeling stronger Presents in a wheelchair, appears pale Walking short distance with walker  EKG personally reviewed by myself on todays visit Shows normal sinus rhythm with rate 84 bpm no significant ST-T wave changes, left axis deviation, poor R wave progression to the anterior precordial leads  Other past medical history reviewed Stress test 12/19/2016 No ischemia, EF >55%  Echo 03/2017 Normal ejection fraction  Spinal stenosis Disk problems Walking with a cane and walker Was told not a candidate for surgery,  Stopped the morning amlodipine Takes one in the PM  Stopped rehab, had a fall, Broken ribs  No pulmonary issues managed by Dr. Hinda Glatter her shortness of breath is stable  Previous CXR showing chronic interstitial changes are present at the lung bases.  CT scan results and images  mid to distal LAD coronary calcifications No significant thoracic  descending  or ascending aortic atherosclerosis It does show fibrosis at the bases of the lungs bilaterally perhaps worse on the left than the right  Other medical issues include Melanoma left thigh with   PMH:   has a past medical history of Anemia, Cancer ([place]), Colon polyp, Coronary artery calcification, Coronary artery disease, DDD (degenerative disc disease), lumbar, Diverticulosis (01/20/2017), Dysphagia, Dyspnea, Environmental allergies, Esophageal dysmotility, Fibrocystic breast disease, GERD (gastroesophageal reflux disease), Heart murmur, Hyperlipidemia, Hypertension, Mitral regurgitation, Monilial esophagitis ([place]) (01/20/2017), Obstipation, Osteopenia, Peptic ulcer disease, Pulmonary fibrosis ([place]), RA (rheumatoid arthritis) ([place]), Raynaud's disease, Redundant colon (01/20/2017), Scleroderma ([place]), Telangiectasia of colon, Tubular adenoma of colon (01/20/2017), and Vulvar dysplasia.  PSH:    Past Surgical History:  Procedure Laterality Date  . ABDOMINAL HYSTERECTOMY    . BREAST CYST EXCISION Left 20+ years ago   No scar visible  . BREAST LUMPECTOMY Right   . CARPAL TUNNEL RELEASE Bilateral   . COLON SURGERY     colon polyp   . COLONOSCOPY  09/30/2011   tubular adenoma rtm  . COLONOSCOPY     05/02/2003, 04/10/2000  . COLONOSCOPY WITH PROPOFOL N/A 01/20/2017   Procedure: COLONOSCOPY WITH PROPOFOL;  Surgeon: Lollie Sails, MD;  Location: [facility] ENDOSCOPY;  Service: Endoscopy;  Laterality: N/A;  . ESOPHAGOGASTRODUODENOSCOPY    . ESOPHAGOGASTRODUODENOSCOPY     09/30/2011, 04/10/2000 , no repeat rtm  . ESOPHAGOGASTRODUODENOSCOPY N/A 03/02/2018   Procedure: ESOPHAGOGASTRODUODENOSCOPY (EGD);  Surgeon: Virgel Manifold, MD;  Location: [facility] ENDOSCOPY;  Service: Endoscopy;  Laterality: N/A;  . ESOPHAGOGASTRODUODENOSCOPY (EGD) WITH PROPOFOL N/A 01/20/2017  Procedure: ESOPHAGOGASTRODUODENOSCOPY (EGD) WITH PROPOFOL;  Surgeon: Lollie Sails, MD;  Location: [facility] ENDOSCOPY;  Service:  Endoscopy;  Laterality: N/A;  . EXCISION HYDRADENITIS LABIA Left 11/19/2016   Procedure: EXCISION LABIAL MASS;  Surgeon: Robert Bellow, MD;  Location: [facility] ORS;  Service: General;  Laterality: Left;  . EYE SURGERY Bilateral    cataracts  . HEEL SPUR EXCISION    . HIP ARTHROPLASTY Left 02/23/2018   Procedure: ARTHROPLASTY BIPOLAR HIP (HEMIARTHROPLASTY);  Surgeon: Thornton Park, MD;  Location: [facility] ORS;  Service: Orthopedics;  Laterality: Left;  Marland Kitchen MASS EXCISION Left 11/19/2016   Procedure: EXCISION LEFT THIGH MELANOMA;  Surgeon: Robert Bellow, MD;  Location: [facility] ORS;  Service: General;  Laterality: Left;  . SENTINEL NODE BIOPSY Left 11/19/2016   Procedure: INGUINAL SENTINEL NODE BIOPSY;  Surgeon: Robert Bellow, MD;  Location: [facility] ORS;  Service: General;  Laterality: Left;  . UPPER ESOPHAGEAL ENDOSCOPIC ULTRASOUND (EUS) N/A 01/29/2017   Procedure: UPPER ESOPHAGEAL ENDOSCOPIC ULTRASOUND (EUS);  Surgeon: Holly Bodily, MD;  Location: [facility] ENDOSCOPY;  Service: Endoscopy;  Laterality: N/A;    Current Outpatient Medications  Medication Sig Dispense Refill  . acetaminophen (TYLENOL) 325 MG tablet Take 650 mg by mouth 4 (four) times daily.    Marland Kitchen ALPRAZolam (XANAX) 0.5 MG tablet Take 1 tablet (0.5 mg total) by mouth 4 (four) times daily. 120 tablet 0  . Amino Acids-Protein Hydrolys (FEEDING SUPPLEMENT, PRO-STAT SUGAR FREE 64,) LIQD Take 30 mLs by mouth 2 (two) times daily between meals.    Marland Kitchen amLODipine (NORVASC) 10 MG tablet Take 5 mg by mouth 2 (two) times daily.     Marland Kitchen aspirin 325 MG EC tablet Take 325 mg by mouth 2 (two) times daily.    . bisacodyl (DULCOLAX) 10 MG suppository Place 10 mg rectally daily as needed.    . cetirizine (ZYRTEC) 10 MG tablet Take 10 mg by mouth daily as needed for allergies.     Marland Kitchen dexlansoprazole (DEXILANT) 60 MG capsule Take 1 capsule (60 mg total) by mouth daily. 30 capsule 0  . ezetimibe (ZETIA) 10 MG tablet TAKE ONE TABLET BY MOUTH EVERY DAY 30  tablet 11  . ferrous sulfate 325 (65 FE) MG tablet Take 1 tablet (325 mg total) by mouth 3 (three) times daily after meals. 60 tablet 3  . FLUoxetine (PROZAC) 20 MG capsule Take 20 mg by mouth daily.     . furosemide (LASIX) 20 MG tablet Take 20mg s daily as needed for swelling    . gabapentin (NEURONTIN) 300 MG capsule Take 300 mg by mouth 3 (three) times daily.     Marland Kitchen guaifenesin (ROBITUSSIN) 100 MG/5ML syrup Take 200 mg by mouth every 4 (four) hours as needed for cough.    . hydroxychloroquine (PLAQUENIL) 200 MG tablet Take 200 mg by mouth 2 (two) times daily.     Marland Kitchen ipratropium-albuterol (DUONEB) 0.5-2.5 (3) MG/3ML SOLN Take 3 mLs by nebulization every 4 (four) hours as needed (wheezing, dyspnea).    . latanoprost (XALATAN) 0.005 % ophthalmic solution Place 1 drop into both eyes at bedtime.     . magnesium hydroxide (MILK OF MAGNESIA) 400 MG/5ML suspension Take 30 mLs by mouth every 4 (four) hours as needed. Constipation/ no BM for 2 days    . Multiple Vitamin (MULTI-VITAMINS) TABS Take 1 tablet by mouth daily.     . Nutritional Supplements (ENSURE ENLIVE PO) Take 1 Bottle by mouth 2 (two) times daily between meals.    . Omega-3  Fatty Acids (FISH OIL) 1000 MG CAPS Take 1 capsule by mouth 2 (two) times daily.     Marland Kitchen oxyCODONE (OXY IR/ROXICODONE) 5 MG immediate release tablet Take 1 tablet (5 mg total) by mouth every 4 (four) hours as needed for moderate pain (pain score 4-6). 120 tablet 0  . polyethylene glycol (MIRALAX / GLYCOLAX) packet Take 17 g by mouth daily.     . predniSONE (DELTASONE) 5 MG tablet Take 5 mg by mouth 2 (two) times daily with a meal.     . simvastatin (ZOCOR) 20 MG tablet Take 20 mg by mouth at bedtime.    . sorbitol 70 % solution Take 30 mLs by mouth every 2 (two) hours as needed (constipation). hours until large BM. Assess BM for GI bleed. hgb low- constipation    . zolpidem (AMBIEN) 5 MG tablet Take 1 tablet (5 mg total) by mouth at bedtime as needed. 14 tablet 0  .  predniSONE (DELTASONE) 20 MG tablet Take 20 mg by mouth daily with breakfast. Give with lasix for generalized edema     No current facility-administered medications for this visit.      Allergies:   Remicade [infliximab]; Sulfa antibiotics; Actonel [risedronate sodium]; Fosamax [alendronate sodium]; Procardia [nifedipine]; Sulfur; and Wellbutrin [bupropion]   Social History:  The patient  reports that she has never smoked. She has never used smokeless tobacco. She reports that she does not drink alcohol or use drugs.   Family History:   family history includes Bladder Cancer in her father; COPD in her father; Cerebral palsy in her sister; Endometrial cancer (age of onset: 40) in her mother; Hypertension in her mother; Osteoporosis in her mother.    Review of Systems: Review of Systems  Respiratory: Negative.   Cardiovascular: Positive for leg swelling.  Gastrointestinal: Negative.   Musculoskeletal: Positive for falls.       Leg weakness  Neurological: Positive for weakness.  Psychiatric/Behavioral: Negative.   All other systems reviewed and are negative.    PHYSICAL EXAM: VS:  BP 126/73 (BP Location: Left Arm, Patient Position: Sitting, Cuff Size: Large)   Pulse 84   Ht 5\' 1"  (1.549 m)   Wt 217 lb (98.4 kg)   BMI 41.00 kg/m  , BMI Body mass index is 41 kg/m. Constitutional:  oriented to person, place, and time. No distress. Presenting in a wheelchair Appears pale, obese  HENT:  Head: Normocephalic and atraumatic.  Eyes:  no discharge. No scleral icterus.  Neck: Normal range of motion. Neck supple. No JVD present.  Cardiovascular: Normal rate, regular rhythm, normal heart sounds and intact distal pulses. Exam reveals no gallop and no friction rub.  1-2+ pitting edema to the thighs No murmur heard. Pulmonary/Chest: Effort normal and breath sounds normal. No stridor. No respiratory distress.  no wheezes.  no rales.  no tenderness.  Abdominal: Soft.  no distension.  no  tenderness.  Musculoskeletal: Normal range of motion.  no  tenderness or deformity.  Neurological:  normal muscle tone. Coordination normal. No atrophy Skin: Skin is warm and dry. No rash noted. not diaphoretic.  Psychiatric:  normal mood and affect. behavior is normal. Thought content normal.    Recent Labs: 02/26/2018: B Natriuretic Peptide 84.0 03/17/2018: ALT 37 03/18/2018: BUN 16; Creatinine, Ser 0.63; Hemoglobin 10.4; Platelets 353; Potassium 4.9; Sodium 137    Lipid Panel No results found for: CHOL, HDL, LDLCALC, TRIG    Wt Readings from Last 3 Encounters:  03/18/18 217 lb (98.4 kg)  03/16/18 210 lb 9.6 oz (95.5 kg)  03/02/18 221 lb (100.2 kg)      ASSESSMENT AND PLAN:   Shortness of breath Chronic issue, stable Previous normal stress test, echocardiogram with normal ejection fraction Comorbidities include obesity, deconditioning, interstitial fibrosis, likely with diastolic CHF   Idiopathic interstitial fibrosis of lung syndrome ([place]) Followed by pulmonary, Dr. Annye Asa  Scleroderma [facility]) Followed by rheumatology  Raynaud's disease without gangrene Warmer weather coming, we will decrease amlodipine down to 5 mg/day given her leg swelling  [name]  Seen on CT scan chest October 2016 mid to distal LAD region Denies any anginal symptoms  Leg swelling Long discussion concerning recent hospital events, 2 hospitalizations one fall with fracture and surgery, Subsequent anemia requiring transfusion and additional workup  Appears to be doing somewhat better, blood count is higher, protein level improving she is back on Lasix 40 daily.  Will decrease amlodipine down to 5    Total encounter time more than 45 minutes  Greater than 50% was spent in counseling and coordination of care with the patient   Disposition:   F/U  6 months   No orders of the defined types were placed in this encounter.    Signed, Esmond Plants, M.D., Ph.D. 03/18/2018  [place], [place]

## 2018-03-18 NOTE — Patient Instructions (Addendum)
Medication Instructions:   Please decrease the amlodipine down to 5 mg daily Take extra amlodipine 5 mg as needed (PRN) for rayneuds disease  Labwork:  No new labs needed  Testing/Procedures:  No further testing at this time   Follow-Up: It was a pleasure seeing you in the office today. Please call us if you have new issues that need to be addressed before your next appt.  [phone]  Your physician wants you to follow-up in: 6 months.  You will receive a reminder letter in the mail two months in advance. If you don't receive a letter, please call our office to schedule the follow-up appointment.  If you need a refill on your cardiac medications before your next appointment, please call your pharmacy.  For educational health videos Log in to : [email or web address] Or : [email or web address], password : [email or web address]

## 2018-03-20 LAB — URINE CULTURE: Culture: >=100000 — AB

## 2018-03-29 NOTE — Addendum Note (Signed)
Addended by: Britt Bottom on: 03/29/2018 09:01 AM   Modules accepted: Orders

## 2018-04-27 ENCOUNTER — Encounter (INDEPENDENT_AMBULATORY_CARE_PROVIDER_SITE_OTHER): Payer: Self-pay | Admitting: Vascular Surgery

## 2018-04-27 ENCOUNTER — Ambulatory Visit (INDEPENDENT_AMBULATORY_CARE_PROVIDER_SITE_OTHER): Payer: Medicare Other | Admitting: Vascular Surgery

## 2018-04-27 VITALS — BP 132/68 | HR 72 | Resp 17 | Ht 62.0 in | Wt 197.0 lb

## 2018-04-27 DIAGNOSIS — I7300 Raynaud's syndrome without gangrene: Secondary | ICD-10-CM

## 2018-04-27 DIAGNOSIS — R600 Localized edema: Secondary | ICD-10-CM | POA: Diagnosis not present

## 2018-04-27 DIAGNOSIS — I10 Essential (primary) hypertension: Secondary | ICD-10-CM

## 2018-04-27 DIAGNOSIS — E782 Mixed hyperlipidemia: Secondary | ICD-10-CM | POA: Diagnosis not present

## 2018-04-27 DIAGNOSIS — I25118 Atherosclerotic heart disease of native coronary artery with other forms of angina pectoris: Secondary | ICD-10-CM | POA: Diagnosis not present

## 2018-04-27 NOTE — Assessment & Plan Note (Signed)
Easily noticeable and has been fairly significant for many years.  Already on a calcium channel blocker and aspirin.

## 2018-04-27 NOTE — Progress Notes (Signed)
Patient ID: Erin [name], female   DOB: May 20, 1946, 72 y.o.   MRN: [id number]  Chief Complaint  Patient presents with  . New Patient (Initial Visit)    Bilateral Edema    HPI Erin [name] is a 72 y.o. female.  I am asked to see the patient by Dr. Doy Hutching for evaluation of leg swelling.  The patient reports the swelling significantly worsened after her hip surgery about 9 to 10 weeks ago.  It has gotten some better, and they took about 20 pounds of fluid off her legs with diuresis.  Nonetheless, significant swelling is still present bilaterally.  This creates heaviness and tiredness in the legs.  There is some darkness and discoloration of the legs.  She also has significant Raynaud's disease her toes are very purple today and much of the time.  No open ulcerations or infection.  No fevers or chills.  No previous history of DVT or phlebitis to her knowledge.  Both lower extremities are affected.  Elevation has helped a little.   Past Medical History:  Diagnosis Date  . Anemia    h/o  . Cancer ([place])    melanoma x 2 the last one was of her leg and surgeon removed it  . Colon polyp   . Coronary artery calcification   . Coronary artery disease   . DDD (degenerative disc disease), lumbar   . Diverticulosis 01/20/2017  . Dysphagia   . Dyspnea    with exertion-unable to walk a mile without getting sob- dr sparks set pt up to see Dr Rockey Situ after 11-19-16 surgery  . Environmental allergies   . Esophageal dysmotility   . Fibrocystic breast disease   . GERD (gastroesophageal reflux disease)   . Heart murmur    h/o   . Hyperlipidemia    unspecified  . Hypertension   . Mitral regurgitation   . Monilial esophagitis ([place]) 01/20/2017  . Obstipation   . Osteopenia   . Peptic ulcer disease   . Pulmonary fibrosis ([place])    per Dr Raul Del  . RA (rheumatoid arthritis) ([place])   . Raynaud's disease   . Redundant colon 01/20/2017  . Scleroderma ([place])   . Telangiectasia of colon   . Tubular  adenoma of colon 01/20/2017   unspecified  . Vulvar dysplasia     Past Surgical History:  Procedure Laterality Date  . ABDOMINAL HYSTERECTOMY    . BREAST CYST EXCISION Left 20+ years ago   No scar visible  . BREAST LUMPECTOMY Right   . CARPAL TUNNEL RELEASE Bilateral   . COLON SURGERY     colon polyp   . COLONOSCOPY  09/30/2011   tubular adenoma rtm  . COLONOSCOPY     05/02/2003, 04/10/2000  . COLONOSCOPY WITH PROPOFOL N/A 01/20/2017   Procedure: COLONOSCOPY WITH PROPOFOL;  Surgeon: Lollie Sails, MD;  Location: [facility] ENDOSCOPY;  Service: Endoscopy;  Laterality: N/A;  . ESOPHAGOGASTRODUODENOSCOPY    . ESOPHAGOGASTRODUODENOSCOPY     09/30/2011, 04/10/2000 , no repeat rtm  . ESOPHAGOGASTRODUODENOSCOPY N/A 03/02/2018   Procedure: ESOPHAGOGASTRODUODENOSCOPY (EGD);  Surgeon: Virgel Manifold, MD;  Location: [facility] ENDOSCOPY;  Service: Endoscopy;  Laterality: N/A;  . ESOPHAGOGASTRODUODENOSCOPY (EGD) WITH PROPOFOL N/A 01/20/2017   Procedure: ESOPHAGOGASTRODUODENOSCOPY (EGD) WITH PROPOFOL;  Surgeon: Lollie Sails, MD;  Location: [facility] ENDOSCOPY;  Service: Endoscopy;  Laterality: N/A;  . EXCISION HYDRADENITIS LABIA Left 11/19/2016   Procedure: EXCISION LABIAL MASS;  Surgeon: Robert Bellow, MD;  Location: [facility] ORS;  Service: General;  Laterality: Left;  . EYE SURGERY Bilateral    cataracts  . HEEL SPUR EXCISION    . HIP ARTHROPLASTY Left 02/23/2018   Procedure: ARTHROPLASTY BIPOLAR HIP (HEMIARTHROPLASTY);  Surgeon: Thornton Park, MD;  Location: [facility] ORS;  Service: Orthopedics;  Laterality: Left;  Marland Kitchen MASS EXCISION Left 11/19/2016   Procedure: EXCISION LEFT THIGH MELANOMA;  Surgeon: Robert Bellow, MD;  Location: [facility] ORS;  Service: General;  Laterality: Left;  . SENTINEL NODE BIOPSY Left 11/19/2016   Procedure: INGUINAL SENTINEL NODE BIOPSY;  Surgeon: Robert Bellow, MD;  Location: [facility] ORS;  Service: General;  Laterality: Left;  . UPPER ESOPHAGEAL ENDOSCOPIC ULTRASOUND (EUS)  N/A 01/29/2017   Procedure: UPPER ESOPHAGEAL ENDOSCOPIC ULTRASOUND (EUS);  Surgeon: Holly Bodily, MD;  Location: [facility] ENDOSCOPY;  Service: Endoscopy;  Laterality: N/A;    Family History  Problem Relation Age of Onset  . Endometrial cancer Mother 64  . Hypertension Mother   . Osteoporosis Mother   . Bladder Cancer Father   . COPD Father   . Cerebral palsy Sister      Social History Social History   Tobacco Use  . Smoking status: Never Smoker  . Smokeless tobacco: Never Used  Substance Use Topics  . Alcohol use: No  . Drug use: No     Allergies  Allergen Reactions  . Remicade [Infliximab] Shortness Of Breath and Itching  . Sulfa Antibiotics Other (See Comments)    Other Reaction: tounge cracked  . Actonel [Risedronate Sodium] Other (See Comments)  . Fosamax [Alendronate Sodium]     GI Bleed   . Procardia [Nifedipine] Hives  . Sulfur Swelling  . Wellbutrin [Bupropion] Anxiety    Current Outpatient Medications  Medication Sig Dispense Refill  . ALPRAZolam (XANAX) 0.5 MG tablet Take 1 tablet (0.5 mg total) by mouth 4 (four) times daily. 120 tablet 0  . amLODipine (NORVASC) 10 MG tablet Take 5 mg by mouth daily. 1/2 tab    . aspirin 325 MG EC tablet Take 325 mg by mouth 2 (two) times daily.    . cetirizine (ZYRTEC) 10 MG tablet Take 10 mg by mouth daily as needed for allergies.     Marland Kitchen dexlansoprazole (DEXILANT) 60 MG capsule Take 1 capsule (60 mg total) by mouth daily. 30 capsule 0  . ezetimibe (ZETIA) 10 MG tablet TAKE ONE TABLET BY MOUTH EVERY DAY 30 tablet 11  . FLUoxetine (PROZAC) 20 MG capsule Take 20 mg by mouth daily.     . furosemide (LASIX) 20 MG tablet Take 9ms daily as needed for swelling    . gabapentin (NEURONTIN) 300 MG capsule Take 300 mg by mouth 3 (three) times daily.    . hydroxychloroquine (PLAQUENIL) 200 MG tablet Take 200 mg by mouth 2 (two) times daily.     .Marland Kitchenlatanoprost (XALATAN) 0.005 % ophthalmic solution Place 1 drop into both eyes at  bedtime.     . Multiple Vitamin (MULTI-VITAMINS) TABS Take 1 tablet by mouth daily.     . Omega-3 Fatty Acids (FISH OIL) 1000 MG CAPS Take 1 capsule by mouth 2 (two) times daily.     . polyethylene glycol (MIRALAX / GLYCOLAX) packet Take 17 g by mouth daily.     . predniSONE (DELTASONE) 5 MG tablet Take 5 mg by mouth 2 (two) times daily with a meal.     . simvastatin (ZOCOR) 20 MG tablet Take 20 mg by mouth at bedtime.    .Marland Kitchenzolpidem (AMBIEN) 5 MG  tablet Take 1 tablet (5 mg total) by mouth at bedtime as needed. 14 tablet 0  . acetaminophen (TYLENOL) 325 MG tablet Take 650 mg by mouth 4 (four) times daily.    Marland Kitchen alum & mag hydroxide-simeth (MAALOX PLUS) 400-400-40 MG/5ML suspension Take 30 mLs by mouth every 4 (four) hours as needed.    . Amino Acids-Protein Hydrolys (FEEDING SUPPLEMENT, PRO-STAT SUGAR FREE 64,) LIQD Take 30 mLs by mouth 2 (two) times daily between meals.    . bisacodyl (DULCOLAX) 10 MG suppository Place 10 mg rectally daily as needed.    . famotidine (PEPCID) 20 MG tablet Take 20 mg by mouth daily.    . ferrous sulfate 325 (65 FE) MG tablet Take 1 tablet (325 mg total) by mouth 3 (three) times daily after meals. (Patient not taking: Reported on 04/27/2018) 60 tablet 3  . furosemide (LASIX) 40 MG tablet Take 40 mg by mouth daily.  90 tablet 3  . guaifenesin (ROBITUSSIN) 100 MG/5ML syrup Take 200 mg by mouth every 4 (four) hours as needed for cough.    . hydrocortisone cream 1 % Apply 1 application topically 2 (two) times daily. liberal amounts to both lower legs.    Marland Kitchen ipratropium-albuterol (DUONEB) 0.5-2.5 (3) MG/3ML SOLN Take 3 mLs by nebulization every 4 (four) hours as needed (wheezing, dyspnea).    . magnesium hydroxide (MILK OF MAGNESIA) 400 MG/5ML suspension Take 30 mLs by mouth every 4 (four) hours as needed. Constipation/ no BM for 2 days    . Nutritional Supplements (ENSURE ENLIVE PO) Take 1 Bottle by mouth 2 (two) times daily between meals.    Marland Kitchen oxyCODONE (OXY IR/ROXICODONE)  5 MG immediate release tablet Take 1 tablet (5 mg total) by mouth every 4 (four) hours as needed for moderate pain (pain score 4-6). (Patient not taking: Reported on 04/27/2018) 120 tablet 0  . sorbitol 70 % solution Take 30 mLs by mouth every 2 (two) hours as needed (constipation). hours until large BM. Assess BM for GI bleed. hgb low- constipation    . triamcinolone cream (KENALOG) 0.5 % Apply topically.     No current facility-administered medications for this visit.       REVIEW OF SYSTEMS (Negative unless checked)  Constitutional: _0 Weight loss  _1 Fever  _2 Chills Cardiac: _3 Chest pain   _4 Chest pressure   _5 Palpitations   _6 Shortness of breath when laying flat   _7 Shortness of breath at rest   _8 Shortness of breath with exertion. Vascular:  _9 Pain in legs with walking   _10 Pain in legs at rest   _11 Pain in legs when laying flat   _12 Claudication   _13 Pain in feet when walking  _14 Pain in feet at rest  _15 Pain in feet when laying flat   _16 History of DVT   _17 Phlebitis   _18 Swelling in legs   _19 Varicose veins   _20 Non-healing ulcers Pulmonary:   _21 Uses home oxygen   _22 Productive cough   _23 Hemoptysis   _24 Wheeze  _25 COPD   _26 Asthma Neurologic:  _27 Dizziness  _28 Blackouts   _29 Seizures   _30 History of stroke   _31 History of TIA  _32 Aphasia   _33 Temporary blindness   _34 Dysphagia   _35 Weakness or numbness in arms   _36 Weakness or numbness in legs Musculoskeletal:  _37 Arthritis   _38 Joint swelling   _39 Joint pain   _40 Low back pain Hematologic:  _41 Easy bruising  _42 Easy bleeding   _43 Hypercoagulable state   _44 Anemic  _45 Hepatitis Gastrointestinal:  _46 Blood in stool   _47 Vomiting blood  _48 Gastroesophageal reflux/heartburn   _49 Abdominal pain Genitourinary:  _50   Chronic kidney disease   _0 Difficult urination  _1 Frequent urination  _2 Burning with urination   _3 Hematuria Skin:  _4 Rashes   _5 Ulcers   _6 Wounds Psychological:  _7 History of anxiety   _8  History of major depression.    Physical Exam BP 132/68 (BP Location: Left  Arm)   Pulse 72   Resp 17   Ht _9  (1.575 m)   Wt 197 lb (89.4 kg)   BMI 36.03 kg/m  Gen:  WD/WN, NAD Head: [place]/AT, No temporalis wasting Ear/Nose/Throat: Hearing grossly intact, nares w/o erythema or drainage, oropharynx w/o Erythema/Exudate Eyes: Conjunctiva clear, sclera non-icteric  Neck: trachea midline.  Pulmonary:  Good air movement, respirations not labored, no use of accessory muscles Cardiac: RRR, no JVD Vascular:  Vessel Right Left  Radial Palpable Palpable                          PT Trace Palpable Trace Palpable  DP 1+ Palpable 2+ Palpable    Musculoskeletal: M/S 5/5 throughout. Walks with a walker.  Cyanosis of the great and second toes bilaterally.  Mild to moderate venous congestion bilaterally.  No deformity or atrophy. 1-2+ BLE edema. Neurologic: Sensation grossly intact in extremities.  Symmetrical.  Speech is fluent. Motor exam as listed above. Psychiatric: Judgment intact, Mood & affect appropriate for pt's clinical situation. Dermatologic: No rashes or ulcers noted.  No cellulitis or open wounds.  Radiology No results found.  Labs Recent Results (from the past 2160 hour(s))  Comprehensive metabolic panel     Status: Abnormal   Collection Time: 02/22/18  3:52 PM  Result Value Ref Range   Sodium 130 (L) 135 - 145 mmol/L   Potassium 4.3 3.5 - 5.1 mmol/L   Chloride 98 (L) 101 - 111 mmol/L   CO2 23 22 - 32 mmol/L   Glucose, Bld 112 (H) 65 - 99 mg/dL   BUN 25 (H) 6 - 20 mg/dL   Creatinine, Ser 0.92 0.44 - 1.00 mg/dL   Calcium 8.2 (L) 8.9 - 10.3 mg/dL   Total Protein 6.4 (L) 6.5 - 8.1 g/dL   Albumin 3.4 (L) 3.5 - 5.0 g/dL   AST 32 15 - 41 U/L   ALT 29 14 - 54 U/L   Alkaline Phosphatase 88 38 - 126 U/L   Total Bilirubin 0.6 0.3 - 1.2 mg/dL   GFR calc non Af Amer >60 >60 mL/min   GFR calc Af Amer >60 >60 mL/min    Comment: (NOTE) The eGFR has been calculated using the CKD EPI equation. This calculation has not been validated in all clinical  situations. eGFR's persistently <60 mL/min signify possible Chronic Kidney Disease.    Anion gap 9 5 - 15    Comment: Performed at [facility], [address] [place], [place] [address]  Troponin I     Status: None   Collection Time: 02/22/18  3:52 PM  Result Value Ref Range   Troponin I <0.03 <0.03 ng/mL    Comment: Performed at [facility], [address] [place],  [address]  CBC with Differential     Status: Abnormal   Collection Time: 02/22/18  3:52 PM  Result Value Ref Range   WBC 11.1 (H) 3.6 - 11.0 K/uL   RBC 4.05 3.80 - 5.20 MIL/uL   Hemoglobin 10.7 (L) 12.0 - 16.0 g/dL   HCT 33.8 (L) 35.0 - 47.0 %   MCV 83.7 80.0 - 100.0 fL   MCH  26.4 26.0 - 34.0 pg   MCHC 31.6 (L) 32.0 - 36.0 g/dL   RDW 17.0 (H) 11.5 - 14.5 %   Platelets 291 150 - 440 K/uL   Neutrophils Relative % 84 %   Neutro Abs 9.4 (H) 1.4 - 6.5 K/uL   Lymphocytes Relative 8 %   Lymphs Abs 0.8 (L) 1.0 - 3.6 K/uL   Monocytes Relative 7 %   Monocytes Absolute 0.8 0.2 - 0.9 K/uL   Eosinophils Relative 0 %   Eosinophils Absolute 0.0 0 - 0.7 K/uL   Basophils Relative 1 %   Basophils Absolute 0.1 0 - 0.1 K/uL    Comment: Performed at [facility], [address] [place], [place] [address]  Type and screen [place]     Status: None   Collection Time: 02/22/18  3:52 PM  Result Value Ref Range   ABO/RH(D) O POS    Antibody Screen NEG    Sample Expiration      02/25/2018 Performed at [facility], [address] [place], [place] [address]   Protime-INR     Status: None   Collection Time: 02/22/18  3:52 PM  Result Value Ref Range   Prothrombin Time 12.0 11.4 - 15.2 seconds   INR 0.89     Comment: Performed at [facility], [address] [place], [place] [address]  Surgical pcr screen     Status: None   Collection Time: 02/22/18 10:39 PM  Result Value Ref Range   MRSA, PCR NEGATIVE NEGATIVE   Staphylococcus aureus NEGATIVE  NEGATIVE    Comment: (NOTE) The Xpert SA Assay (FDA approved for NASAL specimens in patients 17 years of age and older), is one component of a comprehensive surveillance program. It is not intended to diagnose infection nor to guide or monitor treatment. Performed at [facility], [address] [place], [place] [address]   Surgical pathology     Status: None   Collection Time: 02/23/18  2:15 PM  Result Value Ref Range   SURGICAL PATHOLOGY      Surgical Pathology CASE: [id number] PATIENT: Damia Burnsworth Surgical Pathology Report     SPECIMEN SUBMITTED: A. Femoral head, left  CLINICAL HISTORY: None provided  PRE-OPERATIVE DIAGNOSIS: Left hip fracture  POST-OPERATIVE DIAGNOSIS: Same as pre-op     DIAGNOSIS: A.  LEFT FEMORAL HEAD; HEMIARTHROPLASTY: - FRAGMENTS OF REACTIVE BONE AND MARROW STROMA CONSISTENT WITH PROVIDED CLINICAL HISTORY OF FRACTURE. - TRILINEAGE HEMATOPOIESIS.   GROSS DESCRIPTION:  A. Labeled: left femoral head  Size of specimen:      Head -4.1 x 4.3 cm      Neck -aggregate, 3.2 x 2.9 cm  Articular surface: pink-tan with focal granularity  Cut surface: red-tan  Other findings: received in formalin  Block summary: 1 -2- representative section(s), post decalcification    Final Diagnosis performed by Delorse Lek, MD.  Electronically signed 02/26/2018 12:59:05PM    The electronic signature indicates that the named Attending Pathologist has evaluated the specimen   Technical component performed at [facility], [address], [place], [place] [address] Lab: [phone] Dir: Rush Farmer, MD, MMM  Professional component performed at [facility], [facility], [place], [place],  [address] Lab: [phone] Dir: Dellia Nims. Rubinas, MD    CBC     Status: Abnormal   Collection Time: 02/24/18  5:13 AM  Result Value Ref Range   WBC 13.5 (H) 3.6 - 11.0 K/uL   RBC 3.25 (L) 3.80 -  5.20 MIL/uL    Hemoglobin 8.6 (L) 12.0 - 16.0 g/dL   HCT 27.4 (L) 35.0 - 47.0 %   MCV 84.2 80.0 - 100.0 fL   MCH 26.5 26.0 - 34.0 pg   MCHC 31.5 (L) 32.0 - 36.0 g/dL   RDW 16.8 (H) 11.5 - 14.5 %   Platelets 204 150 - 440 K/uL    Comment: Performed at [facility], [address] [place], [place] [address]  Basic metabolic panel     Status: Abnormal   Collection Time: 02/24/18  5:13 AM  Result Value Ref Range   Sodium 132 (L) 135 - 145 mmol/L   Potassium 4.7 3.5 - 5.1 mmol/L   Chloride 102 101 - 111 mmol/L   CO2 24 22 - 32 mmol/L   Glucose, Bld 109 (H) 65 - 99 mg/dL   BUN 22 (H) 6 - 20 mg/dL   Creatinine, Ser 1.16 (H) 0.44 - 1.00 mg/dL   Calcium 8.0 (L) 8.9 - 10.3 mg/dL   GFR calc non Af Amer 46 (L) >60 mL/min   GFR calc Af Amer 53 (L) >60 mL/min    Comment: (NOTE) The eGFR has been calculated using the CKD EPI equation. This calculation has not been validated in all clinical situations. eGFR's persistently <60 mL/min signify possible Chronic Kidney Disease.    Anion gap 6 5 - 15    Comment: Performed at [facility], [address] [place], [place] [address]  CBC     Status: Abnormal   Collection Time: 02/25/18  6:29 AM  Result Value Ref Range   WBC 16.2 (H) 3.6 - 11.0 K/uL   RBC 3.21 (L) 3.80 - 5.20 MIL/uL   Hemoglobin 8.3 (L) 12.0 - 16.0 g/dL   HCT 26.6 (L) 35.0 - 47.0 %   MCV 82.8 80.0 - 100.0 fL   MCH 26.0 26.0 - 34.0 pg   MCHC 31.3 (L) 32.0 - 36.0 g/dL   RDW 17.1 (H) 11.5 - 14.5 %   Platelets 220 150 - 440 K/uL    Comment: Performed at [facility], [address] [place], [place] [address]  Basic metabolic panel     Status: Abnormal   Collection Time: 02/25/18  6:29 AM  Result Value Ref Range   Sodium 130 (L) 135 - 145 mmol/L   Potassium 4.8 3.5 - 5.1 mmol/L   Chloride 99 (L) 101 - 111 mmol/L   CO2 24 22 - 32 mmol/L   Glucose, Bld 121 (H) 65 - 99 mg/dL   BUN 19 6 - 20 mg/dL   Creatinine, Ser 1.00 0.44 - 1.00 mg/dL   Calcium 8.3 (L) 8.9 -  10.3 mg/dL   GFR calc non Af Amer 55 (L) >60 mL/min   GFR calc Af Amer >60 >60 mL/min    Comment: (NOTE) The eGFR has been calculated using the CKD EPI equation. This calculation has not been validated in all clinical situations. eGFR's persistently <60 mL/min signify possible Chronic Kidney Disease.    Anion gap 7 5 - 15    Comment: Performed at [facility], [address] [place], [place] [address]  CBC with Differential/Platelet     Status: Abnormal   Collection Time: 02/26/18  1:12 PM  Result Value Ref Range   WBC 14.0 (H) 3.6 - 11.0 K/uL   RBC 2.95 (L) 3.80 - 5.20 MIL/uL   Hemoglobin 7.7 (L) 12.0 - 16.0 g/dL   HCT 24.3 (L) 35.0 - 47.0 %  MCV 82.3 80.0 - 100.0 fL   MCH 26.3 26.0 - 34.0 pg   MCHC 31.9 (L) 32.0 - 36.0 g/dL   RDW 16.8 (H) 11.5 - 14.5 %   Platelets 245 150 - 440 K/uL   Neutrophils Relative % 74 %   Lymphocytes Relative 10 %   Monocytes Relative 14 %   Eosinophils Relative 1 %   Basophils Relative 0 %   Myelocytes 1 %   Neutro Abs 10.5 (H) 1.4 - 6.5 K/uL   Lymphs Abs 1.4 1.0 - 3.6 K/uL   Monocytes Absolute 2.0 (H) 0.2 - 0.9 K/uL   Eosinophils Absolute 0.1 0 - 0.7 K/uL   Basophils Absolute 0.0 0 - 0.1 K/uL   RBC Morphology POLYCHROMASIA PRESENT     Comment: MIXED RBC POPULATION   Smear Review      PLATELET CLUMPS NOTED ON SMEAR, COUNT APPEARS ADEQUATE    Comment: Performed at [facility], [address] [place], [place] [address]  Comprehensive metabolic panel     Status: Abnormal   Collection Time: 02/26/18  1:12 PM  Result Value Ref Range   Sodium 130 (L) 135 - 145 mmol/L   Potassium 4.9 3.5 - 5.1 mmol/L   Chloride 98 (L) 101 - 111 mmol/L   CO2 21 (L) 22 - 32 mmol/L   Glucose, Bld 132 (H) 65 - 99 mg/dL   BUN 21 (H) 6 - 20 mg/dL   Creatinine, Ser 0.89 0.44 - 1.00 mg/dL   Calcium 8.4 (L) 8.9 - 10.3 mg/dL   Total Protein 6.2 (L) 6.5 - 8.1 g/dL   Albumin 2.4 (L) 3.5 - 5.0 g/dL   AST 42 (H) 15 - 41 U/L   ALT 27 14 - 54 U/L    Alkaline Phosphatase 105 38 - 126 U/L   Total Bilirubin 0.6 0.3 - 1.2 mg/dL   GFR calc non Af Amer >60 >60 mL/min   GFR calc Af Amer >60 >60 mL/min    Comment: (NOTE) The eGFR has been calculated using the CKD EPI equation. This calculation has not been validated in all clinical situations. eGFR's persistently <60 mL/min signify possible Chronic Kidney Disease.    Anion gap 11 5 - 15    Comment: Performed at [facility], [address] [place], [place] [address]  Brain natriuretic peptide     Status: None   Collection Time: 02/26/18  1:13 PM  Result Value Ref Range   B Natriuretic Peptide 84.0 0.0 - 100.0 pg/mL    Comment: Performed at [facility], [address] [place], [place] [address]  CBC with Differential/Platelet     Status: Abnormal   Collection Time: 02/27/18  3:47 PM  Result Value Ref Range   WBC 14.5 (H) 3.6 - 11.0 K/uL   RBC 2.55 (L) 3.80 - 5.20 MIL/uL   Hemoglobin 6.9 (L) 12.0 - 16.0 g/dL   HCT 21.3 (L) 35.0 - 47.0 %   MCV 83.8 80.0 - 100.0 fL   MCH 26.9 26.0 - 34.0 pg   MCHC 32.2 32.0 - 36.0 g/dL   RDW 17.4 (H) 11.5 - 14.5 %   Platelets 252 150 - 440 K/uL   Neutrophils Relative % 85 %   Neutro Abs 12.4 (H) 1.4 - 6.5 K/uL   Lymphocytes Relative 4 %   Lymphs Abs 0.5 (L) 1.0 - 3.6 K/uL   Monocytes Relative 11 %   Monocytes Absolute 1.6 (H) 0.2 - 0.9 K/uL   Eosinophils Relative  0 %   Eosinophils Absolute 0.0 0 - 0.7 K/uL   Basophils Relative 0 %   Basophils Absolute 0.1 0 - 0.1 K/uL    Comment: Performed at [facility], [address] [place], [place] [address]  Basic metabolic panel     Status: Abnormal   Collection Time: 02/27/18  3:47 PM  Result Value Ref Range   Sodium 133 (L) 135 - 145 mmol/L   Potassium 5.3 (H) 3.5 - 5.1 mmol/L   Chloride 99 (L) 101 - 111 mmol/L   CO2 24 22 - 32 mmol/L   Glucose, Bld 112 (H) 65 - 99 mg/dL   BUN 30 (H) 6 - 20 mg/dL   Creatinine, Ser 0.98 0.44 - 1.00 mg/dL   Calcium 8.5 (L) 8.9 -  10.3 mg/dL   GFR calc non Af Amer 56 (L) >60 mL/min   GFR calc Af Amer >60 >60 mL/min    Comment: (NOTE) The eGFR has been calculated using the CKD EPI equation. This calculation has not been validated in all clinical situations. eGFR's persistently <60 mL/min signify possible Chronic Kidney Disease.    Anion gap 10 5 - 15    Comment: Performed at [facility], [address] [place], [place] [address]  CBC with Differential/Platelet     Status: Abnormal   Collection Time: 02/28/18  4:55 AM  Result Value Ref Range   WBC 16.9 (H) 3.6 - 11.0 K/uL   RBC 2.31 (L) 3.80 - 5.20 MIL/uL   Hemoglobin 6.0 (L) 12.0 - 16.0 g/dL   HCT 19.2 (L) 35.0 - 47.0 %   MCV 83.2 80.0 - 100.0 fL   MCH 26.1 26.0 - 34.0 pg   MCHC 31.3 (L) 32.0 - 36.0 g/dL   RDW 17.0 (H) 11.5 - 14.5 %   Platelets 279 150 - 440 K/uL   Neutrophils Relative % 80 %   Lymphocytes Relative 9 %   Monocytes Relative 5 %   Eosinophils Relative 0 %   Basophils Relative 0 %   Band Neutrophils 4 %   Metamyelocytes Relative 2 %   Myelocytes 0 %   Promyelocytes Absolute 0 %   Blasts 0 %   nRBC 0 0 /100 WBC   Other 0 %   Neutro Abs 14.6 (H) 1.4 - 6.5 K/uL   Lymphs Abs 1.5 1.0 - 3.6 K/uL   Monocytes Absolute 0.8 0.2 - 0.9 K/uL   Eosinophils Absolute 0.0 0 - 0.7 K/uL   Basophils Absolute 0.0 0 - 0.1 K/uL   RBC Morphology POLYCHROMASIA PRESENT     Comment: MIXED RBC POPULATION Performed at [facility], [address] [place], [place] [address]   Basic metabolic panel     Status: Abnormal   Collection Time: 02/28/18  4:55 AM  Result Value Ref Range   Sodium 132 (L) 135 - 145 mmol/L   Potassium 4.9 3.5 - 5.1 mmol/L   Chloride 101 101 - 111 mmol/L   CO2 23 22 - 32 mmol/L   Glucose, Bld 130 (H) 65 - 99 mg/dL   BUN 38 (H) 6 - 20 mg/dL   Creatinine, Ser 1.01 (H) 0.44 - 1.00 mg/dL   Calcium 8.0 (L) 8.9 - 10.3 mg/dL   GFR calc non Af Amer 54 (L) >60 mL/min   GFR calc Af Amer >60 >60 mL/min    Comment:  (NOTE) The eGFR has been calculated using the CKD EPI equation. This calculation has not been validated in all  clinical situations. eGFR's persistently <60 mL/min signify possible Chronic Kidney Disease.    Anion gap 8 5 - 15    Comment: Performed at [facility], [address] [place], [place] [address]  Comprehensive metabolic panel     Status: Abnormal   Collection Time: 02/28/18  8:31 PM  Result Value Ref Range   Sodium 134 (L) 135 - 145 mmol/L   Potassium 5.0 3.5 - 5.1 mmol/L   Chloride 101 101 - 111 mmol/L   CO2 22 22 - 32 mmol/L   Glucose, Bld 158 (H) 65 - 99 mg/dL   BUN 38 (H) 6 - 20 mg/dL   Creatinine, Ser 0.82 0.44 - 1.00 mg/dL   Calcium 8.0 (L) 8.9 - 10.3 mg/dL   Total Protein 6.1 (L) 6.5 - 8.1 g/dL   Albumin 2.3 (L) 3.5 - 5.0 g/dL   AST 102 (H) 15 - 41 U/L   ALT 109 (H) 14 - 54 U/L   Alkaline Phosphatase 128 (H) 38 - 126 U/L   Total Bilirubin 0.3 0.3 - 1.2 mg/dL   GFR calc non Af Amer >60 >60 mL/min   GFR calc Af Amer >60 >60 mL/min    Comment: (NOTE) The eGFR has been calculated using the CKD EPI equation. This calculation has not been validated in all clinical situations. eGFR's persistently <60 mL/min signify possible Chronic Kidney Disease.    Anion gap 11 5 - 15    Comment: Performed at [facility], [address] [place], [place] [address]  CBC with Differential     Status: Abnormal   Collection Time: 02/28/18  8:31 PM  Result Value Ref Range   WBC 18.5 (H) 3.6 - 11.0 K/uL   RBC 2.43 (L) 3.80 - 5.20 MIL/uL   Hemoglobin 6.4 (L) 12.0 - 16.0 g/dL   HCT 20.2 (L) 35.0 - 47.0 %   MCV 83.0 80.0 - 100.0 fL   MCH 26.2 26.0 - 34.0 pg   MCHC 31.5 (L) 32.0 - 36.0 g/dL   RDW 17.2 (H) 11.5 - 14.5 %   Platelets 345 150 - 440 K/uL   Neutrophils Relative % 85 %   Lymphocytes Relative 5 %   Monocytes Relative 4 %   Eosinophils Relative 0 %   Basophils Relative 0 %   Band Neutrophils 0 %   Metamyelocytes Relative 3 %   Myelocytes 3 %    Promyelocytes Absolute 0 %   Blasts 0 %   nRBC 2 (H) 0 /100 WBC   Other 0 %   Neutro Abs 16.9 (H) 1.4 - 6.5 K/uL   Lymphs Abs 0.9 (L) 1.0 - 3.6 K/uL   Monocytes Absolute 0.7 0.2 - 0.9 K/uL   Eosinophils Absolute 0.0 0 - 0.7 K/uL   Basophils Absolute 0.0 0 - 0.1 K/uL   RBC Morphology MIXED RBC POPULATION     Comment: POLYCHROMASIA PRESENT Performed at [facility], [address] [place], [place] [address]   Type and screen [place]     Status: None   Collection Time: 02/28/18  8:31 PM  Result Value Ref Range   ABO/RH(D) O POS    Antibody Screen NEG    Sample Expiration 03/03/2018    Unit Number [id number]    Blood Component Type RBC LR PHER1    Unit division 00    Status of Unit ISSUED,FINAL    Transfusion Status OK TO TRANSFUSE    Crossmatch Result  Compatible Performed at [facility], [address] [place], [place] [address]    Unit Number [id number]    Blood Component Type RED CELLS,LR    Unit division 00    Status of Unit ISSUED,FINAL    Transfusion Status OK TO TRANSFUSE    Crossmatch Result Compatible   Prepare RBC     Status: None   Collection Time: 02/28/18  8:31 PM  Result Value Ref Range   Order Confirmation      ORDER PROCESSED BY BLOOD BANK Performed at [facility], [address] [place], [place] [address]   BPAM RBC     Status: None   Collection Time: 02/28/18  8:31 PM  Result Value Ref Range   ISSUE DATE / TIME [id number]    Blood Product Unit Number [id number]    PRODUCT CODE O9735H29    Unit Type and Rh 5100    Blood Product Expiration Date [id number]    ISSUE DATE / TIME [id number]    Blood Product Unit Number [id number]    PRODUCT CODE [id number]    Unit Type and Rh 5100    Blood Product Expiration Date [id number]   Urinalysis, Routine w reflex microscopic     Status: Abnormal   Collection Time: 03/01/18  6:51 AM  Result Value Ref Range   Color, Urine YELLOW  (A) YELLOW   APPearance CLEAR (A) CLEAR   Specific Gravity, Urine 1.010 1.005 - 1.030   pH 5.0 5.0 - 8.0   Glucose, UA NEGATIVE NEGATIVE mg/dL   Hgb urine dipstick NEGATIVE NEGATIVE   Bilirubin Urine NEGATIVE NEGATIVE   Ketones, ur NEGATIVE NEGATIVE mg/dL   Protein, ur NEGATIVE NEGATIVE mg/dL   Nitrite NEGATIVE NEGATIVE   Leukocytes, UA NEGATIVE NEGATIVE    Comment: Performed at [facility], [address] [place], [place] [address]  Basic metabolic panel     Status: Abnormal   Collection Time: 03/01/18  8:30 AM  Result Value Ref Range   Sodium 135 135 - 145 mmol/L   Potassium 5.0 3.5 - 5.1 mmol/L   Chloride 101 101 - 111 mmol/L   CO2 25 22 - 32 mmol/L   Glucose, Bld 82 65 - 99 mg/dL   BUN 33 (H) 6 - 20 mg/dL   Creatinine, Ser 0.75 0.44 - 1.00 mg/dL   Calcium 8.4 (L) 8.9 - 10.3 mg/dL   GFR calc non Af Amer >60 >60 mL/min   GFR calc Af Amer >60 >60 mL/min    Comment: (NOTE) The eGFR has been calculated using the CKD EPI equation. This calculation has not been validated in all clinical situations. eGFR's persistently <60 mL/min signify possible Chronic Kidney Disease.    Anion gap 9 5 - 15    Comment: Performed at [facility], [address] [place], [place] [address]  CBC     Status: Abnormal   Collection Time: 03/01/18  8:30 AM  Result Value Ref Range   WBC 20.8 (H) 3.6 - 11.0 K/uL   RBC 3.56 (L) 3.80 - 5.20 MIL/uL   Hemoglobin 10.0 (L) 12.0 - 16.0 g/dL    Comment: RESULT REPEATED AND VERIFIED   HCT 30.5 (L) 35.0 - 47.0 %   MCV 85.8 80.0 - 100.0 fL   MCH 28.0 26.0 - 34.0 pg   MCHC 32.7 32.0 - 36.0 g/dL   RDW 16.5 (H) 11.5 - 14.5 %   Platelets 343 150 - 440 K/uL    Comment: Performed at [facility], [address] [place], [place] [address]  Hepatic function panel     Status: Abnormal   Collection Time: 03/01/18  8:30 AM  Result Value Ref Range   Total Protein 6.1 (L) 6.5 - 8.1 g/dL   Albumin 2.5 (L) 3.5 - 5.0 g/dL   AST 66 (H) 15  - 41 U/L   ALT 95 (H) 14 - 54 U/L   Alkaline Phosphatase 126 38 - 126 U/L   Total Bilirubin 0.7 0.3 - 1.2 mg/dL   Bilirubin, Direct <0.1 (L) 0.1 - 0.5 mg/dL   Indirect Bilirubin NOT CALCULATED 0.3 - 0.9 mg/dL    Comment: Performed at [facility], [address] [place], [place] [address]  CBC with Differential/Platelet     Status: Abnormal   Collection Time: 03/02/18  4:35 AM  Result Value Ref Range   WBC 19.6 (H) 3.6 - 11.0 K/uL   RBC 3.93 3.80 - 5.20 MIL/uL   Hemoglobin 10.8 (L) 12.0 - 16.0 g/dL   HCT 33.4 (L) 35.0 - 47.0 %   MCV 85.0 80.0 - 100.0 fL   MCH 27.4 26.0 - 34.0 pg   MCHC 32.2 32.0 - 36.0 g/dL   RDW 16.7 (H) 11.5 - 14.5 %   Platelets 402 150 - 440 K/uL   Neutrophils Relative % 65 %   Lymphocytes Relative 9 %   Monocytes Relative 8 %   Eosinophils Relative 2 %   Basophils Relative 0 %   Band Neutrophils 3 %   Metamyelocytes Relative 6 %   Myelocytes 7 %   Promyelocytes Absolute 0 %   Blasts 0 %   nRBC 0 0 /100 WBC   Other 0 %   Neutro Abs 15.8 (H) 1.4 - 6.5 K/uL   Lymphs Abs 1.8 1.0 - 3.6 K/uL   Monocytes Absolute 1.6 (H) 0.2 - 0.9 K/uL   Eosinophils Absolute 0.4 0 - 0.7 K/uL   Basophils Absolute 0.0 0 - 0.1 K/uL   RBC Morphology MIXED RBC POPULATION     Comment: POLYCHROMASIA PRESENT Performed at [facility], [address] [place], [place] [address]   Basic metabolic panel     Status: Abnormal   Collection Time: 03/02/18  4:35 AM  Result Value Ref Range   Sodium 133 (L) 135 - 145 mmol/L   Potassium 4.4 3.5 - 5.1 mmol/L   Chloride 98 (L) 101 - 111 mmol/L   CO2 25 22 - 32 mmol/L   Glucose, Bld 101 (H) 65 - 99 mg/dL   BUN 38 (H) 6 - 20 mg/dL   Creatinine, Ser 1.11 (H) 0.44 - 1.00 mg/dL   Calcium 8.6 (L) 8.9 - 10.3 mg/dL   GFR calc non Af Amer 48 (L) >60 mL/min   GFR calc Af Amer 56 (L) >60 mL/min    Comment: (NOTE) The eGFR has been calculated using the CKD EPI equation. This calculation has not been validated in all clinical  situations. eGFR's persistently <60 mL/min signify possible Chronic Kidney Disease.    Anion gap 10 5 - 15    Comment: Performed at [facility], [address] [place], [place] [address]  Pathologist smear review     Status: None   Collection Time: 03/02/18  4:35 AM  Result Value Ref Range   Path Review Peripheral blood smear is reviewed.     Comment: 72 year old with recent arthroplasty who was admitted for anemia with hemoglobin of 6. Status post 2 u  PRBC. Dimorphic RBC population consistent with recent transfusion. Subset of RBCs are hypochromic. Occasional nucleated RBC. Leukocytosis with unremarkable WBC morphology. Unremarkable platelet morphology. Findings are compatible with acute on chronic anemia. Recommend iron studies to exclude underlying iron deficiency anemia. A hematology consult may be helpful if iron studies are normal. Reviewed by Dellia Nims. Reuel Derby, M.D. Performed at [facility], [address], [place], [place] [address]   Culture, Urine     Status: None   Collection Time: 03/02/18  6:51 AM  Result Value Ref Range   Specimen Description      URINE, RANDOM Performed at [facility], [address], [place], [place] [address]    Special Requests      NONE Performed at [facility], [address], [place], [place] [address]    Culture      NO GROWTH Performed at [facility], [address] [place], [place] [address]    Report Status 03/03/2018 FINAL   Urinalysis, Routine w reflex microscopic     Status: Abnormal   Collection Time: 03/02/18  6:51 AM  Result Value Ref Range   Color, Urine YELLOW (A) YELLOW   APPearance CLEAR (A) CLEAR   Specific Gravity, Urine 1.011 1.005 - 1.030   pH 5.0 5.0 - 8.0   Glucose, UA NEGATIVE NEGATIVE mg/dL   Hgb urine dipstick NEGATIVE NEGATIVE   Bilirubin Urine NEGATIVE NEGATIVE   Ketones, ur NEGATIVE NEGATIVE mg/dL   Protein, ur NEGATIVE NEGATIVE mg/dL   Nitrite NEGATIVE  NEGATIVE   Leukocytes, UA NEGATIVE NEGATIVE    Comment: Performed at [facility], [address] [place], [place] [address]  Urinalysis, Complete w Microscopic     Status: Abnormal   Collection Time: 03/17/18  7:00 AM  Result Value Ref Range   Color, Urine YELLOW (A) YELLOW   APPearance CLEAR (A) CLEAR   Specific Gravity, Urine 1.008 1.005 - 1.030   pH 6.0 5.0 - 8.0   Glucose, UA NEGATIVE NEGATIVE mg/dL   Hgb urine dipstick NEGATIVE NEGATIVE   Bilirubin Urine NEGATIVE NEGATIVE   Ketones, ur NEGATIVE NEGATIVE mg/dL   Protein, ur NEGATIVE NEGATIVE mg/dL   Nitrite NEGATIVE NEGATIVE   Leukocytes, UA TRACE (A) NEGATIVE   RBC / HPF 0-5 0 - 5 RBC/hpf   WBC, UA 0-5 0 - 5 WBC/hpf   Bacteria, UA RARE (A) NONE SEEN   Squamous Epithelial / LPF 0-5 (A) NONE SEEN   Mucus PRESENT    Hyaline Casts, UA PRESENT     Comment: Performed at [facility], [address], [place], [place] [address]  Urine Culture     Status: Abnormal   Collection Time: 03/17/18  7:00 AM  Result Value Ref Range   Specimen Description      URINE, RANDOM Performed at [facility], [address], [place], [place] [address]    Special Requests      NONE Performed at [facility], [address] [place], [place] [address]    Culture >=100,000 COLONIES/mL ESCHERICHIA COLI (A)    Report Status 03/20/2018 FINAL    Organism ID, Bacteria ESCHERICHIA COLI (A)       Susceptibility   Escherichia coli - MIC*    AMPICILLIN 4 SENSITIVE Sensitive     CEFAZOLIN <=4 SENSITIVE Sensitive     CEFTRIAXONE <=1 SENSITIVE Sensitive     CIPROFLOXACIN <=0.25 SENSITIVE Sensitive     GENTAMICIN <=1 SENSITIVE Sensitive  IMIPENEM <=0.25 SENSITIVE Sensitive     NITROFURANTOIN <=16 SENSITIVE Sensitive     TRIMETH/SULFA <=20 SENSITIVE Sensitive     AMPICILLIN/SULBACTAM <=2 SENSITIVE Sensitive     PIP/TAZO <=4 SENSITIVE Sensitive     Extended ESBL NEGATIVE Sensitive     * >=100,000 COLONIES/mL  ESCHERICHIA COLI  CBC with Differential/Platelet     Status: Abnormal   Collection Time: 03/17/18 10:50 AM  Result Value Ref Range   WBC 20.0 (H) 3.6 - 11.0 K/uL   RBC 4.06 3.80 - 5.20 MIL/uL   Hemoglobin 11.7 (L) 12.0 - 16.0 g/dL   HCT 36.8 35.0 - 47.0 %   MCV 90.4 80.0 - 100.0 fL   MCH 28.8 26.0 - 34.0 pg   MCHC 31.8 (L) 32.0 - 36.0 g/dL   RDW 20.1 (H) 11.5 - 14.5 %   Platelets 492 (H) 150 - 440 K/uL   Neutrophils Relative % 79 %   Lymphocytes Relative 9 %   Monocytes Relative 11 %   Eosinophils Relative 1 %   Basophils Relative 0 %   Neutro Abs 15.8 (H) 1.4 - 6.5 K/uL   Lymphs Abs 1.8 1.0 - 3.6 K/uL   Monocytes Absolute 2.2 (H) 0.2 - 0.9 K/uL   Eosinophils Absolute 0.2 0 - 0.7 K/uL   Basophils Absolute 0.0 0 - 0.1 K/uL   RBC Morphology MIXED RBC POPULATION     Comment: Performed at [facility], [address] [place], [place] [address]  Comprehensive metabolic panel     Status: Abnormal   Collection Time: 03/17/18 10:50 AM  Result Value Ref Range   Sodium 136 135 - 145 mmol/L   Potassium 5.9 (H) 3.5 - 5.1 mmol/L   Chloride 99 (L) 101 - 111 mmol/L   CO2 26 22 - 32 mmol/L   Glucose, Bld 140 (H) 65 - 99 mg/dL   BUN 15 6 - 20 mg/dL   Creatinine, Ser 0.93 0.44 - 1.00 mg/dL   Calcium 8.7 (L) 8.9 - 10.3 mg/dL   Total Protein 6.4 (L) 6.5 - 8.1 g/dL   Albumin 3.2 (L) 3.5 - 5.0 g/dL   AST 41 15 - 41 U/L   ALT 37 14 - 54 U/L   Alkaline Phosphatase 188 (H) 38 - 126 U/L   Total Bilirubin 0.4 0.3 - 1.2 mg/dL   GFR calc non Af Amer 60 (L) >60 mL/min   GFR calc Af Amer >60 >60 mL/min    Comment: (NOTE) The eGFR has been calculated using the CKD EPI equation. This calculation has not been validated in all clinical situations. eGFR's persistently <60 mL/min signify possible Chronic Kidney Disease.    Anion gap 11 5 - 15    Comment: Performed at [facility], [address] [place], [place] [address]  CBC with Differential/Platelet     Status: Abnormal    Collection Time: 03/18/18  4:20 AM  Result Value Ref Range   WBC 16.3 (H) 3.6 - 11.0 K/uL   RBC 3.57 (L) 3.80 - 5.20 MIL/uL   Hemoglobin 10.4 (L) 12.0 - 16.0 g/dL   HCT 32.5 (L) 35.0 - 47.0 %   MCV 91.0 80.0 - 100.0 fL   MCH 29.1 26.0 - 34.0 pg   MCHC 32.0 32.0 - 36.0 g/dL   RDW 20.7 (H) 11.5 - 14.5 %   Platelets 353 150 - 440 K/uL    Comment: COUNT MAY BE INACCURATE DUE TO FIBRIN CLUMPS.   Neutrophils Relative % 75 %  Lymphocytes Relative 12 %   Monocytes Relative 10 %   Eosinophils Relative 2 %   Basophils Relative 1 %   Neutro Abs 12.2 (H) 1.4 - 6.5 K/uL   Lymphs Abs 2.0 1.0 - 3.6 K/uL   Monocytes Absolute 1.6 (H) 0.2 - 0.9 K/uL   Eosinophils Absolute 0.3 0 - 0.7 K/uL   Basophils Absolute 0.2 (H) 0 - 0.1 K/uL   WBC Morphology MILD LEFT SHIFT (1-5% METAS, OCC MYELO, OCC BANDS)    Smear Review MORPHOLOGY UNREMARKABLE     Comment: Performed at [facility], [address], [place], [place] [address]  Basic metabolic panel     Status: Abnormal   Collection Time: 03/18/18  4:20 AM  Result Value Ref Range   Sodium 137 135 - 145 mmol/L   Potassium 4.9 3.5 - 5.1 mmol/L   Chloride 100 (L) 101 - 111 mmol/L   CO2 31 22 - 32 mmol/L   Glucose, Bld 78 65 - 99 mg/dL   BUN 16 6 - 20 mg/dL   Creatinine, Ser 0.63 0.44 - 1.00 mg/dL   Calcium 8.1 (L) 8.9 - 10.3 mg/dL   GFR calc non Af Amer >60 >60 mL/min   GFR calc Af Amer >60 >60 mL/min    Comment: (NOTE) The eGFR has been calculated using the CKD EPI equation. This calculation has not been validated in all clinical situations. eGFR's persistently <60 mL/min signify possible Chronic Kidney Disease.    Anion gap 6 5 - 15    Comment: Performed at [facility], [address] [place], [place] [address]    Assessment/Plan:  Raynaud's disease without gangrene Easily noticeable and has been fairly significant for many years.  Already on a calcium channel blocker and aspirin.  Hypertension blood pressure control  important in reducing the progression of atherosclerotic disease. On appropriate oral medications.   Mixed hyperlipidemia lipid control important in reducing the progression of atherosclerotic disease. Continue statin therapy   Lower extremity edema I have had a long discussion with the patient regarding swelling and why it  causes symptoms.  Patient will begin wearing graduated compression stockings class 1 (20-30 mmHg) on a daily basis.  She reports having tried these in the past but having some sort of allergic reaction to them and having to stop their use. The patient will  beginning wearing the stockings first thing in the morning and removing them in the evening. The patient is instructed specifically not to sleep in the stockings.   In addition, behavioral modification will be initiated.  This will include frequent elevation, use of over the counter pain medications and exercise such as walking.  I have reviewed systemic causes for chronic edema such as liver, kidney and cardiac etiologies.  The patient denies problems with these organ systems.    Consideration for a lymph pump will also be made based upon the effectiveness of conservative therapy.  This would help to improve the edema control and prevent sequela such as ulcers and infections   Patient should undergo duplex ultrasound of the venous system to ensure that DVT or reflux is not present.  The patient will follow-up with me after the ultrasound.        Leotis Pain 04/27/2018, 1:46 PM   This note was created with Dragon medical transcription system.  Any errors from dictation are unintentional.

## 2018-04-27 NOTE — Patient Instructions (Signed)
Edema Edema is an abnormal buildup of fluids in your bodytissues. Edema is somewhatdependent on gravity to pull the fluid to the lowest place in your body. That makes the condition more common in the legs and thighs (lower extremities). Painless swelling of the feet and ankles is common and becomes more likely as you get older. It is also common in looser tissues, like around your eyes. When the affected area is squeezed, the fluid may move out of that spot and leave a dent for a few moments. This dent is called pitting. What are the causes? There are many possible causes of edema. Eating too much salt and being on your feet or sitting for a long time can cause edema in your legs and ankles. Hot weather may make edema worse. Common medical causes of edema include:  Heart failure.  Liver disease.  Kidney disease.  Weak blood vessels in your legs.  Cancer.  An injury.  Pregnancy.  Some medications.  Obesity.  What are the signs or symptoms? Edema is usually painless.Your skin may look swollen or shiny. How is this diagnosed? Your health care provider may be able to diagnose edema by asking about your medical history and doing a physical exam. You may need to have tests such as X-rays, an electrocardiogram, or blood tests to check for medical conditions that may cause edema. How is this treated? Edema treatment depends on the cause. If you have heart, liver, or kidney disease, you need the treatment appropriate for these conditions. General treatment may include:  Elevation of the affected body part above the level of your heart.  Compression of the affected body part. Pressure from elastic bandages or support stockings squeezes the tissues and forces fluid back into the blood vessels. This keeps fluid from entering the tissues.  Restriction of fluid and salt intake.  Use of a water pill (diuretic). These medications are appropriate only for some types of edema. They pull fluid  out of your body and make you urinate more often. This gets rid of fluid and reduces swelling, but diuretics can have side effects. Only use diuretics as directed by your health care provider.  Follow these instructions at home:  Keep the affected body part above the level of your heart when you are lying down.  Do not sit still or stand for prolonged periods.  Do not put anything directly under your knees when lying down.  Do not wear constricting clothing or garters on your upper legs.  Exercise your legs to work the fluid back into your blood vessels. This may help the swelling go down.  Wear elastic bandages or support stockings to reduce ankle swelling as directed by your health care provider.  Eat a low-salt diet to reduce fluid if your health care provider recommends it.  Only take medicines as directed by your health care provider. Contact a health care provider if:  Your edema is not responding to treatment.  You have heart, liver, or kidney disease and notice symptoms of edema.  You have edema in your legs that does not improve after elevating them.  You have sudden and unexplained weight gain. Get help right away if:  You develop shortness of breath or chest pain.  You cannot breathe when you lie down.  You develop pain, redness, or warmth in the swollen areas.  You have heart, liver, or kidney disease and suddenly get edema.  You have a fever and your symptoms suddenly get worse. This information is   not intended to replace advice given to you by your health care provider. Make sure you discuss any questions you have with your health care provider. Document Released: 11/24/2005 Document Revised: 05/01/2016 Document Reviewed: 09/16/2013 Elsevier Interactive Patient Education  2017 [employer] Inc.  

## 2018-04-27 NOTE — Addendum Note (Signed)
Addended by: Algernon Huxley on: 04/27/2018 01:56 PM   Modules accepted: Orders

## 2018-04-27 NOTE — Assessment & Plan Note (Signed)
I have had a long discussion with the patient regarding swelling and why it  causes symptoms.  Patient will begin wearing graduated compression stockings class 1 (20-30 mmHg) on a daily basis.  She reports having tried these in the past but having some sort of allergic reaction to them and having to stop their use. The patient will  beginning wearing the stockings first thing in the morning and removing them in the evening. The patient is instructed specifically not to sleep in the stockings.   In addition, behavioral modification will be initiated.  This will include frequent elevation, use of over the counter pain medications and exercise such as walking.  I have reviewed systemic causes for chronic edema such as liver, kidney and cardiac etiologies.  The patient denies problems with these organ systems.    Consideration for a lymph pump will also be made based upon the effectiveness of conservative therapy.  This would help to improve the edema control and prevent sequela such as ulcers and infections   Patient should undergo duplex ultrasound of the venous system to ensure that DVT or reflux is not present.  The patient will follow-up with me after the ultrasound.

## 2018-04-27 NOTE — Assessment & Plan Note (Signed)
blood pressure control important in reducing the progression of atherosclerotic disease. On appropriate oral medications.  

## 2018-04-27 NOTE — Assessment & Plan Note (Signed)
lipid control important in reducing the progression of atherosclerotic disease. Continue statin therapy  

## 2018-06-25 ENCOUNTER — Ambulatory Visit (INDEPENDENT_AMBULATORY_CARE_PROVIDER_SITE_OTHER): Payer: Medicare Other | Admitting: Vascular Surgery

## 2018-06-25 ENCOUNTER — Encounter (INDEPENDENT_AMBULATORY_CARE_PROVIDER_SITE_OTHER): Payer: Medicare Other

## 2018-06-25 ENCOUNTER — Encounter (INDEPENDENT_AMBULATORY_CARE_PROVIDER_SITE_OTHER): Payer: Self-pay | Admitting: Vascular Surgery

## 2018-06-25 ENCOUNTER — Ambulatory Visit (INDEPENDENT_AMBULATORY_CARE_PROVIDER_SITE_OTHER): Payer: Medicare Other

## 2018-06-25 ENCOUNTER — Encounter

## 2018-06-25 VITALS — BP 132/71 | HR 88 | Resp 16 | Ht 62.0 in | Wt 197.0 lb

## 2018-06-25 DIAGNOSIS — E782 Mixed hyperlipidemia: Secondary | ICD-10-CM | POA: Diagnosis not present

## 2018-06-25 DIAGNOSIS — I83893 Varicose veins of bilateral lower extremities with other complications: Secondary | ICD-10-CM | POA: Diagnosis not present

## 2018-06-25 DIAGNOSIS — I25118 Atherosclerotic heart disease of native coronary artery with other forms of angina pectoris: Secondary | ICD-10-CM

## 2018-06-25 DIAGNOSIS — R600 Localized edema: Secondary | ICD-10-CM

## 2018-06-25 DIAGNOSIS — I7300 Raynaud's syndrome without gangrene: Secondary | ICD-10-CM | POA: Diagnosis not present

## 2018-06-25 DIAGNOSIS — I10 Essential (primary) hypertension: Secondary | ICD-10-CM

## 2018-06-25 NOTE — Progress Notes (Signed)
MRN : [id number]  Erin [name] is a 72 y.o. ([birth year]) female who presents with chief complaint of  Chief Complaint  Patient presents with  . Follow-up    pt conv bi ven reflux  .  History of Present Illness: Patient returns today in follow up of leg pain and swelling.  Her legs are basically the same as they were at her initial visit about 2 months ago.  She really cannot get on the compression stockings or take them off secondary to her arthritis and problems with her hands.  She has been trying to elevate them and increase her activity.  Her venous duplex today demonstrates no evidence of DVT or superficial thrombophlebitis.  There is reflux in the great saphenous vein bilaterally.  Current Outpatient Medications  Medication Sig Dispense Refill  . acetaminophen (TYLENOL) 325 MG tablet Take 650 mg by mouth 4 (four) times daily.    Marland Kitchen ALPRAZolam (XANAX) 0.5 MG tablet Take 1 tablet (0.5 mg total) by mouth 4 (four) times daily. 120 tablet 0  . alum & mag hydroxide-simeth (MAALOX PLUS) 400-400-40 MG/5ML suspension Take 30 mLs by mouth every 4 (four) hours as needed.    . Amino Acids-Protein Hydrolys (FEEDING SUPPLEMENT, PRO-STAT SUGAR FREE 64,) LIQD Take 30 mLs by mouth 2 (two) times daily between meals.    Marland Kitchen amLODipine (NORVASC) 10 MG tablet Take 5 mg by mouth daily. 1/2 tab    . aspirin 325 MG EC tablet Take 325 mg by mouth 2 (two) times daily.    . bisacodyl (DULCOLAX) 10 MG suppository Place 10 mg rectally daily as needed.    . cetirizine (ZYRTEC) 10 MG tablet Take 10 mg by mouth daily as needed for allergies.     Marland Kitchen dexlansoprazole (DEXILANT) 60 MG capsule Take 1 capsule (60 mg total) by mouth daily. 30 capsule 0  . ezetimibe (ZETIA) 10 MG tablet TAKE ONE TABLET BY MOUTH EVERY DAY 30 tablet 11  . famotidine (PEPCID) 20 MG tablet Take 20 mg by mouth daily.    Marland Kitchen FLUoxetine (PROZAC) 20 MG capsule Take 20 mg by mouth daily.     . furosemide (LASIX) 20 MG tablet Take 20mg s daily as  needed for swelling    . furosemide (LASIX) 40 MG tablet Take 40 mg by mouth daily.  90 tablet 3  . gabapentin (NEURONTIN) 300 MG capsule Take 300 mg by mouth 3 (three) times daily.    Marland Kitchen guaifenesin (ROBITUSSIN) 100 MG/5ML syrup Take 200 mg by mouth every 4 (four) hours as needed for cough.    . hydrocortisone cream 1 % Apply 1 application topically 2 (two) times daily. liberal amounts to both lower legs.    . hydroxychloroquine (PLAQUENIL) 200 MG tablet Take 200 mg by mouth 2 (two) times daily.     Marland Kitchen ipratropium-albuterol (DUONEB) 0.5-2.5 (3) MG/3ML SOLN Take 3 mLs by nebulization every 4 (four) hours as needed (wheezing, dyspnea).    . latanoprost (XALATAN) 0.005 % ophthalmic solution Place 1 drop into both eyes at bedtime.     . magnesium hydroxide (MILK OF MAGNESIA) 400 MG/5ML suspension Take 30 mLs by mouth every 4 (four) hours as needed. Constipation/ no BM for 2 days    . Multiple Vitamin (MULTI-VITAMINS) TABS Take 1 tablet by mouth daily.     . Nutritional Supplements (ENSURE ENLIVE PO) Take 1 Bottle by mouth 2 (two) times daily between meals.    . Omega-3 Fatty Acids (FISH OIL) 1000 MG CAPS Take  1 capsule by mouth 2 (two) times daily.     . polyethylene glycol (MIRALAX / GLYCOLAX) packet Take 17 g by mouth daily.     . predniSONE (DELTASONE) 5 MG tablet Take 5 mg by mouth 2 (two) times daily with a meal.     . simvastatin (ZOCOR) 20 MG tablet Take 20 mg by mouth at bedtime.    . sorbitol 70 % solution Take 30 mLs by mouth every 2 (two) hours as needed (constipation). hours until large BM. Assess BM for GI bleed. hgb low- constipation    . triamcinolone cream (KENALOG) 0.5 % Apply topically.    Marland Kitchen zolpidem (AMBIEN) 5 MG tablet Take 1 tablet (5 mg total) by mouth at bedtime as needed. 14 tablet 0  . ferrous sulfate 325 (65 FE) MG tablet Take 1 tablet (325 mg total) by mouth 3 (three) times daily after meals. (Patient not taking: Reported on 04/27/2018) 60 tablet 3  . oxyCODONE (OXY  IR/ROXICODONE) 5 MG immediate release tablet Take 1 tablet (5 mg total) by mouth every 4 (four) hours as needed for moderate pain (pain score 4-6). (Patient not taking: Reported on 04/27/2018) 120 tablet 0   No current facility-administered medications for this visit.     Past Medical History:  Diagnosis Date  . Anemia    h/o  . Cancer ([place])    melanoma x 2 the last one was of her leg and surgeon removed it  . Colon polyp   . Coronary artery calcification   . Coronary artery disease   . DDD (degenerative disc disease), lumbar   . Diverticulosis 01/20/2017  . Dysphagia   . Dyspnea    with exertion-unable to walk a mile without getting sob- dr sparks set pt up to see Dr Rockey Situ after 11-19-16 surgery  . Environmental allergies   . Esophageal dysmotility   . Fibrocystic breast disease   . GERD (gastroesophageal reflux disease)   . Heart murmur    h/o   . Hyperlipidemia    unspecified  . Hypertension   . Mitral regurgitation   . Monilial esophagitis ([place]) 01/20/2017  . Obstipation   . Osteopenia   . Peptic ulcer disease   . Pulmonary fibrosis ([place])    per Dr Raul Del  . RA (rheumatoid arthritis) ([place])   . Raynaud's disease   . Redundant colon 01/20/2017  . Scleroderma ([place])   . Telangiectasia of colon   . Tubular adenoma of colon 01/20/2017   unspecified  . Vulvar dysplasia     Past Surgical History:  Procedure Laterality Date  . ABDOMINAL HYSTERECTOMY    . BREAST CYST EXCISION Left 20+ years ago   No scar visible  . BREAST LUMPECTOMY Right   . CARPAL TUNNEL RELEASE Bilateral   . COLON SURGERY     colon polyp   . COLONOSCOPY  09/30/2011   tubular adenoma rtm  . COLONOSCOPY     05/02/2003, 04/10/2000  . COLONOSCOPY WITH PROPOFOL N/A 01/20/2017   Procedure: COLONOSCOPY WITH PROPOFOL;  Surgeon: Lollie Sails, MD;  Location: [facility] ENDOSCOPY;  Service: Endoscopy;  Laterality: N/A;  . ESOPHAGOGASTRODUODENOSCOPY    . ESOPHAGOGASTRODUODENOSCOPY     09/30/2011, 04/10/2000 ,  no repeat rtm  . ESOPHAGOGASTRODUODENOSCOPY N/A 03/02/2018   Procedure: ESOPHAGOGASTRODUODENOSCOPY (EGD);  Surgeon: Virgel Manifold, MD;  Location[facility] ENDOSCOPY;  Service: Endoscopy;  Laterality: N/A;  . ESOPHAGOGASTRODUODENOSCOPY (EGD) WITH PROPOFOL N/A 01/20/2017   Procedure: ESOPHAGOGASTRODUODENOSCOPY (EGD) WITH PROPOFOL;  Surgeon: Lollie Sails, MD;  Location: [facility] ENDOSCOPY;  Service: Endoscopy;  Laterality: N/A;  . EXCISION HYDRADENITIS LABIA Left 11/19/2016   Procedure: EXCISION LABIAL MASS;  Surgeon: Robert Bellow, MD;  Location: [facility] ORS;  Service: General;  Laterality: Left;  . EYE SURGERY Bilateral    cataracts  . HEEL SPUR EXCISION    . HIP ARTHROPLASTY Left 02/23/2018   Procedure: ARTHROPLASTY BIPOLAR HIP (HEMIARTHROPLASTY);  Surgeon: Thornton Park, MD;  Location: [facility] ORS;  Service: Orthopedics;  Laterality: Left;  Marland Kitchen MASS EXCISION Left 11/19/2016   Procedure: EXCISION LEFT THIGH MELANOMA;  Surgeon: Robert Bellow, MD;  Location: [facility] ORS;  Service: General;  Laterality: Left;  . SENTINEL NODE BIOPSY Left 11/19/2016   Procedure: INGUINAL SENTINEL NODE BIOPSY;  Surgeon: Robert Bellow, MD;  Location: [facility] ORS;  Service: General;  Laterality: Left;  . UPPER ESOPHAGEAL ENDOSCOPIC ULTRASOUND (EUS) N/A 01/29/2017   Procedure: UPPER ESOPHAGEAL ENDOSCOPIC ULTRASOUND (EUS);  Surgeon: Holly Bodily, MD;  Location: [facility] ENDOSCOPY;  Service: Endoscopy;  Laterality: N/A;         Family History  Problem Relation Age of Onset  . Endometrial cancer Mother 74  . Hypertension Mother   . Osteoporosis Mother   . Bladder Cancer Father   . COPD Father   . Cerebral palsy Sister      Social History Social History       Tobacco Use  . Smoking status: Never Smoker  . Smokeless tobacco: Never Used  Substance Use Topics  . Alcohol use: No  . Drug use: No     Allergies  Allergen Reactions  . Remicade [Infliximab] Shortness Of Breath and Itching    . Sulfa Antibiotics Other (See Comments)    Other Reaction: tounge cracked  . Actonel [Risedronate Sodium] Other (See Comments)  . Fosamax [Alendronate Sodium]     GI Bleed   . Procardia [Nifedipine] Hives  . Sulfur Swelling  . Wellbutrin [Bupropion] Anxiety      REVIEW OF SYSTEMS (Negative unless checked)  Constitutional: [] Weight loss  [] Fever  [] Chills Cardiac: [] Chest pain   [] Chest pressure   [] Palpitations   [] Shortness of breath when laying flat   [] Shortness of breath at rest   [] Shortness of breath with exertion. Vascular:  [x] Pain in legs with walking   [x] Pain in legs at rest   [] Pain in legs when laying flat   [] Claudication   [] Pain in feet when walking  [] Pain in feet at rest  [] Pain in feet when laying flat   [] History of DVT   [] Phlebitis   [] Swelling in legs   [] Varicose veins   [] Non-healing ulcers Pulmonary:   [] Uses home oxygen   [] Productive cough   [] Hemoptysis   [] Wheeze  [] COPD   [] Asthma Neurologic:  [] Dizziness  [] Blackouts   [] Seizures   [] History of stroke   [] History of TIA  [] Aphasia   [] Temporary blindness   [] Dysphagia   [] Weakness or numbness in arms   [] Weakness or numbness in legs Musculoskeletal:  [x] Arthritis   [] Joint swelling   [] Joint pain   [] Low back pain Hematologic:  [] Easy bruising  [] Easy bleeding   [] Hypercoagulable state   [] Anemic  [] Hepatitis Gastrointestinal:  [] Blood in stool   [] Vomiting blood  [x] Gastroesophageal reflux/heartburn   [] Abdominal pain Genitourinary:  [] Chronic kidney disease   [] Difficult urination  [] Frequent urination  [] Burning with urination   [] Hematuria Skin:  [] Rashes   [] Ulcers   [] Wounds Psychological:  [x] History of anxiety   []  History of major depression.  Physical Examination  BP 132/71 (BP Location: Right Arm)   Pulse 88   Resp 16   Ht 5\' 2"  (1.575 m)   Wt 197 lb (89.4 kg)   BMI 36.03 kg/m  Gen:  WD/WN, NAD Head: [place]/AT, No temporalis wasting. Ear/Nose/Throat: Hearing grossly  intact, nares w/o erythema or drainage Eyes: Conjunctiva clear. Sclera non-icteric Neck: Supple.  Trachea midline Pulmonary:  Good air movement, no use of accessory muscles.  Cardiac: RRR, no JVD Vascular:  Vessel Right Left  Radial Palpable Palpable                          PT  trace palpable  trace palpable  DP 1+ Palpable 2+ Palpable    Musculoskeletal: Walks with a walker.  Arthritic changes to her hand.  Moderate venous congestion bilaterally with 1+ bilateral lower extremity edema Neurologic: Sensation grossly intact in extremities.  Symmetrical.  Speech is fluent.  Psychiatric: Judgment intact, Mood & affect appropriate for pt's clinical situation. Dermatologic: No rashes or ulcers noted.  No cellulitis or open wounds.       Labs No results found for this or any previous visit (from the past 2160 hour(s)).  Radiology No results found.  Assessment/Plan Raynaud's disease without gangrene Easily noticeable and has been fairly significant for many years.  Already on a calcium channel blocker and aspirin.  Hypertension blood pressure control important in reducing the progression of atherosclerotic disease. On appropriate oral medications.   Mixed hyperlipidemia lipid control important in reducing the progression of atherosclerotic disease. Continue statin therapy   Varicose veins of leg with swelling, bilateral Recommend  I have reviewed my previous  discussion with the patient regarding  varicose veins and why they cause symptoms. Patient can not effectively wear compression stockings.    In addition, behavioral modification including elevation during the day was again discussed and this will continue.  The patient has utilized over the counter pain medications and has been exercising.  However, at this time conservative therapy has not alleviated the patient's symptoms of leg pain and swelling  Recommend: laser ablation of the right and  left great  saphenous veins to eliminate the symptoms of pain and swelling of the lower extremities caused by the severe superficial venous reflux disease.     Leotis Pain, MD  06/25/2018 2:45 PM    This note was created with Dragon medical transcription system.  Any errors from dictation are purely unintentional

## 2018-06-25 NOTE — Patient Instructions (Signed)
Nonsurgical Procedures for Varicose Veins Various nonsurgical procedures can be used to treat varicose veins. Varicose veins are swollen, twisted veins that are visible under the skin. They occur most often in the legs. These veins may appear blue and bulging. Varicose veins are caused by damage to the valves in veins. All veins have a valve that makes blood flow in only one direction. If a valve gets weak or damaged, blood can pool and cause varicose veins. You may need a procedure to treat your varicose veins if they are causing symptoms or complications, or if lifestyle changes have not helped. These procedures can reduce pain, aching, and the risk of bleeding and blood clots. They can also improve the way the affected area looks (cosmetic appearance). The three common nonsurgical procedures are:  Sclerotherapy. A chemical is injected to close off a vein.  Laser treatment. Light energy is applied to close off the vein.  Radiofrequency vein ablation. Electrical energy is used to produce heat that closes off the vein.  Your health care provider will discuss the method that is best for you based on your condition. Tell a health care provider about:  Any allergies you have.  All medicines you are taking, including vitamins, herbs, eye drops, creams, and over-the-counter medicines.  Any problems you or family members have had with anesthetic medicines.  Any blood disorders you have.  Any surgeries you have had.  Any medical conditions you have.  Whether you are pregnant or may be pregnant. What are the risks? Generally, this is a safe procedure. However, problems may occur, including:  Damage to nearby nerves, tissues, or veins.  Skin irritation, sores, or dark spots.  Numbness.  Clotting.  Infection.  Allergic reactions to medicines.  Scarring.  Leg swelling.  Need for additional treatments.  Bruising.  What happens before the procedure?  Ask your health care  provider about: ? Changing or stopping your regular medicines. This is especially important if you are taking diabetes medicines or blood thinners. ? Taking over-the-counter medicines, vitamins, herbs, and supplements. ? Taking medicines such as aspirin and ibuprofen. These medicines can thin your blood. Do not take these medicines unless your health care provider tells you to take them.  You may have an exam or testing. This can include a tests to: ? Check for clots and check blood flow using sound waves (Doppler ultrasound). ? Observe how blood flows through your veins by injecting a dye that outlines your veins on X-rays (angiogram). This test is used in rare cases. What happens during the procedure? One of the following procedures will be performed: Sclerotherapy This procedure is often used for small to medium veins.  A chemical (sclerosant) that irritates the lining of the vein will be injected into the vein. This will cause the varicose vein to be closed off. Sclerosants in different amounts and strengths can be used, depending on the size and location of the vein.  All of the varicose vein sites will be injected. You may need more than one treatment because new varicose veins may develop, or more than one injection may be needed for each varicose vein.  Laser treatment There are two ways that lasers are used to treat varicose veins:  Light energy from a laser may be directed onto the vein through the skin.  A needle may be used to pass a thin laser catheter into the vein to cause it to close.  You may need more than one treatment if the vein re-opens.   In some cases, laser treatment may be combined with sclerotherapy. Radiofrequency vein ablation  You will be given a medicine that numbs the area (local anesthetic).  A small incision will be made near the varicose vein.  A thin tube (catheter) will be threaded into your vein.  The tip of the catheter will deploy  electrodes.  The electrodes will deliver electrical energy to produce heat that closes off the vein. What happens after the procedure?  A bandage (dressing) may be used to cover the injection site or incisions.  You may have to wear compression stockings. These stockings help to prevent blood clots and reduce swelling in your legs.  Return to your normal activities as told by your health care provider. Summary  Varicose veins are swollen, twisted veins that are visible under the skin. They occur most often in the legs.  Various procedures can be used to treat varicose veins. You may need a procedure to treat your varicose veins if they are causing symptoms or complications, or if lifestyle changes have not helped.  Your health care provider will discuss the method that is best for you based on your condition. This information is not intended to replace advice given to you by your health care provider. Make sure you discuss any questions you have with your health care provider. Document Released: 03/06/2017 Document Revised: 03/06/2017 Document Reviewed: 03/06/2017 Elsevier Interactive Patient Education  2018 [employer]  

## 2018-06-25 NOTE — Assessment & Plan Note (Signed)
Recommend  I have reviewed my previous  discussion with the patient regarding  varicose veins and why they cause symptoms. Patient can not effectively wear compression stockings.    In addition, behavioral modification including elevation during the day was again discussed and this will continue.  The patient has utilized over the counter pain medications and has been exercising.  However, at this time conservative therapy has not alleviated the patient's symptoms of leg pain and swelling  Recommend: laser ablation of the right and  left great saphenous veins to eliminate the symptoms of pain and swelling of the lower extremities caused by the severe superficial venous reflux disease.

## 2018-07-05 ENCOUNTER — Ambulatory Visit (INDEPENDENT_AMBULATORY_CARE_PROVIDER_SITE_OTHER): Payer: Medicare Other | Admitting: Vascular Surgery

## 2018-07-05 ENCOUNTER — Encounter

## 2018-07-05 ENCOUNTER — Encounter (INDEPENDENT_AMBULATORY_CARE_PROVIDER_SITE_OTHER): Payer: Medicare Other

## 2018-07-23 ENCOUNTER — Encounter (INDEPENDENT_AMBULATORY_CARE_PROVIDER_SITE_OTHER): Payer: Self-pay | Admitting: Vascular Surgery

## 2018-07-23 ENCOUNTER — Other Ambulatory Visit (INDEPENDENT_AMBULATORY_CARE_PROVIDER_SITE_OTHER): Payer: Medicare Other | Admitting: Vascular Surgery

## 2018-07-23 ENCOUNTER — Ambulatory Visit (INDEPENDENT_AMBULATORY_CARE_PROVIDER_SITE_OTHER): Payer: Medicare Other | Admitting: Vascular Surgery

## 2018-07-23 VITALS — BP 119/75 | HR 83 | Resp 16 | Ht 61.5 in | Wt 194.0 lb

## 2018-07-23 DIAGNOSIS — I83891 Varicose veins of right lower extremities with other complications: Secondary | ICD-10-CM

## 2018-07-23 DIAGNOSIS — I83893 Varicose veins of bilateral lower extremities with other complications: Secondary | ICD-10-CM

## 2018-07-23 DIAGNOSIS — I83892 Varicose veins of left lower extremities with other complications: Secondary | ICD-10-CM | POA: Diagnosis not present

## 2018-07-23 NOTE — Progress Notes (Signed)
Varicose veins of leg with swelling, bilateral     The patient's left lower extremity was sterilely prepped and draped. The ultrasound machine was used to visualize the saphenous vein throughout its course. A segment in the mid to upper calf was selected for access. The saphenous vein was accessed with a mild amount of difficulty using ultrasound guidance with a micro puncture needle. A micro puncture wire and sheath were then placed. A 0.018 wire was placed beyond the saphenofemoral junction through the sheath and the micro puncture sheath was removed. The 65 cm sheath was then placed over the wire and the wire and dilator were removed. The laser fiber was placed through the sheath and its tip was placed approximately 5 cm below the saphenofemoral junction. Tumescent anesthesia was then created with a dilute lidocaine solution. Laser energy was then delivered with constant withdrawal of the sheath and laser fiber. Approximately 1414 Joules of energy were delivered over a length of 36 cm using the 1470 Hz VenaCure machine at [employer]. Sterile dressings were placed. The patient tolerated the procedure well without complications.

## 2018-07-26 ENCOUNTER — Ambulatory Visit (INDEPENDENT_AMBULATORY_CARE_PROVIDER_SITE_OTHER): Payer: Medicare Other

## 2018-07-26 DIAGNOSIS — I83892 Varicose veins of left lower extremities with other complications: Secondary | ICD-10-CM | POA: Diagnosis not present

## 2018-07-26 DIAGNOSIS — I83893 Varicose veins of bilateral lower extremities with other complications: Secondary | ICD-10-CM

## 2018-07-26 DIAGNOSIS — I83891 Varicose veins of right lower extremities with other complications: Secondary | ICD-10-CM

## 2018-08-06 ENCOUNTER — Ambulatory Visit (INDEPENDENT_AMBULATORY_CARE_PROVIDER_SITE_OTHER): Payer: Medicare Other | Admitting: Vascular Surgery

## 2018-08-06 ENCOUNTER — Encounter (INDEPENDENT_AMBULATORY_CARE_PROVIDER_SITE_OTHER): Payer: Self-pay | Admitting: Vascular Surgery

## 2018-08-06 VITALS — BP 121/64 | HR 74 | Resp 17 | Ht 64.0 in | Wt 194.0 lb

## 2018-08-06 DIAGNOSIS — I83891 Varicose veins of right lower extremities with other complications: Secondary | ICD-10-CM

## 2018-08-06 DIAGNOSIS — I83893 Varicose veins of bilateral lower extremities with other complications: Secondary | ICD-10-CM

## 2018-08-06 NOTE — Progress Notes (Signed)
Varicose veins of leg with swelling, bilateral     The patient's right lower extremity was sterilely prepped and draped. The ultrasound machine was used to visualize the saphenous vein throughout its course. A segment in the mid to upper calf was selected for access. The saphenous vein was accessed without difficulty using ultrasound guidance with a micro puncture needle. A micro puncture wire and sheath were then placed. A 0.018 wire was placed beyond the saphenofemoral junction through the sheath and the micro puncture sheath was removed. The 65 cm sheath was then placed over the wire and the wire and dilator were removed. The laser fiber was placed through the sheath and its tip was placed approximately 4-5 cm below the saphenofemoral junction. Tumescent anesthesia was then created with a dilute lidocaine solution. Laser energy was then delivered with constant withdrawal of the sheath and laser fiber. Approximately 1294 Joules of energy were delivered over a length of 37 cm using the 1470 Hz VenaCure machine at [employer]. Sterile dressings were placed. The patient tolerated the procedure well without complications.

## 2018-08-10 ENCOUNTER — Other Ambulatory Visit: Payer: Self-pay | Admitting: Internal Medicine

## 2018-08-10 ENCOUNTER — Ambulatory Visit (INDEPENDENT_AMBULATORY_CARE_PROVIDER_SITE_OTHER): Payer: Medicare Other

## 2018-08-10 DIAGNOSIS — I83893 Varicose veins of bilateral lower extremities with other complications: Secondary | ICD-10-CM | POA: Diagnosis not present

## 2018-08-10 DIAGNOSIS — Z1231 Encounter for screening mammogram for malignant neoplasm of breast: Secondary | ICD-10-CM

## 2018-08-13 ENCOUNTER — Telehealth: Payer: Self-pay

## 2018-08-13 NOTE — Telephone Encounter (Signed)
Attempted to call Ms. Bunkley regarding appointment 9/25. We would like to move her appointment to the morning hours due to Dr. Theora Gianotti being out of the office in the afternoon. No answer/no voicemail. Oncology Nurse Navigator Documentation  Navigator Location: [facility] (08/13/18 1000)   )Navigator Encounter Type: Telephone (08/13/18 1000) Telephone: Lahoma Crocker Call;Appt Confirmation/Clarification (08/13/18 1000)                                                  Time Spent with Patient: 15 (08/13/18 1000)

## 2018-08-31 NOTE — Progress Notes (Signed)
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 female s/p TAH who is seen in consultation from Dr. Kenton Kingfisher for [place]. Dr. Bary Castilla initially referred Erin [name] to Dr. Kenton Kingfisher.    She has itching along the scar line and she self treats with topical steroids twice a week which controls her symptoms.   She has had an eventful year with fall in March and suffered a hip fracture. She had 2 pints of blood; subsequently diagnosed with pneumonia. She also had bilateral leg surgery for vein related issues. Postoperatively she had leg infections.  She has a slipped disk and they will not offer her surgery b/c she is such a high surgical risk.    Gynecologic Oncology History Erin [name] is a pleasant female s/p TAH who is seen in consultation from Dr. Kenton Kingfisher for [place]. Dr. Bary Castilla initially referred Erin [name] to Dr. Kenton Kingfisher.   The patient underwent left leg wide excision with sentinel node biopsy for melanoma and excision left labial nodule on 11/19/2016. Based on pathology report the vulvar nodule was 1 x 0.8 cm and the entire specimen removed was 1.5 x 0.9 x 0.3 cm. She complains of persistent irritation/itching and has been using clobesetrol ointment for years.    Data Reviewed DIAGNOSIS:  A. SKIN AND SOFT TISSUE, LEFT THIGH; EXCISION:  - NEGATIVE FOR RESIDUAL MELANOMA.  - BIOPSY SITE CHANGES.  - ALL MARGINS APPEAR CLEAR.   B. SENTINEL LYMPH NODE #1, LEFT INGUINAL; EXCISION:  - NEGATIVE FOR MALIGNANCY BY IMMUNOHISTOCHEMISTRY FOR SOX10.   C. LEFT LABIAL NODULE; EXCISION:  - HIGH-GRADE SQUAMOUS INTRAEPITHELIAL LESION (HSIL, VIN 3).   Comment:  In part C, there is HSIL 0.5 mm from a lateral margin. HSIL is present  in one tip section, so this margin is very close also. Complete  gynecologic exam for other HPV-associated lesions and close follow-up   are advised.  She recently had colonoscopy and EGD on 01/20/2017; pathology colonic polyps and GE junction lesion inflamed granulation tissue Barrett's esophagus can not be excluded. Noted to have esophageal lesion and endoscopic ultrasound ordered on 01/29/2017.  01/28/2017 she had vulvar colposcopy and biopsy that demonstrated  DIAGNOSIS:  A. VULVA, LEFT, 5:00:  - VULVAR TISSUE WITH SCANT INFLAMMATION.  - NEGATIVE FOR DYSPLASIA AND MALIGNANCY.  - MULTIPLE DEEPER LEVELS WERE EXAMINED.   Of note she does not have a history of abnormal Pap, genital warts, or other gynecologic related issues.   Problem List: Patient Active Problem List   Diagnosis Date Noted  . Varicose veins of leg with swelling, bilateral 06/25/2018  . Hypertension 04/27/2018  . Acute diastolic CHF (congestive heart failure) ([place]) 03/18/2018  . Lower extremity edema 03/18/2018  . Food in esophagus causing other injury, initial encounter   . Reflux esophagitis   . Gastric nodule   . Symptomatic anemia 02/28/2018  . S/P hip hemiarthroplasty 02/26/2018  . Hip fracture ([place]) 02/22/2018  . Mixed hyperlipidemia 10/26/2017  . Coronary artery disease of native artery of native heart with stable angina pectoris ([place]) 10/24/2017  . Vulvar irritation 09/02/2017  . 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 Screening for cardiovascular condition 12/15/2016  . SOB (shortness of breath) 12/15/2016  . Idiopathic interstitial fibrosis of lung syndrome ([place]) 12/15/2016  . Scleroderma ([place]) 12/15/2016  .  Raynaud's disease without gangrene 12/15/2016  . Melanoma of skin ([place]) 11/11/2016  . Lesion of labia 11/11/2016    Past Medical History: Past Medical History:  Diagnosis Date  . Anemia    h/o  . Cancer ([place])    melanoma x 2 the last one was of her leg and surgeon removed it  . Colon polyp   . Coronary artery calcification   . Coronary  artery disease   . DDD (degenerative disc disease), lumbar   . Diverticulosis 01/20/2017  . Dysphagia   . Dyspnea    with exertion-unable to walk a mile without getting sob- dr sparks set pt up to see Dr Rockey Situ after 11-19-16 surgery  . Environmental allergies   . Esophageal dysmotility   . Fibrocystic breast disease   . GERD (gastroesophageal reflux disease)   . Heart murmur    h/o   . Hyperlipidemia    unspecified  . Hypertension   . Mitral regurgitation   . Monilial esophagitis ([place]) 01/20/2017  . Obstipation   . Osteopenia   . Peptic ulcer disease   . Pulmonary fibrosis ([place])    per Dr Raul Del  . RA (rheumatoid arthritis) ([place])   . Raynaud's disease   . Redundant colon 01/20/2017  . Scleroderma ([place])   . Telangiectasia of colon   . Tubular adenoma of colon 01/20/2017   unspecified  . Vulvar dysplasia     Past Surgical History: Past Surgical History:  Procedure Laterality Date  . ABDOMINAL HYSTERECTOMY    . BREAST CYST EXCISION Left 20+ years ago   No scar visible  . BREAST LUMPECTOMY Right   . CARPAL TUNNEL RELEASE Bilateral   . COLON SURGERY     colon polyp   . COLONOSCOPY  09/30/2011   tubular adenoma rtm  . COLONOSCOPY     05/02/2003, 04/10/2000  . COLONOSCOPY WITH PROPOFOL N/A 01/20/2017   Procedure: COLONOSCOPY WITH PROPOFOL;  Surgeon: Lollie Sails, MD;  Location: [facility] ENDOSCOPY;  Service: Endoscopy;  Laterality: N/A;  . ESOPHAGOGASTRODUODENOSCOPY    . ESOPHAGOGASTRODUODENOSCOPY     09/30/2011, 04/10/2000 , no repeat rtm  . ESOPHAGOGASTRODUODENOSCOPY N/A 03/02/2018   Procedure: ESOPHAGOGASTRODUODENOSCOPY (EGD);  Surgeon: Virgel Manifold, MD;  Location: [facility] ENDOSCOPY;  Service: Endoscopy;  Laterality: N/A;  . ESOPHAGOGASTRODUODENOSCOPY (EGD) WITH PROPOFOL N/A 01/20/2017   Procedure: ESOPHAGOGASTRODUODENOSCOPY (EGD) WITH PROPOFOL;  Surgeon: Lollie Sails, MD;  Location: [facility] ENDOSCOPY;  Service: Endoscopy;  Laterality: N/A;  . EXCISION HYDRADENITIS  LABIA Left 11/19/2016   Procedure: EXCISION LABIAL MASS;  Surgeon: Robert Bellow, MD;  Location: [facility] ORS;  Service: General;  Laterality: Left;  . EYE SURGERY Bilateral    cataracts  . HEEL SPUR EXCISION    . HIP ARTHROPLASTY Left 02/23/2018   Procedure: ARTHROPLASTY BIPOLAR HIP (HEMIARTHROPLASTY);  Surgeon: Thornton Park, MD;  Location: [facility] ORS;  Service: Orthopedics;  Laterality: Left;  Marland Kitchen MASS EXCISION Left 11/19/2016   Procedure: EXCISION LEFT THIGH MELANOMA;  Surgeon: Robert Bellow, MD;  Location: [facility] ORS;  Service: General;  Laterality: Left;  . SENTINEL NODE BIOPSY Left 11/19/2016   Procedure: INGUINAL SENTINEL NODE BIOPSY;  Surgeon: Robert Bellow, MD;  Location: [facility] ORS;  Service: General;  Laterality: Left;  . UPPER ESOPHAGEAL ENDOSCOPIC ULTRASOUND (EUS) N/A 01/29/2017   Procedure: UPPER ESOPHAGEAL ENDOSCOPIC ULTRASOUND (EUS);  Surgeon: Holly Bodily, MD;  Location[facility] ENDOSCOPY;  Service: Endoscopy;  Laterality: N/A;    Past Gynecologic History:  As per HPI  OB History:  OB History  Gravida Para Term Preterm AB Living  3 3          SAB TAB Ectopic Multiple Live Births               # Outcome Date GA Lbr Len/2nd Weight Sex Delivery Anes PTL Lv  3 Para           2 Para           1 Para             Family History: Family History  Problem Relation Age of Onset  . Endometrial cancer Mother 18  . Hypertension Mother   . Osteoporosis Mother   . Bladder Cancer Father   . COPD Father   . Cerebral palsy Sister     Social History: Social History   Socioeconomic History  . Marital status: Widowed    Spouse name: Not on file  . Number of children: Not on file  . Years of education: Not on file  . Highest education level: Not on file  Occupational History  . Occupation: retired  Scientific laboratory technician  . Financial resource strain: Not on file  . Food insecurity:    Worry: Not on file    Inability: Not on file  . Transportation needs:    Medical:  Not on file    Non-medical: Not on file  Tobacco Use  . Smoking status: Never Smoker  . Smokeless tobacco: Never Used  Substance and Sexual Activity  . Alcohol use: No  . Drug use: No  . Sexual activity: Not on file  Lifestyle  . Physical activity:    Days per week: Not on file    Minutes per session: Not on file  . Stress: Not on file  Relationships  . Social connections:    Talks on phone: Not on file    Gets together: Not on file    Attends religious service: Not on file    Active member of club or organization: Not on file    Attends meetings of clubs or organizations: Not on file    Relationship status: Not on file  . Intimate partner violence:    Fear of current or ex partner: Not on file    Emotionally abused: Not on file    Physically abused: Not on file    Forced sexual activity: Not on file  Other Topics Concern  . Not on file  Social History Narrative  . Not on file    Allergies: Allergies  Allergen Reactions  . Remicade [Infliximab] Shortness Of Breath and Itching  . Sulfa Antibiotics Other (See Comments)    Other Reaction: tounge cracked  . Actonel [Risedronate Sodium] Other (See Comments)  . Fosamax [Alendronate Sodium]     GI Bleed   . Procardia [Nifedipine] Hives  . Sulfur Swelling  . Wellbutrin [Bupropion] Anxiety    Current Medications: Current Outpatient Medications  Medication Sig Dispense Refill  . ALPRAZolam (XANAX) 0.5 MG tablet Take 1 tablet (0.5 mg total) by mouth 4 (four) times daily. (Patient taking differently: Take 0.5 mg by mouth 3 (three) times daily as needed. ) 120 tablet 0  . amLODipine (NORVASC) 10 MG tablet Take 5 mg by mouth daily. 1/2 tab    . aspirin 325 MG EC tablet Take 325 mg by mouth daily.     . cetirizine (ZYRTEC) 10 MG tablet Take 10 mg by mouth daily as needed for allergies.     Marland Kitchen dexlansoprazole (  DEXILANT) 60 MG capsule Take 1 capsule (60 mg total) by mouth daily. 30 capsule 0  . DULoxetine (CYMBALTA) 30 MG  capsule 1 capsule daily for 2 weeks, then increase to 2 capsules daily    . ezetimibe (ZETIA) 10 MG tablet TAKE ONE TABLET BY MOUTH EVERY DAY 30 tablet 11  . furosemide (LASIX) 20 MG tablet 20 mg daily.     Marland Kitchen gabapentin (NEURONTIN) 300 MG capsule Take 300 mg by mouth 3 (three) times daily.    . hydroxychloroquine (PLAQUENIL) 200 MG tablet Take 200 mg by mouth 2 (two) times daily.     Marland Kitchen latanoprost (XALATAN) 0.005 % ophthalmic solution Place 1 drop into both eyes at bedtime.     . Omega-3 Fatty Acids (FISH OIL) 1000 MG CAPS Take 1 capsule by mouth 2 (two) times daily.     . polyethylene glycol (MIRALAX / GLYCOLAX) packet Take 17 g by mouth daily.     . predniSONE (DELTASONE) 5 MG tablet Take 5 mg by mouth 2 (two) times daily with a meal.     . simvastatin (ZOCOR) 20 MG tablet Take 20 mg by mouth at bedtime.    Marland Kitchen zolpidem (AMBIEN) 5 MG tablet Take 1 tablet (5 mg total) by mouth at bedtime as needed. 14 tablet 0  . ferrous sulfate 325 (65 FE) MG tablet Take 1 tablet (325 mg total) by mouth 3 (three) times daily after meals. 60 tablet 3  . furosemide (LASIX) 40 MG tablet Take 40 mg by mouth daily.  90 tablet 3   No current facility-administered medications for this visit.     Review of Systems General: fatigue; weakness  HEENT: no complaints  Lungs: SOB  Cardiac: no complaints  GI: no complaints  GU: bladder function, vulvar irritation  Musculoskeletal: back pain  Extremities: no complaints  Skin: no complaints  Neuro: numbness/tingling  Endocrine: no complaints  Psych: no complaints       Objective:  Physical Examination:  BP 121/76   Pulse 96   Temp 97.7 F (36.5 C) (Oral)   Resp 20   Ht 5\' 4"  (1.626 m)   Wt 198 lb (89.8 kg)   BMI 33.99 kg/m    ECOG Performance Status: 1 - Symptomatic but completely ambulatory  General appearance: elderly appearing female using a walker; cooperative  HEENT:PERRLA, extra ocular movement intact and sclera clear, anicteric Abdomen: soft,  non-tender, without masses or organomegaly, no hernias and well healed incision Neurological exam reveals alert, oriented, normal speech  Pelvic: exam chaperoned by nurse;  Vulva: normal appearing vulva with no masses, tenderness or lesions, well healed 3 cm oblique incision of the left vulva; The area where she says is having irritation is right along the scar line. There are no abnormal raised lesions. The skin is white and mot likely this is due to scar tissue and not dysplastic process. Vagina: normal vagina; Adnexa: no masses; Uterus: surgically absent, vaginal cuff well healed; Cervix: surgically absent; Rectal: not indicated     Assessment:  Erin [name] is a 72 y.o. female diagnosed with vulvar dysplasia (VIN3) s/p excisional biopsy with close, but negative, margins. Exam negative but given complaints of persistent irritation/itching and close margins vulvar biopsy was performed. Biopsy was negative for VIN, inflammation noted and probably etiology of vulvar symptoms. Colposcopy 09/02/2018 reassuring. Exam 08/2018 reassuring  Medical co-morbidities complicating care: HTN, [name], GERD, h/o melanoma.  Plan:   Problem List Items Addressed This Visit      Other  Vulvar irritation - Primary      Return to clinic in  12 months based on patient preference.   The patient's diagnosis, an outline of the further diagnostic and laboratory studies which will be required, the recommendation, and alternatives were discussed.  All questions were answered to the patient's satisfaction.  A total of 15 minutes were spent with the patient/family today; 50% was spent in education, counseling and coordination of care for VIN3.    Gillis Ends, MD    CC:  Referring Providers: Barnett Applebaum, MD Hervey Ard, MD   PCP: Idelle Crouch, MD [facility] [place], [place] [address] [phone]

## 2018-09-01 ENCOUNTER — Ambulatory Visit: Payer: Medicare Other

## 2018-09-01 ENCOUNTER — Inpatient Hospital Stay: Payer: Medicare Other | Attending: Obstetrics and Gynecology | Admitting: Obstetrics and Gynecology

## 2018-09-01 VITALS — BP 121/76 | HR 96 | Temp 97.7°F | Resp 20 | Ht 64.0 in | Wt 198.0 lb

## 2018-09-01 DIAGNOSIS — D071 Carcinoma in situ of vulva: Secondary | ICD-10-CM | POA: Insufficient documentation

## 2018-09-01 DIAGNOSIS — N9089 Other specified noninflammatory disorders of vulva and perineum: Secondary | ICD-10-CM

## 2018-09-01 DIAGNOSIS — I2510 Atherosclerotic heart disease of native coronary artery without angina pectoris: Secondary | ICD-10-CM | POA: Insufficient documentation

## 2018-09-01 DIAGNOSIS — Z90710 Acquired absence of both cervix and uterus: Secondary | ICD-10-CM | POA: Insufficient documentation

## 2018-09-01 NOTE — Progress Notes (Signed)
Vulvar itching at times

## 2018-09-02 ENCOUNTER — Telehealth: Payer: Self-pay

## 2018-09-02 DIAGNOSIS — J84112 Idiopathic pulmonary fibrosis: Secondary | ICD-10-CM

## 2018-09-02 DIAGNOSIS — M349 Systemic sclerosis, unspecified: Secondary | ICD-10-CM

## 2018-09-02 NOTE — Telephone Encounter (Signed)
Pt is returning your call

## 2018-09-02 NOTE — Telephone Encounter (Signed)
LM on VM for patient to call and schedule apt. Also that she needs PFT and CXR before this apt, per Dr. Alva Garnet.

## 2018-09-02 NOTE — Telephone Encounter (Signed)
Spoke to patient, scheduled f/u apt. Patient stated she fell and broke her hip and had to cancel most of her MD apts. Patient aware we will place order for PFT and CXR to be completed before next visit.

## 2018-09-08 ENCOUNTER — Ambulatory Visit: Payer: Medicare Other

## 2018-09-16 ENCOUNTER — Ambulatory Visit
Admission: RE | Admit: 2018-09-16 | Discharge: 2018-09-16 | Disposition: A | Discharge status: Home / Self Care | Payer: Medicare Other | Source: Ambulatory Visit | Attending: Internal Medicine | Admitting: Internal Medicine

## 2018-09-16 DIAGNOSIS — Z1231 Encounter for screening mammogram for malignant neoplasm of breast: Secondary | ICD-10-CM | POA: Insufficient documentation

## 2018-10-05 ENCOUNTER — Ambulatory Visit
Admission: RE | Admit: 2018-10-05 | Discharge: 2018-10-05 | Disposition: A | Discharge status: Home / Self Care | Payer: Medicare Other | Source: Ambulatory Visit | Attending: Pulmonary Disease | Admitting: Pulmonary Disease

## 2018-10-05 ENCOUNTER — Ambulatory Visit (HOSPITAL_COMMUNITY): Payer: Medicare Other

## 2018-10-05 DIAGNOSIS — J84112 Idiopathic pulmonary fibrosis: Secondary | ICD-10-CM

## 2018-10-05 DIAGNOSIS — R918 Other nonspecific abnormal finding of lung field: Secondary | ICD-10-CM | POA: Insufficient documentation

## 2018-10-13 ENCOUNTER — Encounter: Payer: Self-pay | Admitting: Pulmonary Disease

## 2018-10-13 ENCOUNTER — Ambulatory Visit (INDEPENDENT_AMBULATORY_CARE_PROVIDER_SITE_OTHER): Payer: Medicare Other | Admitting: Pulmonary Disease

## 2018-10-13 VITALS — BP 130/70 | HR 82 | Ht 64.0 in | Wt 194.8 lb

## 2018-10-13 DIAGNOSIS — R0600 Dyspnea, unspecified: Secondary | ICD-10-CM | POA: Diagnosis not present

## 2018-10-13 DIAGNOSIS — J8410 Pulmonary fibrosis, unspecified: Secondary | ICD-10-CM | POA: Diagnosis not present

## 2018-10-13 DIAGNOSIS — R942 Abnormal results of pulmonary function studies: Secondary | ICD-10-CM | POA: Diagnosis not present

## 2018-10-13 DIAGNOSIS — M340 Progressive systemic sclerosis: Secondary | ICD-10-CM

## 2018-10-13 DIAGNOSIS — I25118 Atherosclerotic heart disease of native coronary artery with other forms of angina pectoris: Secondary | ICD-10-CM | POA: Diagnosis not present

## 2018-10-13 DIAGNOSIS — J849 Interstitial pulmonary disease, unspecified: Secondary | ICD-10-CM

## 2018-10-13 NOTE — Patient Instructions (Signed)
High resolution CT scan of chest ordered to evaluate for possible progression of pulmonary fibrosis  Echocardiogram ordered to evaluate for possible pulmonary hypertension  We will contact you with results of these tests if there are any urgent abnormalities noted  Follow-up in 4-6 weeks to discuss results of the above tests and further therapy  I am concerned regarding your ability to care for all of your needs at home.  If you wish, at any time, we can contact social services to see what services in the home might be available to you

## 2018-10-13 NOTE — Progress Notes (Signed)
PULMONARY OFFICE FOLLOW UP  NOTE  Requesting MD/Service: [place]. Primary MD: Doy Hutching. RheumJefm Bryant Date of initial consultation: 02/23/17 Reason for consultation: Scleroderma, pulmonary fibrosis, progressive DOE  PT PROFILE: 72 y.o. 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 Previously followed by Dr Raul Del  DATA: PFTs 2015: No obstruction, TLC 90%, DLCO 72% CT chest 10/01/15: Dilated esophagus consistent with the given history of scleroderma. This is also stable from the most recent prior CT. Lungs show changes of interstitial fibrosis, likely usual interstitial fibrosis, predominantly in the lower lungs, similar to the most recent prior CT CXR 11/18/16: Chronic interstitial changes are present at the lung bases 6MWT 03/04/17: 168 m, no desaturation. Patient stated that she felt like she couldn't get any air and started wheezing. Echocardiogram 03/31/17: Mild concentric LV hypertrophy with LVEF 55-60%. No evidence of pulmonary hypertension. PFTs 06/02/17 FVC: 2.46 L (97 %pred), FEV1: 1.83 L ( 91 %pred), FEV1/FVC: 74% , TLC:  4.06 L ( 97 %pred), DLCO 54%pred CXR 06/02/17: Slight progression in pulmonary fibrosis particularly in the upper lobes when compared with the prior exam PFT 10/05/18: FVC: 1.97 L (76 %pred), FEV1: 1.63 L (81 %pred), FEV1/FVC: 83%, TLC: 3.74 L (78 %pred), DLCO 63 %pred   INTERVAL: Was scheduled for back surgery in March.  However suffered fall and left hip fracture requiring ORIF and prolonged rehabilitation (SNF x1 month).  Subsequently suffered another fall several weeks ago with blunt trauma to right hemithorax.  SUBJ:  This is a scheduled follow-up.  She is here to review results of PFTs and repeat CXR.  She is less active and ambulatory now after her recent falls and injuries.  She is now ambulating with a walker.  She is more limited by her physical incapacity than by  respiratory limitation.  Since last seen, she has had problems with increasing lower extremity edema.  She has been treated by vascular surgery with some vascular ablation procedure.  She has also on diuretic therapy with improvement in this.  She denies significant cough and sputum production.  She has no fevers, no hemoptysis, no pleuritic chest pain.   Vitals:   10/13/18 1441 10/13/18 1443  BP:  130/70  Pulse:  82  SpO2:  98%  Weight: 194 lb 12.8 oz (88.4 kg)   Height: 5\' 4"  (1.626 m)   RA  EXAM:  Gen: Somewhat frail-appearing, no overt distress HEENT: No acute findings Neck: JVP not visualized Lungs: L>R bibasilar crackles Cardiovascular: Reg, no murmurs Abdomen: Soft, nontender, normal BS Ext: sclerodactyly, telangiectasias, no edema Neuro: No focal deficits Skin: innumerable telangiectasias on face and torso  DATA:   BMP Latest Ref Rng & Units 03/18/2018 03/17/2018 03/02/2018  Glucose 65 - 99 mg/dL 78 140(H) 101(H)  BUN 6 - 20 mg/dL 16 15 38(H)  Creatinine 0.44 - 1.00 mg/dL 0.63 0.93 1.11(H)  Sodium 135 - 145 mmol/L 137 136 133(L)  Potassium 3.5 - 5.1 mmol/L 4.9 5.9(H) 4.4  Chloride 101 - 111 mmol/L 100(L) 99(L) 98(L)  CO2 22 - 32 mmol/L 31 26 25   Calcium 8.9 - 10.3 mg/dL 8.1(L) 8.7(L) 8.6(L)    CBC Latest Ref Rng & Units 03/18/2018 03/17/2018 03/02/2018  WBC 3.6 - 11.0 K/uL 16.3(H) 20.0(H) 19.6(H)  Hemoglobin 12.0 - 16.0 g/dL 10.4(L) 11.7(L) 10.8(L)  Hematocrit 35.0 - 47.0 % 32.5(L) 36.8 33.4(L)  Platelets 150 - 440 K/uL 353 492(H) 402    CXR 10/29:  Chronic  lung changes with emphysema and pulmonary scarring most notable at the lung bases. Nodular opacity in the right lower lobe not well demonstrated on the lateral film. This could be a focus of atelectasis or infiltrate  IMPRESSION:     ICD-10-CM   1. 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 Pulmonary fibrosis ([place]) J84.10   3. Progressive dyspnea R06.00   4. Restrictive pattern  present on pulmonary function testing R94.2   5. Interstitial pulmonary disease (HCC) J84.9 CT Chest High Resolution   There is a 10-20% reduction in most of her PFT measurements.  I am concerned about progressive pulmonary fibrosis.  Also, with the worsening lower extremity edema, I am concerned about the possibility of the development of pulmonary hypertension   PLAN:  HRCT chest ordered to evaluate for pulmonary fibrosis Echocardiogram ordered to evaluate for pulmonary hypertension We will contact her if there are any critical results that need to be addressed urgently She will follow-up in 4 to 6 weeks to discuss the results of the above and discuss further therapies  Of note, she is quite frail and has minimal assistance in her home.  I offered that we could contact social services to see if there are any community services that might be provided to her to alleviate some of the burden that is imposed by her multiple medical problems  Merton Border, MD PCCM service Mobile [phone] Pager [phone] 10/13/2018 3:19 PM

## 2018-10-20 ENCOUNTER — Other Ambulatory Visit: Payer: Self-pay | Admitting: Pulmonary Disease

## 2018-10-20 DIAGNOSIS — M340 Progressive systemic sclerosis: Secondary | ICD-10-CM

## 2018-10-20 DIAGNOSIS — R0600 Dyspnea, unspecified: Secondary | ICD-10-CM

## 2018-10-26 ENCOUNTER — Other Ambulatory Visit: Payer: Self-pay

## 2018-10-26 ENCOUNTER — Ambulatory Visit (INDEPENDENT_AMBULATORY_CARE_PROVIDER_SITE_OTHER): Payer: Medicare Other

## 2018-10-26 DIAGNOSIS — R0600 Dyspnea, unspecified: Secondary | ICD-10-CM

## 2018-10-26 DIAGNOSIS — M340 Progressive systemic sclerosis: Secondary | ICD-10-CM

## 2018-11-02 ENCOUNTER — Ambulatory Visit
Admission: RE | Admit: 2018-11-02 | Discharge: 2018-11-02 | Disposition: A | Discharge status: Home / Self Care | Payer: Medicare Other | Source: Ambulatory Visit | Attending: Pulmonary Disease | Admitting: Pulmonary Disease

## 2018-11-02 ENCOUNTER — Ambulatory Visit: Admission: RE | Admit: 2018-11-02 | Payer: Medicare Other | Source: Ambulatory Visit

## 2018-11-02 DIAGNOSIS — J849 Interstitial pulmonary disease, unspecified: Secondary | ICD-10-CM | POA: Diagnosis not present

## 2018-11-24 ENCOUNTER — Encounter: Payer: Self-pay | Admitting: Pulmonary Disease

## 2018-11-24 ENCOUNTER — Ambulatory Visit (INDEPENDENT_AMBULATORY_CARE_PROVIDER_SITE_OTHER): Payer: Medicare Other | Admitting: Pulmonary Disease

## 2018-11-24 ENCOUNTER — Other Ambulatory Visit
Admission: RE | Admit: 2018-11-24 | Discharge: 2018-11-24 | Disposition: A | Discharge status: Home / Self Care | Payer: Medicare Other | Source: Ambulatory Visit | Attending: Pulmonary Disease | Admitting: Pulmonary Disease

## 2018-11-24 VITALS — BP 170/90 | HR 87 | Resp 16 | Ht 64.0 in | Wt 194.0 lb

## 2018-11-24 DIAGNOSIS — M340 Progressive systemic sclerosis: Secondary | ICD-10-CM | POA: Diagnosis not present

## 2018-11-24 DIAGNOSIS — M349 Systemic sclerosis, unspecified: Secondary | ICD-10-CM

## 2018-11-24 DIAGNOSIS — I25118 Atherosclerotic heart disease of native coronary artery with other forms of angina pectoris: Secondary | ICD-10-CM | POA: Diagnosis not present

## 2018-11-24 DIAGNOSIS — J8410 Pulmonary fibrosis, unspecified: Secondary | ICD-10-CM | POA: Diagnosis not present

## 2018-11-24 DIAGNOSIS — R0609 Other forms of dyspnea: Secondary | ICD-10-CM | POA: Diagnosis not present

## 2018-11-24 DIAGNOSIS — M3481 Systemic sclerosis with lung involvement: Secondary | ICD-10-CM | POA: Diagnosis present

## 2018-11-24 LAB — HEPATIC FUNCTION PANEL
ALT: 24 U/L (ref 0–44)
AST: 34 U/L (ref 15–41)
Albumin: 3.7 g/dL (ref 3.5–5.0)
Alkaline Phosphatase: 84 U/L (ref 38–126)
BILIRUBIN TOTAL: 0.3 mg/dL (ref 0.3–1.2)
Bilirubin, Direct: 0.1 mg/dL (ref 0.0–0.2)
Indirect Bilirubin: 0.2 mg/dL — ABNORMAL LOW (ref 0.3–0.9)
Total Protein: 6.6 g/dL (ref 6.5–8.1)

## 2018-11-24 NOTE — Progress Notes (Signed)
PULMONARY OFFICE FOLLOW UP  NOTE  Requesting MD/Service: Gollan. Primary MD: Doy Hutching. RheumJefm Bryant Date of initial consultation: 02/23/17 Reason for consultation: Scleroderma, pulmonary fibrosis, progressive DOE  PT PROFILE: 72 y.o. 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 Referred for evaluation of progressive DOE. Previously followed by Dr Raul Del  DATA: PFTs 2015: No obstruction, TLC 90%, DLCO 72% CT chest 10/01/15: Dilated esophagus consistent with the given history of scleroderma. This is also stable from the most recent prior CT. Lungs show changes of interstitial fibrosis, likely usual interstitial fibrosis, predominantly in the lower lungs, similar to the most recent prior CT CXR 11/18/16: Chronic interstitial changes are present at the lung bases 6MWT 03/04/17: 168 m, no desaturation. Patient stated that she felt like she couldn't get any air and started wheezing. Echocardiogram 03/31/17: Mild concentric LV hypertrophy with LVEF 55-60%. No evidence of pulmonary hypertension. 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  Mild LVH.  Left atrium mildly dilated.  Right ventricle cavity size normal.  RV systolic function normal.  RVSP estimated 35-40 mmHg.     INTERVAL: Last seen 11/06. No major pulmonary events. Here to review results of HRCT chest and echocardiogram  SUBJ:  This is a  scheduled follow-up.  She has no new complaints.  She remains profoundly dyspneic with minimal exertion (class IV dyspnea).  There is little day-to-day or moment to moment variation.  She has no cough or sputum production.  She denies chest pain.  Her lower extremity edema is much improved since initiating diuretic therapy.   Vitals:   11/24/18 1417 11/24/18 1423  BP:  (!) 170/90  Pulse:  87  Resp: 16   SpO2:  96%  Weight: 194 lb (88 kg)   Height: 5\' 4"  (1.626 m)   RA  EXAM:  General: Frail-appearing, no overt distress HEENT: Multiple telangiectasias on face, no acute findings Neck: No JVD, no LN noted Lungs: Faint bibasilar crackles Cardiovascular: Regular, no M Abdomen: Moderately obese, soft, nontender, NABS Extremities: Severe sclerodactyly, telangiectasias, no LE edema Neuro: Diffusely weak, no focal deficits Skin: Innumerable telangiectasias on face and upper chest  DATA:   BMP Latest Ref Rng & Units 03/18/2018 03/17/2018 03/02/2018  Glucose 65 - 99 mg/dL 78 140(H) 101(H)  BUN 6 - 20 mg/dL 16 15 38(H)  Creatinine 0.44 - 1.00 mg/dL 0.63 0.93 1.11(H)  Sodium 135 - 145 mmol/L 137 136 133(L)  Potassium 3.5 - 5.1 mmol/L 4.9 5.9(H) 4.4  Chloride 101 - 111 mmol/L 100(L) 99(L) 98(L)  CO2 22 - 32 mmol/L 31 26 25   Calcium 8.9 - 10.3 mg/dL 8.1(L) 8.7(L) 8.6(L)    CBC Latest Ref Rng & Units 03/18/2018 03/17/2018 03/02/2018  WBC 3.6 - 11.0 K/uL 16.3(H) 20.0(H) 19.6(H)  Hemoglobin 12.0 - 16.0 g/dL 10.4(L) 11.7(L) 10.8(L)  Hematocrit 35.0 - 47.0 % 32.5(L) 36.8 33.4(L)  Platelets 150 - 440 K/uL 353 492(H) 402    CXR: No new film  All chest radiographic images have been reviewed by me  IMPRESSION:     ICD-10-CM   1. 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 Pulmonary fibrosis (HCC) J84.10   3. 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 Progressive severe exertional dyspnea R06.09    Symptomatically, she has had progressive dyspnea over months to years.  Her PFTs  demonstrate progression in the past year.  HRCT demonstrates progression of pulmonary fibrosis over the past 3 years.  We had a detailed discussion about scleroderma related pulmonary fibrosis.  I made it clear that this is not the only cause of her dyspnea and exercise intolerance.  She understands that any therapy directed at pulmonary fibrosis is initiated to prevent further progression but will not reverse the fibrosis that already exists.  We discussed the only approved therapeutic option, nintedanib (Ofev), including the potential challenges of getting it approved for her, the potential cost to her and the potential adverse effects which include diarrhea (common), nausea/vomiting (less common), elevated liver function tests.  She wishes to proceed with a trial of therapy but we both are prepared to discontinue therapy if it is unaffordable or intolerable   PLAN:  We will begin the process of approval for nintedanib In preparation, LFTs ordered Follow-up in 3 months.  Call sooner if needed   Merton Border, MD PCCM service Mobile [phone] Pager [phone] 11/24/2018 2:50 PM

## 2018-11-24 NOTE — Patient Instructions (Addendum)
We will begin the process to get approval for Ofev  In preparation for this, we need to get liver function test measured.  I have placed order for this Follow up in 3 months. Call sooner as needed

## 2018-11-25 ENCOUNTER — Encounter: Payer: Self-pay | Admitting: Pulmonary Disease

## 2018-11-26 ENCOUNTER — Other Ambulatory Visit: Payer: Self-pay

## 2018-11-26 ENCOUNTER — Other Ambulatory Visit: Payer: Self-pay | Admitting: Pulmonary Disease

## 2018-11-26 MED ORDER — NINTEDANIB ESYLATE 100 MG PO CAPS: 100.0000 mg | ORAL_CAPSULE | Freq: Two times a day (BID) | ORAL | 11 refills | Status: DC

## 2018-11-26 NOTE — Telephone Encounter (Signed)
Noted  

## 2018-11-29 ENCOUNTER — Telehealth: Payer: Self-pay | Admitting: Pulmonary Disease

## 2018-11-29 NOTE — Telephone Encounter (Signed)
Called and made aware we sent to [facility] mail order. Also sent paperwork for patient assistance to see if she qualifies for any assistance. Nothing further needed at this time.

## 2018-12-07 NOTE — Telephone Encounter (Signed)
Pt stated that she got approval from her insurance for the rx and now wants to get an update on the financial assistance process and if she got approved for that yet?

## 2018-12-07 NOTE — Telephone Encounter (Signed)
Returned call to patient to find out if she has heard from mail order [facility]. Patient states she had not heard from [facility] yet. Called [facility] Spoke with Blanch Media who states PA required and a form was sent to providers office. I have looked and have not been able to find a form. I requested a PA be sent. Nothing further needed until received.

## 2018-12-20 ENCOUNTER — Telehealth: Payer: Self-pay | Admitting: Pulmonary Disease

## 2018-12-20 NOTE — Telephone Encounter (Signed)
Spoke to patient and helped her fill out missing information on [employer] paperwork for [employer] financial support. She has gotten one month supply so far and is completing paper work for assistance. Will let us know if she needs further help.

## 2018-12-21 ENCOUNTER — Telehealth: Payer: Self-pay

## 2018-12-21 NOTE — Telephone Encounter (Signed)
Called and spoke to [facility] rep. Patient assistance is being held up due to missing fax #. Wrote our fax # on paperwork and resubmitted.

## 2018-12-31 ENCOUNTER — Other Ambulatory Visit: Payer: Self-pay | Admitting: Cardiovascular Disease

## 2019-02-07 ENCOUNTER — Ambulatory Visit (INDEPENDENT_AMBULATORY_CARE_PROVIDER_SITE_OTHER): Payer: Medicare Other | Admitting: Pulmonary Disease

## 2019-02-07 ENCOUNTER — Encounter: Payer: Self-pay | Admitting: Pulmonary Disease

## 2019-02-07 VITALS — BP 142/80 | HR 86 | Ht 64.0 in | Wt 192.0 lb

## 2019-02-07 DIAGNOSIS — I25118 Atherosclerotic heart disease of native coronary artery with other forms of angina pectoris: Secondary | ICD-10-CM | POA: Diagnosis not present

## 2019-02-07 DIAGNOSIS — M340 Progressive systemic sclerosis: Secondary | ICD-10-CM

## 2019-02-07 DIAGNOSIS — J8410 Pulmonary fibrosis, unspecified: Secondary | ICD-10-CM

## 2019-02-07 NOTE — Progress Notes (Signed)
Cardiology Office Note  Date:  02/09/2019   ID:  Erin [name], DOB 17-Nov-1946, MRN [id number]  PCP:  Idelle Crouch, MD   Chief Complaint  Patient presents with  . Other    OD 6 month follow up. Patietn c/o SOB. Patient was last seen 03/2018. Meds reviewed verbally with patient.     HPI:  Erin [name] is a very pleasant 73 year old woman with history of  [name], Seen on CT scan chest October 2016 mid to distal LAD region Scleroderma,  raynauds disease (toes, hands), arthritis,  chronic interstitial lung disease at the bases seen on CT scan chest x-ray,  who presents for worsening shortness of breath on exertion, [name]  She reports having severe complications from her scleroderma Wounds on her hands over the knuckles are not healing, often will weep Takes amlodipine twice a day for raynauds Difficulty swallowing food, several episodes of severe choking Lady in a restaurant gave her the Heimlich maneuver, broke a rib Chronic shortness of breath Maintained on low-dose prednisone,  follows with [facility] for pulm issues She takes Lasix 20 mg daily  Gait instability No falls  Lab work reviewed with her in detail CBC normal CHem normal Total chol 185, LDL 75  Echo 10/2018 reviewed with her Left ventricle: mild LVH. Systolic function was normal.   The estimated ejection fraction was in the range of 60% to 65%.   Left ventricular diastolic function parameters were normal. - Ascending aorta: The ascending aorta was mildly dilated.  Systolic pressure was mildly increased, in   the range of 35 mm Hg to 40 mm Hg.  EKG personally reviewed by myself on todays visit Shows normal sinus rhythm with rate 90 bpm APCs, left axis deviation Other past medical history reviewed Several recent hospitalizations March 2019 Had fall with left femoral fracture Surgical repair, subsequent anemia Fall was mechanical, down some steps  Readmitted to the hospital several days later with worsening  anemia At EGD showing small region of erosion Started on iron,  Blood transfusions for hemoglobin of 6 She reports worsening lower extremity edema Albumin/protein low  Stress test 12/19/2016 No ischemia, EF >55%  Echo 03/2017 Normal ejection fraction  Spinal stenosis Disk problems Walking with a cane and walker Was told not a candidate for surgery,  Previous CXR showing chronic interstitial changes are present at the lung bases.  CT scan results and images  mid to distal LAD coronary calcifications No significant thoracic descending  or ascending aortic atherosclerosis It does show fibrosis at the bases of the lungs bilaterally perhaps worse on the left than the right   PMH:   has a past medical history of Anemia, Cancer ([place]), Colon polyp, Coronary artery calcification, Coronary artery disease, DDD (degenerative disc disease), lumbar, Diverticulosis (01/20/2017), Dysphagia, Dyspnea, Environmental allergies, Esophageal dysmotility, Fibrocystic breast disease, GERD (gastroesophageal reflux disease), Heart murmur, Hyperlipidemia, Hypertension, Mitral regurgitation, Monilial esophagitis ([place]) (01/20/2017), Obstipation, Osteopenia, Peptic ulcer disease, Pulmonary fibrosis ([place]), RA (rheumatoid arthritis) ([place]), Raynaud's disease, Redundant colon (01/20/2017), Scleroderma ([place]), Telangiectasia of colon, Tubular adenoma of colon (01/20/2017), and Vulvar dysplasia.  PSH:    Past Surgical History:  Procedure Laterality Date  . ABDOMINAL HYSTERECTOMY    . BREAST CYST EXCISION Left 20+ years ago   No scar visible  . BREAST LUMPECTOMY Right   . CARPAL TUNNEL RELEASE Bilateral   . COLON SURGERY     colon polyp   . COLONOSCOPY  09/30/2011   tubular adenoma rtm  . COLONOSCOPY  05/02/2003, 04/10/2000  . COLONOSCOPY WITH PROPOFOL N/A 01/20/2017   Procedure: COLONOSCOPY WITH PROPOFOL;  Surgeon: Lollie Sails, MD;  Location: [facility] ENDOSCOPY;  Service: Endoscopy;  Laterality: N/A;  .  ESOPHAGOGASTRODUODENOSCOPY    . ESOPHAGOGASTRODUODENOSCOPY     09/30/2011, 04/10/2000 , no repeat rtm  . ESOPHAGOGASTRODUODENOSCOPY N/A 03/02/2018   Procedure: ESOPHAGOGASTRODUODENOSCOPY (EGD);  Surgeon: Virgel Manifold, MD;  [facility] ENDOSCOPY;  Service: Endoscopy;  Laterality: N/A;  . ESOPHAGOGASTRODUODENOSCOPY (EGD) WITH PROPOFOL N/A 01/20/2017   Procedure: ESOPHAGOGASTRODUODENOSCOPY (EGD) WITH PROPOFOL;  Surgeon: Lollie Sails, MD;  Location: [facility] ENDOSCOPY;  Service: Endoscopy;  Laterality: N/A;  . EXCISION HYDRADENITIS LABIA Left 11/19/2016   Procedure: EXCISION LABIAL MASS;  Surgeon: Robert Bellow, MD;  Location: [facility] ORS;  Service: General;  Laterality: Left;  . EYE SURGERY Bilateral    cataracts  . HEEL SPUR EXCISION    . HIP ARTHROPLASTY Left 02/23/2018   Procedure: ARTHROPLASTY BIPOLAR HIP (HEMIARTHROPLASTY);  Surgeon: Thornton Park, MD;  Location: [facility] ORS;  Service: Orthopedics;  Laterality: Left;  Marland Kitchen MASS EXCISION Left 11/19/2016   Procedure: EXCISION LEFT THIGH MELANOMA;  Surgeon: Robert Bellow, MD;  Location: [facility] ORS;  Service: General;  Laterality: Left;  . SENTINEL NODE BIOPSY Left 11/19/2016   Procedure: INGUINAL SENTINEL NODE BIOPSY;  Surgeon: Robert Bellow, MD;  Location: [facility] ORS;  Service: General;  Laterality: Left;  . UPPER ESOPHAGEAL ENDOSCOPIC ULTRASOUND (EUS) N/A 01/29/2017   Procedure: UPPER ESOPHAGEAL ENDOSCOPIC ULTRASOUND (EUS);  Surgeon: Holly Bodily, MD;  Location: [facility] ENDOSCOPY;  Service: Endoscopy;  Laterality: N/A;    Current Outpatient Medications  Medication Sig Dispense Refill  . ALPRAZolam (XANAX) 0.5 MG tablet Take 0.5 mg by mouth 4 (four) times daily as needed for anxiety.    Marland Kitchen amLODipine (NORVASC) 10 MG tablet Take 10 mg by mouth daily.    Marland Kitchen aspirin 81 MG tablet Take 81 mg by mouth daily.     Marland Kitchen dexlansoprazole (DEXILANT) 60 MG capsule Take 1 capsule (60 mg total) by mouth daily. 30 capsule 0  . DULoxetine  (CYMBALTA) 30 MG capsule 1 capsule daily for 2 weeks, then increase to 2 capsules daily    . ezetimibe (ZETIA) 10 MG tablet TAKE ONE TABLET BY MOUTH EVERY DAY 30 tablet 1  . furosemide (LASIX) 20 MG tablet 20 mg daily.     Marland Kitchen gabapentin (NEURONTIN) 300 MG capsule Take 300 mg by mouth 3 (three) times daily.    . hydroxychloroquine (PLAQUENIL) 200 MG tablet Take 200 mg by mouth 2 (two) times daily.     Marland Kitchen latanoprost (XALATAN) 0.005 % ophthalmic solution Place 1 drop into both eyes at bedtime.     . Nintedanib (OFEV) 100 MG CAPS Take 1 capsule (100 mg total) by mouth 2 (two) times daily. 60 capsule 11  . Omega-3 Fatty Acids (FISH OIL) 1000 MG CAPS Take 1 capsule by mouth 2 (two) times daily.     . polyethylene glycol (MIRALAX / GLYCOLAX) packet Take 17 g by mouth daily.     . predniSONE (DELTASONE) 5 MG tablet Take 5 mg by mouth daily with breakfast.     . simvastatin (ZOCOR) 20 MG tablet Take 20 mg by mouth at bedtime.    Marland Kitchen zolpidem (AMBIEN) 5 MG tablet Take 1 tablet (5 mg total) by mouth at bedtime as needed. 14 tablet 0   No current facility-administered medications for this visit.      Allergies:   Remicade [infliximab]; Sulfa antibiotics;  Actonel [risedronate sodium]; Fosamax [alendronate sodium]; Procardia [nifedipine]; Sulfur; and Wellbutrin [bupropion]   Social History:  The patient  reports that she has never smoked. She has never used smokeless tobacco. She reports that she does not drink alcohol or use drugs.   Family History:   family history includes Bladder Cancer in her father; COPD in her father; Cerebral palsy in her sister; Endometrial cancer (age of onset: 33) in her mother; Hypertension in her mother; Osteoporosis in her mother.    Review of Systems: Review of Systems  Respiratory: Negative.   Cardiovascular: Positive for leg swelling.  Gastrointestinal: Negative.   Musculoskeletal: Positive for falls.       Leg weakness  Neurological: Positive for weakness.   Psychiatric/Behavioral: Negative.   All other systems reviewed and are negative.    PHYSICAL EXAM: VS:  BP 126/68 (BP Location: Left Arm, Patient Position: Sitting, Cuff Size: Normal)   Pulse 90   Ht 5\' 2"  (1.575 m)   Wt 190 lb (86.2 kg)   BMI 34.75 kg/m  , BMI Body mass index is 34.75 kg/m. Constitutional:  oriented to person, place, and time. No distress.  HENT:  Head: Grossly normal Eyes:  no discharge. No scleral icterus.  Neck: No JVD, no carotid bruits  Cardiovascular: Regular rate and rhythm, no murmurs appreciated Pulmonary/Chest: Clear to auscultation bilaterally, Rales at the bases halfway up bilaterally Abdominal: Soft.  no distension.  no tenderness.  Musculoskeletal: Normal range of motion Neurological:  normal muscle tone. Coordination normal. No atrophy Skin: Cracked skin on the hands bilaterally, sores on the knuckles Psychiatric: normal affect, pleasant   Recent Labs: 02/26/2018: B Natriuretic Peptide 84.0 03/18/2018: BUN 16; Creatinine, Ser 0.63; Hemoglobin 10.4; Platelets 353; Potassium 4.9; Sodium 137 11/24/2018: ALT 24    Lipid Panel No results found for: CHOL, HDL, LDLCALC, TRIG    Wt Readings from Last 3 Encounters:  02/09/19 190 lb (86.2 kg)  02/07/19 192 lb (87.1 kg)  11/24/18 194 lb (88 kg)      ASSESSMENT AND PLAN:  Shortness of breath  interstitial fibrosis, not on oxygen Previous normal stress test, echocardiogram with normal ejection fraction diastolic CHF , on Lasix daily Previously was on Lasix 40 daily now only taking 20 daily.  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) Followed by pulmonary,  Severe disease, sedentary lifestyle  Scleroderma ([place]) Followed by rheumatology On chronic prednisone  Raynaud's disease without gangrene Reports she is taking 2 prednisone a day Likely  contributing to some lower extremity edema Difficulty with wound healing  [name]  Seen on CT scan chest October 2016 mid to distal LAD region No anginal symptoms, no further testing at this time  Leg swelling On Lasix 20 daily, suggested she could increase up to 40 alternating with 20 every other day Symptoms also likely exacerbated by amlodipine She is requiring amlodipine for ray nodes   Total encounter time more than 25 minutes  Greater than 50% was spent in counseling and coordination of care with the patient   Disposition:   F/U  12 months   Orders Placed This Encounter  Procedures  . EKG 12-Lead     Signed, Esmond Plants, M.D., Ph.D. 02/09/2019  [place], [place]

## 2019-02-07 NOTE — Patient Instructions (Signed)
Continue Ofev No changes made to the rest of your medical regimen I will review the chest x-ray performed at [facility] clinic when it is available to me and contact you if there are any findings that are worrisome  Follow-up in 3 months with repeat PFTs prior to that visit

## 2019-02-09 ENCOUNTER — Ambulatory Visit (INDEPENDENT_AMBULATORY_CARE_PROVIDER_SITE_OTHER): Payer: Medicare Other | Admitting: Cardiovascular Disease

## 2019-02-09 ENCOUNTER — Encounter: Payer: Self-pay | Admitting: Cardiovascular Disease

## 2019-02-09 VITALS — BP 126/68 | HR 90 | Ht 62.0 in | Wt 190.0 lb

## 2019-02-09 DIAGNOSIS — J84112 Idiopathic pulmonary fibrosis: Secondary | ICD-10-CM | POA: Diagnosis not present

## 2019-02-09 DIAGNOSIS — R0602 Shortness of breath: Secondary | ICD-10-CM

## 2019-02-09 DIAGNOSIS — I5032 Chronic diastolic (congestive) heart failure: Secondary | ICD-10-CM

## 2019-02-09 DIAGNOSIS — E782 Mixed hyperlipidemia: Secondary | ICD-10-CM | POA: Diagnosis not present

## 2019-02-09 DIAGNOSIS — I25118 Atherosclerotic heart disease of native coronary artery with other forms of angina pectoris: Secondary | ICD-10-CM | POA: Diagnosis not present

## 2019-02-09 DIAGNOSIS — R600 Localized edema: Secondary | ICD-10-CM

## 2019-02-09 DIAGNOSIS — M349 Systemic sclerosis, unspecified: Secondary | ICD-10-CM

## 2019-02-09 MED ORDER — EZETIMIBE 10 MG PO TABS: 10.0000 mg | ORAL_TABLET | Freq: Every day | ORAL | 3 refills | Status: DC

## 2019-02-09 NOTE — Progress Notes (Signed)
PULMONARY OFFICE FOLLOW UP  NOTE  Requesting MD/Service: Gollan. Primary MD: Doy Hutching. RheumJefm Bryant Date of initial consultation: 02/23/17 Reason for consultation: Scleroderma, pulmonary fibrosis, progressive DOE  PT PROFILE: 73 y.o. 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 Referred for evaluation of progressive DOE. Previously followed by Dr Raul Del  DATA: PFTs 2015: No obstruction, TLC 90%, DLCO 72% CT chest 10/01/15: Dilated esophagus consistent with the given history of scleroderma. This is also stable from the most recent prior CT. Lungs show changes of interstitial fibrosis, likely usual interstitial fibrosis, predominantly in the lower lungs, similar to the most recent prior CT CXR 11/18/16: Chronic interstitial changes are present at the lung bases 6MWT 03/04/17: 168 m, no desaturation. Patient stated that she felt like she couldn't get any air and started wheezing. Echocardiogram 03/31/17: Mild concentric LV hypertrophy with LVEF 55-60%. No evidence of pulmonary hypertension. 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  Mild LVH.  Left atrium mildly dilated.  Right ventricle cavity size normal.  RV systolic function normal.  RVSP estimated 35-40 mmHg.     INTERVAL: Last seen 11/24/2018.  Overall has done well.  Suffered a "choking" episode while eating 2/23 which required performance of  the Heimlich maneuver.  She was seen at [facility] clinic after that event but did not require further evaluation or hospitalization.  SUBJ:  This is a scheduled follow-up.  She has no new complaints.  She is tolerating Ofev well.  Initially she had some nausea but has learned to manage this.  She has had no problems with diarrhea.  She notes no significant change in respiratory status.  She continues to have moderate exertional dyspnea but notably is relatively sedentary.  She denies CP, fever, purulent sputum, hemoptysis, LE edema and calf tenderness.   Vitals:   02/07/19 1426  BP: (!) 142/80  Pulse: 86  SpO2: 98%  Weight: 192 lb (87.1 kg)  Height: 5\' 4"  (1.626 m)  RA  EXAM:  General: No overt distress HEENT: Multiple telangiectasias, no acute findings Neck: No JVD noted Lungs: Faint bibasilar crackles Cardiovascular: Regular, no M Abdomen: NT, NABS Extremities: Sclerodactyly, periungual telangiectasias, no edema Neuro: No focal deficits Skin: No acute findings  DATA:   BMP Latest Ref Rng & Units 03/18/2018 03/17/2018 03/02/2018  Glucose 65 - 99 mg/dL 78 140(H) 101(H)  BUN 6 - 20 mg/dL 16 15 38(H)  Creatinine 0.44 - 1.00 mg/dL 0.63 0.93 1.11(H)  Sodium 135 - 145 mmol/L 137 136 133(L)  Potassium 3.5 - 5.1 mmol/L 4.9 5.9(H) 4.4  Chloride 101 - 111 mmol/L 100(L) 99(L) 98(L)  CO2 22 - 32 mmol/L 31 26 25   Calcium 8.9 - 10.3 mg/dL 8.1(L) 8.7(L) 8.6(L)    CBC Latest Ref Rng & Units 03/18/2018 03/17/2018 03/02/2018  WBC 3.6 - 11.0 K/uL 16.3(H) 20.0(H) 19.6(H)  Hemoglobin 12.0 - 16.0 g/dL 10.4(L) 11.7(L) 10.8(L)  Hematocrit 35.0 - 47.0 % 32.5(L) 36.8 33.4(L)  Platelets 150 -  440 K/uL 353 492(H) 402    CXR: No new film  All chest radiographic images have been reviewed by me  IMPRESSION:     ICD-10-CM   1. 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 Pulmonary fibrosis ([place]) J84.10 Pulmonary Function Test [facility] Only    PLAN:  Continue  Ofev No other changes were made in her medical regimen LFTs were checked in February and are normal Follow-up in 3 months with repeat PFTs prior to that visit   Merton Border, MD PCCM service Mobile [phone] Pager [phone] 02/09/2019 9:26 AM

## 2019-02-09 NOTE — Patient Instructions (Signed)

## 2019-03-21 ENCOUNTER — Telehealth: Payer: Self-pay | Admitting: Pulmonary Disease

## 2019-03-21 NOTE — Telephone Encounter (Signed)
Received letter from [employer] informing our office that pt was experiencing side effects that could be related to ofev.  Pt stated that she goes 3-4 days without a bowel movement. Pt states when this occurs she takes Miralax. After 3-4 days of taking the Miralax she develops diarrhea that lasts about 2 days. Pt is questioning if she should take miralax daily or just continue as needed.   DS please advise. Thanks.

## 2019-03-28 NOTE — Telephone Encounter (Signed)
Pt is aware of below recommendations and voiced her understanding. Nothing further is needed.  

## 2019-03-28 NOTE — Telephone Encounter (Signed)
Recommend Miralax daily and if this is too much (i.e. persistent diarrhea), she may take a half dose daily  Thanks  Waunita Schooner

## 2019-04-08 ENCOUNTER — Other Ambulatory Visit: Payer: Self-pay | Admitting: Internal Medicine

## 2019-04-08 DIAGNOSIS — S22000A Wedge compression fracture of unspecified thoracic vertebra, initial encounter for closed fracture: Secondary | ICD-10-CM

## 2019-04-19 ENCOUNTER — Ambulatory Visit
Admission: RE | Admit: 2019-04-19 | Discharge: 2019-04-19 | Disposition: A | Discharge status: Home / Self Care | Payer: Medicare Other | Source: Ambulatory Visit | Attending: Internal Medicine | Admitting: Internal Medicine

## 2019-04-19 ENCOUNTER — Other Ambulatory Visit: Payer: Self-pay

## 2019-04-19 DIAGNOSIS — S22000A Wedge compression fracture of unspecified thoracic vertebra, initial encounter for closed fracture: Secondary | ICD-10-CM | POA: Diagnosis present

## 2019-04-21 ENCOUNTER — Ambulatory Visit: Payer: Medicare Other

## 2019-04-21 DIAGNOSIS — T18128A Food in esophagus causing other injury, initial encounter: Secondary | ICD-10-CM

## 2019-04-21 DIAGNOSIS — K3189 Other diseases of stomach and duodenum: Secondary | ICD-10-CM

## 2019-04-21 DIAGNOSIS — K2100 Gastro-esophageal reflux disease with esophagitis, without bleeding: Secondary | ICD-10-CM

## 2019-04-22 ENCOUNTER — Other Ambulatory Visit: Payer: Self-pay

## 2019-04-22 ENCOUNTER — Other Ambulatory Visit: Payer: Self-pay | Admitting: Orthopedic Surgery

## 2019-04-22 ENCOUNTER — Other Ambulatory Visit
Admission: RE | Admit: 2019-04-22 | Discharge: 2019-04-22 | Disposition: A | Discharge status: Home / Self Care | Payer: Medicare Other | Source: Ambulatory Visit | Attending: Orthopedic Surgery | Admitting: Orthopedic Surgery

## 2019-04-22 DIAGNOSIS — I7300 Raynaud's syndrome without gangrene: Secondary | ICD-10-CM | POA: Diagnosis not present

## 2019-04-22 DIAGNOSIS — K219 Gastro-esophageal reflux disease without esophagitis: Secondary | ICD-10-CM | POA: Diagnosis not present

## 2019-04-22 DIAGNOSIS — S22060A Wedge compression fracture of T7-T8 vertebra, initial encounter for closed fracture: Secondary | ICD-10-CM

## 2019-04-22 DIAGNOSIS — J449 Chronic obstructive pulmonary disease, unspecified: Secondary | ICD-10-CM | POA: Diagnosis not present

## 2019-04-22 DIAGNOSIS — E785 Hyperlipidemia, unspecified: Secondary | ICD-10-CM | POA: Diagnosis not present

## 2019-04-22 DIAGNOSIS — I110 Hypertensive heart disease with heart failure: Secondary | ICD-10-CM | POA: Diagnosis not present

## 2019-04-22 DIAGNOSIS — Z7982 Long term (current) use of aspirin: Secondary | ICD-10-CM | POA: Diagnosis not present

## 2019-04-22 DIAGNOSIS — I739 Peripheral vascular disease, unspecified: Secondary | ICD-10-CM | POA: Diagnosis not present

## 2019-04-22 DIAGNOSIS — J8410 Pulmonary fibrosis, unspecified: Secondary | ICD-10-CM | POA: Diagnosis not present

## 2019-04-22 DIAGNOSIS — Z85820 Personal history of malignant melanoma of skin: Secondary | ICD-10-CM | POA: Diagnosis not present

## 2019-04-22 DIAGNOSIS — Z79899 Other long term (current) drug therapy: Secondary | ICD-10-CM | POA: Diagnosis not present

## 2019-04-22 DIAGNOSIS — I509 Heart failure, unspecified: Secondary | ICD-10-CM | POA: Diagnosis not present

## 2019-04-22 DIAGNOSIS — D649 Anemia, unspecified: Secondary | ICD-10-CM | POA: Diagnosis not present

## 2019-04-22 DIAGNOSIS — Z7952 Long term (current) use of systemic steroids: Secondary | ICD-10-CM | POA: Diagnosis not present

## 2019-04-22 DIAGNOSIS — I25119 Atherosclerotic heart disease of native coronary artery with unspecified angina pectoris: Secondary | ICD-10-CM | POA: Diagnosis not present

## 2019-04-22 DIAGNOSIS — M069 Rheumatoid arthritis, unspecified: Secondary | ICD-10-CM | POA: Diagnosis not present

## 2019-04-22 DIAGNOSIS — X58XXXA Exposure to other specified factors, initial encounter: Secondary | ICD-10-CM | POA: Diagnosis not present

## 2019-04-22 DIAGNOSIS — Z1159 Encounter for screening for other viral diseases: Secondary | ICD-10-CM | POA: Diagnosis not present

## 2019-04-23 LAB — NOVEL CORONAVIRUS, NAA (HOSP ORDER, SEND-OUT TO REF LAB; TAT 18-24 HRS): SARS-CoV-2, NAA: NOT DETECTED

## 2019-04-25 ENCOUNTER — Other Ambulatory Visit: Payer: Self-pay | Admitting: Cardiovascular Disease

## 2019-04-25 ENCOUNTER — Other Ambulatory Visit: Payer: Self-pay

## 2019-04-25 ENCOUNTER — Encounter
Admission: RE | Admit: 2019-04-25 | Discharge: 2019-04-25 | Disposition: A | Discharge status: Home / Self Care | Payer: Medicare Other | Source: Ambulatory Visit | Attending: Orthopedic Surgery | Admitting: Orthopedic Surgery

## 2019-04-25 HISTORY — DX: Other complications of anesthesia, initial encounter: T88.59XA

## 2019-04-25 MED ORDER — CEFAZOLIN SODIUM-DEXTROSE 2-4 GM/100ML-% IV SOLN
2.0000 g | Freq: Once | INTRAVENOUS | Status: AC
  Administered 2019-04-26: 2 g via INTRAVENOUS

## 2019-04-25 NOTE — Pre-Procedure Instructions (Signed)
Erin Mcardle, MD  Physician  Pulmonology  Progress Notes     Signed  Encounter Date:  02/07/2019          Signed         Show:Clear all [x] Manual[x] Template[x] Copied  Added by: [x] Erin Mcardle, MD  [] Hover for details PULMONARY OFFICE FOLLOW UP  NOTE  Requesting MD/Service: Rockey Situ. Primary MD: Doy Hutching. RheumJefm Bryant Date of initial consultation: 02/23/17 Reason for consultation: Scleroderma, pulmonary fibrosis, progressive DOE  PT PROFILE: 73 y.o. 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 Referred for evaluation of progressive DOE. Previously followed by Dr Raul Del  DATA: PFTs 2015: No obstruction, TLC 90%, DLCO 72% CT chest 10/01/15: Dilated esophagus consistent with the given history of scleroderma. This is also stable from the most recent prior CT. Lungs show changes of interstitial fibrosis, likely usual interstitial fibrosis, predominantly in the lower lungs, similar to the most recent prior CT CXR 11/18/16: Chronic interstitial changes are present at the lung bases 6MWT 03/04/17: 168 m, no desaturation. Patient stated that she felt like she couldn't get any air and started wheezing. Echocardiogram 03/31/17: Mild concentric LV hypertrophy with LVEF 55-60%. No evidence of pulmonary hypertension. 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  Mild LVH.  Left atrium mildly  dilated.  Right ventricle cavity size normal.  RV systolic function normal.  RVSP estimated 35-40 mmHg.     INTERVAL: Last seen 11/24/2018.  Overall has done well.  Suffered a "choking" episode while eating 2/23 which required performance of the Heimlich maneuver.  She was seen at [facility] clinic after that event but did not require further evaluation or hospitalization.  SUBJ:  This is a scheduled follow-up.  She has no new complaints.  She is tolerating Ofev well.  Initially she had some nausea but has learned to manage this.  She has had no problems with diarrhea.  She notes no significant change in respiratory status.  She continues to have moderate exertional dyspnea but notably is relatively sedentary.  She denies CP, fever, purulent sputum, hemoptysis, LE edema and calf tenderness.      Vitals:   02/07/19 1426  BP: (!) 142/80  Pulse: 86  SpO2: 98%  Weight: 192 lb (87.1 kg)  Height: 5\' 4"  (1.626 m)  RA  EXAM:  General: No overt distress HEENT: Multiple telangiectasias, no acute findings Neck: No JVD noted Lungs: Faint bibasilar crackles Cardiovascular: Regular, no M Abdomen: NT, NABS Extremities: Sclerodactyly, periungual telangiectasias, no edema Neuro: No focal deficits Skin: No acute findings  DATA:   BMP Latest Ref Rng & Units 03/18/2018 03/17/2018 03/02/2018  Glucose 65 - 99 mg/dL 78 140(H) 101(H)  BUN 6 - 20 mg/dL 16 15 38(H)  Creatinine 0.44 - 1.00 mg/dL 0.63 0.93 1.11(H)  Sodium 135 - 145 mmol/L 137 136 133(L)  Potassium 3.5 - 5.1 mmol/L 4.9 5.9(H) 4.4  Chloride 101 - 111 mmol/L 100(L) 99(L) 98(L)  CO2 22 -  32 mmol/L 31 26 25   Calcium 8.9 - 10.3 mg/dL 8.1(L) 8.7(L) 8.6(L)    CBC Latest Ref Rng & Units 03/18/2018 03/17/2018 03/02/2018  WBC 3.6 - 11.0 K/uL 16.3(H) 20.0(H) 19.6(H)  Hemoglobin 12.0 - 16.0 g/dL 10.4(L) 11.7(L) 10.8(L)  Hematocrit 35.0 - 47.0 % 32.5(L) 36.8 33.4(L)  Platelets 150 - 440 K/uL 353 492(H) 402    CXR: No new film  All  chest radiographic images have been reviewed by me  IMPRESSION:     ICD-10-CM   1. 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 Pulmonary fibrosis ([place]) J84.10 Pulmonary Function Test [facility] Only    PLAN:  Continue Ofev No other changes were made in her medical regimen LFTs were checked in February and are normal Follow-up in 3 months with repeat PFTs prior to that visit   Merton Border, MD PCCM service Mobile [phone] Pager [phone] 02/09/2019 9:26 AM         Electronically signed by Erin Mcardle, MD at 02/09/2019 9:33 AM     Office Visit on 02/07/2019       Detailed Report      Note shared with patient

## 2019-04-25 NOTE — Pre-Procedure Instructions (Addendum)
Messaged Dr Ola Spurr regarding pt with pulmonary fibrosis/scleroderma that f/u with Dr Rockey Situ and Dr Alva Garnet to see if clearance note would be needed from either MD for kypho tomorrow. Notes from both mds in Epic from March. Dr Ola Spurr reviewed notes and said ok to proceed with surgery with no clearance needed

## 2019-04-25 NOTE — Pre-Procedure Instructions (Signed)
Erin [name]  ECHO COMPLETE WO IMAGING ENHANCING AGENT  Order# [id number]  Reading physician: Nelva Bush, MD Ordering physician: Wilhelmina Mcardle, MD Study date: 10/26/18  ECHOCARDIOGRAM COMPLETE (Accession [id number]) (Order [id number])  Echocardiography  Date: 10/26/2018 Department: [place] Released By: Ace Gins Authorizing: Wilhelmina Mcardle, MD  Linked Results   Procedure Abnormality Status  ECHOCARDIOGRAM COMPLETE    ECHOCARDIOGRAM COMPLETE  Order #: [id number] Accession #: [id number]  Patient Info   Patient name: Erin [name]  MRN: [id number]  Age: 73 y.o.  Sex: female  Vitals   BP Height Weight BSA (Calculated - sq m)  130/70 5\' 4"  (1.626 m) 88.4 kg 2 sq meters  Study Result   Result status: Final result                           *[facility]                  [address]                        [place], [place] [address]                            [phone]  ------------------------------------------------------------------- Echocardiography  Patient:    Erin [name], Erin [name] MR #:       [id number] Study Date: 10/26/2018 Gender:     F Age:        73 Height:     162.6 cm Weight:     88.4 kg BSA:        2.03 m^2 Pt. Status: Room:   ATTENDING    Default, Provider [phone]  Harlene Ramus, [place], David B  PERFORMING   [place], [place]  SONOGRAPHER  Pilar Jarvis, RVT, RDCS, RDMS  cc:  ------------------------------------------------------------------- LV EF: 60% -   65%  ------------------------------------------------------------------- Indications:      Dyspnea (R06.00).  ------------------------------------------------------------------- History:   PMH:  Scleroderma  Dyspnea and murmur.  Risk factors:  Lifelong nonsmoker. Hypertension. Dyslipidemia.  ------------------------------------------------------------------- Study Conclusions  - Left ventricle: The  cavity size was normal. Wall thickness was   increased in a pattern of mild LVH. Systolic function was normal.   The estimated ejection fraction was in the range of 60% to 65%.   Left ventricular diastolic function parameters were normal. - Ascending aorta: The ascending aorta was mildly dilated. - Aortic arch: The aortic arch was mildly dilated. - Mitral valve: Calcified annulus. - Left atrium: The atrium was mildly dilated. - Right ventricle: The cavity size was normal. Wall thickness was   normal. Systolic function was normal. - Pulmonary arteries: Systolic pressure was mildly increased, in   the range of 35 mm Hg to 40 mm Hg.  ------------------------------------------------------------------- Study data:  The previous study was not available, so comparison was made to the report of April 2018.  Study status:  Routine. Procedure:  Transthoracic echocardiography. Image quality was fair.  Study completion:  There were no complications. Echocardiography.  M-mode, complete 2D, spectral Doppler, and color Doppler.  Birthdate:  Patient birthdate: 21-Nov-1946.  Age:  Patient is 73 yr old.  Sex:  Gender: female.    BMI: 33.4 kg/m^2.  Blood pressure:     128/72  Patient status:  Outpatient.  Study date: Study date: 10/26/2018. Study  time: 01:22 PM.  -------------------------------------------------------------------  ------------------------------------------------------------------- Left ventricle:  The cavity size was normal. Wall thickness was increased in a pattern of mild LVH. Systolic function was normal. The estimated ejection fraction was in the range of 60% to 65%. Images were inadequate for LV wall motion assessment. Left ventricular diastolic function parameters were normal.  ------------------------------------------------------------------- Aortic valve:   Trileaflet. Cusp separation was normal.  Doppler: Transvalvular velocity was within the normal range. There was  no stenosis. There was no regurgitation.    VTI ratio of LVOT to aortic valve: 0.86. Valve area (VTI): 2.7 cm^2. Indexed valve area (VTI): 1.33 cm^2/m^2. Mean velocity ratio of LVOT to aortic valve: 0.78. Valve area (Vmean): 2.46 cm^2. Indexed valve area (Vmean): 1.21 cm^2/m^2.    Mean gradient (S): 8 mm Hg.  ------------------------------------------------------------------- Aorta:  Aortic root: The aortic root was normal in size. Ascending aorta: The ascending aorta was mildly dilated. Aortic arch: The aortic arch was mildly dilated.  ------------------------------------------------------------------- Mitral valve:   Calcified annulus. Leaflet separation was normal. Doppler:  Transvalvular velocity was within the normal range. There was no evidence for stenosis. There was trivial regurgitation. Valve area by pressure half-time: 3.73 cm^2. Indexed valve area by pressure half-time: 1.83 cm^2/m^2.    Peak gradient (D): 3 mm Hg.   ------------------------------------------------------------------- Left atrium:  The atrium was mildly dilated.  ------------------------------------------------------------------- Right ventricle:  The cavity size was normal. Wall thickness was normal. Systolic function was normal.  ------------------------------------------------------------------- Pulmonic valve:    Structurally normal valve.   Cusp separation was normal.  Doppler:  Transvalvular velocity was within the normal range. There was no regurgitation.  ------------------------------------------------------------------- Tricuspid valve:  Poorly visualized.  Doppler:  There was trivial regurgitation.  ------------------------------------------------------------------- Pulmonary artery:   The main pulmonary artery was normal-sized. Systolic pressure was mildly increased, in the range of 35 mm Hg to 40 mm Hg.  ------------------------------------------------------------------- Right  atrium:  The atrium was normal in size.  ------------------------------------------------------------------- Pericardium:  There was no pericardial effusion.  ------------------------------------------------------------------- Systemic veins: Inferior vena cava: The vessel was small, appearing collapsed, consistent with low central venous pressure.  ------------------------------------------------------------------- Measurements   Left ventricle                           Value          Reference  LV ID, ED, PLAX chordal          (L)     40    mm       43 - 52  LV ID, ES, PLAX chordal                  23    mm       23 - 38  LV fx shortening, PLAX chordal           43    %        >=29  LV PW thickness, ED                      12    mm       ----------  IVS/LV PW ratio, ED                      0.92           <=1.3  Stroke volume, 2D  98    ml       ----------  Stroke volume/bsa, 2D                    48    ml/m^2   ----------  LV ejection fraction, 1-p A4C            62    %        ----------  LV e&', lateral                           12.1  cm/s     ----------  LV E/e&', lateral                         7.69           ----------  LV e&', medial                            8.49  cm/s     ----------  LV E/e&', medial                          10.97          ----------  LV e&', average                           10.3  cm/s     ----------  LV E/e&', average                         9.04           ----------    Ventricular septum                       Value          Reference  IVS thickness, ED                        11    mm       ----------    LVOT                                     Value          Reference  LVOT ID, S                               20    mm       ----------  LVOT area                                3.14  cm^2     ----------  LVOT ID                                  20    mm       ----------  LVOT mean velocity, S                    95.5  cm/s      ----------  LVOT VTI, S                              31.1  cm       ----------  Stroke volume (SV), LVOT DP              97.7  ml       ----------  Stroke index (SV/bsa), LVOT DP           48.1  ml/m^2   ----------    Aortic valve                             Value          Reference  Aortic valve mean velocity, S            122   cm/s     ----------  Aortic valve VTI, S                      36.2  cm       ----------  Aortic mean gradient, S                  8     mm Hg    ----------  VTI ratio, LVOT/AV                       0.86           ----------  Aortic valve area, VTI                   2.7   cm^2     ----------  Aortic valve area/bsa, VTI               1.33  cm^2/m^2 ----------  Velocity ratio, mean, LVOT/AV            0.78           ----------  Aortic valve area, mean velocity         2.46  cm^2     ----------  Aortic valve area/bsa, mean              1.21  cm^2/m^2 ----------  velocity    Aorta                                    Value          Reference  Aortic root ID, ED                       27    mm       ----------  Ascending aorta ID, A-P, S               37    mm       ----------  Aortic arch ID, max                      31    mm       ----------    Left atrium                              Value  Reference  LA ID, A-P, ES                           39    mm       ----------  LA ID/bsa, A-P                           1.92  cm/m^2   <=2.2  LA volume, S                             67.4  ml       ----------  LA volume/bsa, S                         33.2  ml/m^2   ----------  LA volume, ES, 1-p A4C                   74    ml       ----------  LA volume/bsa, ES, 1-p A4C               36.4  ml/m^2   ----------  LA volume, ES, 1-p A2C                   56.5  ml       ----------  LA volume/bsa, ES, 1-p A2C               27.8  ml/m^2   ----------    Mitral valve                             Value          Reference  Mitral E-wave peak velocity              93.1  cm/s      ----------  Mitral A-wave peak velocity              86.8  cm/s     ----------  Mitral deceleration time                 201   ms       150 - 230  Mitral pressure half-time                59    ms       ----------  Mitral peak gradient, D                  3     mm Hg    ----------  Mitral E/A ratio, peak                   1.1            ----------  Mitral valve area, PHT, DP               3.73  cm^2     ----------  Mitral valve area/bsa, PHT, DP           1.83  cm^2/m^2 ----------    Right atrium                             Value          Reference  RA ID, S-I, ES, A4C                      48.9  mm       34 - 49  RA area, ES, A4C                         13.8  cm^2     8.3 - 19.5  RA volume, ES, A/L                       31.8  ml       ----------  RA volume/bsa, ES, A/L                   15.6  ml/m^2   ----------    Right ventricle                          Value          Reference  TAPSE                                    25.2  mm       ----------  RV s&', lateral, S                        15.4  cm/s     ----------    Pulmonic valve                           Value          Reference  Pulmonic valve peak velocity, S          102   cm/s     ----------  Legend: (L)  and  (H)  mark values outside specified reference range.  ------------------------------------------------------------------- Prepared and Electronically Authenticated by  Nelva Bush, MD 2019-11-19T18:32:06  [employer] Images   Show images for ECHOCARDIOGRAM COMPLETE  Images on [employer]   Show images for Orlando Penner  Performing Technologist/Nurse   Performing Technologist/Nurse: Baruch Goldmann  Reason for Exam  Priority: Routine  Dx: PSS (progressive systemic sclerosis) ([place]) [M34.0 (ICD-10-CM)]; Dyspnea, unspecified type [R06.00 (ICD-10-CM)]  Comments: Scleroderma, progressive dyspnea and LE edema. Re-evaluate for pulmonary hypertension                       Surgical History   Surgical  History   No past medical history on file.    Other Surgical History   Procedure Laterality Date Comment Source  ABDOMINAL HYSTERECTOMY    Provider  BREAST CYST EXCISION Left 20+ years ago No scar visible Provider  BREAST LUMPECTOMY Right   Provider  CARPAL TUNNEL RELEASE Bilateral   Provider  COLON SURGERY   colon polyp  Provider  COLONOSCOPY  09/30/2011 tubular adenoma rtm Provider  COLONOSCOPY   05/02/2003, 04/10/2000 Provider  COLONOSCOPY WITH PROPOFOL N/A 01/20/2017 Procedure: COLONOSCOPY WITH PROPOFOL; Surgeon: Lollie Sails, MD; Location: [facility] ENDOSCOPY; Service: Endoscopy; Laterality: N/A; Provider  ESOPHAGOGASTRODUODENOSCOPY    Provider  ESOPHAGOGASTRODUODENOSCOPY   09/30/2011, 04/10/2000 , no repeat rtm Provider  ESOPHAGOGASTRODUODENOSCOPY N/A 03/02/2018 Procedure: ESOPHAGOGASTRODUODENOSCOPY (EGD); Surgeon: Virgel Manifold, MD; Location[facility] ENDOSCOPY; Service: Endoscopy; Laterality: N/A; Provider  ESOPHAGOGASTRODUODENOSCOPY (EGD) WITH PROPOFOL N/A  01/20/2017 Procedure: ESOPHAGOGASTRODUODENOSCOPY (EGD) WITH PROPOFOL; Surgeon: Lollie Sails, MD; Location: [facility] ENDOSCOPY; Service: Endoscopy; Laterality: N/A; Provider  EXCISION HYDRADENITIS LABIA Left 11/19/2016 Procedure: EXCISION LABIAL MASS; Surgeon: Robert Bellow, MD; Location: [facility] ORS; Service: General; Laterality: Left; Provider  EYE SURGERY Bilateral  cataracts Provider  HEEL SPUR EXCISION    Provider  HIP ARTHROPLASTY Left 02/23/2018 Procedure: ARTHROPLASTY BIPOLAR HIP (HEMIARTHROPLASTY); Surgeon: Thornton Park, MD; Location: [facility] ORS; Service: Orthopedics; Laterality: Left; Provider  MASS EXCISION Left 11/19/2016 Procedure: EXCISION LEFT THIGH MELANOMA; Surgeon: Robert Bellow, MD; Location: [facility] ORS; Service: General; Laterality: Left; Provider  SENTINEL NODE BIOPSY Left 11/19/2016 Procedure: INGUINAL SENTINEL NODE BIOPSY; Surgeon: Robert Bellow, MD; Location: [facility] ORS; Service:  General; Laterality: Left; Provider  UPPER ESOPHAGEAL ENDOSCOPIC ULTRASOUND (EUS) N/A 01/29/2017 Procedure: UPPER ESOPHAGEAL ENDOSCOPIC ULTRASOUND (EUS); Surgeon: Holly Bodily, MD; Location: [facility] ENDOSCOPY; Service: Endoscopy; Laterality: N/A; Provider    Implants    No active implants to display in this view.  Order-Level Documents:   There are no order-level documents.  Encounter-Level Documents - 10/26/2018:   Electronic signature on 10/26/2018 1:43 PM - Princess Perna      Resulted by:   Signed Date/Time  Phone Pager  END, [place] 10/26/2018 6:32 PM [phone]   External Result Report   External Result Report

## 2019-04-25 NOTE — Pre-Procedure Instructions (Signed)
Erin Mcardle, MD  Physician  Pulmonology  Progress Notes     Signed  Encounter Date:  10/13/2018          Signed         Show:Clear all [x] Manual[x] Template[x] Copied  Added by: [x] Erin Mcardle, MD  [] Hover for details PULMONARY OFFICE FOLLOW UP  NOTE  Requesting MD/Service: Rockey Situ. Primary MD: Doy Hutching. RheumJefm Bryant Date of initial consultation: 02/23/17 Reason for consultation: Scleroderma, pulmonary fibrosis, progressive DOE  PT PROFILE: 73 y.o. 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 Previously followed by Dr Raul Del  DATA: PFTs 2015: No obstruction, TLC 90%, DLCO 72% CT chest 10/01/15: Dilated esophagus consistent with the given history of scleroderma. This is also stable from the most recent prior CT. Lungs show changes of interstitial fibrosis, likely usual interstitial fibrosis, predominantly in the lower lungs, similar to the most recent prior CT CXR 11/18/16: Chronic interstitial changes are present at the lung bases 6MWT 03/04/17: 168 m, no desaturation. Patient stated that she felt like she couldn't get any air and started wheezing. Echocardiogram 03/31/17: Mild concentric LV hypertrophy with LVEF 55-60%. No evidence of pulmonary hypertension. PFTs 06/02/17 FVC: 2.46 L (97 %pred), FEV1: 1.83 L ( 91 %pred), FEV1/FVC: 74% , TLC:  4.06 L ( 97 %pred), DLCO 54%pred CXR 06/02/17: Slight progression in pulmonary fibrosis particularly in the upper lobes when compared with the prior exam PFT 10/05/18: FVC: 1.97 L (76 %pred), FEV1: 1.63 L (81 %pred), FEV1/FVC: 83%, TLC: 3.74 L (78 %pred), DLCO 63 %pred   INTERVAL: Was scheduled for back surgery in March.  However suffered fall and left hip fracture requiring ORIF and prolonged rehabilitation (SNF x1 month).  Subsequently suffered another fall several weeks ago with blunt trauma to right hemithorax.  SUBJ:  This is a  scheduled follow-up.  She is here to review results of PFTs and repeat CXR.  She is less active and ambulatory now after her recent falls and injuries.  She is now ambulating with a walker.  She is more limited by her physical incapacity than by respiratory limitation.  Since last seen, she has had problems with increasing lower extremity edema.  She has been treated by vascular surgery with some vascular ablation procedure.  She has also on diuretic therapy with improvement in this.  She denies significant cough and sputum production.  She has no fevers, no hemoptysis, no pleuritic chest pain.       Vitals:   10/13/18 1441 10/13/18 1443  BP:  130/70  Pulse:  82  SpO2:  98%  Weight: 194 lb 12.8 oz (88.4 kg)   Height: 5\' 4"  (1.626 m)   RA  EXAM:  Gen: Somewhat frail-appearing, no overt distress HEENT: No acute findings Neck: JVP not visualized Lungs: L>R bibasilar crackles Cardiovascular: Reg, no murmurs Abdomen: Soft, nontender, normal BS Ext: sclerodactyly, telangiectasias, no edema Neuro: No focal deficits Skin: innumerable telangiectasias on face and torso  DATA:   BMP Latest Ref Rng & Units 03/18/2018 03/17/2018 03/02/2018  Glucose 65 - 99 mg/dL 78 140(H) 101(H)  BUN 6 - 20 mg/dL 16 15 38(H)  Creatinine 0.44 - 1.00 mg/dL 0.63 0.93 1.11(H)  Sodium 135 - 145 mmol/L 137 136 133(L)  Potassium 3.5 - 5.1 mmol/L 4.9 5.9(H) 4.4  Chloride 101 - 111 mmol/L 100(L) 99(L) 98(L)  CO2 22 - 32 mmol/L 31 26 25   Calcium 8.9 - 10.3 mg/dL 8.1(L)  8.7(L) 8.6(L)    CBC Latest Ref Rng & Units 03/18/2018 03/17/2018 03/02/2018  WBC 3.6 - 11.0 K/uL 16.3(H) 20.0(H) 19.6(H)  Hemoglobin 12.0 - 16.0 g/dL 10.4(L) 11.7(L) 10.8(L)  Hematocrit 35.0 - 47.0 % 32.5(L) 36.8 33.4(L)  Platelets 150 - 440 K/uL 353 492(H) 402    CXR 10/29:  Chronic lung changes with emphysema and pulmonary scarring most notable at the lung bases. Nodular opacity in the right lower lobe not well demonstrated on the  lateral film. This could be a focus of atelectasis or infiltrate  IMPRESSION:     ICD-10-CM   1. 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 Pulmonary fibrosis ([place]) J84.10   3. Progressive dyspnea R06.00   4. Restrictive pattern present on pulmonary function testing R94.2   5. Interstitial pulmonary disease (HCC) J84.9 CT Chest High Resolution   There is a 10-20% reduction in most of her PFT measurements.  I am concerned about progressive pulmonary fibrosis.  Also, with the worsening lower extremity edema, I am concerned about the possibility of the development of pulmonary hypertension   PLAN:  HRCT chest ordered to evaluate for pulmonary fibrosis Echocardiogram ordered to evaluate for pulmonary hypertension We will contact her if there are any critical results that need to be addressed urgently She will follow-up in 4 to 6 weeks to discuss the results of the above and discuss further therapies  Of note, she is quite frail and has minimal assistance in her home.  I offered that we could contact social services to see if there are any community services that might be provided to her to alleviate some of the burden that is imposed by her multiple medical problems  Merton Border, MD PCCM service Mobile [phone] Pager [phone] 10/13/2018 3:19 PM           Electronically signed by Erin Mcardle, MD at 10/13/2018 3:20 PM     Office Visit on 10/13/2018       Detailed Report      Note shared with patient

## 2019-04-25 NOTE — Pre-Procedure Instructions (Signed)
Erin Merritts, MD  Physician  Cardiology  Progress Notes  Signed  Encounter Date:  02/09/2019          Signed      Expand All Collapse All    Show:Clear all [x] Manual[x] Template[x] Copied  Added by: [x] Erin Merritts, MD  [] Hover for details Cardiology Office Note  Date:  02/09/2019   ID:  GWENDLOYN FORSEE, DOB Aug 01, 1946, MRN [id number]  PCP:  Idelle Crouch, MD        Chief Complaint  Patient presents with  . Other    OD 6 month follow up. Patietn c/o SOB. Patient was last seen 03/2018. Meds reviewed verbally with patient.     HPI:  Ms. Fifer is a very pleasant 73 year old woman with history of  [name], Seen on CT scan chest October 2016 mid to distal LAD region Scleroderma,  raynauds disease (toes, hands), arthritis,  chronic interstitial lung disease at the bases seen on CT scan chest x-ray,  who presents for worsening shortness of breath on exertion, [name]  She reports having severe complications from her scleroderma Wounds on her hands over the knuckles are not healing, often will weep Takes amlodipine twice a day for raynauds Difficulty swallowing food, several episodes of severe choking Lady in a restaurant gave her the Heimlich maneuver, broke a rib Chronic shortness of breath Maintained on low-dose prednisone,  follows with [facility] for pulm issues She takes Lasix 20 mg daily  Gait instability No falls  Lab work reviewed with her in detail CBC normal CHem normal Total chol 185, LDL 75  Echo 10/2018 reviewed with her Left ventricle: mild LVH. Systolic function was normal. The estimated ejection fraction was in the range of 60% to 65%. Left ventricular diastolic function parameters were normal. - Ascending aorta: The ascending aorta was mildly dilated.  Systolic pressure was mildly increased, in the range of 35 mm Hg to 40 mm Hg.  EKG personally reviewed by myself on todays visit Shows normal sinus rhythm with rate 90 bpm  APCs, left axis deviation Other past medical history reviewed Several recent hospitalizations March 2019 Had fall with left femoral fracture Surgical repair, subsequent anemia Fall was mechanical, down some steps  Readmitted to the hospital several days later with worsening anemia At EGD showing small region of erosion Started on iron,  Blood transfusions for hemoglobin of 6 She reports worsening lower extremity edema Albumin/protein low  Stress test 12/19/2016 No ischemia, EF >55%  Echo 03/2017 Normal ejection fraction  Spinal stenosis Disk problems Walking with a cane and walker Was told not a candidate for surgery,  Previous CXR showing chronic interstitial changes are present at the lung bases.  CT scan results and images  mid to distal LAD coronary calcifications No significant thoracic descending  or ascending aortic atherosclerosis It does show fibrosis at the bases of the lungs bilaterally perhaps worse on the left than the right   PMH:   has a past medical history of Anemia, Cancer ([place]), Colon polyp, Coronary artery calcification, Coronary artery disease, DDD (degenerative disc disease), lumbar, Diverticulosis (01/20/2017), Dysphagia, Dyspnea, Environmental allergies, Esophageal dysmotility, Fibrocystic breast disease, GERD (gastroesophageal reflux disease), Heart murmur, Hyperlipidemia, Hypertension, Mitral regurgitation, Monilial esophagitis ([place]) (01/20/2017), Obstipation, Osteopenia, Peptic ulcer disease, Pulmonary fibrosis ([place]), RA (rheumatoid arthritis) ([place]), Raynaud's disease, Redundant colon (01/20/2017), Scleroderma ([place]), Telangiectasia of colon, Tubular adenoma of colon (01/20/2017), and Vulvar dysplasia.  PSH:         Past Surgical History:  Procedure Laterality Date  .  ABDOMINAL HYSTERECTOMY    . BREAST CYST EXCISION Left 20+ years ago   No scar visible  . BREAST LUMPECTOMY Right   . CARPAL TUNNEL RELEASE Bilateral   . COLON SURGERY      colon polyp   . COLONOSCOPY  09/30/2011   tubular adenoma rtm  . COLONOSCOPY     05/02/2003, 04/10/2000  . COLONOSCOPY WITH PROPOFOL N/A 01/20/2017   Procedure: COLONOSCOPY WITH PROPOFOL;  Surgeon: Lollie Sails, MD;  Location: [facility] ENDOSCOPY;  Service: Endoscopy;  Laterality: N/A;  . ESOPHAGOGASTRODUODENOSCOPY    . ESOPHAGOGASTRODUODENOSCOPY     09/30/2011, 04/10/2000 , no repeat rtm  . ESOPHAGOGASTRODUODENOSCOPY N/A 03/02/2018   Procedure: ESOPHAGOGASTRODUODENOSCOPY (EGD);  Surgeon: Virgel Manifold, MD;  Location: [facility] ENDOSCOPY;  Service: Endoscopy;  Laterality: N/A;  . ESOPHAGOGASTRODUODENOSCOPY (EGD) WITH PROPOFOL N/A 01/20/2017   Procedure: ESOPHAGOGASTRODUODENOSCOPY (EGD) WITH PROPOFOL;  Surgeon: Lollie Sails, MD;  Location: [facility] ENDOSCOPY;  Service: Endoscopy;  Laterality: N/A;  . EXCISION HYDRADENITIS LABIA Left 11/19/2016   Procedure: EXCISION LABIAL MASS;  Surgeon: Robert Bellow, MD;  Location: [facility] ORS;  Service: General;  Laterality: Left;  . EYE SURGERY Bilateral    cataracts  . HEEL SPUR EXCISION    . HIP ARTHROPLASTY Left 02/23/2018   Procedure: ARTHROPLASTY BIPOLAR HIP (HEMIARTHROPLASTY);  Surgeon: Thornton Park, MD;  Location: [facility] ORS;  Service: Orthopedics;  Laterality: Left;  Marland Kitchen MASS EXCISION Left 11/19/2016   Procedure: EXCISION LEFT THIGH MELANOMA;  Surgeon: Robert Bellow, MD;  Location: [facility] ORS;  Service: General;  Laterality: Left;  . SENTINEL NODE BIOPSY Left 11/19/2016   Procedure: INGUINAL SENTINEL NODE BIOPSY;  Surgeon: Robert Bellow, MD;  Location: [facility] ORS;  Service: General;  Laterality: Left;  . UPPER ESOPHAGEAL ENDOSCOPIC ULTRASOUND (EUS) N/A 01/29/2017   Procedure: UPPER ESOPHAGEAL ENDOSCOPIC ULTRASOUND (EUS);  Surgeon: Holly Bodily, MD;  Location: [facility] ENDOSCOPY;  Service: Endoscopy;  Laterality: N/A;          Current Outpatient Medications  Medication Sig Dispense Refill  . ALPRAZolam (XANAX)  0.5 MG tablet Take 0.5 mg by mouth 4 (four) times daily as needed for anxiety.    Marland Kitchen amLODipine (NORVASC) 10 MG tablet Take 10 mg by mouth daily.    Marland Kitchen aspirin 81 MG tablet Take 81 mg by mouth daily.     Marland Kitchen dexlansoprazole (DEXILANT) 60 MG capsule Take 1 capsule (60 mg total) by mouth daily. 30 capsule 0  . DULoxetine (CYMBALTA) 30 MG capsule 1 capsule daily for 2 weeks, then increase to 2 capsules daily    . ezetimibe (ZETIA) 10 MG tablet TAKE ONE TABLET BY MOUTH EVERY DAY 30 tablet 1  . furosemide (LASIX) 20 MG tablet 20 mg daily.     Marland Kitchen gabapentin (NEURONTIN) 300 MG capsule Take 300 mg by mouth 3 (three) times daily.    . hydroxychloroquine (PLAQUENIL) 200 MG tablet Take 200 mg by mouth 2 (two) times daily.     Marland Kitchen latanoprost (XALATAN) 0.005 % ophthalmic solution Place 1 drop into both eyes at bedtime.     . Nintedanib (OFEV) 100 MG CAPS Take 1 capsule (100 mg total) by mouth 2 (two) times daily. 60 capsule 11  . Omega-3 Fatty Acids (FISH OIL) 1000 MG CAPS Take 1 capsule by mouth 2 (two) times daily.     . polyethylene glycol (MIRALAX / GLYCOLAX) packet Take 17 g by mouth daily.     . predniSONE (DELTASONE) 5 MG tablet Take 5 mg by mouth  daily with breakfast.     . simvastatin (ZOCOR) 20 MG tablet Take 20 mg by mouth at bedtime.    Marland Kitchen zolpidem (AMBIEN) 5 MG tablet Take 1 tablet (5 mg total) by mouth at bedtime as needed. 14 tablet 0   No current facility-administered medications for this visit.      Allergies:   Remicade [infliximab]; Sulfa antibiotics; Actonel [risedronate sodium]; Fosamax [alendronate sodium]; Procardia [nifedipine]; Sulfur; and Wellbutrin [bupropion]   Social History:  The patient  reports that she has never smoked. She has never used smokeless tobacco. She reports that she does not drink alcohol or use drugs.   Family History:   family history includes Bladder Cancer in her father; COPD in her father; Cerebral palsy in her sister; Endometrial  cancer (age of onset: 31) in her mother; Hypertension in her mother; Osteoporosis in her mother.    Review of Systems: Review of Systems  Respiratory: Negative.   Cardiovascular: Positive for leg swelling.  Gastrointestinal: Negative.   Musculoskeletal: Positive for falls.       Leg weakness  Neurological: Positive for weakness.  Psychiatric/Behavioral: Negative.   All other systems reviewed and are negative.    PHYSICAL EXAM: VS:  BP 126/68 (BP Location: Left Arm, Patient Position: Sitting, Cuff Size: Normal)   Pulse 90   Ht 5\' 2"  (1.575 m)   Wt 190 lb (86.2 kg)   BMI 34.75 kg/m  , BMI Body mass index is 34.75 kg/m. Constitutional:  oriented to person, place, and time. No distress.  HENT:  Head: Grossly normal Eyes:  no discharge. No scleral icterus.  Neck: No JVD, no carotid bruits  Cardiovascular: Regular rate and rhythm, no murmurs appreciated Pulmonary/Chest: Clear to auscultation bilaterally, Rales at the bases halfway up bilaterally Abdominal: Soft.  no distension.  no tenderness.  Musculoskeletal: Normal range of motion Neurological:  normal muscle tone. Coordination normal. No atrophy Skin: Cracked skin on the hands bilaterally, sores on the knuckles Psychiatric: normal affect, pleasant   Recent Labs: 02/26/2018: B Natriuretic Peptide 84.0 03/18/2018: BUN 16; Creatinine, Ser 0.63; Hemoglobin 10.4; Platelets 353; Potassium 4.9; Sodium 137 11/24/2018: ALT 24    Lipid Panel RecentLabs  No results found for: CHOL, HDL, LDLCALC, TRIG         Wt Readings from Last 3 Encounters:  02/09/19 190 lb (86.2 kg)  02/07/19 192 lb (87.1 kg)  11/24/18 194 lb (88 kg)      ASSESSMENT AND PLAN:  Shortness of breath  interstitial fibrosis, not on oxygen Previous normal stress test, echocardiogram with normal ejection fraction diastolic CHF , on Lasix daily Previously was on Lasix 40 daily now only taking 20 daily.  Stable renal function Suggested she  could take 40 alternating with 20 mg as needed for shortness of breath or leg swelling  Idiopathic interstitial fibrosis of lung syndrome (HCC) Followed by pulmonary,  Severe disease, sedentary lifestyle  Scleroderma ([place]) Followed by rheumatology On chronic prednisone  Raynaud's disease without gangrene Reports she is taking 2 prednisone a day Likely contributing to some lower extremity edema Difficulty with wound healing  [name]  Seen on CT scan chest October 2016 mid to distal LAD region No anginal symptoms, no further testing at this time  Leg swelling On Lasix 20 daily, suggested she could increase up to 40 alternating with 20 every other day Symptoms also likely exacerbated by amlodipine She is requiring amlodipine for ray nodes   Total encounter time more than 25 minutes  Greater than  50% was spent in counseling and coordination of care with the patient   Disposition:   F/U  12 months      Orders Placed This Encounter  Procedures  . EKG 12-Lead     Signed, Esmond Plants, M.D., Ph.D. 02/09/2019  [place], [place] [phone]          Electronically signed by Erin Merritts, MD at 02/09/2019 7:05 PM     Office Visit on 02/09/2019       Detailed Report

## 2019-04-25 NOTE — Patient Instructions (Signed)
Your procedure is scheduled on: 04-26-19  Report to Same Day Surgery 2nd floor medical mall [facility] Entrance-take elevator on left to 2nd floor.  Check in with surgery information desk.) To find out your arrival time please call [phone] between 1PM - 3PM on 04-25-19  Remember: Instructions that are not followed completely may result in serious medical risk, up to and including death, or upon the discretion of your surgeon and anesthesiologist your surgery may need to be rescheduled.    _x___ 1. Do not eat food after midnight the night before your procedure. You may drink clear liquids up to 2 hours before you are scheduled to arrive at the hospital for your procedure.  Do not drink clear liquids within 2 hours of your scheduled arrival to the hospital.  Clear liquids include  --Water or Apple juice without pulp  --Clear carbohydrate beverage such as ClearFast or Gatorade  --Black Coffee or Clear Tea (No milk, no creamers, do not add anything to the coffee or Tea   ____Ensure clear carbohydrate drink on the way to the hospital for bariatric patients  ____Ensure clear carbohydrate drink 3 hours before surgery for Dr Dwyane Luo patients if physician instructed.   No gum chewing or hard candies.     __x__ 2. No Alcohol for 24 hours before or after surgery.   __x__3. No Smoking or e-cigarettes for 24 prior to surgery.  Do not use any chewable tobacco products for at least 6 hour prior to surgery   ____  4. Bring all medications with you on the day of surgery if instructed.    __x__ 5. Notify your doctor if there is any change in your medical condition     (cold, fever, infections).    x___6. On the morning of surgery brush your teeth with toothpaste and water.  You may rinse your mouth with mouth wash if you wish.  Do not swallow any toothpaste or mouthwash.   Do not wear jewelry, make-up, hairpins, clips or nail polish.  Do not wear lotions, powders, or perfumes. You may wear  deodorant.  Do not shave 48 hours prior to surgery. Men may shave face and neck.  Do not bring valuables to the hospital.    [facility] is not responsible for any belongings or valuables.               Contacts, dentures or bridgework may not be worn into surgery.  Leave your suitcase in the car. After surgery it may be brought to your room.  For patients admitted to the hospital, discharge time is determined by your treatment team.  _  Patients discharged the day of surgery will not be allowed to drive home.  You will need someone to drive you home and stay with you the night of your procedure.    Please read over the following fact sheets that you were given:   [facility] Preparing for Surgery   _x___ TAKE THE FOLLOWING MEDICATION THE MORNING OF SURGERY WITH A SMALL SIP OF WATER. These include:  1. DEXILANT  2. AMLODIPINE  3. CYMBALTA   4. GABAPENTIN   5. PREDNISONE  6.PT CANNOT TAKE OFEV AM OF SURGERY DUE TO HAVING TO TAKE WITH YOGURT OR IT CAN ERODE INTESTINES PER PT  ____Fleets enema or Magnesium Citrate as directed.   ____ Use CHG Soap or sage wipes as directed on instruction sheet   ____ Use inhalers on the day of surgery and bring to hospital day  of surgery  ____ Stop Metformin and Janumet 2 days prior to surgery.    ____ Take 1/2 of usual insulin dose the night before surgery and none on the morning surgery.   _x___ Follow recommendations from Cardiologist, Pulmonologist or PCP regarding stopping Aspirin, Coumadin, Plavix ,Eliquis, Effient, or Pradaxa, and Pletal. PT LAST HAD HER ASPIRIN THIS AM (04-25-19)  X____Stop Anti-inflammatories such as Advil, Aleve, Ibuprofen, Motrin, Naproxen, Naprosyn, Goodies powders or aspirin products NOW-OK to take Tylenol    _x___ Stop supplements until after surgery-PT LAST HAD FISH OIL THIS AM (04-25-19) INSTRUCTED NOT TO TAKE PM DOSE   ____ Bring C-Pap to the hospital.

## 2019-04-26 ENCOUNTER — Ambulatory Visit: Payer: Medicare Other | Admitting: Anesthesiology

## 2019-04-26 ENCOUNTER — Ambulatory Visit
Admission: RE | Admit: 2019-04-26 | Discharge: 2019-04-26 | Disposition: A | Discharge status: Home / Self Care | Payer: Medicare Other | Attending: Orthopedic Surgery | Admitting: Orthopedic Surgery

## 2019-04-26 ENCOUNTER — Ambulatory Visit: Payer: Medicare Other

## 2019-04-26 ENCOUNTER — Other Ambulatory Visit: Payer: Self-pay

## 2019-04-26 ENCOUNTER — Encounter
Admission: RE | Disposition: A | Discharge status: Home / Self Care | Payer: Self-pay | Source: Home / Self Care | Attending: Orthopedic Surgery

## 2019-04-26 DIAGNOSIS — Z1159 Encounter for screening for other viral diseases: Secondary | ICD-10-CM | POA: Insufficient documentation

## 2019-04-26 DIAGNOSIS — I25119 Atherosclerotic heart disease of native coronary artery with unspecified angina pectoris: Secondary | ICD-10-CM | POA: Insufficient documentation

## 2019-04-26 DIAGNOSIS — M069 Rheumatoid arthritis, unspecified: Secondary | ICD-10-CM | POA: Insufficient documentation

## 2019-04-26 DIAGNOSIS — Z79899 Other long term (current) drug therapy: Secondary | ICD-10-CM | POA: Insufficient documentation

## 2019-04-26 DIAGNOSIS — D649 Anemia, unspecified: Secondary | ICD-10-CM | POA: Insufficient documentation

## 2019-04-26 DIAGNOSIS — I509 Heart failure, unspecified: Secondary | ICD-10-CM | POA: Insufficient documentation

## 2019-04-26 DIAGNOSIS — K219 Gastro-esophageal reflux disease without esophagitis: Secondary | ICD-10-CM | POA: Insufficient documentation

## 2019-04-26 DIAGNOSIS — X58XXXA Exposure to other specified factors, initial encounter: Secondary | ICD-10-CM | POA: Insufficient documentation

## 2019-04-26 DIAGNOSIS — Z7952 Long term (current) use of systemic steroids: Secondary | ICD-10-CM | POA: Insufficient documentation

## 2019-04-26 DIAGNOSIS — S22060A Wedge compression fracture of T7-T8 vertebra, initial encounter for closed fracture: Secondary | ICD-10-CM | POA: Diagnosis not present

## 2019-04-26 DIAGNOSIS — I739 Peripheral vascular disease, unspecified: Secondary | ICD-10-CM | POA: Insufficient documentation

## 2019-04-26 DIAGNOSIS — Z419 Encounter for procedure for purposes other than remedying health state, unspecified: Secondary | ICD-10-CM

## 2019-04-26 DIAGNOSIS — J449 Chronic obstructive pulmonary disease, unspecified: Secondary | ICD-10-CM | POA: Insufficient documentation

## 2019-04-26 DIAGNOSIS — E785 Hyperlipidemia, unspecified: Secondary | ICD-10-CM | POA: Insufficient documentation

## 2019-04-26 DIAGNOSIS — Z7982 Long term (current) use of aspirin: Secondary | ICD-10-CM | POA: Insufficient documentation

## 2019-04-26 DIAGNOSIS — Z85820 Personal history of malignant melanoma of skin: Secondary | ICD-10-CM | POA: Insufficient documentation

## 2019-04-26 DIAGNOSIS — I7300 Raynaud's syndrome without gangrene: Secondary | ICD-10-CM | POA: Insufficient documentation

## 2019-04-26 DIAGNOSIS — J8410 Pulmonary fibrosis, unspecified: Secondary | ICD-10-CM | POA: Insufficient documentation

## 2019-04-26 DIAGNOSIS — I110 Hypertensive heart disease with heart failure: Secondary | ICD-10-CM | POA: Insufficient documentation

## 2019-04-26 HISTORY — PX: KYPHOPLASTY: SHX5884

## 2019-04-26 LAB — POCT I-STAT 4, (NA,K, GLUC, HGB,HCT)
Glucose, Bld: 91 mg/dL (ref 70–99)
HCT: 41 % (ref 36.0–46.0)
Hemoglobin: 13.9 g/dL (ref 12.0–15.0)
Potassium: 4.1 mmol/L (ref 3.5–5.1)
Sodium: 137 mmol/L (ref 135–145)

## 2019-04-26 SURGERY — KYPHOPLASTY
Anesthesia: General | Site: Back

## 2019-04-26 MED ORDER — PROPOFOL 500 MG/50ML IV EMUL
INTRAVENOUS | Status: DC | PRN
  Administered 2019-04-26: 25 ug/kg/min via INTRAVENOUS

## 2019-04-26 MED ORDER — BUPIVACAINE-EPINEPHRINE (PF) 0.5% -1:200000 IJ SOLN
INTRAMUSCULAR | Status: AC
  Filled 2019-04-26: qty 30

## 2019-04-26 MED ORDER — KETAMINE HCL 50 MG/ML IJ SOLN
INTRAMUSCULAR | Status: DC | PRN
  Administered 2019-04-26: 25 mg via INTRAVENOUS

## 2019-04-26 MED ORDER — CEFAZOLIN SODIUM-DEXTROSE 2-4 GM/100ML-% IV SOLN
INTRAVENOUS | Status: AC
  Filled 2019-04-26: qty 100

## 2019-04-26 MED ORDER — FENTANYL CITRATE (PF) 100 MCG/2ML IJ SOLN: 25.0000 ug | INTRAMUSCULAR | Status: DC | PRN

## 2019-04-26 MED ORDER — LIDOCAINE HCL (PF) 1 % IJ SOLN
INTRAMUSCULAR | Status: AC
  Filled 2019-04-26: qty 30

## 2019-04-26 MED ORDER — METOCLOPRAMIDE HCL 5 MG/ML IJ SOLN: 5.0000 mg | Freq: Three times a day (TID) | INTRAMUSCULAR | Status: DC | PRN

## 2019-04-26 MED ORDER — OXYCODONE-ACETAMINOPHEN 7.5-325 MG PO TABS
1.0000 | ORAL_TABLET | Freq: Once | ORAL | Status: AC
  Administered 2019-04-26: 1 via ORAL
  Filled 2019-04-26: qty 1

## 2019-04-26 MED ORDER — ONDANSETRON HCL 4 MG/2ML IJ SOLN: 4.0000 mg | Freq: Four times a day (QID) | INTRAMUSCULAR | Status: DC | PRN

## 2019-04-26 MED ORDER — OXYCODONE-ACETAMINOPHEN 7.5-325 MG PO TABS
ORAL_TABLET | ORAL | Status: AC
  Filled 2019-04-26: qty 1

## 2019-04-26 MED ORDER — METOCLOPRAMIDE HCL 10 MG PO TABS: 5.0000 mg | ORAL_TABLET | Freq: Three times a day (TID) | ORAL | Status: DC | PRN

## 2019-04-26 MED ORDER — BUPIVACAINE-EPINEPHRINE (PF) 0.5% -1:200000 IJ SOLN
INTRAMUSCULAR | Status: DC | PRN
  Administered 2019-04-26: 20 mL via PERINEURAL

## 2019-04-26 MED ORDER — FENTANYL CITRATE (PF) 100 MCG/2ML IJ SOLN
INTRAMUSCULAR | Status: AC
  Filled 2019-04-26: qty 2

## 2019-04-26 MED ORDER — GLYCOPYRROLATE 0.2 MG/ML IJ SOLN
INTRAMUSCULAR | Status: DC | PRN
  Administered 2019-04-26: 0.2 mg via INTRAVENOUS

## 2019-04-26 MED ORDER — LACTATED RINGERS IV SOLN
INTRAVENOUS | Status: DC
  Administered 2019-04-26: 11:00:00 via INTRAVENOUS

## 2019-04-26 MED ORDER — MIDAZOLAM HCL 2 MG/2ML IJ SOLN
INTRAMUSCULAR | Status: AC
  Filled 2019-04-26: qty 2

## 2019-04-26 MED ORDER — OXYCODONE-ACETAMINOPHEN 7.5-325 MG PO TABS: 1.0000 | ORAL_TABLET | ORAL | 0 refills | Status: DC | PRN

## 2019-04-26 MED ORDER — LIDOCAINE HCL 1 % IJ SOLN
INTRAMUSCULAR | Status: DC | PRN
  Administered 2019-04-26: 20 mL
  Administered 2019-04-26: 10 mL

## 2019-04-26 MED ORDER — PROPOFOL 10 MG/ML IV BOLUS
INTRAVENOUS | Status: DC | PRN
  Administered 2019-04-26: 20 mg via INTRAVENOUS

## 2019-04-26 MED ORDER — SODIUM CHLORIDE 0.9 % IV SOLN: INTRAVENOUS | Status: DC

## 2019-04-26 MED ORDER — PROPOFOL 10 MG/ML IV BOLUS
INTRAVENOUS | Status: AC
  Filled 2019-04-26: qty 20

## 2019-04-26 MED ORDER — GLYCOPYRROLATE 0.2 MG/ML IJ SOLN
INTRAMUSCULAR | Status: AC
  Filled 2019-04-26: qty 1

## 2019-04-26 MED ORDER — ONDANSETRON HCL 4 MG/2ML IJ SOLN: 4.0000 mg | Freq: Once | INTRAMUSCULAR | Status: DC | PRN

## 2019-04-26 MED ORDER — LIDOCAINE 2% (20 MG/ML) 5 ML SYRINGE
INTRAMUSCULAR | Status: DC | PRN
  Administered 2019-04-26: 50 mg via INTRAVENOUS

## 2019-04-26 MED ORDER — ONDANSETRON HCL 4 MG PO TABS: 4.0000 mg | ORAL_TABLET | Freq: Four times a day (QID) | ORAL | Status: DC | PRN

## 2019-04-26 MED ORDER — LIDOCAINE HCL (PF) 2 % IJ SOLN
INTRAMUSCULAR | Status: AC
  Filled 2019-04-26: qty 10

## 2019-04-26 MED ORDER — MIDAZOLAM HCL 5 MG/5ML IJ SOLN
INTRAMUSCULAR | Status: DC | PRN
  Administered 2019-04-26: 2 mg via INTRAVENOUS

## 2019-04-26 MED ORDER — FENTANYL CITRATE (PF) 100 MCG/2ML IJ SOLN
INTRAMUSCULAR | Status: DC | PRN
  Administered 2019-04-26 (×2): 25 ug via INTRAVENOUS
  Administered 2019-04-26: 50 ug via INTRAVENOUS

## 2019-04-26 SURGICAL SUPPLY — 20 items
CEMENT KYPHON CX01A KIT/MIXER (Cement) ×3 IMPLANT
COVER WAND RF STERILE (DRAPES) ×3 IMPLANT
DERMABOND ADVANCED (GAUZE/BANDAGES/DRESSINGS) ×2
DERMABOND ADVANCED .7 DNX12 (GAUZE/BANDAGES/DRESSINGS) ×1 IMPLANT
DEVICE BIOPSY BONE KYPHX (INSTRUMENTS) ×3 IMPLANT
DRAPE C-ARM XRAY 36X54 (DRAPES) ×3 IMPLANT
DURAPREP 26ML APPLICATOR (WOUND CARE) ×3 IMPLANT
FEE RENTAL RFA GENERATOR (MISCELLANEOUS) IMPLANT
GLOVE SURG SYN 9.0  PF PI (GLOVE) ×2
GLOVE SURG SYN 9.0 PF PI (GLOVE) ×1 IMPLANT
GOWN SRG 2XL LVL 4 RGLN SLV (GOWNS) ×1 IMPLANT
GOWN STRL NON-REIN 2XL LVL4 (GOWNS) ×2
GOWN STRL REUS W/ TWL LRG LVL3 (GOWN DISPOSABLE) ×1 IMPLANT
GOWN STRL REUS W/TWL LRG LVL3 (GOWN DISPOSABLE) ×4
PACK KYPHOPLASTY (MISCELLANEOUS) ×3 IMPLANT
RENTAL RFA  GENERATOR (MISCELLANEOUS)
RENTAL RFA GENERATOR (MISCELLANEOUS) IMPLANT
STRAP SAFETY 5IN WIDE (MISCELLANEOUS) ×3 IMPLANT
TRAY KYPHOPAK 15/3 EXPRESS 1ST (MISCELLANEOUS) ×3 IMPLANT
TRAY KYPHOPAK 20/3 EXPRESS 1ST (MISCELLANEOUS) ×1 IMPLANT

## 2019-04-26 NOTE — Transfer of Care (Signed)
Immediate Anesthesia Transfer of Care Note  Patient: Erin [name]  Procedure(s) Performed: KYPHOPLASTY T7 (N/A )  Patient Location: PACU  Anesthesia Type:General  Level of Consciousness: awake, alert  and oriented  Airway & Oxygen Therapy: Patient Spontanous Breathing and Patient connected to nasal cannula oxygen  Post-op Assessment: Report given to RN and Post -op Vital signs reviewed and stable  Post vital signs: Reviewed  Last Vitals:  Vitals Value Taken Time  BP 125/55 04/26/2019  1:43 PM  Temp    Pulse 95 04/26/2019  1:43 PM  Resp 20 04/26/2019  1:45 PM  SpO2 99 % 04/26/2019  1:43 PM  Vitals shown include unvalidated device data.  Last Pain:  Vitals:   04/26/19 1003  TempSrc: Oral  PainSc: 5          Complications: No apparent anesthesia complications

## 2019-04-26 NOTE — Anesthesia Postprocedure Evaluation (Signed)
Anesthesia Post Note  Patient: Erin [name]  Procedure(s) Performed: KYPHOPLASTY T7 (N/A Back)  Patient location during evaluation: PACU Anesthesia Type: General Level of consciousness: awake and alert and oriented Pain management: pain level controlled Vital Signs Assessment: post-procedure vital signs reviewed and stable Respiratory status: spontaneous breathing Cardiovascular status: blood pressure returned to baseline Anesthetic complications: no     Last Vitals:  Vitals:   04/26/19 1415 04/26/19 1426  BP: (!) 137/57 132/76  Pulse: 80 79  Resp: (!) 26 18  Temp: 36.6 C 36.9 C  SpO2: 100% 99%    Last Pain:  Vitals:   04/26/19 1426  TempSrc: Temporal  PainSc: 0-No pain                 Synda Bagent

## 2019-04-26 NOTE — H&P (Signed)
Reviewed paper H+P, will be scanned into chart. No changes noted.  

## 2019-04-26 NOTE — Discharge Instructions (Addendum)
Take it easy today and tomorrow.  Remove Band-Aids and resume more normal activities on Thursday.  Call office if you are having problems.   AMBULATORY SURGERY  DISCHARGE INSTRUCTIONS   1) The drugs that you were given will stay in your system until tomorrow so for the next 24 hours you should not:  A) Drive an automobile B) Make any legal decisions C) Drink any alcoholic beverage   2) You may resume regular meals tomorrow.  Today it is better to start with liquids and gradually work up to solid foods.  You may eat anything you prefer, but it is better to start with liquids, then soup and crackers, and gradually work up to solid foods.   3) Please notify your doctor immediately if you have any unusual bleeding, trouble breathing, redness and pain at the surgery site, drainage, fever, or pain not relieved by medication.    4) Additional Instructions:        Please contact your physician with any problems or Same Day Surgery at [phone], Monday through Friday 6 am to 4 pm, or [place] at [facility] number at [phone].

## 2019-04-26 NOTE — Op Note (Signed)
Date Apr 26, 2019  time 1:39 PM   PATIENT:  Erin [name]   PRE-OPERATIVE DIAGNOSIS:  closed wedge compression fracture of T7 vertebra plana   POST-OPERATIVE DIAGNOSIS:  closed wedge compression fracture of T7 vertebra plana   PROCEDURE:  Procedure(s): KYPHOPLASTY T7  SURGEON: Laurene Footman, MD   ASSISTANTS: None   ANESTHESIA:   local and MAC   EBL:  No intake/output data recorded.   BLOOD ADMINISTERED:none   DRAINS: none    LOCAL MEDICATIONS USED:  MARCAINE    and XYLOCAINE    SPECIMEN:   T7 vertebral body   DISPOSITION OF SPECIMEN:  Pathology   COUNTS:  YES   TOURNIQUET:  * No tourniquets in log *   IMPLANTS: Bone cement   DICTATION: .Dragon Dictation  patient was brought to the operating room and after adequate anesthesia was obtained the patient was placed prone.  C arm was brought in in good visualization of the affected level obtained on both AP and lateral projections.  After patient identification and timeout procedures were completed, local anesthetic was infiltrated with 10 cc 1% Xylocaine infiltrated subcutaneously.  This is done the area on the both sides of the planned approach.  The back was then prepped and draped in the usual sterile manner and repeat timeout procedure carried out.  A spinal needle was brought down to the pedicle on the both sides of  T7 and a 50-50 mix of 1% Xylocaine half percent Sensorcaine with epinephrine total of 20 cc injected.  After allowing this to set a small incision was made and the trocar was advanced into the vertebral body in an extrapedicular fashion.  Biopsy was obtained Drilling was carried out balloon inserted with inflation to  2 cc.    Identical procedure carried on the left without the biopsy and inflation to about a cc and a half.  When the cement was appropriate consistency 2 cc on the right and letting this set for about 30 seconds was then injected into the left side with additional 2 cc were injected into the vertebral  body with small amount of extravasation into the T7-T8 disc space n, good fill superior to inferior endplates and from right to left sides along the inferior endplate.  After the cement had set the trochar was removed and permanent C-arm views obtained.  The wound was closed with Dermabond followed by Band-Aid   PLAN OF CARE: Discharge to home after PACU   PATIENT DISPOSITION:  PACU - hemodynamically stable.

## 2019-04-26 NOTE — Anesthesia Preprocedure Evaluation (Signed)
Anesthesia Evaluation  Patient identified by MRN, date of birth, ID band Patient awake    Reviewed: Allergy & Precautions, H&P , NPO status , Patient's Chart, lab work & pertinent test results, reviewed documented beta blocker date and time   Airway Mallampati: II   Neck ROM: full    Dental  (+) Poor Dentition   Pulmonary shortness of breath, COPD,    Pulmonary exam normal        Cardiovascular Exercise Tolerance: Poor hypertension, + angina + [name], + Peripheral Vascular Disease and +CHF  Normal cardiovascular exam+ Valvular Problems/Murmurs  Rhythm:regular Rate:Normal     Neuro/Psych negative neurological ROS  negative psych ROS   GI/Hepatic negative GI ROS, Neg liver ROS, PUD, GERD  ,  Endo/Other  negative endocrine ROS  Renal/GU negative Renal ROS  negative genitourinary   Musculoskeletal  (+) Arthritis , Osteoarthritis,    Abdominal   Peds  Hematology negative hematology ROS (+) anemia ,   Anesthesia Other Findings Past Medical History: No date: Anemia     Comment:  h/o No date: Cancer ([place])     Comment:  melanoma x 2 the last one was of her leg and surgeon               removed it No date: Colon polyp No date: Coronary artery calcification No date: Coronary artery disease No date: DDD (degenerative disc disease), lumbar 01/20/2017: Diverticulosis No date: Dysphagia No date: Dyspnea     Comment:  with exertion-unable to walk a mile without getting sob-              dr sparks set pt up to see Dr Rockey Situ after 11-19-16               surgery No date: Environmental allergies No date: Esophageal dysmotility No date: Fibrocystic breast disease No date: GERD (gastroesophageal reflux disease) No date: Heart murmur     Comment:  h/o  No date: Hyperlipidemia     Comment:  unspecified No date: Hypertension No date: Mitral regurgitation 01/20/2017: Monilial esophagitis (HCC) No date: Obstipation No date:  Osteopenia No date: Peptic ulcer disease No date: Pulmonary fibrosis (HCC)     Comment:  per Dr Raul Del No date: RA (rheumatoid arthritis) ([place]) No date: Raynaud's disease 01/20/2017: Redundant colon No date: Scleroderma ([place]) No date: Telangiectasia of colon 01/20/2017: Tubular adenoma of colon     Comment:  unspecified No date: Vulvar dysplasia Past Surgical History: No date: ABDOMINAL HYSTERECTOMY 20+ years ago: BREAST CYST EXCISION; Left     Comment:  No scar visible No date: BREAST LUMPECTOMY; Right No date: CARPAL TUNNEL RELEASE; Bilateral No date: COLON SURGERY     Comment:  colon polyp  09/30/2011: COLONOSCOPY     Comment:  tubular adenoma rtm No date: COLONOSCOPY     Comment:  05/02/2003, 04/10/2000 01/20/2017: COLONOSCOPY WITH PROPOFOL; N/A     Comment:  Procedure: COLONOSCOPY WITH PROPOFOL;  Surgeon: Lollie Sails, MD;  Location: [facility] ENDOSCOPY;  Service:               Endoscopy;  Laterality: N/A; No date: ESOPHAGOGASTRODUODENOSCOPY No date: ESOPHAGOGASTRODUODENOSCOPY     Comment:  09/30/2011, 04/10/2000 , no repeat rtm 01/20/2017: ESOPHAGOGASTRODUODENOSCOPY (EGD) WITH PROPOFOL; N/A     Comment:  Procedure: ESOPHAGOGASTRODUODENOSCOPY (EGD) WITH               PROPOFOL;  Surgeon: Billie Ruddy  Gustavo Lah, MD;  Location:               [facility] ENDOSCOPY;  Service: Endoscopy;  Laterality: N/A; 11/19/2016: EXCISION HYDRADENITIS LABIA; Left     Comment:  Procedure: EXCISION LABIAL MASS;  Surgeon: Robert Bellow, MD;  Location: [facility] ORS;  Service: General;                Laterality: Left; No date: EYE SURGERY; Bilateral     Comment:  cataracts No date: HEEL SPUR EXCISION 02/23/2018: HIP ARTHROPLASTY; Left     Comment:  Procedure: ARTHROPLASTY BIPOLAR HIP (HEMIARTHROPLASTY);               Surgeon: Thornton Park, MD;  Location: [facility] ORS;                Service: Orthopedics;  Laterality: Left; 11/19/2016: MASS EXCISION; Left     Comment:  Procedure:  EXCISION LEFT THIGH MELANOMA;  Surgeon:               Robert Bellow, MD;  Location: [facility] ORS;  Service:               General;  Laterality: Left; 11/19/2016: SENTINEL NODE BIOPSY; Left     Comment:  Procedure: INGUINAL SENTINEL NODE BIOPSY;  Surgeon:               Robert Bellow, MD;  Location: [facility] ORS;  Service:               General;  Laterality: Left; 01/29/2017: UPPER ESOPHAGEAL ENDOSCOPIC ULTRASOUND (EUS); N/A     Comment:  Procedure: UPPER ESOPHAGEAL ENDOSCOPIC ULTRASOUND (EUS);              Surgeon: Holly Bodily, MD;  Location: [facility]               ENDOSCOPY;  Service: Endoscopy;  Laterality: N/A; BMI    Body Mass Index:  40.42 kg/m     Reproductive/Obstetrics negative OB ROS                             Anesthesia Physical  Anesthesia Plan  ASA: IV and emergent  Anesthesia Plan: MAC and General   Post-op Pain Management:    Induction:   PONV Risk Score and Plan:   Airway Management Planned: Nasal Cannula  Additional Equipment:   Intra-op Plan:   Post-operative Plan:   Informed Consent: I have reviewed the patients History and Physical, chart, labs and discussed the procedure including the risks, benefits and alternatives for the proposed anesthesia with the patient or authorized representative who has indicated his/her understanding and acceptance.     Dental Advisory Given  Plan Discussed with: CRNA  Anesthesia Plan Comments:         Anesthesia Quick Evaluation

## 2019-04-26 NOTE — Anesthesia Post-op Follow-up Note (Signed)
Anesthesia QCDR form completed.        

## 2019-04-28 LAB — SURGICAL PATHOLOGY

## 2019-05-03 ENCOUNTER — Ambulatory Visit: Payer: Medicare Other

## 2019-05-09 ENCOUNTER — Telehealth: Payer: Self-pay | Admitting: Pulmonary Disease

## 2019-05-09 NOTE — Telephone Encounter (Signed)
DS please advise. Thanks.  

## 2019-05-09 NOTE — Telephone Encounter (Signed)
Pt stated that she is taking OFEV and she feels she is having side affects to it. Having gas, back pain and feels like something is moving under her breast and underarm. Wants to stop taking medication to see if it makes a difference but wants to get permission first.

## 2019-05-10 ENCOUNTER — Other Ambulatory Visit: Payer: Self-pay | Admitting: Orthopedic Surgery

## 2019-05-10 DIAGNOSIS — S22000A Wedge compression fracture of unspecified thoracic vertebra, initial encounter for closed fracture: Secondary | ICD-10-CM

## 2019-05-10 NOTE — Telephone Encounter (Signed)
Pt is aware of below recommendations and voiced her understanding. Nothing further is needed.  

## 2019-05-10 NOTE — Telephone Encounter (Signed)
It will be OK to stop for a week and see if symptoms improve, then resume. If symptoms recur, she may remain off of the med until follow up and we will discuss at that time  Thanks  Waunita Schooner

## 2019-05-11 ENCOUNTER — Ambulatory Visit: Payer: Medicare Other

## 2019-05-11 ENCOUNTER — Ambulatory Visit: Payer: Medicare Other | Admitting: Pulmonary Disease

## 2019-05-16 ENCOUNTER — Other Ambulatory Visit: Payer: Self-pay

## 2019-05-16 ENCOUNTER — Ambulatory Visit
Admission: RE | Admit: 2019-05-16 | Discharge: 2019-05-16 | Disposition: A | Discharge status: Home / Self Care | Payer: Medicare Other | Source: Ambulatory Visit | Attending: Orthopedic Surgery | Admitting: Orthopedic Surgery

## 2019-05-16 DIAGNOSIS — S22000A Wedge compression fracture of unspecified thoracic vertebra, initial encounter for closed fracture: Secondary | ICD-10-CM | POA: Insufficient documentation

## 2019-05-17 ENCOUNTER — Encounter
Admission: RE | Disposition: A | Discharge status: Home / Self Care | Payer: Self-pay | Source: Home / Self Care | Attending: Orthopedic Surgery

## 2019-05-17 ENCOUNTER — Encounter: Payer: Self-pay | Admitting: *Deleted

## 2019-05-17 ENCOUNTER — Ambulatory Visit
Admission: RE | Admit: 2019-05-17 | Discharge: 2019-05-17 | Disposition: A | Discharge status: Home / Self Care | Payer: Medicare Other | Attending: Orthopedic Surgery | Admitting: Orthopedic Surgery

## 2019-05-17 ENCOUNTER — Other Ambulatory Visit
Admission: RE | Admit: 2019-05-17 | Discharge: 2019-05-17 | Disposition: A | Discharge status: Home / Self Care | Payer: Medicare Other | Source: Ambulatory Visit | Attending: Orthopedic Surgery | Admitting: Orthopedic Surgery

## 2019-05-17 ENCOUNTER — Ambulatory Visit: Payer: Medicare Other

## 2019-05-17 ENCOUNTER — Ambulatory Visit: Payer: Medicare Other | Admitting: Certified Registered Nurse Anesthetist

## 2019-05-17 ENCOUNTER — Other Ambulatory Visit: Payer: Self-pay

## 2019-05-17 DIAGNOSIS — Z7952 Long term (current) use of systemic steroids: Secondary | ICD-10-CM | POA: Diagnosis not present

## 2019-05-17 DIAGNOSIS — Z79899 Other long term (current) drug therapy: Secondary | ICD-10-CM | POA: Diagnosis not present

## 2019-05-17 DIAGNOSIS — E785 Hyperlipidemia, unspecified: Secondary | ICD-10-CM | POA: Insufficient documentation

## 2019-05-17 DIAGNOSIS — I509 Heart failure, unspecified: Secondary | ICD-10-CM | POA: Diagnosis not present

## 2019-05-17 DIAGNOSIS — K219 Gastro-esophageal reflux disease without esophagitis: Secondary | ICD-10-CM | POA: Insufficient documentation

## 2019-05-17 DIAGNOSIS — Z7982 Long term (current) use of aspirin: Secondary | ICD-10-CM | POA: Insufficient documentation

## 2019-05-17 DIAGNOSIS — I110 Hypertensive heart disease with heart failure: Secondary | ICD-10-CM | POA: Diagnosis not present

## 2019-05-17 DIAGNOSIS — I2510 Atherosclerotic heart disease of native coronary artery without angina pectoris: Secondary | ICD-10-CM | POA: Diagnosis not present

## 2019-05-17 DIAGNOSIS — Z1159 Encounter for screening for other viral diseases: Secondary | ICD-10-CM | POA: Diagnosis not present

## 2019-05-17 DIAGNOSIS — Z419 Encounter for procedure for purposes other than remedying health state, unspecified: Secondary | ICD-10-CM

## 2019-05-17 DIAGNOSIS — M549 Dorsalgia, unspecified: Secondary | ICD-10-CM | POA: Diagnosis present

## 2019-05-17 DIAGNOSIS — Z85820 Personal history of malignant melanoma of skin: Secondary | ICD-10-CM | POA: Diagnosis not present

## 2019-05-17 DIAGNOSIS — M4854XA Collapsed vertebra, not elsewhere classified, thoracic region, initial encounter for fracture: Secondary | ICD-10-CM | POA: Insufficient documentation

## 2019-05-17 HISTORY — PX: KYPHOPLASTY: SHX5884

## 2019-05-17 LAB — SARS CORONAVIRUS 2 BY RT PCR (HOSPITAL ORDER, PERFORMED IN ~~LOC~~ HOSPITAL LAB): SARS Coronavirus 2: NEGATIVE

## 2019-05-17 SURGERY — KYPHOPLASTY
Anesthesia: General

## 2019-05-17 MED ORDER — FENTANYL CITRATE (PF) 100 MCG/2ML IJ SOLN
INTRAMUSCULAR | Status: AC
  Filled 2019-05-17: qty 2

## 2019-05-17 MED ORDER — BUPIVACAINE-EPINEPHRINE (PF) 0.5% -1:200000 IJ SOLN
INTRAMUSCULAR | Status: DC | PRN
  Administered 2019-05-17: 10 mL

## 2019-05-17 MED ORDER — PROPOFOL 500 MG/50ML IV EMUL
INTRAVENOUS | Status: DC | PRN
  Administered 2019-05-17: 75 ug/kg/min via INTRAVENOUS

## 2019-05-17 MED ORDER — PROPOFOL 10 MG/ML IV BOLUS
INTRAVENOUS | Status: DC | PRN
  Administered 2019-05-17: 40 mg via INTRAVENOUS
  Administered 2019-05-17: 20 mg via INTRAVENOUS
  Administered 2019-05-17: 30 mg via INTRAVENOUS

## 2019-05-17 MED ORDER — LIDOCAINE HCL (PF) 1 % IJ SOLN
INTRAMUSCULAR | Status: AC
  Filled 2019-05-17: qty 30

## 2019-05-17 MED ORDER — BUPIVACAINE-EPINEPHRINE (PF) 0.5% -1:200000 IJ SOLN
INTRAMUSCULAR | Status: AC
  Filled 2019-05-17: qty 30

## 2019-05-17 MED ORDER — CEFAZOLIN SODIUM-DEXTROSE 2-4 GM/100ML-% IV SOLN
INTRAVENOUS | Status: AC
  Filled 2019-05-17: qty 100

## 2019-05-17 MED ORDER — FENTANYL CITRATE (PF) 100 MCG/2ML IJ SOLN
25.0000 ug | INTRAMUSCULAR | Status: DC | PRN
  Administered 2019-05-17 (×2): 50 ug via INTRAVENOUS

## 2019-05-17 MED ORDER — CEFAZOLIN SODIUM-DEXTROSE 2-4 GM/100ML-% IV SOLN
2.0000 g | Freq: Once | INTRAVENOUS | Status: AC
  Administered 2019-05-17: 2 g via INTRAVENOUS

## 2019-05-17 MED ORDER — ONDANSETRON HCL 4 MG/2ML IJ SOLN: 4.0000 mg | Freq: Once | INTRAMUSCULAR | Status: DC | PRN

## 2019-05-17 MED ORDER — ONDANSETRON HCL 4 MG/2ML IJ SOLN
INTRAMUSCULAR | Status: DC | PRN
  Administered 2019-05-17: 4 mg via INTRAVENOUS

## 2019-05-17 MED ORDER — LACTATED RINGERS IV SOLN
INTRAVENOUS | Status: DC
  Administered 2019-05-17 (×2): via INTRAVENOUS

## 2019-05-17 MED ORDER — FENTANYL CITRATE (PF) 100 MCG/2ML IJ SOLN
INTRAMUSCULAR | Status: DC | PRN
  Administered 2019-05-17 (×2): 50 ug via INTRAVENOUS

## 2019-05-17 MED ORDER — BUPIVACAINE HCL (PF) 0.5 % IJ SOLN
INTRAMUSCULAR | Status: AC
  Filled 2019-05-17: qty 30

## 2019-05-17 MED ORDER — LIDOCAINE HCL (CARDIAC) PF 100 MG/5ML IV SOSY
PREFILLED_SYRINGE | INTRAVENOUS | Status: DC | PRN
  Administered 2019-05-17: 80 mg via INTRAVENOUS

## 2019-05-17 MED ORDER — MIDAZOLAM HCL 2 MG/2ML IJ SOLN
INTRAMUSCULAR | Status: DC | PRN
  Administered 2019-05-17: 2 mg via INTRAVENOUS

## 2019-05-17 MED ORDER — MIDAZOLAM HCL 2 MG/2ML IJ SOLN
INTRAMUSCULAR | Status: AC
  Filled 2019-05-17: qty 2

## 2019-05-17 MED ORDER — LIDOCAINE HCL 1 % IJ SOLN
INTRAMUSCULAR | Status: DC | PRN
  Administered 2019-05-17: 20 mL

## 2019-05-17 MED ORDER — IOPAMIDOL (ISOVUE-M 200) INJECTION 41%
INTRAMUSCULAR | Status: DC | PRN
  Administered 2019-05-17: 14:00:00 40 mL

## 2019-05-17 SURGICAL SUPPLY — 22 items
CEMENT KYPHON CX01A KIT/MIXER (Cement) ×3 IMPLANT
COVER WAND RF STERILE (DRAPES) ×3 IMPLANT
DERMABOND ADVANCED (GAUZE/BANDAGES/DRESSINGS) ×2
DERMABOND ADVANCED .7 DNX12 (GAUZE/BANDAGES/DRESSINGS) ×1 IMPLANT
DEVICE BIOPSY BONE KYPH (INSTRUMENTS) ×2 IMPLANT
DEVICE BIOPSY BONE KYPHX (INSTRUMENTS) ×3 IMPLANT
DRAPE C-ARM XRAY 36X54 (DRAPES) ×3 IMPLANT
DURAPREP 26ML APPLICATOR (WOUND CARE) ×3 IMPLANT
FEE RENTAL RFA GENERATOR (MISCELLANEOUS) IMPLANT
GLOVE SURG SYN 9.0  PF PI (GLOVE) ×2
GLOVE SURG SYN 9.0 PF PI (GLOVE) ×1 IMPLANT
GOWN SRG 2XL LVL 4 RGLN SLV (GOWNS) ×1 IMPLANT
GOWN STRL NON-REIN 2XL LVL4 (GOWNS) ×2
GOWN STRL REUS W/ TWL LRG LVL3 (GOWN DISPOSABLE) ×1 IMPLANT
GOWN STRL REUS W/TWL LRG LVL3 (GOWN DISPOSABLE) ×2
PACK KYPHOPLASTY (MISCELLANEOUS) ×3 IMPLANT
RENTAL RFA  GENERATOR (MISCELLANEOUS)
RENTAL RFA GENERATOR (MISCELLANEOUS) IMPLANT
STRAP SAFETY 5IN WIDE (MISCELLANEOUS) ×3 IMPLANT
TRAY KYPHOPAK 15/2 EXPRESS (KITS) ×2 IMPLANT
TRAY KYPHOPAK 15/3 EXPRESS 1ST (MISCELLANEOUS) ×1 IMPLANT
TRAY KYPHOPAK 20/3 EXPRESS 1ST (MISCELLANEOUS) ×1 IMPLANT

## 2019-05-17 NOTE — Op Note (Signed)
Date 05/17/2019  time 2:02 PM   PATIENT:  Erin [name]   PRE-OPERATIVE DIAGNOSIS:  closed wedge compression fracture of T8   POST-OPERATIVE DIAGNOSIS:  closed wedge compression fracture of T8 T8   PROCEDURE:  Procedure(s): KYPHOPLASTY T8  SURGEON: Laurene Footman, MD   ASSISTANTS: None   ANESTHESIA:   local and MAC   EBL:  No intake/output data recorded.   BLOOD ADMINISTERED:none   DRAINS: none    LOCAL MEDICATIONS USED:  MARCAINE    and XYLOCAINE    SPECIMEN:   T8 vertebral body biopsy   DISPOSITION OF SPECIMEN:  Pathology   COUNTS:  YES   TOURNIQUET:  * No tourniquets in log *   IMPLANTS: Bone cement   DICTATION: .Dragon Dictation  patient was brought to the operating room and after adequate anesthesia was obtained the patient was placed prone.  C arm was brought in in good visualization of the affected level obtained on both AP and lateral projections.  After patient identification and timeout procedures were completed, local anesthetic was infiltrated with 10 cc 1% Xylocaine infiltrated subcutaneously.  This is done the area on the right side of the planned approach.  The back was then prepped and draped in the usual sterile manner and repeat timeout procedure carried out.  A spinal needle was brought down to the pedicle on the right side of  T8 and a 50-50 mix of 1% Xylocaine half percent Sensorcaine with epinephrine total of 20 cc injected.  After allowing this to set a small incision was made and the trocar was advanced into the vertebral body in an extrapedicular fashion.  Biopsy was obtained Drilling was carried out balloon inserted with inflation to  2-1/2 cc.  When the cement was appropriate consistency 3-1/2 cc were injected into the vertebral body without extravasation, good fill superior to inferior endplates and from right to left sides along the inferior endplate.  After the cement had set the trochar was removed and permanent C-arm views obtained.  The wound was  closed with Dermabond followed by Band-Aid   PLAN OF CARE: Discharge to home after PACU   PATIENT DISPOSITION:  PACU - hemodynamically stable.

## 2019-05-17 NOTE — Transfer of Care (Signed)
Immediate Anesthesia Transfer of Care Note  Patient: Erin [name]  Procedure(s) Performed: KYPHOPLASTY T8 (N/A )  Patient Location: PACU  Anesthesia Type:General  Level of Consciousness: awake, oriented, drowsy and patient cooperative  Airway & Oxygen Therapy: Patient Spontanous Breathing  Post-op Assessment: Report given to RN, Post -op Vital signs reviewed and stable and Patient moving all extremities  Post vital signs: Reviewed and stable  Last Vitals:  Vitals Value Taken Time  BP 105/55 05/17/2019  2:06 PM  Temp 36.4 C 05/17/2019  2:06 PM  Pulse 94 05/17/2019  2:07 PM  Resp 20 05/17/2019  2:07 PM  SpO2 94 % 05/17/2019  2:07 PM  Vitals shown include unvalidated device data.  Last Pain:  Vitals:   05/17/19 0938  TempSrc: Tympanic  PainSc: 7          Complications: No apparent anesthesia complications

## 2019-05-17 NOTE — Anesthesia Preprocedure Evaluation (Signed)
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 Wall thickness was   increased in a pattern of mild LVH. Systolic function was normal.   The estimated ejection fraction was in the range of 60% to 65%.   Left ventricular diastolic function parameters were normal. - Ascending aorta: The ascending aorta was mildly dilated. - Aortic arch: The aortic arch was mildly dilated. - Mitral valve: Calcified annulus. - Left atrium: The atrium was mildly dilated. - Right ventricle: The cavity size was normal. Wall thickness was   normal. Systolic function was normal. - Pulmonary arteries: Systolic pressure was mildly increased, in   the range of 35 mm Hg to 40 mm Hg.   Neuro/Psych neg Seizures negative neurological ROS  negative psych ROS   GI/Hepatic Neg liver ROS, PUD, GERD  ,  Endo/Other  negative endocrine ROSneg diabetes  Renal/GU negative Renal ROS     Musculoskeletal  (+) Arthritis , Rheumatoid disorders,    Abdominal (+) + obese,   Peds  Hematology  (+) anemia ,   Anesthesia Other Findings Past Medical History: No date: Anemia     Comment:  h/o No date: Cancer ([place])     Comment:  melanoma x 3the last one was of her leg and surgeon   removed it No date: Colon polyp No date: Complication of anesthesia     Comment:  PT STATED THAT WITH JOINT SURGERY THAT SHE WAS GIVEN               SOME TYPE OF ANESTHESIA THAT MADE HER HALLUCINATE  No date: Coronary artery calcification No date: Coronary artery disease No date: DDD (degenerative disc disease), lumbar 01/20/2017: Diverticulosis No date: Dysphagia No date: Dyspnea     Comment:  with exertion-unable to walk a mile without getting sob-              dr sparks set pt up to see Dr Rockey Situ after 11-19-16               surgery No date: Environmental allergies No date: Esophageal dysmotility No date: Fibrocystic breast disease No date: GERD (gastroesophageal reflux disease) No date: Heart murmur     Comment:  asymptomatic No date: Hyperlipidemia     Comment:  unspecified No date: Hypertension No date: Mitral regurgitation 01/20/2017: Monilial esophagitis (HCC) No date: Obstipation No date: Osteopenia No date: Peptic ulcer disease No date: Pulmonary fibrosis ([place])     Comment:  per Dr Raul Del No date: RA (rheumatoid arthritis) ([employer]) No date: Raynaud's disease 01/20/2017: Redundant colon No date: Scleroderma ([place]) No date: Telangiectasia of colon 01/20/2017: Tubular adenoma of colon     Comment:  unspecified No date: Vulvar dysplasia   Reproductive/Obstetrics  Anesthesia Physical Anesthesia Plan  ASA: III  Anesthesia Plan: General   Post-op Pain Management:    Induction: Intravenous  PONV Risk Score and Plan: 2 and Propofol infusion  Airway Management Planned: Natural Airway  Additional Equipment:   Intra-op Plan:   Post-operative Plan:   Informed Consent: I have reviewed the patients History and Physical, chart, labs and discussed the procedure including the risks, benefits and alternatives for the proposed anesthesia with the patient or authorized representative who has indicated his/her  understanding and acceptance.     Dental advisory given  Plan Discussed with: CRNA and Anesthesiologist  Anesthesia Plan Comments: (Pt reports remembering and hearing more than she wanted to during last kyphoplasty, discussed using slightly higher dose of anesthetic for this procedure, pt indicates high tolerance to medications, has chronic benzodiazepine use)        Anesthesia Quick Evaluation

## 2019-05-17 NOTE — Anesthesia Postprocedure Evaluation (Signed)
Anesthesia Post Note  Patient: Erin [name]  Procedure(s) Performed: KYPHOPLASTY T8 (N/A )  Patient location during evaluation: PACU Anesthesia Type: General Level of consciousness: awake and alert and oriented Pain management: pain level controlled Vital Signs Assessment: post-procedure vital signs reviewed and stable Respiratory status: spontaneous breathing, nonlabored ventilation and respiratory function stable Cardiovascular status: blood pressure returned to baseline and stable Postop Assessment: no signs of nausea or vomiting Anesthetic complications: no     Last Vitals:  Vitals:   05/17/19 1443 05/17/19 1506  BP: (!) 133/59 133/69  Pulse: 87   Resp: (!) 22 20  Temp: 36.8 C   SpO2: 97% 100%    Last Pain:  Vitals:   05/17/19 1506  TempSrc:   PainSc: 0-No pain                 Elyce Zollinger

## 2019-05-17 NOTE — H&P (Signed)
Reviewed paper H+P, will be scanned into chart. No changes noted.  

## 2019-05-17 NOTE — Discharge Instructions (Addendum)
Take it easy today and tomorrow then resume more normal activities avoid lifting over 5 pounds for 2 weeks.  Call office if you need a refill of pain medicine.  AMBULATORY SURGERY  DISCHARGE INSTRUCTIONS   1) The drugs that you were given will stay in your system until tomorrow so for the next 24 hours you should not:  A) Drive an automobile B) Make any legal decisions C) Drink any alcoholic beverage   2) You may resume regular meals tomorrow.  Today it is better to start with liquids and gradually work up to solid foods.  You may eat anything you prefer, but it is better to start with liquids, then soup and crackers, and gradually work up to solid foods.   3) Please notify your doctor immediately if you have any unusual bleeding, trouble breathing, redness and pain at the surgery site, drainage, fever, or pain not relieved by medication.    4) Additional Instructions:        Please contact your physician with any problems or Same Day Surgery at [phone], Monday through Friday 6 am to 4 pm, or [place] at [facility] number at [phone].

## 2019-05-17 NOTE — Anesthesia Post-op Follow-up Note (Signed)
Anesthesia QCDR form completed.        

## 2019-05-17 NOTE — Progress Notes (Signed)
Repositioned patient, diet drink given per patient request.

## 2019-05-18 ENCOUNTER — Ambulatory Visit: Payer: Medicare Other

## 2019-05-18 ENCOUNTER — Encounter: Payer: Self-pay | Admitting: Orthopedic Surgery

## 2019-05-19 LAB — SURGICAL PATHOLOGY

## 2019-05-26 ENCOUNTER — Ambulatory Visit: Payer: Medicare Other | Admitting: Pulmonary Disease

## 2019-05-27 ENCOUNTER — Other Ambulatory Visit: Payer: Self-pay

## 2019-05-27 ENCOUNTER — Emergency Department: Payer: Medicare Other

## 2019-05-27 ENCOUNTER — Encounter: Payer: Self-pay | Admitting: Emergency Medicine

## 2019-05-27 DIAGNOSIS — J8410 Pulmonary fibrosis, unspecified: Secondary | ICD-10-CM | POA: Insufficient documentation

## 2019-05-27 DIAGNOSIS — I10 Essential (primary) hypertension: Secondary | ICD-10-CM | POA: Insufficient documentation

## 2019-05-27 DIAGNOSIS — U071 COVID-19: Secondary | ICD-10-CM | POA: Diagnosis not present

## 2019-05-27 DIAGNOSIS — Z79899 Other long term (current) drug therapy: Secondary | ICD-10-CM | POA: Diagnosis not present

## 2019-05-27 DIAGNOSIS — R0600 Dyspnea, unspecified: Secondary | ICD-10-CM | POA: Diagnosis present

## 2019-05-27 DIAGNOSIS — Z85820 Personal history of malignant melanoma of skin: Secondary | ICD-10-CM | POA: Insufficient documentation

## 2019-05-27 DIAGNOSIS — Z7982 Long term (current) use of aspirin: Secondary | ICD-10-CM | POA: Diagnosis not present

## 2019-05-27 LAB — CBC WITH DIFFERENTIAL/PLATELET
Abs Immature Granulocytes: 0.09 10*3/uL — ABNORMAL HIGH (ref 0.00–0.07)
Basophils Absolute: 0 10*3/uL (ref 0.0–0.1)
Basophils Relative: 0 %
Eosinophils Absolute: 0 10*3/uL (ref 0.0–0.5)
Eosinophils Relative: 0 %
HCT: 34.6 % — ABNORMAL LOW (ref 36.0–46.0)
Hemoglobin: 10.9 g/dL — ABNORMAL LOW (ref 12.0–15.0)
Immature Granulocytes: 2 %
Lymphocytes Relative: 16 %
Lymphs Abs: 0.8 10*3/uL (ref 0.7–4.0)
MCH: 28.4 pg (ref 26.0–34.0)
MCHC: 31.5 g/dL (ref 30.0–36.0)
MCV: 90.1 fL (ref 80.0–100.0)
Monocytes Absolute: 0.8 10*3/uL (ref 0.1–1.0)
Monocytes Relative: 15 %
Neutro Abs: 3.6 10*3/uL (ref 1.7–7.7)
Neutrophils Relative %: 67 %
Platelets: 339 10*3/uL (ref 150–400)
RBC: 3.84 MIL/uL — ABNORMAL LOW (ref 3.87–5.11)
RDW: 15.6 % — ABNORMAL HIGH (ref 11.5–15.5)
WBC: 5.4 10*3/uL (ref 4.0–10.5)
nRBC: 0.4 % — ABNORMAL HIGH (ref 0.0–0.2)

## 2019-05-27 LAB — BASIC METABOLIC PANEL
Anion gap: 13 (ref 5–15)
BUN: 13 mg/dL (ref 8–23)
CO2: 25 mmol/L (ref 22–32)
Calcium: 7.7 mg/dL — ABNORMAL LOW (ref 8.9–10.3)
Chloride: 98 mmol/L (ref 98–111)
Creatinine, Ser: 1.11 mg/dL — ABNORMAL HIGH (ref 0.44–1.00)
GFR calc Af Amer: 57 mL/min — ABNORMAL LOW (ref >60)
GFR calc non Af Amer: 49 mL/min — ABNORMAL LOW (ref >60)
Glucose, Bld: 103 mg/dL — ABNORMAL HIGH (ref 70–99)
Potassium: 3.8 mmol/L (ref 3.5–5.1)
Sodium: 136 mmol/L (ref 135–145)

## 2019-05-27 LAB — TROPONIN I: Troponin I: <0.03 ng/mL (ref <0.03)

## 2019-05-27 NOTE — ED Notes (Signed)
Update given to patient's son.

## 2019-05-27 NOTE — ED Triage Notes (Signed)
Pt arrives via [facility] with c/o fall on Monday. Pt denies LOC at this time or use of anticoagulants. Pt states that she is unsteady on her feet and had back surgery x 2 weeks ago. Pt states that she lives alone and "I just can't do it anymore". Pt is in NAD.

## 2019-05-27 NOTE — ED Notes (Signed)
Patient transported to X-ray 

## 2019-05-27 NOTE — ED Notes (Signed)
Son given an update on plan of care.

## 2019-05-27 NOTE — ED Notes (Signed)
Patient given a warm blanket at this time.  

## 2019-05-27 NOTE — ED Notes (Signed)
Patient given update on wait time. Patient verbalizes understanding.  

## 2019-05-28 ENCOUNTER — Emergency Department: Payer: Medicare Other

## 2019-05-28 ENCOUNTER — Emergency Department
Admission: EM | Admit: 2019-05-28 | Discharge: 2019-05-28 | Disposition: A | Discharge status: Other Acute Inpatient Hospital | Payer: Medicare Other | Attending: Emergency Medicine | Admitting: Emergency Medicine

## 2019-05-28 ENCOUNTER — Encounter (HOSPITAL_COMMUNITY): Payer: Self-pay

## 2019-05-28 ENCOUNTER — Inpatient Hospital Stay (HOSPITAL_COMMUNITY)
Admission: AD | Admit: 2019-05-28 | Discharge: 2019-06-02 | DRG: 177 | Disposition: A | Discharge status: Skilled Nursing Facility | Payer: Medicare Other | Source: Other Acute Inpatient Hospital | Attending: Internal Medicine | Admitting: Internal Medicine

## 2019-05-28 DIAGNOSIS — I7300 Raynaud's syndrome without gangrene: Secondary | ICD-10-CM | POA: Diagnosis present

## 2019-05-28 DIAGNOSIS — I25118 Atherosclerotic heart disease of native coronary artery with other forms of angina pectoris: Secondary | ICD-10-CM | POA: Diagnosis present

## 2019-05-28 DIAGNOSIS — E782 Mixed hyperlipidemia: Secondary | ICD-10-CM | POA: Diagnosis present

## 2019-05-28 DIAGNOSIS — Z882 Allergy status to sulfonamides status: Secondary | ICD-10-CM | POA: Diagnosis not present

## 2019-05-28 DIAGNOSIS — Z8249 Family history of ischemic heart disease and other diseases of the circulatory system: Secondary | ICD-10-CM | POA: Diagnosis not present

## 2019-05-28 DIAGNOSIS — J9601 Acute respiratory failure with hypoxia: Secondary | ICD-10-CM | POA: Diagnosis present

## 2019-05-28 DIAGNOSIS — J1289 Other viral pneumonia: Secondary | ICD-10-CM | POA: Diagnosis present

## 2019-05-28 DIAGNOSIS — Z66 Do not resuscitate: Secondary | ICD-10-CM | POA: Diagnosis present

## 2019-05-28 DIAGNOSIS — Z1159 Encounter for screening for other viral diseases: Secondary | ICD-10-CM

## 2019-05-28 DIAGNOSIS — J84112 Idiopathic pulmonary fibrosis: Secondary | ICD-10-CM | POA: Diagnosis not present

## 2019-05-28 DIAGNOSIS — I340 Nonrheumatic mitral (valve) insufficiency: Secondary | ICD-10-CM | POA: Diagnosis present

## 2019-05-28 DIAGNOSIS — Z7952 Long term (current) use of systemic steroids: Secondary | ICD-10-CM | POA: Diagnosis not present

## 2019-05-28 DIAGNOSIS — U071 COVID-19: Principal | ICD-10-CM

## 2019-05-28 DIAGNOSIS — M069 Rheumatoid arthritis, unspecified: Secondary | ICD-10-CM | POA: Diagnosis present

## 2019-05-28 DIAGNOSIS — Z7982 Long term (current) use of aspirin: Secondary | ICD-10-CM

## 2019-05-28 DIAGNOSIS — Z90710 Acquired absence of both cervix and uterus: Secondary | ICD-10-CM | POA: Diagnosis not present

## 2019-05-28 DIAGNOSIS — J069 Acute upper respiratory infection, unspecified: Secondary | ICD-10-CM | POA: Diagnosis present

## 2019-05-28 DIAGNOSIS — J8410 Pulmonary fibrosis, unspecified: Secondary | ICD-10-CM | POA: Diagnosis present

## 2019-05-28 DIAGNOSIS — K219 Gastro-esophageal reflux disease without esophagitis: Secondary | ICD-10-CM | POA: Diagnosis present

## 2019-05-28 DIAGNOSIS — Z79899 Other long term (current) drug therapy: Secondary | ICD-10-CM

## 2019-05-28 DIAGNOSIS — Z825 Family history of asthma and other chronic lower respiratory diseases: Secondary | ICD-10-CM | POA: Diagnosis not present

## 2019-05-28 DIAGNOSIS — I10 Essential (primary) hypertension: Secondary | ICD-10-CM | POA: Diagnosis present

## 2019-05-28 DIAGNOSIS — Z888 Allergy status to other drugs, medicaments and biological substances status: Secondary | ICD-10-CM

## 2019-05-28 DIAGNOSIS — E785 Hyperlipidemia, unspecified: Secondary | ICD-10-CM | POA: Diagnosis present

## 2019-05-28 DIAGNOSIS — K224 Dyskinesia of esophagus: Secondary | ICD-10-CM | POA: Diagnosis present

## 2019-05-28 DIAGNOSIS — M349 Systemic sclerosis, unspecified: Secondary | ICD-10-CM | POA: Diagnosis present

## 2019-05-28 LAB — HEPATIC FUNCTION PANEL
ALT: 21 U/L (ref 0–44)
AST: 38 U/L (ref 15–41)
Albumin: 2.9 g/dL — ABNORMAL LOW (ref 3.5–5.0)
Alkaline Phosphatase: 69 U/L (ref 38–126)
Bilirubin, Direct: 0.1 mg/dL (ref 0.0–0.2)
Indirect Bilirubin: 0.3 mg/dL (ref 0.3–0.9)
Total Bilirubin: 0.4 mg/dL (ref 0.3–1.2)
Total Protein: 5.8 g/dL — ABNORMAL LOW (ref 6.5–8.1)

## 2019-05-28 LAB — CBC
HCT: 32.7 % — ABNORMAL LOW (ref 36.0–46.0)
Hemoglobin: 10.1 g/dL — ABNORMAL LOW (ref 12.0–15.0)
MCH: 28.1 pg (ref 26.0–34.0)
MCHC: 30.9 g/dL (ref 30.0–36.0)
MCV: 90.8 fL (ref 80.0–100.0)
Platelets: 328 10*3/uL (ref 150–400)
RBC: 3.6 MIL/uL — ABNORMAL LOW (ref 3.87–5.11)
RDW: 15.4 % (ref 11.5–15.5)
WBC: 4.5 10*3/uL (ref 4.0–10.5)
nRBC: 0 % (ref 0.0–0.2)

## 2019-05-28 LAB — FERRITIN: Ferritin: 100 ng/mL (ref 11–307)

## 2019-05-28 LAB — CREATININE, SERUM
Creatinine, Ser: 0.82 mg/dL (ref 0.44–1.00)
GFR calc Af Amer: >60 mL/min (ref >60)
GFR calc non Af Amer: >60 mL/min (ref >60)

## 2019-05-28 LAB — ABO/RH: ABO/RH(D): O POS

## 2019-05-28 LAB — SARS CORONAVIRUS 2 BY RT PCR (HOSPITAL ORDER, PERFORMED IN ~~LOC~~ HOSPITAL LAB): SARS Coronavirus 2: POSITIVE — AB

## 2019-05-28 LAB — C-REACTIVE PROTEIN: CRP: 9 mg/dL — ABNORMAL HIGH (ref <1.0)

## 2019-05-28 LAB — D-DIMER, QUANTITATIVE: D-Dimer, Quant: 5.88 ug/mL-FEU — ABNORMAL HIGH (ref 0.00–0.50)

## 2019-05-28 LAB — GLUCOSE, CAPILLARY: Glucose-Capillary: 142 mg/dL — ABNORMAL HIGH (ref 70–99)

## 2019-05-28 MED ORDER — LORAZEPAM 2 MG/ML IJ SOLN
0.5000 mg | Freq: Once | INTRAMUSCULAR | Status: AC
  Administered 2019-05-28: 0.5 mg via INTRAVENOUS
  Filled 2019-05-28: qty 1

## 2019-05-28 MED ORDER — SODIUM CHLORIDE 0.9 % IV SOLN
200.0000 mg | Freq: Once | INTRAVENOUS | Status: AC
  Administered 2019-05-28: 200 mg via INTRAVENOUS
  Filled 2019-05-28: qty 40

## 2019-05-28 MED ORDER — HEPARIN SODIUM (PORCINE) 5000 UNIT/ML IJ SOLN: 5000.0000 [IU] | Freq: Three times a day (TID) | INTRAMUSCULAR | Status: DC

## 2019-05-28 MED ORDER — POLYETHYLENE GLYCOL 3350 17 G PO PACK
17.0000 g | PACK | Freq: Every day | ORAL | Status: DC
  Administered 2019-05-28 – 2019-06-02 (×4): 17 g via ORAL
  Filled 2019-05-28 (×4): qty 1

## 2019-05-28 MED ORDER — DULOXETINE HCL 60 MG PO CPEP
60.0000 mg | ORAL_CAPSULE | Freq: Every day | ORAL | Status: DC
  Administered 2019-05-28 – 2019-06-02 (×6): 60 mg via ORAL
  Filled 2019-05-28 (×7): qty 1

## 2019-05-28 MED ORDER — ZINC SULFATE 220 (50 ZN) MG PO CAPS
220.0000 mg | ORAL_CAPSULE | Freq: Every day | ORAL | Status: DC
  Administered 2019-05-28 – 2019-06-02 (×6): 220 mg via ORAL
  Filled 2019-05-28 (×6): qty 1

## 2019-05-28 MED ORDER — PANTOPRAZOLE SODIUM 40 MG PO TBEC
40.0000 mg | DELAYED_RELEASE_TABLET | Freq: Every day | ORAL | Status: DC
  Administered 2019-05-28 – 2019-06-02 (×6): 40 mg via ORAL
  Filled 2019-05-28 (×6): qty 1

## 2019-05-28 MED ORDER — ENOXAPARIN SODIUM 40 MG/0.4ML ~~LOC~~ SOLN
40.0000 mg | SUBCUTANEOUS | Status: DC
  Administered 2019-05-28 – 2019-05-29 (×2): 40 mg via SUBCUTANEOUS
  Filled 2019-05-28 (×2): qty 0.4

## 2019-05-28 MED ORDER — ASPIRIN EC 81 MG PO TBEC
81.0000 mg | DELAYED_RELEASE_TABLET | Freq: Every day | ORAL | Status: DC
  Administered 2019-05-29 – 2019-06-02 (×5): 81 mg via ORAL
  Filled 2019-05-28 (×5): qty 1

## 2019-05-28 MED ORDER — AMLODIPINE BESYLATE 5 MG PO TABS
10.0000 mg | ORAL_TABLET | Freq: Two times a day (BID) | ORAL | Status: DC
  Administered 2019-05-28 – 2019-06-02 (×10): 10 mg via ORAL
  Filled 2019-05-28 (×10): qty 2

## 2019-05-28 MED ORDER — OXYCODONE HCL 5 MG PO TABS
2.5000 mg | ORAL_TABLET | Freq: Four times a day (QID) | ORAL | Status: DC | PRN
  Administered 2019-05-30 – 2019-06-02 (×7): 2.5 mg via ORAL
  Filled 2019-05-28 (×7): qty 1

## 2019-05-28 MED ORDER — GABAPENTIN 300 MG PO CAPS
300.0000 mg | ORAL_CAPSULE | Freq: Two times a day (BID) | ORAL | Status: DC
  Administered 2019-05-28 – 2019-06-02 (×10): 300 mg via ORAL
  Filled 2019-05-28 (×10): qty 1

## 2019-05-28 MED ORDER — IOHEXOL 350 MG/ML SOLN
75.0000 mL | Freq: Once | INTRAVENOUS | Status: AC | PRN
  Administered 2019-05-28: 75 mL via INTRAVENOUS

## 2019-05-28 MED ORDER — NINTEDANIB ESYLATE 100 MG PO CAPS: 100.0000 mg | ORAL_CAPSULE | Freq: Two times a day (BID) | ORAL | Status: DC

## 2019-05-28 MED ORDER — FAMOTIDINE 20 MG PO TABS
20.0000 mg | ORAL_TABLET | Freq: Two times a day (BID) | ORAL | Status: DC
  Administered 2019-05-28 – 2019-06-02 (×10): 20 mg via ORAL
  Filled 2019-05-28 (×10): qty 1

## 2019-05-28 MED ORDER — LATANOPROST 0.005 % OP SOLN
1.0000 [drp] | Freq: Every day | OPHTHALMIC | Status: DC
  Administered 2019-05-28 – 2019-06-01 (×5): 1 [drp] via OPHTHALMIC
  Filled 2019-05-28: qty 2.5

## 2019-05-28 MED ORDER — BENZONATATE 100 MG PO CAPS
200.0000 mg | ORAL_CAPSULE | Freq: Three times a day (TID) | ORAL | Status: DC | PRN
  Administered 2019-05-28 – 2019-05-29 (×3): 200 mg via ORAL
  Filled 2019-05-28 (×3): qty 2

## 2019-05-28 MED ORDER — OXYCODONE-ACETAMINOPHEN 5-325 MG PO TABS
1.0000 | ORAL_TABLET | Freq: Four times a day (QID) | ORAL | Status: DC | PRN
  Administered 2019-05-28 – 2019-06-02 (×6): 1 via ORAL
  Filled 2019-05-28 (×6): qty 1

## 2019-05-28 MED ORDER — HYDROXYCHLOROQUINE SULFATE 200 MG PO TABS: 200.0000 mg | ORAL_TABLET | Freq: Two times a day (BID) | ORAL | Status: DC

## 2019-05-28 MED ORDER — ZOLPIDEM TARTRATE 5 MG PO TABS
5.0000 mg | ORAL_TABLET | Freq: Every evening | ORAL | Status: DC | PRN
  Administered 2019-05-31 – 2019-06-01 (×2): 5 mg via ORAL
  Filled 2019-05-28 (×2): qty 1

## 2019-05-28 MED ORDER — HYDROCOD POLST-CPM POLST ER 10-8 MG/5ML PO SUER: 5.0000 mL | Freq: Two times a day (BID) | ORAL | Status: DC | PRN

## 2019-05-28 MED ORDER — METHOCARBAMOL 500 MG PO TABS
500.0000 mg | ORAL_TABLET | Freq: Three times a day (TID) | ORAL | Status: DC | PRN
  Administered 2019-06-02: 500 mg via ORAL
  Filled 2019-05-28: qty 1

## 2019-05-28 MED ORDER — METHYLPREDNISOLONE SODIUM SUCC 125 MG IJ SOLR
80.0000 mg | Freq: Three times a day (TID) | INTRAMUSCULAR | Status: DC
  Administered 2019-05-28 – 2019-05-31 (×9): 80 mg via INTRAVENOUS
  Filled 2019-05-28 (×9): qty 2

## 2019-05-28 MED ORDER — SODIUM CHLORIDE 0.9 % IV SOLN
500.0000 mg | Freq: Once | INTRAVENOUS | Status: AC
  Administered 2019-05-28: 500 mg via INTRAVENOUS
  Filled 2019-05-28 (×2): qty 500

## 2019-05-28 MED ORDER — QUETIAPINE FUMARATE 25 MG PO TABS
25.0000 mg | ORAL_TABLET | Freq: Every day | ORAL | Status: DC
  Administered 2019-05-28 – 2019-06-01 (×5): 25 mg via ORAL
  Filled 2019-05-28 (×6): qty 1

## 2019-05-28 MED ORDER — OMEGA-3-ACID ETHYL ESTERS 1 G PO CAPS
1.0000 g | ORAL_CAPSULE | Freq: Two times a day (BID) | ORAL | Status: DC
  Administered 2019-05-28 – 2019-06-02 (×10): 1 g via ORAL
  Filled 2019-05-28 (×12): qty 1

## 2019-05-28 MED ORDER — AMLODIPINE BESYLATE 5 MG PO TABS: 10.0000 mg | ORAL_TABLET | Freq: Every day | ORAL | Status: DC

## 2019-05-28 MED ORDER — SODIUM CHLORIDE 0.9 % IV SOLN
100.0000 mg | INTRAVENOUS | Status: AC
  Administered 2019-05-29 – 2019-06-01 (×4): 100 mg via INTRAVENOUS
  Filled 2019-05-28 (×4): qty 20

## 2019-05-28 MED ORDER — VITAMIN C 500 MG PO TABS
500.0000 mg | ORAL_TABLET | Freq: Every day | ORAL | Status: DC
  Administered 2019-05-28 – 2019-06-02 (×6): 500 mg via ORAL
  Filled 2019-05-28 (×6): qty 1

## 2019-05-28 MED ORDER — ALPRAZOLAM 0.5 MG PO TABS
0.5000 mg | ORAL_TABLET | Freq: Four times a day (QID) | ORAL | Status: DC | PRN
  Administered 2019-05-28 – 2019-06-01 (×5): 0.5 mg via ORAL
  Filled 2019-05-28 (×5): qty 1

## 2019-05-28 MED ORDER — EZETIMIBE 10 MG PO TABS
10.0000 mg | ORAL_TABLET | Freq: Every day | ORAL | Status: DC
  Administered 2019-05-28 – 2019-06-01 (×5): 10 mg via ORAL
  Filled 2019-05-28 (×6): qty 1

## 2019-05-28 MED ORDER — SODIUM CHLORIDE 0.9 % IV SOLN
1.0000 g | Freq: Once | INTRAVENOUS | Status: AC
  Administered 2019-05-28: 1 g via INTRAVENOUS
  Filled 2019-05-28: qty 10

## 2019-05-28 MED ORDER — GUAIFENESIN-DM 100-10 MG/5ML PO SYRP: 10.0000 mL | ORAL_SOLUTION | ORAL | Status: DC | PRN

## 2019-05-28 MED ORDER — LORAZEPAM 0.5 MG PO TABS
0.5000 mg | ORAL_TABLET | Freq: Once | ORAL | Status: AC
  Administered 2019-05-28: 05:00:00 0.5 mg via ORAL
  Filled 2019-05-28: qty 1

## 2019-05-28 MED ORDER — OXYCODONE-ACETAMINOPHEN 7.5-325 MG PO TABS: 1.0000 | ORAL_TABLET | Freq: Four times a day (QID) | ORAL | Status: DC | PRN

## 2019-05-28 NOTE — Progress Notes (Signed)
Spoke with patients primary contact and updated her on patient plan of care and patient status. Family member said for a few days she has told others not to contact patient so patient may rest. Will pass message along to patient. All questions and concerns answered at this time

## 2019-05-28 NOTE — ED Notes (Signed)
PAtient given water

## 2019-05-28 NOTE — ED Notes (Signed)
Attempt iv insertion x1 without success, lea, rn to attempt.

## 2019-05-28 NOTE — Progress Notes (Signed)
Received +COVID-19 transfer patient from [facility] ED.  Patient A&Ox4, transported via EMS on 6L o2 via [place], Spo2 100%.  Decreased o2 to 2L o2 via [place], Spo2 99%.  Patient reports mild dyspnea with activity and non-productive cough, denies SOB at rest, denies fever, but supports "feeling warm."  Patient with significant history of RA, scleroderma, and chronic back pain with recent back sx.  Patient reports weakness and multiple falls in the past, last on Monday 05/23/2019 per patient.  Patient with intact, healing scab noted to her occiput, soft with minimal tenderness to palpation and right lateral hip bruise noted.  Generalized edema and morphea with global splochty rash-like redness 2/2 scleroderma.  Patient uses walker and lives alone at home.  Her POA is her Princella Pellegrini # [phone].    05/28/19 1515  Vitals  Temp 99.2 F (37.3 C)  Temp Source Oral  BP (!) 129/57  MAP (mmHg) 79  BP Location Right Arm  BP Method Automatic  Patient Position (if appropriate) Sitting  Pulse Rate 85  Pulse Rate Source Monitor  Resp (!) 26  Oxygen Therapy  SpO2 99 %  O2 Device Nasal Cannula  O2 Flow Rate (L/min) 2 L/min  Patient Activity (if Appropriate) In bed  Pulse Oximetry Type Continuous  Pain Assessment  Pain Scale 0-10  Pain Score 5  Pain Type Chronic pain  Pain Location Back  MEWS Score  MEWS RR 2  MEWS Pulse 0  MEWS Systolic 0  MEWS LOC 0  MEWS Temp 0  MEWS Score 2  MEWS Score Color Yellow

## 2019-05-28 NOTE — ED Provider Notes (Addendum)
[facility] Emergency Department Provider Note    First MD Initiated Contact with Patient 05/28/19 0225     (approximate)  I have reviewed the triage vital signs and the nursing notes.   HISTORY  Chief Complaint Fall  HPI Erin [name] is a 73 y.o. female with below list of previous medical conditions including short arthritis scleroderma changes states she has pulmonary fibrosis and recently performed kyphoplasty on May 17, 2019 presents to the emergency department today with progressive dyspnea, cough and generalized weakness since surgery.  Patient denies any fever.  Patient states "I just cannot take care of myself anymore.  Patient states that dyspnea is worse with even minimal exertion.  Patient denies any chest pain.        Past Medical History:  Diagnosis Date   Anemia    h/o   Cancer ([place])    melanoma x 3the last one was of her leg and surgeon removed it   Colon polyp    Complication of anesthesia    PT STATED THAT WITH JOINT SURGERY THAT SHE WAS GIVEN SOME TYPE OF ANESTHESIA THAT MADE HER HALLUCINATE    Coronary artery calcification    Coronary artery disease    DDD (degenerative disc disease), lumbar    Diverticulosis 01/20/2017   Dysphagia    Dyspnea    with exertion-unable to walk a mile without getting sob- dr sparks set pt up to see Dr Rockey Situ after 11-19-16 surgery   Environmental allergies    Esophageal dysmotility    Fibrocystic breast disease    GERD (gastroesophageal reflux disease)    Heart murmur    asymptomatic   Hyperlipidemia    unspecified   Hypertension    Mitral regurgitation    Monilial esophagitis ([place]) 01/20/2017   Obstipation    Osteopenia    Peptic ulcer disease    Pulmonary fibrosis ([place])    per Dr Raul Del   RA (rheumatoid arthritis) ([place])    Raynaud's disease    Redundant colon 01/20/2017   Scleroderma ([place])    Telangiectasia of colon    Tubular adenoma of colon  01/20/2017   unspecified   Vulvar dysplasia     Patient Active Problem List   Diagnosis Date Noted   Varicose veins of leg with swelling, bilateral 06/25/2018   Hypertension 70/35/0093   Acute diastolic CHF (congestive heart failure) ([place]) 03/18/2018   Lower extremity edema 03/18/2018   Food in esophagus causing other injury, initial encounter    Reflux esophagitis    Gastric nodule    Symptomatic anemia 02/28/2018   S/P hip hemiarthroplasty 02/26/2018   Hip fracture ([place]) 02/22/2018   Mixed hyperlipidemia 10/26/2017   Coronary artery disease of native artery of native heart with stable angina pectoris ([place]) 10/24/2017   Vulvar irritation 09/02/2017   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) 12/16/2016   Screening for cardiovascular condition 12/15/2016   SOB (shortness of breath) 12/15/2016   Idiopathic interstitial fibrosis of lung syndrome ([place]) 12/15/2016   Scleroderma ([place]) 12/15/2016   Raynaud's disease without gangrene 12/15/2016   Melanoma of skin ([place]) 11/11/2016   Lesion of labia 11/11/2016    Past Surgical History:  Procedure Laterality Date   ABDOMINAL HYSTERECTOMY     BREAST CYST EXCISION Left 20+ years ago   No scar visible   BREAST LUMPECTOMY Right    CARPAL TUNNEL RELEASE Bilateral    COLON SURGERY  colon polyp    COLONOSCOPY  09/30/2011   tubular adenoma rtm   COLONOSCOPY     05/02/2003, 04/10/2000   COLONOSCOPY WITH PROPOFOL N/A 01/20/2017   Procedure: COLONOSCOPY WITH PROPOFOL;  Surgeon: Lollie Sails, MD;  Location: [facility] END[facility]COPY;  Service: Endoscopy;  Laterality: N/A;   ESOPHAGOGASTRODUODENOSCOPY     ESOPHAGOGASTRODUODENOSCOPY     09/30/2011, 04/10/2000 , no repeat rtm   ESOPHAGOGASTRODUODENOSCOPY N/A 03/02/2018   Procedure: ESOPHAGOGASTRODUODENOSCOPY (EGD);  Surgeon: Virgel Manifold, MD;  Location: [facility] ENDOSCOPY;  Service: Endoscopy;   Laterality: N/A;   ESOPHAGOGASTRODUODENOSCOPY (EGD) WITH PROPOFOL N/A 01/20/2017   Procedure: ESOPHAGOGASTRODUODENOSCOPY (EGD) WITH PROPOFOL;  Surgeon: Lollie Sails, MD;  Location: [facility] ENDOSCOPY;  Service: Endoscopy;  Laterality: N/A;   EXCISION HYDRADENITIS LABIA Left 11/19/2016   Procedure: EXCISION LABIAL MASS;  Surgeon: Robert Bellow, MD;  Location: [facility] ORS;  Service: General;  Laterality: Left;   EYE SURGERY Bilateral    cataracts   HEEL SPUR EXCISION     HIP ARTHROPLASTY Left 02/23/2018   Procedure: ARTHROPLASTY BIPOLAR HIP (HEMIARTHROPLASTY);  Surgeon: Thornton Park, MD;  Location: [facility] ORS;  Service: Orthopedics;  Laterality: Left;   KYPHOPLASTY N/A 04/26/2019   Procedure: KYPHOPLASTY T7;  Surgeon: Hessie Knows, MD;  Location: [facility] ORS;  Service: Orthopedics;  Laterality: N/A;   KYPHOPLASTY N/A 05/17/2019   Procedure: KYPHOPLASTY T8;  Surgeon: Hessie Knows, MD;  Location: [facility] ORS;  Service: Orthopedics;  Laterality: N/A;   MASS EXCISION Left 11/19/2016   Procedure: EXCISION LEFT THIGH MELANOMA;  Surgeon: Robert Bellow, MD;  Location: [facility] ORS;  Service: General;  Laterality: Left;   SENTINEL NODE BIOPSY Left 11/19/2016   Procedure: INGUINAL SENTINEL NODE BIOPSY;  Surgeon: Robert Bellow, MD;  Location: [facility] ORS;  Service: General;  Laterality: Left;   UPPER ESOPHAGEAL ENDOSCOPIC ULTRASOUND (EUS) N/A 01/29/2017   Procedure: UPPER ESOPHAGEAL ENDOSCOPIC ULTRASOUND (EUS);  Surgeon: Holly Bodily, MD;  Location: [facility] ENDOSCOPY;  Service: Endoscopy;  Laterality: N/A;    Prior to Admission medications   Medication Sig Start Date End Date Taking? Authorizing Provider  ALPRAZolam Duanne Moron) 0.5 MG tablet Take 0.5 mg by mouth 4 (four) times daily as needed for anxiety.    [provider]  amLODipine (NORVASC) 10 MG tablet Take 10 mg by mouth 2 (two) times daily.     [provider]  amLODipine (NORVASC) 5 MG tablet TAKE ONE TABLET BY MOUTH  EVERY DAY 04/25/19   Minna Merritts, MD  aspirin 81 MG tablet Take 81 mg by mouth daily.     [provider]  dexlansoprazole (DEXILANT) 60 MG capsule Take 1 capsule (60 mg total) by mouth daily. Patient taking differently: Take 60 mg by mouth at bedtime.  03/03/18   Hillary Bow, MD  DULoxetine (CYMBALTA) 30 MG capsule Take 60 mg by mouth every morning.  08/05/18   [provider]  ezetimibe (ZETIA) 10 MG tablet Take 1 tablet (10 mg total) by mouth daily. Patient taking differently: Take 10 mg by mouth at bedtime.  02/09/19   Minna Merritts, MD  furosemide (LASIX) 20 MG tablet 20 mg daily.  08/04/16   [provider]  gabapentin (NEURONTIN) 300 MG capsule Take 300 mg by mouth 2 (two) times daily.     [provider]  hydroxychloroquine (PLAQUENIL) 200 MG tablet Take 200 mg by mouth 2 (two) times daily.  03/16/17   [provider]  latanoprost (XALATAN) 0.005 % ophthalmic solution Place 1  drop into both eyes at bedtime.     [provider]  Nintedanib (OFEV) 100 MG CAPS Take 1 capsule (100 mg total) by mouth 2 (two) times daily. 11/26/18   Wilhelmina Mcardle, MD  Omega-3 Fatty Acids (FISH OIL) 1000 MG CAPS Take 1 capsule by mouth 2 (two) times daily.     [provider]  oxyCODONE-acetaminophen (PERCOCET) 7.5-325 MG tablet Take 1 tablet by mouth every 4 (four) hours as needed for severe pain. 04/26/19   Hessie Knows, MD  polyethylene glycol [facility] / Floria Raveling) packet Take 17 g by mouth daily.     [provider]  predniSONE (DELTASONE) 5 MG tablet Take 5 mg by mouth 2 (two) times daily with a meal.     [provider]  simvastatin (ZOCOR) 20 MG tablet Take 20 mg by mouth at bedtime.    [provider]  zolpidem (AMBIEN) 5 MG tablet Take 1 tablet (5 mg total) by mouth at bedtime as needed. 03/17/18   Toni Arthurs, NP    Allergies Remicade [infliximab], Sulfa antibiotics, Actonel [risedronate sodium], Fosamax  [alendronate sodium], Procardia [nifedipine], Sulfur, and Wellbutrin [bupropion]  Family History  Problem Relation Age of Onset   Endometrial cancer Mother 36   Hypertension Mother    Osteoporosis Mother    Bladder Cancer Father    COPD Father    Cerebral palsy Sister     Social History Social History   Tobacco Use   Smoking status: Never Smoker   Smokeless tobacco: Never Used  Substance Use Topics   Alcohol use: No   Drug use: No    Review of Systems Constitutional: No fever/chills Eyes: No visual changes. ENT: No sore throat. Cardiovascular: Denies chest pain. Respiratory:   Positive for dyspnea and cough Gastrointestinal: No abdominal pain.  No nausea, no vomiting.  No diarrhea.  No constipation. Genitourinary: Negative for dysuria. Musculoskeletal: Negative for neck pain.  Negative for back pain. Integumentary: Negative for rash. Neurological: Negative for headaches, focal weakness or numbness.   ____________________________________________   PHYSICAL EXAM:  VITAL SIGNS: ED Triage Vitals  Enc Vitals Group     BP 05/27/19 2004 109/60     Pulse Rate 05/27/19 2004 76     Resp 05/27/19 2004 20     Temp 05/27/19 2004 98.7 F (37.1 C)     Temp Source 05/27/19 2004 Oral     SpO2 05/27/19 2004 (!) 89 %     Weight 05/27/19 2002 83.9 kg (185 lb)     Height 05/27/19 2002 1.575 m (5\' 2" )     Head Circumference --      Peak Flow --      Pain Score 05/27/19 2001 10     Pain Loc --      Pain Edu? --      Excl. in [place]? --     Constitutional: Alert and oriented. Well appearing and in no acute distress. Eyes: Conjunctivae are normal. PERRL. EOMI. Mouth/Throat: Mucous membranes are moist.  Oropharynx non-erythematous. Neck: No stridor.   Cardiovascular: Normal rate, regular rhythm. Good peripheral circulation. Grossly normal heart sounds. Respiratory: Diffuse coarse rhonchi. Gastrointestinal: Soft and nontender. No distention.  Musculoskeletal: No lower  extremity tenderness nor edema. No gross deformities of extremities. Neurologic:  Normal speech and language. No gross focal neurologic deficits are appreciated.  Skin:  Skin is warm, dry and intact. No rash noted. Psychiatric: Mood and affect are normal. Speech and behavior are normal.  ____________________________________________  LABS (all labs ordered are listed, but only abnormal results are displayed)  Labs Reviewed  SARS CORONAVIRUS 2 ([place], [place] [facility]) - Abnormal; Notable for the following components:      Result Value   SARS Coronavirus 2 POSITIVE (*)    All other components within normal limits  CBC WITH DIFFERENTIAL/PLATELET - Abnormal; Notable for the following components:   RBC 3.84 (*)    Hemoglobin 10.9 (*)    HCT 34.6 (*)    RDW 15.6 (*)    nRBC 0.4 (*)    Abs Immature Granulocytes 0.09 (*)    All other components within normal limits  BASIC METABOLIC PANEL - Abnormal; Notable for the following components:   Glucose, Bld 103 (*)    Creatinine, Ser 1.11 (*)    Calcium 7.7 (*)    GFR calc non Af Amer 49 (*)    GFR calc Af Amer 57 (*)    All other components within normal limits  TROPONIN I   ____________________________________________  EKG ED ECG REPORT I, [place] N Andric Kerce, the attending physician, personally viewed and interpreted this ECG.   Date: 05/28/2019  EKG Time: 8:13 PM  Rate: 78  Rhythm: Normal sinus rhythm  Axis: Normal  Intervals: Normal  ST&T Change: None  ________________  RADIOLOGY I, [place] N Cherree Conerly, personally viewed and evaluated these images (plain radiographs) as part of my medical decision making, as well as reviewing the written report by the radiologist.  ED MD interpretation: Low lung volumes noted on chest x-ray.  CT angiogram of the chest revealed no evidence of pulmonary emboli however findings suspicious acute multilobar atypical pneumonia superimposed on fibrotic changes.  Official  radiology report(s): Dg Chest 1 View  Result Date: 05/27/2019 CLINICAL DATA:  Shortness of breath. EXAM: CHEST  1 VIEW COMPARISON:  Radiograph 10/05/2018. High-resolution chest CT 11/02/2018 FINDINGS: Lower lung volumes from prior exam. Chronic interstitial lung disease with interstitial coarsening and bibasilar honeycombing. Findings are grossly similar. Unchanged heart size and mediastinal contours. No acute airspace disease. No large pleural effusion. No pneumothorax. Kyphoplasty visualized in the midthoracic spine. Patient's chin obscures the apices. IMPRESSION: Low lung volumes and chronic interstitial lung disease. No acute abnormalities. Electronically Signed   By: Keith Rake M.D.   On: 05/27/2019 22:37   Ct Angio Chest Pe W And/or Wo Contrast  Result Date: 05/28/2019 CLINICAL DATA:  Chest pain.  Recent fall. EXAM: CT ANGIOGRAPHY CHEST WITH CONTRAST TECHNIQUE: Multidetector CT imaging of the chest was performed using the standard protocol during bolus administration of intravenous contrast. Multiplanar CT image reconstructions and MIPs were obtained to evaluate the vascular anatomy. CONTRAST:  63mL OMNIPAQUE IOHEXOL 350 MG/ML SOLN COMPARISON:  Chest radiograph from one day prior. 11/02/2018 high-resolution chest CT. FINDINGS: Cardiovascular: The study is moderate quality for the evaluation of pulmonary embolism, degraded by motion. There are no filling defects in the central, lobar, segmental or subsegmental pulmonary artery branches to suggest acute pulmonary embolism. Normal course and caliber of the thoracic aorta. Dilated main pulmonary artery (3.6 cm diameter), stable. Normal heart size. No significant pericardial fluid/thickening. Left anterior descending coronary atherosclerosis. Mediastinum/Nodes: No discrete thyroid nodules. Patulous debris-filled thoracic esophagus. No axillary adenopathy. Mildly enlarged 1.0 cm left prevascular node (series 5/image 88), new. Newly enlarged 1.3 cm  subcarinal node (series 5/image 112). New mild bilateral hilar adenopathy measuring 1.1 cm on the right and 1.2 cm on the left. Lungs/Pleura: No pneumothorax. No significant pleural effusions. There is new  patchy consolidation and ground-glass opacity throughout both lungs involving the upper and lower lobes bilaterally, superimposed on a background of patchy confluent peripheral reticulation, ground-glass attenuation, moderate traction bronchiectasis and architectural distortion with a basilar predominance. No discrete lung masses or significant pulmonary nodules. Upper abdomen: Simple 2.2 cm right liver lobe cyst. Granulomatous splenic calcifications. Small hiatal hernia. Musculoskeletal: No aggressive appearing focal osseous lesions. Multiple healed right rib deformities. Severe T7 and T8 vertebral compression fracture status post vertebroplasty with exaggerated thoracic kyphosis. Moderate thoracic spondylosis. Review of the MIP images confirms the above findings. IMPRESSION: 1. No evidence of pulmonary embolism. 2. Findings are suggestive of acute multilobar atypical pneumonia superimposed on a background of fibrotic interstitial lung disease (as characterized on 11/02/2018 high-resolution chest CT study). There are a spectrum of findings in the lungs which can be seen with acute atypical infection (as well as other non-infectious etiologies). In particular, viral pneumonia (including COVID-19) should be considered in the appropriate clinical setting. 3. New mild mediastinal and bilateral hilar lymphadenopathy, nonspecific, probably reactive. 4. Stable dilated main pulmonary artery, suggesting chronic pulmonary arterial hypertension. 5. Patulous debris-filled thoracic esophagus. Aortic Atherosclerosis (ICD10-I70.0). These results were called by telephone at the time of interpretation on 05/28/2019 at 4:17 am to Dr. Marjean Donna , who verbally acknowledged these results. Electronically Signed   By: Ilona Sorrel  M.D.   On: 05/28/2019 04:17     .Critical Care Performed by: Gregor Hams, MD Authorized by: Gregor Hams, MD   Critical care provider statement:    Critical care time (minutes):  60   Critical care time was exclusive of:  Separately billable procedures and treating other patients (COVID-19 infection)   Critical care was time spent personally by me on the following activities:  Development of treatment plan with patient or surrogate, discussions with consultants, evaluation of patient's response to treatment, examination of patient, obtaining history from patient or surrogate, ordering and performing treatments and interventions, ordering and review of laboratory studies, ordering and review of radiographic studies, pulse oximetry, re-evaluation of patient's condition and review of old charts     ____________________________________________   INITIAL IMPRESSION / MDM / ASSESSMENT AND PLAN / ED COURSE  As part of my medical decision making, I reviewed the following data within the electronic MEDICAL RECORD NUMBER 73 year old female presenting with above-stated history and physical exam with concern for pneumonia pulmonary emboli and COVID-19.  Patient hypoxic with oxygen saturation as low as 86% on my initial evaluation.  Patient was placed on 2 L of oxygen via nasal cannula.  Chest x-ray not consistent with clinical findings and as such CT scan of the chest performed to evaluate for pulmonary emboli or pneumonia.  Atypical pneumonia noted on CT.  Patient's [id number] positive.  Patient discussed with Dr[facility]Ghimire at [facility] for transfer.  Patient given IV ceftriaxone and azithromycin following CT scan results  *GEARLENE GODSIL was evaluated in Emergency Department on 05/28/2019 for the symptoms described in the history of present illness. She was evaluated in the context of the global COVID-19 pandemic, which necessitated consideration that the patient might be at risk for infection with  the SARS-CoV-2 virus that causes COVID-19. Institutional protocols and algorithms that pertain to the evaluation of patients at risk for COVID-19 are in a state of rapid change based on information released by regulatory bodies including the CDC and federal and state organizations. These policies and algorithms were followed during the patient's care in the ED.  Some ED evaluations  and interventions may be delayed as a result of limited staffing during the pandemic.*    ____________________________________________  FINAL CLINICAL IMPRESSION(S) / ED DIAGNOSES  Final diagnoses:  [id number] virus infection     MEDICATIONS GIVEN DURING THIS VISIT:  Medications  azithromycin (ZITHROMAX) 500 mg in sodium chloride 0.9 % 250 mL IVPB (has no administration in time range)  iohexol (OMNIPAQUE) 350 MG/ML injection 75 mL (75 mLs Intravenous Contrast Given 05/28/19 0339)  cefTRIAXone (ROCEPHIN) 1 g in sodium chloride 0.9 % 100 mL IVPB (0 g Intravenous Stopped 05/28/19 0549)  LORazepam (ATIVAN) tablet 0.5 mg (0.5 mg Oral Given 05/28/19 0438)     ED Discharge Orders    None       Note:  This document was prepared using Dragon voice recognition software and may include unintentional dictation errors.   Gregor Hams, MD 05/28/19 4239    Gregor Hams, MD 05/28/19 [phone]

## 2019-05-28 NOTE — ED Notes (Signed)
Pt increased to 6lpm via [place] for pox of 90% on 3lpm with slight exertion. Pt complains of less shob when oxygen increased.

## 2019-05-28 NOTE — Plan of Care (Signed)
  Problem: Education: Goal: Knowledge of risk factors and measures for prevention of condition will improve Outcome: Progressing   Problem: Coping: Goal: Psychosocial and spiritual needs will be supported Outcome: Progressing   Problem: Respiratory: Goal: Complications related to the disease process, condition or treatment will be avoided or minimized Outcome: Progressing   Problem: Coping: Goal: Level of anxiety will decrease Outcome: Progressing

## 2019-05-28 NOTE — Progress Notes (Signed)
Pharmacy Brief Note - Remdesivir Dosing   O:  ALT: 21 CT (6/20):  Acute multilobar atypical pneumonia superimposed on a background of fibrotic interstitial lung disease SpO2: 93% on room air per RN.  Per MD, O2 sat 89% and needing to go back on supplemental O2.   A/P:  Remdesivir 200 mg once followed by 100 mg daily x 4 days.  Monitor ALT, clinical progress  Peggyann Juba, PharmD, [place] Pharmacist Pharmacy: [phone] 05/28/2019 6:44 PM

## 2019-05-28 NOTE — ED Notes (Signed)
Son given update that patient was in treatment room awaiting MD assessment.

## 2019-05-28 NOTE — ED Notes (Signed)
HEPA placed in pt's room by dr. Owens Shark.

## 2019-05-28 NOTE — H&P (Signed)
TRH H&P   Patient Demographics:    Erin [name], is a 73 y.o. female  MRN: [id number]   DOB - 12/27/1945  Admit Date - 05/28/2019  Outpatient Primary MD for the patient is Doy Hutching [name], MD  Referring MD/NP/PA: [facility]  Outpatient Specialists: Otology Dr. Jefm Bryant, pulmonary Dr. Jamal Collin  Patient coming from: Home  No chief complaint on file.     HPI:    Erin [name]  is a 73 y.o. female, with past medical history of rheumatoid arthritis, scleroderma, Raynaud's phenomena, hypertension, [name], hyperlipidemia, tissue lung disease, and kyphoplasty 05/17/2019 at [facility], presents due to complaints of shortness of breath, patient had kyphoplasty performed on May 17, 2019 for T7/T8 fracture, ports of progressive dyspnea over the last 3 days, cough, nonproductive, generalized weakness, denies any fever, ports of progressive weakness, poor appetite, initially dyspnea on exertion, currently even at rest. - in ED patient was COVID-19 positive, she presents from [facility] ED on 6 L nasal cannula, currently she is on 2 L, PA chest negative for PE, but significant for bilateral opacities , and was transferred to [facility] for further care.    Review of systems:    In addition to the HPI above,  No Fever-chills, she reports generalized weakness, fatigue, loss of appetite  no Headache, No changes with Vision or hearing, No problems swallowing food or Liquids, No Chest pain, reports cough and shortness of breath No Abdominal pain, No Nausea or Vommitting, Bowel movements are regular, No Blood in stool or Urine, No dysuria, No new skin rashes or bruises, No new joints pains-aches,  No new weakness, tingling, numbness in any extremity, No recent weight gain or loss, No polyuria, polydypsia or polyphagia, No significant Mental Stressors.  A full 10 point Review of Systems was done, except as stated above,  all other Review of Systems were negative.   With Past History of the following :    Past Medical History:  Diagnosis Date   Anemia    h/o   Cancer ([place])    melanoma x 3the last one was of her leg and surgeon removed it   Colon polyp    Complication of anesthesia    PT STATED THAT WITH JOINT SURGERY THAT SHE WAS GIVEN SOME TYPE OF ANESTHESIA THAT MADE HER HALLUCINATE    Coronary artery calcification    Coronary artery disease    DDD (degenerative disc disease), lumbar    Diverticulosis 01/20/2017   Dysphagia    Dyspnea    with exertion-unable to walk a mile without getting sob- dr sparks set pt up to see Dr Rockey Situ after 11-19-16 surgery   Environmental allergies    Esophageal dysmotility    Fibrocystic breast disease    GERD (gastroesophageal reflux disease)    Heart murmur    asymptomatic   Hyperlipidemia    unspecified   Hypertension    Mitral regurgitation  Monilial esophagitis ([place]) 01/20/2017   Obstipation    Osteopenia    Peptic ulcer disease    Pulmonary fibrosis ([place])    per Dr Raul Del   RA (rheumatoid arthritis) [facility])    Raynaud's disease    Redundant colon 01/20/2017   Scleroderma ([place])    Telangiectasia of colon    Tubular adenoma of colon 01/20/2017   unspecified   Vulvar dysplasia       Past Surgical History:  Procedure Laterality Date   ABDOMINAL HYSTERECTOMY     BREAST CYST EXCISION Left 20+ years ago   No scar visible   BREAST LUMPECTOMY Right    CARPAL TUNNEL RELEASE Bilateral    COLON SURGERY     colon polyp    COLONOSCOPY  09/30/2011   tubular adenoma rtm   COLONOSCOPY     05/02/2003, 04/10/2000   COLONOSCOPY WITH PROPOFOL N/A 01/20/2017   Procedure: COLONOSCOPY WITH PROPOFOL;  Surgeon: Lollie Sails, MD;  Location: [facility] ENDOSCOPY;  Service: Endoscopy;  Laterality: N/A;   ESOPHAGOGASTRODUODENOSCOPY     ESOPHAGOGASTRODUODENOSCOPY     09/30/2011, 04/10/2000 , no repeat rtm    ESOPHAGOGASTRODUODENOSCOPY N/A 03/02/2018   Procedure: ESOPHAGOGASTRODUODENOSCOPY (EGD);  Surgeon: Virgel Manifold, MD;  Location: [facility] ENDOSCOPY;  Service: Endoscopy;  Laterality: N/A;   ESOPHAGOGASTRODUODENOSCOPY (EGD) WITH PROPOFOL N/A 01/20/2017   Procedure: ESOPHAGOGASTRODUODENOSCOPY (EGD) WITH PROPOFOL;  Surgeon: Lollie Sails, MD;  Location: [facility] ENDOSCOPY;  Service: Endoscopy;  Laterality: N/A;   EXCISION HYDRADENITIS LABIA Left 11/19/2016   Procedure: EXCISION LABIAL MASS;  Surgeon: Robert Bellow, MD;  Location: [facility] ORS;  Service: General;  Laterality: Left;   EYE SURGERY Bilateral    cataracts   HEEL SPUR EXCISION     HIP ARTHROPLASTY Left 02/23/2018   Procedure: ARTHROPLASTY BIPOLAR HIP (HEMIARTHROPLASTY);  Surgeon: Thornton Park, MD;  Location: [facility] ORS;  Service: Orthopedics;  Laterality: Left;   KYPHOPLASTY N/A 04/26/2019   Procedure: KYPHOPLASTY T7;  Surgeon: Hessie Knows, MD;  Location: [facility] ORS;  Service: Orthopedics;  Laterality: N/A;   KYPHOPLASTY N/A 05/17/2019   Procedure: KYPHOPLASTY T8;  Surgeon: Hessie Knows, MD;  Location: [facility] ORS;  Service: Orthopedics;  Laterality: N/A;   MASS EXCISION Left 11/19/2016   Procedure: EXCISION LEFT THIGH MELANOMA;  Surgeon: Robert Bellow, MD;  Location: [facility] ORS;  Service: General;  Laterality: Left;   SENTINEL NODE BIOPSY Left 11/19/2016   Procedure: INGUINAL SENTINEL NODE BIOPSY;  Surgeon: Robert Bellow, MD;  Location: [facility] ORS;  Service: General;  Laterality: Left;   UPPER ESOPHAGEAL ENDOSCOPIC ULTRASOUND (EUS) N/A 01/29/2017   Procedure: UPPER ESOPHAGEAL ENDOSCOPIC ULTRASOUND (EUS);  Surgeon: Holly Bodily, MD;  Location: [facility] ENDOSCOPY;  Service: Endoscopy;  Laterality: N/A;      Social History:     Social History   Tobacco Use   Smoking status: Never Smoker   Smokeless tobacco: Never Used  Substance Use Topics   Alcohol use: No     Lives - at home  Mobility -walker    Family  History :     Family History  Problem Relation Age of Onset   Endometrial cancer Mother 30   Hypertension Mother    Osteoporosis Mother    Bladder Cancer Father    COPD Father    Cerebral palsy Sister      Home Medications:   Prior to Admission medications   Medication Sig Start Date End Date Taking? Authorizing Provider  ALPRAZolam Duanne Moron) 0.5 MG tablet Take 0.5 mg  by mouth 4 (four) times daily as needed for anxiety.    [provider]  amLODipine (NORVASC) 10 MG tablet Take 10 mg by mouth 2 (two) times daily.     [provider]  amLODipine (NORVASC) 5 MG tablet TAKE ONE TABLET BY MOUTH EVERY DAY Patient not taking: Reported on 05/28/2019 04/25/19   Minna Merritts, MD  aspirin 81 MG tablet Take 81 mg by mouth daily.     [provider]  benzonatate (TESSALON) 200 MG capsule Take 200 mg by mouth 3 (three) times daily as needed for cough. 05/24/19   [provider]  dexlansoprazole (DEXILANT) 60 MG capsule Take 1 capsule (60 mg total) by mouth daily. Patient taking differently: Take 60 mg by mouth at bedtime.  03/03/18   Hillary Bow, MD  DULoxetine (CYMBALTA) 30 MG capsule Take 60 mg by mouth every morning.  08/05/18   [provider]  ezetimibe (ZETIA) 10 MG tablet Take 1 tablet (10 mg total) by mouth daily. Patient taking differently: Take 10 mg by mouth at bedtime.  02/09/19   Minna Merritts, MD  furosemide (LASIX) 20 MG tablet 20 mg daily.  08/04/16   [provider]  gabapentin (NEURONTIN) 300 MG capsule Take 300 mg by mouth 2 (two) times daily.     [provider]  hydroxychloroquine (PLAQUENIL) 200 MG tablet Take 200 mg by mouth 2 (two) times daily.  03/16/17   [provider]  latanoprost (XALATAN) 0.005 % ophthalmic solution Place 1 drop into both eyes at bedtime.     [provider]  levofloxacin (LEVAQUIN) 500 MG tablet Take 500 mg by mouth daily. For 10 days 05/24/19 06/03/19  [provider]  methocarbamol (ROBAXIN) 500 MG tablet Take 500 mg by mouth 4 (four) times daily. 05/04/19   [provider]  Multiple Vitamin (MULTI-VITAMIN) tablet Take 1 tablet by mouth daily.    [provider]  Nintedanib (OFEV) 100 MG CAPS Take 1 capsule (100 mg total) by mouth 2 (two) times daily. Patient not taking: Reported on 05/28/2019 11/26/18   Wilhelmina Mcardle, MD  Omega-3 Fatty Acids (FISH OIL) 1000 MG CAPS Take 1 capsule by mouth 2 (two) times daily.     [provider]  oxyCODONE-acetaminophen (PERCOCET) 7.5-325 MG tablet Take 1 tablet by mouth every 4 (four) hours as needed for severe pain. 04/26/19   Hessie Knows, MD  polyethylene glycol [facility] Floria Raveling) packet Take 17 g by mouth daily.     [provider]  predniSONE (DELTASONE) 5 MG tablet Take 5 mg by mouth 2 (two) times daily with a meal.     [provider]  QUEtiapine (SEROQUEL) 25 MG tablet Take 25 mg by mouth Nightly. 04/07/19   [provider]  zolpidem (AMBIEN) 10 MG tablet Take 10 mg by mouth at bedtime as needed for sleep. 02/24/19   [provider]     Allergies:     Allergies  Allergen Reactions   Remicade [Infliximab] Shortness Of Breath and Itching   Sulfa Antibiotics Other (See Comments)    Other Reaction: tounge cracked   Actonel [Risedronate Sodium] Other (See Comments)   Fosamax [Alendronate Sodium]     GI Bleed    Procardia [Nifedipine] Hives   Sulfur Swelling   Wellbutrin [Bupropion] Anxiety     Physical Exam:   Vitals  Blood pressure (!) 129/57, pulse 87, temperature 98.6 F (37 C), temperature source Oral, resp. rate (!) 24,  SpO2 93 %.   1. General pale, chronically ill-appearing female laying in bed in mild discomfort  2. Normal affect and insight, Not Suicidal or Homicidal, Awake Alert, Oriented X 3.  3. No F.N deficits, ALL C.Nerves Intact, Strength 5/5 all 4 extremities, Sensation intact all 4 extremities,  Plantars down going.  4. Ears and Eyes appear Normal, Conjunctivae clear, PERRLA. Moist Oral Mucosa.  5. Supple Neck, No JVD, No cervical lymphadenopathy appriciated, No Carotid Bruits.  6. Symmetrical Chest wall movement, Good air movement bilaterally, coarse respiratory sounds bilaterally, mildly tachypneic  7. RRR, No Gallops, Rubs or Murmurs, No Parasternal Heave.  8. Positive Bowel Sounds, Abdomen Soft, No tenderness, No organomegaly appriciated,No rebound -guarding or rigidity.  9.  No Cyanosis, Normal Skin Turgor, No Skin Rash or Bruise.  10. Good muscle tone, finger changes related to noted arthritis and scleroderma, no effusions, Normal ROM.  11. No Palpable Lymph Nodes in Neck or Axillae    Data Review:    CBC Recent Labs  Lab 05/27/19 2017  WBC 5.4  HGB 10.9*  HCT 34.6*  PLT 339  MCV 90.1  MCH 28.4  MCHC 31.5  RDW 15.6*  LYMPHSABS 0.8  MONOABS 0.8  EOSABS 0.0  BASOSABS 0.0   ------------------------------------------------------------------------------------------------------------------  Chemistries  Recent Labs  Lab 05/27/19 2017  NA 136  K 3.8  CL 98  CO2 25  GLUCOSE 103*  BUN 13  CREATININE 1.11*  CALCIUM 7.7*   ------------------------------------------------------------------------------------------------------------------ estimated creatinine clearance is 44.6 mL/min (A) (by C-G formula based on SCr of 1.11 mg/dL (H)). ------------------------------------------------------------------------------------------------------------------ No results for input(s): TSH, T4TOTAL, T3FREE, THYROIDAB in the last 72 hours.  Invalid input(s): FREET3  Coagulation profile No results for input(s): INR, PROTIME in the last 168 hours. ------------------------------------------------------------------------------------------------------------------- No results for input(s): DDIMER in the last 72  hours. -------------------------------------------------------------------------------------------------------------------  Cardiac Enzymes Recent Labs  Lab 05/27/19 2017  TROPONINI <0.03   ------------------------------------------------------------------------------------------------------------------    Component Value Date/Time   BNP 84.0 02/26/2018 1313     ---------------------------------------------------------------------------------------------------------------  Urinalysis    Component Value Date/Time   COLORURINE YELLOW (A) 03/17/2018 0700   APPEARANCEUR CLEAR (A) 03/17/2018 0700   LABSPEC 1.008 03/17/2018 0700   PHURINE 6.0 03/17/2018 0700   GLUCOSEU NEGATIVE 03/17/2018 0700   HGBUR NEGATIVE 03/17/2018 0700   BILIRUBINUR NEGATIVE 03/17/2018 0700   KETONESUR NEGATIVE 03/17/2018 0700   PROTEINUR NEGATIVE 03/17/2018 0700   NITRITE NEGATIVE 03/17/2018 0700   LEUKOCYTESUR TRACE (A) 03/17/2018 0700    ----------------------------------------------------------------------------------------------------------------   Imaging Results:    Dg Chest 1 View  Result Date: 05/27/2019 CLINICAL DATA:  Shortness of breath. EXAM: CHEST  1 VIEW COMPARISON:  Radiograph 10/05/2018. High-resolution chest CT 11/02/2018 FINDINGS: Lower lung volumes from prior exam. Chronic interstitial lung disease with interstitial coarsening and bibasilar honeycombing. Findings are grossly similar. Unchanged heart size and mediastinal contours. No acute airspace disease. No large pleural effusion. No pneumothorax. Kyphoplasty visualized in the midthoracic spine. Patient's chin obscures the apices. IMPRESSION: Low lung volumes and chronic interstitial lung disease. No acute abnormalities. Electronically Signed   By: Keith Rake M.D.   On: 05/27/2019 22:37   Ct Angio Chest Pe W And/or Wo Contrast  Result Date: 05/28/2019 CLINICAL DATA:  Chest pain.  Recent fall. EXAM: CT ANGIOGRAPHY CHEST WITH  CONTRAST TECHNIQUE: Multidetector CT imaging of the chest was performed using the standard protocol during bolus administration of intravenous contrast. Multiplanar CT image reconstructions and MIPs were obtained to evaluate the vascular anatomy. CONTRAST:  25mL  OMNIPAQUE IOHEXOL 350 MG/ML SOLN COMPARISON:  Chest radiograph from one day prior. 11/02/2018 high-resolution chest CT. FINDINGS: Cardiovascular: The study is moderate quality for the evaluation of pulmonary embolism, degraded by motion. There are no filling defects in the central, lobar, segmental or subsegmental pulmonary artery branches to suggest acute pulmonary embolism. Normal course and caliber of the thoracic aorta. Dilated main pulmonary artery (3.6 cm diameter), stable. Normal heart size. No significant pericardial fluid/thickening. Left anterior descending coronary atherosclerosis. Mediastinum/Nodes: No discrete thyroid nodules. Patulous debris-filled thoracic esophagus. No axillary adenopathy. Mildly enlarged 1.0 cm left prevascular node (series 5/image 88), new. Newly enlarged 1.3 cm subcarinal node (series 5/image 112). New mild bilateral hilar adenopathy measuring 1.1 cm on the right and 1.2 cm on the left. Lungs/Pleura: No pneumothorax. No significant pleural effusions. There is new patchy consolidation and ground-glass opacity throughout both lungs involving the upper and lower lobes bilaterally, superimposed on a background of patchy confluent peripheral reticulation, ground-glass attenuation, moderate traction bronchiectasis and architectural distortion with a basilar predominance. No discrete lung masses or significant pulmonary nodules. Upper abdomen: Simple 2.2 cm right liver lobe cyst. Granulomatous splenic calcifications. Small hiatal hernia. Musculoskeletal: No aggressive appearing focal osseous lesions. Multiple healed right rib deformities. Severe T7 and T8 vertebral compression fracture status post vertebroplasty with exaggerated  thoracic kyphosis. Moderate thoracic spondylosis. Review of the MIP images confirms the above findings. IMPRESSION: 1. No evidence of pulmonary embolism. 2. Findings are suggestive of acute multilobar atypical pneumonia superimposed on a background of fibrotic interstitial lung disease (as characterized on 11/02/2018 high-resolution chest CT study). There are a spectrum of findings in the lungs which can be seen with acute atypical infection (as well as other non-infectious etiologies). In particular, viral pneumonia (including COVID-19) should be considered in the appropriate clinical setting. 3. New mild mediastinal and bilateral hilar lymphadenopathy, nonspecific, probably reactive. 4. Stable dilated main pulmonary artery, suggesting chronic pulmonary arterial hypertension. 5. Patulous debris-filled thoracic esophagus. Aortic Atherosclerosis (ICD10-I70.0). These results were called by telephone at the time of interpretation on 05/28/2019 at 4:17 am to Dr. Marjean Donna , who verbally acknowledged these results. Electronically Signed   By: Ilona Sorrel M.D.   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 -We will send inflammatory markers including CRP, D-dimers,  ferritin. -Is high risk for decompensation especially with underlying interstitial lung disease -She will be started on IV Remdesivir -We will start on IV Solu-Medrol -Vitamin C and zinc -Encourage her to use incentive spirometry  Recent kyphoplasty of T7/T8 -Consult PT/OT, continue with home medication pain regimen  History scleroderma/rheumatoid arthritis/Raynaud's disease -Continue with home Norvasc, she is and prednisone, which will be substituted with IV steroids during hospital stay,  she is on Plaquenil, which I will hold during hospital stay as it interacts with Remdesivir.  Interstitial lung disease -Cussed with pulmonary, will continue to hold OFEV, is been held as an outpatient, this can be resumed as an outpatient once felt appropriate by her primary pulmonary physician. -Continue with IV steroids  History of [name] -Continue with aspirin  Hyperlipidemia -Continue with home dose antilipid     DVT Prophylaxis  Lovenox- SCDs   AM Labs Ordered, also please review Full Orders  Family Communication: Admission, patients condition and plan of care including tests being ordered have been discussed with the patient  And her cousin (who is her caregiver, patient would like her cousin Ms. Foster to be a point of contact )who indicate understanding and agree with the plan and Code Status.  Code Status DNR /confirmed by patient  Likely DC to  : pending further work up.  Condition GUARDED    Consults called: none   Admission status: inpatient  Time spent in minutes : 60 minutes   Phillips Climes M.D on 05/28/2019 at 4:35 PM  Between 7am to 7pm - Pager - [phone]. After 7pm go to [email or web address] - password [facility]  [facility] - Office  [phone]

## 2019-05-28 NOTE — ED Notes (Signed)
Report given to [facility] [place]

## 2019-05-28 NOTE — ED Notes (Signed)
Pt increased to oxgen at 3lpm via [place] for pox of 92% on 2lpm. Pt with rebound pox to 94% on 3lpm. Pt states she has been increasingly shob over last several days, has chronic back pain that is worsening. Pt states she cannot take care of herself at  Home anymore.

## 2019-05-28 NOTE — ED Notes (Signed)
Patient given update. 

## 2019-05-28 NOTE — ED Notes (Signed)
emtala reviewed by this RN 

## 2019-05-28 NOTE — ED Notes (Signed)
Patient transported to CT 

## 2019-05-28 NOTE — ED Notes (Signed)
Pharmacy to send up zithromax, out in [employer]

## 2019-05-28 NOTE — ED Notes (Signed)
Dorian EDT assisted pt to toilet.

## 2019-05-28 NOTE — ED Notes (Signed)
Attempted to call report- was told nurse Cristie Hem would call me back

## 2019-05-28 NOTE — ED Notes (Signed)
Report to amber,rn. Paige, rn in to check iv site.

## 2019-05-29 DIAGNOSIS — M349 Systemic sclerosis, unspecified: Secondary | ICD-10-CM

## 2019-05-29 LAB — COMPREHENSIVE METABOLIC PANEL
ALT: 24 U/L (ref 0–44)
AST: 42 U/L — ABNORMAL HIGH (ref 15–41)
Albumin: 3 g/dL — ABNORMAL LOW (ref 3.5–5.0)
Alkaline Phosphatase: 77 U/L (ref 38–126)
Anion gap: 13 (ref 5–15)
BUN: 14 mg/dL (ref 8–23)
CO2: 23 mmol/L (ref 22–32)
Calcium: 7.9 mg/dL — ABNORMAL LOW (ref 8.9–10.3)
Chloride: 101 mmol/L (ref 98–111)
Creatinine, Ser: 0.8 mg/dL (ref 0.44–1.00)
GFR calc Af Amer: >60 mL/min (ref >60)
GFR calc non Af Amer: >60 mL/min (ref >60)
Glucose, Bld: 157 mg/dL — ABNORMAL HIGH (ref 70–99)
Potassium: 3.7 mmol/L (ref 3.5–5.1)
Sodium: 137 mmol/L (ref 135–145)
Total Bilirubin: 0.3 mg/dL (ref 0.3–1.2)
Total Protein: 6 g/dL — ABNORMAL LOW (ref 6.5–8.1)

## 2019-05-29 LAB — CBC WITH DIFFERENTIAL/PLATELET
Abs Immature Granulocytes: 0.07 10*3/uL (ref 0.00–0.07)
Basophils Absolute: 0 10*3/uL (ref 0.0–0.1)
Basophils Relative: 0 %
Eosinophils Absolute: 0 10*3/uL (ref 0.0–0.5)
Eosinophils Relative: 0 %
HCT: 37.4 % (ref 36.0–46.0)
Hemoglobin: 11.3 g/dL — ABNORMAL LOW (ref 12.0–15.0)
Immature Granulocytes: 3 %
Lymphocytes Relative: 16 %
Lymphs Abs: 0.4 10*3/uL — ABNORMAL LOW (ref 0.7–4.0)
MCH: 27.8 pg (ref 26.0–34.0)
MCHC: 30.2 g/dL (ref 30.0–36.0)
MCV: 91.9 fL (ref 80.0–100.0)
Monocytes Absolute: 0.1 10*3/uL (ref 0.1–1.0)
Monocytes Relative: 4 %
Neutro Abs: 1.8 10*3/uL (ref 1.7–7.7)
Neutrophils Relative %: 77 %
Platelets: 316 10*3/uL (ref 150–400)
RBC: 4.07 MIL/uL (ref 3.87–5.11)
RDW: 15.5 % (ref 11.5–15.5)
WBC: 2.4 10*3/uL — ABNORMAL LOW (ref 4.0–10.5)
nRBC: 0 % (ref 0.0–0.2)

## 2019-05-29 LAB — FERRITIN: Ferritin: 129 ng/mL (ref 11–307)

## 2019-05-29 LAB — MAGNESIUM: Magnesium: 2.3 mg/dL (ref 1.7–2.4)

## 2019-05-29 LAB — D-DIMER, QUANTITATIVE: D-Dimer, Quant: 6.42 ug/mL-FEU — ABNORMAL HIGH (ref 0.00–0.50)

## 2019-05-29 LAB — C-REACTIVE PROTEIN: CRP: 11 mg/dL — ABNORMAL HIGH (ref <1.0)

## 2019-05-29 LAB — PHOSPHORUS: Phosphorus: 4.5 mg/dL (ref 2.5–4.6)

## 2019-05-29 MED ORDER — ALBUTEROL SULFATE HFA 108 (90 BASE) MCG/ACT IN AERS
2.0000 | INHALATION_SPRAY | RESPIRATORY_TRACT | Status: DC | PRN
  Administered 2019-05-29 – 2019-05-31 (×2): 2 via RESPIRATORY_TRACT
  Filled 2019-05-29: qty 6.7

## 2019-05-29 NOTE — TOC Initial Note (Signed)
Transition of Care [facility]) - Initial/Assessment Note    Patient Details  Name: Erin [name] MRN: [id number] Date of Birth: 04-Feb-1946  Transition of Care [facility]) CM/SW Contact:    Maryclare Labrador, RN Phone Number: 05/29/2019, 3:21 PM  Clinical Narrative:       PTA independent from home, pt has PCP and prescription coverage.  PT eval ordered- pt will need to be assess for transportation needs and if any supervision is required at discharge if [facility] is recommended as pt informed CM that she does not have support as her cousin is now sick as well.              Expected Discharge Plan: Home/Self Care Barriers to Discharge: Other (comment)(COVID positive)   Patient Goals and CMS Choice Patient states their goals for this hospitalization and ongoing recovery are:: Pt informed CM that she doesnt want to leave hosptial until she is feeling better      Expected Discharge Plan and Services Expected Discharge Plan: Home/Self Care       Living arrangements for the past 2 months: Single Family Home                                      Prior Living Arrangements/Services Living arrangements for the past 2 months: Single Family Home Lives with:: Self Patient language and need for interpreter reviewed:: Yes Do you feel safe going back to the place where you live?: Yes        Care giver support system in place?: No (comment)(Per pt she only has her cousin - cousin currently sick and awaiting covid results outpt) Current home services: DME(Cane) Criminal Activity/Legal Involvement Pertinent to Current Situation/Hospitalization: No - Comment as needed  Activities of Daily Living Home Assistive Devices/Equipment: Environmental consultant (specify type), Dentures (specify type), Cane (specify quad or straight)(4 wheel walker, straight cane) ADL Screening (condition at time of admission) Patient's cognitive ability adequate to safely complete daily activities?: Yes Is the patient deaf or have difficulty  hearing?: No Does the patient have difficulty seeing, even when wearing glasses/contacts?: Yes Does the patient have difficulty concentrating, remembering, or making decisions?: No Patient able to express need for assistance with ADLs?: Yes Does the patient have difficulty dressing or bathing?: No Independently performs ADLs?: Yes (appropriate for developmental age) Does the patient have difficulty walking or climbing stairs?: Yes Weakness of Legs: Both Weakness of Arms/Hands: Both  Permission Sought/Granted                  Emotional Assessment   Attitude/Demeanor/Rapport: Gracious Affect (typically observed): Accepting Orientation: : Oriented to Self, Oriented to Place, Oriented to  Time, Oriented to Situation      Admission diagnosis:  COVID 74 FALL Patient Active Problem List   Diagnosis Date Noted  . Acute respiratory disease due to COVID-19 virus 05/28/2019  . Rheumatoid arthritis ([place]) 05/28/2019  . Varicose veins of leg with swelling, bilateral 06/25/2018  . Hypertension 04/27/2018  . Acute diastolic CHF (congestive heart failure) ([place]) 03/18/2018  . Lower extremity edema 03/18/2018  . Food in esophagus causing other injury, initial encounter   . Reflux esophagitis   . Gastric nodule   . Symptomatic anemia 02/28/2018  . S/P hip hemiarthroplasty 02/26/2018  . Hip fracture ([place]) 02/22/2018  . Mixed hyperlipidemia 10/26/2017  . Coronary artery disease of native artery of native heart with stable angina pectoris ([place]) 10/24/2017  .  Vulvar irritation 09/02/2017  . 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 Screening for cardiovascular condition 12/15/2016  . SOB (shortness of breath) 12/15/2016  . Idiopathic interstitial fibrosis of lung syndrome ([place]) 12/15/2016  . Scleroderma ([place]) 12/15/2016  . Raynaud's disease without gangrene 12/15/2016  . Melanoma of skin ([place]) 11/11/2016   . Lesion of labia 11/11/2016   PCP:  Idelle Crouch, MD Pharmacy:   [place], [place] - [address] [place] [address] Phone: [phone] Fax: [phone]  [place], [address] [place] [address] Phone: [phone] Fax: [phone]     Social Determinants of Health (SDOH) Interventions    Readmission Risk Interventions Readmission Risk Prevention Plan 05/29/2019  Transportation Screening Not Complete  Transportation Screening Comment Pt was independent and driving prior to getting sick, when she become sick her cousin provided transportation however cousin is now sick - will need to address closer to [place] or Home Care Consult Patient refused  Medication Review (RN Care Manager) Complete  Some recent data might be hidden

## 2019-05-29 NOTE — Progress Notes (Signed)
PROGRESS NOTE                                                                                                                                                                                                             Patient Demographics:    Erin [name], is a 73 y.o. female, DOB - September 15, 1946, RFF:[id number]  Admit date - 05/28/2019   Admitting Physician Barb Merino, MD  Outpatient Primary MD for the patient is Sparks, [name], MD  LOS - 1   No chief complaint on file.      Brief Narrative    73 y.o. female, with past medical history of rheumatoid arthritis, scleroderma, Raynaud's phenomena, hypertension, [name], hyperlipidemia, tissue lung disease, and kyphoplasty 05/17/2019 at [facility], shortness of breath, she was COVID-19 positive, requiring oxygen at [facility] ED, transferred to [facility] for further management .   Subjective:    Erin [name] today has, No headache, No chest pain, No abdominal pain - No Nausea, or generalized weakness, still reports some dyspnea.  Assessment  & Plan :    Active Problems:   Idiopathic interstitial fibrosis of lung syndrome (HCC)   Scleroderma (HCC)   Raynaud's disease without gangrene   Coronary artery disease of native artery of native heart with stable angina pectoris (HCC)   Mixed hyperlipidemia   Hypertension   Acute respiratory disease due to COVID-19 virus   Rheumatoid arthritis (HCC)   Acute hypoxic respiratory failure due to COVID-19 pneumonia -She remains on 2 L [place]. -Continue to follow inflammatory markers, CRP trending down, ferritin remains within normal limit. - on IV Remdesivir. -Is high risk for decompensation especially with underlying interstitial lung disease - on IV Solu-Medrol -Continue with vitamin C and zinc -Encouraged  to use incentive spirometry, will try her on chair today  Recent kyphoplasty of T7/T8 - PT/OT, continue with home medication pain regimen and muscle  relaxant  History scleroderma/rheumatoid arthritis/Raynaud's disease -Continue with home Norvasc. -Continue with steroids -Plaquenil on hold given she is only severe. -Is a steroid dependent.  Interstitial lung disease -Cussed with pulmonary, will continue to hold OFEV, is been held as an outpatient, this can be resumed as an outpatient once felt appropriate by her primary pulmonary physician. -Continue with IV steroids  History of [name] -Continue with aspirin  Hyperlipidemia -Continue with home dose antilipid  COVID-19 Labs  Recent Labs    05/28/19 1720 05/29/19 0205  DDIMER 5.88* 6.42*  FERRITIN 100 129  CRP 9.0* 11.0*    Lab Results  Component Value Date   SARSCOV2NAA POSITIVE (A) 05/28/2019   [place] NEGATIVE 05/17/2019   SARSCOV2NAA NOT DETECTED 04/22/2019     Code Status : DNR  Family Communication  : D/W patient  Disposition Plan  : Pending PT consult   Barriers For Discharge : remains in remdesivir and IV steroids  Consults  :  None  Procedures  : none  DVT Prophylaxis  :  [place] lovenox  Lab Results  Component Value Date   PLT 316 05/29/2019    Antibiotics  :    Anti-infectives (From admission, onward)   Start     Dose/Rate Route Frequency Ordered Stop   05/29/19 2000  remdesivir 100 mg in sodium chloride 0.9 % 250 mL IVPB     100 mg 500 mL/hr over 30 Minutes Intravenous Every 24 hours 05/28/19 1845 06/02/19 1959   05/28/19 2200  hydroxychloroquine (PLAQUENIL) tablet 200 mg  Status:  Discontinued     200 mg Oral 2 times daily 05/28/19 1626 05/28/19 1657   05/28/19 2000  remdesivir 200 mg in sodium chloride 0.9 % 250 mL IVPB     200 mg 500 mL/hr over 30 Minutes Intravenous Once 05/28/19 1845 05/28/19 2200        Objective:   Vitals:   05/29/19 0735 05/29/19 0737 05/29/19 0750 05/29/19 1127  BP:   (!) 115/53 (!) 134/52  Pulse: 83 84 75 81  Resp: (!) 26 (!) 24 18 16   Temp:   97.7 F (36.5 C) 98 F (36.7 C)  TempSrc:   Oral  Oral  SpO2: (!) 84% 100% 99% 99%    Wt Readings from Last 3 Encounters:  05/27/19 83.9 kg  05/17/19 84.1 kg  04/26/19 84.1 kg     Intake/Output Summary (Last 24 hours) at 05/29/2019 1200 Last data filed at 05/29/2019 0936 Gross per 24 hour  Intake 720 ml  Output 300 ml  Net 420 ml     Physical Exam  Awake Alert, Oriented X 3, No new F.N deficits, Normal affect Symmetrical Chest wall movement, Good air movement bilaterally, Scattered rales RRR,No Gallops,Rubs or new Murmurs, No Parasternal Heave +ve B.Sounds, Abd Soft, No tenderness, No rebound - guarding or rigidity. No Cyanosis, Clubbing or edema, No new Rash or bruise      Data Review:    CBC Recent Labs  Lab 05/27/19 2017 05/28/19 1720 05/29/19 0205  WBC 5.4 4.5 2.4*  HGB 10.9* 10.1* 11.3*  HCT 34.6* 32.7* 37.4  PLT 339 328 316  MCV 90.1 90.8 91.9  MCH 28.4 28.1 27.8  MCHC 31.5 30.9 30.2  RDW 15.6* 15.4 15.5  LYMPHSABS 0.8  --  0.4*  MONOABS 0.8  --  0.1  EOSABS 0.0  --  0.0  BASOSABS 0.0  --  0.0    Chemistries  Recent Labs  Lab 05/27/19 2017 05/28/19 1720 05/29/19 0205  NA 136  --  137  K 3.8  --  3.7  CL 98  --  101  CO2 25  --  23  GLUCOSE 103*  --  157*  BUN 13  --  14  CREATININE 1.11* 0.82 0.80  CALCIUM 7.7*  --  7.9*  MG  --   --  2.3  AST  --  38 42*  ALT  --  21 24  ALKPHOS  --  69 77  BILITOT  --  0.4 0.3   ------------------------------------------------------------------------------------------------------------------ No results for input(s): CHOL, HDL, LDLCALC, TRIG, CHOLHDL, LDLDIRECT in the last 72 hours.  No results found for: HGBA1C ------------------------------------------------------------------------------------------------------------------ No results for input(s): TSH, T4TOTAL, T3FREE, THYROIDAB in the last 72 hours.  Invalid input(s): FREET3 ------------------------------------------------------------------------------------------------------------------ Recent  Labs    05/28/19 1720 05/29/19 0205  FERRITIN 100 129    Coagulation profile No results for input(s): INR, PROTIME in the last 168 hours.  Recent Labs    05/28/19 1720 05/29/19 0205  DDIMER 5.88* 6.42*    Cardiac Enzymes Recent Labs  Lab 05/27/19 2017  TROPONINI <0.03   ------------------------------------------------------------------------------------------------------------------    Component Value Date/Time   BNP 84.0 02/26/2018 1313    Inpatient Medications  Scheduled Meds:  amLODipine  10 mg Oral BID   aspirin EC  81 mg Oral Daily   DULoxetine  60 mg Oral Daily   enoxaparin (LOVENOX) injection  40 mg Subcutaneous Q24H   ezetimibe  10 mg Oral QHS   famotidine  20 mg Oral BID   gabapentin  300 mg Oral BID   latanoprost  1 drop Both Eyes QHS   methylPREDNISolone (SOLU-MEDROL) injection  80 mg Intravenous Q8H   omega-3 acid ethyl esters  1 g Oral BID   pantoprazole  40 mg Oral Daily   polyethylene glycol  17 g Oral Daily   QUEtiapine  25 mg Oral QHS   vitamin C  500 mg Oral Daily   zinc sulfate  220 mg Oral Daily   Continuous Infusions:  remdesivir 100 mg in NS 250 mL     PRN Meds:.ALPRAZolam, benzonatate, chlorpheniramine-HYDROcodone, guaiFENesin-dextromethorphan, methocarbamol, oxyCODONE-acetaminophen **AND** oxyCODONE, zolpidem  Micro Results Recent Results (from the past 240 hour(s))  SARS Coronavirus 2 (CEPHEID- Performed in [place] hospital lab), Hosp Order     Status: Abnormal   Collection Time: 05/28/19  3:28 AM   Specimen: Nasopharyngeal Swab  Result Value Ref Range Status   SARS Coronavirus 2 POSITIVE (A) NEGATIVE Final    Comment: CRITICAL RESULT CALLED TO, READ BACK BY AND VERIFIED WITH: page johnson @0520  05/28/2019 ttg (NOTE) If result is NEGATIVE SARS-CoV-2 target nucleic acids are NOT DETECTED. The SARS-CoV-2 RNA is generally detectable in upper and lower  respiratory specimens during the acute phase of infection.  The lowest  concentration of SARS-CoV-2 viral copies this assay can detect is 250  copies / mL. A negative result does not preclude SARS-CoV-2 infection  and should not be used as the sole basis for treatment or other  patient management decisions.  A negative result may occur with  improper specimen collection / handling, submission of specimen other  than nasopharyngeal swab, presence of viral mutation(s) within the  areas targeted by this assay, and inadequate number of viral copies  (<250 copies / mL). A negative result must be combined with clinical  observations, patient history, and epidemiological information. If result is POSITIVE SARS-CoV-2 target nucleic acids are DETECT ED. The SARS-CoV-2 RNA is generally detectable in upper and lower  respiratory specimens during the acute phase of infection.  Positive  results are indicative of active infection with SARS-CoV-2.  Clinical  correlation with patient history and other diagnostic information is  necessary to determine patient infection status.  Positive results do  not rule out bacterial infection or co-infection with other viruses. If result is PRESUMPTIVE POSTIVE SARS-CoV-2 nucleic acids MAY BE PRESENT.   A presumptive positive result was obtained on  the submitted specimen  and confirmed on repeat testing.  While 2019 novel coronavirus  (SARS-CoV-2) nucleic acids may be present in the submitted sample  additional confirmatory testing may be necessary for epidemiological  and / or clinical management purposes  to differentiate between  SARS-CoV-2 and other Sarbecovirus currently known to infect humans.  If clinically indicated additional testing with an alternate test  methodology (LAB74 53) is advised. The SARS-CoV-2 RNA is generally  detectable in upper and lower respiratory specimens during the acute  phase of infection. The expected result is Negative. Fact Sheet for Patients:   StrictlyIdeas.no Fact Sheet for Healthcare Providers: BankingDealers.co.za This test is not yet approved or cleared by the [place] FDA and has been authorized for detection and/or diagnosis of SARS-CoV-2 by FDA under an Emergency Use Authorization (EUA).  This EUA will remain in effect (meaning this test can be used) for the duration of the COVID-19 declaration under Section 564(b)(1) of the Act, 21 U.S.C. section 360bbb-3(b)(1), unless the authorization is terminated or revoked sooner. Performed at [facility], [address], [place], [place] [address]     Radiology Reports Dg Chest 1 View  Result Date: 05/27/2019 CLINICAL DATA:  Shortness of breath. EXAM: CHEST  1 VIEW COMPARISON:  Radiograph 10/05/2018. High-resolution chest CT 11/02/2018 FINDINGS: Lower lung volumes from prior exam. Chronic interstitial lung disease with interstitial coarsening and bibasilar honeycombing. Findings are grossly similar. Unchanged heart size and mediastinal contours. No acute airspace disease. No large pleural effusion. No pneumothorax. Kyphoplasty visualized in the midthoracic spine. Patient's chin obscures the apices. IMPRESSION: Low lung volumes and chronic interstitial lung disease. No acute abnormalities. Electronically Signed   By: Keith Rake M.D.   On: 05/27/2019 22:37   Dg Thoracic Spine 2 View  Result Date: 05/17/2019 CLINICAL DATA:  Kyphoplasty EXAM: THORACIC SPINE 2 VIEWS; DG C-ARM 61-120 MIN COMPARISON:  05/16/2019 MRI, fluoroscopic images 04/26/2019 FINDINGS: Two low resolution intraoperative spot views of the thoracic spine. Prior vertebral augmentation at T7. Interval augmentation changes at presumed T8 level. Cementing material anterior to T7-T8 disc level. Total fluoroscopy time was 1 minutes 51 seconds IMPRESSION: Intraoperative fluoroscopic assistance provided during thoracic spine kyphoplasty Electronically Signed   By:  Donavan Foil M.D.   On: 05/17/2019 14:42   Ct Angio Chest Pe W And/or Wo Contrast  Result Date: 05/28/2019 CLINICAL DATA:  Chest pain.  Recent fall. EXAM: CT ANGIOGRAPHY CHEST WITH CONTRAST TECHNIQUE: Multidetector CT imaging of the chest was performed using the standard protocol during bolus administration of intravenous contrast. Multiplanar CT image reconstructions and MIPs were obtained to evaluate the vascular anatomy. CONTRAST:  40mL OMNIPAQUE IOHEXOL 350 MG/ML SOLN COMPARISON:  Chest radiograph from one day prior. 11/02/2018 high-resolution chest CT. FINDINGS: Cardiovascular: The study is moderate quality for the evaluation of pulmonary embolism, degraded by motion. There are no filling defects in the central, lobar, segmental or subsegmental pulmonary artery branches to suggest acute pulmonary embolism. Normal course and caliber of the thoracic aorta. Dilated main pulmonary artery (3.6 cm diameter), stable. Normal heart size. No significant pericardial fluid/thickening. Left anterior descending coronary atherosclerosis. Mediastinum/Nodes: No discrete thyroid nodules. Patulous debris-filled thoracic esophagus. No axillary adenopathy. Mildly enlarged 1.0 cm left prevascular node (series 5/image 88), new. Newly enlarged 1.3 cm subcarinal node (series 5/image 112). New mild bilateral hilar adenopathy measuring 1.1 cm on the right and 1.2 cm on the left. Lungs/Pleura: No pneumothorax. No significant pleural effusions. There is new patchy consolidation and ground-glass opacity throughout both  lungs involving the upper and lower lobes bilaterally, superimposed on a background of patchy confluent peripheral reticulation, ground-glass attenuation, moderate traction bronchiectasis and architectural distortion with a basilar predominance. No discrete lung masses or significant pulmonary nodules. Upper abdomen: Simple 2.2 cm right liver lobe cyst. Granulomatous splenic calcifications. Small hiatal hernia.  Musculoskeletal: No aggressive appearing focal osseous lesions. Multiple healed right rib deformities. Severe T7 and T8 vertebral compression fracture status post vertebroplasty with exaggerated thoracic kyphosis. Moderate thoracic spondylosis. Review of the MIP images confirms the above findings. IMPRESSION: 1. No evidence of pulmonary embolism. 2. Findings are suggestive of acute multilobar atypical pneumonia superimposed on a background of fibrotic interstitial lung disease (as characterized on 11/02/2018 high-resolution chest CT study). There are a spectrum of findings in the lungs which can be seen with acute atypical infection (as well as other non-infectious etiologies). In particular, viral pneumonia (including COVID-19) should be considered in the appropriate clinical setting. 3. New mild mediastinal and bilateral hilar lymphadenopathy, nonspecific, probably reactive. 4. Stable dilated main pulmonary artery, suggesting chronic pulmonary arterial hypertension. 5. Patulous debris-filled thoracic esophagus. Aortic Atherosclerosis (ICD10-I70.0). These results were called by telephone at the time of interpretation on 05/28/2019 at 4:17 am to Dr. Marjean Donna , who verbally acknowledged these results. Electronically Signed   By: Ilona Sorrel M.D.   On: 05/28/2019 04:17   Mr Thoracic Spine Wo Contrast  Result Date: 05/16/2019 CLINICAL DATA:  Kyphoplasty 3 weeks ago. Mid thoracic pain radiating bilaterally. EXAM: MRI THORACIC SPINE WITHOUT CONTRAST TECHNIQUE: Multiplanar, multisequence MR imaging of the thoracic spine was performed. No intravenous contrast was administered. COMPARISON:  04/19/2019 FINDINGS: Alignment:  Physiologic. Vertebrae: Again noted is a T7 vertebral body compression fracture with approximately 65% height loss with interval kyphoplasty with methylmethacrylate within the vertebral body without significant change in overall height. Persistent marrow edema throughout the vertebral body and to  lesser extent posterior elements. Interval development of a T8 vertebral body compression fracture with approximately 60% anterior height loss and marrow edema throughout the vertebral body. No discitis or osteomyelitis.  No aggressive osseous lesion. Cord:  Normal signal and morphology. Paraspinal and other soft tissues: No acute paraspinal abnormality. Disc levels: Disc spaces: Degenerative disc disease with mild disc height loss at T4-5, T5-6 and C6-7. T1-T2: No disc protrusion, foraminal stenosis or central canal stenosis. T2-T3: No disc protrusion, foraminal stenosis or central canal stenosis. T3-T4: No disc protrusion, foraminal stenosis or central canal stenosis. T4-T5: Minimal broad-based disc bulge. No foraminal or central canal stenosis. T5-T6: Minimal broad-based disc bulge. No foraminal or central canal stenosis. T6-T7: No disc protrusion, foraminal stenosis or central canal stenosis. T7-T8: No significant disc protrusion. Mild bilateral foraminal narrowing. Mild central canal stenosis. T8-T9: No disc protrusion, foraminal stenosis or central canal stenosis. T9-T10: No disc protrusion, foraminal stenosis or central canal stenosis. T10-T11: Mild broad-based disc bulge. No foraminal or central canal stenosis. T11-T12: No disc protrusion, foraminal stenosis or central canal stenosis. L1-2: Mild broad-based disc bulge. No foraminal or central canal stenosis. Mild bilateral facet arthropathy. L2-3: Broad-based disc bulge eccentric towards the left. Mild left foraminal stenosis. No right foraminal stenosis. No central canal stenosis. IMPRESSION: 1. Acute progressive T8 vertebral body compression fracture with approximately 60% height loss and marrow edema throughout the vertebral body. 2. T7 vertebral body compression fracture with approximately 65% height loss with interval kyphoplasty with methylmethacrylate within the vertebral body without significant change in overall height. Persistent marrow edema  throughout the vertebral body and to lesser extent  posterior elements. Electronically Signed   By: Kathreen Devoid   On: 05/16/2019 08:09   Dg C-arm 1-60 Min  Result Date: 05/17/2019 CLINICAL DATA:  Kyphoplasty EXAM: THORACIC SPINE 2 VIEWS; DG C-ARM 61-120 MIN COMPARISON:  05/16/2019 MRI, fluoroscopic images 04/26/2019 FINDINGS: Two low resolution intraoperative spot views of the thoracic spine. Prior vertebral augmentation at T7. Interval augmentation changes at presumed T8 level. Cementing material anterior to T7-T8 disc level. Total fluoroscopy time was 1 minutes 51 seconds IMPRESSION: Intraoperative fluoroscopic assistance provided during thoracic spine kyphoplasty Electronically Signed   By: Donavan Foil M.D.   On: 05/17/2019 14:42    Time Spent in minutes  25 minutes  Phillips Climes M.D on 05/29/2019 at 12:00 PM  Between 7am to 7pm - Pager - [phone]  After 7pm go to [email or web address] - password [facility]  [facility] -  Office  [phone]

## 2019-05-29 NOTE — Progress Notes (Addendum)
Attempted to call patient's primary contact, Estill Bamberg for update. No answer.   [place] called back. Update provided. Questions encouraged and answered.

## 2019-05-30 LAB — COMPREHENSIVE METABOLIC PANEL
ALT: 23 U/L (ref 0–44)
AST: 36 U/L (ref 15–41)
Albumin: 2.8 g/dL — ABNORMAL LOW (ref 3.5–5.0)
Alkaline Phosphatase: 67 U/L (ref 38–126)
Anion gap: 10 (ref 5–15)
BUN: 20 mg/dL (ref 8–23)
CO2: 26 mmol/L (ref 22–32)
Calcium: 8.1 mg/dL — ABNORMAL LOW (ref 8.9–10.3)
Chloride: 103 mmol/L (ref 98–111)
Creatinine, Ser: 0.79 mg/dL (ref 0.44–1.00)
GFR calc Af Amer: >60 mL/min (ref >60)
GFR calc non Af Amer: >60 mL/min (ref >60)
Glucose, Bld: 162 mg/dL — ABNORMAL HIGH (ref 70–99)
Potassium: 4 mmol/L (ref 3.5–5.1)
Sodium: 139 mmol/L (ref 135–145)
Total Bilirubin: 0.1 mg/dL — ABNORMAL LOW (ref 0.3–1.2)
Total Protein: 5.7 g/dL — ABNORMAL LOW (ref 6.5–8.1)

## 2019-05-30 LAB — CBC WITH DIFFERENTIAL/PLATELET
Abs Immature Granulocytes: 0.17 10*3/uL — ABNORMAL HIGH (ref 0.00–0.07)
Basophils Absolute: 0 10*3/uL (ref 0.0–0.1)
Basophils Relative: 0 %
Eosinophils Absolute: 0 10*3/uL (ref 0.0–0.5)
Eosinophils Relative: 0 %
HCT: 32.8 % — ABNORMAL LOW (ref 36.0–46.0)
Hemoglobin: 10.2 g/dL — ABNORMAL LOW (ref 12.0–15.0)
Immature Granulocytes: 2 %
Lymphocytes Relative: 11 %
Lymphs Abs: 1.1 10*3/uL (ref 0.7–4.0)
MCH: 28.3 pg (ref 26.0–34.0)
MCHC: 31.1 g/dL (ref 30.0–36.0)
MCV: 91.1 fL (ref 80.0–100.0)
Monocytes Absolute: 0.6 10*3/uL (ref 0.1–1.0)
Monocytes Relative: 6 %
Neutro Abs: 8.9 10*3/uL — ABNORMAL HIGH (ref 1.7–7.7)
Neutrophils Relative %: 81 %
Platelets: 318 10*3/uL (ref 150–400)
RBC: 3.6 MIL/uL — ABNORMAL LOW (ref 3.87–5.11)
RDW: 15.4 % (ref 11.5–15.5)
WBC: 10.8 10*3/uL — ABNORMAL HIGH (ref 4.0–10.5)
nRBC: 0 % (ref 0.0–0.2)

## 2019-05-30 LAB — D-DIMER, QUANTITATIVE: D-Dimer, Quant: 5.03 ug/mL-FEU — ABNORMAL HIGH (ref 0.00–0.50)

## 2019-05-30 LAB — MAGNESIUM: Magnesium: 2.4 mg/dL (ref 1.7–2.4)

## 2019-05-30 LAB — FERRITIN: Ferritin: 136 ng/mL (ref 11–307)

## 2019-05-30 LAB — PHOSPHORUS: Phosphorus: 3.9 mg/dL (ref 2.5–4.6)

## 2019-05-30 LAB — C-REACTIVE PROTEIN: CRP: 8 mg/dL — ABNORMAL HIGH (ref <1.0)

## 2019-05-30 MED ORDER — ENOXAPARIN SODIUM 40 MG/0.4ML ~~LOC~~ SOLN
40.0000 mg | Freq: Two times a day (BID) | SUBCUTANEOUS | Status: DC
  Administered 2019-05-30 – 2019-06-02 (×7): 40 mg via SUBCUTANEOUS
  Filled 2019-05-30 (×7): qty 0.4

## 2019-05-30 MED ORDER — ORAL CARE MOUTH RINSE
15.0000 mL | Freq: Two times a day (BID) | OROMUCOSAL | Status: DC
  Administered 2019-05-30 – 2019-06-01 (×6): 15 mL via OROMUCOSAL

## 2019-05-30 NOTE — Evaluation (Signed)
Physical Therapy Evaluation Patient Details Name: Erin [name] MRN: [id number] DOB: 08-Feb-1946 Today's Date: 05/30/2019   History of Present Illness  73 y.o. female, presented to ED 05/28/19 with shortness of breath, she was COVID-19 positive, requiring oxygen at [facility] ED, transferred to [facility] for further management. PMH- rheumatoid arthritis, scleroderma, Raynaud's phenomena, hypertension, [name], hyperlipidemia, tissue lung disease, and kyphoplasty 05/17/2019 for T7/T8 fracture  Clinical Impression   Pt admitted with above diagnosis. Pt currently with functional limitations due to the deficits listed below (see PT Problem List). Patient with incr dyspnea and SaO2 decr to 89% on room air with ambulation x 15 ft. Seated rest and returned to 97%. Returned walking 15 ft with SaO2 down to 84% on room air. Returned to 88% within 30 sec of seated rest and 97% on PT's departure. Patient has recently been limited by back pain and is s/p kyphoplasty x 2 in June 2020. Pt will benefit from skilled PT to increase their independence and safety with mobility to allow discharge to the venue listed below.       Follow Up Recommendations SNF    Equipment Recommendations  None recommended by PT    Recommendations for Other Services       Precautions / Restrictions Precautions Precautions: Fall Precaution Comments: fell one week ago; backwards and head hit wall; other near falls Restrictions Weight Bearing Restrictions: No      Mobility  Bed Mobility Overal bed mobility: Needs Assistance Bed Mobility: Rolling;Sidelying to Sit Rolling: Modified independent (Device/Increase time)(with rail) Sidelying to sit: Min assist;HOB elevated          Transfers Overall transfer level: Needs assistance Equipment used: Rolling walker (2 wheeled) Transfers: Sit to/from Stand Sit to Stand: Min guard         General transfer comment: from EOB vc for safe use of RW; from std toilet with grab  bar  Ambulation/Gait Ambulation/Gait assistance: Min guard Gait Distance (Feet): 15 Feet(x2 (seated rest)) Assistive device: Rolling walker (2 wheeled) Gait Pattern/deviations: Step-through pattern;Decreased stride length;Trunk flexed     General Gait Details: on RA decr to 84% with return to 88% in 30 seconds with seated rest  Stairs            Wheelchair Mobility    Modified Rankin (Stroke Patients Only)       Balance Overall balance assessment: Needs assistance         Standing balance support: Single extremity supported Standing balance-Leahy Scale: Poor Standing balance comment: recent fall backwards                             Pertinent Vitals/Pain Pain Assessment: 0-10 Pain Score: 5  Pain Location: low back Pain Descriptors / Indicators: Constant Pain Intervention(s): Limited activity within patient's tolerance;Monitored during session;Repositioned    Home Living Family/patient expects to be discharged to:: Private residence Living Arrangements: Alone Available Help at Discharge: Family;Available PRN/intermittently(cousin; now ill) Type of Home: House Home Access: Ramped entrance     Home Layout: One level Home Equipment: Walker - 2 wheels;Cane - quad;Tub bench;Grab bars - tub/shower      Prior Function Level of Independence: Needs assistance   Gait / Transfers Assistance Needed: walked inside without device; even after kyphoplasty  ADL's / Homemaking Assistance Needed: cousin drove her to appts; did her grocery shopping  Comments: states she should have used her RW more due to near falls and recent fall  Hand Dominance        Extremity/Trunk Assessment   Upper Extremity Assessment Upper Extremity Assessment: Generalized weakness    Lower Extremity Assessment Lower Extremity Assessment: Generalized weakness    Cervical / Trunk Assessment Cervical / Trunk Assessment: Kyphotic  Communication   Communication: No  difficulties  Cognition Arousal/Alertness: Awake/alert Behavior During Therapy: WFL for tasks assessed/performed Overall Cognitive Status: Within Functional Limits for tasks assessed                                        General Comments General comments (skin integrity, edema, etc.): Pt anxious re: d/c plan; has been to SNF for rehab before and agrees to plan    Exercises     Assessment/Plan    PT Assessment Patient needs continued PT services  PT Problem List Decreased strength;Decreased balance;Decreased mobility;Decreased knowledge of use of DME;Decreased knowledge of precautions;Cardiopulmonary status limiting activity;Obesity;Pain       PT Treatment Interventions DME instruction;Gait training;Functional mobility training;Therapeutic activities;Therapeutic exercise;Balance training;Patient/family education    PT Goals (Current goals can be found in the Care Plan section)  Acute Rehab PT Goals Patient Stated Goal: get stronger for potential return to living alone vs find an assisted living to move into PT Goal Formulation: With patient Time For Goal Achievement: 06/13/19 Potential to Achieve Goals: Good    Frequency Min 2X/week   Barriers to discharge Decreased caregiver support      Co-evaluation               AM-PAC PT "6 Clicks" Mobility  Outcome Measure Help needed turning from your back to your side while in a flat bed without using bedrails?: A Little Help needed moving from lying on your back to sitting on the side of a flat bed without using bedrails?: A Little Help needed moving to and from a bed to a chair (including a wheelchair)?: A Little Help needed standing up from a chair using your arms (e.g., wheelchair or bedside chair)?: A Little Help needed to walk in hospital room?: A Little Help needed climbing 3-5 steps with a railing? : A Lot 6 Click Score: 17    End of Session Equipment Utilized During Treatment: Gait belt Activity  Tolerance: Patient tolerated treatment well Patient left: in chair;with call bell/phone within reach;with chair alarm set Nurse Communication: Mobility status(SaO2 on room air) PT Visit Diagnosis: Difficulty in walking, not elsewhere classified (R26.2);Muscle weakness (generalized) (M62.81)    Time: 1610-9604 PT Time Calculation (min) (ACUTE ONLY): 48 min   Charges:   PT Evaluation $PT Eval Moderate Complexity: 1 Mod PT Treatments $Gait Training: 8-22 mins          [employer], PT 05/30/2019, 10:56 AM

## 2019-05-30 NOTE — Progress Notes (Signed)
Due to her d-dimer, we will increase her lovenox to 40mg  BID - ok per Dr Waldron Labs.  Onnie Boer, PharmD, BCIDP, AAHIVP, CPP Infectious Disease Pharmacist 05/30/2019 10:33 AM

## 2019-05-30 NOTE — Progress Notes (Addendum)
Called pt POA [name] for update per pt request. No answer. Left voicemail requesting callback.

## 2019-05-30 NOTE — NC FL2 (Signed)
[place] LEVEL OF CARE SCREENING TOOL     IDENTIFICATION  Patient Name: Erin [name] Birthdate: 12-23-1945 Sex: female Admission Date (Current Location): 05/28/2019  [facility] and [place] Number:  Engineering geologist and Address:  [facility], [place] [address] [place], [place] [address]      Provider Number: [id number]  Attending Physician Name and Address:  Albertine Patricia, MD  Relative Name and Phone Number:  Ortencia Kick; [phone]    Current Level of Care: Hospital Recommended Level of Care: [place] Prior Approval Number:    Date Approved/Denied:   PASRR Number: [id number]  Discharge Plan: SNF    Current Diagnoses: Patient Active Problem List   Diagnosis Date Noted  . Acute respiratory disease due to COVID-19 virus 05/28/2019  . Rheumatoid arthritis ([place]) 05/28/2019  . Varicose veins of leg with swelling, bilateral 06/25/2018  . Hypertension 04/27/2018  . Acute diastolic CHF (congestive heart failure) ([place]) 03/18/2018  . Lower extremity edema 03/18/2018  . Food in esophagus causing other injury, initial encounter   . Reflux esophagitis   . Gastric nodule   . Symptomatic anemia 02/28/2018  . S/P hip hemiarthroplasty 02/26/2018  . Hip fracture ([place]) 02/22/2018  . Mixed hyperlipidemia 10/26/2017  . Coronary artery disease of native artery of native heart with stable angina pectoris ([place]) 10/24/2017  . Vulvar irritation 09/02/2017  . 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 Screening for cardiovascular condition 12/15/2016  . SOB (shortness of breath) 12/15/2016  . Idiopathic interstitial fibrosis of lung syndrome ([place]) 12/15/2016  . Scleroderma ([place]) 12/15/2016  . Raynaud's disease without gangrene 12/15/2016  . Melanoma of skin ([place]) 11/11/2016  . Lesion of labia 11/11/2016    Orientation RESPIRATION BLADDER  Height & Weight     Self, Time, Place, Situation  O2(2L nasal canula) Continent Weight:   Height:     BEHAVIORAL SYMPTOMS/MOOD NEUROLOGICAL BOWEL NUTRITION STATUS      Continent Diet(see discharge summary)  AMBULATORY STATUS COMMUNICATION OF NEEDS Skin   Extensive Assist Verbally Surgical wounds, Skin abrasions, Other (Comment)(incision on back with adhesive bandage; generalized rash on back and arms; MASD on breasts; generalized ecchymosis; abrasion on toe, finger and head)                       Personal Care Assistance Level of Assistance  Bathing, Dressing, Feeding Bathing Assistance: Maximum assistance Feeding assistance: Independent Dressing Assistance: Maximum assistance     Functional Limitations Info  Sight, Speech, Hearing Sight Info: Impaired Hearing Info: Adequate Speech Info: Adequate    SPECIAL CARE FACTORS FREQUENCY  OT (By licensed OT), PT (By licensed PT)     PT Frequency: 5x week OT Frequency: 5x week            Contractures Contractures Info: Not present    Additional Factors Info  Code Status, Allergies, Isolation Precautions; Psychotropic  Code Status Info: DNR Allergies Info: Sulfa Antibiotics; Remicade; Actonel; Fosamax; Procardia; Sulfur; Wellbutrin  Psychotropic Info: Cymbalta 60mg  PO daily; Seroquel 25 mg PO at bedtime   Isolation Precautions Info: airborne/droplet     Current Medications (05/30/2019):  This is the current hospital active medication list Current Facility-Administered Medications  Medication Dose Route Frequency Provider Last Rate Last Dose  . albuterol (VENTOLIN HFA) 108 (90 Base) MCG/ACT inhaler 2 puff  2 puff Inhalation Q4H PRN Barb Merino, MD  2 puff at 05/29/19 2147  . ALPRAZolam Duanne Moron) tablet 0.5 mg  0.5 mg Oral QID PRN Elgergawy, Silver Huguenin, MD   0.5 mg at 05/29/19 2128  . amLODipine (NORVASC) tablet 10 mg  10 mg Oral BID Elgergawy, Silver Huguenin, MD   10 mg at 05/30/19 0919  . aspirin EC tablet 81 mg  81 mg Oral  Daily Elgergawy, Silver Huguenin, MD   81 mg at 05/30/19 0919  . benzonatate (TESSALON) capsule 200 mg  200 mg Oral TID PRN Elgergawy, Silver Huguenin, MD   200 mg at 05/29/19 2128  . chlorpheniramine-HYDROcodone (TUSSIONEX) 10-8 MG/5ML suspension 5 mL  5 mL Oral Q12H PRN Elgergawy, Silver Huguenin, MD      . DULoxetine (CYMBALTA) DR capsule 60 mg  60 mg Oral Daily Elgergawy, Silver Huguenin, MD   60 mg at 05/30/19 0919  . enoxaparin (LOVENOX) injection 40 mg  40 mg Subcutaneous BID Elgergawy, Silver Huguenin, MD   40 mg at 05/30/19 1151  . ezetimibe (ZETIA) tablet 10 mg  10 mg Oral QHS Elgergawy, Silver Huguenin, MD   10 mg at 05/29/19 2106  . famotidine (PEPCID) tablet 20 mg  20 mg Oral BID Elgergawy, Silver Huguenin, MD   20 mg at 05/30/19 0920  . gabapentin (NEURONTIN) capsule 300 mg  300 mg Oral BID Elgergawy, Silver Huguenin, MD   300 mg at 05/30/19 0920  . guaiFENesin-dextromethorphan (ROBITUSSIN DM) 100-10 MG/5ML syrup 10 mL  10 mL Oral Q4H PRN Elgergawy, Silver Huguenin, MD      . latanoprost (XALATAN) 0.005 % ophthalmic solution 1 drop  1 drop Both Eyes QHS Elgergawy, Silver Huguenin, MD   1 drop at 05/29/19 2107  . MEDLINE mouth rinse  15 mL Mouth Rinse BID Elgergawy, Silver Huguenin, MD   15 mL at 05/30/19 1718  . methocarbamol (ROBAXIN) tablet 500 mg  500 mg Oral Q8H PRN Elgergawy, Silver Huguenin, MD      . methylPREDNISolone sodium succinate (SOLU-MEDROL) 125 mg/2 mL injection 80 mg  80 mg Intravenous Q8H Elgergawy, Silver Huguenin, MD   80 mg at 05/30/19 1719  . omega-3 acid ethyl esters (LOVAZA) capsule 1 g  1 g Oral BID Elgergawy, Silver Huguenin, MD   1 g at 05/30/19 0919  . oxyCODONE-acetaminophen (PERCOCET/ROXICET) 5-325 MG per tablet 1 tablet  1 tablet Oral Q6H PRN Elgergawy, Silver Huguenin, MD   1 tablet at 05/30/19 1529   And  . oxyCODONE (Oxy IR/ROXICODONE) immediate release tablet 2.5 mg  2.5 mg Oral Q6H PRN Elgergawy, Silver Huguenin, MD   2.5 mg at 05/30/19 1529  . pantoprazole (PROTONIX) EC tablet 40 mg  40 mg Oral Daily Elgergawy, Silver Huguenin, MD   40 mg at 05/30/19 0920  .  polyethylene glycol (MIRALAX / GLYCOLAX) packet 17 g  17 g Oral Daily Elgergawy, Silver Huguenin, MD   17 g at 05/29/19 0830  . QUEtiapine (SEROQUEL) tablet 25 mg  25 mg Oral QHS Elgergawy, Silver Huguenin, MD   25 mg at 05/29/19 2106  . remdesivir 100 mg in sodium chloride 0.9 % 250 mL IVPB  100 mg Intravenous Q24H Elgergawy, Silver Huguenin, MD 500 mL/hr at 05/29/19 2110 100 mg at 05/29/19 2110  . vitamin C (ASCORBIC ACID) tablet 500 mg  500 mg Oral Daily Elgergawy, Silver Huguenin, MD   500 mg at 05/30/19 0919  . zinc sulfate capsule 220 mg  220 mg Oral Daily Elgergawy, Silver Huguenin, MD   220 mg at 05/30/19 0920  .  zolpidem (AMBIEN) tablet 5 mg  5 mg Oral QHS PRN Elgergawy, Silver Huguenin, MD         Discharge Medications: Please see discharge summary for a list of discharge medications.  Relevant Imaging Results:  Relevant Lab Results:   Additional Information SS[id number] [place], [place]

## 2019-05-30 NOTE — Evaluation (Signed)
Occupational Therapy Evaluation Patient Details Name: Erin [name] MRN: [id number] DOB: 04/11/1946 Today's Date: 05/30/2019    History of Present Illness 73 y.o. female, presented to ED 05/28/19 with shortness of breath, she was COVID-19 positive, requiring oxygen at [facility] ED, transferred to [facility] for further management. PMH- rheumatoid arthritis, scleroderma, Raynaud's phenomena, hypertension, [name], hyperlipidemia, tissue lung disease, and kyphoplasty 05/17/2019 for T7/T8 fracture   Clinical Impression   PTA, pt was living alone and performing ADLs and light IADLs; reports she has had extreme difficulty with IADLs including cooking and home management ("I can't stand long enough to make a sandwich"). Pt currently requiring Mod A for LB ADLs and Min Guard A for functional mobility with RW. Pt present with significant back pain and poor activity tolerance impacting her safety and functional performance. Pt would benefit from further acute OT to facilitate safe dc. Recommend dc to SNF for further OT to optimize safety, independence with ADLs, and return to PLOF.      Follow Up Recommendations  SNF    Equipment Recommendations  Other (comment)(Defer to next venue)    Recommendations for Other Services PT consult     Precautions / Restrictions Precautions Precautions: Fall Precaution Comments: fell one week ago; backwards and head hit wall; other near falls Restrictions Weight Bearing Restrictions: No      Mobility Bed Mobility Overal bed mobility: Needs Assistance Bed Mobility: Rolling;Sidelying to Sit;Sit to Sidelying Rolling: Modified independent (Device/Increase time)(with rail) Sidelying to sit: Min assist;HOB elevated     Sit to sidelying: Min assist General bed mobility comments: Educating pt on log roll technique for back pain. Pt requiring assistance to elevate trunk and to bring BLEs over EOB.  Transfers Overall transfer level: Needs assistance Equipment used: Rolling  walker (2 wheeled) Transfers: Sit to/from Stand Sit to Stand: Min guard         General transfer comment: from EOB vc for safe use of RW; from std toilet with grab bar    Balance Overall balance assessment: Needs assistance Sitting-balance support: No upper extremity supported;Feet supported Sitting balance-Leahy Scale: Fair     Standing balance support: Single extremity supported Standing balance-Leahy Scale: Poor Standing balance comment: recent fall backwards                           ADL either performed or assessed with clinical judgement   ADL Overall ADL's : Needs assistance/impaired Eating/Feeding: Set up;Sitting   Grooming: Oral care;Set up;Supervision/safety;Min guard;Wash/dry face;Sitting;Standing Grooming Details (indicate cue type and reason): Pt performing hand hygiene at sink with supervision. Requiring to sit on BSC at sink due to significant back pain. Pt performing oral care while seated. Upper Body Bathing: Min guard;Sitting   Lower Body Bathing: Moderate assistance;Sit to/from stand   Upper Body Dressing : Min guard;Sitting   Lower Body Dressing: Moderate assistance;Sit to/from stand   Toilet Transfer: Nature conservation officer;Ambulation;RW   Toileting- Clothing Manipulation and Hygiene: Supervision/safety;Sitting/lateral lean       Functional mobility during ADLs: Min guard;Rolling walker General ADL Comments: Pt presenting with significant back pain, poor balance, and decreased activity tolerance. Pt with SOB and SpO2 dropping to 92% on RA; requiring cues for purse lip breathing.      Vision         Perception     Praxis      Pertinent Vitals/Pain Pain Assessment: Faces Faces Pain Scale: Hurts even more Pain Location: low back Pain Descriptors /  Indicators: Constant Pain Intervention(s): Monitored during session;Limited activity within patient's tolerance;Repositioned     Hand Dominance Right   Extremity/Trunk Assessment  Upper Extremity Assessment Upper Extremity Assessment: Generalized weakness   Lower Extremity Assessment Lower Extremity Assessment: Generalized weakness   Cervical / Trunk Assessment Cervical / Trunk Assessment: Kyphotic   Communication Communication Communication: No difficulties   Cognition Arousal/Alertness: Awake/alert Behavior During Therapy: WFL for tasks assessed/performed Overall Cognitive Status: Within Functional Limits for tasks assessed                                     General Comments  Pt reporting she is afraid to return home alone because of falls and SOB.     Exercises     Shoulder Instructions      Home Living Family/patient expects to be discharged to:: Private residence Living Arrangements: Alone Available Help at Discharge: Family;Available PRN/intermittently(cousin; now ill) Type of Home: House Home Access: Ramped entrance     Home Layout: One level     Bathroom Shower/Tub: Teacher, early years/pre: Standard Bathroom Accessibility: No   Home Equipment: Environmental consultant - 2 wheels;Cane - quad;Tub bench;Grab bars - tub/shower          Prior Functioning/Environment Level of Independence: Needs assistance  Gait / Transfers Assistance Needed: walked inside without device; even after kyphoplasty ADL's / Homemaking Assistance Needed: cousin drove her to appts; did her grocery shopping   Comments: states she should have used her RW more due to near falls and recent fall        OT Problem List: Decreased strength;Decreased range of motion;Decreased activity tolerance;Impaired balance (sitting and/or standing);Decreased safety awareness;Decreased knowledge of use of DME or AE;Decreased knowledge of precautions;Pain      OT Treatment/Interventions: Self-care/ADL training;Therapeutic exercise;Energy conservation;DME and/or AE instruction;Therapeutic activities;Patient/family education    OT Goals(Current goals can be found in the  care plan section) Acute Rehab OT Goals Patient Stated Goal: get stronger for potential return to living alone vs find an assisted living to move into OT Goal Formulation: With patient Time For Goal Achievement: 06/13/19 Potential to Achieve Goals: Good  OT Frequency: Min 3X/week   Barriers to D/C:            Co-evaluation              AM-PAC OT "6 Clicks" Daily Activity     Outcome Measure Help from another person eating meals?: None Help from another person taking care of personal grooming?: A Little Help from another person toileting, which includes using toliet, bedpan, or urinal?: A Little Help from another person bathing (including washing, rinsing, drying)?: A Lot Help from another person to put on and taking off regular upper body clothing?: A Little Help from another person to put on and taking off regular lower body clothing?: A Lot 6 Click Score: 17   End of Session Equipment Utilized During Treatment: Rolling walker Nurse Communication: Mobility status;Patient requests pain meds  Activity Tolerance: Patient tolerated treatment well Patient left: in bed;with call bell/phone within reach  OT Visit Diagnosis: Unsteadiness on feet (R26.81);Other abnormalities of gait and mobility (R26.89);Muscle weakness (generalized) (M62.81);Pain Pain - part of body: (Back)                Time: 9622-2979 OT Time Calculation (min): 43 min Charges:  OT General Charges $OT Visit: 1 Visit OT Evaluation $OT Eval Moderate Complexity: 1  Mod OT Treatments $Self Care/Home Management : 23-37 mins  [place], OTR/L Acute Rehab Pager: [phone] Office: [place] 05/30/2019, 3:33 PM

## 2019-05-30 NOTE — Progress Notes (Signed)
PROGRESS NOTE                                                                                                                                                                                                             Patient Demographics:    Erin [name], is a 73 y.o. female, DOB - 1946/06/13, WUJ:[id number]  Admit date - 05/28/2019   Admitting Physician Barb Merino, MD  Outpatient Primary MD for the patient is Sparks, [name], MD  LOS - 2   No chief complaint on file.      Brief Narrative    73 y.o. female, with past medical history of rheumatoid arthritis, scleroderma, Raynaud's phenomena, hypertension, [name], hyperlipidemia, tissue lung disease, and kyphoplasty 05/17/2019 at [facility], shortness of breath, she was COVID-19 positive, requiring oxygen at [facility] ED, transferred to [facility] for further management .   Subjective:    [name] today has, No headache, No chest pain, No abdominal pain - No Nausea, or generalized weakness, still reports some dyspnea.  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 -Continue to follow inflammatory markers, CRP trending down, ferritin remains within normal limit, dimer is trending down as well. - on IV Remdesivir. - She is high risk for decompensation especially with underlying interstitial lung disease - on IV Solu-Medrol, will start taper -Continue with vitamin C and zinc -Encouraged  to use incentive spirometry, ambulated with PT  today -D-dimers more than 5, will change Lovenox to twice daily per  DVT prophylaxis  COVID-19 protocol.   Recent kyphoplasty of T7/T8 -OT consulted, will need SNF placement  History scleroderma/rheumatoid arthritis/Raynaud's disease -Continue with home Norvasc. -Continue with steroids -Plaquenil on hold given she is on Remdesivir. -Is a steroid dependent.  Interstitial lung disease -Disussed with pulmonary, will continue to hold OFEV, is been held as an outpatient, this can be  resumed as an outpatient once felt appropriate by her primary pulmonary physician. -Continue with IV steroids  History of [name] -Continue with aspirin  Hyperlipidemia -Continue with home dose antilipid   COVID-19 Labs  Recent Labs    05/28/19 1720 05/29/19 0205 05/30/19 0352  DDIMER 5.88* 6.42* 5.03*  FERRITIN 100 129 136  CRP 9.0* 11.0* 8.0*    Lab Results  Component Value Date   SARSCOV2NAA POSITIVE (A) 05/28/2019   [place] NEGATIVE 05/17/2019   [place] NOT DETECTED 04/22/2019     Code Status : DNR  Family Communication  : D/W patient  Disposition Plan  : Need SNF placement, SW consulted  Barriers For Discharge : remains in remdesivir and IV steroids  Consults  :  None  Procedures  : none  DVT Prophylaxis  :  [place] lovenox  Lab Results  Component Value Date   PLT 318 05/30/2019    Antibiotics  :    Anti-infectives (From admission, onward)   Start     Dose/Rate Route Frequency Ordered Stop   05/29/19 2000  remdesivir 100 mg in sodium chloride 0.9 % 250 mL IVPB     100 mg 500 mL/hr over 30 Minutes Intravenous Every 24 hours 05/28/19 1845 06/02/19 1959   05/28/19 2200  hydroxychloroquine (PLAQUENIL) tablet 200 mg  Status:  Discontinued     200 mg Oral 2 times daily 05/28/19 1626 05/28/19 1657   05/28/19 2000  remdesivir 200 mg in sodium chloride 0.9 % 250 mL IVPB     200 mg 500 mL/hr over 30 Minutes Intravenous Once 05/28/19 1845 05/28/19 2200         Objective:   Vitals:   05/29/19 1936 05/29/19 2331 05/30/19 0345 05/30/19 0853  BP: 136/62 (!) 118/49 (!) 120/59 132/60  Pulse: 80 77 65 81  Resp: (!) 21     Temp: 99.1 F (37.3 C) 98.5 F (36.9 C) 97.7 F (36.5 C) 98 F (36.7 C)  TempSrc: Axillary Oral Oral Oral  SpO2: 99% 96% 99% 96%    Wt Readings from Last 3 Encounters:  05/27/19 83.9 kg  05/17/19 84.1 kg  04/26/19 84.1 kg     Intake/Output Summary (Last 24 hours) at 05/30/2019 1141 Last data filed at 05/29/2019 1851 Gross per 24 hour  Intake 600 ml  Output --  Net 600 ml     Physical Exam  Awake Alert, Oriented X 3, No new F.N deficits, Normal affect Symmetrical Chest wall movement, Good air movement bilaterally, CTAB RRR,No Gallops,Rubs or new Murmurs, No Parasternal Heave +ve B.Sounds, Abd Soft, No tenderness, No rebound - guarding or rigidity. No Cyanosis, Clubbing or edema, No new Rash or bruise       Data Review:    CBC Recent Labs  Lab 05/27/19 2017 05/28/19 1720 05/29/19 0205 05/30/19 0352  WBC 5.4 4.5 2.4* 10.8*  HGB 10.9* 10.1* 11.3* 10.2*  HCT 34.6* 32.7* 37.4 32.8*  PLT 339 328 316 318  MCV 90.1 90.8 91.9 91.1  MCH 28.4 28.1 27.8 28.3  MCHC 31.5 30.9 30.2 31.1  RDW 15.6* 15.4 15.5 15.4  LYMPHSABS 0.8  --  0.4* 1.1  MONOABS 0.8  --  0.1 0.6  EOSABS 0.0  --  0.0 0.0  BASOSABS 0.0  --  0.0 0.0    Chemistries  Recent Labs  Lab 05/27/19 2017 05/28/19 1720 05/29/19 0205 05/30/19 0352  NA 136  --  137 139  K 3.8  --  3.7 4.0  CL 98  --  101 103  CO2 25  --  23 26  GLUCOSE 103*  --  157* 162*  BUN 13  --  14 20  CREATININE 1.11* 0.82 0.80 0.79  CALCIUM 7.7*  --  7.9* 8.1*  MG  --   --  2.3 2.4  AST  --  38 42* 36  ALT  --  21 24 23   ALKPHOS  --  69 77 67  BILITOT  --  0.4 0.3 0.1*   ------------------------------------------------------------------------------------------------------------------ No results for input(s): CHOL, HDL, LDLCALC, TRIG, CHOLHDL, LDLDIRECT in the  last 72 hours.  No results found for: HGBA1C ------------------------------------------------------------------------------------------------------------------ No results for input(s): TSH, T4TOTAL, T3FREE, THYROIDAB in the last 72 hours.  Invalid input(s): FREET3 ------------------------------------------------------------------------------------------------------------------ Recent Labs    05/29/19 0205 05/30/19 0352  FERRITIN 129 136    Coagulation profile No results for input(s): INR, PROTIME in the last 168 hours.  Recent Labs    05/29/19 0205 05/30/19 0352  DDIMER 6.42* 5.03*    Cardiac Enzymes Recent Labs  Lab 05/27/19 2017  TROPONINI <0.03   ------------------------------------------------------------------------------------------------------------------    Component Value Date/Time   BNP 84.0 02/26/2018 1313    Inpatient Medications  Scheduled Meds:  amLODipine  10 mg Oral BID   aspirin EC  81 mg Oral Daily   DULoxetine  60 mg Oral Daily   enoxaparin (LOVENOX) injection  40 mg Subcutaneous BID   ezetimibe  10 mg Oral QHS   famotidine  20 mg Oral BID   gabapentin  300 mg Oral BID   latanoprost  1 drop Both Eyes QHS   mouth rinse  15 mL Mouth Rinse BID   methylPREDNISolone (SOLU-MEDROL) injection  80 mg Intravenous Q8H   omega-3 acid ethyl esters  1 g Oral BID   pantoprazole  40 mg Oral Daily   polyethylene glycol  17 g Oral Daily   QUEtiapine  25 mg Oral QHS   vitamin C  500 mg Oral Daily   zinc sulfate  220 mg Oral Daily   Continuous Infusions:  remdesivir 100 mg in NS 250 mL 100 mg (05/29/19 2110)   PRN Meds:.albuterol, ALPRAZolam, benzonatate, chlorpheniramine-HYDROcodone, guaiFENesin-dextromethorphan, methocarbamol, oxyCODONE-acetaminophen **AND** oxyCODONE, zolpidem  Micro Results Recent Results (from the past 240 hour(s))  SARS Coronavirus 2 (CEPHEID- Performed in [facility] hospital lab), Hosp Order     Status: Abnormal    Collection Time: 05/28/19  3:28 AM   Specimen: Nasopharyngeal Swab  Result Value Ref Range Status   SARS Coronavirus 2 POSITIVE (A) NEGATIVE Final    Comment: CRITICAL RESULT CALLED TO, READ BACK BY AND VERIFIED WITH: page johnson @0520  05/28/2019 ttg (NOTE) If result is NEGATIVE SARS-CoV-2 target nucleic acids are NOT DETECTED. The SARS-CoV-2 RNA is generally detectable in upper and lower  respiratory specimens during the acute phase of infection. The lowest  concentration of SARS-CoV-2 viral copies this assay can detect is 250  copies / mL. A negative result does not preclude SARS-CoV-2 infection  and should not be used as the sole basis for treatment or other  patient management decisions.  A negative result may occur with  improper specimen collection / handling, submission of specimen other  than nasopharyngeal swab, presence of viral mutation(s) within the  areas targeted by this assay, and inadequate number of viral copies  (<250 copies / mL). A negative result must be combined with clinical  observations, patient history, and epidemiological information. If result is POSITIVE SARS-CoV-2 target nucleic acids are DETECT ED. The SARS-CoV-2  RNA is generally detectable in upper and lower  respiratory specimens during the acute phase of infection.  Positive  results are indicative of active infection with SARS-CoV-2.  Clinical  correlation with patient history and other diagnostic information is  necessary to determine patient infection status.  Positive results do  not rule out bacterial infection or co-infection with other viruses. If result is PRESUMPTIVE POSTIVE SARS-CoV-2 nucleic acids MAY BE PRESENT.   A presumptive positive result was obtained on the submitted specimen  and confirmed on repeat testing.  While 2019 novel coronavirus  (SARS-CoV-2) nucleic acids may be present in the submitted sample  additional confirmatory testing may be necessary for epidemiological  and  / or clinical management purposes  to differentiate between  SARS-CoV-2 and other Sarbecovirus currently known to infect humans.  If clinically indicated additional testing with an alternate test  methodology (LAB74 53) is advised. The SARS-CoV-2 RNA is generally  detectable in upper and lower respiratory specimens during the acute  phase of infection. The expected result is Negative. Fact Sheet for Patients:  StrictlyIdeas.no Fact Sheet for Healthcare Providers: BankingDealers.co.za This test is not yet approved or cleared by the [place] FDA and has been authorized for detection and/or diagnosis of SARS-CoV-2 by FDA under an Emergency Use Authorization (EUA).  This EUA will remain in effect (meaning this test can be used) for the duration of the COVID-19 declaration under Section 564(b)(1) of the Act, 21 U.S.C. section 360bbb-3(b)(1), unless the authorization is terminated or revoked sooner. Performed at [facility], [address], [place], [place] [address]     Radiology Reports Dg Chest 1 View  Result Date: 05/27/2019 CLINICAL DATA:  Shortness of breath. EXAM: CHEST  1 VIEW COMPARISON:  Radiograph 10/05/2018. High-resolution chest CT 11/02/2018 FINDINGS: Lower lung volumes from prior exam. Chronic interstitial lung disease with interstitial coarsening and bibasilar honeycombing. Findings are grossly similar. Unchanged heart size and mediastinal contours. No acute airspace disease. No large pleural effusion. No pneumothorax. Kyphoplasty visualized in the midthoracic spine. Patient's chin obscures the apices. IMPRESSION: Low lung volumes and chronic interstitial lung disease. No acute abnormalities. Electronically Signed   By: Keith Rake M.D.   On: 05/27/2019 22:37   Dg Thoracic Spine 2 View  Result Date: 05/17/2019 CLINICAL DATA:  Kyphoplasty EXAM: THORACIC SPINE 2 VIEWS; DG C-ARM 61-120 MIN COMPARISON:  05/16/2019  MRI, fluoroscopic images 04/26/2019 FINDINGS: Two low resolution intraoperative spot views of the thoracic spine. Prior vertebral augmentation at T7. Interval augmentation changes at presumed T8 level. Cementing material anterior to T7-T8 disc level. Total fluoroscopy time was 1 minutes 51 seconds IMPRESSION: Intraoperative fluoroscopic assistance provided during thoracic spine kyphoplasty Electronically Signed   By: Donavan Foil M.D.   On: 05/17/2019 14:42   Ct Angio Chest Pe W And/or Wo Contrast  Result Date: 05/28/2019 CLINICAL DATA:  Chest pain.  Recent fall. EXAM: CT ANGIOGRAPHY CHEST WITH CONTRAST TECHNIQUE: Multidetector CT imaging of the chest was performed using the standard protocol during bolus administration of intravenous contrast. Multiplanar CT image reconstructions and MIPs were obtained to evaluate the vascular anatomy. CONTRAST:  105mL OMNIPAQUE IOHEXOL 350 MG/ML SOLN COMPARISON:  Chest radiograph from one day prior. 11/02/2018 high-resolution chest CT. FINDINGS: Cardiovascular: The study is moderate quality for the evaluation of pulmonary embolism, degraded by motion. There are no filling defects in the central, lobar, segmental or subsegmental pulmonary artery branches to suggest acute pulmonary embolism. Normal course and caliber of the thoracic aorta. Dilated main pulmonary artery (  3.6 cm diameter), stable. Normal heart size. No significant pericardial fluid/thickening. Left anterior descending coronary atherosclerosis. Mediastinum/Nodes: No discrete thyroid nodules. Patulous debris-filled thoracic esophagus. No axillary adenopathy. Mildly enlarged 1.0 cm left prevascular node (series 5/image 88), new. Newly enlarged 1.3 cm subcarinal node (series 5/image 112). New mild bilateral hilar adenopathy measuring 1.1 cm on the right and 1.2 cm on the left. Lungs/Pleura: No pneumothorax. No significant pleural effusions. There is new patchy consolidation and ground-glass opacity throughout both  lungs involving the upper and lower lobes bilaterally, superimposed on a background of patchy confluent peripheral reticulation, ground-glass attenuation, moderate traction bronchiectasis and architectural distortion with a basilar predominance. No discrete lung masses or significant pulmonary nodules. Upper abdomen: Simple 2.2 cm right liver lobe cyst. Granulomatous splenic calcifications. Small hiatal hernia. Musculoskeletal: No aggressive appearing focal osseous lesions. Multiple healed right rib deformities. Severe T7 and T8 vertebral compression fracture status post vertebroplasty with exaggerated thoracic kyphosis. Moderate thoracic spondylosis. Review of the MIP images confirms the above findings. IMPRESSION: 1. No evidence of pulmonary embolism. 2. Findings are suggestive of acute multilobar atypical pneumonia superimposed on a background of fibrotic interstitial lung disease (as characterized on 11/02/2018 high-resolution chest CT study). There are a spectrum of findings in the lungs which can be seen with acute atypical infection (as well as other non-infectious etiologies). In particular, viral pneumonia (including COVID-19) should be considered in the appropriate clinical setting. 3. New mild mediastinal and bilateral hilar lymphadenopathy, nonspecific, probably reactive. 4. Stable dilated main pulmonary artery, suggesting chronic pulmonary arterial hypertension. 5. Patulous debris-filled thoracic esophagus. Aortic Atherosclerosis (ICD10-I70.0). These results were called by telephone at the time of interpretation on 05/28/2019 at 4:17 am to Dr. Marjean Donna , who verbally acknowledged these results. Electronically Signed   By: Ilona Sorrel M.D.   On: 05/28/2019 04:17   Mr Thoracic Spine Wo Contrast  Result Date: 05/16/2019 CLINICAL DATA:  Kyphoplasty 3 weeks ago. Mid thoracic pain radiating bilaterally. EXAM: MRI THORACIC SPINE WITHOUT CONTRAST TECHNIQUE: Multiplanar, multisequence MR imaging of the  thoracic spine was performed. No intravenous contrast was administered. COMPARISON:  04/19/2019 FINDINGS: Alignment:  Physiologic. Vertebrae: Again noted is a T7 vertebral body compression fracture with approximately 65% height loss with interval kyphoplasty with methylmethacrylate within the vertebral body without significant change in overall height. Persistent marrow edema throughout the vertebral body and to lesser extent posterior elements. Interval development of a T8 vertebral body compression fracture with approximately 60% anterior height loss and marrow edema throughout the vertebral body. No discitis or osteomyelitis.  No aggressive osseous lesion. Cord:  Normal signal and morphology. Paraspinal and other soft tissues: No acute paraspinal abnormality. Disc levels: Disc spaces: Degenerative disc disease with mild disc height loss at T4-5, T5-6 and C6-7. T1-T2: No disc protrusion, foraminal stenosis or central canal stenosis. T2-T3: No disc protrusion, foraminal stenosis or central canal stenosis. T3-T4: No disc protrusion, foraminal stenosis or central canal stenosis. T4-T5: Minimal broad-based disc bulge. No foraminal or central canal stenosis. T5-T6: Minimal broad-based disc bulge. No foraminal or central canal stenosis. T6-T7: No disc protrusion, foraminal stenosis or central canal stenosis. T7-T8: No significant disc protrusion. Mild bilateral foraminal narrowing. Mild central canal stenosis. T8-T9: No disc protrusion, foraminal stenosis or central canal stenosis. T9-T10: No disc protrusion, foraminal stenosis or central canal stenosis. T10-T11: Mild broad-based disc bulge. No foraminal or central canal stenosis. T11-T12: No disc protrusion, foraminal stenosis or central canal stenosis. L1-2: Mild broad-based disc bulge. No foraminal or central canal stenosis. Mild  bilateral facet arthropathy. L2-3: Broad-based disc bulge eccentric towards the left. Mild left foraminal stenosis. No right foraminal  stenosis. No central canal stenosis. IMPRESSION: 1. Acute progressive T8 vertebral body compression fracture with approximately 60% height loss and marrow edema throughout the vertebral body. 2. T7 vertebral body compression fracture with approximately 65% height loss with interval kyphoplasty with methylmethacrylate within the vertebral body without significant change in overall height. Persistent marrow edema throughout the vertebral body and to lesser extent posterior elements. Electronically Signed   By: Kathreen Devoid   On: 05/16/2019 08:09   Dg C-arm 1-60 Min  Result Date: 05/17/2019 CLINICAL DATA:  Kyphoplasty EXAM: THORACIC SPINE 2 VIEWS; DG C-ARM 61-120 MIN COMPARISON:  05/16/2019 MRI, fluoroscopic images 04/26/2019 FINDINGS: Two low resolution intraoperative spot views of the thoracic spine. Prior vertebral augmentation at T7. Interval augmentation changes at presumed T8 level. Cementing material anterior to T7-T8 disc level. Total fluoroscopy time was 1 minutes 51 seconds IMPRESSION: Intraoperative fluoroscopic assistance provided during thoracic spine kyphoplasty Electronically Signed   By: Donavan Foil M.D.   On: 05/17/2019 14:42    Time Spent in minutes  25 minutes  Phillips Climes M.D on 05/30/2019 at 11:41 AM  Between 7am to 7pm - Pager - [phone]  After 7pm go to [email or web address] - password [facility]  [facility] -  Office  [phone]

## 2019-05-31 LAB — COMPREHENSIVE METABOLIC PANEL
ALT: 25 U/L (ref 0–44)
AST: 36 U/L (ref 15–41)
Albumin: 2.8 g/dL — ABNORMAL LOW (ref 3.5–5.0)
Alkaline Phosphatase: 67 U/L (ref 38–126)
Anion gap: 11 (ref 5–15)
BUN: 26 mg/dL — ABNORMAL HIGH (ref 8–23)
CO2: 26 mmol/L (ref 22–32)
Calcium: 8.1 mg/dL — ABNORMAL LOW (ref 8.9–10.3)
Chloride: 102 mmol/L (ref 98–111)
Creatinine, Ser: 0.8 mg/dL (ref 0.44–1.00)
GFR calc Af Amer: >60 mL/min (ref >60)
GFR calc non Af Amer: >60 mL/min (ref >60)
Glucose, Bld: 144 mg/dL — ABNORMAL HIGH (ref 70–99)
Potassium: 4.1 mmol/L (ref 3.5–5.1)
Sodium: 139 mmol/L (ref 135–145)
Total Bilirubin: <0.1 mg/dL — ABNORMAL LOW (ref 0.3–1.2)
Total Protein: 5.8 g/dL — ABNORMAL LOW (ref 6.5–8.1)

## 2019-05-31 LAB — CBC WITH DIFFERENTIAL/PLATELET
Abs Immature Granulocytes: 0.21 10*3/uL — ABNORMAL HIGH (ref 0.00–0.07)
Basophils Absolute: 0 10*3/uL (ref 0.0–0.1)
Basophils Relative: 0 %
Eosinophils Absolute: 0 10*3/uL (ref 0.0–0.5)
Eosinophils Relative: 0 %
HCT: 32.8 % — ABNORMAL LOW (ref 36.0–46.0)
Hemoglobin: 9.9 g/dL — ABNORMAL LOW (ref 12.0–15.0)
Immature Granulocytes: 1 %
Lymphocytes Relative: 6 %
Lymphs Abs: 0.9 10*3/uL (ref 0.7–4.0)
MCH: 27.5 pg (ref 26.0–34.0)
MCHC: 30.2 g/dL (ref 30.0–36.0)
MCV: 91.1 fL (ref 80.0–100.0)
Monocytes Absolute: 0.7 10*3/uL (ref 0.1–1.0)
Monocytes Relative: 5 %
Neutro Abs: 13.2 10*3/uL — ABNORMAL HIGH (ref 1.7–7.7)
Neutrophils Relative %: 88 %
Platelets: 349 10*3/uL (ref 150–400)
RBC: 3.6 MIL/uL — ABNORMAL LOW (ref 3.87–5.11)
RDW: 15.4 % (ref 11.5–15.5)
WBC: 15.1 10*3/uL — ABNORMAL HIGH (ref 4.0–10.5)
nRBC: 0 % (ref 0.0–0.2)

## 2019-05-31 LAB — FERRITIN: Ferritin: 123 ng/mL (ref 11–307)

## 2019-05-31 LAB — C-REACTIVE PROTEIN: CRP: 4.1 mg/dL — ABNORMAL HIGH (ref <1.0)

## 2019-05-31 LAB — D-DIMER, QUANTITATIVE: D-Dimer, Quant: 5.48 ug/mL-FEU — ABNORMAL HIGH (ref 0.00–0.50)

## 2019-05-31 LAB — PHOSPHORUS: Phosphorus: 3.3 mg/dL (ref 2.5–4.6)

## 2019-05-31 LAB — MAGNESIUM: Magnesium: 2.4 mg/dL (ref 1.7–2.4)

## 2019-05-31 MED ORDER — METHYLPREDNISOLONE SODIUM SUCC 40 MG IJ SOLR
40.0000 mg | Freq: Every day | INTRAMUSCULAR | Status: DC
  Administered 2019-06-01 – 2019-06-02 (×2): 40 mg via INTRAVENOUS
  Filled 2019-05-31 (×2): qty 1

## 2019-05-31 MED ORDER — METHYLPREDNISOLONE SODIUM SUCC 40 MG IJ SOLR: 40.0000 mg | Freq: Two times a day (BID) | INTRAMUSCULAR | Status: DC

## 2019-05-31 NOTE — Plan of Care (Signed)
  Problem: Education: Goal: Knowledge of risk factors and measures for prevention of condition will improve Outcome: Progressing   Problem: Coping: Goal: Psychosocial and spiritual needs will be supported Outcome: Progressing   Problem: Respiratory: Goal: Will maintain a patent airway Outcome: Progressing Goal: Complications related to the disease process, condition or treatment will be avoided or minimized Outcome: Progressing   Problem: Education: Goal: Knowledge of General Education information will improve Description: Including pain rating scale, medication(s)/side effects and non-pharmacologic comfort measures Outcome: Progressing   Problem: Health Behavior/Discharge Planning: Goal: Ability to manage health-related needs will improve Outcome: Progressing   Problem: Clinical Measurements: Goal: Ability to maintain clinical measurements within normal limits will improve Outcome: Progressing Goal: Will remain free from infection Outcome: Progressing Goal: Respiratory complications will improve Outcome: Progressing   Problem: Activity: Goal: Risk for activity intolerance will decrease Outcome: Progressing   Problem: Coping: Goal: Level of anxiety will decrease Outcome: Progressing   Problem: Elimination: Goal: Will not experience complications related to bowel motility Outcome: Progressing

## 2019-05-31 NOTE — Care Management Important Message (Signed)
Important Message  Patient Details  Name: Erin [name] MRN: [id number] Date of Birth: February 14, 1946   Medicare Important Message Given:  Yes - Important Message mailed due to current National Emergency   Verbal consent obtained due to current National Emergency  Relationship to patient: Self Contact Name: Dina Warbington Call Date: 05/31/19  Time: 1322 Phone: [phone]   Important Message mailed to: Patient address on file      Lanson Randle 05/31/2019, 1:23 PM

## 2019-05-31 NOTE — TOC Initial Note (Signed)
Transition of Care [facility]) - Initial/Assessment Note    Patient Details  Name: Erin [name] MRN: [id number] Date of Birth: 15-Jul-1946  Transition of Care [facility]) CM/SW Contact:    Alberteen Sam, [place] Phone Number: [phone] 05/31/2019, 3:44 PM  Clinical Narrative:                  CSW consulted with patient's POA Erin [name] regarding discharge planning and PT recommendation of [facility] at discharge. Erin [name] is in agreement with this discharge plan, CSW informed him of the SNF's locally that are taking COVID patients. He reports preference for [facility]. Erin [name] would like to be updated on when patient discharges to [facility].   CSW sent referral to [facility]. They are able to accept patient. Plan for discharge tomorrow 6/24 to [facility].   Expected Discharge Plan: Skilled Nursing Facility Barriers to Discharge: Continued Medical Work up   Patient Goals and CMS Choice Patient states their goals for this hospitalization and ongoing recovery are:: Pt informed CM that she doesnt want to leave hosptial until she is feeling better CMS Medicare.gov Compare Post Acute Care list provided to:: Patient Represenative (must comment)(POA Erin [name]) Choice offered to / list presented to : [facility] POA / Sabino Donovan)  Expected Discharge Plan and Services Expected Discharge Plan: [place] Acute Care Choice: [place] Living arrangements for the past 2 months: Single Family Home                                      Prior Living Arrangements/Services Living arrangements for the past 2 months: Single Family Home Lives with:: Self Patient language and need for interpreter reviewed:: Yes Do you feel safe going back to the place where you live?: No   needs short term rehab before returning home  Need for Family Participation in Patient Care: Yes (Comment) Care giver support system in place?: Yes (comment) Current home services: DME(Cane) Criminal Activity/Legal  Involvement Pertinent to Current Situation/Hospitalization: No - Comment as needed  Activities of Daily Living Home Assistive Devices/Equipment: Walker (specify type), Dentures (specify type), Cane (specify quad or straight)(4 wheel walker, straight cane) ADL Screening (condition at time of admission) Patient's cognitive ability adequate to safely complete daily activities?: Yes Is the patient deaf or have difficulty hearing?: No Does the patient have difficulty seeing, even when wearing glasses/contacts?: Yes Does the patient have difficulty concentrating, remembering, or making decisions?: No Patient able to express need for assistance with ADLs?: Yes Does the patient have difficulty dressing or bathing?: No Independently performs ADLs?: Yes (appropriate for developmental age) Does the patient have difficulty walking or climbing stairs?: Yes Weakness of Legs: Both Weakness of Arms/Hands: Both  Permission Sought/Granted Permission sought to share information with : Case Manager, Customer service manager, Family Supports Permission granted to share information with : Yes, Verbal Permission Granted  Share Information with NAME: Erin [name]  Permission granted to share info w AGENCY: SNFs  Permission granted to share info w Relationship: POA  Permission granted to share info w Contact Information: [phone]  Emotional Assessment Appearance:: (unable to assess - remote) Attitude/Demeanor/Rapport: Unable to Assess Affect (typically observed): Unable to Assess Orientation: : Oriented to Self, Oriented to Place, Oriented to  Time, Oriented to Situation Alcohol / Substance Use: Not Applicable Psych Involvement: No (comment)  Admission diagnosis:  COVID 19 FALL Patient Active Problem List   Diagnosis  Date Noted  . Acute respiratory disease due to COVID-19 virus 05/28/2019  . Rheumatoid arthritis ([place]) 05/28/2019  . Varicose veins of leg with swelling, bilateral 06/25/2018  .  Hypertension 04/27/2018  . Acute diastolic CHF (congestive heart failure) ([place]) 03/18/2018  . Lower extremity edema 03/18/2018  . Food in esophagus causing other injury, initial encounter   . Reflux esophagitis   . Gastric nodule   . Symptomatic anemia 02/28/2018  . S/P hip hemiarthroplasty 02/26/2018  . Hip fracture ([place]) 02/22/2018  . Mixed hyperlipidemia 10/26/2017  . Coronary artery disease of native artery of native heart with stable angina pectoris ([place]) 10/24/2017  . Vulvar irritation 09/02/2017  . 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 Screening for cardiovascular condition 12/15/2016  . SOB (shortness of breath) 12/15/2016  . 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 Scleroderma ([place]) 12/15/2016  . Raynaud's disease without gangrene 12/15/2016  . Melanoma of skin ([place]) 11/11/2016  . Lesion of labia 11/11/2016   PCP:  Idelle Crouch, MD Pharmacy:   [place], [place] [address] [place] [address] Phone: [phone] Fax: [phone]  [facility] [place] [address] Phone: [phone] Fax: [phone]     Social Determinants of Health (SDOH) Interventions    Readmission Risk Interventions Readmission Risk Prevention Plan 05/29/2019  Transportation Screening Not Complete  Transportation Screening Comment Pt was independent and driving prior to getting sick, when she become sick her cousin provided transportation however cousin is now sick - will need to address closer to [place] or Home Care Consult Patient refused  Medication Review (RN Care Manager) Complete  Some recent data might be hidden

## 2019-05-31 NOTE — Progress Notes (Addendum)
PROGRESS NOTE                                                                                                                                                                                                             Patient Demographics:    Erin [name], is a 73 y.o. female, DOB - 05-05-1946, FSE:[id number]  Admit date - 05/28/2019   Admitting Physician Barb Merino, MD  Outpatient Primary MD for the patient is Sparks, [name], MD  LOS - 3   No chief complaint on file.      Brief Narrative    73 y.o. female, with past medical history of rheumatoid arthritis, scleroderma, Raynaud's phenomena, hypertension, [name], hyperlipidemia, tissue lung disease, and kyphoplasty 05/17/2019 at [facility], shortness of breath, she was COVID-19 positive, requiring oxygen at [facility] ED, transferred to [facility] for further management, oxygen requirement initially, improving on current medication, seen by PT, recommendation for SNF placement.   Subjective:    [name] today has, No headache, No chest pain, No abdominal pain - No Nausea, or generalized weakness, still reports some dyspnea.  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 -Continue to follow inflammatory markers, CRP trending down, ferritin remains within normal limit, D dimer is trending down as well. - on IV Remdesivir - She is high risk for decompensation especially with underlying interstitial lung disease - on IV Solu-Medrol,  continue with taper -Continue with vitamin C and zinc -Encouraged  to use incentive spirometry -D-dimers more than 5, will change Lovenox to twice daily per  DVT prophylaxis  COVID-19 protocol. -Leukocytosis most likely to steroid, nontoxic-appearing, afebrile COVID-19 Labs  Recent Labs    05/29/19 0205 05/30/19 0352 05/31/19 0335  DDIMER 6.42* 5.03* 5.48*  FERRITIN 129 136 123  CRP 11.0* 8.0* 4.1*    Lab Results  Component Value Date  [place] (A) 05/28/2019   [place] NEGATIVE 05/17/2019   SARSCOV2NAA NOT DETECTED 04/22/2019     Recent kyphoplasty of T7/T8 -PT/OT consulted, will need SNF placement  History scleroderma/rheumatoid arthritis/Raynaud's disease -Continue with home Norvasc. -Continue with steroids -Plaquenil on hold given she is on Remdesivir. -She is a steroid dependent, need to continue her home dose prednisone once weaned off steroids.  Interstitial lung disease -Disussed with pulmonary, will continue to hold OFEV, is been held as an outpatient, this can be resumed as an outpatient once felt appropriate by her primary pulmonary physician. -Continue with steroids  History of [name] -Continue with aspirin  Hyperlipidemia -Continue with home dose antilipid  Leukopenia/lymphopenia -Due to COVID 19, resolved    Code Status : DNR  Family Communication  : D/W patient  Disposition Plan  : Need SNF placement, SW consulted, be discharged to SNF once IV Remdesivir course finished  Barriers For Discharge : remains in remdesivir and IV steroids  Consults  :  None  Procedures  : none  DVT Prophylaxis  :  [place] lovenox  Lab Results  Component Value Date   PLT 349 05/31/2019    Antibiotics  :    Anti-infectives (From admission, onward)   Start     Dose/Rate Route Frequency Ordered Stop   05/29/19 2000  remdesivir 100 mg in sodium chloride 0.9 % 250 mL IVPB     100 mg 500 mL/hr over 30 Minutes Intravenous Every 24 hours 05/28/19  1845 06/02/19 1959   05/28/19 2200  hydroxychloroquine (PLAQUENIL) tablet 200 mg  Status:  Discontinued     200 mg Oral 2 times daily 05/28/19 1626 05/28/19 1657   05/28/19 2000  remdesivir 200 mg in sodium chloride 0.9 % 250 mL IVPB     200 mg 500 mL/hr over 30 Minutes Intravenous Once 05/28/19 1845 05/28/19 2200        Objective:   Vitals:   05/31/19 0710 05/31/19 0735 05/31/19 0830 05/31/19 1003  BP: 130/64   129/70  Pulse: 68     Resp:      Temp: (!) 97.4 F (36.3 C)     TempSrc: Oral     SpO2: 96%  96%   Weight:  84 kg    Height:  5\' 2"  (1.575 m)      Wt Readings from Last 3 Encounters:  05/31/19 84 kg  05/27/19 83.9 kg  05/17/19 84.1 kg     Intake/Output Summary (Last 24 hours) at 05/31/2019 1123 Last data filed at 05/31/2019 0956 Gross per 24 hour  Intake 490 ml  Output 900 ml  Net -410 ml     Physical Exam  Awake Alert, Oriented X 3, No new F.N deficits, Normal affect Symmetrical Chest wall movement, Good air movement bilaterally, CTAB RRR,No Gallops,Rubs or new Murmurs, No Parasternal Heave +ve B.Sounds, Abd Soft, No tenderness, No rebound - guarding or rigidity. No Cyanosis, Clubbing or edema, No new Rash or bruise       Data Review:    CBC Recent Labs  Lab 05/27/19 2017 05/28/19 1720 05/29/19 0205 05/30/19 0352 05/31/19 0335  WBC 5.4 4.5 2.4* 10.8* 15.1*  HGB 10.9* 10.1* 11.3* 10.2* 9.9*  HCT 34.6* 32.7* 37.4 32.8* 32.8*  PLT 339 328 316 318 349  MCV 90.1 90.8 91.9 91.1 91.1  MCH 28.4 28.1 27.8 28.3 27.5  MCHC 31.5 30.9 30.2 31.1 30.2  RDW 15.6* 15.4 15.5 15.4 15.4  LYMPHSABS 0.8  --  0.4* 1.1 0.9  MONOABS 0.8  --  0.1 0.6 0.7  EOSABS 0.0  --  0.0 0.0 0.0  BASOSABS 0.0  --  0.0 0.0 0.0    Chemistries  Recent Labs  Lab 05/27/19 2017 05/28/19 1720 05/29/19 0205 05/30/19 0352 05/31/19 0335  NA 136  --  137 139 139  K 3.8  --  3.7 4.0 4.1  CL 98  --  101 103 102  CO2 25  --  23 26 26   GLUCOSE 103*  --  157* 162* 144*  BUN 13   --  14 20 26*  CREATININE 1.11* 0.82 0.80 0.79 0.80  CALCIUM 7.7*  --  7.9* 8.1* 8.1*  MG  --   --  2.3 2.4 2.4  AST  --  38 42* 36 36  ALT  --  21 24 23 25   ALKPHOS  --  69 77 67 67  BILITOT  --  0.4 0.3 0.1* <0.1*   ------------------------------------------------------------------------------------------------------------------ No results for input(s): CHOL, HDL, LDLCALC, TRIG, CHOLHDL, LDLDIRECT in the last 72 hours.  No results found for: HGBA1C ------------------------------------------------------------------------------------------------------------------ No results for input(s): TSH, T4TOTAL, T3FREE, THYROIDAB in the last 72 hours.  Invalid input(s): FREET3 ------------------------------------------------------------------------------------------------------------------ Recent Labs    05/30/19 0352 05/31/19 0335  FERRITIN 136 123    Coagulation profile No results for input(s): INR, PROTIME in the last 168 hours.  Recent Labs    05/30/19 0352 05/31/19 0335  DDIMER 5.03* 5.48*    Cardiac Enzymes Recent Labs  Lab 05/27/19 2017  TROPONINI <0.03   ------------------------------------------------------------------------------------------------------------------    Component Value Date/Time   BNP 84.0 02/26/2018 1313    Inpatient Medications  Scheduled Meds:  amLODipine  10 mg Oral BID   aspirin EC  81 mg Oral Daily   DULoxetine  60 mg Oral Daily   enoxaparin (LOVENOX) injection  40 mg Subcutaneous BID   ezetimibe  10 mg Oral QHS   famotidine  20 mg Oral BID   gabapentin  300 mg Oral BID   latanoprost  1 drop Both Eyes QHS   mouth rinse  15 mL Mouth Rinse BID   methylPREDNISolone (SOLU-MEDROL) injection  80 mg Intravenous Q8H   omega-3 acid ethyl esters  1 g Oral BID   pantoprazole  40 mg Oral Daily   polyethylene glycol  17 g Oral Daily   QUEtiapine  25 mg Oral QHS   vitamin C  500 mg Oral Daily   zinc sulfate  220 mg Oral Daily    Continuous Infusions:  remdesivir 100 mg in NS 250 mL Stopped (05/31/19 0145)   PRN Meds:.albuterol, ALPRAZolam, benzonatate, chlorpheniramine-HYDROcodone, guaiFENesin-dextromethorphan, methocarbamol, oxyCODONE-acetaminophen **AND** oxyCODONE, zolpidem  Micro Results Recent Results (from the past 240 hour(s))  SARS Coronavirus 2 (CEPHEID- Performed in [facility] hospital lab), Hosp Order     Status: Abnormal   Collection Time: 05/28/19  3:28 AM   Specimen: Nasopharyngeal Swab  Result Value Ref Range Status   SARS Coronavirus 2 POSITIVE (A) NEGATIVE Final    Comment: CRITICAL RESULT CALLED TO, READ BACK BY AND VERIFIED WITH: page johnson @0520  05/28/2019 ttg (NOTE) If result is NEGATIVE SARS-CoV-2 target nucleic acids are NOT DETECTED. The SARS-CoV-2 RNA is generally detectable in upper and lower  respiratory specimens during the acute phase of infection. The lowest  concentration of SARS-CoV-2 viral copies this assay can detect is 250  copies / mL. A negative result does not preclude SARS-CoV-2 infection  and should not be used as the sole basis for  treatment or other  patient management decisions.  A negative result may occur with  improper specimen collection / handling, submission of specimen other  than nasopharyngeal swab, presence of viral mutation(s) within the  areas targeted by this assay, and inadequate number of viral copies  (<250 copies / mL). A negative result must be combined with clinical  observations, patient history, and epidemiological information. If result is POSITIVE SARS-CoV-2 target nucleic acids are DETECT ED. The SARS-CoV-2 RNA is generally detectable in upper and lower  respiratory specimens during the acute phase of infection.  Positive  results are indicative of active infection with SARS-CoV-2.  Clinical  correlation with patient history and other diagnostic information is  necessary to determine patient infection status.  Positive results do   not rule out bacterial infection or co-infection with other viruses. If result is PRESUMPTIVE POSTIVE SARS-CoV-2 nucleic acids MAY BE PRESENT.   A presumptive positive result was obtained on the submitted specimen  and confirmed on repeat testing.  While 2019 novel coronavirus  (SARS-CoV-2) nucleic acids may be present in the submitted sample  additional confirmatory testing may be necessary for epidemiological  and / or clinical management purposes  to differentiate between  SARS-CoV-2 and other Sarbecovirus currently known to infect humans.  If clinically indicated additional testing with an alternate test  methodology (LAB74 53) is advised. The SARS-CoV-2 RNA is generally  detectable in upper and lower respiratory specimens during the acute  phase of infection. The expected result is Negative. Fact Sheet for Patients:  StrictlyIdeas.no Fact Sheet for Healthcare Providers: BankingDealers.co.za This test is not yet approved or cleared by the [place] FDA and has been authorized for detection and/or diagnosis of SARS-CoV-2 by FDA under an Emergency Use Authorization (EUA).  This EUA will remain in effect (meaning this test can be used) for the duration of the COVID-19 declaration under Section 564(b)(1) of the Act, 21 U.S.C. section 360bbb-3(b)(1), unless the authorization is terminated or revoked sooner. Performed at [facility], [address], [place],  [address]     Radiology Reports Dg Chest 1 View  Result Date: 05/27/2019 CLINICAL DATA:  Shortness of breath. EXAM: CHEST  1 VIEW COMPARISON:  Radiograph 10/05/2018. High-resolution chest CT 11/02/2018 FINDINGS: Lower lung volumes from prior exam. Chronic interstitial lung disease with interstitial coarsening and bibasilar honeycombing. Findings are grossly similar. Unchanged heart size and mediastinal contours. No acute airspace disease. No large pleural  effusion. No pneumothorax. Kyphoplasty visualized in the midthoracic spine. Patient's chin obscures the apices. IMPRESSION: Low lung volumes and chronic interstitial lung disease. No acute abnormalities. Electronically Signed   By: Keith Rake M.D.   On: 05/27/2019 22:37   Dg Thoracic Spine 2 View  Result Date: 05/17/2019 CLINICAL DATA:  Kyphoplasty EXAM: THORACIC SPINE 2 VIEWS; DG C-ARM 61-120 MIN COMPARISON:  05/16/2019 MRI, fluoroscopic images 04/26/2019 FINDINGS: Two low resolution intraoperative spot views of the thoracic spine. Prior vertebral augmentation at T7. Interval augmentation changes at presumed T8 level. Cementing material anterior to T7-T8 disc level. Total fluoroscopy time was 1 minutes 51 seconds IMPRESSION: Intraoperative fluoroscopic assistance provided during thoracic spine kyphoplasty Electronically Signed   By: Donavan Foil M.D.   On: 05/17/2019 14:42   Ct Angio Chest Pe W And/or Wo Contrast  Result Date: 05/28/2019 CLINICAL DATA:  Chest pain.  Recent fall. EXAM: CT ANGIOGRAPHY CHEST WITH CONTRAST TECHNIQUE: Multidetector CT imaging of the chest was performed using the standard protocol during bolus administration of intravenous contrast. Multiplanar CT  image reconstructions and MIPs were obtained to evaluate the vascular anatomy. CONTRAST:  52mL OMNIPAQUE IOHEXOL 350 MG/ML SOLN COMPARISON:  Chest radiograph from one day prior. 11/02/2018 high-resolution chest CT. FINDINGS: Cardiovascular: The study is moderate quality for the evaluation of pulmonary embolism, degraded by motion. There are no filling defects in the central, lobar, segmental or subsegmental pulmonary artery branches to suggest acute pulmonary embolism. Normal course and caliber of the thoracic aorta. Dilated main pulmonary artery (3.6 cm diameter), stable. Normal heart size. No significant pericardial fluid/thickening. Left anterior descending coronary atherosclerosis. Mediastinum/Nodes: No discrete thyroid  nodules. Patulous debris-filled thoracic esophagus. No axillary adenopathy. Mildly enlarged 1.0 cm left prevascular node (series 5/image 88), new. Newly enlarged 1.3 cm subcarinal node (series 5/image 112). New mild bilateral hilar adenopathy measuring 1.1 cm on the right and 1.2 cm on the left. Lungs/Pleura: No pneumothorax. No significant pleural effusions. There is new patchy consolidation and ground-glass opacity throughout both lungs involving the upper and lower lobes bilaterally, superimposed on a background of patchy confluent peripheral reticulation, ground-glass attenuation, moderate traction bronchiectasis and architectural distortion with a basilar predominance. No discrete lung masses or significant pulmonary nodules. Upper abdomen: Simple 2.2 cm right liver lobe cyst. Granulomatous splenic calcifications. Small hiatal hernia. Musculoskeletal: No aggressive appearing focal osseous lesions. Multiple healed right rib deformities. Severe T7 and T8 vertebral compression fracture status post vertebroplasty with exaggerated thoracic kyphosis. Moderate thoracic spondylosis. Review of the MIP images confirms the above findings. IMPRESSION: 1. No evidence of pulmonary embolism. 2. Findings are suggestive of acute multilobar atypical pneumonia superimposed on a background of fibrotic interstitial lung disease (as characterized on 11/02/2018 high-resolution chest CT study). There are a spectrum of findings in the lungs which can be seen with acute atypical infection (as well as other non-infectious etiologies). In particular, viral pneumonia (including COVID-19) should be considered in the appropriate clinical setting. 3. New mild mediastinal and bilateral hilar lymphadenopathy, nonspecific, probably reactive. 4. Stable dilated main pulmonary artery, suggesting chronic pulmonary arterial hypertension. 5. Patulous debris-filled thoracic esophagus. Aortic Atherosclerosis (ICD10-I70.0). These results were called by  telephone at the time of interpretation on 05/28/2019 at 4:17 am to Dr. Marjean Donna , who verbally acknowledged these results. Electronically Signed   By: Ilona Sorrel M.D.   On: 05/28/2019 04:17   Mr Thoracic Spine Wo Contrast  Result Date: 05/16/2019 CLINICAL DATA:  Kyphoplasty 3 weeks ago. Mid thoracic pain radiating bilaterally. EXAM: MRI THORACIC SPINE WITHOUT CONTRAST TECHNIQUE: Multiplanar, multisequence MR imaging of the thoracic spine was performed. No intravenous contrast was administered. COMPARISON:  04/19/2019 FINDINGS: Alignment:  Physiologic. Vertebrae: Again noted is a T7 vertebral body compression fracture with approximately 65% height loss with interval kyphoplasty with methylmethacrylate within the vertebral body without significant change in overall height. Persistent marrow edema throughout the vertebral body and to lesser extent posterior elements. Interval development of a T8 vertebral body compression fracture with approximately 60% anterior height loss and marrow edema throughout the vertebral body. No discitis or osteomyelitis.  No aggressive osseous lesion. Cord:  Normal signal and morphology. Paraspinal and other soft tissues: No acute paraspinal abnormality. Disc levels: Disc spaces: Degenerative disc disease with mild disc height loss at T4-5, T5-6 and C6-7. T1-T2: No disc protrusion, foraminal stenosis or central canal stenosis. T2-T3: No disc protrusion, foraminal stenosis or central canal stenosis. T3-T4: No disc protrusion, foraminal stenosis or central canal stenosis. T4-T5: Minimal broad-based disc bulge. No foraminal or central canal stenosis. T5-T6: Minimal broad-based disc bulge. No foraminal or  central canal stenosis. T6-T7: No disc protrusion, foraminal stenosis or central canal stenosis. T7-T8: No significant disc protrusion. Mild bilateral foraminal narrowing. Mild central canal stenosis. T8-T9: No disc protrusion, foraminal stenosis or central canal stenosis. T9-T10:  No disc protrusion, foraminal stenosis or central canal stenosis. T10-T11: Mild broad-based disc bulge. No foraminal or central canal stenosis. T11-T12: No disc protrusion, foraminal stenosis or central canal stenosis. L1-2: Mild broad-based disc bulge. No foraminal or central canal stenosis. Mild bilateral facet arthropathy. L2-3: Broad-based disc bulge eccentric towards the left. Mild left foraminal stenosis. No right foraminal stenosis. No central canal stenosis. IMPRESSION: 1. Acute progressive T8 vertebral body compression fracture with approximately 60% height loss and marrow edema throughout the vertebral body. 2. T7 vertebral body compression fracture with approximately 65% height loss with interval kyphoplasty with methylmethacrylate within the vertebral body without significant change in overall height. Persistent marrow edema throughout the vertebral body and to lesser extent posterior elements. Electronically Signed   By: Kathreen Devoid   On: 05/16/2019 08:09   Dg C-arm 1-60 Min  Result Date: 05/17/2019 CLINICAL DATA:  Kyphoplasty EXAM: THORACIC SPINE 2 VIEWS; DG C-ARM 61-120 MIN COMPARISON:  05/16/2019 MRI, fluoroscopic images 04/26/2019 FINDINGS: Two low resolution intraoperative spot views of the thoracic spine. Prior vertebral augmentation at T7. Interval augmentation changes at presumed T8 level. Cementing material anterior to T7-T8 disc level. Total fluoroscopy time was 1 minutes 51 seconds IMPRESSION: Intraoperative fluoroscopic assistance provided during thoracic spine kyphoplasty Electronically Signed   By: Donavan Foil M.D.   On: 05/17/2019 14:42    Time Spent in minutes  25 minutes  Phillips Climes M.D on 05/31/2019 at 11:23 AM  Between 7am to 7pm - Pager - [phone]  After 7pm go to [email or web address] - password [facility]  [facility] -  Office  [phone]

## 2019-05-31 NOTE — Care Management Important Message (Signed)
  Verbal consent obtained due to current [employer]  Relationship to patient: Self Contact Name: Labella Zahradnik Call Date: 05/31/19  Time: 1338 Phone: [phone]   Important Message mailed to: Patient address on file   Important Message  Patient Details  Name: Erin [name] MRN: [id number] Date of Birth: 10-21-1946   Medicare Important Message Given:  Yes - Important Message mailed due to current [facility] Emergency     Orbie Pyo 05/31/2019, 1:39 PM

## 2019-05-31 NOTE — Progress Notes (Signed)
Called POA [place] with an update this morning. His questions about the discharge plan were answered and he was grateful for the update. Will continue to monitor patient.

## 2019-06-01 ENCOUNTER — Encounter (HOSPITAL_COMMUNITY): Payer: Self-pay

## 2019-06-01 LAB — CBC WITH DIFFERENTIAL/PLATELET
Abs Immature Granulocytes: 0.75 10*3/uL — ABNORMAL HIGH (ref 0.00–0.07)
Basophils Absolute: 0.1 10*3/uL (ref 0.0–0.1)
Basophils Relative: 1 %
Eosinophils Absolute: 0 10*3/uL (ref 0.0–0.5)
Eosinophils Relative: 0 %
HCT: 32.2 % — ABNORMAL LOW (ref 36.0–46.0)
Hemoglobin: 10 g/dL — ABNORMAL LOW (ref 12.0–15.0)
Immature Granulocytes: 6 %
Lymphocytes Relative: 9 %
Lymphs Abs: 1.3 10*3/uL (ref 0.7–4.0)
MCH: 28.2 pg (ref 26.0–34.0)
MCHC: 31.1 g/dL (ref 30.0–36.0)
MCV: 90.7 fL (ref 80.0–100.0)
Monocytes Absolute: 1.5 10*3/uL — ABNORMAL HIGH (ref 0.1–1.0)
Monocytes Relative: 12 %
Neutro Abs: 9.8 10*3/uL — ABNORMAL HIGH (ref 1.7–7.7)
Neutrophils Relative %: 72 %
Platelets: 373 10*3/uL (ref 150–400)
RBC: 3.55 MIL/uL — ABNORMAL LOW (ref 3.87–5.11)
RDW: 15.4 % (ref 11.5–15.5)
WBC: 13.4 10*3/uL — ABNORMAL HIGH (ref 4.0–10.5)
nRBC: 0.1 % (ref 0.0–0.2)

## 2019-06-01 LAB — D-DIMER, QUANTITATIVE: D-Dimer, Quant: 5.08 ug/mL-FEU — ABNORMAL HIGH (ref 0.00–0.50)

## 2019-06-01 LAB — COMPREHENSIVE METABOLIC PANEL
ALT: 26 U/L (ref 0–44)
AST: 34 U/L (ref 15–41)
Albumin: 2.8 g/dL — ABNORMAL LOW (ref 3.5–5.0)
Alkaline Phosphatase: 64 U/L (ref 38–126)
Anion gap: 7 (ref 5–15)
BUN: 25 mg/dL — ABNORMAL HIGH (ref 8–23)
CO2: 27 mmol/L (ref 22–32)
Calcium: 7.9 mg/dL — ABNORMAL LOW (ref 8.9–10.3)
Chloride: 105 mmol/L (ref 98–111)
Creatinine, Ser: 0.79 mg/dL (ref 0.44–1.00)
GFR calc Af Amer: >60 mL/min (ref >60)
GFR calc non Af Amer: >60 mL/min (ref >60)
Glucose, Bld: 94 mg/dL (ref 70–99)
Potassium: 3.7 mmol/L (ref 3.5–5.1)
Sodium: 139 mmol/L (ref 135–145)
Total Bilirubin: 0.2 mg/dL — ABNORMAL LOW (ref 0.3–1.2)
Total Protein: 5.5 g/dL — ABNORMAL LOW (ref 6.5–8.1)

## 2019-06-01 LAB — FERRITIN: Ferritin: 109 ng/mL (ref 11–307)

## 2019-06-01 LAB — C-REACTIVE PROTEIN: CRP: 2.3 mg/dL — ABNORMAL HIGH (ref <1.0)

## 2019-06-01 LAB — PHOSPHORUS: Phosphorus: 3 mg/dL (ref 2.5–4.6)

## 2019-06-01 LAB — MAGNESIUM: Magnesium: 2.3 mg/dL (ref 1.7–2.4)

## 2019-06-01 NOTE — Progress Notes (Addendum)
CSW attempted to call into patient's room, continued to ring with no answer.   Update: CSW spoke with patient, she reports she had spoken with [name] her POA who explained to her that there were 3 options of SNF that takes [place] was the best for PT/OT that she needs. CSW acknowledged patient wanting ot be in [place] and explained that there were no SNFs accepting covid patients in that area. That her options were [facility] in [place], [facility] in [place] or [facility] in [place]. Patient said she feels better about going to [place] now, and that she has family or friends that can work on getting her clothes there. CSW informed patient she will most likely discharge to [facility] tomorrow, patient accepting of this.   [place] Erin [name], [place]

## 2019-06-01 NOTE — Progress Notes (Signed)
Spoke with POA [name] and updated on pt condition/plan of care. Answered al questions and concerns.

## 2019-06-01 NOTE — Progress Notes (Signed)
PROGRESS NOTE                                                                                                                                                                                                             Patient Demographics:    Erin [name], is a 73 y.o. female, DOB - [birth year], IFO:[id number]  Admit date - 05/28/2019     Outpatient Primary MD for the patient is Doy Hutching, [name], MD  LOS - 4  Brief Narrative   73 y.o. female, with past medical history of rheumatoid arthritis, scleroderma, Raynaud's phenomena, hypertension, [name], hyperlipidemia, tissue lung disease, and kyphoplasty 05/17/2019 at [facility], shortness of breath, she was COVID-19 positive, requiring oxygen at [facility] ED, transferred to [facility] for further management, oxygen requirement initially, improving on current medication, seen by PT, recommendation for SNF placement.   Subjective:   Patient states that she continues to have dry cough occasionally.  Occasional wheezing.  No chest pain.  No nausea or vomiting.  Feeling anxious about needing to go to rehab first before going home.     Assessment  & Plan :   Acute hypoxic respiratory failure due to COVID-19 pneumonia Patient was requiring oxygen during the earlier part of this hospitalization.  Her inflammatory markers were elevated.  She was placed on IV Remdesivir and IV steroids.  She was given vitamin C and zinc.  Symptomatically she has improved.  Her inflammatory markers have improved as well.  CRP is down to 2.3.  D-dimer remains elevated at 5.08.  She is on twice a day dose of Lovenox.  She will complete course of Remdesivir tonight.  She is currently saturating normal on room air.  Mobilize as tolerated.  Incentive spirometry.  Prone positioning as tolerated.    COVID-19 Labs  Recent Labs    05/30/19 0352 05/31/19 0335 06/01/19 0315  DDIMER 5.03* 5.48* 5.08*  FERRITIN 136 123 109  CRP 8.0* 4.1* 2.3*    Lab Results   Component Value Date   SARSCOV2NAA POSITIVE (A) 05/28/2019   [place] NEGATIVE 05/17/2019   SARSCOV2NAA NOT DETECTED 04/22/2019     Recent kyphoplasty of T7/T8 Stable.  She will need short-term rehab.  PT OT following.  History of scleroderma/rheumatoid arthritis/Raynaud's disease -Continue with home Norvasc. -Continue with steroids -Plaquenil on hold given she is on Remdesivir. -She is  a steroid dependent, need to continue her home dose prednisone once weaned off steroids.  Interstitial lung disease Seems to be stable.  Patient was on O FEV as outpatient which was held by her outpatient providers.  Continue to hold for now.  This can be determined in the outpatient setting. -Continue with steroids  History of [name] Stable.  No chest pain.  Continue with aspirin.  Hyperlipidemia -Continue with home dose antilipid  Leukopenia/lymphopenia -Due to COVID 19, resolved    Code Status : DNR  Family Communication  : D/W patient  Disposition Plan  : Need SNF placement, SW consulted, be discharged to SNF once IV Remdesivir course finished  Barriers For Discharge : remains in remdesivir and IV steroids  Consults  :  None  Procedures  : none  DVT Prophylaxis  :  [place] lovenox  Lab Results  Component Value Date   PLT 373 06/01/2019    Antibiotics  :    Anti-infectives (From admission, onward)   Start     Dose/Rate Route Frequency Ordered Stop   05/29/19 2000  remdesivir 100 mg in sodium chloride 0.9 % 250 mL IVPB     100 mg 500 mL/hr over 30 Minutes Intravenous Every 24 hours 05/28/19 1845 06/02/19 1959   05/28/19 2200  hydroxychloroquine (PLAQUENIL) tablet 200 mg  Status:  Discontinued     200 mg Oral 2 times daily 05/28/19 1626 05/28/19 1657   05/28/19 2000  remdesivir 200 mg in sodium chloride 0.9 % 250 mL IVPB     200 mg 500 mL/hr over 30 Minutes Intravenous Once 05/28/19 1845 05/28/19 2200        Objective:   Vitals:   05/31/19 1556 05/31/19 2005  06/01/19 0645 06/01/19 1157  BP: (!) 155/67 (!) 142/75 136/72 134/61  Pulse: 97 85  79  Resp:  18  16  Temp: 97.7 F (36.5 C) 98.4 F (36.9 C) 98 F (36.7 C) 97.8 F (36.6 C)  TempSrc: Oral Oral Oral Oral  SpO2: 93% 99%  98%  Weight:      Height:        Wt Readings from Last 3 Encounters:  05/31/19 84 kg  05/27/19 83.9 kg  05/17/19 84.1 kg     Intake/Output Summary (Last 24 hours) at 06/01/2019 1218 Last data filed at 06/01/2019 0938 Gross per 24 hour  Intake 360 ml  Output 1400 ml  Net -1040 ml     Physical Exam General appearance: Awake alert.  In no distress Resp: Coarse breath sounds bilaterally with coarse crackles.  No wheezing or rhonchi.  Normal effort at rest. Cardio: S1-S2 is normal regular.  No S3-S4.  No rubs murmurs or bruit GI: Abdomen is soft.  Nontender nondistended.  Bowel sounds are present normal.  No masses organomegaly Extremities: No edema.  Full range of motion of lower extremities. Neurologic: Alert and oriented x3.  No focal neurological deficits.        Data Review:    CBC Recent Labs  Lab 05/27/19 2017 05/28/19 1720 05/29/19 0205 05/30/19 0352 05/31/19 0335 06/01/19 0315  WBC 5.4 4.5 2.4* 10.8* 15.1* 13.4*  HGB 10.9* 10.1* 11.3* 10.2* 9.9* 10.0*  HCT 34.6* 32.7* 37.4 32.8* 32.8* 32.2*  PLT 339 328 316 318 349 373  MCV 90.1 90.8 91.9 91.1 91.1 90.7  MCH 28.4 28.1 27.8 28.3 27.5 28.2  MCHC 31.5 30.9 30.2 31.1 30.2 31.1  RDW 15.6* 15.4 15.5 15.4 15.4 15.4  LYMPHSABS 0.8  --  0.4* 1.1 0.9 1.3  MONOABS 0.8  --  0.1 0.6 0.7 1.5*  EOSABS 0.0  --  0.0 0.0 0.0 0.0  BASOSABS 0.0  --  0.0 0.0 0.0 0.1    Chemistries  Recent Labs  Lab 05/27/19 2017 05/28/19 1720 05/29/19 0205 05/30/19 0352 05/31/19 0335 06/01/19 0315  NA 136  --  137 139 139 139  K 3.8  --  3.7 4.0 4.1 3.7  CL 98  --  101 103 102 105  CO2 25  --  23 26 26 27   GLUCOSE 103*  --  157* 162* 144* 94  BUN 13  --  14 20 26* 25*  CREATININE 1.11* 0.82 0.80 0.79  0.80 0.79  CALCIUM 7.7*  --  7.9* 8.1* 8.1* 7.9*  MG  --   --  2.3 2.4 2.4 2.3  AST  --  38 42* 36 36 34  ALT  --  21 24 23 25 26   ALKPHOS  --  69 77 67 67 64  BILITOT  --  0.4 0.3 0.1* <0.1* 0.2*    Recent Labs    05/31/19 0335 06/01/19 0315  FERRITIN 123 109     Recent Labs    05/31/19 0335 06/01/19 0315  DDIMER 5.48* 5.08*    Cardiac Enzymes Recent Labs  Lab 05/27/19 2017  TROPONINI <0.03    Inpatient Medications  Scheduled Meds: . amLODipine  10 mg Oral BID  . aspirin EC  81 mg Oral Daily  . DULoxetine  60 mg Oral Daily  . enoxaparin (LOVENOX) injection  40 mg Subcutaneous BID  . ezetimibe  10 mg Oral QHS  . famotidine  20 mg Oral BID  . gabapentin  300 mg Oral BID  . latanoprost  1 drop Both Eyes QHS  . mouth rinse  15 mL Mouth Rinse BID  . methylPREDNISolone (SOLU-MEDROL) injection  40 mg Intravenous Daily  . omega-3 acid ethyl esters  1 g Oral BID  . pantoprazole  40 mg Oral Daily  . polyethylene glycol  17 g Oral Daily  . QUEtiapine  25 mg Oral QHS  . vitamin C  500 mg Oral Daily  . zinc sulfate  220 mg Oral Daily   Continuous Infusions: . remdesivir 100 mg in NS 250 mL 100 mg (05/31/19 2120)   PRN Meds:.albuterol, ALPRAZolam, benzonatate, chlorpheniramine-HYDROcodone, guaiFENesin-dextromethorphan, methocarbamol, oxyCODONE-acetaminophen **AND** oxyCODONE, zolpidem   Micro Results Recent Results (from the past 240 hour(s))  SARS Coronavirus 2 (CEPHEID- Performed in [place] hospital lab), Hosp Order     Status: Abnormal   Collection Time: 05/28/19  3:28 AM   Specimen: Nasopharyngeal Swab  Result Value Ref Range Status   SARS Coronavirus 2 POSITIVE (A) NEGATIVE Final    Comment: CRITICAL RESULT CALLED TO, READ BACK BY AND VERIFIED WITH: page johnson @0520  05/28/2019 ttg (NOTE) If result is NEGATIVE SARS-CoV-2 target nucleic acids are NOT DETECTED. The SARS-CoV-2 RNA is generally detectable in upper and lower  respiratory specimens during  the acute phase of infection. The lowest  concentration of SARS-CoV-2 viral copies this assay can detect is 250  copies / mL. A negative result does not preclude SARS-CoV-2 infection  and should not be used as the sole basis for treatment or other  patient management decisions.  A negative result may occur with  improper specimen collection / handling, submission of specimen other  than nasopharyngeal swab, presence of viral mutation(s) within the  areas targeted by this assay, and inadequate number  of viral copies  (<250 copies / mL). A negative result must be combined with clinical  observations, patient history, and epidemiological information. If result is POSITIVE SARS-CoV-2 target nucleic acids are DETECT ED. The SARS-CoV-2 RNA is generally detectable in upper and lower  respiratory specimens during the acute phase of infection.  Positive  results are indicative of active infection with SARS-CoV-2.  Clinical  correlation with patient history and other diagnostic information is  necessary to determine patient infection status.  Positive results do  not rule out bacterial infection or co-infection with other viruses. If result is PRESUMPTIVE POSTIVE SARS-CoV-2 nucleic acids MAY BE PRESENT.   A presumptive positive result was obtained on the submitted specimen  and confirmed on repeat testing.  While 2019 novel coronavirus  (SARS-CoV-2) nucleic acids may be present in the submitted sample  additional confirmatory testing may be necessary for epidemiological  and / or clinical management purposes  to differentiate between  SARS-CoV-2 and other Sarbecovirus currently known to infect humans.  If clinically indicated additional testing with an alternate test  methodology (LAB74 53) is advised. The SARS-CoV-2 RNA is generally  detectable in upper and lower respiratory specimens during the acute  phase of infection. The expected result is Negative. Fact Sheet for Patients:   StrictlyIdeas.no Fact Sheet for Healthcare Providers: BankingDealers.co.za This test is not yet approved or cleared by the [place] FDA and has been authorized for detection and/or diagnosis of SARS-CoV-2 by FDA under an Emergency Use Authorization (EUA).  This EUA will remain in effect (meaning this test can be used) for the duration of the COVID-19 declaration under Section 564(b)(1) of the Act, 21 U.S.C. section 360bbb-3(b)(1), unless the authorization is terminated or revoked sooner. Performed at [facility], [address] [place], [place] [address]     Radiology Reports Dg Chest 1 View  Result Date: 05/27/2019 CLINICAL DATA:  Shortness of breath. EXAM: CHEST  1 VIEW COMPARISON:  Radiograph 10/05/2018. High-resolution chest CT 11/02/2018 FINDINGS: Lower lung volumes from prior exam. Chronic interstitial lung disease with interstitial coarsening and bibasilar honeycombing. Findings are grossly similar. Unchanged heart size and mediastinal contours. No acute airspace disease. No large pleural effusion. No pneumothorax. Kyphoplasty visualized in the midthoracic spine. Patient's chin obscures the apices. IMPRESSION: Low lung volumes and chronic interstitial lung disease. No acute abnormalities. Electronically Signed   By: Keith Rake M.D.   On: 05/27/2019 22:37    Ct Angio Chest Pe W And/or Wo Contrast  Result Date: 05/28/2019 CLINICAL DATA:  Chest pain.  Recent fall. EXAM: CT ANGIOGRAPHY CHEST WITH CONTRAST TECHNIQUE: Multidetector CT imaging of the chest was performed using the standard protocol during bolus administration of intravenous contrast. Multiplanar CT image reconstructions and MIPs were obtained to evaluate the vascular anatomy. CONTRAST:  43mL OMNIPAQUE IOHEXOL 350 MG/ML SOLN COMPARISON:  Chest radiograph from one day prior. 11/02/2018 high-resolution chest CT. FINDINGS: Cardiovascular: The study is moderate  quality for the evaluation of pulmonary embolism, degraded by motion. There are no filling defects in the central, lobar, segmental or subsegmental pulmonary artery branches to suggest acute pulmonary embolism. Normal course and caliber of the thoracic aorta. Dilated main pulmonary artery (3.6 cm diameter), stable. Normal heart size. No significant pericardial fluid/thickening. Left anterior descending coronary atherosclerosis. Mediastinum/Nodes: No discrete thyroid nodules. Patulous debris-filled thoracic esophagus. No axillary adenopathy. Mildly enlarged 1.0 cm left prevascular node (series 5/image 88), new. Newly enlarged 1.3 cm subcarinal node (series 5/image 112). New mild bilateral hilar adenopathy measuring 1.1  cm on the right and 1.2 cm on the left. Lungs/Pleura: No pneumothorax. No significant pleural effusions. There is new patchy consolidation and ground-glass opacity throughout both lungs involving the upper and lower lobes bilaterally, superimposed on a background of patchy confluent peripheral reticulation, ground-glass attenuation, moderate traction bronchiectasis and architectural distortion with a basilar predominance. No discrete lung masses or significant pulmonary nodules. Upper abdomen: Simple 2.2 cm right liver lobe cyst. Granulomatous splenic calcifications. Small hiatal hernia. Musculoskeletal: No aggressive appearing focal osseous lesions. Multiple healed right rib deformities. Severe T7 and T8 vertebral compression fracture status post vertebroplasty with exaggerated thoracic kyphosis. Moderate thoracic spondylosis. Review of the MIP images confirms the above findings. IMPRESSION: 1. No evidence of pulmonary embolism. 2. Findings are suggestive of acute multilobar atypical pneumonia superimposed on a background of fibrotic interstitial lung disease (as characterized on 11/02/2018 high-resolution chest CT study). There are a spectrum of findings in the lungs which can be seen with acute  atypical infection (as well as other non-infectious etiologies). In particular, viral pneumonia (including COVID-19) should be considered in the appropriate clinical setting. 3. New mild mediastinal and bilateral hilar lymphadenopathy, nonspecific, probably reactive. 4. Stable dilated main pulmonary artery, suggesting chronic pulmonary arterial hypertension. 5. Patulous debris-filled thoracic esophagus. Aortic Atherosclerosis (ICD10-I70.0). These results were called by telephone at the time of interpretation on 05/28/2019 at 4:17 am to Dr. Marjean Donna , who verbally acknowledged these results. Electronically Signed   By: Ilona Sorrel M.D.   On: 05/28/2019 04:17     Bonnielee Haff M.D on 06/01/2019 at 12:18 PM  Pager: [email or web address] - password [facility]  [facility] -  Office  [phone]

## 2019-06-01 NOTE — Plan of Care (Signed)
  Problem: Education: Goal: Knowledge of risk factors and measures for prevention of condition will improve Outcome: Progressing   Problem: Coping: Goal: Psychosocial and spiritual needs will be supported Outcome: Progressing   Problem: Respiratory: Goal: Will maintain a patent airway Outcome: Progressing Goal: Complications related to the disease process, condition or treatment will be avoided or minimized Outcome: Progressing   Problem: Education: Goal: Knowledge of General Education information will improve Description: Including pain rating scale, medication(s)/side effects and non-pharmacologic comfort measures Outcome: Progressing   Problem: Health Behavior/Discharge Planning: Goal: Ability to manage health-related needs will improve Outcome: Progressing   Problem: Clinical Measurements: Goal: Ability to maintain clinical measurements within normal limits will improve Outcome: Progressing Goal: Will remain free from infection Outcome: Progressing

## 2019-06-01 NOTE — Progress Notes (Addendum)
Occupational Therapy Treatment Patient Details Name: Erin [name] MRN: [id number] DOB: 1946/05/07 Today's Date: 06/01/2019    History of present illness 73 y.o. female, presented to ED 05/28/19 with shortness of breath, she was COVID-19 positive, requiring oxygen at [facility] ED, transferred to [facility] for further management. PMH- rheumatoid arthritis, scleroderma, Raynaud's phenomena, hypertension, [name], hyperlipidemia, tissue lung disease, and kyphoplasty 05/17/2019 for T7/T8 fracture   OT comments  Pt progressing towards established OT goals. Pt performing toileting with Min Guard A for safety. Pt performing functional mobility in hallway with RW and Min Guard A to simulated home distance. Pt continues to present with poor activity tolerance, SOB, and fatigues quickly. SpO2 >90% on RA throughout. Continue to recommend dc to SNF and will continue to follow acutely as admitted.    Follow Up Recommendations  SNF    Equipment Recommendations  (Defer to next venue)    Recommendations for Other Services PT consult    Precautions / Restrictions Precautions Precautions: Fall Precaution Comments: fell one week ago; backwards and head hit wall; other near falls Restrictions Weight Bearing Restrictions: No       Mobility Bed Mobility               General bed mobility comments: In recliner upon arrival  Transfers Overall transfer level: Needs assistance Equipment used: Rolling walker (2 wheeled) Transfers: Sit to/from Stand Sit to Stand: Min guard         General transfer comment: Min Guard A for safety    Balance Overall balance assessment: Needs assistance Sitting-balance support: No upper extremity supported;Feet supported Sitting balance-Leahy Scale: Fair     Standing balance support: Bilateral upper extremity supported;During functional activity Standing balance-Leahy Scale: Poor Standing balance comment: Reliant on UE support                            ADL either performed or assessed with clinical judgement   ADL Overall ADL's : Needs assistance/impaired     Grooming: Wash/dry hands;Min Dispensing optician: Min guard;Ambulation;Regular Toilet;Grab bars;RW   Toileting- Clothing Manipulation and Hygiene: Supervision/safety;Sitting/lateral lean       Functional mobility during ADLs: Min guard;Rolling walker General ADL Comments: Pt performing toileting and functional mobility in hallway for home simulation. Pt continues to present with decreased activity tolerance and fatigues quickly. Requiring seated rest breaks     Vision       Perception     Praxis      Cognition Arousal/Alertness: Awake/alert Behavior During Therapy: WFL for tasks assessed/performed Overall Cognitive Status: Within Functional Limits for tasks assessed                                          Exercises     Shoulder Instructions       General Comments SpO2 >90% on RA.     Pertinent Vitals/ Pain       Pain Assessment: Faces Faces Pain Scale: Hurts even more Pain Location: low back Pain Descriptors / Indicators: Constant Pain Intervention(s): Monitored during session;Limited activity within patient's tolerance;Repositioned  Home Living  Prior Functioning/Environment              Frequency  Min 3X/week        Progress Toward Goals  OT Goals(current goals can now be found in the care plan section)  Progress towards OT goals: Progressing toward goals  Acute Rehab OT Goals Patient Stated Goal: get stronger for potential return to living alone vs find an assisted living to move into OT Goal Formulation: With patient Time For Goal Achievement: 06/13/19 Potential to Achieve Goals: Good ADL Goals Pt Will Perform Grooming: with modified independence;standing Pt Will Perform Lower Body Dressing: with modified  independence;sit to/from stand;with adaptive equipment Pt Will Transfer to Toilet: with modified independence;ambulating;regular height toilet Pt Will Perform Toileting - Clothing Manipulation and hygiene: with modified independence;sit to/from stand;sitting/lateral leans  Plan Discharge plan remains appropriate    Co-evaluation                 AM-PAC OT "6 Clicks" Daily Activity     Outcome Measure   Help from another person eating meals?: None Help from another person taking care of personal grooming?: A Little Help from another person toileting, which includes using toliet, bedpan, or urinal?: A Little Help from another person bathing (including washing, rinsing, drying)?: A Lot Help from another person to put on and taking off regular upper body clothing?: A Little Help from another person to put on and taking off regular lower body clothing?: A Lot 6 Click Score: 17    End of Session Equipment Utilized During Treatment: Rolling walker  OT Visit Diagnosis: Unsteadiness on feet (R26.81);Other abnormalities of gait and mobility (R26.89);Muscle weakness (generalized) (M62.81);Pain Pain - part of body: (Back)   Activity Tolerance Patient tolerated treatment well   Patient Left in chair;with call bell/phone within reach   Nurse Communication Mobility status;Patient requests pain meds        Time: 1046-1106 OT Time Calculation (min): 20 min  Charges: OT General Charges $OT Visit: 1 Visit OT Treatments $Therapeutic Activity: 8-22 mins  [place], OTR/L Acute Rehab Pager: [phone] Office: [place] 06/01/2019, 12:34 PM

## 2019-06-02 LAB — COMPREHENSIVE METABOLIC PANEL
ALT: 28 U/L (ref 0–44)
AST: 36 U/L (ref 15–41)
Albumin: 2.8 g/dL — ABNORMAL LOW (ref 3.5–5.0)
Alkaline Phosphatase: 65 U/L (ref 38–126)
Anion gap: 7 (ref 5–15)
BUN: 21 mg/dL (ref 8–23)
CO2: 29 mmol/L (ref 22–32)
Calcium: 8 mg/dL — ABNORMAL LOW (ref 8.9–10.3)
Chloride: 102 mmol/L (ref 98–111)
Creatinine, Ser: 0.74 mg/dL (ref 0.44–1.00)
GFR calc Af Amer: >60 mL/min (ref >60)
GFR calc non Af Amer: >60 mL/min (ref >60)
Glucose, Bld: 74 mg/dL (ref 70–99)
Potassium: 3.9 mmol/L (ref 3.5–5.1)
Sodium: 138 mmol/L (ref 135–145)
Total Bilirubin: 0.3 mg/dL (ref 0.3–1.2)
Total Protein: 5.5 g/dL — ABNORMAL LOW (ref 6.5–8.1)

## 2019-06-02 LAB — CBC WITH DIFFERENTIAL/PLATELET
Abs Immature Granulocytes: 1.49 10*3/uL — ABNORMAL HIGH (ref 0.00–0.07)
Basophils Absolute: 0 10*3/uL (ref 0.0–0.1)
Basophils Relative: 0 %
Eosinophils Absolute: 0 10*3/uL (ref 0.0–0.5)
Eosinophils Relative: 0 %
HCT: 34.5 % — ABNORMAL LOW (ref 36.0–46.0)
Hemoglobin: 10.6 g/dL — ABNORMAL LOW (ref 12.0–15.0)
Immature Granulocytes: 11 %
Lymphocytes Relative: 18 %
Lymphs Abs: 2.4 10*3/uL (ref 0.7–4.0)
MCH: 28.2 pg (ref 26.0–34.0)
MCHC: 30.7 g/dL (ref 30.0–36.0)
MCV: 91.8 fL (ref 80.0–100.0)
Monocytes Absolute: 1.5 10*3/uL — ABNORMAL HIGH (ref 0.1–1.0)
Monocytes Relative: 11 %
Neutro Abs: 7.8 10*3/uL — ABNORMAL HIGH (ref 1.7–7.7)
Neutrophils Relative %: 60 %
Platelets: 381 10*3/uL (ref 150–400)
RBC: 3.76 MIL/uL — ABNORMAL LOW (ref 3.87–5.11)
RDW: 15.7 % — ABNORMAL HIGH (ref 11.5–15.5)
WBC: 13.2 10*3/uL — ABNORMAL HIGH (ref 4.0–10.5)
nRBC: 0.4 % — ABNORMAL HIGH (ref 0.0–0.2)

## 2019-06-02 LAB — GLUCOSE, CAPILLARY: Glucose-Capillary: 125 mg/dL — ABNORMAL HIGH (ref 70–99)

## 2019-06-02 LAB — MAGNESIUM: Magnesium: 2.3 mg/dL (ref 1.7–2.4)

## 2019-06-02 LAB — PHOSPHORUS: Phosphorus: 3.3 mg/dL (ref 2.5–4.6)

## 2019-06-02 MED ORDER — ENOXAPARIN SODIUM 40 MG/0.4ML ~~LOC~~ SOLN: SUBCUTANEOUS | Status: DC

## 2019-06-02 MED ORDER — OXYCODONE-ACETAMINOPHEN 7.5-325 MG PO TABS: 1.0000 | ORAL_TABLET | ORAL | 0 refills | Status: DC | PRN

## 2019-06-02 MED ORDER — PREDNISONE 10 MG PO TABS: ORAL_TABLET | ORAL | Status: DC

## 2019-06-02 MED ORDER — ALPRAZOLAM 0.5 MG PO TABS: 0.5000 mg | ORAL_TABLET | Freq: Four times a day (QID) | ORAL | 0 refills | Status: DC | PRN

## 2019-06-02 MED ORDER — ZOLPIDEM TARTRATE 10 MG PO TABS: 10.0000 mg | ORAL_TABLET | Freq: Every evening | ORAL | 0 refills | Status: DC | PRN

## 2019-06-02 MED ORDER — PREDNISONE 5 MG PO TABS: 5.0000 mg | ORAL_TABLET | Freq: Two times a day (BID) | ORAL | Status: DC

## 2019-06-02 MED ORDER — ZINC SULFATE 220 (50 ZN) MG PO CAPS: 220.0000 mg | ORAL_CAPSULE | Freq: Every day | ORAL | Status: AC

## 2019-06-02 MED ORDER — ALBUTEROL SULFATE HFA 108 (90 BASE) MCG/ACT IN AERS: 2.0000 | INHALATION_SPRAY | RESPIRATORY_TRACT | Status: AC | PRN

## 2019-06-02 MED ORDER — ASCORBIC ACID 500 MG PO TABS: 500.0000 mg | ORAL_TABLET | Freq: Every day | ORAL | Status: AC

## 2019-06-02 NOTE — Plan of Care (Signed)
  Problem: Education: Goal: Knowledge of risk factors and measures for prevention of condition will improve Outcome: Adequate for Discharge   Problem: Coping: Goal: Psychosocial and spiritual needs will be supported Outcome: Adequate for Discharge   Problem: Respiratory: Goal: Will maintain a patent airway Outcome: Adequate for Discharge Goal: Complications related to the disease process, condition or treatment will be avoided or minimized Outcome: Adequate for Discharge   Problem: Education: Goal: Knowledge of General Education information will improve Description: Including pain rating scale, medication(s)/side effects and non-pharmacologic comfort measures Outcome: Adequate for Discharge   Problem: Health Behavior/Discharge Planning: Goal: Ability to manage health-related needs will improve Outcome: Adequate for Discharge   Problem: Clinical Measurements: Goal: Ability to maintain clinical measurements within normal limits will improve Outcome: Adequate for Discharge Goal: Will remain free from infection Outcome: Adequate for Discharge Goal: Respiratory complications will improve Outcome: Adequate for Discharge   Problem: Activity: Goal: Risk for activity intolerance will decrease Outcome: Adequate for Discharge   Problem: Coping: Goal: Level of anxiety will decrease Outcome: Adequate for Discharge   Problem: Elimination: Goal: Will not experience complications related to bowel motility Outcome: Adequate for Discharge

## 2019-06-02 NOTE — Progress Notes (Signed)
Attempted to call report to [facility]. Nurse not avail at this time to take report. Will cb again

## 2019-06-02 NOTE — Discharge Summary (Signed)
[facility]  Physician Discharge Summary   Patient ID: Erin [name] MRN: [id number] DOB/AGE: Jan 02, 1946 73 y.o.  Admit date: 05/28/2019 Discharge date: 06/02/2019  PCP: Idelle Crouch, MD  DISCHARGE DIAGNOSES:  Acute respiratory disease due to COVID-19 Recent kyphoplasty of T7-T8 History of scleroderma History of rheumatoid arthritis History of Raynaud's disease Steroid-dependent History of interstitial lung disease History of coronary artery disease   RECOMMENDATIONS FOR OUTPATIENT FOLLOW UP: 1. Check CBC and basic metabolic panel in 1 week 2. Patient was started on OFEV by her pulmonologist a few months ago but it was stopped due to possible side effects.  This will need to be addressed when she follows up with Dr. Alva Garnet.    Home Health: None Equipment/Devices: None  CODE STATUS: DNR  DISCHARGE CONDITION: fair  Diet recommendation: Heart healthy  INITIAL HISTORY: 73 y.o.female,with past medical history of rheumatoid arthritis, scleroderma, Raynaud's phenomena, hypertension, [name], hyperlipidemia, tissue lung disease, and kyphoplasty 05/17/2019 at [facility], shortness of breath, she was COVID-19 positive, requiring oxygen at [facility] ED, transferred to [facility] for further management, oxygen requirement initially, improving on current medication, seen by PT, recommendation for SNF placement.   HOSPITAL COURSE:   Acute hypoxic respiratory failure due to COVID-19 pneumonia Patient was requiring oxygen during the earlier part of this hospitalization.  Her inflammatory markers were elevated. She was placed on IV Remdesivir and IV steroids.  She was given vitamin C and zinc.  Symptomatically she has improved.  Her inflammatory markers have improved as well.  CRP is down to 2.3.  D-dimer remains elevated at 5.08.  She is on twice a day dose of Lovenox.    We will leave her on Lovenox for a total of 2 weeks at the skilled nursing facility.  She has completed a 5-day course of  Remdesivir.  She is saturating normal on room air.  She was seen by physical and Occupational Therapy.  Skilled nursing facility was recommended.  We agree with this recommendation as the patient lives by herself and is deconditioned due to acute illness.  Okay for discharge today.    Recent kyphoplasty of T7/T8 Stable.  She will need short-term rehab.  History of scleroderma/rheumatoid arthritis/Raynaud's disease Stable.  Continue home medications including amlodipine and hydroxychloroquine.  She is also chronically on steroids which can be resumed after she completes the taper that has been ordered as well.    Interstitial lung disease Seems to be stable.  Patient was on OFEV as outpatient which was held by her outpatient providers.  Continue to hold for now.  This can be determined in the outpatient setting.  Continue with prednisone.  History of [name] Stable.  No chest pain.  Continue with aspirin.  Hyperlipidemia Continue with home dose antilipid  Leukopenia/lymphopenia Due to COVID 19, resolved  Overall stable.  Okay for discharge to skilled nursing facility today.   PERTINENT LABS:  The results of significant diagnostics from this hospitalization (including imaging, microbiology, ancillary and laboratory) are listed below for reference.    Microbiology: Recent Results (from the past 240 hour(s))  SARS Coronavirus 2 (CEPHEID- Performed in [place] hospital lab), Hosp Order     Status: Abnormal   Collection Time: 05/28/19  3:28 AM   Specimen: Nasopharyngeal Swab  Result Value Ref Range Status   SARS Coronavirus 2 POSITIVE (A) NEGATIVE Final    Comment: CRITICAL RESULT CALLED TO, READ BACK BY AND VERIFIED WITH: page johnson @0520  05/28/2019 ttg (NOTE) If result is NEGATIVE SARS-CoV-2 target  nucleic acids are NOT DETECTED. The SARS-CoV-2 RNA is generally detectable in upper and lower  respiratory specimens during the acute phase of infection. The lowest    concentration of SARS-CoV-2 viral copies this assay can detect is 250  copies / mL. A negative result does not preclude SARS-CoV-2 infection  and should not be used as the sole basis for treatment or other  patient management decisions.  A negative result may occur with  improper specimen collection / handling, submission of specimen other  than nasopharyngeal swab, presence of viral mutation(s) within the  areas targeted by this assay, and inadequate number of viral copies  (<250 copies / mL). A negative result must be combined with clinical  observations, patient history, and epidemiological information. If result is POSITIVE SARS-CoV-2 target nucleic acids are DETECT ED. The SARS-CoV-2 RNA is generally detectable in upper and lower  respiratory specimens during the acute phase of infection.  Positive  results are indicative of active infection with SARS-CoV-2.  Clinical  correlation with patient history and other diagnostic information is  necessary to determine patient infection status.  Positive results do  not rule out bacterial infection or co-infection with other viruses. If result is PRESUMPTIVE POSTIVE SARS-CoV-2 nucleic acids MAY BE PRESENT.   A presumptive positive result was obtained on the submitted specimen  and confirmed on repeat testing.  While 2019 novel coronavirus  (SARS-CoV-2) nucleic acids may be present in the submitted sample  additional confirmatory testing may be necessary for epidemiological  and / or clinical management purposes  to differentiate between  SARS-CoV-2 and other Sarbecovirus currently known to infect humans.  If clinically indicated additional testing with an alternate test  methodology (LAB74 53) is advised. The SARS-CoV-2 RNA is generally  detectable in upper and lower respiratory specimens during the acute  phase of infection. The expected result is Negative. Fact Sheet for Patients:  StrictlyIdeas.no Fact Sheet  for Healthcare Providers: BankingDealers.co.za This test is not yet approved or cleared by the [place] FDA and has been authorized for detection and/or diagnosis of SARS-CoV-2 by FDA under an Emergency Use Authorization (EUA).  This EUA will remain in effect (meaning this test can be used) for the duration of the COVID-19 declaration under Section 564(b)(1) of the Act, 21 U.S.C. section 360bbb-3(b)(1), unless the authorization is terminated or revoked sooner. Performed at [facility], [address] [place], [place] [address]      Labs: Basic Metabolic Panel: Recent Labs  Lab 05/29/19 0205 05/30/19 0352 05/31/19 0335 06/01/19 0315 06/02/19 0320  NA 137 139 139 139 138  K 3.7 4.0 4.1 3.7 3.9  CL 101 103 102 105 102  CO2 23 26 26 27 29   GLUCOSE 157* 162* 144* 94 74  BUN 14 20 26* 25* 21  CREATININE 0.80 0.79 0.80 0.79 0.74  CALCIUM 7.9* 8.1* 8.1* 7.9* 8.0*  MG 2.3 2.4 2.4 2.3 2.3  PHOS 4.5 3.9 3.3 3.0 3.3   Liver Function Tests: Recent Labs  Lab 05/29/19 0205 05/30/19 0352 05/31/19 0335 06/01/19 0315 06/02/19 0320  AST 42* 36 36 34 36  ALT 24 23 25 26 28   ALKPHOS 77 67 67 64 65  BILITOT 0.3 0.1* <0.1* 0.2* 0.3  PROT 6.0* 5.7* 5.8* 5.5* 5.5*  ALBUMIN 3.0* 2.8* 2.8* 2.8* 2.8*   CBC: Recent Labs  Lab 05/29/19 0205 05/30/19 0352 05/31/19 0335 06/01/19 0315 06/02/19 0320  WBC 2.4* 10.8* 15.1* 13.4* 13.2*  NEUTROABS 1.8 8.9* 13.2* 9.8* 7.8*  HGB 11.3* 10.2* 9.9* 10.0* 10.6*  HCT 37.4 32.8* 32.8* 32.2* 34.5*  MCV 91.9 91.1 91.1 90.7 91.8  PLT 316 318 349 373 381   Cardiac Enzymes: Recent Labs  Lab 05/27/19 2017  TROPONINI <0.03    CBG: Recent Labs  Lab 05/28/19 1634 06/01/19 1735  GLUCAP 142* 125*     IMAGING STUDIES Dg Chest 1 View  Result Date: 05/27/2019 CLINICAL DATA:  Shortness of breath. EXAM: CHEST  1 VIEW COMPARISON:  Radiograph 10/05/2018. High-resolution chest CT 11/02/2018 FINDINGS: Lower lung  volumes from prior exam. Chronic interstitial lung disease with interstitial coarsening and bibasilar honeycombing. Findings are grossly similar. Unchanged heart size and mediastinal contours. No acute airspace disease. No large pleural effusion. No pneumothorax. Kyphoplasty visualized in the midthoracic spine. Patient's chin obscures the apices. IMPRESSION: Low lung volumes and chronic interstitial lung disease. No acute abnormalities. Electronically Signed   By: Keith Rake M.D.   On: 05/27/2019 22:37   Ct Angio Chest Pe W And/or Wo Contrast  Result Date: 05/28/2019 CLINICAL DATA:  Chest pain.  Recent fall. EXAM: CT ANGIOGRAPHY CHEST WITH CONTRAST TECHNIQUE: Multidetector CT imaging of the chest was performed using the standard protocol during bolus administration of intravenous contrast. Multiplanar CT image reconstructions and MIPs were obtained to evaluate the vascular anatomy. CONTRAST:  23mL OMNIPAQUE IOHEXOL 350 MG/ML SOLN COMPARISON:  Chest radiograph from one day prior. 11/02/2018 high-resolution chest CT. FINDINGS: Cardiovascular: The study is moderate quality for the evaluation of pulmonary embolism, degraded by motion. There are no filling defects in the central, lobar, segmental or subsegmental pulmonary artery branches to suggest acute pulmonary embolism. Normal course and caliber of the thoracic aorta. Dilated main pulmonary artery (3.6 cm diameter), stable. Normal heart size. No significant pericardial fluid/thickening. Left anterior descending coronary atherosclerosis. Mediastinum/Nodes: No discrete thyroid nodules. Patulous debris-filled thoracic esophagus. No axillary adenopathy. Mildly enlarged 1.0 cm left prevascular node (series 5/image 88), new. Newly enlarged 1.3 cm subcarinal node (series 5/image 112). New mild bilateral hilar adenopathy measuring 1.1 cm on the right and 1.2 cm on the left. Lungs/Pleura: No pneumothorax. No significant pleural effusions. There is new patchy  consolidation and ground-glass opacity throughout both lungs involving the upper and lower lobes bilaterally, superimposed on a background of patchy confluent peripheral reticulation, ground-glass attenuation, moderate traction bronchiectasis and architectural distortion with a basilar predominance. No discrete lung masses or significant pulmonary nodules. Upper abdomen: Simple 2.2 cm right liver lobe cyst. Granulomatous splenic calcifications. Small hiatal hernia. Musculoskeletal: No aggressive appearing focal osseous lesions. Multiple healed right rib deformities. Severe T7 and T8 vertebral compression fracture status post vertebroplasty with exaggerated thoracic kyphosis. Moderate thoracic spondylosis. Review of the MIP images confirms the above findings. IMPRESSION: 1. No evidence of pulmonary embolism. 2. Findings are suggestive of acute multilobar atypical pneumonia superimposed on a background of fibrotic interstitial lung disease (as characterized on 11/02/2018 high-resolution chest CT study). There are a spectrum of findings in the lungs which can be seen with acute atypical infection (as well as other non-infectious etiologies). In particular, viral pneumonia (including COVID-19) should be considered in the appropriate clinical setting. 3. New mild mediastinal and bilateral hilar lymphadenopathy, nonspecific, probably reactive. 4. Stable dilated main pulmonary artery, suggesting chronic pulmonary arterial hypertension. 5. Patulous debris-filled thoracic esophagus. Aortic Atherosclerosis (ICD10-I70.0). These results were called by telephone at the time of interpretation on 05/28/2019 at 4:17 am to Dr. Marjean Donna , who verbally acknowledged these results. Electronically Signed   By: Janina Mayo.D.  On: 05/28/2019 04:17     DISCHARGE EXAMINATION: Vitals:   06/01/19 2002 06/01/19 2115 06/02/19 0434 06/02/19 0728  BP: 132/72  (!) 103/55 (!) 133/57  Pulse: 88 72  82  Resp: 17  17 18   Temp: 98.4  F (36.9 C)  97.6 F (36.4 C) 98.4 F (36.9 C)  TempSrc: Oral  Oral Oral  SpO2: 100% 98% 94% 96%  Weight:      Height:       General appearance: Awake alert.  In no distress Resp: Coarse breath sounds bilaterally with a few crackles at the bases.  No wheezing or rhonchi. Cardio: S1-S2 is normal regular.  No S3-S4.  No rubs murmurs or bruit GI: Abdomen is soft.  Nontender nondistended.  Bowel sounds are present normal.  No masses organomegaly Extremities: No edema.  Full range of motion of lower extremities.   DISPOSITION: SNF  Discharge Instructions    Call MD for:  difficulty breathing, headache or visual disturbances   Complete by: As directed    Call MD for:  extreme fatigue   Complete by: As directed    Call MD for:  persistant dizziness or light-headedness   Complete by: As directed    Call MD for:  persistant nausea and vomiting   Complete by: As directed    Call MD for:  severe uncontrolled pain   Complete by: As directed    Call MD for:  temperature >100.4   Complete by: As directed    Discharge instructions   Complete by: As directed    Please review instructions on the discharge summary.  You were cared for by a hospitalist during your hospital stay. If you have any questions about your discharge medications or the care you received while you were in the hospital after you are discharged, you can call the unit and asked to speak with the hospitalist on call if the hospitalist that took care of you is not available. Once you are discharged, your primary care physician will handle any further medical issues. Please note that NO REFILLS for any discharge medications will be authorized once you are discharged, as it is imperative that you return to your primary care physician (or establish a relationship with a primary care physician if you do not have one) for your aftercare needs so that they can reassess your need for medications and monitor your lab values. If you do not  have a primary care physician, you can call [phone] for a physician referral.   Increase activity slowly   Complete by: As directed         Allergies as of 06/02/2019      Reactions   Remicade [infliximab] Shortness Of Breath, Itching   Sulfa Antibiotics Other (See Comments)   Other Reaction: tounge cracked   Actonel [risedronate Sodium] Other (See Comments)   Fosamax [alendronate Sodium]    GI Bleed   Procardia [nifedipine] Hives   Sulfur Swelling   Wellbutrin [bupropion] Anxiety      Medication List    STOP taking these medications   levofloxacin 500 MG tablet Commonly known as: LEVAQUIN   Nintedanib 100 MG Caps Commonly known as: Ofev     TAKE these medications   albuterol 108 (90 Base) MCG/ACT inhaler Commonly known as: VENTOLIN HFA Inhale 2 puffs into the lungs every 4 (four) hours as needed for wheezing or shortness of breath.   ALPRAZolam 0.5 MG tablet Commonly known as: XANAX Take 1 tablet (0.5 mg total)  by mouth 4 (four) times daily as needed for anxiety.   amLODipine 10 MG tablet Commonly known as: NORVASC Take 10 mg by mouth 2 (two) times daily. What changed: Another medication with the same name was removed. Continue taking this medication, and follow the directions you see here.   ascorbic acid 500 MG tablet Commonly known as: VITAMIN C Take 1 tablet (500 mg total) by mouth daily for 10 days. Start taking on: June 03, 2019   aspirin 81 MG tablet Take 81 mg by mouth daily.   benzonatate 200 MG capsule Commonly known as: TESSALON Take 200 mg by mouth 3 (three) times daily as needed for cough.   dexlansoprazole 60 MG capsule Commonly known as: DEXILANT Take 1 capsule (60 mg total) by mouth daily. What changed: when to take this   DULoxetine 30 MG capsule Commonly known as: CYMBALTA Take 60 mg by mouth every morning.   enoxaparin 40 MG/0.4ML injection Commonly known as: LOVENOX 40mg  Subcutaneously Q12hours for 1 week and then once daily for  1 week.   ezetimibe 10 MG tablet Commonly known as: ZETIA Take 1 tablet (10 mg total) by mouth daily. What changed: when to take this   Fish Oil 1000 MG Caps Take 1 capsule by mouth 2 (two) times daily.   furosemide 20 MG tablet Commonly known as: LASIX 20 mg daily.   gabapentin 300 MG capsule Commonly known as: NEURONTIN Take 300 mg by mouth 2 (two) times daily.   hydroxychloroquine 200 MG tablet Commonly known as: PLAQUENIL Take 200 mg by mouth 2 (two) times daily.   latanoprost 0.005 % ophthalmic solution Commonly known as: XALATAN Place 1 drop into both eyes at bedtime.   methocarbamol 500 MG tablet Commonly known as: ROBAXIN Take 500 mg by mouth 4 (four) times daily.   Multi-Vitamin tablet Take 1 tablet by mouth daily.   oxyCODONE-acetaminophen 7.5-325 MG tablet Commonly known as: Percocet Take 1 tablet by mouth every 4 (four) hours as needed for severe pain.   polyethylene glycol 17 g packet Commonly known as: MIRALAX / GLYCOLAX Take 17 g by mouth daily.   predniSONE 10 MG tablet Commonly known as: DELTASONE Take 4 tablets once daily for 3 days followed by 3 tablets once daily for 3 days followed by 2 tablets once daily for 3 days followed by usual prednisone dose. What changed: You were already taking a medication with the same name, and this prescription was added. Make sure you understand how and when to take each.   predniSONE 5 MG tablet Commonly known as: DELTASONE Take 1 tablet (5 mg total) by mouth 2 (two) times daily with a meal. RESUME AFTER COMPLETING THE PREDNISONE TAPER ORDERED SEPARATELY Start taking on: June 11, 2019 What changed:   additional instructions  These instructions start on June 11, 2019. If you are unsure what to do until then, ask your doctor or other care provider.   QUEtiapine 25 MG tablet Commonly known as: SEROQUEL Take 25 mg by mouth Nightly.   zinc sulfate 220 (50 Zn) MG capsule Take 1 capsule (220 mg total) by mouth  daily for 10 days. Start taking on: June 03, 2019   zolpidem 10 MG tablet Commonly known as: AMBIEN Take 1 tablet (10 mg total) by mouth at bedtime as needed for sleep.         Contact information for follow-up providers    Sparks, [name], MD. Schedule an appointment as soon as possible for a visit in  3 week(s).   Specialty: Internal Medicine Contact information: [place] [address] [phone]        Wilhelmina Mcardle, MD. Schedule an appointment as soon as possible for a visit in 4 week(s).   Specialty: Pulmonary Disease Contact information: [place] [address] [phone]            Contact information for after-discharge care    Destination    [facility]erred [facility] .   Service: Skilled Nursing Contact information: [place] [phone]                  TOTAL DISCHARGE TIME: 12 minutes  [place]  [facility] Pager on [email or web address]  06/02/2019, 10:18 AM

## 2019-06-02 NOTE — Progress Notes (Signed)
Physical Therapy Treatment Patient Details Name: Erin [name] MRN: [id number] DOB: 1946/09/07 Today's Date: 06/02/2019    History of Present Illness Pt is a 73 y.o. female admitted 05/28/19 with SOB and hypoxia. COVID-19 positive. PMH includes RA, scleradoma, Raynaud's, HTN, [name]; of note, s/p recent kyphoplasty for T7-8 fx on 05/17/19.   PT Comments    Pt progressing with mobility. Ambulatory with RW at supervision-level on room air; pt remains limited by decreased activity tolerance and generalized weakness, requiring seated rest breaks with minimal activity. Pt hopeful for d/c to SNF today. Continue to recommend SNF-level therapies to maximize functional mobility and independence prior to return home.   Follow Up Recommendations  SNF     Equipment Recommendations  None recommended by PT    Recommendations for Other Services       Precautions / Restrictions Precautions Precautions: Fall Precaution Comments: fell one week ago; backwards and head hit wall; other near falls Restrictions Weight Bearing Restrictions: No    Mobility  Bed Mobility Overal bed mobility: Modified Independent Bed Mobility: Sit to Supine       Sit to supine: Modified independent (Device/Increase time)   General bed mobility comments: In recliner upon arrival; mod indep to return to supine  Transfers Overall transfer level: Needs assistance Equipment used: Rolling walker (2 wheeled) Transfers: Sit to/from Stand Sit to Stand: Supervision         General transfer comment: Cues for correct hand placement; pt able to perform 3x sit<>stand during session with supervision for safety  Ambulation/Gait Ambulation/Gait assistance: Min guard;Supervision Gait Distance (Feet): 46 Feet(+46) Assistive device: Rolling walker (2 wheeled) Gait Pattern/deviations: Step-through pattern;Decreased stride length;Trunk flexed Gait velocity: Decreased Gait velocity interpretation: <1.8 ft/sec, indicate of risk for  recurrent falls General Gait Details: Amb in room with RW at supervision-level; 1x prolonged seated rest break secondary to DOE. Unable to get reliable pulse ox read via finger; pt on RA   Stairs             Wheelchair Mobility    Modified Rankin (Stroke Patients Only)       Balance Overall balance assessment: Needs assistance Sitting-balance support: No upper extremity supported;Feet supported Sitting balance-Leahy Scale: Fair     Standing balance support: Bilateral upper extremity supported;During functional activity Standing balance-Leahy Scale: Poor Standing balance comment: Reliant on UE support                            Cognition Arousal/Alertness: Awake/alert Behavior During Therapy: WFL for tasks assessed/performed Overall Cognitive Status: Within Functional Limits for tasks assessed                                        Exercises      General Comments        Pertinent Vitals/Pain Pain Assessment: Faces Faces Pain Scale: Hurts a little bit Pain Location: low back Pain Descriptors / Indicators: Constant Pain Intervention(s): Monitored during session    Home Living                      Prior Function            PT Goals (current goals can now be found in the care plan section) Acute Rehab PT Goals Patient Stated Goal: get stronger for potential return to living alone vs find an  assisted living to move into PT Goal Formulation: With patient Time For Goal Achievement: 06/13/19 Potential to Achieve Goals: Good Progress towards PT goals: Progressing toward goals    Frequency    Min 2X/week      PT Plan Current plan remains appropriate    Co-evaluation              AM-PAC PT "6 Clicks" Mobility   Outcome Measure  Help needed turning from your back to your side while in a flat bed without using bedrails?: None Help needed moving from lying on your back to sitting on the side of a flat bed  without using bedrails?: None Help needed moving to and from a bed to a chair (including a wheelchair)?: A Little Help needed standing up from a chair using your arms (e.g., wheelchair or bedside chair)?: A Little Help needed to walk in hospital room?: A Little Help needed climbing 3-5 steps with a railing? : A Lot 6 Click Score: 19    End of Session   Activity Tolerance: Patient tolerated treatment well Patient left: in bed;with call bell/phone within reach;with bed alarm set Nurse Communication: Mobility status PT Visit Diagnosis: Difficulty in walking, not elsewhere classified (R26.2);Muscle weakness (generalized) (M62.81)     Time: 8184-0375 PT Time Calculation (min) (ACUTE ONLY): 16 min  Charges:  $Gait Training: 8-22 mins                    Mabeline Caras, PT, DPT Acute Rehabilitation Services  Pager [phone] Office [place] 06/02/2019, 12:53 PM

## 2019-06-02 NOTE — TOC Transition Note (Signed)
Transition of Care [facility]) - CM/SW Discharge Note   Patient Details  Name: Erin [name] MRN: [id number] Date of Birth: March 15, 1946  Transition of Care [facility]) CM/SW Contact:  Alberteen Sam, LCSW Phone Number: 06/02/2019, 1:28 PM   Clinical Narrative:     Patient will DC to: [facility] Anticipated DC date: 06/02/2019 Family notified: [name] Transport by: Corey Harold  Per MD patient ready for DC to [place] . RN, patient, patient's family, and facility notified of DC. Discharge Summary sent to facility. RN given number for report  [phone] Room 908P. DC packet on chart. Ambulance transport requested for patient.  CSW signing off.  [place], [place]    Final next level of care: Skilled Nursing Facility Barriers to Discharge: No Barriers Identified   Patient Goals and CMS Choice Patient states their goals for this hospitalization and ongoing recovery are:: Pt informed CM that she doesnt want to leave hosptial until she is feeling better CMS Medicare.gov Compare Post Acute Care list provided to:: Patient Represenative (must comment)([place] (POA)) Choice offered to / list presented to : [facility] POA / Guardian([place])  Discharge Placement PASRR number recieved: 05/30/19            Patient chooses bed at: [facility] Patient to be transferred to facility by: [place] Name of family member notified: [name] [facility]) Patient and family notified of of transfer: 06/02/19  Discharge Plan and Services     Post Acute Care Choice: [place]                               Social Determinants of Health (SDOH) Interventions     Readmission Risk Interventions Readmission Risk Prevention Plan 05/29/2019  Transportation Screening Not Complete  Transportation Screening Comment Pt was independent and driving prior to getting sick, when she become sick her cousin provided transportation however cousin is now sick - will need to address closer to [place] or Home Care Consult Patient  refused  Medication Review (RN Care Manager) Complete  Some recent data might be hidden

## 2019-06-08 ENCOUNTER — Other Ambulatory Visit: Payer: Self-pay

## 2019-06-28 ENCOUNTER — Other Ambulatory Visit: Admission: RE | Admit: 2019-06-28 | Payer: Medicare Other | Source: Ambulatory Visit

## 2019-06-30 ENCOUNTER — Ambulatory Visit: Payer: Medicare Other

## 2019-07-05 ENCOUNTER — Ambulatory Visit: Payer: Medicare Other | Admitting: Pulmonary Disease

## 2019-07-06 ENCOUNTER — Other Ambulatory Visit: Payer: Self-pay | Admitting: Orthopedic Surgery

## 2019-07-06 DIAGNOSIS — S22070A Wedge compression fracture of T9-T10 vertebra, initial encounter for closed fracture: Secondary | ICD-10-CM

## 2019-07-06 DIAGNOSIS — Z9889 Other specified postprocedural states: Secondary | ICD-10-CM

## 2019-07-13 ENCOUNTER — Telehealth: Payer: Self-pay | Admitting: Pulmonary Disease

## 2019-07-13 NOTE — Telephone Encounter (Signed)
Yes. Resume once a day  Thanks  Waunita Schooner

## 2019-07-13 NOTE — Telephone Encounter (Signed)
Dr. Alva Garnet please see below message and advise. Thanks

## 2019-07-13 NOTE — Telephone Encounter (Signed)
Pt is aware of below recommendations and voiced her understanding. Nothing further is needed.  

## 2019-07-19 ENCOUNTER — Other Ambulatory Visit: Payer: Self-pay

## 2019-07-19 ENCOUNTER — Ambulatory Visit
Admission: RE | Admit: 2019-07-19 | Discharge: 2019-07-19 | Disposition: A | Discharge status: Home / Self Care | Payer: Medicare Other | Source: Ambulatory Visit | Attending: Orthopedic Surgery | Admitting: Orthopedic Surgery

## 2019-07-19 DIAGNOSIS — Z9889 Other specified postprocedural states: Secondary | ICD-10-CM | POA: Diagnosis not present

## 2019-07-19 DIAGNOSIS — S22070A Wedge compression fracture of T9-T10 vertebra, initial encounter for closed fracture: Secondary | ICD-10-CM

## 2019-07-22 ENCOUNTER — Other Ambulatory Visit: Payer: Self-pay

## 2019-07-22 ENCOUNTER — Other Ambulatory Visit
Admission: RE | Admit: 2019-07-22 | Discharge: 2019-07-22 | Disposition: A | Discharge status: Home / Self Care | Payer: Medicare Other | Source: Ambulatory Visit | Attending: Orthopedic Surgery | Admitting: Orthopedic Surgery

## 2019-07-22 DIAGNOSIS — Z01812 Encounter for preprocedural laboratory examination: Secondary | ICD-10-CM | POA: Diagnosis not present

## 2019-07-22 DIAGNOSIS — Z20828 Contact with and (suspected) exposure to other viral communicable diseases: Secondary | ICD-10-CM | POA: Diagnosis not present

## 2019-07-22 LAB — SARS CORONAVIRUS 2 (TAT 6-24 HRS): SARS Coronavirus 2: NEGATIVE

## 2019-07-25 ENCOUNTER — Encounter
Admission: RE | Admit: 2019-07-25 | Discharge: 2019-07-25 | Disposition: A | Discharge status: Home / Self Care | Payer: Medicare Other | Source: Ambulatory Visit | Attending: Orthopedic Surgery | Admitting: Orthopedic Surgery

## 2019-07-25 ENCOUNTER — Other Ambulatory Visit: Payer: Self-pay

## 2019-07-25 NOTE — Patient Instructions (Signed)
Your procedure is scheduled on: 07/26/19  Arrival time 1030 Report to Day Surgery. [facility] SECOND FLOOR .  Remember: Instructions that are not followed completely may result in serious medical risk,  up to and including death, or upon the discretion of your surgeon and anesthesiologist your  surgery may need to be rescheduled.     _X__ 1. Do not eat food after midnight the night before your procedure.                 No gum chewing or hard candies. You may drink clear liquids up to 2 hours                 before you are scheduled to arrive for your surgery- DO not drink clear                 liquids within 2 hours of the start of your surgery.                 Clear Liquids include:  water, apple juice without pulp, clear carbohydrate                 drink such as Clearfast of Gatorade, Black Coffee or Tea (Do not add                 anything to coffee or tea).  __X__2.  On the morning of surgery brush your teeth with toothpaste and water, you                may rinse your mouth with mouthwash if you wish.  Do not swallow any toothpaste of mouthwash.     _X__ 3.  No Alcohol for 24 hours before or after surgery.   _X__ 4.  Do Not Smoke or use e-cigarettes For 24 Hours Prior to Your Surgery.                 Do not use any chewable tobacco products for at least 6 hours prior to                 surgery.  ____  5.  Bring all medications with you on the day of surgery if instructed.   __X__  6.  Notify your doctor if there is any change in your medical condition      (cold, fever, infections).     Do not wear jewelry, make-up, hairpins, clips or nail polish. Do not wear lotions, powders, or perfumes. You may wear deodorant. Do not shave 48 hours prior to surgery. Men may shave face and neck. Do not bring valuables to the hospital.    [facility] is not responsible for any belongings or valuables.  Contacts, dentures or bridgework may not be worn into  surgery. Leave your suitcase in the car. After surgery it may be brought to your room. For patients admitted to the hospital, discharge time is determined by your treatment team.   Patients discharged the day of surgery will not be allowed to drive home.           __X__ Take these medicines the morning of surgery with A SIP OF WATER:    1. PLAQUENIL  2. AMLODIPINE  3. DULOXETINE  4. GABAPENTIN  5. PREDNISONE  6.  ____ Fleet Enema (as directed)   ____ Use CHG Soap as directed  __X__ Use inhalers on the day of surgery     AND BRING TO HOSPITAL  ____ Stop metformin  2 days prior to surgery    ____ Take 1/2 of usual insulin dose the night before surgery. No insulin the morning          of surgery.   __X__ Stop Coumadin/Plavix/aspirin on     DO NOT TAKE ASPIRIN 07/25/19 PM  ____ Stop Anti-inflammatories on    ____ Stop supplements until after surgery.    ____ Bring C-Pap to the hospital.

## 2019-07-26 ENCOUNTER — Encounter: Payer: Self-pay | Admitting: *Deleted

## 2019-07-26 ENCOUNTER — Ambulatory Visit: Payer: Medicare Other

## 2019-07-26 ENCOUNTER — Encounter
Admission: RE | Disposition: A | Discharge status: Home / Self Care | Payer: Self-pay | Source: Home / Self Care | Attending: Orthopedic Surgery

## 2019-07-26 ENCOUNTER — Other Ambulatory Visit: Payer: Self-pay

## 2019-07-26 ENCOUNTER — Ambulatory Visit: Payer: Medicare Other | Admitting: Anesthesiology

## 2019-07-26 ENCOUNTER — Ambulatory Visit
Admission: RE | Admit: 2019-07-26 | Discharge: 2019-07-26 | Disposition: A | Discharge status: Home / Self Care | Payer: Medicare Other | Attending: Orthopedic Surgery | Admitting: Orthopedic Surgery

## 2019-07-26 DIAGNOSIS — Z9889 Other specified postprocedural states: Secondary | ICD-10-CM | POA: Insufficient documentation

## 2019-07-26 DIAGNOSIS — I7300 Raynaud's syndrome without gangrene: Secondary | ICD-10-CM | POA: Diagnosis not present

## 2019-07-26 DIAGNOSIS — M8580 Other specified disorders of bone density and structure, unspecified site: Secondary | ICD-10-CM | POA: Diagnosis not present

## 2019-07-26 DIAGNOSIS — S22070A Wedge compression fracture of T9-T10 vertebra, initial encounter for closed fracture: Secondary | ICD-10-CM | POA: Diagnosis not present

## 2019-07-26 DIAGNOSIS — E785 Hyperlipidemia, unspecified: Secondary | ICD-10-CM | POA: Diagnosis not present

## 2019-07-26 DIAGNOSIS — X58XXXA Exposure to other specified factors, initial encounter: Secondary | ICD-10-CM | POA: Diagnosis not present

## 2019-07-26 DIAGNOSIS — K219 Gastro-esophageal reflux disease without esophagitis: Secondary | ICD-10-CM | POA: Insufficient documentation

## 2019-07-26 DIAGNOSIS — Z882 Allergy status to sulfonamides status: Secondary | ICD-10-CM | POA: Insufficient documentation

## 2019-07-26 DIAGNOSIS — M349 Systemic sclerosis, unspecified: Secondary | ICD-10-CM | POA: Insufficient documentation

## 2019-07-26 DIAGNOSIS — Z419 Encounter for procedure for purposes other than remedying health state, unspecified: Secondary | ICD-10-CM

## 2019-07-26 DIAGNOSIS — Z7982 Long term (current) use of aspirin: Secondary | ICD-10-CM | POA: Insufficient documentation

## 2019-07-26 DIAGNOSIS — Z888 Allergy status to other drugs, medicaments and biological substances status: Secondary | ICD-10-CM | POA: Diagnosis not present

## 2019-07-26 DIAGNOSIS — I340 Nonrheumatic mitral (valve) insufficiency: Secondary | ICD-10-CM | POA: Diagnosis not present

## 2019-07-26 DIAGNOSIS — Z79899 Other long term (current) drug therapy: Secondary | ICD-10-CM | POA: Diagnosis not present

## 2019-07-26 DIAGNOSIS — M069 Rheumatoid arthritis, unspecified: Secondary | ICD-10-CM | POA: Diagnosis not present

## 2019-07-26 HISTORY — PX: KYPHOPLASTY: SHX5884

## 2019-07-26 LAB — POCT I-STAT 4, (NA,K, GLUC, HGB,HCT)
Glucose, Bld: 83 mg/dL (ref 70–99)
HCT: 38 % (ref 36.0–46.0)
Hemoglobin: 12.9 g/dL (ref 12.0–15.0)
Potassium: 4.1 mmol/L (ref 3.5–5.1)
Sodium: 136 mmol/L (ref 135–145)

## 2019-07-26 SURGERY — KYPHOPLASTY
Anesthesia: Monitor Anesthesia Care | Site: Thoracic

## 2019-07-26 MED ORDER — FENTANYL CITRATE (PF) 100 MCG/2ML IJ SOLN
INTRAMUSCULAR | Status: AC
  Administered 2019-07-26: 14:00:00 25 ug via INTRAVENOUS
  Filled 2019-07-26: qty 2

## 2019-07-26 MED ORDER — HYDROCODONE-ACETAMINOPHEN 5-325 MG PO TABS
1.0000 | ORAL_TABLET | ORAL | Status: DC | PRN
  Administered 2019-07-26: 14:00:00 1 via ORAL

## 2019-07-26 MED ORDER — SODIUM CHLORIDE 0.9 % IV SOLN: INTRAVENOUS | Status: DC

## 2019-07-26 MED ORDER — FAMOTIDINE 20 MG PO TABS
20.0000 mg | ORAL_TABLET | Freq: Once | ORAL | Status: AC
  Administered 2019-07-26: 11:00:00 20 mg via ORAL

## 2019-07-26 MED ORDER — PROPOFOL 500 MG/50ML IV EMUL
INTRAVENOUS | Status: DC | PRN
  Administered 2019-07-26: 125 ug/kg/min via INTRAVENOUS

## 2019-07-26 MED ORDER — BUPIVACAINE-EPINEPHRINE (PF) 0.5% -1:200000 IJ SOLN
INTRAMUSCULAR | Status: AC
  Filled 2019-07-26: qty 30

## 2019-07-26 MED ORDER — METOCLOPRAMIDE HCL 5 MG/ML IJ SOLN: 5.0000 mg | Freq: Three times a day (TID) | INTRAMUSCULAR | Status: DC | PRN

## 2019-07-26 MED ORDER — CEFAZOLIN SODIUM-DEXTROSE 2-4 GM/100ML-% IV SOLN
2.0000 g | Freq: Once | INTRAVENOUS | Status: AC
  Administered 2019-07-26: 2 g via INTRAVENOUS

## 2019-07-26 MED ORDER — ONDANSETRON HCL 4 MG/2ML IJ SOLN: 4.0000 mg | Freq: Four times a day (QID) | INTRAMUSCULAR | Status: DC | PRN

## 2019-07-26 MED ORDER — ONDANSETRON HCL 4 MG PO TABS: 4.0000 mg | ORAL_TABLET | Freq: Four times a day (QID) | ORAL | Status: DC | PRN

## 2019-07-26 MED ORDER — FAMOTIDINE 20 MG PO TABS
ORAL_TABLET | ORAL | Status: AC
  Administered 2019-07-26: 11:00:00 20 mg via ORAL
  Filled 2019-07-26: qty 1

## 2019-07-26 MED ORDER — METOCLOPRAMIDE HCL 10 MG PO TABS: 5.0000 mg | ORAL_TABLET | Freq: Three times a day (TID) | ORAL | Status: DC | PRN

## 2019-07-26 MED ORDER — ACETAMINOPHEN 500 MG PO TABS: 500.0000 mg | ORAL_TABLET | Freq: Four times a day (QID) | ORAL | Status: DC

## 2019-07-26 MED ORDER — LIDOCAINE HCL (PF) 1 % IJ SOLN
INTRAMUSCULAR | Status: AC
  Filled 2019-07-26: qty 30

## 2019-07-26 MED ORDER — MORPHINE SULFATE (PF) 4 MG/ML IV SOLN: 0.5000 mg | INTRAVENOUS | Status: DC | PRN

## 2019-07-26 MED ORDER — HYDROCODONE-ACETAMINOPHEN 5-325 MG PO TABS
ORAL_TABLET | ORAL | Status: AC
  Administered 2019-07-26: 14:00:00 1 via ORAL
  Filled 2019-07-26: qty 1

## 2019-07-26 MED ORDER — MIDAZOLAM HCL 2 MG/2ML IJ SOLN
INTRAMUSCULAR | Status: AC
  Filled 2019-07-26: qty 2

## 2019-07-26 MED ORDER — PROPOFOL 500 MG/50ML IV EMUL
INTRAVENOUS | Status: AC
  Filled 2019-07-26: qty 50

## 2019-07-26 MED ORDER — FENTANYL CITRATE (PF) 100 MCG/2ML IJ SOLN
INTRAMUSCULAR | Status: DC | PRN
  Administered 2019-07-26: 25 ug via INTRAVENOUS

## 2019-07-26 MED ORDER — FENTANYL CITRATE (PF) 100 MCG/2ML IJ SOLN
INTRAMUSCULAR | Status: AC
  Filled 2019-07-26: qty 2

## 2019-07-26 MED ORDER — MIDAZOLAM HCL 2 MG/2ML IJ SOLN
INTRAMUSCULAR | Status: DC | PRN
  Administered 2019-07-26: 2 mg via INTRAVENOUS

## 2019-07-26 MED ORDER — HYDROCODONE-ACETAMINOPHEN 5-325 MG PO TABS: 1.0000 | ORAL_TABLET | Freq: Four times a day (QID) | ORAL | 0 refills | Status: DC | PRN

## 2019-07-26 MED ORDER — ONDANSETRON HCL 4 MG/2ML IJ SOLN: 4.0000 mg | Freq: Once | INTRAMUSCULAR | Status: DC | PRN

## 2019-07-26 MED ORDER — PROPOFOL 10 MG/ML IV BOLUS
INTRAVENOUS | Status: DC | PRN
  Administered 2019-07-26: 50 mg via INTRAVENOUS

## 2019-07-26 MED ORDER — IOHEXOL 180 MG/ML  SOLN
INTRAMUSCULAR | Status: DC | PRN
  Administered 2019-07-26: 10 mL

## 2019-07-26 MED ORDER — LACTATED RINGERS IV SOLN
INTRAVENOUS | Status: DC
  Administered 2019-07-26: 11:00:00 via INTRAVENOUS

## 2019-07-26 MED ORDER — FENTANYL CITRATE (PF) 100 MCG/2ML IJ SOLN
25.0000 ug | INTRAMUSCULAR | Status: DC | PRN
  Administered 2019-07-26: 14:00:00 25 ug via INTRAVENOUS

## 2019-07-26 MED ORDER — LIDOCAINE HCL 1 % IJ SOLN
INTRAMUSCULAR | Status: DC | PRN
  Administered 2019-07-26: 10 mL

## 2019-07-26 MED ORDER — ACETAMINOPHEN 325 MG PO TABS: 325.0000 mg | ORAL_TABLET | Freq: Four times a day (QID) | ORAL | Status: DC | PRN

## 2019-07-26 MED ORDER — HYDROCODONE-ACETAMINOPHEN 7.5-325 MG PO TABS: 1.0000 | ORAL_TABLET | ORAL | Status: DC | PRN

## 2019-07-26 MED ORDER — BUPIVACAINE-EPINEPHRINE (PF) 0.5% -1:200000 IJ SOLN
INTRAMUSCULAR | Status: DC | PRN
  Administered 2019-07-26: 10 mL

## 2019-07-26 MED ORDER — CEFAZOLIN SODIUM-DEXTROSE 2-4 GM/100ML-% IV SOLN
INTRAVENOUS | Status: AC
  Filled 2019-07-26: qty 100

## 2019-07-26 SURGICAL SUPPLY — 20 items
CEMENT KYPHON CX01A KIT/MIXER (Cement) ×2 IMPLANT
COVER WAND RF STERILE (DRAPES) ×2 IMPLANT
DERMABOND ADVANCED (GAUZE/BANDAGES/DRESSINGS) ×1
DERMABOND ADVANCED .7 DNX12 (GAUZE/BANDAGES/DRESSINGS) ×1 IMPLANT
DEVICE BIOPSY BONE KYPH (INSTRUMENTS) ×2 IMPLANT
DEVICE BIOPSY BONE KYPHX (INSTRUMENTS) ×2 IMPLANT
DRAPE C-ARM XRAY 36X54 (DRAPES) ×2 IMPLANT
DURAPREP 26ML APPLICATOR (WOUND CARE) ×2 IMPLANT
GLOVE SURG SYN 9.0  PF PI (GLOVE) ×1
GLOVE SURG SYN 9.0 PF PI (GLOVE) ×1 IMPLANT
GOWN SRG 2XL LVL 4 RGLN SLV (GOWNS) ×1 IMPLANT
GOWN STRL NON-REIN 2XL LVL4 (GOWNS) ×1
GOWN STRL REUS W/ TWL LRG LVL3 (GOWN DISPOSABLE) ×1 IMPLANT
GOWN STRL REUS W/TWL LRG LVL3 (GOWN DISPOSABLE) ×1
PACK KYPHOPLASTY (MISCELLANEOUS) ×2 IMPLANT
RENTAL RFA GENERATOR (MISCELLANEOUS) IMPLANT
STRAP SAFETY 5IN WIDE (MISCELLANEOUS) ×2 IMPLANT
TRAY KYPHOPAK 15/2 EXPRESS (KITS) ×2 IMPLANT
TRAY KYPHOPAK 15/3 EXPRESS 1ST (MISCELLANEOUS) IMPLANT
TRAY KYPHOPAK 20/3 EXPRESS 1ST (MISCELLANEOUS) IMPLANT

## 2019-07-26 NOTE — H&P (Signed)
Reviewed paper H+P, will be scanned into chart. No changes noted.  

## 2019-07-26 NOTE — Anesthesia Preprocedure Evaluation (Signed)
Anesthesia Evaluation  Patient identified by MRN, date of birth, ID band Patient awake    Reviewed: Allergy & Precautions, H&P , NPO status , Patient's Chart, lab work & pertinent test results, reviewed documented beta blocker date and time   History of Anesthesia Complications (+) history of anesthetic complications  Airway Mallampati: II  TM Distance: >3 FB Neck ROM: full    Dental no notable dental hx. (+) Teeth Intact   Pulmonary shortness of breath and with exertion,    Pulmonary exam normal breath sounds clear to auscultation       Cardiovascular Exercise Tolerance: Poor hypertension, On Medications + angina with exertion + [name] and +CHF  + Valvular Problems/Murmurs  Rhythm:regular Rate:Normal     Neuro/Psych negative neurological ROS  negative psych ROS   GI/Hepatic Neg liver ROS, PUD, GERD  Medicated,  Endo/Other  negative endocrine ROSdiabetes  Renal/GU      Musculoskeletal   Abdominal   Peds  Hematology  (+) Blood dyscrasia, anemia ,   Anesthesia Other Findings   Reproductive/Obstetrics negative OB ROS                             Anesthesia Physical Anesthesia Plan  ASA: IV  Anesthesia Plan: MAC   Post-op Pain Management:    Induction:   PONV Risk Score and Plan:   Airway Management Planned:   Additional Equipment:   Intra-op Plan:   Post-operative Plan:   Informed Consent: I have reviewed the patients History and Physical, chart, labs and discussed the procedure including the risks, benefits and alternatives for the proposed anesthesia with the patient or authorized representative who has indicated his/her understanding and acceptance.       Plan Discussed with: CRNA  Anesthesia Plan Comments:         Anesthesia Quick Evaluation

## 2019-07-26 NOTE — Anesthesia Post-op Follow-up Note (Signed)
Anesthesia QCDR form completed.        

## 2019-07-26 NOTE — Transfer of Care (Signed)
Immediate Anesthesia Transfer of Care Note  Patient: Erin [name]  Procedure(s) Performed: T-9 KYPHOPLASTY (N/A Thoracic)  Patient Location: PACU  Anesthesia Type:General  Level of Consciousness: awake, alert  and oriented  Airway & Oxygen Therapy: Patient Spontanous Breathing and Patient connected to nasal cannula oxygen  Post-op Assessment: Report given to RN and Post -op Vital signs reviewed and stable  Post vital signs: Reviewed and stable  Last Vitals:  Vitals Value Taken Time  BP 114/49 07/26/19 1325  Temp 36.3 C 07/26/19 1325  Pulse 80 07/26/19 1326  Resp 19 07/26/19 1326  SpO2 97 % 07/26/19 1326  Vitals shown include unvalidated device data.  Last Pain:  Vitals:   07/26/19 1325  TempSrc:   PainSc: 0-No pain         Complications: No apparent anesthesia complications

## 2019-07-26 NOTE — Op Note (Signed)
Date July 26, 2019  time 1:23 PM   PATIENT:  Erin [name]   PRE-OPERATIVE DIAGNOSIS:  closed wedge compression fracture of T9   POST-OPERATIVE DIAGNOSIS:  closed wedge compression fracture of T9   PROCEDURE:  Procedure(s): KYPHOPLASTY T9  SURGEON: Laurene Footman, MD   ASSISTANTS: None   ANESTHESIA:   local and MAC   EBL:  No intake/output data recorded.   BLOOD ADMINISTERED:none   DRAINS: none    LOCAL MEDICATIONS USED:  MARCAINE    and XYLOCAINE    SPECIMEN:   None   DISPOSITION OF SPECIMEN:  Not applicable   COUNTS:  YES   TOURNIQUET:  * No tourniquets in log *   IMPLANTS: Bone cement   DICTATION: .Dragon Dictation  patient was brought to the operating room and after adequate anesthesia was obtained the patient was placed prone.  C arm was brought in in good visualization of the affected level obtained on both AP and lateral projections.  After patient identification and timeout procedures were completed, local anesthetic was infiltrated with 10 cc 1% Xylocaine infiltrated subcutaneously.  This is done the area on the right side of the planned approach.  The back was then prepped and draped in the usual sterile manner and repeat timeout procedure carried out.  A spinal needle was brought down to the pedicle on the right side of  T9 and a 50-50 mix of 1% Xylocaine half percent Sensorcaine with epinephrine total of 20 cc injected.  After allowing this to set a small incision was made and the trocar was advanced into the vertebral body in an extrapedicular fashion.  Biopsy was not obtained Drilling was carried out balloon inserted with inflation to  1-1/2 cc.  When the cement was appropriate consistency 2 cc were injected into the vertebral body without extravasation, good fill superior to inferior endplates and from right to left sides along the inferior endplate.  After the cement had set the trochar was removed and permanent C-arm views obtained.  The wound was closed with  Dermabond followed by Band-Aid   PLAN OF CARE: Discharge to home after PACU   PATIENT DISPOSITION:  PACU - hemodynamically stable.

## 2019-07-26 NOTE — Discharge Instructions (Addendum)
AMBULATORY SURGERY  DISCHARGE INSTRUCTIONS   1) The drugs that you were given will stay in your system until tomorrow so for the next 24 hours you should not:  A) Drive an automobile B) Make any legal decisions C) Drink any alcoholic beverage   2) You may resume regular meals tomorrow.  Today it is better to start with liquids and gradually work up to solid foods.  You may eat anything you prefer, but it is better to start with liquids, then soup and crackers, and gradually work up to solid foods.   3) Please notify your doctor immediately if you have any unusual bleeding, trouble breathing, redness and pain at the surgery site, drainage, fever, or pain not relieved by medication.    4) Additional Instructions:        Please contact your physician with any problems or Same Day Surgery at [phone], Monday through Friday 6 am to 4 pm, or [place] at [facility] number at [phone].    Remove Band-Aid in 2 days.  Okay to shower after that.  Take it easy today and tomorrow and resume more normal activities on Thursday.  Try not to lift anything over 5 pounds for 2 weeks.  Call office if having problems.

## 2019-08-08 ENCOUNTER — Other Ambulatory Visit: Payer: Self-pay | Admitting: Internal Medicine

## 2019-08-08 DIAGNOSIS — Z1231 Encounter for screening mammogram for malignant neoplasm of breast: Secondary | ICD-10-CM

## 2019-08-08 NOTE — Anesthesia Postprocedure Evaluation (Signed)
Anesthesia Post Note  Patient: Erin [name]  Procedure(s) Performed: T-9 KYPHOPLASTY (N/A Thoracic)  Patient location during evaluation: PACU Anesthesia Type: MAC Level of consciousness: awake and alert Pain management: pain level controlled Vital Signs Assessment: post-procedure vital signs reviewed and stable Respiratory status: spontaneous breathing, nonlabored ventilation, respiratory function stable and patient connected to nasal cannula oxygen Cardiovascular status: blood pressure returned to baseline and stable Postop Assessment: no apparent nausea or vomiting Anesthetic complications: no     Last Vitals:  Vitals:   07/26/19 1422 07/26/19 1438  BP: (!) 134/56 129/63  Pulse: 72 73  Resp: 16 16  Temp: (!) 36.3 C   SpO2: 95% 97%    Last Pain:  Vitals:   07/27/19 0935  TempSrc:   PainSc: 0-No pain                 Molli Barrows

## 2019-08-31 ENCOUNTER — Encounter: Payer: Self-pay | Admitting: Obstetrics and Gynecology

## 2019-08-31 ENCOUNTER — Telehealth: Payer: Self-pay

## 2019-08-31 ENCOUNTER — Inpatient Hospital Stay: Payer: Medicare Other | Attending: Obstetrics and Gynecology | Admitting: Obstetrics and Gynecology

## 2019-08-31 ENCOUNTER — Ambulatory Visit: Payer: Medicare Other

## 2019-08-31 ENCOUNTER — Other Ambulatory Visit: Payer: Self-pay

## 2019-08-31 VITALS — BP 135/62 | HR 108 | Temp 98.2°F | Wt 168.8 lb

## 2019-08-31 DIAGNOSIS — I110 Hypertensive heart disease with heart failure: Secondary | ICD-10-CM | POA: Insufficient documentation

## 2019-08-31 DIAGNOSIS — I340 Nonrheumatic mitral (valve) insufficiency: Secondary | ICD-10-CM | POA: Diagnosis not present

## 2019-08-31 DIAGNOSIS — N6019 Diffuse cystic mastopathy of unspecified breast: Secondary | ICD-10-CM | POA: Diagnosis not present

## 2019-08-31 DIAGNOSIS — E785 Hyperlipidemia, unspecified: Secondary | ICD-10-CM | POA: Insufficient documentation

## 2019-08-31 DIAGNOSIS — N9089 Other specified noninflammatory disorders of vulva and perineum: Secondary | ICD-10-CM

## 2019-08-31 DIAGNOSIS — Z85820 Personal history of malignant melanoma of skin: Secondary | ICD-10-CM | POA: Insufficient documentation

## 2019-08-31 DIAGNOSIS — I2510 Atherosclerotic heart disease of native coronary artery without angina pectoris: Secondary | ICD-10-CM | POA: Diagnosis not present

## 2019-08-31 DIAGNOSIS — M5136 Other intervertebral disc degeneration, lumbar region: Secondary | ICD-10-CM | POA: Insufficient documentation

## 2019-08-31 DIAGNOSIS — M069 Rheumatoid arthritis, unspecified: Secondary | ICD-10-CM | POA: Insufficient documentation

## 2019-08-31 DIAGNOSIS — K224 Dyskinesia of esophagus: Secondary | ICD-10-CM | POA: Insufficient documentation

## 2019-08-31 DIAGNOSIS — R011 Cardiac murmur, unspecified: Secondary | ICD-10-CM | POA: Diagnosis not present

## 2019-08-31 DIAGNOSIS — K219 Gastro-esophageal reflux disease without esophagitis: Secondary | ICD-10-CM | POA: Diagnosis not present

## 2019-08-31 DIAGNOSIS — I25118 Atherosclerotic heart disease of native coronary artery with other forms of angina pectoris: Secondary | ICD-10-CM | POA: Diagnosis not present

## 2019-08-31 DIAGNOSIS — M8580 Other specified disorders of bone density and structure, unspecified site: Secondary | ICD-10-CM | POA: Insufficient documentation

## 2019-08-31 DIAGNOSIS — I7300 Raynaud's syndrome without gangrene: Secondary | ICD-10-CM | POA: Insufficient documentation

## 2019-08-31 DIAGNOSIS — Z7982 Long term (current) use of aspirin: Secondary | ICD-10-CM | POA: Diagnosis not present

## 2019-08-31 DIAGNOSIS — D071 Carcinoma in situ of vulva: Secondary | ICD-10-CM | POA: Insufficient documentation

## 2019-08-31 DIAGNOSIS — Z9861 Coronary angioplasty status: Secondary | ICD-10-CM | POA: Insufficient documentation

## 2019-08-31 DIAGNOSIS — R1310 Dysphagia, unspecified: Secondary | ICD-10-CM | POA: Insufficient documentation

## 2019-08-31 DIAGNOSIS — K210 Gastro-esophageal reflux disease with esophagitis: Secondary | ICD-10-CM | POA: Diagnosis not present

## 2019-08-31 DIAGNOSIS — M5116 Intervertebral disc disorders with radiculopathy, lumbar region: Secondary | ICD-10-CM | POA: Diagnosis not present

## 2019-08-31 DIAGNOSIS — K635 Polyp of colon: Secondary | ICD-10-CM | POA: Insufficient documentation

## 2019-08-31 DIAGNOSIS — M349 Systemic sclerosis, unspecified: Secondary | ICD-10-CM | POA: Insufficient documentation

## 2019-08-31 DIAGNOSIS — Z9109 Other allergy status, other than to drugs and biological substances: Secondary | ICD-10-CM | POA: Insufficient documentation

## 2019-08-31 MED ORDER — FLUCONAZOLE 150 MG PO TABS: 150.0000 mg | ORAL_TABLET | Freq: Every day | ORAL | 0 refills | Status: DC

## 2019-08-31 NOTE — Telephone Encounter (Signed)
Pre-visit assessment attempted prior to [place] appointment on 08/31/2019. No answer / message was left on voicemail.

## 2019-08-31 NOTE — Progress Notes (Signed)
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 female s/p TAH, seen in consultation from Dr. Kenton Kingfisher for [place]. Dr. Bary [name] initially referred Erin [name] to Dr. Kenton Kingfisher. She returns to clinic today for continued surveillance.   She had a very challenging year and was diagnosed with COVID. She also was recently on an antibiotic. She complaints of vulvar irritation, especially along the left vulvar scar.    Gynecologic Oncology History Erin [name] is a pleasant female s/p TAH who is seen in consultation from Dr. Kenton Kingfisher for [place]. Dr. Bary [name] initially referred Erin [name] to Dr. Kenton Kingfisher.   The patient underwent left leg wide excision with sentinel node biopsy for melanoma and excision left labial nodule on 11/19/2016. Based on pathology report the vulvar nodule was 1 x 0.8 cm and the entire specimen removed was 1.5 x 0.9 x 0.3 cm. She complains of persistent irritation/itching and has been using clobesetrol ointment for years.    Data Reviewed DIAGNOSIS:  A. SKIN AND SOFT TISSUE, LEFT THIGH; EXCISION:  - NEGATIVE FOR RESIDUAL MELANOMA.  - BIOPSY SITE CHANGES.  - ALL MARGINS APPEAR CLEAR.   B. SENTINEL LYMPH NODE #1, LEFT INGUINAL; EXCISION:  - NEGATIVE FOR MALIGNANCY BY IMMUNOHISTOCHEMISTRY FOR SOX10.   C. LEFT LABIAL NODULE; EXCISION:  - HIGH-GRADE SQUAMOUS INTRAEPITHELIAL LESION (HSIL, VIN 3).   Comment:  In part C, there is HSIL 0.5 mm from a lateral margin. HSIL is present  in one tip section, so this margin is very close also. Complete  gynecologic exam for other HPV-associated lesions and close follow-up  are advised.  She recently had colonoscopy and EGD on 01/20/2017; pathology colonic polyps and GE junction lesion inflamed granulation tissue Barrett's esophagus can not be excluded. Noted to have esophageal lesion and endoscopic ultrasound  ordered on 01/29/2017.  01/28/2017 she had vulvar colposcopy and biopsy that demonstrated  DIAGNOSIS:  A. VULVA, LEFT, 5:00:  - VULVAR TISSUE WITH SCANT INFLAMMATION.  - NEGATIVE FOR DYSPLASIA AND MALIGNANCY.  - MULTIPLE DEEPER LEVELS WERE EXAMINED.  Of note she does not have a history of abnormal Pap, genital warts, or other gynecologic related issues.   She had a h/o chronic irritation but otherwise NED.   Problem List: Patient Active Problem List   Diagnosis Date Noted  . Colon polyp 08/31/2019  . Coronary artery calcification 08/31/2019  . Degenerative disc disease, lumbar 08/31/2019  . Dysphagia 08/31/2019  . Environmental allergies 08/31/2019  . Esophageal dysmotility 08/31/2019  . Fibrocystic breast disease 08/31/2019  . Mitral regurgitation 08/31/2019  . Acute respiratory disease due to COVID-19 virus 05/28/2019  . Food in esophagus causing other injury, initial encounter 04/21/2019  . Gastro-esophageal reflux disease with esophagitis 04/21/2019  . Gastric nodule 04/21/2019  . Varicose veins of leg with swelling, bilateral 06/25/2018  . Acute diastolic CHF (congestive heart failure) ([place]) 03/18/2018  . Lower extremity edema 03/18/2018  . Age-related osteoporosis with current pathological fracture 03/04/2018  . Symptomatic anemia 02/28/2018  . S/P hip hemiarthroplasty 02/26/2018  . Hip fracture ([place]) 02/22/2018  . Lumbar radiculopathy 01/20/2018  . Polyneuropathy 01/20/2018  . Chronic insomnia 01/15/2018  . Hyperlipidemia 10/26/2017  . Coronary artery disease of native artery of native heart with stable angina pectoris ([facility]) 10/24/2017  . Vulvar irritation 09/02/2017  . 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 High grade squamous intraepithelial lesion on cytologic smear of  vagina (HGSIL) 12/16/2016  . SOB (shortness of breath) 12/15/2016  . Idiopathic interstitial fibrosis of lung syndrome ([place]) 12/15/2016  . Raynaud's disease 12/15/2016  . Melanoma of skin ([place]) 11/11/2016   . Lesion of labia 11/11/2016  . HTN, goal below 140/90 08/29/2014  . Encounter for long-term current use of medication 03/30/2014  . Scleroderma ([place]) 03/15/2014  . Rheumatoid arthritis ([place]) 03/15/2014  . Osteopenia 03/15/2014    Past Medical History: Past Medical History:  Diagnosis Date  . Anemia    h/o  . Cancer ([place])    melanoma x 3the last one was of her leg and surgeon removed it  . Colon polyp   . Complication of anesthesia    PT STATED THAT WITH JOINT SURGERY THAT SHE WAS GIVEN SOME TYPE OF ANESTHESIA THAT MADE HER HALLUCINATE   . Coronary artery calcification   . Coronary artery disease   . DDD (degenerative disc disease), lumbar   . Diverticulosis 01/20/2017  . Dysphagia   . Dyspnea    with exertion-unable to walk a mile without getting sob- dr sparks set pt up to see Dr Rockey Situ after 11-19-16 surgery  . Environmental allergies   . Esophageal dysmotility   . Fibrocystic breast disease   . GERD (gastroesophageal reflux disease)   . Heart murmur    asymptomatic  . Hyperlipidemia    unspecified  . Hypertension   . Mitral regurgitation   . Monilial esophagitis ([place]) 01/20/2017  . Obstipation   . Osteopenia   . Peptic ulcer disease   . Pulmonary fibrosis ([place])    per Dr Raul Del  . RA (rheumatoid arthritis) ([place])   . Raynaud's disease   . Redundant colon 01/20/2017  . Scleroderma ([place])   . Telangiectasia of colon   . Tubular adenoma of colon 01/20/2017   unspecified  . Vulvar dysplasia     Past Surgical History: Past Surgical History:  Procedure Laterality Date  . ABDOMINAL HYSTERECTOMY    . BREAST CYST EXCISION Left 20+ years ago   No scar visible  . BREAST LUMPECTOMY Right   . CARPAL TUNNEL RELEASE Bilateral   . COLON SURGERY     colon polyp   . COLONOSCOPY  09/30/2011   tubular adenoma rtm  . COLONOSCOPY     05/02/2003, 04/10/2000  . COLONOSCOPY WITH PROPOFOL N/A 01/20/2017   Procedure: COLONOSCOPY WITH PROPOFOL;  Surgeon: Lollie Sails, MD;   Location: [facility] ENDOSCOPY;  Service: Endoscopy;  Laterality: N/A;  . ESOPHAGOGASTRODUODENOSCOPY    . ESOPHAGOGASTRODUODENOSCOPY     09/30/2011, 04/10/2000 , no repeat rtm  . ESOPHAGOGASTRODUODENOSCOPY N/A 03/02/2018   Procedure: ESOPHAGOGASTRODUODENOSCOPY (EGD);  Surgeon: Virgel Manifold, MD;  Location[facility] ENDOSCOPY;  Service: Endoscopy;  Laterality: N/A;  . ESOPHAGOGASTRODUODENOSCOPY (EGD) WITH PROPOFOL N/A 01/20/2017   Procedure: ESOPHAGOGASTRODUODENOSCOPY (EGD) WITH PROPOFOL;  Surgeon: Lollie Sails, MD;  Location[facility] ENDOSCOPY;  Service: Endoscopy;  Laterality: N/A;  . EXCISION HYDRADENITIS LABIA Left 11/19/2016   Procedure: EXCISION LABIAL MASS;  Surgeon: Robert Bellow, MD;  Location: [facility] ORS;  Service: General;  Laterality: Left;  . EYE SURGERY Bilateral    cataracts  . HEEL SPUR EXCISION    . HIP ARTHROPLASTY Left 02/23/2018   Procedure: ARTHROPLASTY BIPOLAR HIP (HEMIARTHROPLASTY);  Surgeon: Thornton Park, MD;  Location: [facility] ORS;  Service: Orthopedics;  Laterality: Left;  . KYPHOPLASTY N/A 04/26/2019   Procedure: KYPHOPLASTY T7;  Surgeon: Hessie Knows, MD;  Location: [facility] ORS;  Service: Orthopedics;  Laterality: N/A;  . KYPHOPLASTY  N/A 05/17/2019   Procedure: KYPHOPLASTY T8;  Surgeon: Hessie Knows, MD;  Location: [facility] ORS;  Service: Orthopedics;  Laterality: N/A;  . KYPHOPLASTY N/A 07/26/2019   Procedure: T-9 KYPHOPLASTY;  Surgeon: Hessie Knows, MD;  Location: [facility] ORS;  Service: Orthopedics;  Laterality: N/A;  . MASS EXCISION Left 11/19/2016   Procedure: EXCISION LEFT THIGH MELANOMA;  Surgeon: Robert Bellow, MD;  Location: [facility] ORS;  Service: General;  Laterality: Left;  . SENTINEL NODE BIOPSY Left 11/19/2016   Procedure: INGUINAL SENTINEL NODE BIOPSY;  Surgeon: Robert Bellow, MD;  Location: [facility] ORS;  Service: General;  Laterality: Left;  . UPPER ESOPHAGEAL ENDOSCOPIC ULTRASOUND (EUS) N/A 01/29/2017   Procedure: UPPER ESOPHAGEAL ENDOSCOPIC ULTRASOUND (EUS);   Surgeon: Holly Bodily, MD;  Location[facility] ENDOSCOPY;  Service: Endoscopy;  Laterality: N/A;    Past Gynecologic History:  As per HPI  OB History:  OB History  Gravida Para Term Preterm AB Living  3 3          SAB TAB Ectopic Multiple Live Births               # Outcome Date GA Lbr Len/2nd Weight Sex Delivery Anes PTL Lv  3 Para           2 Para           1 Para             Family History: Family History  Problem Relation Age of Onset  . Endometrial cancer Mother 63  . Hypertension Mother   . Osteoporosis Mother   . Bladder Cancer Father   . COPD Father   . Cerebral palsy Sister     Social History: Social History   Socioeconomic History  . Marital status: Widowed    Spouse name: Not on file  . Number of children: Not on file  . Years of education: Not on file  . Highest education level: Not on file  Occupational History  . Occupation: retired  Scientific laboratory technician  . Financial resource strain: Not on file  . Food insecurity    Worry: Not on file    Inability: Not on file  . Transportation needs    Medical: Not on file    Non-medical: Not on file  Tobacco Use  . Smoking status: Never Smoker  . Smokeless tobacco: Never Used  Substance and Sexual Activity  . Alcohol use: No  . Drug use: No  . Sexual activity: Not on file  Lifestyle  . Physical activity    Days per week: Not on file    Minutes per session: Not on file  . Stress: Not on file  Relationships  . Social Herbalist on phone: Not on file    Gets together: Not on file    Attends religious service: Not on file    Active member of club or organization: Not on file    Attends meetings of clubs or organizations: Not on file    Relationship status: Not on file  . Intimate partner violence    Fear of current or ex partner: Not on file    Emotionally abused: Not on file    Physically abused: Not on file    Forced sexual activity: Not on file  Other Topics Concern  . Not on file   Social History Narrative  . Not on file    Allergies: Allergies  Allergen Reactions  . Remicade [Infliximab] Shortness Of Breath and Itching  .  Sulfa Antibiotics Other (See Comments)    Other Reaction: tounge cracked  . Actonel [Risedronate Sodium] Other (See Comments)  . Fosamax [Alendronate Sodium]     GI Bleed   . Procardia [Nifedipine] Hives  . Sulfur Swelling  . Wellbutrin [Bupropion] Anxiety    Current Medications: Current Outpatient Medications  Medication Sig Dispense Refill  . albuterol (VENTOLIN HFA) 108 (90 Base) MCG/ACT inhaler Inhale 2 puffs into the lungs every 4 (four) hours as needed for wheezing or shortness of breath.    . ALPRAZolam (XANAX) 0.5 MG tablet Take 1 tablet (0.5 mg total) by mouth 4 (four) times daily as needed for anxiety. 20 tablet 0  . amLODipine (NORVASC) 5 MG tablet Take 5 mg by mouth 2 (two) times daily.    Marland Kitchen aspirin 81 MG tablet Take 81 mg by mouth daily.     . cephALEXin (KEFLEX) 500 MG capsule cephalexin 500 mg capsule    . clobetasol ointment (TEMOVATE) 0.05 % clobetasol 0.05 % topical ointment    . cyclobenzaprine (FLEXERIL) 5 MG tablet cyclobenzaprine 5 mg tablet    . dexlansoprazole (DEXILANT) 60 MG capsule Take 1 capsule (60 mg total) by mouth daily. (Patient taking differently: Take 60 mg by mouth at bedtime. ) 30 capsule 0  . doxycycline (MONODOX) 100 MG capsule doxycycline monohydrate 100 mg capsule    . DULoxetine (CYMBALTA) 30 MG capsule Take 60 mg by mouth daily. In the morning.    . ezetimibe (ZETIA) 10 MG tablet Take 1 tablet (10 mg total) by mouth daily. (Patient taking differently: Take 10 mg by mouth at bedtime. ) 90 tablet 3  . Fluocinolone Acetonide Body 0.01 % OIL Apply 1 application topically as needed.    . furosemide (LASIX) 20 MG tablet Take 20-40 mg by mouth daily.     Marland Kitchen gabapentin (NEURONTIN) 300 MG capsule Take 300 mg by mouth 3 (three) times daily.     Marland Kitchen HYDROcodone-acetaminophen (NORCO) 5-325 MG tablet Take 1  tablet by mouth every 6 (six) hours as needed for moderate pain. 30 tablet 0  . hydroxychloroquine (PLAQUENIL) 200 MG tablet Take 200 mg by mouth 2 (two) times daily.     Marland Kitchen latanoprost (XALATAN) 0.005 % ophthalmic solution Place 1 drop into both eyes at bedtime.     Marland Kitchen levofloxacin (LEVAQUIN) 500 MG tablet levofloxacin 500 mg tablet    . methocarbamol (ROBAXIN) 500 MG tablet Take 500 mg by mouth 4 (four) times daily.    . methylPREDNISolone (MEDROL DOSEPAK) 4 MG TBPK tablet Take 1 Package by mouth once.    . Multiple Vitamin (MULTIVITAMIN WITH MINERALS) TABS tablet Take 1 tablet by mouth daily.    Marland Kitchen OFEV 100 MG CAPS Take 100 mg by mouth daily.    Marland Kitchen oxyCODONE-acetaminophen (PERCOCET) 7.5-325 MG tablet Take 1 tablet by mouth every 4 (four) hours as needed for severe pain. 20 tablet 0  . polyethylene glycol (MIRALAX / GLYCOLAX) packet Take 17 g by mouth daily as needed (constipation.).     Marland Kitchen predniSONE (DELTASONE) 5 MG tablet Take 1 tablet (5 mg total) by mouth 2 (two) times daily with a meal. RESUME AFTER COMPLETING THE PREDNISONE TAPER ORDERED SEPARATELY (Patient taking differently: Take 5 mg by mouth daily with breakfast. )    . QUEtiapine (SEROQUEL) 25 MG tablet quetiapine 25 mg tablet    . simvastatin (ZOCOR) 80 MG tablet Take 80 mg by mouth every evening.    . zolpidem (AMBIEN) 10 MG tablet Take 1 tablet (  10 mg total) by mouth at bedtime as needed for sleep. 10 tablet 0  . Zoster Vaccine Adjuvanted [facility]) injection Shingrix (PF) 50 mcg/0.5 mL intramuscular suspension, kit     No current facility-administered medications for this visit.     Review of Systems General:  Fatigue, weakness Skin: no complaints Eyes: no complaints HEENT: no complaints Breasts: no complaints Pulmonary: sob; hx of covid-19 infection Cardiac: no complaints Gastrointestinal: no complaints Genitourinary/Sexual: no complaints Ob/Gyn: no complaints Musculoskeletal: back pain Hematology: no  complaints Neurologic/Psych: depression    Objective:  Physical Examination:  BP 135/62   Pulse (!) 108   Temp 98.2 F (36.8 C) (Tympanic)   Wt 168 lb 12.8 oz (76.6 kg)   BMI 30.87 kg/m    ECOG Performance Status: 1 - Symptomatic but completely ambulatory  GENERAL: Patient is a well appearing female in no acute distress HEENT:  PERRL, anicteric NODES:  No inguinal lymphadenopathy palpated.  LUNGS:  Clear to auscultation bilaterally.  No wheezes or rhonchi. HEART:  Regular rate and rhythm.  ABDOMEN:  Soft, nontender.  Positive, normoactive bowel sounds. No ascites or hepatomegaly EXTREMITIES:  No peripheral edema.   NEURO:  Nonfocal. Well oriented.  Appropriate affect.  Chaperoned by NP: EGBUS: diffuse erythema; not lesions; well healed 3 cm oblique incision of the left vulva; The area where she says is having irritation is right along the scar line. There are no abnormal raised lesions.  Cervix :surgically absent Vagina: no lesions, no discharge or bleeding Uterus: surgically absent;  Adnexa: no palpable masses Rectovaginal: not indicated  PROCEDURE: The risks and benefits of the procedure were reviewed and informed consent obtained. Time out was performed. The patient received pre-procedure teaching and expressed understanding. The post-procedure instructions were reviewed with the patient and she expressed understanding. The patient does not have any barriers to learning.  Colposcopy performed after application of acetic acid. AWE noted diffusely around the vulva and labia minora. Area treated with lidocaine jelly for topical anesthesia and we waited at least 5 minutes before proceeding. Further anesthesia was obtained with 2% lidocaine. The site was cleansed with Betadine.Biopsy of the vulva at 5  O'clock medial to the labia minora performed. Hemostasis excellent with AgNO3. Patient reassessed after procedure and in stable condition. No complications.   Post-procedure  evaluation the patient was stable without complaints.     Assessment:  Erin [name] is a 73 y.o. female diagnosed with vulvar dysplasia (VIN3) s/p excisional biopsy with close, but negative, margins. Exam negative but given complaints of persistent irritation/itching and close margins vulvar biopsy was performed. Biopsy was negative for VIN, inflammation noted and probably etiology of vulvar symptoms. Colposcopy 09/02/2018 reassuring. Vulvar irritation, colposcopy possible VIN. Biopsy obtained.   Medical co-morbidities complicating care: HTN, [name], GERD, h/o melanoma.  Plan:   Problem List Items Addressed This Visit      Other   Vulvar irritation - Primary     Biopsy obtained if negative return to clinic in  12 months based on patient preference.   The patient's diagnosis, an outline of the further diagnostic and laboratory studies which will be required, the recommendation, and alternatives were discussed.  All questions were answered to the patient's satisfaction.  Verlon Au, NP  I personally had a face to face interaction and evaluated the patient jointly with the NP, Ms. Beckey Rutter.  I have reviewed her history and available records and have performed the key portions of the physical exam including  lymph node  survey, abdominal exam, pelvic exam with my findings confirming those documented above by the APP.  I have discussed the case with the APP and the patient.  I agree with the above documentation, assessment and plan which was fully formulated by me.  Counseling was completed by me.   I personally saw the patient and performed a substantive portion of this encounter in conjunction with the listed APP as documented above.  A total of 25 minutes, outside of biopsy time, were spent with the patient/family today; >50% was spent in education, counseling and coordination of care for VIN.  Karnisha Lefebre Gaetana Michaelis, MD    CC:  Referring Providers: Barnett Applebaum, MD Hervey Ard, MD   PCP: Idelle Crouch, MD [facility] [place], [place] [address] [phone]

## 2019-08-31 NOTE — Patient Instructions (Addendum)
Vulva Biopsy, Care After This sheet gives you information about how to care for yourself after your procedure. Your doctor may also give you more specific instructions. If you have problems or questions, contact your doctor. What can I expect after the procedure? After the procedure, it is common to have:  Slight bleeding from the biopsy site.  Soreness at the biopsy site. Follow these instructions at home: Biopsy site care   Follow instructions from your doctor about how to take care of your biopsy site. Make sure you: ? Clean the area using water and mild soap two times a day or as told by your doctor. Gently pat the area dry. ? If you were prescribed an antibiotic ointment, apply it as told by your doctor. Do not stop using the antibiotic even if your condition gets better. ? Take a warm water bath (sitz bath) as needed to help with pain. A sitz bath is taken while you are sitting down. The water should only come up to your hips and cover your butt. ? Leave stitches (sutures), skin glue, or skin tape (adhesive) strips in place. They may need to stay in place for 2 weeks or longer. If tape strips get loose and curl up, you may trim the loose edges. Do not remove tape strips completely unless your doctor says it is okay. ? Check your biopsy area every day for signs of infection. Check for:  More redness, swelling, or pain.  More fluid or blood.  Warmth.  Pus or a bad smell. ? Do not rub the biopsy area after peeing (urinating).  Gently pat the area dry, or use a bottle filled with warm water (peri-bottle) to clean the area.  Gently wipe from front to back. Lifestyle  Wear loose, cotton underwear.  Do not wear tight pants.  Do not use a tampon, douche, or put anything in your vagina for at least 1 week or until your doctor says it is okay.  Do not have sex for at least 1 week or until your doctor says it is okay.  Do not exercise until your doctor says it is okay.  Do not  swim or use a hot tub until your doctor says it is okay. You may shower or take a sitz bath. General instructions  Take over-the-counter and prescription medicines only as told by your doctor.  Use a sanitary pad until the bleeding stops.  Keep all follow-up visits as told by your doctor. This is important. Contact a doctor if:  You have more redness, swelling, or pain around your biopsy site.  You have more fluid or blood coming from your biopsy site.  Your biopsy site feels warm when you touch it.  Medicines do not help with your pain. Get help right away if:  You have a lot of bleeding from the vulva.  You have pus or a bad smell coming from your biopsy site.  You have a fever.  You have pain in the lower belly (abdomen). Summary  After the procedure, it is common to have slight bleeding and soreness at the biopsy site.  Follow all instructions as told by your doctor. Clean the area with water and mild soap. Do not rub. Pat the area dry.  Take sitz baths as needed. Leave any stitches in place.  Check your biopsy site for infection. Signs include more redness, swelling, pain, fluid, or blood, or feeling warm when you touch it.  Get help right away if you have a   lot of bleeding, a fever, pus or a bad smell, or pain in your lower belly. This information is not intended to replace advice given to you by your health care provider. Make sure you discuss any questions you have with your health care provider. Document Released: 02/20/2009 Document Revised: 05/27/2018 Document Reviewed: 05/27/2018 Elsevier Patient Education  2020 [employer] Inc.  

## 2019-09-02 ENCOUNTER — Telehealth: Payer: Self-pay

## 2019-09-02 LAB — SURGICAL PATHOLOGY

## 2019-09-02 NOTE — Telephone Encounter (Signed)
Pathology report for left vulvar biopsy sent to Dr. Theora Gianotti for review.

## 2019-09-02 NOTE — Telephone Encounter (Signed)
Called and discussed results with Erin [name]. All questions answered. She will return to clinic in 6 months for colposcopy and biopsies if needed. Appointment changed.  DIAGNOSIS:  A. VULVA, LEFT; BIOPSY:  - LOW GRADE SQUAMOUS INTRAEPITHELIAL LESION (LSIL / VIN1).  - NEGATIVE FOR MALIGNANCY.

## 2019-09-12 ENCOUNTER — Telehealth: Payer: Self-pay | Admitting: Internal Medicine

## 2019-09-12 ENCOUNTER — Other Ambulatory Visit: Payer: Self-pay

## 2019-09-12 NOTE — Telephone Encounter (Signed)
Pt is aware of date/time of covid test.   

## 2019-09-13 ENCOUNTER — Other Ambulatory Visit
Admission: RE | Admit: 2019-09-13 | Discharge: 2019-09-13 | Disposition: A | Discharge status: Home / Self Care | Payer: Medicare Other | Source: Ambulatory Visit | Attending: Pulmonary Disease | Admitting: Pulmonary Disease

## 2019-09-13 DIAGNOSIS — Z01812 Encounter for preprocedural laboratory examination: Secondary | ICD-10-CM | POA: Insufficient documentation

## 2019-09-13 DIAGNOSIS — Z20828 Contact with and (suspected) exposure to other viral communicable diseases: Secondary | ICD-10-CM | POA: Insufficient documentation

## 2019-09-13 LAB — SARS CORONAVIRUS 2 (TAT 6-24 HRS): SARS Coronavirus 2: NEGATIVE

## 2019-09-14 ENCOUNTER — Other Ambulatory Visit: Payer: Self-pay

## 2019-09-14 ENCOUNTER — Ambulatory Visit: Payer: Medicare Other | Attending: Pulmonary Disease

## 2019-09-14 DIAGNOSIS — J8410 Pulmonary fibrosis, unspecified: Secondary | ICD-10-CM | POA: Insufficient documentation

## 2019-09-14 DIAGNOSIS — M340 Progressive systemic sclerosis: Secondary | ICD-10-CM | POA: Diagnosis not present

## 2019-09-14 MED ORDER — ALBUTEROL SULFATE (2.5 MG/3ML) 0.083% IN NEBU
2.5000 mg | INHALATION_SOLUTION | Freq: Once | RESPIRATORY_TRACT | Status: AC
  Administered 2019-09-14: 2.5 mg via RESPIRATORY_TRACT
  Filled 2019-09-14: qty 3

## 2019-09-27 ENCOUNTER — Ambulatory Visit
Admission: RE | Admit: 2019-09-27 | Discharge: 2019-09-27 | Disposition: A | Discharge status: Home / Self Care | Payer: Medicare Other | Source: Ambulatory Visit | Attending: Internal Medicine | Admitting: Internal Medicine

## 2019-09-27 DIAGNOSIS — Z1231 Encounter for screening mammogram for malignant neoplasm of breast: Secondary | ICD-10-CM | POA: Diagnosis present

## 2019-10-13 ENCOUNTER — Telehealth: Payer: Self-pay | Admitting: Internal Medicine

## 2019-10-13 MED ORDER — OFEV 100 MG PO CAPS: 100.0000 mg | ORAL_CAPSULE | Freq: Two times a day (BID) | ORAL | 5 refills | Status: DC

## 2019-10-13 MED ORDER — OFEV 100 MG PO CAPS: 100.0000 mg | ORAL_CAPSULE | Freq: Every day | ORAL | 5 refills | Status: DC

## 2019-10-13 NOTE — Telephone Encounter (Signed)
RX for Ofev 100mg  has been sent to preferred pharmacy. Pt aware and voiced her understanding.  Nothing further is needed.

## 2019-10-13 NOTE — Telephone Encounter (Signed)
Called [facility]  Rx sent for ofev in error # 60 with directions 1 qd   I called [facility] and was placed on hold for over 12 min  I have sent a new rx in that is corrected

## 2019-10-17 ENCOUNTER — Ambulatory Visit (INDEPENDENT_AMBULATORY_CARE_PROVIDER_SITE_OTHER): Payer: Medicare Other | Admitting: Primary Care

## 2019-10-17 ENCOUNTER — Other Ambulatory Visit: Payer: Self-pay

## 2019-10-17 ENCOUNTER — Encounter: Payer: Self-pay | Admitting: Primary Care

## 2019-10-17 DIAGNOSIS — U071 COVID-19: Secondary | ICD-10-CM

## 2019-10-17 DIAGNOSIS — I25118 Atherosclerotic heart disease of native coronary artery with other forms of angina pectoris: Secondary | ICD-10-CM

## 2019-10-17 DIAGNOSIS — J84112 Idiopathic pulmonary fibrosis: Secondary | ICD-10-CM | POA: Diagnosis not present

## 2019-10-17 DIAGNOSIS — M349 Systemic sclerosis, unspecified: Secondary | ICD-10-CM

## 2019-10-17 DIAGNOSIS — J069 Acute upper respiratory infection, unspecified: Secondary | ICD-10-CM | POA: Diagnosis not present

## 2019-10-17 MED ORDER — OFEV 150 MG PO CAPS: 150.0000 mg | ORAL_CAPSULE | Freq: Every day | ORAL | 6 refills | Status: DC

## 2019-10-17 NOTE — Progress Notes (Signed)
_0  ID: Erin [name], female    DOB: March 13, 1946, 73 y.o.   MRN: [id number]  Chief Complaint  Patient presents with  . Follow-up    pt states breathing is baseline. c/o sob with exertion and occ wheezing.     Referring provider: Idelle Crouch, MD  HPI: 73 year old female, never smoked. PMH significant for pulmonary fibrosis, scleroderma. Previously seen by Dr. Alva Garnet on 02/07/19 for moderate exertional dyspnea, no changes during that visit. CT chest in November 2019 showed progression since 2016 compatible with UIP pattern d/t scleroderma. Maintained on OFEV 152m twice daily. Hospitalized in June for acute hypoxic respiratory failure d/t COVID-19, completed a 5-day course of Remdesivir. She stopped taking OFEV for two weeks- resumed once daily dosing.   10/17/2019 Patient presents today for 6-8 month follow-up for pulmonary fibrosis. She has recent PFTs that showed stable lung function with reduced diffusion capacity. She is unable to do 6 min walk d/t physical limitations. Her breathing is baseline, continues to have moderate dyspnea on exertion. Experiences occasional wheezing. She has not needed to use her proair rescue inhaler since dx of COVID in June. She is doing most of her ADLs, uses a scooter when grocery shopping. She has a hChartered certified accountantthat comes once a month to help out. She would like to go back to once a day OFEV dosing d/t diarrhea. She continues Plaquenil per rheumatology, she has an appointment with them next month. Denies fever, chills, sweats, chest pain or cough.    DATA: PFTs 2015: No obstruction, TLC 90%, DLCO 72% CT chest 10/01/15: Dilated esophagus consistent with the given history of scleroderma. This is also stable from the most recent prior CT. Lungs show changes of interstitial fibrosis, likely usual interstitial fibrosis, predominantly in the lower lungs, similar to the most recent prior CT CXR 11/18/16: Chronic interstitial changes are present at the  lung bases 6MWT 03/04/17: 168 m, no desaturation. Patient stated that she felt like she couldn't get any air and started wheezing. Echocardiogram 03/31/17: Mild concentric LV hypertrophy with LVEF 55-60%. No evidence of pulmonary hypertension. 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  Mild LVH.  Left atrium mildly dilated.  Right ventricle cavity size normal.  RV systolic function normal.  RVSP estimated 35-40 mmHg. PFT 09/14/19- FVC 1.71 (67%), FEV1 1.45 (76%), ratio 85, DLCOunc 59%  Allergies  Allergen Reactions  . Remicade [Infliximab] Shortness Of Breath and Itching  . Sulfa Antibiotics Other (See Comments)    Other Reaction: tounge cracked  . Actonel [Risedronate Sodium] Other (See Comments)  . Fosamax [Alendronate Sodium]     GI Bleed   . Procardia [Nifedipine] Hives  . Sulfur Swelling  . Wellbutrin [Bupropion] Anxiety    Immunization History  Administered Date(s) Administered  . Influenza Split 09/26/2015  . Influenza, High Dose Seasonal PF 09/16/2018, 08/09/2019  . Influenza,inj,Quad PF,6+ Mos 09/16/2018  . Influenza-Unspecified 09/15/2012, 09/20/2013, 08/29/2014, 09/26/2015, 09/09/2016, 09/17/2017  . Pneumococcal Polysaccharide-23 08/03/2009    Past  Medical History:  Diagnosis Date  . Anemia    h/o  . Cancer ([place])    melanoma x 3the last one was of her leg and surgeon removed it  . Colon polyp   . Complication of anesthesia    PT  STATED THAT WITH JOINT SURGERY THAT SHE WAS GIVEN SOME TYPE OF ANESTHESIA THAT MADE HER HALLUCINATE   . Coronary artery calcification   . Coronary artery disease   . DDD (degenerative disc disease), lumbar   . Diverticulosis 01/20/2017  . Dysphagia   . Dyspnea    with exertion-unable to walk a mile without getting sob- dr sparks set pt up to see Dr Rockey Situ after 11-19-16 surgery  . Environmental allergies   . Esophageal dysmotility   . Fibrocystic breast disease   . GERD (gastroesophageal reflux disease)   . Heart murmur    asymptomatic  . Hyperlipidemia    unspecified  . Hypertension   . Mitral regurgitation   . Monilial esophagitis ([place]) 01/20/2017  . Obstipation   . Osteopenia   . Peptic ulcer disease   . Pulmonary fibrosis ([place])    per Dr Raul Del  . RA (rheumatoid arthritis) ([place])   . Raynaud's disease   . Redundant colon 01/20/2017  . Scleroderma ([place])   . Telangiectasia of colon   . Tubular adenoma of colon 01/20/2017   unspecified  . Vulvar dysplasia     Tobacco History: Social History   Tobacco Use  Smoking Status Never Smoker  Smokeless Tobacco Never Used   Counseling given: Not Answered   Outpatient Medications Prior to Visit  Medication Sig Dispense Refill  . albuterol (VENTOLIN HFA) 108 (90 Base) MCG/ACT inhaler Inhale 2 puffs into the lungs every 4 (four) hours as needed for wheezing or shortness of breath.    . ALPRAZolam (XANAX) 0.5 MG tablet Take 1 tablet (0.5 mg total) by mouth 4 (four) times daily as needed for anxiety. 20 tablet 0  . amLODipine (NORVASC) 5 MG tablet Take 5 mg by mouth 2 (two) times daily.    Marland Kitchen aspirin 81 MG tablet Take 81 mg by mouth daily.     . clobetasol ointment (TEMOVATE) 0.05 % clobetasol 0.05 % topical ointment    . dexlansoprazole (DEXILANT) 60 MG capsule Take 1 capsule (60 mg total) by mouth daily. (Patient taking differently: Take 60 mg by mouth at bedtime. ) 30 capsule 0  . DULoxetine (CYMBALTA) 30 MG capsule Take 60 mg  by mouth daily. In the morning.    . ezetimibe (ZETIA) 10 MG tablet Take 1 tablet (10 mg total) by mouth daily. (Patient taking differently: Take 10 mg by mouth at bedtime. ) 90 tablet 3  . Fluocinolone Acetonide Body 0.01 % OIL Apply 1 application topically as needed.    . furosemide (LASIX) 20 MG tablet Take 20-40 mg by mouth daily.     Marland Kitchen gabapentin (NEURONTIN) 300 MG capsule Take 300 mg by mouth 3 (three) times daily.     Marland Kitchen HYDROcodone-acetaminophen (NORCO) 5-325 MG tablet Take 1 tablet by mouth every 6 (six) hours as needed for moderate pain. 30 tablet 0  . hydroxychloroquine (PLAQUENIL) 200 MG tablet Take 200 mg by mouth 2 (two) times daily.     Marland Kitchen latanoprost (XALATAN) 0.005 % ophthalmic solution Place 1 drop into both eyes at bedtime.     . Multiple Vitamin (MULTIVITAMIN WITH MINERALS) TABS tablet Take 1 tablet by mouth daily.    Marland Kitchen oxyCODONE-acetaminophen (PERCOCET) 7.5-325 MG tablet Take  1 tablet by mouth every 4 (four) hours as needed for severe pain. 20 tablet 0  . polyethylene glycol (MIRALAX / GLYCOLAX) packet Take 17 g by mouth daily as needed (constipation.).     Marland Kitchen predniSONE (DELTASONE) 5 MG tablet Take 1 tablet (5 mg total) by mouth 2 (two) times daily with a meal. RESUME AFTER COMPLETING THE PREDNISONE TAPER ORDERED SEPARATELY (Patient taking differently: Take 5 mg by mouth daily. )    . QUEtiapine (SEROQUEL) 25 MG tablet quetiapine 25 mg tablet    . simvastatin (ZOCOR) 80 MG tablet Take 80 mg by mouth every evening.    . zolpidem (AMBIEN) 10 MG tablet Take 1 tablet (10 mg total) by mouth at bedtime as needed for sleep. 10 tablet 0  . Zoster Vaccine Adjuvanted [facility]) injection Shingrix (PF) 50 mcg/0.5 mL intramuscular suspension, kit    . cephALEXin (KEFLEX) 500 MG capsule cephalexin 500 mg capsule    . cyclobenzaprine (FLEXERIL) 5 MG tablet cyclobenzaprine 5 mg tablet    . doxycycline (MONODOX) 100 MG capsule doxycycline monohydrate 100 mg capsule    . fluconazole (DIFLUCAN)  150 MG tablet Take 1 tablet (150 mg total) by mouth daily. Take 1 tablet (150 mg) by mouth. Three days later, take second tablet. 2 tablet 0  . levofloxacin (LEVAQUIN) 500 MG tablet levofloxacin 500 mg tablet    . methocarbamol (ROBAXIN) 500 MG tablet Take 500 mg by mouth 4 (four) times daily.    . methylPREDNISolone (MEDROL DOSEPAK) 4 MG TBPK tablet Take 1 Package by mouth once.    Marland Kitchen OFEV 100 MG CAPS Take 1 capsule (100 mg total) by mouth 2 (two) times daily. 60 capsule 5   No facility-administered medications prior to visit.     Review of Systems  Review of Systems  Constitutional: Negative.   Respiratory: Positive for shortness of breath and wheezing. Negative for cough.   Cardiovascular: Negative.     Physical Exam  BP 134/68 (BP Location: Left Arm, Cuff Size: Normal)   Pulse 97   Temp 97.9 F (36.6 C) (Temporal)   Ht _0  (1.575 m)   Wt 168 lb (76.2 kg)   SpO2 98%   BMI 30.73 kg/m  Physical Exam Constitutional:      Appearance: Normal appearance.  HENT:     Head: Normocephalic and atraumatic.  Cardiovascular:     Rate and Rhythm: Normal rate and regular rhythm.  Pulmonary:     Effort: Pulmonary effort is normal.     Breath sounds: No wheezing.     Comments: Fine crackles base Musculoskeletal:     Comments: In rolling chair  Skin:    Comments: Scleroderma changes to hands  Neurological:     General: No focal deficit present.     Mental Status: She is alert and oriented to person, place, and time. Mental status is at baseline.  Psychiatric:        Mood and Affect: Mood normal.        Behavior: Behavior normal.        Thought Content: Thought content normal.        Judgment: Judgment normal.      Lab Results:  CBC    Component Value Date/Time   WBC 13.2 (H) 06/02/2019 0320   RBC 3.76 (L) 06/02/2019 0320   HGB 12.9 07/26/2019 1041   HCT 38.0 07/26/2019 1041   PLT 381 06/02/2019 0320   MCV 91.8 06/02/2019 0320   MCH 28.2 06/02/2019 0320  MCHC 30.7  06/02/2019 0320   RDW 15.7 (H) 06/02/2019 0320   LYMPHSABS 2.4 06/02/2019 0320   MONOABS 1.5 (H) 06/02/2019 0320   EOSABS 0.0 06/02/2019 0320   BASOSABS 0.0 06/02/2019 0320    BMET    Component Value Date/Time   NA 136 07/26/2019 1041   K 4.1 07/26/2019 1041   CL 102 06/02/2019 0320   CO2 29 06/02/2019 0320   GLUCOSE 83 07/26/2019 1041   BUN 21 06/02/2019 0320   CREATININE 0.74 06/02/2019 0320   CREATININE 0.79 04/06/2012 1033   CALCIUM 8.0 (L) 06/02/2019 0320   GFRNONAA >60 06/02/2019 0320   GFRNONAA >60 04/06/2012 1033   GFRAA >60 06/02/2019 0320   GFRAA >60 04/06/2012 1033    BNP    Component Value Date/Time   BNP 84.0 02/26/2018 1313    ProBNP No results found for: PROBNP  Imaging: Mm 3d Screen Breast Bilateral  Result Date: 09/27/2019 CLINICAL DATA:  Screening. EXAM: DIGITAL SCREENING BILATERAL MAMMOGRAM WITH TOMO AND [name] COMPARISON:  Previous exam(s). ACR Breast Density Category a: The breast tissue is almost entirely fatty. FINDINGS: There are no findings suspicious for malignancy. Images were processed with [name]. IMPRESSION: No mammographic evidence of malignancy. A result letter of this screening mammogram will be mailed directly to the patient. RECOMMENDATION: Screening mammogram in one year. (Code:SM-B-01Y) BI-RADS CATEGORY  1: Negative. Electronically Signed   By: Curlene Dolphin M.D.   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 KaMortimer Friesr sooner if needed  [facility], next visit in December  - Continues Plaquenil (previously on methotrexate)  Acute respiratory disease due to COVID-19 virus - Resolved - O2 98% on room air    ElMartyn EhrichNP 10/17/2019

## 2019-10-17 NOTE — Assessment & Plan Note (Addendum)
-   Stable, experiences moderate dyspnea with exertion - FEV1 1.46 (76%), ratio 85, DLCO 59% - Not on oxygen - Currently on OFEV 100mg  once daily d/t diarrhea symptoms - Recommend TRIAL OFEV 150mg  daily  - FU in 6 months with Dr. Mortimer Fries or sooner if needed

## 2019-10-17 NOTE — Patient Instructions (Addendum)
Recommendations: Try OFEV 150mg  once daily (continue 100mg  daily until new prescription) Take miralax every other day Use imodium as needed for diarrhea   Follow-up: 6 months with Dr. Mortimer Fries or sooner if needed Let us know if diarrhea return on OFEV 150mg  dosing (and we can go back to 100mg  once daily)    Pulmonary Fibrosis  Pulmonary fibrosis is a type of lung disease that causes scarring. Over time, the scar tissue builds up in the air sacs of your lungs (alveoli). This makes it hard for you to breathe. Less oxygen can get into your blood. Scarring from pulmonary fibrosis gets worse over time. This damage is permanent and may lead to other serious health problems. What are the causes? There are many different causes of pulmonary fibrosis. Sometimes the cause is not known. This is called idiopathic pulmonary fibrosis. Other causes include:  Exposure to chemicals and substances found in agricultural, farm, Architect, or factory work. These include mold, asbestos, silica, metal dusts, and toxic fumes.  Sarcoidosis. In this disease, areas of inflammatory cells (granulomas) form and most often affect the lungs.  Autoimmune diseases. These include diseases such as rheumatoid arthritis, systemic sclerosis, or connective tissue disease.  Taking certain medicines. These include drugs used in radiation therapy or used to treat seizures, heart problems, and some infections. What increases the risk? You are more likely to develop this condition if:  You have a family history of the disease.  You are older. The condition is more common in older adults.  You have a history of smoking.  You have a job that exposes you to certain chemicals.  You have gastroesophageal reflux disease (GERD). What are the signs or symptoms? Symptoms of this condition include:  Difficulty breathing that gets worse with activity.  Shortness of breath (dyspnea).  Dry, hacking cough.  Rapid, shallow  breathing during exercise or while at rest.  Bluish skin and lips.  Loss of appetite.  Weakness.  Weight loss and fatigue.  Rounded and enlarged fingertips (clubbing). How is this diagnosed? This condition may be diagnosed based on:  Your symptoms and medical history.  A physical exam. You may also have tests, including:  A test that involves looking inside your lungs with an instrument (bronchoscopy).  Imaging studies of your lungs and heart.  Tests to measure how well you are breathing (pulmonary function tests).  Blood tests.  Tests to see how well your lungs work while you are walking (pulmonary stress test).  A procedure to remove a lung tissue sample to look at it under a microscope (biopsy). How is this treated? There is no cure for pulmonary fibrosis. Treatment focuses on managing symptoms and preventing scarring from getting worse. This may include:  Medicines, such as: ? Steroids to prevent permanent lung changes. ? Medicines to suppress your body's defense system (immune system). ? Medicines to help with lung function by reducing inflammation or scarring.  Ongoing monitoring with X-rays and lab work.  Oxygen therapy.  Pulmonary rehabilitation.  Surgery. In some cases, a lung transplant is possible. Follow these instructions at home:     Medicines  Take over-the-counter and prescription medicines only as told by your health care provider.  Keep your vaccinations up to date as recommended by your health care provider. General instructions  Do not use any products that contain nicotine or tobacco, such as cigarettes and e-cigarettes. If you need help quitting, ask your health care provider.  Get regular exercise, but do not overexert yourself. Ask  your health care provider to suggest some activities that are safe for you to do. ? If you have physical limitations, you may get exercise by walking, using a stationary bike, or doing chair  exercises. ? Ask your health care provider about using oxygen while exercising.  If you are exposed to chemicals and substances at work, make sure that you wear a mask or respirator at all times.  Join a pulmonary rehabilitation program or a support group for people with pulmonary fibrosis.  Eat small meals often so you do not get too full. Overeating can make breathing trouble worse.  Maintain a healthy weight. Lose weight if you need to.  Do breathing exercises as directed by your health care provider.  Keep all follow-up visits as told by your health care provider. This is important. Contact a health care provider if you:  Have symptoms that do not get better with medicines.  Are not able to be as active as usual.  Have trouble taking a deep breath.  Have a fever or chills.  Have blue lips or skin.  Have clubbing of your fingers. Get help right away if you:  Have a sudden worsening of your symptoms.  Have chest pain.  Cough up mucus that is dark in color.  Have a lot of headaches.  Get very confused or sleepy. Summary  Pulmonary fibrosis is a type of lung disease that causes scar tissue to build up in the air sacs of your lungs (alveoli) over time. Less oxygen can get into your blood. This makes it hard for you to breathe.  Scarring from pulmonary fibrosis gets worse over time. This damage is permanent and may lead to other serious health problems.  You are more likely to develop this condition if you have a family history of the condition or a job that exposes you to certain chemicals.  There is no cure for pulmonary fibrosis. Treatment focuses on managing symptoms and preventing scarring from getting worse. This information is not intended to replace advice given to you by your health care provider. Make sure you discuss any questions you have with your health care provider. Document Released: 02/14/2004 Document Revised: 12/30/2017 Document Reviewed:  12/30/2017 Elsevier Patient Education  2020 [employer].

## 2019-10-17 NOTE — Assessment & Plan Note (Signed)
-   Following with Rheumatology, next visit in December  - Continues Plaquenil (previously on methotrexate)

## 2019-10-17 NOTE — Assessment & Plan Note (Signed)
-   Resolved - O2 98% on room air

## 2019-10-18 ENCOUNTER — Telehealth: Payer: Self-pay | Admitting: Primary Care

## 2019-10-18 NOTE — Telephone Encounter (Signed)
Spoke to [place] with [facility] and verified dosage and directions for Ofev as prescribed by Derl Barrow, NP. Nothing further is needed.

## 2019-11-10 ENCOUNTER — Telehealth: Payer: Self-pay | Admitting: Primary Care

## 2019-11-10 NOTE — Telephone Encounter (Addendum)
Left message for carmen with  [facility].

## 2019-11-11 NOTE — Telephone Encounter (Signed)
PA for ofev 150 has been started via CMM.  [id number]  Will await determination.

## 2019-11-11 NOTE — Telephone Encounter (Signed)
Left message for [facility] with [facility].

## 2019-11-11 NOTE — Telephone Encounter (Signed)
Carmen w/ [facility] was calling back in regards to PA for medication Ofev. Cb # is [phone] ext. [phone]

## 2019-11-14 NOTE — Telephone Encounter (Signed)
Per CMM- PA for ofev 150 has been approved until 11/10/2020. Left detailed message to make [facility] with [facility] aware.

## 2020-01-06 ENCOUNTER — Telehealth: Payer: Self-pay | Admitting: Primary Care

## 2020-01-06 NOTE — Telephone Encounter (Signed)
PA for ofev has been started via CMM.  PA was approved per CMM.  [name] with [facility] has been made aware of approval. Nothing further is needed.

## 2020-01-06 NOTE — Telephone Encounter (Signed)
Spoke to Adin with [facility] who is requesting PA for ofev 150mg .  Per 11/10/2019 phone note, PA was started for ofev 150mg  and PA was approved until 11/10/2020. Fransisco Beau stated that in [facility] system it shows rejection for this medication.  Fransisco Beau will start PA via CMM and fax it to [place] for our office to complete PA.  Will await PA request.

## 2020-02-22 ENCOUNTER — Ambulatory Visit: Payer: Medicare Other

## 2020-03-14 ENCOUNTER — Other Ambulatory Visit: Payer: Self-pay

## 2020-03-14 ENCOUNTER — Inpatient Hospital Stay: Payer: Medicare Other | Attending: Obstetrics and Gynecology | Admitting: Obstetrics and Gynecology

## 2020-03-14 VITALS — BP 134/78 | HR 90 | Temp 98.4°F | Resp 18 | Ht 60.0 in | Wt 157.8 lb

## 2020-03-14 DIAGNOSIS — I5032 Chronic diastolic (congestive) heart failure: Secondary | ICD-10-CM | POA: Insufficient documentation

## 2020-03-14 DIAGNOSIS — E785 Hyperlipidemia, unspecified: Secondary | ICD-10-CM | POA: Diagnosis not present

## 2020-03-14 DIAGNOSIS — Z7952 Long term (current) use of systemic steroids: Secondary | ICD-10-CM | POA: Insufficient documentation

## 2020-03-14 DIAGNOSIS — Z7982 Long term (current) use of aspirin: Secondary | ICD-10-CM | POA: Insufficient documentation

## 2020-03-14 DIAGNOSIS — Z90722 Acquired absence of ovaries, bilateral: Secondary | ICD-10-CM | POA: Insufficient documentation

## 2020-03-14 DIAGNOSIS — Z79899 Other long term (current) drug therapy: Secondary | ICD-10-CM | POA: Insufficient documentation

## 2020-03-14 DIAGNOSIS — Z8049 Family history of malignant neoplasm of other genital organs: Secondary | ICD-10-CM | POA: Insufficient documentation

## 2020-03-14 DIAGNOSIS — Z90710 Acquired absence of both cervix and uterus: Secondary | ICD-10-CM | POA: Insufficient documentation

## 2020-03-14 DIAGNOSIS — Z8262 Family history of osteoporosis: Secondary | ICD-10-CM | POA: Diagnosis not present

## 2020-03-14 DIAGNOSIS — M069 Rheumatoid arthritis, unspecified: Secondary | ICD-10-CM | POA: Diagnosis not present

## 2020-03-14 DIAGNOSIS — Z8052 Family history of malignant neoplasm of bladder: Secondary | ICD-10-CM | POA: Insufficient documentation

## 2020-03-14 DIAGNOSIS — Z85820 Personal history of malignant melanoma of skin: Secondary | ICD-10-CM | POA: Diagnosis not present

## 2020-03-14 DIAGNOSIS — N9089 Other specified noninflammatory disorders of vulva and perineum: Secondary | ICD-10-CM

## 2020-03-14 DIAGNOSIS — I110 Hypertensive heart disease with heart failure: Secondary | ICD-10-CM | POA: Insufficient documentation

## 2020-03-14 DIAGNOSIS — Z8616 Personal history of COVID-19: Secondary | ICD-10-CM | POA: Diagnosis not present

## 2020-03-14 DIAGNOSIS — Z8249 Family history of ischemic heart disease and other diseases of the circulatory system: Secondary | ICD-10-CM | POA: Diagnosis not present

## 2020-03-14 DIAGNOSIS — Z8544 Personal history of malignant neoplasm of other female genital organs: Secondary | ICD-10-CM | POA: Diagnosis present

## 2020-03-14 DIAGNOSIS — Z9079 Acquired absence of other genital organ(s): Secondary | ICD-10-CM | POA: Diagnosis not present

## 2020-03-14 DIAGNOSIS — R87623 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): Secondary | ICD-10-CM

## 2020-03-14 NOTE — Progress Notes (Signed)
Patient is here for follow up she is doing well no major changes

## 2020-03-14 NOTE — Progress Notes (Signed)
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 female s/p TAH, seen in consultation from Dr. Kenton Kingfisher for [place]. Dr. Bary Castilla initially referred Ms. Ram to Dr. Kenton Kingfisher. She returns to clinic today for continued surveillance.   She had a very challenging 2020 and was diagnosed with COVID. Seen in 9/20 and had complaints of vulvar irritation, especially along the left vulvar scar. 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   No complaints of vulvar itching or irritation presently. Uses a walker for ambulation.        Gynecologic Oncology History Erin [name] is a pleasant female s/p TAH who is seen in consultation from Dr. Kenton Kingfisher for [place]. Dr. Bary Castilla initially referred Ms. Schnackenberg to Dr. Kenton Kingfisher.   The patient underwent left leg wide excision with sentinel node biopsy for melanoma and excision left labial nodule on 11/19/2016. Based on pathology report the vulvar nodule was 1 x 0.8 cm and the entire specimen removed was 1.5 x 0.9 x 0.3 cm. She complains of persistent irritation/itching and has been using clobesetrol ointment for years.   DIAGNOSIS:  A. SKIN AND SOFT TISSUE, LEFT THIGH; EXCISION:  - NEGATIVE FOR RESIDUAL MELANOMA.  - BIOPSY SITE CHANGES.  - ALL MARGINS APPEAR CLEAR.   B. SENTINEL LYMPH NODE #1, LEFT INGUINAL; EXCISION:  - NEGATIVE FOR MALIGNANCY BY IMMUNOHISTOCHEMISTRY FOR SOX10.   C. LEFT LABIAL NODULE; EXCISION:  - HIGH-GRADE SQUAMOUS INTRAEPITHELIAL LESION (HSIL, VIN 3).   Comment:  In part C, there is HSIL 0.5 mm from a lateral margin. HSIL is present in one tip section, so this margin is very close also. Complete gynecologic exam for other HPV-associated lesions and close follow-up are advised.  She recently had colonoscopy and EGD on 01/20/2017; pathology colonic polyps and GE junction lesion inflamed  granulation tissue Barrett's esophagus can not be excluded. Noted to have esophageal lesion and endoscopic ultrasound ordered on 01/29/2017.  01/28/2017 she had vulvar colposcopy and biopsy that demonstrated  DIAGNOSIS:  A. VULVA, LEFT, 5:00:  - VULVAR TISSUE WITH SCANT INFLAMMATION.  - NEGATIVE FOR DYSPLASIA AND MALIGNANCY.  - MULTIPLE DEEPER LEVELS WERE EXAMINED.  Of note she does not have a history of abnormal Pap, genital warts, or other gynecologic related issues.   She had a h/o chronic irritation but otherwise NED.   Problem List: Patient Active Problem List   Diagnosis Date Noted  . Colon polyp 08/31/2019  . Coronary artery calcification 08/31/2019  . Degenerative disc disease, lumbar 08/31/2019  . Dysphagia 08/31/2019  . Environmental allergies 08/31/2019  . Esophageal dysmotility 08/31/2019  . Fibrocystic breast disease 08/31/2019  . Mitral regurgitation 08/31/2019  . Acute respiratory disease due to COVID-19 virus 05/28/2019  . Food in esophagus causing other injury, initial encounter 04/21/2019  . Gastro-esophageal reflux disease with esophagitis 04/21/2019  . Gastric nodule 04/21/2019  . Varicose veins of leg with swelling, bilateral 06/25/2018  . Acute diastolic CHF (congestive heart failure) ([place]) 03/18/2018  . Lower extremity edema 03/18/2018  . Age-related osteoporosis with current pathological fracture 03/04/2018  . Symptomatic anemia 02/28/2018  . S/P hip hemiarthroplasty 02/26/2018  . Hip fracture ([place]) 02/22/2018  . Lumbar radiculopathy 01/20/2018  . Polyneuropathy 01/20/2018  . Chronic insomnia 01/15/2018  . Hyperlipidemia 10/26/2017  . Coronary artery disease of native artery of native heart with stable angina pectoris ([place]) 10/24/2017  .  Vulvar irritation 09/02/2017  . 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 SOB (shortness of breath) 12/15/2016  . Idiopathic  interstitial fibrosis of lung syndrome ([place]) 12/15/2016  . Raynaud's disease 12/15/2016  . Melanoma of skin ([place]) 11/11/2016  . Lesion of labia 11/11/2016  . HTN, goal below 140/90 08/29/2014  . Encounter for long-term current use of medication 03/30/2014  . Scleroderma ([place]) 03/15/2014  . Rheumatoid arthritis ([place]) 03/15/2014  . Osteopenia 03/15/2014    Past Medical History: Past Medical History:  Diagnosis Date  . Anemia    h/o  . Cancer ([place])    melanoma x 3the last one was of her leg and surgeon removed it  . Colon polyp   . Complication of anesthesia    PT STATED THAT WITH JOINT SURGERY THAT SHE WAS GIVEN SOME TYPE OF ANESTHESIA THAT MADE HER HALLUCINATE   . Coronary artery calcification   . Coronary artery disease   . DDD (degenerative disc disease), lumbar   . Diverticulosis 01/20/2017  . Dysphagia   . Dyspnea    with exertion-unable to walk a mile without getting sob- dr sparks set pt up to see Dr Rockey Situ after 11-19-16 surgery  . Environmental allergies   . Esophageal dysmotility   . Fibrocystic breast disease   . GERD (gastroesophageal reflux disease)   . Heart murmur    asymptomatic  . Hyperlipidemia    unspecified  . Hypertension   . Mitral regurgitation   . Monilial esophagitis ([place]) 01/20/2017  . Obstipation   . Osteopenia   . Peptic ulcer disease   . Pulmonary fibrosis ([place])    per Dr Raul Del  . RA (rheumatoid arthritis) ([place])   . Raynaud's disease   . Redundant colon 01/20/2017  . Scleroderma ([place])   . Telangiectasia of colon   . Tubular adenoma of colon 01/20/2017   unspecified  . Vulvar dysplasia     Past Surgical History: Past Surgical History:  Procedure Laterality Date  . ABDOMINAL HYSTERECTOMY    . BREAST CYST EXCISION Left 20+ years ago   No scar visible  . BREAST LUMPECTOMY Right   . CARPAL TUNNEL RELEASE Bilateral   . COLON SURGERY     colon polyp   . COLONOSCOPY  09/30/2011   tubular adenoma rtm  . COLONOSCOPY     05/02/2003,  04/10/2000  . COLONOSCOPY WITH PROPOFOL N/A 01/20/2017   Procedure: COLONOSCOPY WITH PROPOFOL;  Surgeon: Lollie Sails, MD;  Location[facility] ENDOSCOPY;  Service: Endoscopy;  Laterality: N/A;  . ESOPHAGOGASTRODUODENOSCOPY    . ESOPHAGOGASTRODUODENOSCOPY     09/30/2011, 04/10/2000 , no repeat rtm  . ESOPHAGOGASTRODUODENOSCOPY N/A 03/02/2018   Procedure: ESOPHAGOGASTRODUODENOSCOPY (EGD);  Surgeon: Virgel Manifold, MD;  Location: [facility] ENDOSCOPY;  Service: Endoscopy;  Laterality: N/A;  . ESOPHAGOGASTRODUODENOSCOPY (EGD) WITH PROPOFOL N/A 01/20/2017   Procedure: ESOPHAGOGASTRODUODENOSCOPY (EGD) WITH PROPOFOL;  Surgeon: Lollie Sails, MD;  Location: [facility] ENDOSCOPY;  Service: Endoscopy;  Laterality: N/A;  . EXCISION HYDRADENITIS LABIA Left 11/19/2016   Procedure: EXCISION LABIAL MASS;  Surgeon: Robert Bellow, MD;  Location: [facility] ORS;  Service: General;  Laterality: Left;  . EYE SURGERY Bilateral    cataracts  . HEEL SPUR EXCISION    . HIP ARTHROPLASTY Left 02/23/2018   Procedure: ARTHROPLASTY BIPOLAR HIP (HEMIARTHROPLASTY);  Surgeon: Thornton Park, MD;  Location: [facility] ORS;  Service: Orthopedics;  Laterality: Left;  . KYPHOPLASTY N/A 04/26/2019   Procedure: KYPHOPLASTY  T7;  Surgeon: Hessie Knows, MD;  Location: [facility] ORS;  Service: Orthopedics;  Laterality: N/A;  . KYPHOPLASTY N/A 05/17/2019   Procedure: KYPHOPLASTY T8;  Surgeon: Hessie Knows, MD;  Location: [facility] ORS;  Service: Orthopedics;  Laterality: N/A;  . KYPHOPLASTY N/A 07/26/2019   Procedure: T-9 KYPHOPLASTY;  Surgeon: Hessie Knows, MD;  Location: [facility] ORS;  Service: Orthopedics;  Laterality: N/A;  . MASS EXCISION Left 11/19/2016   Procedure: EXCISION LEFT THIGH MELANOMA;  Surgeon: Robert Bellow, MD;  Location: [facility] ORS;  Service: General;  Laterality: Left;  . SENTINEL NODE BIOPSY Left 11/19/2016   Procedure: INGUINAL SENTINEL NODE BIOPSY;  Surgeon: Robert Bellow, MD;  Location: [facility] ORS;  Service: General;  Laterality:  Left;  . UPPER ESOPHAGEAL ENDOSCOPIC ULTRASOUND (EUS) N/A 01/29/2017   Procedure: UPPER ESOPHAGEAL ENDOSCOPIC ULTRASOUND (EUS);  Surgeon: Holly Bodily, MD;  Location[facility] ENDOSCOPY;  Service: Endoscopy;  Laterality: N/A;    Past Gynecologic History:  As per HPI  OB History:  OB History  Gravida Para Term Preterm AB Living  3 3          SAB TAB Ectopic Multiple Live Births               # Outcome Date GA Lbr Len/2nd Weight Sex Delivery Anes PTL Lv  3 Para           2 Para           1 Para             Family History: Family History  Problem Relation Age of Onset  . Endometrial cancer Mother 45  . Hypertension Mother   . Osteoporosis Mother   . Bladder Cancer Father   . COPD Father   . Cerebral palsy Sister     Social History: Social History   Socioeconomic History  . Marital status: Widowed    Spouse name: Not on file  . Number of children: Not on file  . Years of education: Not on file  . Highest education level: Not on file  Occupational History  . Occupation: retired  Tobacco Use  . Smoking status: Never Smoker  . Smokeless tobacco: Never Used  Substance and Sexual Activity  . Alcohol use: No  . Drug use: No  . Sexual activity: Not on file  Other Topics Concern  . Not on file  Social History Narrative  . Not on file   Social Determinants of Health   Financial Resource Strain:   . Difficulty of Paying Living Expenses:   Food Insecurity:   . Worried About Charity fundraiser in the Last Year:   . Arboriculturist in the Last Year:   Transportation Needs:   . Film/video editor (Medical):   Marland Kitchen Lack of Transportation (Non-Medical):   Physical Activity:   . Days of Exercise per Week:   . Minutes of Exercise per Session:   Stress:   . Feeling of Stress :   Social Connections:   . Frequency of Communication with Friends and Family:   . Frequency of Social Gatherings with Friends and Family:   . Attends Religious Services:   . Active Member  of Clubs or Organizations:   . Attends Archivist Meetings:   Marland Kitchen Marital Status:   Intimate Partner Violence:   . Fear of Current or Ex-Partner:   . Emotionally Abused:   Marland Kitchen Physically Abused:   . Sexually Abused:     Allergies:  Allergies  Allergen Reactions  . Remicade [Infliximab] Shortness Of Breath and Itching  . Sulfa Antibiotics Other (See Comments)    Other Reaction: tounge cracked  . Actonel [Risedronate Sodium] Other (See Comments)  . Fosamax [Alendronate Sodium]     GI Bleed   . Procardia [Nifedipine] Hives  . Sulfur Swelling  . Wellbutrin [Bupropion] Anxiety    Current Medications: Current Outpatient Medications  Medication Sig Dispense Refill  . albuterol (VENTOLIN HFA) 108 (90 Base) MCG/ACT inhaler Inhale 2 puffs into the lungs every 4 (four) hours as needed for wheezing or shortness of breath.    . ALPRAZolam (XANAX) 0.5 MG tablet Take 1 tablet (0.5 mg total) by mouth 4 (four) times daily as needed for anxiety. 20 tablet 0  . amLODipine (NORVASC) 5 MG tablet Take 5 mg by mouth 2 (two) times daily.    Marland Kitchen aspirin 81 MG tablet Take 81 mg by mouth daily.     . clobetasol ointment (TEMOVATE) 0.05 % clobetasol 0.05 % topical ointment    . dexlansoprazole (DEXILANT) 60 MG capsule Take 1 capsule (60 mg total) by mouth daily. (Patient taking differently: Take 60 mg by mouth at bedtime. ) 30 capsule 0  . DULoxetine (CYMBALTA) 30 MG capsule Take 60 mg by mouth daily. In the morning.    . ezetimibe (ZETIA) 10 MG tablet Take 1 tablet (10 mg total) by mouth daily. (Patient taking differently: Take 10 mg by mouth at bedtime. ) 90 tablet 3  . Fluocinolone Acetonide Body 0.01 % OIL Apply 1 application topically as needed.    . furosemide (LASIX) 20 MG tablet Take 20-40 mg by mouth daily.     Marland Kitchen gabapentin (NEURONTIN) 300 MG capsule Take 300 mg by mouth 3 (three) times daily.     Marland Kitchen HYDROcodone-acetaminophen (NORCO) 5-325 MG tablet Take 1 tablet by mouth every 6 (six) hours as  needed for moderate pain. 30 tablet 0  . hydroxychloroquine (PLAQUENIL) 200 MG tablet Take 200 mg by mouth 2 (two) times daily.     Marland Kitchen latanoprost (XALATAN) 0.005 % ophthalmic solution Place 1 drop into both eyes at bedtime.     . Multiple Vitamin (MULTIVITAMIN WITH MINERALS) TABS tablet Take 1 tablet by mouth daily.    . Nintedanib (OFEV) 150 MG CAPS Take 1 capsule (150 mg total) by mouth daily. 30 capsule 6  . oxyCODONE-acetaminophen (PERCOCET) 7.5-325 MG tablet Take 1 tablet by mouth every 4 (four) hours as needed for severe pain. 20 tablet 0  . polyethylene glycol (MIRALAX / GLYCOLAX) packet Take 17 g by mouth daily as needed (constipation.).     Marland Kitchen predniSONE (DELTASONE) 5 MG tablet Take 1 tablet (5 mg total) by mouth 2 (two) times daily with a meal. RESUME AFTER COMPLETING THE PREDNISONE TAPER ORDERED SEPARATELY (Patient taking differently: Take 5 mg by mouth daily. )    . QUEtiapine (SEROQUEL) 25 MG tablet quetiapine 25 mg tablet    . simvastatin (ZOCOR) 80 MG tablet Take 80 mg by mouth every evening.    . zolpidem (AMBIEN) 10 MG tablet Take 1 tablet (10 mg total) by mouth at bedtime as needed for sleep. 10 tablet 0  . Zoster Vaccine Adjuvanted [facility]) injection Shingrix (PF) 50 mcg/0.5 mL intramuscular suspension, kit     No current facility-administered medications for this visit.    Review of Systems General:  Fatigue, weakness Skin: no complaints Eyes: no complaints HEENT: no complaints Breasts: no complaints Pulmonary: sob; hx of covid-19 infection Cardiac: no  complaints Gastrointestinal: no complaints Genitourinary/Sexual: no complaints Ob/Gyn: no complaints Musculoskeletal: back pain Hematology: no complaints Neurologic/Psych: depression    Objective:  Physical Examination:  BP 134/78 (BP Location: Left Arm, Patient Position: Sitting, Cuff Size: Normal)   Pulse 90   Temp 98.4 F (36.9 C)   Resp 18   Ht 5' (1.524 m)   Wt 157 lb 12.8 oz (71.6 kg)   SpO2 97%    BMI 30.82 kg/m    ECOG Performance Status: 1 - Symptomatic but completely ambulatory  GENERAL: Patient is a well appearing female in no acute distress HEENT:  PERRL, anicteric NODES:  No inguinal lymphadenopathy palpated.  LUNGS:  Clear to auscultation bilaterally.  No wheezes or rhonchi. HEART:  Regular rate and rhythm.  ABDOMEN:  Soft, nontender.  Positive, normoactive bowel sounds. No ascites or hepatomegaly EXTREMITIES:  No peripheral edema.   NEURO:  Nonfocal. Well oriented.  Appropriate affect.  Chaperoned by NP: EGBUS: atrophic, 3 cm oblique incision of the left vulva; There are no lesions noted.  Cervix :surgically absent Vagina: no lesions, no discharge or bleeding Uterus: surgically absent;  Adnexa: no palpable masses Rectovaginal: normal  Assessment:  Erin [name] is a 74 y.o. female diagnosed with vulvar dysplasia (VIN3) s/p excisional biopsy 12/17 with close, but negative, margins.  Follow up biopsy 2/18 was done due to irritation and was negative. Colposcopy 09/02/2018 reassuring. Vulvar irritation 9/20 and colposcopy showed possible VIN. Biopsy obtained and showed VIN1.   No itching or irritation now and vulvar exam completely normal.    Medical co-morbidities complicating care: HTN, [name], GERD, h/o melanoma.  Plan:   Problem List Items Addressed This Visit      Other   Lesion of labia - Primary   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)   Vulvar irritation     Return to clinic in 12 months based on patient preference or sooner if any vulvar symptoms or lesions noted.   She will have colonoscopy in June as scheduled  The patient's diagnosis, an outline of the further diagnostic and laboratory studies which will be required, the recommendation, and alternatives were discussed.  All questions were answered to the patient's satisfaction.  Mellody Drown, MD   CC:  Referring Providers: Barnett Applebaum, MD Hervey Ard, MD   PCP: Idelle Crouch, MD [place] [facility] [place], [place] [address] [phone]

## 2020-05-22 ENCOUNTER — Other Ambulatory Visit: Payer: Self-pay

## 2020-05-22 MED ORDER — OFEV 150 MG PO CAPS: 150.0000 mg | ORAL_CAPSULE | Freq: Every day | ORAL | 1 refills | Status: DC

## 2020-06-26 ENCOUNTER — Other Ambulatory Visit: Payer: Self-pay | Admitting: Internal Medicine

## 2020-06-26 DIAGNOSIS — Z1231 Encounter for screening mammogram for malignant neoplasm of breast: Secondary | ICD-10-CM

## 2020-08-29 ENCOUNTER — Ambulatory Visit: Payer: Medicare Other

## 2020-09-13 ENCOUNTER — Other Ambulatory Visit: Payer: Self-pay

## 2020-09-13 ENCOUNTER — Other Ambulatory Visit
Admission: RE | Admit: 2020-09-13 | Discharge: 2020-09-13 | Disposition: A | Discharge status: Home / Self Care | Payer: Medicare Other | Source: Ambulatory Visit | Attending: Gastroenterology | Admitting: Gastroenterology

## 2020-09-13 DIAGNOSIS — Z20822 Contact with and (suspected) exposure to covid-19: Secondary | ICD-10-CM | POA: Insufficient documentation

## 2020-09-13 DIAGNOSIS — Z01818 Encounter for other preprocedural examination: Secondary | ICD-10-CM | POA: Insufficient documentation

## 2020-09-13 LAB — SARS CORONAVIRUS 2 (TAT 6-24 HRS): SARS Coronavirus 2: NEGATIVE

## 2020-09-14 ENCOUNTER — Encounter: Payer: Self-pay | Admitting: *Deleted

## 2020-09-17 ENCOUNTER — Encounter
Admission: RE | Disposition: A | Discharge status: Home / Self Care | Payer: Self-pay | Source: Home / Self Care | Attending: Gastroenterology

## 2020-09-17 ENCOUNTER — Ambulatory Visit: Payer: Medicare Other | Admitting: Anesthesiology

## 2020-09-17 ENCOUNTER — Ambulatory Visit
Admission: RE | Admit: 2020-09-17 | Discharge: 2020-09-17 | Disposition: A | Discharge status: Home / Self Care | Payer: Medicare Other | Attending: Gastroenterology | Admitting: Gastroenterology

## 2020-09-17 ENCOUNTER — Encounter: Payer: Self-pay | Admitting: Anesthesiology

## 2020-09-17 ENCOUNTER — Other Ambulatory Visit: Payer: Self-pay

## 2020-09-17 DIAGNOSIS — Z09 Encounter for follow-up examination after completed treatment for conditions other than malignant neoplasm: Secondary | ICD-10-CM | POA: Diagnosis present

## 2020-09-17 DIAGNOSIS — Z888 Allergy status to other drugs, medicaments and biological substances status: Secondary | ICD-10-CM | POA: Insufficient documentation

## 2020-09-17 DIAGNOSIS — Z7982 Long term (current) use of aspirin: Secondary | ICD-10-CM | POA: Insufficient documentation

## 2020-09-17 DIAGNOSIS — I110 Hypertensive heart disease with heart failure: Secondary | ICD-10-CM | POA: Insufficient documentation

## 2020-09-17 DIAGNOSIS — K219 Gastro-esophageal reflux disease without esophagitis: Secondary | ICD-10-CM | POA: Insufficient documentation

## 2020-09-17 DIAGNOSIS — I2510 Atherosclerotic heart disease of native coronary artery without angina pectoris: Secondary | ICD-10-CM | POA: Insufficient documentation

## 2020-09-17 DIAGNOSIS — Z8711 Personal history of peptic ulcer disease: Secondary | ICD-10-CM | POA: Diagnosis not present

## 2020-09-17 DIAGNOSIS — R1310 Dysphagia, unspecified: Secondary | ICD-10-CM | POA: Diagnosis not present

## 2020-09-17 DIAGNOSIS — Q438 Other specified congenital malformations of intestine: Secondary | ICD-10-CM | POA: Insufficient documentation

## 2020-09-17 DIAGNOSIS — Z882 Allergy status to sulfonamides status: Secondary | ICD-10-CM | POA: Insufficient documentation

## 2020-09-17 DIAGNOSIS — M069 Rheumatoid arthritis, unspecified: Secondary | ICD-10-CM | POA: Diagnosis not present

## 2020-09-17 DIAGNOSIS — E785 Hyperlipidemia, unspecified: Secondary | ICD-10-CM | POA: Diagnosis not present

## 2020-09-17 DIAGNOSIS — I509 Heart failure, unspecified: Secondary | ICD-10-CM | POA: Insufficient documentation

## 2020-09-17 DIAGNOSIS — D123 Benign neoplasm of transverse colon: Secondary | ICD-10-CM | POA: Diagnosis not present

## 2020-09-17 DIAGNOSIS — M8580 Other specified disorders of bone density and structure, unspecified site: Secondary | ICD-10-CM | POA: Diagnosis not present

## 2020-09-17 DIAGNOSIS — K5730 Diverticulosis of large intestine without perforation or abscess without bleeding: Secondary | ICD-10-CM | POA: Diagnosis not present

## 2020-09-17 DIAGNOSIS — Z79899 Other long term (current) drug therapy: Secondary | ICD-10-CM | POA: Diagnosis not present

## 2020-09-17 DIAGNOSIS — Z85820 Personal history of malignant melanoma of skin: Secondary | ICD-10-CM | POA: Insufficient documentation

## 2020-09-17 DIAGNOSIS — Z86010 Personal history of colonic polyps: Secondary | ICD-10-CM | POA: Insufficient documentation

## 2020-09-17 DIAGNOSIS — J8410 Pulmonary fibrosis, unspecified: Secondary | ICD-10-CM | POA: Insufficient documentation

## 2020-09-17 HISTORY — PX: ESOPHAGOGASTRODUODENOSCOPY (EGD) WITH PROPOFOL: SHX5813

## 2020-09-17 HISTORY — PX: COLONOSCOPY WITH PROPOFOL: SHX5780

## 2020-09-17 LAB — KOH PREP

## 2020-09-17 SURGERY — ESOPHAGOGASTRODUODENOSCOPY (EGD) WITH PROPOFOL
Anesthesia: General

## 2020-09-17 MED ORDER — PROPOFOL 500 MG/50ML IV EMUL
INTRAVENOUS | Status: AC
  Filled 2020-09-17: qty 50

## 2020-09-17 MED ORDER — LIDOCAINE HCL (PF) 2 % IJ SOLN
INTRAMUSCULAR | Status: AC
  Filled 2020-09-17: qty 5

## 2020-09-17 MED ORDER — PROPOFOL 500 MG/50ML IV EMUL
INTRAVENOUS | Status: DC | PRN
  Administered 2020-09-17: 100 ug/kg/min via INTRAVENOUS

## 2020-09-17 MED ORDER — SODIUM CHLORIDE 0.9 % IV SOLN
INTRAVENOUS | Status: DC
  Administered 2020-09-17: 20 mL/h via INTRAVENOUS

## 2020-09-17 MED ORDER — LIDOCAINE HCL (CARDIAC) PF 100 MG/5ML IV SOSY
PREFILLED_SYRINGE | INTRAVENOUS | Status: DC | PRN
  Administered 2020-09-17: 30 mg via INTRAVENOUS

## 2020-09-17 NOTE — Anesthesia Preprocedure Evaluation (Signed)
Anesthesia Evaluation  Patient identified by MRN, date of birth, ID band Patient awake    Reviewed: Allergy & Precautions, H&P , NPO status , Patient's Chart, lab work & pertinent test results, reviewed documented beta blocker date and time   History of Anesthesia Complications (+) history of anesthetic complications  Airway Mallampati: II   Neck ROM: full    Dental  (+) Poor Dentition, Teeth Intact   Pulmonary shortness of breath and with exertion, pneumonia, resolved,    Pulmonary exam normal        Cardiovascular Exercise Tolerance: Poor hypertension, On Medications + angina with exertion + [name] and +CHF  Normal cardiovascular exam+ Valvular Problems/Murmurs  Rhythm:regular Rate:Normal     Neuro/Psych  Neuromuscular disease negative psych ROS   GI/Hepatic Neg liver ROS, PUD, GERD  Medicated,  Endo/Other  negative endocrine ROS  Renal/GU negative Renal ROS  negative genitourinary   Musculoskeletal   Abdominal   Peds  Hematology  (+) Blood dyscrasia, anemia ,   Anesthesia Other Findings Past Medical History: No date: Anemia     Comment:  h/o No date: Cancer ([place])     Comment:  melanoma x 3the last one was of her leg and surgeon               removed it No date: Colon polyp No date: Complication of anesthesia     Comment:  PT STATED THAT WITH JOINT SURGERY THAT SHE WAS GIVEN               SOME TYPE OF ANESTHESIA THAT MADE HER HALLUCINATE  No date: Coronary artery calcification No date: Coronary artery disease No date: DDD (degenerative disc disease), lumbar 01/20/2017: Diverticulosis No date: Dysphagia No date: Dyspnea     Comment:  with exertion-unable to walk a mile without getting sob-              dr sparks set pt up to see Dr Rockey Situ after 11-19-16               surgery No date: Environmental allergies No date: Esophageal dysmotility No date: Fibrocystic breast disease No date: GERD  (gastroesophageal reflux disease) No date: Heart murmur     Comment:  asymptomatic No date: Hyperlipidemia     Comment:  unspecified No date: Hypertension No date: Mitral regurgitation 01/20/2017: Monilial esophagitis ([facility]) No date: Obstipation No date: Osteopenia No date: Peptic ulcer disease No date: Pulmonary fibrosis ([facility])     Comment:  per Dr Raul Del No date: RA (rheumatoid arthritis) ([place]) No date: Raynaud's disease 01/20/2017: Redundant colon No date: Scleroderma ([place]) No date: Telangiectasia of colon 01/20/2017: Tubular adenoma of colon     Comment:  unspecified No date: Vulvar dysplasia Past Surgical History: No date: ABDOMINAL HYSTERECTOMY 20+ years ago: BREAST CYST EXCISION; Left     Comment:  No scar visible No date: BREAST LUMPECTOMY; Right No date: CARPAL TUNNEL RELEASE; Bilateral No date: COLON SURGERY     Comment:  colon polyp  09/30/2011: COLONOSCOPY     Comment:  tubular adenoma rtm No date: COLONOSCOPY     Comment:  05/02/2003, 04/10/2000 01/20/2017: COLONOSCOPY WITH PROPOFOL; N/A     Comment:  Procedure: COLONOSCOPY WITH PROPOFOL;  Surgeon: Lollie Sails, MD;  Location: [facility] ENDOSCOPY;  Service:               Endoscopy;  Laterality: N/A; No date: ESOPHAGOGASTRODUODENOSCOPY No date:  ESOPHAGOGASTRODUODENOSCOPY     Comment:  09/30/2011, 04/10/2000 , no repeat rtm 03/02/2018: ESOPHAGOGASTRODUODENOSCOPY; N/A     Comment:  Procedure: ESOPHAGOGASTRODUODENOSCOPY (EGD);  Surgeon:               Virgel Manifold, MD;  Location: [facility] ENDOSCOPY;                Service: Endoscopy;  Laterality: N/A; 01/20/2017: ESOPHAGOGASTRODUODENOSCOPY (EGD) WITH PROPOFOL; N/A     Comment:  Procedure: ESOPHAGOGASTRODUODENOSCOPY (EGD) WITH               PROPOFOL;  Surgeon: Lollie Sails, MD;  Location:               [facility] ENDOSCOPY;  Service: Endoscopy;  Laterality: N/A; 11/19/2016: EXCISION HYDRADENITIS LABIA; Left     Comment:  Procedure: EXCISION LABIAL MASS;   Surgeon: Robert Bellow, MD;  Location: [facility] ORS;  Service: General;                Laterality: Left; No date: EYE SURGERY; Bilateral     Comment:  cataracts No date: HEEL SPUR EXCISION 02/23/2018: HIP ARTHROPLASTY; Left     Comment:  Procedure: ARTHROPLASTY BIPOLAR HIP (HEMIARTHROPLASTY);               Surgeon: Thornton Park, MD;  Location: [facility] ORS;                Service: Orthopedics;  Laterality: Left; 04/26/2019: KYPHOPLASTY; N/A     Comment:  Procedure: KYPHOPLASTY T7;  Surgeon: Hessie Knows, MD;               Location: [facility] ORS;  Service: Orthopedics;  Laterality:               N/A; 05/17/2019: KYPHOPLASTY; N/A     Comment:  Procedure: KYPHOPLASTY T8;  Surgeon: Hessie Knows, MD;               Location: [facility] ORS;  Service: Orthopedics;  Laterality:               N/A; 07/26/2019: KYPHOPLASTY; N/A     Comment:  Procedure: T-9 KYPHOPLASTY;  Surgeon: Hessie Knows, MD;              Location: [facility] ORS;  Service: Orthopedics;  Laterality:               N/A; 11/19/2016: MASS EXCISION; Left     Comment:  Procedure: EXCISION LEFT THIGH MELANOMA;  Surgeon:               Robert Bellow, MD;  Location: [facility] ORS;  Service:               General;  Laterality: Left; 11/19/2016: SENTINEL NODE BIOPSY; Left     Comment:  Procedure: INGUINAL SENTINEL NODE BIOPSY;  Surgeon:               Robert Bellow, MD;  Location: [facility] ORS;  Service:               General;  Laterality: Left; 01/29/2017: UPPER ESOPHAGEAL ENDOSCOPIC ULTRASOUND (EUS); N/A     Comment:  Procedure: UPPER ESOPHAGEAL ENDOSCOPIC ULTRASOUND (EUS);              Surgeon: Holly Bodily, MD;  Location: [facility]               ENDOSCOPY;  Service: Endoscopy;  Laterality: N/A; BMI    Body Mass Index: 30.47 kg/m     Reproductive/Obstetrics negative OB ROS                             Anesthesia Physical Anesthesia Plan  ASA: IV  Anesthesia Plan: General   Post-op Pain Management:     Induction:   PONV Risk Score and Plan:   Airway Management Planned:   Additional Equipment:   Intra-op Plan:   Post-operative Plan:   Informed Consent: I have reviewed the patients History and Physical, chart, labs and discussed the procedure including the risks, benefits and alternatives for the proposed anesthesia with the patient or authorized representative who has indicated his/her understanding and acceptance.     Dental Advisory Given  Plan Discussed with: CRNA  Anesthesia Plan Comments:         Anesthesia Quick Evaluation

## 2020-09-17 NOTE — Interval H&P Note (Signed)
History and Physical Interval Note:  09/17/2020 10:25 AM  Erin [name]  has presented today for surgery, with the diagnosis of Hx of adentamous polyps of the colon.  The various methods of treatment have been discussed with the patient and family. After consideration of risks, benefits and other options for treatment, the patient has consented to  Procedure(s): ESOPHAGOGASTRODUODENOSCOPY (EGD) WITH PROPOFOL (N/A) COLONOSCOPY WITH PROPOFOL (N/A) as a surgical intervention.  The patient's history has been reviewed, patient examined, no change in status, stable for surgery.  I have reviewed the patient's chart and labs.  Questions were answered to the patient's satisfaction.     Lesly Rubenstein  Ok to proceed with EGD/Colonoscopy

## 2020-09-17 NOTE — Op Note (Signed)
[facility] Gastroenterology Patient Name: Erin [name] Procedure Date: 09/17/2020 10:16 AM MRN: [id number] Account #: [id number] Date of Birth: 1946-10-25 Admit Type: Inpatient Age: 73 Room: [facility] ENDO ROOM 1 Gender: Female Note Status: Finalized Procedure:             Upper GI endoscopy Indications:           Dysphagia Providers:             Andrey Farmer MD, MD Medicines:             Monitored Anesthesia Care Complications:         No immediate complications. Procedure:             Pre-Anesthesia Assessment:                        - Prior to the procedure, a History and Physical was                         performed, and patient medications and allergies were                         reviewed. The patient is competent. The risks and                         benefits of the procedure and the sedation options and                         risks were discussed with the patient. All questions                         were answered and informed consent was obtained.                         Patient identification and proposed procedure were                         verified by the physician, the nurse, the anesthetist                         and the technician in the endoscopy suite. Mental                         Status Examination: alert and oriented. Airway                         Examination: normal oropharyngeal airway and neck                         mobility. Respiratory Examination: clear to                         auscultation. CV Examination: normal. Prophylactic                         Antibiotics: The patient does not require prophylactic                         antibiotics. Prior Anticoagulants: The patient has  taken no previous anticoagulant or antiplatelet agents                         except for aspirin. ASA Grade Assessment: III - A                         patient with severe systemic disease. After reviewing                          the risks and benefits, the patient was deemed in                         satisfactory condition to undergo the procedure. The                         anesthesia plan was to use monitored anesthesia care                         (MAC). Immediately prior to administration of                         medications, the patient was re-assessed for adequacy                         to receive sedatives. The heart rate, respiratory                         rate, oxygen saturations, blood pressure, adequacy of                         pulmonary ventilation, and response to care were                         monitored throughout the procedure. The physical                         status of the patient was re-assessed after the                         procedure.                        After obtaining informed consent, the endoscope was                         passed under direct vision. Throughout the procedure,                         the patient's blood pressure, pulse, and oxygen                         saturations were monitored continuously. The Endoscope                         was introduced through the mouth, and advanced to the                         second part of duodenum. The upper GI endoscopy was  accomplished without difficulty. The patient tolerated                         the procedure well. Findings:      White nummular lesions were noted in the lower third of the esophagus.       Brushings for KOH prep were obtained in the lower third of the       esophagus. Estimated blood loss was minimal.      Fluid was found in the lower third of the esophagus.      The entire examined stomach was normal.      The examined duodenum was normal. Impression:            - White nummular lesions in esophageal mucosa.                         Brushings performed.                        - Fluid in the lower third of the esophagus.                        - Normal stomach.                         - Normal examined duodenum. Recommendation:        - Discharge patient to home.                        - Resume previous diet.                        - Continue present medications.                        - Await pathology results.                        - Perform ambulatory esophageal manometry to evaluate                         for dysmotility. Procedure Code(s):     --- Professional ---                        [phone], Esophagogastroduodenoscopy, flexible,                         transoral; diagnostic, including collection of                         specimen(s) by brushing or washing, when performed                         (separate procedure) Diagnosis Code(s):     --- Professional ---                        K22.8, Other specified diseases of esophagus                        R13.10, Dysphagia, unspecified CPT copyright 2019 American [employer]. All rights reserved. The codes documented in this report are preliminary  and upon coder review may  be revised to meet current compliance requirements. Andrey Farmer, MD Andrey Farmer MD, MD 09/17/2020 11:22:23 AM Number of Addenda: 0 Note Initiated On: 09/17/2020 10:16 AM Estimated Blood Loss:  Estimated blood loss: none.      [facility]

## 2020-09-17 NOTE — H&P (Signed)
Outpatient short stay form Pre-procedure 09/17/2020 10:22 AM Erin Miyamoto MD, MPH  Primary Physician: Dr. 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 No family history of GI malignancies, no blood thinners. Has a history of hysterectomy.    Current Facility-Administered Medications:  .  0.9 %  sodium chloride infusion, , Intravenous, Continuous, Curtis Cain, Hilton Cork, MD  Medications Prior to Admission  Medication Sig Dispense Refill Last Dose  . albuterol (VENTOLIN HFA) 108 (90 Base) MCG/ACT inhaler Inhale 2 puffs into the lungs every 4 (four) hours as needed for wheezing or shortness of breath.   Past Week at Unknown time  . ALPRAZolam (XANAX) 0.5 MG tablet Take 1 tablet (0.5 mg total) by mouth 4 (four) times daily as needed for anxiety. 20 tablet 0 09/16/2020 at Unknown time  . amLODipine (NORVASC) 5 MG tablet Take 5 mg by mouth daily.    09/16/2020 at Unknown time  . aspirin 81 MG tablet Take 81 mg by mouth daily.    Past Week at Unknown time  . dexlansoprazole (DEXILANT) 60 MG capsule Take 1 capsule (60 mg total) by mouth daily. (Patient taking differently: Take 60 mg by mouth at bedtime. ) 30 capsule 0 Past Week at Unknown time  . DULoxetine (CYMBALTA) 30 MG capsule Take 60 mg by mouth daily. In the morning.   09/16/2020 at Unknown time  . ezetimibe (ZETIA) 10 MG tablet Take 1 tablet (10 mg total) by mouth daily. (Patient taking differently: Take 10 mg by mouth at bedtime. ) 90 tablet 3 Past Week at Unknown time  . Fluocinolone Acetonide Body 0.01 % OIL Apply 1 application topically as needed.   Past Week at Unknown time  . furosemide (LASIX) 20 MG tablet Take 20-40 mg by mouth daily.    09/16/2020 at Unknown time  . gabapentin (NEURONTIN) 300 MG capsule Take 300 mg by mouth 3 (three) times daily.     09/16/2020 at Unknown time  . HYDROcodone-acetaminophen (NORCO) 5-325 MG tablet Take 1 tablet by mouth every 6 (six) hours as needed for moderate pain. 30 tablet 0 Past Week at Unknown time  . hydroxychloroquine (PLAQUENIL) 200 MG tablet Take 200 mg by mouth 2 (two) times daily.    09/16/2020 at Unknown time  . latanoprost (XALATAN) 0.005 % ophthalmic solution Place 1 drop into both eyes at bedtime.    09/16/2020 at Unknown time  . Multiple Vitamin (MULTIVITAMIN WITH MINERALS) TABS tablet Take 1 tablet by mouth daily.   Past Week at Unknown time  . Nintedanib (OFEV) 150 MG CAPS Take 1 capsule (150 mg total) by mouth daily. 90 capsule 1 Past Week at Unknown time  . nitroGLYCERIN (NITROGLYN) 2 % ointment Place onto the skin.   Past Week at Unknown time  . oxyCODONE-acetaminophen (PERCOCET) 7.5-325 MG tablet Take 1 tablet by mouth every 4 (four) hours as needed for severe pain. 20 tablet 0 Past Week at Unknown time  . polyethylene glycol (MIRALAX / GLYCOLAX) packet Take 17 g by mouth daily as needed (constipation.).    09/16/2020 at Unknown time  . predniSONE (DELTASONE) 5 MG tablet Take 1 tablet (5 mg total) by mouth 2 (two) times daily with a meal. RESUME AFTER COMPLETING THE PREDNISONE TAPER ORDERED SEPARATELY (Patient taking differently: Take 5 mg by mouth daily. )   09/16/2020 at Unknown  time  . QUEtiapine (SEROQUEL) 25 MG tablet quetiapine 25 mg tablet   Past Week at Unknown time  . simvastatin (ZOCOR) 80 MG tablet Take 80 mg by mouth every evening.   Past Week at Unknown time  . zolpidem (AMBIEN) 10 MG tablet Take 1 tablet (10 mg total) by mouth at bedtime as needed for sleep. 10 tablet 0 09/16/2020 at Unknown time  . Zoster Vaccine Adjuvanted [facility]) injection Shingrix (PF) 50 mcg/0.5 mL intramuscular suspension, kit   Past Week at Unknown time  . clobetasol ointment (TEMOVATE) 0.05 % clobetasol 0.05 % topical ointment        Allergies  Allergen Reactions  . Remicade [Infliximab]  Shortness Of Breath and Itching  . Sulfa Antibiotics Other (See Comments)    Other Reaction: tounge cracked  . Actonel [Risedronate Sodium] Other (See Comments)  . Fosamax [Alendronate Sodium]     GI Bleed   . Procardia [Nifedipine] Hives  . Sulfur Swelling  . Wellbutrin [Bupropion] Anxiety     Past Medical History:  Diagnosis Date  . Anemia    h/o  . Cancer ([place])    melanoma x 3the last one was of her leg and surgeon removed it  . Colon polyp   . Complication of anesthesia    PT STATED THAT WITH JOINT SURGERY THAT SHE WAS GIVEN SOME TYPE OF ANESTHESIA THAT MADE HER HALLUCINATE   . Coronary artery calcification   . Coronary artery disease   . DDD (degenerative disc disease), lumbar   . Diverticulosis 01/20/2017  . Dysphagia   . Dyspnea    with exertion-unable to walk a mile without getting sob- dr sparks set pt up to see Dr Rockey Situ after 11-19-16 surgery  . Environmental allergies   . Esophageal dysmotility   . Fibrocystic breast disease   . GERD (gastroesophageal reflux disease)   . Heart murmur    asymptomatic  . Hyperlipidemia    unspecified  . Hypertension   . Mitral regurgitation   . Monilial esophagitis ([place]) 01/20/2017  . Obstipation   . Osteopenia   . Peptic ulcer disease   . Pulmonary fibrosis ([place])    per Dr Raul Del  . RA (rheumatoid arthritis) ([place])   . Raynaud's disease   . Redundant colon 01/20/2017  . Scleroderma ([place])   . Telangiectasia of colon   . Tubular adenoma of colon 01/20/2017   unspecified  . Vulvar dysplasia     Review of systems:  Otherwise negative.    Physical Exam  Gen: Alert, oriented. Appears stated age.  HEENT: [place]/AT. PERRLA. Lungs: No respiratory distress Abd: soft, benign, no masses. BS+ Ext: No edema. Pulses 2+    Planned procedures: Proceed with EGD/colonoscopy. The patient understands the nature of the planned procedure, indications, risks, alternatives and potential complications including but not limited to  bleeding, infection, perforation, damage to internal organs and possible oversedation/side effects from anesthesia. The patient agrees and gives consent to proceed.  Please refer to procedure notes for findings, recommendations and patient disposition/instructions.     Erin Miyamoto MD, MPH Gastroenterology 09/17/2020  10:22 AM

## 2020-09-17 NOTE — Transfer of Care (Signed)
Immediate Anesthesia Transfer of Care Note  Patient: Erin [name]  Procedure(s) Performed: ESOPHAGOGASTRODUODENOSCOPY (EGD) WITH PROPOFOL (N/A ) COLONOSCOPY WITH PROPOFOL (N/A )  Patient Location: PACU  Anesthesia Type:General  Level of Consciousness: awake and sedated  Airway & Oxygen Therapy: Patient Spontanous Breathing and Patient connected to nasal cannula oxygen  Post-op Assessment: Report given to RN and Post -op Vital signs reviewed and stable  Post vital signs: Reviewed and stable  Last Vitals:  Vitals Value Taken Time  BP    Temp    Pulse    Resp    SpO2      Last Pain:  Vitals:   09/17/20 0948  TempSrc: Temporal  PainSc: 8          Complications: No complications documented.

## 2020-09-17 NOTE — Op Note (Signed)
[facility] Gastroenterology Patient Name: Erin [name] Procedure Date: 09/17/2020 10:15 AM MRN: [id number] Account #: [id number] Date of Birth: 03/20/1946 Admit Type: Outpatient Age: 74 Room: [facility] ENDO ROOM 1 Gender: Female Note Status: Finalized Procedure:             Colonoscopy Indications:           High risk colon cancer surveillance: Personal history                         of multiple (3 or more) adenomas Providers:             Andrey Farmer MD, MD Medicines:             Monitored Anesthesia Care Complications:         No immediate complications. Estimated blood loss:                         Minimal. Procedure:             Pre-Anesthesia Assessment:                        - Prior to the procedure, a History and Physical was                         performed, and patient medications and allergies were                         reviewed. The patient is competent. The risks and                         benefits of the procedure and the sedation options and                         risks were discussed with the patient. All questions                         were answered and informed consent was obtained.                         Patient identification and proposed procedure were                         verified by the physician, the nurse, the anesthetist                         and the technician in the endoscopy suite. Mental                         Status Examination: alert and oriented. Airway                         Examination: normal oropharyngeal airway and neck                         mobility. CV Examination: normal. Prophylactic                         Antibiotics: The patient does not require prophylactic  antibiotics. Prior Anticoagulants: The patient has                         taken no previous anticoagulant or antiplatelet agents                         except for aspirin. ASA Grade Assessment: III - A                          patient with severe systemic disease. After reviewing                         the risks and benefits, the patient was deemed in                         satisfactory condition to undergo the procedure. The                         anesthesia plan was to use monitored anesthesia care                         (MAC). Immediately prior to administration of                         medications, the patient was re-assessed for adequacy                         to receive sedatives. The heart rate, respiratory                         rate, oxygen saturations, blood pressure, adequacy of                         pulmonary ventilation, and response to care were                         monitored throughout the procedure. The physical                         status of the patient was re-assessed after the                         procedure.                        After obtaining informed consent, the colonoscope was                         passed under direct vision. Throughout the procedure,                         the patient's blood pressure, pulse, and oxygen                         saturations were monitored continuously. The                         Colonoscope was introduced through the anus and  advanced to the the cecum, identified by appendiceal                         orifice and ileocecal valve. The colonoscopy was                         technically difficult and complex due to a redundant                         colon. The patient tolerated the procedure well. The                         quality of the bowel preparation was good except the                         cecum was fair. Findings:      The perianal and digital rectal examinations were normal.      A moderate amount of semi-solid stool was found in the cecum, making       visualization difficult.      A 3 mm polyp was found in the hepatic flexure. The polyp was sessile.       The polyp was removed with a cold  snare. Resection and retrieval were       complete. Estimated blood loss was minimal.      Multiple small-mouthed diverticula were found in the sigmoid colon.      The exam was otherwise without abnormality on direct and retroflexion       views. Impression:            - Stool in the cecum.                        - One 3 mm polyp at the hepatic flexure, removed with                         a cold snare. Resected and retrieved.                        - Diverticulosis in the sigmoid colon.                        - The examination was otherwise normal on direct and                         retroflexion views. Recommendation:        - Discharge patient to home.                        - Resume previous diet.                        - Continue present medications.                        - Await pathology results.                        - Repeat colonoscopy in 1 year for surveillance due to  fair prep in colon but would not schedule directly                         until evaluated in office as given medical                         comorbidities, risk of further procedures likely                         outweigh any potential benefits. The cecum prep was                         fair with no large (> 21m) lesions but smaller lesions                         culd have been missed.                        - Return to referring physician as previously                         scheduled. Procedure Code(s):     --- Professional ---                        [phone] Colonoscopy, flexible; with removal of                         tumor(s), polyp(s), or other lesion(s) by snare                         technique Diagnosis Code(s):     --- Professional ---                        Z86.010, Personal history of colonic polyps                        K63.5, Polyp of colon                        K57.30, Diverticulosis of large intestine without                         perforation or abscess  without bleeding CPT copyright 2019 American Medical Association. All rights reserved. The codes documented in this report are preliminary and upon coder review may  be revised to meet current compliance requirements. CAndrey Farmer MD CAndrey FarmerMD, MD 09/17/2020 11:27:32 AM Number of Addenda: 0 Note Initiated On: 09/17/2020 10:15 AM Scope Withdrawal Time: 0 hours 10 minutes 10 seconds  Total Procedure Duration: 0 hours 29 minutes 16 seconds  Estimated Blood Loss:  Estimated blood loss was minimal.      [facility]

## 2020-09-17 NOTE — Anesthesia Procedure Notes (Signed)
Performed by: Cook-Martin, Alizae Bechtel Pre-anesthesia Checklist: Patient identified, Emergency Drugs available, Suction available, Patient being monitored and Timeout performed Patient Re-evaluated:Patient Re-evaluated prior to induction Oxygen Delivery Method: Simple face mask Preoxygenation: Pre-oxygenation with 100% oxygen Induction Type: IV induction Airway Equipment and Method: Bite block Placement Confirmation: CO2 detector and positive ETCO2       

## 2020-09-18 ENCOUNTER — Encounter: Payer: Self-pay | Admitting: Gastroenterology

## 2020-09-18 LAB — SURGICAL PATHOLOGY

## 2020-09-18 NOTE — Anesthesia Postprocedure Evaluation (Signed)
Anesthesia Post Note  Patient: KEYLI DUROSS  Procedure(s) Performed: ESOPHAGOGASTRODUODENOSCOPY (EGD) WITH PROPOFOL (N/A ) COLONOSCOPY WITH PROPOFOL (N/A )  Patient location during evaluation: PACU Anesthesia Type: General Level of consciousness: awake and alert Pain management: pain level controlled Vital Signs Assessment: post-procedure vital signs reviewed and stable Respiratory status: spontaneous breathing, nonlabored ventilation, respiratory function stable and patient connected to nasal cannula oxygen Cardiovascular status: blood pressure returned to baseline and stable Postop Assessment: no apparent nausea or vomiting Anesthetic complications: no   No complications documented.   Last Vitals:  Vitals:   09/17/20 1130 09/17/20 1140  BP: (!) 100/52 113/63  Pulse: 73 80  Resp: 19 (!) 28  Temp:    SpO2: 96% 95%    Last Pain:  Vitals:   09/18/20 0731  TempSrc:   PainSc: 0-No pain                 Molli Barrows

## 2020-10-16 ENCOUNTER — Ambulatory Visit
Admission: RE | Admit: 2020-10-16 | Discharge: 2020-10-16 | Disposition: A | Discharge status: Home / Self Care | Payer: Medicare Other | Source: Ambulatory Visit | Attending: Internal Medicine | Admitting: Internal Medicine

## 2020-10-16 ENCOUNTER — Other Ambulatory Visit: Payer: Self-pay

## 2020-10-16 DIAGNOSIS — Z1231 Encounter for screening mammogram for malignant neoplasm of breast: Secondary | ICD-10-CM

## 2020-10-28 NOTE — H&P (View-Only) (Signed)
Cardiology Office Note  Date:  10/30/2020   ID:  Erin [name], DOB 1946-03-06, MRN [id number]  PCP:  Idelle Crouch, MD   Chief Complaint  Patient presents with  . Other    Referred by Dr. Lanney Gins for interstitial pulmomnary fibrosis. patient c.o SOB and swelling. Meds reviewed verbalyl with patient.     HPI:  Erin [name] is a very pleasant 74 year old woman with history of  [name], Seen on CT scan chest October 2016 mid to distal LAD region Complicated scleroderma,  raynauds disease (toes, hands), arthritis,  chronic interstitial lung disease at the bases seen on CT scan chest x-ray,  who presents for worsening shortness of breath on exertion, [name]  Recently seen by pulmonary at [facility] Case discussed with Dr. Lanney Gins There is concern for elevated right heart pressures, pulmonary hypertension in the setting of her shortness of breath -Request has been made for right and left heart catheterization  Long history of complications from scleroderma Wounds on her hands nonhealing, often with weeping Ray nodes, difficulty swallowing, choking, Chronic shortness of breath On low-dose prednisone  Continues on Lasix daily Trace edema, sometimes leg edema worse than other days Shortness of breath, tightness, continues to be an issue  Reports gait instability, no falls  Presenting today in a wheelchair Terrible Raynaud's pain especially as winter comes on  EKG personally reviewed by myself on todays visit Shows normal sinus rhythm rate 91 bpm poor R wave progression to the anterior precordial leads, left axis deviation  Lab work reviewed with her in detail CBC normal CHem normal Total chol 185, LDL 75  Echo 10/2018  Left ventricle: mild LVH. Systolic function was normal.   The estimated ejection fraction was in the range of 60% to 65%.   Left ventricular diastolic function parameters were normal. - Ascending aorta: The ascending aorta was mildly dilated.  Systolic  pressure was mildly increased, in   the range of 35 mm Hg to 40 mm Hg.  Several recent hospitalizations March 2019 Had fall with left femoral fracture Surgical repair, subsequent anemia Fall was mechanical, down some steps  Readmitted to the hospital several days later with worsening anemia At EGD showing small region of erosion Started on iron,  Blood transfusions for hemoglobin of 6 She reports worsening lower extremity edema Albumin/protein low  Previous CXR showing chronic interstitial changes are present at the lung bases.  CT scan results and images  mid to distal LAD coronary calcifications No significant thoracic descending  or ascending aortic atherosclerosis It does show fibrosis at the bases of the lungs bilaterally perhaps worse on the left than the right   PMH:   has a past medical history of Anemia, Cancer ([place]), Colon polyp, Complication of anesthesia, Coronary artery calcification, Coronary artery disease, DDD (degenerative disc disease), lumbar, Diverticulosis (01/20/2017), Dysphagia, Dyspnea, Environmental allergies, Esophageal dysmotility, Fibrocystic breast disease, GERD (gastroesophageal reflux disease), Heart murmur, Hyperlipidemia, Hypertension, Mitral regurgitation, Monilial esophagitis ([place]) (01/20/2017), Obstipation, Osteopenia, Peptic ulcer disease, Pulmonary fibrosis ([place]), RA (rheumatoid arthritis) ([place]), Raynaud's disease, Redundant colon (01/20/2017), Scleroderma ([place]), Telangiectasia of colon, Tubular adenoma of colon (01/20/2017), and Vulvar dysplasia.  PSH:    Past Surgical History:  Procedure Laterality Date  . ABDOMINAL HYSTERECTOMY    . BREAST CYST EXCISION Left 20+ years ago   No scar visible  . BREAST LUMPECTOMY Right   . CARPAL TUNNEL RELEASE Bilateral   . COLON SURGERY     colon polyp   . COLONOSCOPY  09/30/2011  tubular adenoma rtm  . COLONOSCOPY     05/02/2003, 04/10/2000  . COLONOSCOPY WITH PROPOFOL N/A 01/20/2017   Procedure:  COLONOSCOPY WITH PROPOFOL;  Surgeon: Lollie Sails, MD;  Location: [facility] ENDOSCOPY;  Service: Endoscopy;  Laterality: N/A;  . COLONOSCOPY WITH PROPOFOL N/A 09/17/2020   Procedure: COLONOSCOPY WITH PROPOFOL;  Surgeon: Lesly Rubenstein, MD;  Location: [facility] ENDOSCOPY;  Service: Endoscopy;  Laterality: N/A;  . ESOPHAGOGASTRODUODENOSCOPY    . ESOPHAGOGASTRODUODENOSCOPY     09/30/2011, 04/10/2000 , no repeat rtm  . ESOPHAGOGASTRODUODENOSCOPY N/A 03/02/2018   Procedure: ESOPHAGOGASTRODUODENOSCOPY (EGD);  Surgeon: Virgel Manifold, MD;  Location: [facility] ENDOSCOPY;  Service: Endoscopy;  Laterality: N/A;  . ESOPHAGOGASTRODUODENOSCOPY (EGD) WITH PROPOFOL N/A 01/20/2017   Procedure: ESOPHAGOGASTRODUODENOSCOPY (EGD) WITH PROPOFOL;  Surgeon: Lollie Sails, MD;  Location: [facility] ENDOSCOPY;  Service: Endoscopy;  Laterality: N/A;  . ESOPHAGOGASTRODUODENOSCOPY (EGD) WITH PROPOFOL N/A 09/17/2020   Procedure: ESOPHAGOGASTRODUODENOSCOPY (EGD) WITH PROPOFOL;  Surgeon: Lesly Rubenstein, MD;  Location: [facility] ENDOSCOPY;  Service: Endoscopy;  Laterality: N/A;  . EXCISION HYDRADENITIS LABIA Left 11/19/2016   Procedure: EXCISION LABIAL MASS;  Surgeon: Robert Bellow, MD;  Location: [facility] ORS;  Service: General;  Laterality: Left;  . EYE SURGERY Bilateral    cataracts  . HEEL SPUR EXCISION    . HIP ARTHROPLASTY Left 02/23/2018   Procedure: ARTHROPLASTY BIPOLAR HIP (HEMIARTHROPLASTY);  Surgeon: Thornton Park, MD;  Location: [facility] ORS;  Service: Orthopedics;  Laterality: Left;  . KYPHOPLASTY N/A 04/26/2019   Procedure: KYPHOPLASTY T7;  Surgeon: Hessie Knows, MD;  Location: [facility] ORS;  Service: Orthopedics;  Laterality: N/A;  . KYPHOPLASTY N/A 05/17/2019   Procedure: KYPHOPLASTY T8;  Surgeon: Hessie Knows, MD;  Location: [facility] ORS;  Service: Orthopedics;  Laterality: N/A;  . KYPHOPLASTY N/A 07/26/2019   Procedure: T-9 KYPHOPLASTY;  Surgeon: Hessie Knows, MD;  Location: [facility] ORS;  Service: Orthopedics;  Laterality:  N/A;  . MASS EXCISION Left 11/19/2016   Procedure: EXCISION LEFT THIGH MELANOMA;  Surgeon: Robert Bellow, MD;  Location: [facility] ORS;  Service: General;  Laterality: Left;  . SENTINEL NODE BIOPSY Left 11/19/2016   Procedure: INGUINAL SENTINEL NODE BIOPSY;  Surgeon: Robert Bellow, MD;  Location: [facility] ORS;  Service: General;  Laterality: Left;  . UPPER ESOPHAGEAL ENDOSCOPIC ULTRASOUND (EUS) N/A 01/29/2017   Procedure: UPPER ESOPHAGEAL ENDOSCOPIC ULTRASOUND (EUS);  Surgeon: Holly Bodily, MD;  Location: [facility] ENDOSCOPY;  Service: Endoscopy;  Laterality: N/A;    Current Outpatient Medications  Medication Sig Dispense Refill  . albuterol (VENTOLIN HFA) 108 (90 Base) MCG/ACT inhaler Inhale 2 puffs into the lungs every 4 (four) hours as needed for wheezing or shortness of breath.    . ALPRAZolam (XANAX) 0.5 MG tablet Take 1 tablet (0.5 mg total) by mouth 4 (four) times daily as needed for anxiety. 20 tablet 0  . amLODipine (NORVASC) 5 MG tablet Take 5 mg by mouth daily.     Marland Kitchen aspirin 81 MG tablet Take 81 mg by mouth daily.     . clobetasol ointment (TEMOVATE) 0.05 % clobetasol 0.05 % topical ointment    . dexlansoprazole (DEXILANT) 60 MG capsule Take 1 capsule (60 mg total) by mouth daily. 30 capsule 0  . DULoxetine (CYMBALTA) 30 MG capsule Take 60 mg by mouth daily. In the morning.    . ezetimibe (ZETIA) 10 MG tablet Take 1 tablet (10 mg total) by mouth daily. 90 tablet 3  . Fluocinolone Acetonide Body 0.01 % OIL Apply 1 application topically as needed.    Marland Kitchen  furosemide (LASIX) 20 MG tablet Take 20-40 mg by mouth daily.     Marland Kitchen gabapentin (NEURONTIN) 300 MG capsule Take 300 mg by mouth 3 (three) times daily.     . hydroxychloroquine (PLAQUENIL) 200 MG tablet Take 200 mg by mouth 2 (two) times daily.     Marland Kitchen latanoprost (XALATAN) 0.005 % ophthalmic solution Place 1 drop into both eyes at bedtime.     . Multiple Vitamin (MULTIVITAMIN WITH MINERALS) TABS tablet Take 1 tablet by mouth daily.    .  Nintedanib (OFEV) 150 MG CAPS Take 1 capsule (150 mg total) by mouth daily. 90 capsule 1  . nitroGLYCERIN (NITROGLYN) 2 % ointment Place onto the skin.    . polyethylene glycol (MIRALAX / GLYCOLAX) packet Take 17 g by mouth daily as needed (constipation.).     Marland Kitchen predniSONE (DELTASONE) 5 MG tablet Take 1 tablet (5 mg total) by mouth 2 (two) times daily with a meal. RESUME AFTER COMPLETING THE PREDNISONE TAPER ORDERED SEPARATELY    . simvastatin (ZOCOR) 80 MG tablet Take 80 mg by mouth every evening.    . zolpidem (AMBIEN) 10 MG tablet Take 1 tablet (10 mg total) by mouth at bedtime as needed for sleep. 10 tablet 0  . Zoster Vaccine Adjuvanted [facility]) injection Shingrix (PF) 50 mcg/0.5 mL intramuscular suspension, kit     No current facility-administered medications for this visit.     Allergies:   Remicade [infliximab], Sulfa antibiotics, Actonel [risedronate sodium], Fosamax [alendronate sodium], Procardia [nifedipine], Sulfur, and Wellbutrin [bupropion]   Social History:  The patient  reports that she has never smoked. She has never used smokeless tobacco. She reports that she does not drink alcohol and does not use drugs.   Family History:   family history includes Bladder Cancer in her father; COPD in her father; Cerebral palsy in her sister; Endometrial cancer (age of onset: 66) in her mother; Hypertension in her mother; Osteoporosis in her mother.    Review of Systems: Review of Systems  Respiratory: Negative.   Cardiovascular: Positive for leg swelling.  Gastrointestinal: Negative.   Musculoskeletal: Positive for falls.       Leg weakness  Neurological: Positive for weakness.  Psychiatric/Behavioral: Negative.   All other systems reviewed and are negative.   PHYSICAL EXAM: VS:  BP 100/66 (BP Location: Right Arm, Patient Position: Sitting, Cuff Size: Normal)   Pulse 91   Ht 4' 11"  (1.499 m)   Wt 159 lb (72.1 kg)   LMP  (LMP Unknown)   SpO2 96%   BMI 32.11 kg/m  , BMI  Body mass index is 32.11 kg/m. Constitutional:  oriented to person, place, and time. No distress.  HENT:  Head: Grossly normal Eyes:  no discharge. No scleral icterus.  Neck: No JVD, no carotid bruits  Cardiovascular: Regular rate and rhythm, no murmurs appreciated Trace lower extremity edema Pulmonary/Chest: Clear to auscultation bilaterally, no wheezes or rails Abdominal: Soft.  no distension.  no tenderness.  Musculoskeletal: Normal range of motion Neurological:  normal muscle tone. Coordination normal. No atrophy Skin: Skin warm and dry Psychiatric: normal affect, pleasant   Recent Labs: No results found for requested labs within last 8760 hours.    Lipid Panel No results found for: CHOL, HDL, LDLCALC, TRIG    Wt Readings from Last 3 Encounters:  10/30/20 159 lb (72.1 kg)  09/17/20 156 lb (70.8 kg)  03/14/20 157 lb 12.8 oz (71.6 kg)     ASSESSMENT AND PLAN:  Shortness of breath  interstitial fibrosis, not on oxygen Followed by pulmonary On discussion with pulmonary, they are requesting right and left heart catheterization, concern for unstable angina, pulmonary hypertension -Discussed risk and benefits of procedure with her, she is willing to proceed I have reviewed the risks, indications, and alternatives to cardiac catheterization, possible angioplasty, and stenting with the patient. Risks include but are not limited to bleeding, infection, vascular injury, stroke, myocardial infection, arrhythmia, kidney injury, radiation-related injury in the case of prolonged fluoroscopy use, emergency cardiac surgery, and death. The patient understands the risks of serious complication is 1-2 in 4098 with diagnostic cardiac cath and 1-2% or less with angioplasty/stenting.  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) Followed by pulmonary,  Severe disease,  Procedure as above, right heart catheterization to exclude pulmonary  hypertension  Scleroderma ([place]) Followed by rheumatology On chronic prednisone  Raynaud's disease without gangrene On chronic prednisone, some wound healing issues which is chronic  [name]  Concern for unstable angina given worsening shortness of breath No coronary disease on scans Letter left heart catheterization scheduled  Leg swelling Continue Lasix daily for now   Total encounter time more than 45 minutes  Greater than 50% was spent in counseling and coordination of care with the patient     No orders of the defined types were placed in this encounter.    Signed, Esmond Plants, M.D., Ph.D. 10/30/2020  [place], [place]

## 2020-10-28 NOTE — Progress Notes (Signed)
Cardiology Office Note  Date:  10/30/2020   ID:  Erin [name], DOB [birth year], MRN [id number]  PCP:  Idelle Crouch, MD   Chief Complaint  Patient presents with  . Other    Referred by Dr. Lanney Gins for interstitial pulmomnary fibrosis. patient c.o SOB and swelling. Meds reviewed verbalyl with patient.     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 Lanney Gins There is concern for elevated right heart pressures, pulmonary hypertension in the setting of her shortness of breath -Request has been made for right and left heart catheterization  Long history of complications from scleroderma Wounds on her hands nonhealing, often with weeping Ray nodes, difficulty swallowing, choking, Chronic shortness of breath On low-dose prednisone  Continues on Lasix daily Trace edema, sometimes leg edema worse than other days Shortness of breath, tightness, continues to be an issue  Reports gait instability, no falls  Presenting today in a wheelchair Terrible Raynaud's pain especially as winter comes on  EKG personally reviewed by myself on todays visit Shows normal sinus rhythm rate 91 bpm poor R wave progression to the anterior precordial leads, left axis deviation  Lab work reviewed with her in detail CBC normal CHem normal Total chol 185, LDL 75  Echo 10/2018  Left ventricle: mild LVH. Systolic function was normal.   The estimated ejection fraction was in the range of 60% to 65%.   Left ventricular diastolic function parameters were normal. - Ascending aorta: The ascending aorta was mildly dilated.  Systolic  pressure was mildly increased, in   the range of 35 mm Hg to 40 mm Hg.  Several recent hospitalizations March 2019 Had fall with left femoral fracture Surgical repair, subsequent anemia Fall was mechanical, down some steps  Readmitted to the hospital several days later with worsening anemia At EGD showing small region of erosion Started on iron,  Blood transfusions for hemoglobin of 6 She reports worsening lower extremity edema Albumin/protein low  Previous CXR showing chronic interstitial changes are present at the lung bases.  CT scan results and images  mid to distal LAD coronary calcifications No significant thoracic descending  or ascending aortic atherosclerosis It does show fibrosis at the bases of the lungs bilaterally perhaps worse on the left than the right   PMH:   has a past medical history of Anemia, Cancer ([place]), Colon polyp, Complication of anesthesia, Coronary artery calcification, Coronary artery disease, DDD (degenerative disc disease), lumbar, Diverticulosis (01/20/2017), Dysphagia, Dyspnea, Environmental allergies, Esophageal dysmotility, Fibrocystic breast disease, GERD (gastroesophageal reflux disease), Heart murmur, Hyperlipidemia, Hypertension, Mitral regurgitation, Monilial esophagitis ([place]) (01/20/2017), Obstipation, Osteopenia, Peptic ulcer disease, Pulmonary fibrosis ([place]), RA (rheumatoid arthritis) ([place]), Raynaud's disease, Redundant colon (01/20/2017), Scleroderma ([place]), Telangiectasia of colon, Tubular adenoma of colon (01/20/2017), and Vulvar dysplasia.  PSH:    Past Surgical History:  Procedure Laterality Date  . ABDOMINAL HYSTERECTOMY    . BREAST CYST EXCISION Left 20+ years ago   No scar visible  . BREAST LUMPECTOMY Right   . CARPAL TUNNEL RELEASE Bilateral   . COLON SURGERY     colon polyp   . COLONOSCOPY  09/30/2011  tubular adenoma rtm  . COLONOSCOPY     05/02/2003, 04/10/2000  . COLONOSCOPY WITH PROPOFOL N/A 01/20/2017   Procedure:  COLONOSCOPY WITH PROPOFOL;  Surgeon: Lollie Sails, MD;  Location: [facility] ENDOSCOPY;  Service: Endoscopy;  Laterality: N/A;  . COLONOSCOPY WITH PROPOFOL N/A 09/17/2020   Procedure: COLONOSCOPY WITH PROPOFOL;  Surgeon: Lesly Rubenstein, MD;  Location: [facility] ENDOSCOPY;  Service: Endoscopy;  Laterality: N/A;  . ESOPHAGOGASTRODUODENOSCOPY    . ESOPHAGOGASTRODUODENOSCOPY     09/30/2011, 04/10/2000 , no repeat rtm  . ESOPHAGOGASTRODUODENOSCOPY N/A 03/02/2018   Procedure: ESOPHAGOGASTRODUODENOSCOPY (EGD);  Surgeon: Virgel Manifold, MD;  Location[facility] ENDOSCOPY;  Service: Endoscopy;  Laterality: N/A;  . ESOPHAGOGASTRODUODENOSCOPY (EGD) WITH PROPOFOL N/A 01/20/2017   Procedure: ESOPHAGOGASTRODUODENOSCOPY (EGD) WITH PROPOFOL;  Surgeon: Lollie Sails, MD;  Location: [facility] ENDOSCOPY;  Service: Endoscopy;  Laterality: N/A;  . ESOPHAGOGASTRODUODENOSCOPY (EGD) WITH PROPOFOL N/A 09/17/2020   Procedure: ESOPHAGOGASTRODUODENOSCOPY (EGD) WITH PROPOFOL;  Surgeon: Lesly Rubenstein, MD;  Location: [facility] ENDOSCOPY;  Service: Endoscopy;  Laterality: N/A;  . EXCISION HYDRADENITIS LABIA Left 11/19/2016   Procedure: EXCISION LABIAL MASS;  Surgeon: Robert Bellow, MD;  Location: [facility] ORS;  Service: General;  Laterality: Left;  . EYE SURGERY Bilateral    cataracts  . HEEL SPUR EXCISION    . HIP ARTHROPLASTY Left 02/23/2018   Procedure: ARTHROPLASTY BIPOLAR HIP (HEMIARTHROPLASTY);  Surgeon: Thornton Park, MD;  Location: [facility] ORS;  Service: Orthopedics;  Laterality: Left;  . KYPHOPLASTY N/A 04/26/2019   Procedure: KYPHOPLASTY T7;  Surgeon: Hessie Knows, MD;  Location: [facility] ORS;  Service: Orthopedics;  Laterality: N/A;  . KYPHOPLASTY N/A 05/17/2019   Procedure: KYPHOPLASTY T8;  Surgeon: Hessie Knows, MD;  Location: [facility] ORS;  Service: Orthopedics;  Laterality: N/A;  . KYPHOPLASTY N/A 07/26/2019   Procedure: T-9 KYPHOPLASTY;  Surgeon: Hessie Knows, MD;  Location: [facility] ORS;  Service: Orthopedics;  Laterality:  N/A;  . MASS EXCISION Left 11/19/2016   Procedure: EXCISION LEFT THIGH MELANOMA;  Surgeon: Robert Bellow, MD;  Location: [facility] ORS;  Service: General;  Laterality: Left;  . SENTINEL NODE BIOPSY Left 11/19/2016   Procedure: INGUINAL SENTINEL NODE BIOPSY;  Surgeon: Robert Bellow, MD;  Location: [facility] ORS;  Service: General;  Laterality: Left;  . UPPER ESOPHAGEAL ENDOSCOPIC ULTRASOUND (EUS) N/A 01/29/2017   Procedure: UPPER ESOPHAGEAL ENDOSCOPIC ULTRASOUND (EUS);  Surgeon: Holly Bodily, MD;  Location: [facility] ENDOSCOPY;  Service: Endoscopy;  Laterality: N/A;    Current Outpatient Medications  Medication Sig Dispense Refill  . albuterol (VENTOLIN HFA) 108 (90 Base) MCG/ACT inhaler Inhale 2 puffs into the lungs every 4 (four) hours as needed for wheezing or shortness of breath.    . ALPRAZolam (XANAX) 0.5 MG tablet Take 1 tablet (0.5 mg total) by mouth 4 (four) times daily as needed for anxiety. 20 tablet 0  . amLODipine (NORVASC) 5 MG tablet Take 5 mg by mouth daily.     Marland Kitchen aspirin 81 MG tablet Take 81 mg by mouth daily.     . clobetasol ointment (TEMOVATE) 0.05 % clobetasol 0.05 % topical ointment    . dexlansoprazole (DEXILANT) 60 MG capsule Take 1 capsule (60 mg total) by mouth daily. 30 capsule 0  . DULoxetine (CYMBALTA) 30 MG capsule Take 60 mg by mouth daily. In the morning.    . ezetimibe (ZETIA) 10 MG tablet Take 1 tablet (10 mg total) by mouth daily. 90 tablet 3  . Fluocinolone Acetonide Body 0.01 % OIL Apply 1 application topically as needed.    Marland Kitchen  furosemide (LASIX) 20 MG tablet Take 20-40 mg by mouth daily.     Marland Kitchen gabapentin (NEURONTIN) 300 MG capsule Take 300 mg by mouth 3 (three) times daily.     . hydroxychloroquine (PLAQUENIL) 200 MG tablet Take 200 mg by mouth 2 (two) times daily.     Marland Kitchen latanoprost (XALATAN) 0.005 % ophthalmic solution Place 1 drop into both eyes at bedtime.     . Multiple Vitamin (MULTIVITAMIN WITH MINERALS) TABS tablet Take 1 tablet by mouth daily.    .  Nintedanib (OFEV) 150 MG CAPS Take 1 capsule (150 mg total) by mouth daily. 90 capsule 1  . nitroGLYCERIN (NITROGLYN) 2 % ointment Place onto the skin.    . polyethylene glycol (MIRALAX / GLYCOLAX) packet Take 17 g by mouth daily as needed (constipation.).     Marland Kitchen predniSONE (DELTASONE) 5 MG tablet Take 1 tablet (5 mg total) by mouth 2 (two) times daily with a meal. RESUME AFTER COMPLETING THE PREDNISONE TAPER ORDERED SEPARATELY    . simvastatin (ZOCOR) 80 MG tablet Take 80 mg by mouth every evening.    . zolpidem (AMBIEN) 10 MG tablet Take 1 tablet (10 mg total) by mouth at bedtime as needed for sleep. 10 tablet 0  . Zoster Vaccine Adjuvanted [facility]) injection Shingrix (PF) 50 mcg/0.5 mL intramuscular suspension, kit     No current facility-administered medications for this visit.     Allergies:   Remicade [infliximab], Sulfa antibiotics, Actonel [risedronate sodium], Fosamax [alendronate sodium], Procardia [nifedipine], Sulfur, and Wellbutrin [bupropion]   Social History:  The patient  reports that she has never smoked. She has never used smokeless tobacco. She reports that she does not drink alcohol and does not use drugs.   Family History:   family history includes Bladder Cancer in her father; COPD in her father; Cerebral palsy in her sister; Endometrial cancer (age of onset: 60) in her mother; Hypertension in her mother; Osteoporosis in her mother.    Review of Systems: Review of Systems  Respiratory: Negative.   Cardiovascular: Positive for leg swelling.  Gastrointestinal: Negative.   Musculoskeletal: Positive for falls.       Leg weakness  Neurological: Positive for weakness.  Psychiatric/Behavioral: Negative.   All other systems reviewed and are negative.   PHYSICAL EXAM: VS:  BP 100/66 (BP Location: Right Arm, Patient Position: Sitting, Cuff Size: Normal)   Pulse 91   Ht 4' 11"  (1.499 m)   Wt 159 lb (72.1 kg)   LMP  (LMP Unknown)   SpO2 96%   BMI 32.11 kg/m  , BMI  Body mass index is 32.11 kg/m. Constitutional:  oriented to person, place, and time. No distress.  HENT:  Head: Grossly normal Eyes:  no discharge. No scleral icterus.  Neck: No JVD, no carotid bruits  Cardiovascular: Regular rate and rhythm, no murmurs appreciated Trace lower extremity edema Pulmonary/Chest: Clear to auscultation bilaterally, no wheezes or rails Abdominal: Soft.  no distension.  no tenderness.  Musculoskeletal: Normal range of motion Neurological:  normal muscle tone. Coordination normal. No atrophy Skin: Skin warm and dry Psychiatric: normal affect, pleasant   Recent Labs: No results found for requested labs within last 8760 hours.    Lipid Panel No results found for: CHOL, HDL, LDLCALC, TRIG    Wt Readings from Last 3 Encounters:  10/30/20 159 lb (72.1 kg)  09/17/20 156 lb (70.8 kg)  03/14/20 157 lb 12.8 oz (71.6 kg)     ASSESSMENT AND PLAN:  Shortness of breath  interstitial fibrosis, not on oxygen Followed by pulmonary On discussion with pulmonary, they are requesting right and left heart catheterization, concern for unstable angina, pulmonary hypertension -Discussed risk and benefits of procedure with her, she is willing to proceed I have reviewed the risks, indications, and alternatives to cardiac catheterization, possible angioplasty, and stenting with the patient. Risks include but are not limited to bleeding, infection, vascular injury, stroke, myocardial infection, arrhythmia, kidney injury, radiation-related injury in the case of prolonged fluoroscopy use, emergency cardiac surgery, and death. The patient understands the risks of serious complication is 1-2 in 4621 with diagnostic cardiac cath and 1-2% or less with angioplasty/stenting.  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) Followed by pulmonary,  Severe disease,  Procedure as above, right heart catheterization to exclude pulmonary  hypertension  Scleroderma ([place]) Followed by rheumatology On chronic prednisone  Raynaud's disease without gangrene On chronic prednisone, some wound healing issues which is chronic  [name]  Concern for unstable angina given worsening shortness of breath No coronary disease on scans Letter left heart catheterization scheduled  Leg swelling Continue Lasix daily for now   Total encounter time more than 45 minutes  Greater than 50% was spent in counseling and coordination of care with the patient     No orders of the defined types were placed in this encounter.    Signed, E[place] Plants, M.D., Ph.D. 10/30/2020  [place], [place]

## 2020-10-30 ENCOUNTER — Ambulatory Visit (INDEPENDENT_AMBULATORY_CARE_PROVIDER_SITE_OTHER): Payer: Medicare Other | Admitting: Cardiovascular Disease

## 2020-10-30 ENCOUNTER — Encounter: Payer: Self-pay | Admitting: Cardiovascular Disease

## 2020-10-30 ENCOUNTER — Other Ambulatory Visit: Payer: Self-pay

## 2020-10-30 VITALS — BP 100/66 | HR 91 | Ht 59.0 in | Wt 159.0 lb

## 2020-10-30 DIAGNOSIS — I6523 Occlusion and stenosis of bilateral carotid arteries: Secondary | ICD-10-CM | POA: Insufficient documentation

## 2020-10-30 DIAGNOSIS — J84112 Idiopathic pulmonary fibrosis: Secondary | ICD-10-CM

## 2020-10-30 DIAGNOSIS — R600 Localized edema: Secondary | ICD-10-CM

## 2020-10-30 DIAGNOSIS — I200 Unstable angina: Secondary | ICD-10-CM | POA: Diagnosis not present

## 2020-10-30 DIAGNOSIS — R0602 Shortness of breath: Secondary | ICD-10-CM

## 2020-10-30 DIAGNOSIS — M349 Systemic sclerosis, unspecified: Secondary | ICD-10-CM

## 2020-10-30 DIAGNOSIS — E782 Mixed hyperlipidemia: Secondary | ICD-10-CM

## 2020-10-30 DIAGNOSIS — I5032 Chronic diastolic (congestive) heart failure: Secondary | ICD-10-CM | POA: Diagnosis not present

## 2020-10-30 DIAGNOSIS — I25118 Atherosclerotic heart disease of native coronary artery with other forms of angina pectoris: Secondary | ICD-10-CM | POA: Diagnosis not present

## 2020-10-30 NOTE — Patient Instructions (Addendum)
Medication Instructions:  No changes  If you need a refill on your cardiac medications before your next appointment, please call your pharmacy.    Lab work: CBC & BMP has been order please get these labs with your pre-procedure COVID test on ____12/07/2021_______   Walk into medical mall at the check in desk, they will direct you to lab registration, hours for labs are Monday-Friday 07:00am-5:30pm (no appointment necessary)  COVID PRE- TEST: You will need a COVID TEST prior to the procedure:  LOCATION: [address] Testing site.  DATE/TIME:  11/13/2020 08:00am-1:00pm   Testing/Procedures: Right and left heart cath for shortness of breath, pulmonary HTN, [name]  [facility] Cardiac Cath Instructions   You are scheduled for a Cardiac Cath on:________12/09/2021_________________  Please arrive at _08:30AM______am on the day of your procedure  Please expect a call from our [place] to pre-register you  Do not eat/drink anything after midnight  Someone will need to drive you home  It is recommended someone be with you for the first 24 hours after your procedure  Wear clothes that are easy to get on/off and wear slip on shoes if possible  Please bring a current list of all medications with you  _X__ You may take all of your medications the morning of your procedure with enough water to swallow safely  ___ Do not take these medications before your procedure:_______________________________________________________________________________________________________________________________________________________________________________________________________________   Day of your procedure: Arrive at the [place] entrance.  Free valet service is available.  After entering the [place] please check-in at the registration desk (1st desk on your right) to receive your armband. After receiving your armband someone will escort you to the cardiac  cath/special procedures waiting area.  The usual length of stay after your procedure is about 2 to 3 hours.  This can vary.  If you have any questions, please call our office at [phone], or you may call the cardiac cath lab at [facility] directly at [phone]  Your physician has requested that you have a cardiac catheterization. Cardiac catheterization is used to diagnose and/or treat various heart conditions. Doctors may recommend this procedure for a number of different reasons. The most common reason is to evaluate chest pain. Chest pain can be a symptom of coronary artery disease ([name]), and cardiac catheterization can show whether plaque is narrowing or blocking your heart's arteries. This procedure is also used to evaluate the valves, as well as measure the blood flow and oxygen levels in different parts of your heart. For further information please visit HugeFiesta.tn. Please follow instruction sheet, as given.    Follow-Up: At [facility], you and your health needs are our priority.  As part of our continuing mission to provide you with exceptional heart care, we have created designated Provider Care Teams.  These Care Teams include your primary Cardiologist (physician) and Advanced Practice Providers (APPs -  Physician Assistants and Nurse Practitioners) who all work together to provide you with the care you need, when you need it.  . You will need a follow up appointment in 1 month  . Providers on your designated Care Team:   . Murray Hodgkins, NP . Christell Faith, PA-C . Marrianne Mood, PA-C  Any Other Special Instructions Will Be Listed Below (If Applicable).  COVID-19 Vaccine Information can be found at: ShippingScam.co.uk For questions related to vaccine distribution or appointments, please email vaccine@[place] .[email or web address] or call [phone].

## 2020-11-07 ENCOUNTER — Ambulatory Visit: Payer: Medicare Other | Admitting: Internal Medicine

## 2020-11-13 ENCOUNTER — Other Ambulatory Visit
Admission: RE | Admit: 2020-11-13 | Discharge: 2020-11-13 | Disposition: A | Discharge status: Home / Self Care | Payer: Medicare Other | Source: Ambulatory Visit | Attending: Cardiovascular Disease | Admitting: Cardiovascular Disease

## 2020-11-13 ENCOUNTER — Other Ambulatory Visit
Admission: RE | Admit: 2020-11-13 | Discharge: 2020-11-13 | Disposition: A | Discharge status: Home / Self Care | Payer: Medicare Other | Source: Home / Self Care | Attending: Cardiovascular Disease | Admitting: Cardiovascular Disease

## 2020-11-13 ENCOUNTER — Other Ambulatory Visit: Payer: Self-pay

## 2020-11-13 DIAGNOSIS — E782 Mixed hyperlipidemia: Secondary | ICD-10-CM | POA: Insufficient documentation

## 2020-11-13 DIAGNOSIS — I5032 Chronic diastolic (congestive) heart failure: Secondary | ICD-10-CM | POA: Insufficient documentation

## 2020-11-13 DIAGNOSIS — Z20822 Contact with and (suspected) exposure to covid-19: Secondary | ICD-10-CM | POA: Insufficient documentation

## 2020-11-13 DIAGNOSIS — I25118 Atherosclerotic heart disease of native coronary artery with other forms of angina pectoris: Secondary | ICD-10-CM | POA: Insufficient documentation

## 2020-11-13 LAB — CBC
HCT: 38.8 % (ref 36.0–46.0)
Hemoglobin: 12.5 g/dL (ref 12.0–15.0)
MCH: 29.2 pg (ref 26.0–34.0)
MCHC: 32.2 g/dL (ref 30.0–36.0)
MCV: 90.7 fL (ref 80.0–100.0)
Platelets: 349 10*3/uL (ref 150–400)
RBC: 4.28 MIL/uL (ref 3.87–5.11)
RDW: 15.4 % (ref 11.5–15.5)
WBC: 11.5 10*3/uL — ABNORMAL HIGH (ref 4.0–10.5)
nRBC: 0 % (ref 0.0–0.2)

## 2020-11-13 LAB — BASIC METABOLIC PANEL
Anion gap: 10 (ref 5–15)
BUN: 16 mg/dL (ref 8–23)
CO2: 26 mmol/L (ref 22–32)
Calcium: 8.6 mg/dL — ABNORMAL LOW (ref 8.9–10.3)
Chloride: 99 mmol/L (ref 98–111)
Creatinine, Ser: 0.94 mg/dL (ref 0.44–1.00)
GFR, Estimated: >60 mL/min (ref >60)
Glucose, Bld: 96 mg/dL (ref 70–99)
Potassium: 4.3 mmol/L (ref 3.5–5.1)
Sodium: 135 mmol/L (ref 135–145)

## 2020-11-14 ENCOUNTER — Other Ambulatory Visit: Payer: Self-pay | Admitting: Cardiovascular Disease

## 2020-11-14 DIAGNOSIS — I200 Unstable angina: Secondary | ICD-10-CM

## 2020-11-14 LAB — SARS CORONAVIRUS 2 (TAT 6-24 HRS): SARS Coronavirus 2: NEGATIVE

## 2020-11-15 ENCOUNTER — Ambulatory Visit
Admission: RE | Admit: 2020-11-15 | Discharge: 2020-11-15 | Disposition: A | Discharge status: Home / Self Care | Payer: Medicare Other | Attending: Cardiovascular Disease | Admitting: Cardiovascular Disease

## 2020-11-15 ENCOUNTER — Other Ambulatory Visit: Payer: Self-pay | Admitting: Cardiovascular Disease

## 2020-11-15 ENCOUNTER — Encounter
Admission: RE | Disposition: A | Discharge status: Home / Self Care | Payer: Self-pay | Source: Home / Self Care | Attending: Cardiovascular Disease

## 2020-11-15 ENCOUNTER — Other Ambulatory Visit: Payer: Self-pay

## 2020-11-15 ENCOUNTER — Telehealth: Payer: Self-pay | Admitting: *Deleted

## 2020-11-15 ENCOUNTER — Encounter: Payer: Self-pay | Admitting: Cardiovascular Disease

## 2020-11-15 DIAGNOSIS — I25110 Atherosclerotic heart disease of native coronary artery with unstable angina pectoris: Secondary | ICD-10-CM | POA: Diagnosis present

## 2020-11-15 DIAGNOSIS — Z7982 Long term (current) use of aspirin: Secondary | ICD-10-CM | POA: Diagnosis not present

## 2020-11-15 DIAGNOSIS — I25118 Atherosclerotic heart disease of native coronary artery with other forms of angina pectoris: Secondary | ICD-10-CM

## 2020-11-15 DIAGNOSIS — Z79899 Other long term (current) drug therapy: Secondary | ICD-10-CM | POA: Diagnosis not present

## 2020-11-15 DIAGNOSIS — M349 Systemic sclerosis, unspecified: Secondary | ICD-10-CM | POA: Diagnosis not present

## 2020-11-15 DIAGNOSIS — Z01812 Encounter for preprocedural laboratory examination: Secondary | ICD-10-CM

## 2020-11-15 DIAGNOSIS — M7989 Other specified soft tissue disorders: Secondary | ICD-10-CM | POA: Insufficient documentation

## 2020-11-15 DIAGNOSIS — R0602 Shortness of breath: Secondary | ICD-10-CM | POA: Insufficient documentation

## 2020-11-15 DIAGNOSIS — J8410 Pulmonary fibrosis, unspecified: Secondary | ICD-10-CM | POA: Diagnosis not present

## 2020-11-15 DIAGNOSIS — Z882 Allergy status to sulfonamides status: Secondary | ICD-10-CM | POA: Insufficient documentation

## 2020-11-15 DIAGNOSIS — I7300 Raynaud's syndrome without gangrene: Secondary | ICD-10-CM | POA: Insufficient documentation

## 2020-11-15 DIAGNOSIS — I272 Pulmonary hypertension, unspecified: Secondary | ICD-10-CM

## 2020-11-15 DIAGNOSIS — I6523 Occlusion and stenosis of bilateral carotid arteries: Secondary | ICD-10-CM

## 2020-11-15 DIAGNOSIS — Z7952 Long term (current) use of systemic steroids: Secondary | ICD-10-CM | POA: Diagnosis not present

## 2020-11-15 DIAGNOSIS — I200 Unstable angina: Secondary | ICD-10-CM | POA: Insufficient documentation

## 2020-11-15 HISTORY — PX: RIGHT/LEFT HEART CATH AND CORONARY ANGIOGRAPHY: CATH118266

## 2020-11-15 SURGERY — RIGHT/LEFT HEART CATH AND CORONARY ANGIOGRAPHY
Anesthesia: Moderate Sedation | Laterality: Bilateral

## 2020-11-15 SURGERY — RIGHT/LEFT HEART CATH AND CORONARY ANGIOGRAPHY
Anesthesia: Moderate Sedation

## 2020-11-15 MED ORDER — FENTANYL CITRATE (PF) 100 MCG/2ML IJ SOLN
INTRAMUSCULAR | Status: DC | PRN
  Administered 2020-11-15 (×2): 25 ug via INTRAVENOUS

## 2020-11-15 MED ORDER — FENTANYL CITRATE (PF) 100 MCG/2ML IJ SOLN
INTRAMUSCULAR | Status: AC
  Filled 2020-11-15: qty 2

## 2020-11-15 MED ORDER — LABETALOL HCL 5 MG/ML IV SOLN: 10.0000 mg | INTRAVENOUS | Status: DC | PRN

## 2020-11-15 MED ORDER — LIDOCAINE HCL (PF) 1 % IJ SOLN
INTRAMUSCULAR | Status: DC | PRN
  Administered 2020-11-15: 30 mL

## 2020-11-15 MED ORDER — ASPIRIN 81 MG PO CHEW
CHEWABLE_TABLET | ORAL | Status: AC
  Administered 2020-11-15: 81 mg
  Filled 2020-11-15: qty 1

## 2020-11-15 MED ORDER — ONDANSETRON HCL 4 MG/2ML IJ SOLN: 4.0000 mg | Freq: Four times a day (QID) | INTRAMUSCULAR | Status: DC | PRN

## 2020-11-15 MED ORDER — HEPARIN (PORCINE) IN NACL 1000-0.9 UT/500ML-% IV SOLN
INTRAVENOUS | Status: AC
  Filled 2020-11-15: qty 1000

## 2020-11-15 MED ORDER — CLOPIDOGREL BISULFATE 75 MG PO TABS: 75.0000 mg | ORAL_TABLET | Freq: Every day | ORAL | 3 refills | Status: DC

## 2020-11-15 MED ORDER — HYDRALAZINE HCL 20 MG/ML IJ SOLN: 10.0000 mg | INTRAMUSCULAR | Status: DC | PRN

## 2020-11-15 MED ORDER — SODIUM CHLORIDE 0.9 % IV SOLN: 250.0000 mL | INTRAVENOUS | Status: DC | PRN

## 2020-11-15 MED ORDER — SODIUM CHLORIDE 0.9 % IV SOLN
INTRAVENOUS | Status: DC
  Administered 2020-11-15: 100 mL via INTRAVENOUS

## 2020-11-15 MED ORDER — MIDAZOLAM HCL 2 MG/2ML IJ SOLN
INTRAMUSCULAR | Status: AC
  Filled 2020-11-15: qty 2

## 2020-11-15 MED ORDER — SODIUM CHLORIDE 0.9 % IV SOLN
INTRAVENOUS | Status: AC | PRN
  Administered 2020-11-15: 72.6 mL via INTRAVENOUS

## 2020-11-15 MED ORDER — MIDAZOLAM HCL 2 MG/2ML IJ SOLN
INTRAMUSCULAR | Status: DC | PRN
  Administered 2020-11-15: 1 mg via INTRAVENOUS

## 2020-11-15 MED ORDER — HEPARIN (PORCINE) IN NACL 1000-0.9 UT/500ML-% IV SOLN
INTRAVENOUS | Status: DC | PRN
  Administered 2020-11-15: 500 mL

## 2020-11-15 MED ORDER — SODIUM CHLORIDE 0.9% FLUSH: 3.0000 mL | Freq: Two times a day (BID) | INTRAVENOUS | Status: DC

## 2020-11-15 MED ORDER — ACETAMINOPHEN 325 MG PO TABS: 650.0000 mg | ORAL_TABLET | ORAL | Status: DC | PRN

## 2020-11-15 MED ORDER — SODIUM CHLORIDE 0.9% FLUSH: 3.0000 mL | INTRAVENOUS | Status: DC | PRN

## 2020-11-15 MED ORDER — SODIUM CHLORIDE 0.9 % WEIGHT BASED INFUSION: 1.0000 mL/kg/h | INTRAVENOUS | Status: DC

## 2020-11-15 MED ORDER — ASPIRIN 81 MG PO CHEW: 81.0000 mg | CHEWABLE_TABLET | ORAL | Status: DC

## 2020-11-15 MED ORDER — IOHEXOL 300 MG/ML  SOLN
INTRAMUSCULAR | Status: DC | PRN
  Administered 2020-11-15: 75 mL

## 2020-11-15 MED ORDER — LIDOCAINE HCL (PF) 1 % IJ SOLN
INTRAMUSCULAR | Status: AC
  Filled 2020-11-15: qty 30

## 2020-11-15 SURGICAL SUPPLY — 10 items
CATH INFINITI 5FR JL4 (CATHETERS) ×3 IMPLANT
CATH INFINITI JR4 5F (CATHETERS) ×3 IMPLANT
CATH SWAN GANZ 7F STRAIGHT (CATHETERS) ×3 IMPLANT
DEVICE CLOSURE MYNXGRIP 5F (Vascular Products) ×3 IMPLANT
KIT LHK - MID 3VCOMP MANIF (MISCELLANEOUS) ×3 IMPLANT
NEEDLE PERC 18GX7CM (NEEDLE) ×3 IMPLANT
PACK CARDIAC CATH (CUSTOM PROCEDURE TRAY) ×3 IMPLANT
SHEATH AVANTI 5FR X 11CM (SHEATH) ×3 IMPLANT
SHEATH AVANTI 7FRX11 (SHEATH) ×3 IMPLANT
WIRE GUIDERIGHT .035X150 (WIRE) ×3 IMPLANT

## 2020-11-15 NOTE — Discharge Instructions (Signed)
Femoral Site Care This sheet gives you information about how to care for yourself after your procedure. Your health care provider may also give you more specific instructions. If you have problems or questions, contact your health care provider. What can I expect after the procedure? After the procedure, it is common to have:  Bruising that usually fades within 1-2 weeks.  Tenderness at the site. Follow these instructions at home: Wound care  Follow instructions from your health care provider about how to take care of your insertion site. Make sure you: ? Wash your hands with soap and water before you change your bandage (dressing). If soap and water are not available, use hand sanitizer. ? Change your dressing as told by your health care provider. ? Leave stitches (sutures), skin glue, or adhesive strips in place. These skin closures may need to stay in place for 2 weeks or longer. If adhesive strip edges start to loosen and curl up, you may trim the loose edges. Do not remove adhesive strips completely unless your health care provider tells you to do that.  Do not take baths, swim, or use a hot tub until your health care provider approves.  You may shower 24-48 hours after the procedure or as told by your health care provider. ? Gently wash the site with plain soap and water. ? Pat the area dry with a clean towel. ? Do not rub the site. This may cause bleeding.  Do not apply powder or lotion to the site. Keep the site clean and dry.  Check your femoral site every day for signs of infection. Check for: ? Redness, swelling, or pain. ? Fluid or blood. ? Warmth. ? Pus or a bad smell. Activity  For the first 2-3 days after your procedure, or as long as directed: ? Avoid climbing stairs as much as possible. ? Do not squat.  Do not lift anything that is heavier than 10 lb (4.5 kg), or the limit that you are told, until your health care provider says that it is safe.  Rest as  directed. ? Avoid sitting for a long time without moving. Get up to take short walks every 1-2 hours.  Do not drive for 24 hours if you were given a medicine to help you relax (sedative). General instructions  Take over-the-counter and prescription medicines only as told by your health care provider.  Keep all follow-up visits as told by your health care provider. This is important. Contact a health care provider if you have:  A fever or chills.  You have redness, swelling, or pain around your insertion site. Get help right away if:  The catheter insertion area swells very fast.  You pass out.  You suddenly start to sweat or your skin gets clammy.  The catheter insertion area is bleeding, and the bleeding does not stop when you hold steady pressure on the area.  The area near or just beyond the catheter insertion site becomes pale, cool, tingly, or numb. These symptoms may represent a serious problem that is an emergency. Do not wait to see if the symptoms will go away. Get medical help right away. Call your local emergency services (911 in the [place] Do not drive yourself to the hospital. Summary  After the procedure, it is common to have bruising that usually fades within 1-2 weeks.  Check your femoral site every day for signs of infection.  Do not lift anything that is heavier than 10 lb (4.5 kg), or the   limit that you are told, until your health care provider says that it is safe. This information is not intended to replace advice given to you by your health care provider. Make sure you discuss any questions you have with your health care provider. Document Revised: 12/07/2017 Document Reviewed: 12/07/2017 Elsevier Patient Education  2020 Elsevier Inc.    Moderate Conscious Sedation, Adult, Care After These instructions provide you with information about caring for yourself after your procedure. Your health care provider may also give you more specific instructions. Your  treatment has been planned according to current medical practices, but problems sometimes occur. Call your health care provider if you have any problems or questions after your procedure. What can I expect after the procedure? After your procedure, it is common:  To feel sleepy for several hours.  To feel clumsy and have poor balance for several hours.  To have poor judgment for several hours.  To vomit if you eat too soon. Follow these instructions at home: For at least 24 hours after the procedure:   Do not: ? Participate in activities where you could fall or become injured. ? Drive. ? Use heavy machinery. ? Drink alcohol. ? Take sleeping pills or medicines that cause drowsiness. ? Make important decisions or sign legal documents. ? Take care of children on your own.  Rest. Eating and drinking  Follow the diet recommended by your health care provider.  If you vomit: ? Drink water, juice, or soup when you can drink without vomiting. ? Make sure you have little or no nausea before eating solid foods. General instructions  Have a responsible adult stay with you until you are awake and alert.  Take over-the-counter and prescription medicines only as told by your health care provider.  If you smoke, do not smoke without supervision.  Keep all follow-up visits as told by your health care provider. This is important. Contact a health care provider if:  You keep feeling nauseous or you keep vomiting.  You feel light-headed.  You develop a rash.  You have a fever. Get help right away if:  You have trouble breathing. This information is not intended to replace advice given to you by your health care provider. Make sure you discuss any questions you have with your health care provider. Document Revised: 11/06/2017 Document Reviewed: 03/15/2016 Elsevier Patient Education  2020 Elsevier Inc.  

## 2020-11-15 NOTE — Progress Notes (Signed)
Patient sitting up in bed. Tolerating soda and saltine crackers without difficulty. Denies pain at this time.

## 2020-11-15 NOTE — Telephone Encounter (Signed)
-----   Message from Minna Merritts, MD sent at 11/15/2020 11:55 AM EST ----- Cardiac catheterization performed November 15, 2020 by TG Leaving the hospital today Needs PCI  of the LAD in [facility] with atherectomy, Dr. Fletcher Anon Can you call the patient on Friday, December 10 or early next week to arrange [facility] procedure We can confirm she is on aspirin and Plavix.  I called in prescription Thx TGollan

## 2020-11-15 NOTE — H&P (View-Only) (Signed)
Patient sitting up in bed. Tolerating soda and saltine crackers without difficulty. Denies pain at this time.

## 2020-11-16 ENCOUNTER — Telehealth: Payer: Self-pay | Admitting: Cardiovascular Disease

## 2020-11-16 NOTE — Telephone Encounter (Signed)
Patient asks if she should take her aspirin while she is taking her Plavix. Please call to discuss.

## 2020-11-16 NOTE — Telephone Encounter (Signed)
Please see telephone note from 12/9 for further detail. I just spoke with pt and confirmed that she SHOULD take both ASA 81mg  QD and Plavix 75mg  QD and to also make sure she takes morning of cath procedure 11/21/20. Pt will take Plavix 75mg  every evening and ASA 81mg  every morning.

## 2020-11-16 NOTE — Telephone Encounter (Signed)
-----   Message from Wellington Hampshire, MD sent at 11/16/2020  8:55 AM EST ----- Regarding: RE: Pt needs PCI per TG I can do on 12/15 at 1:30 pm    ----- Message ----- From: Solmon Ice, RN Sent: 11/16/2020   8:49 AM EST To: Wellington Hampshire, MD Subject: Pt needs PCI per TG                            Dr. Fletcher Anon,  Dr. Rockey Situ sent a message to triage asking [place] to call this pt - she needs PCI of the LAD in [facility] with atherectomy with you. She had cath yesterday 12/9. He asked to get her in early next week for [facility]. When would you like me to put her on? (I was unable to send you secure chat)  Thanks,  Jinny Blossom

## 2020-11-16 NOTE — Telephone Encounter (Signed)
Spoke to pt. Reviewed instructions detailed below. Pt is scheduled for PCI with atherectomy 11/21/20 with Dr. Fletcher Anon at [facility] @ 1:30PM Arrival time 11:30AM. Verified that pt has been taking both ASA 81mg  and Plavix 75mg  daily. Pt instructed to take her Plavix every evening and ASA every morning. Pt verbalized that she WILL take Plavix night before procedure and ASA morning of test. Reviewed medications and allergies with pt. Pt verbalized that she will have someone drive her to and from procedure and to be with her 24 hrs post procedure. Pt will have repeat Bmet and CBC and Covid test Monday 12/13 @ [place], this has been scheduled. Lab orders placed. Pt has no further questions at this time. Precert has been sent.   You are scheduled for a Cardiac Catheterization PCI with atherectomy on Wednesday, December 15 with Dr. [name].  1. Please arrive at the [facility] (Main Entrance A) at [facility]: [address] [place], [place] [address] at 11:30 AM (This time is two hours before your procedure to ensure your preparation). Free valet parking service is available.   Special note: Every effort is made to have your procedure done on time. Please understand that emergencies sometimes delay scheduled procedures.  2. Diet: Do not eat solid foods after midnight.  The patient may have clear liquids until 5am upon the day of the procedure.  3. Labs: You will need to have blood drawn on Monday, December 13 at [facility], Go to 1st desk on your right to register.  Address: [address], [place] [address]  Open: 8am - 5pm  Phone: [phone]. You do not need to be fasting.  COVID PRE- TEST: You will need a COVID TEST prior to the procedure:  LOCATION: [address]-Thru Testing site.  DATE/TIME:  Monday December 13 anytime between [place]   4. Medication instructions in preparation for your procedure:   Contrast Allergy: No   Do not take  Lasix (Furosemide) the MORNING of your procedure   On the morning of your procedure, take your Aspirin and Plavix any morning medicines NOT listed above.  You may use sips of water. (Pt takes Plavix in evening and will take night before procedure).  5. Plan for one night stay--bring personal belongings. 6. Bring a current list of your medications and current insurance cards. 7. You MUST have a responsible person to drive you home. 8. Someone MUST be with you the first 24 hours after you arrive home or your discharge will be delayed. 9. Please wear clothes that are easy to get on and off and wear slip-on shoes.

## 2020-11-18 NOTE — Interval H&P Note (Signed)
History and Physical Interval Note:  11/18/2020 10:32 AM  Erin [name]  has presented today for surgery, with the diagnosis of RT LT Heart Cath   Unstable angina  [name].  The various methods of treatment have been discussed with the patient and family. After consideration of risks, benefits and other options for treatment, the patient has consented to  Procedure(s): RIGHT/LEFT HEART CATH AND CORONARY ANGIOGRAPHY poss intervention (N/A) as a surgical intervention.  The patient's history has been reviewed, patient examined, no change in status, stable for surgery.  I have reviewed the patient's chart and labs.  Questions were answered to the patient's satisfaction.     Erin [name]

## 2020-11-18 NOTE — H&P (Signed)
H&P Addendum, precardiac catheterization  Patient was seen and evaluated prior to Cardiac catheterization procedure Symptoms, prior testing details again confirmed with the patient Patient examined, no significant change from prior exam Lab work reviewed in detail personally by myself Patient understands risk and benefit of the procedure, willing to proceed  Signed, Tim Mickelle Goupil, MD, Ph.D [facility]    

## 2020-11-18 NOTE — H&P (View-Only) (Signed)
H&P Addendum, precardiac catheterization  Patient was seen and evaluated prior to Cardiac catheterization procedure Symptoms, prior testing details again confirmed with the patient Patient examined, no significant change from prior exam Lab work reviewed in detail personally by myself Patient understands risk and benefit of the procedure, willing to proceed  Signed, Tim Tylia Ewell, MD, Ph.D [facility]    

## 2020-11-19 ENCOUNTER — Other Ambulatory Visit
Admission: RE | Admit: 2020-11-19 | Discharge: 2020-11-19 | Disposition: A | Discharge status: Home / Self Care | Payer: Medicare Other | Source: Home / Self Care | Attending: Cardiovascular Disease | Admitting: Cardiovascular Disease

## 2020-11-19 ENCOUNTER — Other Ambulatory Visit
Admission: RE | Admit: 2020-11-19 | Discharge: 2020-11-19 | Disposition: A | Discharge status: Home / Self Care | Payer: Medicare Other | Source: Ambulatory Visit | Attending: Cardiovascular Disease | Admitting: Cardiovascular Disease

## 2020-11-19 ENCOUNTER — Other Ambulatory Visit: Payer: Self-pay

## 2020-11-19 DIAGNOSIS — Z20822 Contact with and (suspected) exposure to covid-19: Secondary | ICD-10-CM | POA: Diagnosis not present

## 2020-11-19 DIAGNOSIS — I25118 Atherosclerotic heart disease of native coronary artery with other forms of angina pectoris: Secondary | ICD-10-CM

## 2020-11-19 DIAGNOSIS — Z01812 Encounter for preprocedural laboratory examination: Secondary | ICD-10-CM

## 2020-11-19 LAB — CBC
HCT: 39.6 % (ref 36.0–46.0)
Hemoglobin: 12.7 g/dL (ref 12.0–15.0)
MCH: 29.3 pg (ref 26.0–34.0)
MCHC: 32.1 g/dL (ref 30.0–36.0)
MCV: 91.5 fL (ref 80.0–100.0)
Platelets: 323 10*3/uL (ref 150–400)
RBC: 4.33 MIL/uL (ref 3.87–5.11)
RDW: 15.4 % (ref 11.5–15.5)
WBC: 9.9 10*3/uL (ref 4.0–10.5)
nRBC: 0 % (ref 0.0–0.2)

## 2020-11-19 LAB — BASIC METABOLIC PANEL
Anion gap: 10 (ref 5–15)
BUN: 11 mg/dL (ref 8–23)
CO2: 24 mmol/L (ref 22–32)
Calcium: 8.4 mg/dL — ABNORMAL LOW (ref 8.9–10.3)
Chloride: 104 mmol/L (ref 98–111)
Creatinine, Ser: 0.86 mg/dL (ref 0.44–1.00)
GFR, Estimated: >60 mL/min (ref >60)
Glucose, Bld: 100 mg/dL — ABNORMAL HIGH (ref 70–99)
Potassium: 3.9 mmol/L (ref 3.5–5.1)
Sodium: 138 mmol/L (ref 135–145)

## 2020-11-20 ENCOUNTER — Other Ambulatory Visit: Payer: Medicare Other

## 2020-11-20 ENCOUNTER — Telehealth: Payer: Self-pay | Admitting: *Deleted

## 2020-11-20 LAB — SARS CORONAVIRUS 2 (TAT 6-24 HRS): SARS Coronavirus 2: NEGATIVE

## 2020-11-20 NOTE — Telephone Encounter (Signed)
Pt contacted pre-coronary stent intervention scheduled at [facility] for: Wednesday November 21, 2020 1:30 PM Verified arrival time and place: [facility]) at: 11:30 AM   No solid food after midnight prior to cath, clear liquids until 5 AM day of procedure.  Hold: Lasix-AM of procedure  Except hold medications AM meds can be  taken pre-cath with sips of water including: ASA 81 mg Plavix 75 mg  Confirmed patient has responsible adult to drive home post procedure and be with patient first 24 hours after arriving home: * see notes below  You are allowed ONE visitor in the waiting room during the time you are at the hospital for your procedure. Both you and your visitor must wear a mask once you enter the hospital.       COVID-19 Pre-Screening Questions:  . In the past 14 days have you had any symptoms concerning for COVID-19 infection (fever, chills, cough, or new shortness of breath)? no . In the past 14 days have you been around anyone with known Covid 19?  No  Reviewed procedure/mask/visitor instructions, COVID-19 questions with patient.                    * Pt reports that she would not have responsible adult to be with her the first 24 home if she is same day discharge, she would have transportation.                     Pt reports her circumstances would be the same regardless of day/time of procedure.

## 2020-11-20 NOTE — Telephone Encounter (Signed)
I am planning to do an atherectomy and I usually keep these patients overnight.

## 2020-11-21 ENCOUNTER — Ambulatory Visit (HOSPITAL_COMMUNITY)
Admission: RE | Disposition: A | Discharge status: Home / Self Care | Payer: Self-pay | Source: Home / Self Care | Attending: Cardiovascular Disease

## 2020-11-21 ENCOUNTER — Encounter (HOSPITAL_COMMUNITY): Payer: Self-pay | Admitting: Cardiovascular Disease

## 2020-11-21 ENCOUNTER — Ambulatory Visit (HOSPITAL_COMMUNITY)
Admission: RE | Admit: 2020-11-21 | Discharge: 2020-11-22 | Disposition: A | Discharge status: Home / Self Care | Payer: Medicare Other | Attending: Cardiovascular Disease | Admitting: Cardiovascular Disease

## 2020-11-21 ENCOUNTER — Other Ambulatory Visit: Payer: Self-pay

## 2020-11-21 DIAGNOSIS — I209 Angina pectoris, unspecified: Secondary | ICD-10-CM | POA: Diagnosis present

## 2020-11-21 DIAGNOSIS — I2510 Atherosclerotic heart disease of native coronary artery without angina pectoris: Secondary | ICD-10-CM

## 2020-11-21 DIAGNOSIS — Z888 Allergy status to other drugs, medicaments and biological substances status: Secondary | ICD-10-CM | POA: Diagnosis not present

## 2020-11-21 DIAGNOSIS — M349 Systemic sclerosis, unspecified: Secondary | ICD-10-CM | POA: Diagnosis not present

## 2020-11-21 DIAGNOSIS — E785 Hyperlipidemia, unspecified: Secondary | ICD-10-CM | POA: Diagnosis not present

## 2020-11-21 DIAGNOSIS — I7300 Raynaud's syndrome without gangrene: Secondary | ICD-10-CM | POA: Diagnosis not present

## 2020-11-21 DIAGNOSIS — R0602 Shortness of breath: Secondary | ICD-10-CM | POA: Diagnosis present

## 2020-11-21 DIAGNOSIS — Z7982 Long term (current) use of aspirin: Secondary | ICD-10-CM | POA: Insufficient documentation

## 2020-11-21 DIAGNOSIS — J84112 Idiopathic pulmonary fibrosis: Secondary | ICD-10-CM | POA: Insufficient documentation

## 2020-11-21 DIAGNOSIS — Z801 Family history of malignant neoplasm of trachea, bronchus and lung: Secondary | ICD-10-CM | POA: Insufficient documentation

## 2020-11-21 DIAGNOSIS — Z836 Family history of other diseases of the respiratory system: Secondary | ICD-10-CM | POA: Diagnosis not present

## 2020-11-21 DIAGNOSIS — Z9861 Coronary angioplasty status: Secondary | ICD-10-CM

## 2020-11-21 DIAGNOSIS — I25119 Atherosclerotic heart disease of native coronary artery with unspecified angina pectoris: Secondary | ICD-10-CM | POA: Diagnosis not present

## 2020-11-21 DIAGNOSIS — I2584 Coronary atherosclerosis due to calcified coronary lesion: Secondary | ICD-10-CM | POA: Diagnosis not present

## 2020-11-21 DIAGNOSIS — I10 Essential (primary) hypertension: Secondary | ICD-10-CM | POA: Diagnosis present

## 2020-11-21 DIAGNOSIS — Z955 Presence of coronary angioplasty implant and graft: Secondary | ICD-10-CM

## 2020-11-21 DIAGNOSIS — Z882 Allergy status to sulfonamides status: Secondary | ICD-10-CM | POA: Insufficient documentation

## 2020-11-21 DIAGNOSIS — Z79899 Other long term (current) drug therapy: Secondary | ICD-10-CM | POA: Insufficient documentation

## 2020-11-21 HISTORY — PX: CORONARY STENT INTERVENTION: CATH118234

## 2020-11-21 HISTORY — PX: CORONARY ATHERECTOMY: CATH118238

## 2020-11-21 LAB — POCT ACTIVATED CLOTTING TIME
Activated Clotting Time: 220 seconds
Activated Clotting Time: 291 seconds
Activated Clotting Time: 565 seconds

## 2020-11-21 SURGERY — CORONARY STENT INTERVENTION
Anesthesia: LOCAL

## 2020-11-21 MED ORDER — CLOPIDOGREL BISULFATE 75 MG PO TABS
75.0000 mg | ORAL_TABLET | Freq: Every day | ORAL | Status: DC
  Administered 2020-11-22: 08:00:00 75 mg via ORAL
  Filled 2020-11-21: qty 1

## 2020-11-21 MED ORDER — SODIUM CHLORIDE 0.9 % WEIGHT BASED INFUSION
1.0000 mL/kg/h | INTRAVENOUS | Status: AC
  Administered 2020-11-21: 16:00:00 1 mL/kg/h via INTRAVENOUS

## 2020-11-21 MED ORDER — ONDANSETRON HCL 4 MG/2ML IJ SOLN: 4.0000 mg | Freq: Four times a day (QID) | INTRAMUSCULAR | Status: DC | PRN

## 2020-11-21 MED ORDER — VIPERSLIDE LUBRICANT OPTIME: TOPICAL | Status: DC | PRN

## 2020-11-21 MED ORDER — ASPIRIN EC 81 MG PO TBEC
81.0000 mg | DELAYED_RELEASE_TABLET | Freq: Every day | ORAL | Status: DC
  Administered 2020-11-22: 08:00:00 81 mg via ORAL
  Filled 2020-11-21: qty 1

## 2020-11-21 MED ORDER — FUROSEMIDE 20 MG PO TABS
20.0000 mg | ORAL_TABLET | Freq: Every day | ORAL | Status: DC
  Administered 2020-11-21 – 2020-11-22 (×2): 20 mg via ORAL
  Filled 2020-11-21 (×2): qty 1

## 2020-11-21 MED ORDER — SODIUM CHLORIDE 0.9% FLUSH: 3.0000 mL | INTRAVENOUS | Status: DC | PRN

## 2020-11-21 MED ORDER — HEPARIN (PORCINE) IN NACL 1000-0.9 UT/500ML-% IV SOLN
INTRAVENOUS | Status: DC | PRN
  Administered 2020-11-21 (×2): 500 mL

## 2020-11-21 MED ORDER — DULOXETINE HCL 60 MG PO CPEP
60.0000 mg | ORAL_CAPSULE | Freq: Every day | ORAL | Status: DC
  Administered 2020-11-22: 08:00:00 60 mg via ORAL
  Filled 2020-11-21: qty 1

## 2020-11-21 MED ORDER — MIDAZOLAM HCL 2 MG/2ML IJ SOLN
INTRAMUSCULAR | Status: AC
  Filled 2020-11-21: qty 2

## 2020-11-21 MED ORDER — HYDROXYCHLOROQUINE SULFATE 200 MG PO TABS
200.0000 mg | ORAL_TABLET | Freq: Two times a day (BID) | ORAL | Status: DC
  Administered 2020-11-21 – 2020-11-22 (×2): 200 mg via ORAL
  Filled 2020-11-21 (×2): qty 1

## 2020-11-21 MED ORDER — LATANOPROST 0.005 % OP SOLN
1.0000 [drp] | Freq: Every day | OPHTHALMIC | Status: DC
  Administered 2020-11-21: 22:00:00 1 [drp] via OPHTHALMIC
  Filled 2020-11-21 (×2): qty 2.5

## 2020-11-21 MED ORDER — EZETIMIBE 10 MG PO TABS
10.0000 mg | ORAL_TABLET | Freq: Every day | ORAL | Status: DC
  Administered 2020-11-21 – 2020-11-22 (×2): 10 mg via ORAL
  Filled 2020-11-21 (×2): qty 1

## 2020-11-21 MED ORDER — NITROGLYCERIN IN D5W 200-5 MCG/ML-% IV SOLN
INTRAVENOUS | Status: AC
  Filled 2020-11-21: qty 250

## 2020-11-21 MED ORDER — IOHEXOL 350 MG/ML SOLN
INTRAVENOUS | Status: AC
  Filled 2020-11-21: qty 1

## 2020-11-21 MED ORDER — MIDAZOLAM HCL 2 MG/2ML IJ SOLN
INTRAMUSCULAR | Status: DC | PRN
  Administered 2020-11-21: 1 mg via INTRAVENOUS

## 2020-11-21 MED ORDER — HEPARIN SODIUM (PORCINE) 1000 UNIT/ML IJ SOLN
INTRAMUSCULAR | Status: DC | PRN
  Administered 2020-11-21: 7000 [IU] via INTRAVENOUS

## 2020-11-21 MED ORDER — PREDNISONE 5 MG PO TABS
5.0000 mg | ORAL_TABLET | Freq: Two times a day (BID) | ORAL | Status: DC
  Administered 2020-11-21 – 2020-11-22 (×2): 5 mg via ORAL
  Filled 2020-11-21 (×2): qty 1

## 2020-11-21 MED ORDER — SODIUM CHLORIDE 0.9 % IV SOLN: 250.0000 mL | INTRAVENOUS | Status: DC | PRN

## 2020-11-21 MED ORDER — ALBUTEROL SULFATE (2.5 MG/3ML) 0.083% IN NEBU: 2.5000 mg | INHALATION_SOLUTION | RESPIRATORY_TRACT | Status: DC | PRN

## 2020-11-21 MED ORDER — SODIUM CHLORIDE 0.9 % IV SOLN
INTRAVENOUS | Status: AC | PRN
  Administered 2020-11-21: 100 mL/h via INTRAVENOUS

## 2020-11-21 MED ORDER — ALPRAZOLAM 0.5 MG PO TABS: 0.5000 mg | ORAL_TABLET | Freq: Four times a day (QID) | ORAL | Status: DC | PRN

## 2020-11-21 MED ORDER — SODIUM CHLORIDE 0.9% FLUSH
3.0000 mL | Freq: Two times a day (BID) | INTRAVENOUS | Status: DC
  Administered 2020-11-21: 21:00:00 3 mL via INTRAVENOUS

## 2020-11-21 MED ORDER — SODIUM CHLORIDE 0.9% FLUSH: 3.0000 mL | Freq: Two times a day (BID) | INTRAVENOUS | Status: DC

## 2020-11-21 MED ORDER — NINTEDANIB ESYLATE 150 MG PO CAPS: 150.0000 mg | ORAL_CAPSULE | Freq: Every day | ORAL | Status: DC

## 2020-11-21 MED ORDER — ADULT MULTIVITAMIN W/MINERALS CH
1.0000 | ORAL_TABLET | Freq: Every day | ORAL | Status: DC
  Administered 2020-11-21 – 2020-11-22 (×2): 1 via ORAL
  Filled 2020-11-21 (×2): qty 1

## 2020-11-21 MED ORDER — SODIUM CHLORIDE 0.9 % WEIGHT BASED INFUSION: 3.0000 mL/kg/h | INTRAVENOUS | Status: DC

## 2020-11-21 MED ORDER — SODIUM CHLORIDE 0.9 % WEIGHT BASED INFUSION: 1.0000 mL/kg/h | INTRAVENOUS | Status: DC

## 2020-11-21 MED ORDER — SODIUM CHLORIDE 0.9 % IV SOLN
250.0000 mL | INTRAVENOUS | Status: DC | PRN
Start: 2020-11-22 — End: 2020-11-22

## 2020-11-21 MED ORDER — PANTOPRAZOLE SODIUM 40 MG PO TBEC
40.0000 mg | DELAYED_RELEASE_TABLET | Freq: Every day | ORAL | Status: DC
  Administered 2020-11-21 – 2020-11-22 (×2): 40 mg via ORAL
  Filled 2020-11-21 (×2): qty 1

## 2020-11-21 MED ORDER — ASPIRIN 81 MG PO CHEW: 81.0000 mg | CHEWABLE_TABLET | ORAL | Status: DC

## 2020-11-21 MED ORDER — LIDOCAINE HCL (PF) 1 % IJ SOLN
INTRAMUSCULAR | Status: AC
  Filled 2020-11-21: qty 30

## 2020-11-21 MED ORDER — CLOPIDOGREL BISULFATE 75 MG PO TABS: 75.0000 mg | ORAL_TABLET | ORAL | Status: DC

## 2020-11-21 MED ORDER — NITROGLYCERIN 1 MG/10 ML FOR IR/CATH LAB
INTRA_ARTERIAL | Status: AC
  Filled 2020-11-21: qty 10

## 2020-11-21 MED ORDER — FENTANYL CITRATE (PF) 100 MCG/2ML IJ SOLN
INTRAMUSCULAR | Status: DC | PRN
  Administered 2020-11-21 (×2): 50 ug via INTRAVENOUS

## 2020-11-21 MED ORDER — HEPARIN SODIUM (PORCINE) 1000 UNIT/ML IJ SOLN
INTRAMUSCULAR | Status: AC
  Filled 2020-11-21: qty 1

## 2020-11-21 MED ORDER — ACETAMINOPHEN 325 MG PO TABS: 650.0000 mg | ORAL_TABLET | ORAL | Status: DC | PRN

## 2020-11-21 MED ORDER — NITROGLYCERIN 1 MG/10 ML FOR IR/CATH LAB
INTRA_ARTERIAL | Status: DC | PRN
  Administered 2020-11-21 (×2): 200 ug via INTRACORONARY

## 2020-11-21 MED ORDER — POLYETHYLENE GLYCOL 3350 17 G PO PACK: 17.0000 g | PACK | Freq: Every day | ORAL | Status: DC | PRN

## 2020-11-21 MED ORDER — FENTANYL CITRATE (PF) 100 MCG/2ML IJ SOLN
INTRAMUSCULAR | Status: AC
  Filled 2020-11-21: qty 2

## 2020-11-21 MED ORDER — ATORVASTATIN CALCIUM 40 MG PO TABS
40.0000 mg | ORAL_TABLET | Freq: Every day | ORAL | Status: DC
  Administered 2020-11-21 – 2020-11-22 (×2): 40 mg via ORAL
  Filled 2020-11-21 (×2): qty 1

## 2020-11-21 MED ORDER — GABAPENTIN 600 MG PO TABS
600.0000 mg | ORAL_TABLET | Freq: Three times a day (TID) | ORAL | Status: DC
  Administered 2020-11-21 – 2020-11-22 (×3): 600 mg via ORAL
  Filled 2020-11-21 (×3): qty 1

## 2020-11-21 MED ORDER — IOHEXOL 350 MG/ML SOLN
INTRAVENOUS | Status: DC | PRN
  Administered 2020-11-21: 15:00:00 125 mL via INTRA_ARTERIAL

## 2020-11-21 MED ORDER — AMLODIPINE BESYLATE 5 MG PO TABS
5.0000 mg | ORAL_TABLET | Freq: Every day | ORAL | Status: DC
  Administered 2020-11-22: 08:00:00 5 mg via ORAL
  Filled 2020-11-21 (×2): qty 1

## 2020-11-21 MED ORDER — HEPARIN (PORCINE) IN NACL 1000-0.9 UT/500ML-% IV SOLN
INTRAVENOUS | Status: AC
  Filled 2020-11-21: qty 1000

## 2020-11-21 MED ORDER — LIDOCAINE HCL (PF) 1 % IJ SOLN
INTRAMUSCULAR | Status: DC | PRN
  Administered 2020-11-21: 15 mL

## 2020-11-21 MED ORDER — ZOLPIDEM TARTRATE 5 MG PO TABS
5.0000 mg | ORAL_TABLET | Freq: Every evening | ORAL | Status: DC | PRN
  Administered 2020-11-21: 22:00:00 5 mg via ORAL
  Filled 2020-11-21: qty 1

## 2020-11-21 SURGICAL SUPPLY — 22 items
BALLN SAPPHIRE ~~LOC~~ 2.75X12 (BALLOONS) ×2 IMPLANT
BALLN SAPPHIRE ~~LOC~~ 3.0X10 (BALLOONS) ×2 IMPLANT
CATH VISTA GUIDE 7FR XB 3.5 (CATHETERS) ×2 IMPLANT
CATH VISTA GUIDE 7FRXB LAD 3.5 (CATHETERS) ×2 IMPLANT
CLOSURE PERCLOSE PROSTYLE (VASCULAR PRODUCTS) ×2 IMPLANT
CROWN DIAMONDBACK CLASSIC 1.25 (BURR) ×2 IMPLANT
ELECT DEFIB PAD ADLT CADENCE (PAD) ×2 IMPLANT
FEM STOP ARCH (HEMOSTASIS) ×1
KIT ENCORE 26 ADVANTAGE (KITS) ×2 IMPLANT
KIT HEART LEFT (KITS) ×2 IMPLANT
KIT MICROPUNCTURE NIT STIFF (SHEATH) ×2 IMPLANT
LUBRICANT VIPERSLIDE CORONARY (MISCELLANEOUS) ×2 IMPLANT
PACK CARDIAC CATHETERIZATION (CUSTOM PROCEDURE TRAY) ×2 IMPLANT
SHEATH PINNACLE 7F 10CM (SHEATH) ×2 IMPLANT
SHEATH PROBE COVER 6X72 (BAG) ×2 IMPLANT
STENT RESOLUTE ONYX 2.75X18 (Permanent Stent) ×2 IMPLANT
SYSTEM COMPRESSION FEMOSTOP (HEMOSTASIS) ×1 IMPLANT
TRANSDUCER W/STOPCOCK (MISCELLANEOUS) ×2 IMPLANT
TUBING CIL FLEX 10 FLL-RA (TUBING) ×2 IMPLANT
WIRE EMERALD 3MM-J .035X150CM (WIRE) ×2 IMPLANT
WIRE RUNTHROUGH .014X180CM (WIRE) ×2 IMPLANT
WIRE VIPERWIRE COR FLEX .012 (WIRE) ×2 IMPLANT

## 2020-11-21 NOTE — Discharge Instructions (Signed)
Femoral Site Care This sheet gives you information about how to care for yourself after your procedure. Your health care provider may also give you more specific instructions. If you have problems or questions, contact your health care provider. What can I expect after the procedure? After the procedure, it is common to have:  Bruising that usually fades within 1-2 weeks.  Tenderness at the site. Follow these instructions at home: Wound care  Follow instructions from your health care provider about how to take care of your insertion site. Make sure you: ? Wash your hands with soap and water before you change your bandage (dressing). If soap and water are not available, use hand sanitizer. ? Change your dressing as told by your health care provider. ? .  Do not take baths, swim, or use a hot tub until your health care provider approves.  You may shower 24-48 hours after the procedure or as told by your health care provider. ? Gently wash the site with plain soap and water. ? Pat the area dry with a clean towel. ? Do not rub the site. This may cause bleeding.  Do not apply powder or lotion to the site. Keep the site clean and dry.  Check your femoral site every day for signs of infection. Check for: ? Redness, swelling, or pain. ? Fluid or blood. ? Warmth. ? Pus or a bad smell. Activity  For the first 2-3 days after your procedure, or as long as directed: ? Avoid climbing stairs as much as possible. ? Do not squat.  Do not lift anything that is heavier than 10 lb (4.5 kg), or the limit that you are told, until your health care provider says that it is safe.  Rest as directed. ? Avoid sitting for a long time without moving. Get up to take short walks every 1-2 hours.  Do not drive for 24 hours if you were given a medicine to help you relax (sedative). General instructions  Take over-the-counter and prescription medicines only as told by your health care provider.  Keep all  follow-up visits as told by your health care provider. This is important. Contact a health care provider if you have:  A fever or chills.  You have redness, swelling, or pain around your insertion site. Get help right away if:  The catheter insertion area swells very fast.  You pass out.  You suddenly start to sweat or your skin gets clammy.  The catheter insertion area is bleeding, and the bleeding does not stop when you hold steady pressure on the area.  The area near or just beyond the catheter insertion site becomes pale, cool, tingly, or numb. These symptoms may represent a serious problem that is an emergency. Do not wait to see if the symptoms will go away. Get medical help right away. Call your local emergency services (911 in the [place] Do not drive yourself to the hospital. Summary  After the procedure, it is common to have bruising that usually fades within 1-2 weeks.  Check your femoral site every day for signs of infection.  Do not lift anything that is heavier than 10 lb (4.5 kg), or the limit that you are told, until your health care provider says that it is safe. This information is not intended to replace advice given to you by your health care provider. Make sure you discuss any questions you have with your health care provider. Document Revised: 12/07/2017 Document Reviewed: 12/07/2017 Elsevier Patient Education  2020   Elsevier Inc.  

## 2020-11-21 NOTE — Interval H&P Note (Signed)
Cath Lab Visit (complete for each Cath Lab visit)  Clinical Evaluation Leading to the Procedure:   ACS: No.  Non-ACS:    Anginal Classification: CCS III  Anti-ischemic medical therapy: Maximal Therapy (2 or more classes of medications)  Non-Invasive Test Results: No non-invasive testing performed  Prior CABG: No previous CABG      History and Physical Interval Note:  11/21/2020 2:20 PM  Erin [name]  has presented today for surgery, with the diagnosis of [name].  The various methods of treatment have been discussed with the patient and family. After consideration of risks, benefits and other options for treatment, the patient has consented to  Procedure(s): CORONARY STENT INTERVENTION (N/A) CORONARY ATHERECTOMY (N/A) as a surgical intervention.  The patient's history has been reviewed, patient examined, no change in status, stable for surgery.  I have reviewed the patient's chart and labs.  Questions were answered to the patient's satisfaction.     Erin [name]

## 2020-11-22 ENCOUNTER — Encounter (HOSPITAL_COMMUNITY): Payer: Self-pay | Admitting: Cardiovascular Disease

## 2020-11-22 DIAGNOSIS — I10 Essential (primary) hypertension: Secondary | ICD-10-CM

## 2020-11-22 DIAGNOSIS — E782 Mixed hyperlipidemia: Secondary | ICD-10-CM | POA: Diagnosis not present

## 2020-11-22 DIAGNOSIS — Z9861 Coronary angioplasty status: Secondary | ICD-10-CM | POA: Diagnosis not present

## 2020-11-22 DIAGNOSIS — I2584 Coronary atherosclerosis due to calcified coronary lesion: Secondary | ICD-10-CM | POA: Diagnosis not present

## 2020-11-22 DIAGNOSIS — I2510 Atherosclerotic heart disease of native coronary artery without angina pectoris: Secondary | ICD-10-CM | POA: Diagnosis not present

## 2020-11-22 DIAGNOSIS — I25119 Atherosclerotic heart disease of native coronary artery with unspecified angina pectoris: Secondary | ICD-10-CM | POA: Diagnosis not present

## 2020-11-22 DIAGNOSIS — J84112 Idiopathic pulmonary fibrosis: Secondary | ICD-10-CM | POA: Diagnosis not present

## 2020-11-22 LAB — CBC
HCT: 32.6 % — ABNORMAL LOW (ref 36.0–46.0)
Hemoglobin: 10.7 g/dL — ABNORMAL LOW (ref 12.0–15.0)
MCH: 29.4 pg (ref 26.0–34.0)
MCHC: 32.8 g/dL (ref 30.0–36.0)
MCV: 89.6 fL (ref 80.0–100.0)
Platelets: 340 10*3/uL (ref 150–400)
RBC: 3.64 MIL/uL — ABNORMAL LOW (ref 3.87–5.11)
RDW: 15.3 % (ref 11.5–15.5)
WBC: 10 10*3/uL (ref 4.0–10.5)
nRBC: 0 % (ref 0.0–0.2)

## 2020-11-22 LAB — BASIC METABOLIC PANEL
Anion gap: 11 (ref 5–15)
BUN: 18 mg/dL (ref 8–23)
CO2: 23 mmol/L (ref 22–32)
Calcium: 8.4 mg/dL — ABNORMAL LOW (ref 8.9–10.3)
Chloride: 102 mmol/L (ref 98–111)
Creatinine, Ser: 0.86 mg/dL (ref 0.44–1.00)
GFR, Estimated: >60 mL/min (ref >60)
Glucose, Bld: 95 mg/dL (ref 70–99)
Potassium: 4.4 mmol/L (ref 3.5–5.1)
Sodium: 136 mmol/L (ref 135–145)

## 2020-11-22 MED ORDER — METOPROLOL TARTRATE 25 MG PO TABS: 12.5000 mg | ORAL_TABLET | Freq: Two times a day (BID) | ORAL | 3 refills | Status: DC

## 2020-11-22 MED ORDER — NITROGLYCERIN 0.4 MG SL SUBL: 0.4000 mg | SUBLINGUAL_TABLET | SUBLINGUAL | 0 refills | Status: DC | PRN

## 2020-11-22 MED ORDER — METOPROLOL TARTRATE 12.5 MG HALF TABLET
12.5000 mg | ORAL_TABLET | Freq: Two times a day (BID) | ORAL | Status: DC
  Administered 2020-11-22: 14:00:00 12.5 mg via ORAL
  Filled 2020-11-22 (×2): qty 1

## 2020-11-22 MED ORDER — NITROGLYCERIN 0.4 MG SL SUBL: 0.4000 mg | SUBLINGUAL_TABLET | SUBLINGUAL | Status: DC | PRN

## 2020-11-22 MED ORDER — ATORVASTATIN CALCIUM 40 MG PO TABS: 40.0000 mg | ORAL_TABLET | Freq: Every day | ORAL | 2 refills | Status: DC

## 2020-11-22 MED FILL — Nitroglycerin IV Soln 200 MCG/ML in D5W: INTRAVENOUS | Qty: 250 | Status: AC

## 2020-11-22 NOTE — Discharge Summary (Addendum)
Discharge Summary    Patient ID: Erin [name] MRN: [id number]; DOB: 1946/05/28  Admit date: 11/21/2020 Discharge date: 11/22/2020  Primary Care Provider: Idelle Crouch, MD  Primary Cardiologist: Dr. Rockey Situ   Discharge Diagnoses    Principal Problem:   [name] S/P percutaneous coronary angioplasty Active Problems:   SOB (shortness of breath)   Scleroderma ([place])   Raynaud's disease   Hyperlipidemia   HTN, goal below 140/90   Angina pectoris [facility])  Diagnostic Studies/Procedures    Coronary stent intervention with orbital arthrectomy 11/21/20:   Mid LAD lesion is 80% stenosed.  Prox RCA lesion is 30% stenosed.  Post intervention, there is a 0% residual stenosis.  A drug-eluting stent was successfully placed using a [place] [id number].   Successful orbital atherectomy and drug-eluting stent placement to the mid LAD.  Recommendations: Dual antiplatelet therapy for at least 6 months. Aggressive treatment of risk factors. We will observe overnight and likely discharge home tomorrow morning.  Diagnostic Dominance: Right    Intervention       History of Present Illness     Erin [name] is a 74 y.o. female with ha hx of [name] per CT 09/2015 with mid to distal LAD stenosis, complicated scleroderma, Raynaud's, and ILD followed by pulmonology. She was recently seen by Dr. Rockey Situ for SOB at which time, plans were to proceed with [facility] given SOB with a hx of both chronic pulmonary and cardiac disease. Initial procedure performed 11/15/20 which showed severe single vessel mid LAD disease with an 80% mid LAD lesion. Plan was for planned arthrectomy and stenting.     Hospital Course     She was admitted to [facility] on 11/21/20 for coronary stent intervention with orbital arthrectomy. A PCI/DES was placed to the mid LAD without complication. Plan is for DAPT for at least 6 months and aggressive risk factor modification. She was kept overnight and had no  complications. Cath site (femoral) stable without signs of bleeding or hematoma.   The patient was seen by cardiac rehab and ambulated without chest pain. She did have slight dizziness after movement which resolved quickly. Post cath labs stable with a creatinine at 0.86 and Hb at 10.7 on day of discharge.   She will be continued on ASA 81, Plavix 75. We will add metoprolol tartrate 12.5mg  PO BID to her regimen. Her simvastatin was changed to atorvastatin.  Consultants: None   General: Well developed, well nourished, NAD Neck: Negative for carotid bruits. No JVD Lungs:Clear to ausculation bilaterally. No wheezes, rales, or rhonchi. Breathing is unlabored. Cardiovascular: RRR with S1 S2. No murmurs Abdomen: Soft, non-tender, non-distended. No obvious abdominal masses. Extremities: No edema. Radial pulses 2+ bilaterally Neuro: Alert and oriented. No focal deficits. No facial asymmetry. MAE spontaneously. Psych: Responds to questions appropriately with normal affect.    The patient has been seen and examined by Dr. Gardiner Rhyme who feels that she is stable and ready for discharge today, 11/22/20.   Did the patient have an acute coronary syndrome (MI, NSTEMI, STEMI, etc) this admission?:  No                               Did the patient have a percutaneous coronary intervention (stent / angioplasty)?:  Yes.     Cath/PCI Registry Performance & Quality Measures: 1. Aspirin prescribed? - Yes 2. ADP Receptor Inhibitor (Plavix/Clopidogrel, Brilinta/Ticagrelor or Effient/Prasugrel) prescribed (includes medically managed patients)? - Yes 3.  High Intensity Statin (Lipitor 40-80mg  or Crestor 20-40mg ) prescribed? - Yes 4. For EF <40%, was ACEI/ARB prescribed? - Yes 5. For EF <40%, Aldosterone Antagonist (Spironolactone or Eplerenone) prescribed? - Not Applicable (EF >/= 37%) 6. Cardiac Rehab Phase II ordered? - Yes  ____________  Discharge Vitals Blood pressure 113/61, pulse 83, temperature 98.4 F  (36.9 C), temperature source Oral, resp. rate 18, height 5' (1.524 m), weight 73.5 kg, SpO2 98 %.  Filed Weights   11/21/20 1125 11/22/20 0424  Weight: 72.6 kg 73.5 kg   Labs & Radiologic Studies    CBC Recent Labs    11/22/20 0202  WBC 10.0  HGB 10.7*  HCT 32.6*  MCV 89.6  PLT 628   Basic Metabolic Panel Recent Labs    11/22/20 0202  NA 136  K 4.4  CL 102  CO2 23  GLUCOSE 95  BUN 18  CREATININE 0.86  CALCIUM 8.4*  _____________  CARDIAC CATHETERIZATION  Result Date: 11/21/2020  Mid LAD lesion is 80% stenosed.  Prox RCA lesion is 30% stenosed.  Post intervention, there is a 0% residual stenosis.  A drug-eluting stent was successfully placed using a [place] [id number].  Successful orbital atherectomy and drug-eluting stent placement to the mid LAD. Recommendations: Dual antiplatelet therapy for at least 6 months. Aggressive treatment of risk factors. We will observe overnight and likely discharge home tomorrow morning.   CARDIAC CATHETERIZATION  Result Date: 11/15/2020  Prox RCA lesion is 30% stenosed.  The left ventricular systolic function is normal.  LV end diastolic pressure is normal.  The left ventricular ejection fraction is 55-65% by visual estimate.  There is no mitral valve regurgitation.  LV end diastolic pressure is normal.  Mid LAD lesion is 80% stenosed.    Disposition   Pt is being discharged home today in good condition.  Follow-up Plans & Appointments    Follow-up Information    Loel Dubonnet, NP Follow up on 11/27/2020.   Specialty: Cardiology Why: at 2:30pm. Please arrive at 2:15pm for your appointment  Contact information: [address] [place] [address] [phone]              Discharge Instructions    Amb Referral to Cardiac Rehabilitation   Complete by: As directed    Diagnosis: Coronary Stents   After initial evaluation and assessments completed: Virtual Based Care may be provided  alone or in conjunction with Phase 2 Cardiac Rehab based on patient barriers.: Yes   Call MD for:  difficulty breathing, headache or visual disturbances   Complete by: As directed    Call MD for:  extreme fatigue   Complete by: As directed    Call MD for:  hives   Complete by: As directed    Call MD for:  persistant dizziness or light-headedness   Complete by: As directed    Call MD for:  persistant nausea and vomiting   Complete by: As directed    Call MD for:  redness, tenderness, or signs of infection (pain, swelling, redness, odor or green/yellow discharge around incision site)   Complete by: As directed    Call MD for:  severe uncontrolled pain   Complete by: As directed    Call MD for:  temperature >100.4   Complete by: As directed    Diet - low sodium heart healthy   Complete by: As directed    Discharge instructions   Complete by: As directed    No  driving for 5 days. No lifting over 5 lbs for 1 week. No sexual activity for 1 week. Keep procedure site clean & dry. If you notice increased pain, swelling, bleeding or pus, call/return!  You may shower, but no soaking baths/hot tubs/pools for 1 week.   PLEASE DO NOT MISS ANY DOSES OF YOUR PLAVIX!!!! Also keep a log of you blood pressures and bring back to your follow up appt. Please call the office with any questions.   Patients taking blood thinners should generally stay away from medicines like ibuprofen, Advil, Motrin, naproxen, and Aleve due to risk of stomach bleeding. You may take Tylenol as directed or talk to your primary doctor about alternatives.  Some studies suggest Prilosec/Omeprazole interacts with Plavix. If you have reflux symptoms you can use Protonix for less chance of interaction.  PLEASE ENSURE THAT YOU DO NOT RUN OUT OF YOUR PLAVIX. IF you have issues obtaining this medication due to cost please CALL the office 3-5 business days prior to running out in order to prevent missing doses of this medication.    Increase activity slowly   Complete by: As directed    PHASE II - CARDIAC REHAB   Complete by: As directed      Discharge Medications   Allergies as of 11/22/2020      Reactions   Remicade [infliximab] Shortness Of Breath, Itching   Sulfa Antibiotics Other (See Comments)   Other Reaction: tounge cracked   Actonel [risedronate Sodium] Other (See Comments)   Fosamax [alendronate Sodium]    GI Bleed   Procardia [nifedipine] Hives   Sulfur Swelling   Wellbutrin [bupropion] Anxiety      Medication List    STOP taking these medications   simvastatin 80 MG tablet Commonly known as: ZOCOR Replaced by: atorvastatin 40 MG tablet     TAKE these medications   albuterol 108 (90 Base) MCG/ACT inhaler Commonly known as: VENTOLIN HFA Inhale 2 puffs into the lungs every 4 (four) hours as needed for wheezing or shortness of breath.   ALPRAZolam 0.5 MG tablet Commonly known as: XANAX Take 1 tablet (0.5 mg total) by mouth 4 (four) times daily as needed for anxiety.   amLODipine 5 MG tablet Commonly known as: NORVASC Take 5 mg by mouth daily.   aspirin 81 MG tablet Take 81 mg by mouth daily.   atorvastatin 40 MG tablet Commonly known as: LIPITOR Take 1 tablet (40 mg total) by mouth daily. Start taking on: November 23, 2020 Replaces: simvastatin 80 MG tablet   clopidogrel 75 MG tablet Commonly known as: PLAVIX Take 1 tablet (75 mg total) by mouth daily.   dexlansoprazole 60 MG capsule Commonly known as: DEXILANT Take 1 capsule (60 mg total) by mouth daily. What changed: when to take this   DULoxetine 60 MG capsule Commonly known as: CYMBALTA Take 60 mg by mouth daily. In the morning.   ezetimibe 10 MG tablet Commonly known as: ZETIA Take 1 tablet (10 mg total) by mouth daily.   furosemide 20 MG tablet Commonly known as: LASIX Take 20 mg by mouth daily.   gabapentin 600 MG tablet Commonly known as: NEURONTIN Take 600 mg by mouth 3 (three) times daily.    hydroxychloroquine 200 MG tablet Commonly known as: PLAQUENIL Take 200 mg by mouth 2 (two) times daily.   latanoprost 0.005 % ophthalmic solution Commonly known as: XALATAN Place 1 drop into both eyes at bedtime.   metoprolol tartrate 25 MG tablet Commonly known as: LOPRESSOR  Take 0.5 tablets (12.5 mg total) by mouth 2 (two) times daily.   multivitamin with minerals Tabs tablet Take 1 tablet by mouth daily.   Ofev 150 MG Caps Generic drug: Nintedanib Take 1 capsule (150 mg total) by mouth daily.   polyethylene glycol 17 g packet Commonly known as: MIRALAX / GLYCOLAX Take 17 g by mouth daily as needed (constipation.).   predniSONE 5 MG tablet Commonly known as: DELTASONE Take 1 tablet (5 mg total) by mouth 2 (two) times daily with a meal. RESUME AFTER COMPLETING THE PREDNISONE TAPER ORDERED SEPARATELY   zolpidem 10 MG tablet Commonly known as: AMBIEN Take 1 tablet (10 mg total) by mouth at bedtime as needed for sleep.       Outstanding Labs/Studies   None    Duration of Discharge Encounter   Greater than 30 minutes including physician time.  Signed, Kathyrn Drown, NP 11/22/2020, 2:51 PM   Patient seen and examined.  Agree with above documentation.  Ms. Gilden is a 74 year old female with a history of [name], scleroderma, Raynaud's, ILD who was admitted on 11/21/2020 following PCI.  She underwent a cardiac catheterization for shortness of breath on 11/15/2020, which showed severe mid LAD disease.  She was admitted to [facility] yesterday for planned atherectomy and stenting with Dr. Fletcher Anon.  Underwent successful procedure yesterday, with DES to mid LAD without complication.  She will be discharged on aspirin, Plavix.  On exam:  GEN: in no acute distress HEENT: normal Neck: no JVD,  Cardiac: RRR; no murmurs, rubs, or gallops,no edema  Respiratory:  clear to auscultation bilaterally, normal work of breathing GI: soft, nontender, nondistended, + BS MS:  Femoral cath site  without hematoma Skin: warm and dry Neuro:  Alert and Oriented x 3, Strength and sensation are intact Psych: normal affect  Stable for discharge today.  Follow-up scheduled on 11/27/2020.  Donato Heinz, MD

## 2020-11-22 NOTE — Plan of Care (Signed)
  Problem: Health Behavior/Discharge Planning: Goal: Ability to manage health-related needs will improve Outcome: Adequate for Discharge   Problem: Clinical Measurements: Goal: Diagnostic test results will improve Outcome: Adequate for Discharge Goal: Cardiovascular complication will be avoided Outcome: Adequate for Discharge   Problem: Pain Managment: Goal: General experience of comfort will improve Outcome: Adequate for Discharge   Problem: Education: Goal: Understanding of CV disease, CV risk reduction, and recovery process will improve Outcome: Adequate for Discharge Goal: Individualized Educational Video(s) Outcome: Adequate for Discharge   Problem: Activity: Goal: Ability to return to baseline activity level will improve Outcome: Adequate for Discharge   Problem: Cardiovascular: Goal: Ability to achieve and maintain adequate cardiovascular perfusion will improve Outcome: Adequate for Discharge Goal: Vascular access site(s) Level 0-1 will be maintained Outcome: Adequate for Discharge   Problem: Health Behavior/Discharge Planning: Goal: Ability to safely manage health-related needs after discharge will improve Outcome: Adequate for Discharge

## 2020-11-22 NOTE — Progress Notes (Signed)
Femstop removed , no bleeding noted on Right groin site. Tolerated procedure well, cath site level 0., palpable pedal pulses.,dsg applied.

## 2020-11-22 NOTE — Progress Notes (Signed)
CARDIAC REHAB PHASE I   PRE:  Rate/Rhythm: 73 SR  BP:  Supine: 114/58  Sitting:   Standing:    SaO2: 95%RA  MODE:  Ambulation: 90 ft   POST:  Rate/Rhythm: 103 ST  BP:  Supine:   Sitting: 132/64,  105/67  Standing:    SaO2: 95%RA 1005-1110 Pt wanting to get OOB. Had not been up yet. Purewick leaked. Helped pt walk to bathroom and get cleaned up. Pt then walked 90 ft on RA with gait belt use, rolling walker and asst x 1. Pt c/o lightheadedness after walk. Took BP in recliner and 132/64.  Had pt sit while I educated her and retook BP. She still stated lightheaded and BP to 105/67. Pt has call light and knows to call for assistance when she needs to get up. Reviewed importance of plavix with stent. Reviewed NTG use, gave heart healthy diet and encouraged walking as tolerated when not lightheaded. Pt uses scooter at grocery store and walks in house. Gets Meals on Wheels. Discussed CRP 2 and referred to [facility] for protocol but pt stated she has no transportation.  Also with orthopedic issues and scleroderma, and arthritis pt does not think she could tolerate.    Graylon Good, RN BSN  11/22/2020 11:08 AM

## 2020-11-27 ENCOUNTER — Other Ambulatory Visit: Payer: Self-pay

## 2020-11-27 ENCOUNTER — Ambulatory Visit (INDEPENDENT_AMBULATORY_CARE_PROVIDER_SITE_OTHER): Payer: Medicare Other | Admitting: Family

## 2020-11-27 ENCOUNTER — Encounter: Payer: Self-pay | Admitting: Family

## 2020-11-27 VITALS — BP 110/68 | HR 66 | Ht 59.5 in | Wt 155.0 lb

## 2020-11-27 DIAGNOSIS — E785 Hyperlipidemia, unspecified: Secondary | ICD-10-CM

## 2020-11-27 DIAGNOSIS — I25118 Atherosclerotic heart disease of native coronary artery with other forms of angina pectoris: Secondary | ICD-10-CM | POA: Diagnosis not present

## 2020-11-27 MED ORDER — CLOPIDOGREL BISULFATE 75 MG PO TABS: 75.0000 mg | ORAL_TABLET | Freq: Every day | ORAL | 1 refills | Status: DC

## 2020-11-27 MED ORDER — ATORVASTATIN CALCIUM 40 MG PO TABS
40.0000 mg | ORAL_TABLET | Freq: Every day | ORAL | 1 refills | Status: DC
Start: 2020-11-27 — End: 2021-08-07

## 2020-11-27 MED ORDER — METOPROLOL TARTRATE 25 MG PO TABS: 12.5000 mg | ORAL_TABLET | Freq: Two times a day (BID) | ORAL | 1 refills | Status: DC

## 2020-11-27 NOTE — Progress Notes (Signed)
Office Visit    Patient Name: Erin [name] Date of Encounter: 11/27/2020  Primary Care Provider:  Idelle Crouch, MD Primary Cardiologist:  Ida Rogue, MD Electrophysiologist:  None   Chief Complaint    Erin [name] is a 74 y.o. female with a hx of coronary artery disease s/p atherectomy and DES to LAD in 11/21/2020, scleroderma, Raynaud's disease, hyperlipidemia, hypertension, ILD presents today for follow-up after cardiac catheterization and stent  Past Medical History    Past Medical History:  Diagnosis Date  . Anemia    h/o  . Cancer ([place])    melanoma x 3the last one was of her leg and surgeon removed it  . Colon polyp   . Complication of anesthesia    PT STATED THAT WITH JOINT SURGERY THAT SHE WAS GIVEN SOME TYPE OF ANESTHESIA THAT MADE HER HALLUCINATE   . Coronary artery calcification   . Coronary artery disease   . DDD (degenerative disc disease), lumbar   . Diverticulosis 01/20/2017  . Dysphagia   . Dyspnea    with exertion-unable to walk a mile without getting sob- dr sparks set pt up to see Dr Rockey Situ after 11-19-16 surgery  . Environmental allergies   . Esophageal dysmotility   . Fibrocystic breast disease   . GERD (gastroesophageal reflux disease)   . Heart murmur    asymptomatic  . Hyperlipidemia    unspecified  . Hypertension   . Mitral regurgitation   . Monilial esophagitis ([place]) 01/20/2017  . Obstipation   . Osteopenia   . Peptic ulcer disease   . Pulmonary fibrosis ([place])    per Dr Raul Del  . RA (rheumatoid arthritis) ([place])   . Raynaud's disease   . Redundant colon 01/20/2017  . Scleroderma ([place])   . Telangiectasia of colon   . Tubular adenoma of colon 01/20/2017   unspecified  . Vulvar dysplasia    Past Surgical History:  Procedure Laterality Date  . ABDOMINAL HYSTERECTOMY    . BREAST CYST EXCISION Left 20+ years ago   No scar visible  . BREAST LUMPECTOMY Right   . CARDIAC CATHETERIZATION    . CARPAL TUNNEL RELEASE  Bilateral   . COLON SURGERY     colon polyp   . COLONOSCOPY  09/30/2011   tubular adenoma rtm  . COLONOSCOPY     05/02/2003, 04/10/2000  . COLONOSCOPY WITH PROPOFOL N/A 01/20/2017   Procedure: COLONOSCOPY WITH PROPOFOL;  Surgeon: Lollie Sails, MD;  Location: [facility] ENDOSCOPY;  Service: Endoscopy;  Laterality: N/A;  . COLONOSCOPY WITH PROPOFOL N/A 09/17/2020   Procedure: COLONOSCOPY WITH PROPOFOL;  Surgeon: Lesly Rubenstein, MD;  Location: [facility] ENDOSCOPY;  Service: Endoscopy;  Laterality: N/A;  . CORONARY ATHERECTOMY N/A 11/21/2020   Procedure: CORONARY ATHERECTOMY;  Surgeon: Wellington Hampshire, MD;  Location: [place] CV LAB;  Service: Cardiovascular;  Laterality: N/A;  . CORONARY STENT INTERVENTION  11/21/2020  . CORONARY STENT INTERVENTION N/A 11/21/2020   Procedure: CORONARY STENT INTERVENTION;  Surgeon: Wellington Hampshire, MD;  Location: [place] CV LAB;  Service: Cardiovascular;  Laterality: N/A;  . ESOPHAGOGASTRODUODENOSCOPY    . ESOPHAGOGASTRODUODENOSCOPY     09/30/2011, 04/10/2000 , no repeat rtm  . ESOPHAGOGASTRODUODENOSCOPY N/A 03/02/2018   Procedure: ESOPHAGOGASTRODUODENOSCOPY (EGD);  Surgeon: Virgel Manifold, MD;  Location: [facility] ENDOSCOPY;  Service: Endoscopy;  Laterality: N/A;  . ESOPHAGOGASTRODUODENOSCOPY (EGD) WITH PROPOFOL N/A 01/20/2017   Procedure: ESOPHAGOGASTRODUODENOSCOPY (EGD) WITH PROPOFOL;  Surgeon: Lollie Sails, MD;  Location: [facility] ENDOSCOPY;  Service: Endoscopy;  Laterality: N/A;  . ESOPHAGOGASTRODUODENOSCOPY (EGD) WITH PROPOFOL N/A 09/17/2020   Procedure: ESOPHAGOGASTRODUODENOSCOPY (EGD) WITH PROPOFOL;  Surgeon: Lesly Rubenstein, MD;  Location: [facility] ENDOSCOPY;  Service: Endoscopy;  Laterality: N/A;  . EXCISION HYDRADENITIS LABIA Left 11/19/2016   Procedure: EXCISION LABIAL MASS;  Surgeon: Robert Bellow, MD;  Location: [facility] ORS;  Service: General;  Laterality: Left;  . EYE SURGERY Bilateral    cataracts  . HEEL SPUR EXCISION    . HIP  ARTHROPLASTY Left 02/23/2018   Procedure: ARTHROPLASTY BIPOLAR HIP (HEMIARTHROPLASTY);  Surgeon: Thornton Park, MD;  Location: [facility] ORS;  Service: Orthopedics;  Laterality: Left;  . KYPHOPLASTY N/A 04/26/2019   Procedure: KYPHOPLASTY T7;  Surgeon: Hessie Knows, MD;  Location: [facility] ORS;  Service: Orthopedics;  Laterality: N/A;  . KYPHOPLASTY N/A 05/17/2019   Procedure: KYPHOPLASTY T8;  Surgeon: Hessie Knows, MD;  Location: [facility] ORS;  Service: Orthopedics;  Laterality: N/A;  . KYPHOPLASTY N/A 07/26/2019   Procedure: T-9 KYPHOPLASTY;  Surgeon: Hessie Knows, MD;  Location: [facility] ORS;  Service: Orthopedics;  Laterality: N/A;  . MASS EXCISION Left 11/19/2016   Procedure: EXCISION LEFT THIGH MELANOMA;  Surgeon: Robert Bellow, MD;  Location: [facility] ORS;  Service: General;  Laterality: Left;  . RIGHT/LEFT HEART CATH AND CORONARY ANGIOGRAPHY N/A 11/15/2020   Procedure: RIGHT/LEFT HEART CATH AND CORONARY ANGIOGRAPHY poss intervention;  Surgeon: Minna Merritts, MD;  Location: [place] CV LAB;  Service: Cardiovascular;  Laterality: N/A;  . SENTINEL NODE BIOPSY Left 11/19/2016   Procedure: INGUINAL SENTINEL NODE BIOPSY;  Surgeon: Robert Bellow, MD;  Location: [facility] ORS;  Service: General;  Laterality: Left;  . UPPER ESOPHAGEAL ENDOSCOPIC ULTRASOUND (EUS) N/A 01/29/2017   Procedure: UPPER ESOPHAGEAL ENDOSCOPIC ULTRASOUND (EUS);  Surgeon: Holly Bodily, MD;  Location: [facility] ENDOSCOPY;  Service: Endoscopy;  Laterality: N/A;    Allergies  Allergies  Allergen Reactions  . Remicade [Infliximab] Shortness Of Breath and Itching  . Sulfa Antibiotics Other (See Comments)    Other Reaction: tounge cracked  . Actonel [Risedronate Sodium] Other (See Comments)  . Fosamax [Alendronate Sodium]     GI Bleed   . Procardia [Nifedipine] Hives  . Sulfur Swelling  . Wellbutrin [Bupropion] Anxiety    History of Present Illness    Erin [name] is a 74 y.o. female with a hx of coronary artery disease  s/p atherectomy and DES to LAD in 11/21/2020, scleroderma, Raynaud's disease, hyperlipidemia, hypertension, ILD last seen 11/21/2020 for cardiac catheterization and stent placement.Marland Kitchen  Her history of coronary disease dates back calcification noted on CT 09/2015 with mid to distal LAD stenosis.  She was seen by Dr. Rockey Situ in clinic 10/19/2020 noting worsening shortness of breath.  She had subsequent right/left heart cath 11/15/2020 showing severe single-vessel mid LAD disease 80% LAD lesion and normal LVEDP and LVEF 55-65%.  She underwent subsequent arthrectomy and stenting with Dr. Fletcher Anon 11/21/2020 at [facility].  PCI/DES was placed to mid LAD without complication.  Recommend for DAPT for at least 6 months aggressive risk factor modification.  Metoprolol tartrate 12.5 mg BID was added to her regimen simvastatin transition to atorvastatin by hospitalization.  Presents today for follow up. Reviewed catheterization report and stent placement in detail. She reports continued dyspnea on exertion but she attributes this to her lung disease. Reports no chest pain, pressure, tightness. Tells me her right groin site is healing well and was only sore for two days.   EKGs/Labs/Other Studies Reviewed:   The following  studies were reviewed today:  Coronary stent intervention with orbital arthrectomy 11/21/20:    Mid LAD lesion is 80% stenosed.  Prox RCA lesion is 30% stenosed.  Post intervention, there is a 0% residual stenosis.  A drug-eluting stent was successfully placed using a Oxford [id number].   Successful orbital atherectomy and drug-eluting stent placement to the mid LAD.   Recommendations: Dual antiplatelet therapy for at least 6 months. Aggressive treatment of risk factors. We will observe overnight and likely discharge home tomorrow morning.   Diagnostic Dominance: Right      Intervention            EKG:  EKG is ordered today.  The ekg ordered today demonstrates NSR 66  bpm with left axis deviation stable prior anterolateral infarct.   Recent Labs: 11/22/2020: BUN 18; Creatinine, Ser 0.86; Hemoglobin 10.7; Platelets 340; Potassium 4.4; Sodium 136  Recent Lipid Panel No results found for: CHOL, TRIG, HDL, CHOLHDL, VLDL, LDLCALC, LDLDIRECT   Home Medications   Current Meds  Medication Sig  . albuterol (VENTOLIN HFA) 108 (90 Base) MCG/ACT inhaler Inhale 2 puffs into the lungs every 4 (four) hours as needed for wheezing or shortness of breath.  . ALPRAZolam (XANAX) 0.5 MG tablet Take 1 tablet (0.5 mg total) by mouth 4 (four) times daily as needed for anxiety.  Marland Kitchen amLODipine (NORVASC) 5 MG tablet Take 5 mg by mouth daily.   Marland Kitchen aspirin 81 MG tablet Take 81 mg by mouth daily.   Marland Kitchen dexlansoprazole (DEXILANT) 60 MG capsule Take 1 capsule (60 mg total) by mouth daily.  . DULoxetine (CYMBALTA) 60 MG capsule Take 60 mg by mouth daily. In the morning.  . ezetimibe (ZETIA) 10 MG tablet Take 1 tablet (10 mg total) by mouth daily.  . furosemide (LASIX) 20 MG tablet Take 20 mg by mouth daily.   Marland Kitchen gabapentin (NEURONTIN) 600 MG tablet Take 600 mg by mouth 3 (three) times daily.   . hydroxychloroquine (PLAQUENIL) 200 MG tablet Take 200 mg by mouth 2 (two) times daily.   Marland Kitchen latanoprost (XALATAN) 0.005 % ophthalmic solution Place 1 drop into both eyes at bedtime.   . Multiple Vitamin (MULTIVITAMIN WITH MINERALS) TABS tablet Take 1 tablet by mouth daily.  . Nintedanib (OFEV) 150 MG CAPS Take 1 capsule (150 mg total) by mouth daily.  . nitroGLYCERIN (NITROSTAT) 0.4 MG SL tablet Place 1 tablet (0.4 mg total) under the tongue every 5 (five) minutes as needed for chest pain.  . polyethylene glycol (MIRALAX / GLYCOLAX) packet Take 17 g by mouth daily as needed (constipation.).   Marland Kitchen predniSONE (DELTASONE) 5 MG tablet Take 1 tablet (5 mg total) by mouth 2 (two) times daily with a meal. RESUME AFTER COMPLETING THE PREDNISONE TAPER ORDERED SEPARATELY  . zolpidem (AMBIEN) 10 MG tablet Take 1  tablet (10 mg total) by mouth at bedtime as needed for sleep.  . [DISCONTINUED] atorvastatin (LIPITOR) 40 MG tablet Take 1 tablet (40 mg total) by mouth daily.  . [DISCONTINUED] clopidogrel (PLAVIX) 75 MG tablet Take 1 tablet (75 mg total) by mouth daily.  . [DISCONTINUED] metoprolol tartrate (LOPRESSOR) 25 MG tablet Take 0.5 tablets (12.5 mg total) by mouth 2 (two) times daily.   Current Facility-Administered Medications for the 11/27/20 encounter (Office Visit) with Loel Dubonnet, NP  Medication  . nitroGLYCERIN (NITROSTAT) SL tablet 0.4 mg     Review of Systems     All other systems reviewed and are otherwise negative except as noted  above.  Physical Exam    VS:  BP 110/68 (BP Location: Right Arm, Patient Position: Sitting, Cuff Size: Normal)   Pulse 66   Ht 4' 11.5" (1.511 m)   Wt 155 lb (70.3 kg)   LMP  (LMP Unknown)   SpO2 93%   BMI 30.78 kg/m  , BMI Body mass index is 30.78 kg/m.  Wt Readings from Last 3 Encounters:  11/27/20 155 lb (70.3 kg)  11/22/20 162 lb 0.6 oz (73.5 kg)  11/15/20 160 lb (72.6 kg)    GEN: Well nourished, well developed, in no acute distress. HEENT: normal. Neck: Supple, no JVD, carotid bruits, or masses. Cardiac: RRR, no murmurs, rubs, or gallops. No clubbing, cyanosis, edema.  Radials/DP/PT 2+ and equal bilaterally.  Respiratory:  Respirations regular and unlabored, clear to auscultation bilaterally. GI: Soft, nontender, nondistended. MS: No deformity or atrophy. Skin: Warm and dry, no rash. R groin cath site clean, dry, intact with no signs of infection and no hematoma. Neuro:  Strength and sensation are intact. Psych: Normal affect.  Assessment & Plan    1. [name] - s/p recent atherectomy and DES to mLAD. EKG today with no acute St/T wave changes. Reports no recurrent chest pain, pressure, tightness. DOE is stable at her baseline. R groin healing well. Recommended for DAPT with Plavix and Aspirin for at least 6 months - denies bleeding  complications. GDMT includes aspirin, plavix, Metoprolol, Atorvastatin, PRN nitroglycerin. Refills provided today. Encouraged participation in cardiac rehab and she prefers to participate in the spring. Long discussion regarding heart healthy diet.   2. HLD, LDL goal less than 70-  09/25/20 LDL 66. Transitioned from Simvasatin to Atorvastatin after recent stent. Reports no myalgias. Plan for repeat lipid/CMP at next appointment.   Disposition: Follow up in 2 month(s) with Dr. Rockey Situ or APP.  Signed, Loel Dubonnet, NP 11/27/2020, 5:03 PM [place]

## 2020-11-27 NOTE — Patient Instructions (Addendum)
Medication Instructions:  No medication changes today.   *If you need a refill on your cardiac medications before your next appointment, please call your pharmacy*  Lab Work: No lab work today.   Testing/Procedures: Your EKG today was stable compared to previous.   Follow-Up: At [facility], you and your health needs are our priority.  As part of our continuing mission to provide you with exceptional heart care, we have created designated Provider Care Teams.  These Care Teams include your primary Cardiologist (physician) and Advanced Practice Providers (APPs -  Physician Assistants and Nurse Practitioners) who all work together to provide you with the care you need, when you need it.  We recommend signing up for the patient portal called "MyChart".  Sign up information is provided on this After Visit Summary.  MyChart is used to connect with patients for Virtual Visits (Telemedicine).  Patients are able to view lab/test results, encounter notes, upcoming appointments, etc.  Non-urgent messages can be sent to your provider as well.   To learn more about what you can do with MyChart, go to [email or web address].    Your next appointment:   2 month(s)  The format for your next appointment:   In Person  Provider:   You may see Ida Rogue, MD or one of the following Advanced Practice Providers on your designated Care Team:    Murray Hodgkins, NP  Christell Faith, PA-C  Marrianne Mood, PA-C  Cadence [place], [place]  Laurann Montana, NP  Other Instructions  For anyone with a history of heart disease, we recommend the A, B, C, D, E's..  A = Aspirin  B = Beta blocker to help your heart relax - this is your Metoprolol  C = Cholesterol medicine (this is your Atorvastatin)  D = Don't forget nitroglycerin  E = Extras! This is the Plavix for at least 6 months to protect the stent   You have been referred to cardiac rehab. This is a combination program including monitored  exercise, dietary education, and support group. When you are ready to participate, the phone number for cardiac rehab at [facility] is [phone].    Low-Sodium Eating Plan Sodium, which is an element that makes up salt, helps you maintain a healthy balance of fluids in your body. Too much sodium can increase your blood pressure and cause fluid and waste to be held in your body. Your health care provider or dietitian may recommend following this plan if you have high blood pressure (hypertension), kidney disease, liver disease, or heart failure. Eating less sodium can help lower your blood pressure, reduce swelling, and protect your heart, liver, and kidneys. What are tips for following this plan? General guidelines  Most people on this plan should limit their sodium intake to 1,500-2,000 mg (milligrams) of sodium each day. Reading food labels   The Nutrition Facts label lists the amount of sodium in one serving of the food. If you eat more than one serving, you must multiply the listed amount of sodium by the number of servings.  Choose foods with less than 140 mg of sodium per serving.  Avoid foods with 300 mg of sodium or more per serving. Shopping  Look for lower-sodium products, often labeled as "low-sodium" or "no salt added."  Always check the sodium content even if foods are labeled as "unsalted" or "no salt added".  Buy fresh foods. ? Avoid canned foods and premade or frozen meals. ? Avoid canned, cured, or processed meats  Buy breads that  have less than 80 mg of sodium per slice. Cooking  Eat more home-cooked food and less restaurant, buffet, and fast food.  Avoid adding salt when cooking. Use salt-free seasonings or herbs instead of table salt or sea salt. Check with your health care provider or pharmacist before using salt substitutes.  Cook with plant-based oils, such as canola, sunflower, or olive oil. Meal planning  When eating at a restaurant, ask that your food be  prepared with less salt or no salt, if possible.  Avoid foods that contain MSG (monosodium glutamate). MSG is sometimes added to [place] food, bouillon, and some canned foods. What foods are recommended? The items listed may not be a complete list. Talk with your dietitian about what dietary choices are best for you. Grains Low-sodium cereals, including oats, puffed wheat and rice, and shredded wheat. Low-sodium crackers. Unsalted rice. Unsalted pasta. Low-sodium bread. Whole-grain breads and whole-grain pasta. Vegetables Fresh or frozen vegetables. "No salt added" canned vegetables. "No salt added" tomato sauce and paste. Low-sodium or reduced-sodium tomato and vegetable juice. Fruits Fresh, frozen, or canned fruit. Fruit juice. Meats and other protein foods Fresh or frozen (no salt added) meat, poultry, seafood, and fish. Low-sodium canned tuna and salmon. Unsalted nuts. Dried peas, beans, and lentils without added salt. Unsalted canned beans. Eggs. Unsalted nut butters. Dairy Milk. Soy milk. Cheese that is naturally low in sodium, such as ricotta cheese, fresh mozzarella, or Swiss cheese Low-sodium or reduced-sodium cheese. Cream cheese. Yogurt. Fats and oils Unsalted butter. Unsalted margarine with no trans fat. Vegetable oils such as canola or olive oils. Seasonings and other foods Fresh and dried herbs and spices. Salt-free seasonings. Low-sodium mustard and ketchup. Sodium-free salad dressing. Sodium-free light mayonnaise. Fresh or refrigerated horseradish. Lemon juice. Vinegar. Homemade, reduced-sodium, or low-sodium soups. Unsalted popcorn and pretzels. Low-salt or salt-free chips. What foods are not recommended? The items listed may not be a complete list. Talk with your dietitian about what dietary choices are best for you. Grains Instant hot cereals. Bread stuffing, pancake, and biscuit mixes. Croutons. Seasoned rice or pasta mixes. Noodle soup cups. Boxed or frozen macaroni and  cheese. Regular salted crackers. Self-rising flour. Vegetables Sauerkraut, pickled vegetables, and relishes. Olives. [place] fries. Onion rings. Regular canned vegetables (not low-sodium or reduced-sodium). Regular canned tomato sauce and paste (not low-sodium or reduced-sodium). Regular tomato and vegetable juice (not low-sodium or reduced-sodium). Frozen vegetables in sauces. Meats and other protein foods Meat or fish that is salted, canned, smoked, spiced, or pickled. Bacon, ham, sausage, hotdogs, corned beef, chipped beef, packaged lunch meats, salt pork, jerky, pickled herring, anchovies, regular canned tuna, sardines, salted nuts. Dairy Processed cheese and cheese spreads. Cheese curds. Blue cheese. Feta cheese. String cheese. Regular cottage cheese. Buttermilk. Canned milk. Fats and oils Salted butter. Regular margarine. Ghee. Bacon fat. Seasonings and other foods Onion salt, garlic salt, seasoned salt, table salt, and sea salt. Canned and packaged gravies. Worcestershire sauce. Tartar sauce. Barbecue sauce. Teriyaki sauce. Soy sauce, including reduced-sodium. Steak sauce. Fish sauce. Oyster sauce. Cocktail sauce. Horseradish that you find on the shelf. Regular ketchup and mustard. Meat flavorings and tenderizers. Bouillon cubes. Hot sauce and Tabasco sauce. Premade or packaged marinades. Premade or packaged taco seasonings. Relishes. Regular salad dressings. Salsa. Potato and tortilla chips. Corn chips and puffs. Salted popcorn and pretzels. Canned or dried soups. Pizza. Frozen entrees and pot pies. Summary  Eating less sodium can help lower your blood pressure, reduce swelling, and protect your heart, liver, and kidneys.  Most people on this plan should limit their sodium intake to 1,500-2,000 mg (milligrams) of sodium each day.  Canned, boxed, and frozen foods are high in sodium. Restaurant foods, fast foods, and pizza are also very high in sodium. You also get sodium by adding salt to  food.  Try to cook at home, eat more fresh fruits and vegetables, and eat less fast food, canned, processed, or prepared foods. This information is not intended to replace advice given to you by your health care provider. Make sure you discuss any questions you have with your health care provider. Document Revised: 11/06/2017 Document Reviewed: 11/17/2016 Elsevier Patient Education  2020 [employer].

## 2020-11-28 ENCOUNTER — Telehealth: Payer: Self-pay

## 2020-11-28 NOTE — Telephone Encounter (Signed)
-----   Message from Minna Merritts, MD sent at 11/28/2020  9:19 AM EST ----- Labs CBC and bmp within normal range

## 2020-11-28 NOTE — Telephone Encounter (Signed)
Able to reach pt regarding her recent lab work, Dr. Rockey Situ had a chance to review her results and advised   "CBC and bmp within normal range"  Pt is pleased with results, no questions or concerns at this time, will call back for anything further.

## 2020-12-09 NOTE — Interval H&P Note (Signed)
History and Physical Interval Note:  12/09/2020 4:46 PM  Erin [name]  has presented today for surgery, with the diagnosis of [name].  The various methods of treatment have been discussed with the patient and family. After consideration of risks, benefits and other options for treatment, the patient has consented to  Procedure(s): CORONARY STENT INTERVENTION (N/A) CORONARY ATHERECTOMY (N/A) as a surgical intervention.  The patient's history has been reviewed, patient examined, no change in status, stable for surgery.  I have reviewed the patient's chart and labs.  Questions were answered to the patient's satisfaction.     Julien Nordmann

## 2020-12-09 NOTE — H&P (Signed)
H&P Addendum, precardiac catheterization  Patient was seen and evaluated prior to Cardiac catheterization procedure Symptoms, prior testing details again confirmed with the patient Patient examined, no significant change from prior exam Lab work reviewed in detail personally by myself Patient understands risk and benefit of the procedure, willing to proceed  Signed, Tim Vivianne Carles, MD, Ph.D [facility]    

## 2021-01-15 ENCOUNTER — Telehealth: Payer: Self-pay | Admitting: Cardiovascular Disease

## 2021-01-15 NOTE — Telephone Encounter (Signed)
Pt c/o of Chest Pain: STAT if CP now or developed within 24 hours  1. Are you having CP right now? No - this morning and Saturday   2. Are you experiencing any other symptoms (ex. SOB, nausea, vomiting, sweating)? No - always is SOB  3. How long have you been experiencing CP? The past few days  4. Is your CP continuous or coming and going? Coming and going   5. Have you taken Nitroglycerin? Yes, Saturday and this morning

## 2021-01-15 NOTE — Telephone Encounter (Signed)
Was able to reach pt by phone, pt reports intermittent chest discomfort up her upper chest under her chin. Pt reports it is not painful or tight, "just discomfort". First episode was Sat 2/5 and it went away. Did take Nitro, but did not see a difference per pt. Same chest discomfort again this morning, took another Nitro and reports "slight difference". Pt did not check her BP or HR during those times, pt has chronic shob, reports no difference or increase shob, denies dizziness or lightheadedness, no difficult with swallowing, as discomfort was under her chin. Pt was able to take her BP while on the phone 153/85 HR 76. Although, pt was not relax during this time and was also having trouble getting her machine. Advised to recheck her BP in about 30 mins while relax and at rest. Pt verbalized understanding. Pt reports has taken her 12.5 mg Lopressor this am and her 5 mg Norvasc. Advised to take her 2nd dose of 12.5 mg of Lopressor this afternoon. Pt has an appt with Dr. Rockey Situ in March, was able to reschedule her for this Friday 2/11 at 10:30am with Laurann Montana, NP. Advised pt if her chest discomfort changes, gets worse, followed by pain, increase shob, dizziness, pain radiates to other parts of the body then seek medical attention promptly. Pt verbalized understanding. Otherwise all questions or concerns were address and no additional concerns at this time. Agreeable to plan, will call back for anything further.

## 2021-01-18 ENCOUNTER — Ambulatory Visit (INDEPENDENT_AMBULATORY_CARE_PROVIDER_SITE_OTHER): Payer: Medicare Other | Admitting: Family

## 2021-01-18 ENCOUNTER — Other Ambulatory Visit: Payer: Self-pay

## 2021-01-18 ENCOUNTER — Encounter: Payer: Self-pay | Admitting: Family

## 2021-01-18 VITALS — BP 140/70 | HR 64 | Ht 59.5 in | Wt 155.0 lb

## 2021-01-18 DIAGNOSIS — I25118 Atherosclerotic heart disease of native coronary artery with other forms of angina pectoris: Secondary | ICD-10-CM

## 2021-01-18 DIAGNOSIS — J8410 Pulmonary fibrosis, unspecified: Secondary | ICD-10-CM

## 2021-01-18 DIAGNOSIS — E785 Hyperlipidemia, unspecified: Secondary | ICD-10-CM

## 2021-01-18 MED ORDER — ISOSORBIDE MONONITRATE ER 30 MG PO TB24: 15.0000 mg | ORAL_TABLET | Freq: Every day | ORAL | 5 refills | Status: DC

## 2021-01-18 NOTE — Progress Notes (Signed)
Office Visit    Patient Name: Erin [name] Date of Encounter: 01/18/2021  Primary Care Provider:  Idelle Crouch, MD Primary Cardiologist:  Ida Rogue, MD Electrophysiologist:  None   Chief Complaint    Erin [name] is a 75 y.o. female with a hx of coronary artery disease s/p atherectomy and DES to LAD in 11/21/2020, scleroderma, Raynaud's disease, hyperlipidemia, hypertension, ILD presents today for follow-up of coronary artery disease  Past Medical History    Past Medical History:  Diagnosis Date  . Anemia    h/o  . Cancer ([place])    melanoma x 3the last one was of her leg and surgeon removed it  . Colon polyp   . Complication of anesthesia    PT STATED THAT WITH JOINT SURGERY THAT SHE WAS GIVEN SOME TYPE OF ANESTHESIA THAT MADE HER HALLUCINATE   . Coronary artery calcification   . Coronary artery disease   . DDD (degenerative disc disease), lumbar   . Diverticulosis 01/20/2017  . Dysphagia   . Dyspnea    with exertion-unable to walk a mile without getting sob- dr sparks set pt up to see Dr Rockey Situ after 11-19-16 surgery  . Environmental allergies   . Esophageal dysmotility   . Fibrocystic breast disease   . GERD (gastroesophageal reflux disease)   . Heart murmur    asymptomatic  . Hyperlipidemia    unspecified  . Hypertension   . Mitral regurgitation   . Monilial esophagitis ([place]) 01/20/2017  . Obstipation   . Osteopenia   . Peptic ulcer disease   . Pulmonary fibrosis ([place])    per Dr Raul Del  . RA (rheumatoid arthritis) ([place])   . Raynaud's disease   . Redundant colon 01/20/2017  . Scleroderma ([place])   . Telangiectasia of colon   . Tubular adenoma of colon 01/20/2017   unspecified  . Vulvar dysplasia    Past Surgical History:  Procedure Laterality Date  . ABDOMINAL HYSTERECTOMY    . BREAST CYST EXCISION Left 20+ years ago   No scar visible  . BREAST LUMPECTOMY Right   . CARDIAC CATHETERIZATION    . CARPAL TUNNEL RELEASE Bilateral   . COLON  SURGERY     colon polyp   . COLONOSCOPY  09/30/2011   tubular adenoma rtm  . COLONOSCOPY     05/02/2003, 04/10/2000  . COLONOSCOPY WITH PROPOFOL N/A 01/20/2017   Procedure: COLONOSCOPY WITH PROPOFOL;  Surgeon: Lollie Sails, MD;  Location: [facility] ENDOSCOPY;  Service: Endoscopy;  Laterality: N/A;  . COLONOSCOPY WITH PROPOFOL N/A 09/17/2020   Procedure: COLONOSCOPY WITH PROPOFOL;  Surgeon: Lesly Rubenstein, MD;  Location: [facility] ENDOSCOPY;  Service: Endoscopy;  Laterality: N/A;  . CORONARY ATHERECTOMY N/A 11/21/2020   Procedure: CORONARY ATHERECTOMY;  Surgeon: Wellington Hampshire, MD;  Location: [place] CV LAB;  Service: Cardiovascular;  Laterality: N/A;  . CORONARY STENT INTERVENTION  11/21/2020  . CORONARY STENT INTERVENTION N/A 11/21/2020   Procedure: CORONARY STENT INTERVENTION;  Surgeon: Wellington Hampshire, MD;  Location: [place] CV LAB;  Service: Cardiovascular;  Laterality: N/A;  . ESOPHAGOGASTRODUODENOSCOPY    . ESOPHAGOGASTRODUODENOSCOPY     09/30/2011, 04/10/2000 , no repeat rtm  . ESOPHAGOGASTRODUODENOSCOPY N/A 03/02/2018   Procedure: ESOPHAGOGASTRODUODENOSCOPY (EGD);  Surgeon: Virgel Manifold, MD;  Location: [facility] ENDOSCOPY;  Service: Endoscopy;  Laterality: N/A;  . ESOPHAGOGASTRODUODENOSCOPY (EGD) WITH PROPOFOL N/A 01/20/2017   Procedure: ESOPHAGOGASTRODUODENOSCOPY (EGD) WITH PROPOFOL;  Surgeon: Lollie Sails, MD;  Location: [facility] ENDOSCOPY;  Service:  Endoscopy;  Laterality: N/A;  . ESOPHAGOGASTRODUODENOSCOPY (EGD) WITH PROPOFOL N/A 09/17/2020   Procedure: ESOPHAGOGASTRODUODENOSCOPY (EGD) WITH PROPOFOL;  Surgeon: Lesly Rubenstein, MD;  Location: [facility] ENDOSCOPY;  Service: Endoscopy;  Laterality: N/A;  . EXCISION HYDRADENITIS LABIA Left 11/19/2016   Procedure: EXCISION LABIAL MASS;  Surgeon: Robert Bellow, MD;  Location: [facility] ORS;  Service: General;  Laterality: Left;  . EYE SURGERY Bilateral    cataracts  . HEEL SPUR EXCISION    . HIP ARTHROPLASTY Left 02/23/2018    Procedure: ARTHROPLASTY BIPOLAR HIP (HEMIARTHROPLASTY);  Surgeon: Thornton Park, MD;  Location: [facility] ORS;  Service: Orthopedics;  Laterality: Left;  . KYPHOPLASTY N/A 04/26/2019   Procedure: KYPHOPLASTY T7;  Surgeon: Hessie Knows, MD;  Location: [facility] ORS;  Service: Orthopedics;  Laterality: N/A;  . KYPHOPLASTY N/A 05/17/2019   Procedure: KYPHOPLASTY T8;  Surgeon: Hessie Knows, MD;  Location: [facility] ORS;  Service: Orthopedics;  Laterality: N/A;  . KYPHOPLASTY N/A 07/26/2019   Procedure: T-9 KYPHOPLASTY;  Surgeon: Hessie Knows, MD;  Location: [facility] ORS;  Service: Orthopedics;  Laterality: N/A;  . MASS EXCISION Left 11/19/2016   Procedure: EXCISION LEFT THIGH MELANOMA;  Surgeon: Robert Bellow, MD;  Location: [facility] ORS;  Service: General;  Laterality: Left;  . RIGHT/LEFT HEART CATH AND CORONARY ANGIOGRAPHY N/A 11/15/2020   Procedure: RIGHT/LEFT HEART CATH AND CORONARY ANGIOGRAPHY poss intervention;  Surgeon: Minna Merritts, MD;  Location: [place] CV LAB;  Service: Cardiovascular;  Laterality: N/A;  . SENTINEL NODE BIOPSY Left 11/19/2016   Procedure: INGUINAL SENTINEL NODE BIOPSY;  Surgeon: Robert Bellow, MD;  Location: [facility] ORS;  Service: General;  Laterality: Left;  . UPPER ESOPHAGEAL ENDOSCOPIC ULTRASOUND (EUS) N/A 01/29/2017   Procedure: UPPER ESOPHAGEAL ENDOSCOPIC ULTRASOUND (EUS);  Surgeon: Holly Bodily, MD;  Location: [facility] ENDOSCOPY;  Service: Endoscopy;  Laterality: N/A;    Allergies  Allergies  Allergen Reactions  . Remicade [Infliximab] Shortness Of Breath and Itching  . Sulfa Antibiotics Other (See Comments)    Other Reaction: tounge cracked  . Actonel [Risedronate Sodium] Other (See Comments)  . Fosamax [Alendronate Sodium]     GI Bleed   . Procardia [Nifedipine] Hives  . Elemental Sulfur Swelling  . Wellbutrin [Bupropion] Anxiety    History of Present Illness    Erin [name] is a 75 y.o. female with a hx of coronary artery disease s/p atherectomy  and DES to LAD in 11/21/2020, scleroderma, Raynaud's disease, hyperlipidemia, hypertension, ILD last seen 11/27/20.  Her history of coronary disease dates back calcification noted on CT 09/2015 with mid to distal LAD stenosis.  She was seen by Dr. Rockey Situ in clinic 10/19/2020 noting worsening shortness of breath.  She had subsequent right/left heart cath 11/15/2020 showing severe single-vessel mid LAD disease 80% LAD lesion and normal LVEDP and LVEF 55-65%.  She underwent subsequent arthrectomy and stenting with Dr. Fletcher Anon 11/21/2020 at [facility].  PCI/DES was placed to mid LAD without complication.  Recommend for DAPT for at least 6 months aggressive risk factor modification.  Metoprolol tartrate 12.5 mg BID was added to her regimen simvastatin transition to atorvastatin by hospitalization.\  She was seen in follow up 11/27/20 and doing well. Her dyspnea on exertion was stable at baseline which she attributed to her lung disease. She reported no chest pain.   Presents today for follow up. Shares with me that on Saturday, Tuesday, and Wednesday she had episodes of intermittent chest discomfort but not pain. It occurred at rest and was in her  left chest and radiated to her epigastric region. She took two nitroglycerin on two occasions with partial relief. Tells me occurs normally when she is sitting in her rocking chair. No noted temporal factors. Not sure if it is associated with eating. Tells me these are different symptoms than she has had previously. Wonders if it might be indigestion but her previous symptoms of GERD were bloating and she has been taking her Dexilant as prescribed.   EKGs/Labs/Other Studies Reviewed:   The following studies were reviewed today:  Coronary stent intervention with orbital arthrectomy 11/21/20:    Mid LAD lesion is 80% stenosed.  Prox RCA lesion is 30% stenosed.  Post intervention, there is a 0% residual stenosis.  A drug-eluting stent was successfully placed  using a [place] [id number].   Successful orbital atherectomy and drug-eluting stent placement to the mid LAD.   Recommendations: Dual antiplatelet therapy for at least 6 months. Aggressive treatment of risk factors. We will observe overnight and likely discharge home tomorrow morning.   Diagnostic Dominance: Right      Intervention            EKG:  EKG is ordered today.  The ekg ordered today demonstrates NSR 64 bpm with  LAFB. No acute ST/T wave changes.   Recent Labs: 11/22/2020: BUN 18; Creatinine, Ser 0.86; Hemoglobin 10.7; Platelets 340; Potassium 4.4; Sodium 136  Recent Lipid Panel No results found for: CHOL, TRIG, HDL, CHOLHDL, VLDL, LDLCALC, LDLDIRECT   Home Medications   Current Meds  Medication Sig  . albuterol (VENTOLIN HFA) 108 (90 Base) MCG/ACT inhaler Inhale 2 puffs into the lungs every 4 (four) hours as needed for wheezing or shortness of breath.  . ALPRAZolam (XANAX) 0.5 MG tablet Take 1 tablet (0.5 mg total) by mouth 4 (four) times daily as needed for anxiety.  Marland Kitchen amLODipine (NORVASC) 5 MG tablet Take 5 mg by mouth daily.   Marland Kitchen aspirin 81 MG tablet Take 81 mg by mouth daily.   Marland Kitchen atorvastatin (LIPITOR) 40 MG tablet Take 1 tablet (40 mg total) by mouth daily.  . clopidogrel (PLAVIX) 75 MG tablet Take 1 tablet (75 mg total) by mouth daily.  Marland Kitchen dexlansoprazole (DEXILANT) 60 MG capsule Take 1 capsule (60 mg total) by mouth daily.  . DULoxetine (CYMBALTA) 60 MG capsule Take 60 mg by mouth daily. In the morning.  . ezetimibe (ZETIA) 10 MG tablet Take 1 tablet (10 mg total) by mouth daily.  . furosemide (LASIX) 20 MG tablet Take 20 mg by mouth daily.   Marland Kitchen gabapentin (NEURONTIN) 600 MG tablet Take 600 mg by mouth 3 (three) times daily.   . hydroxychloroquine (PLAQUENIL) 200 MG tablet Take 200 mg by mouth 2 (two) times daily.   . isosorbide mononitrate (IMDUR) 30 MG 24 hr tablet Take 0.5 tablets (15 mg total) by mouth daily.  Marland Kitchen latanoprost (XALATAN) 0.005 %  ophthalmic solution Place 1 drop into both eyes at bedtime.   . metoprolol tartrate (LOPRESSOR) 25 MG tablet Take 0.5 tablets (12.5 mg total) by mouth 2 (two) times daily.  . Multiple Vitamin (MULTIVITAMIN WITH MINERALS) TABS tablet Take 1 tablet by mouth daily.  . Nintedanib (OFEV) 150 MG CAPS Take 1 capsule (150 mg total) by mouth daily.  . nitroGLYCERIN (NITROSTAT) 0.4 MG SL tablet Place 1 tablet (0.4 mg total) under the tongue every 5 (five) minutes as needed for chest pain.  . Pirfenidone (ESBRIET) 267 MG TABS Take by mouth.  . polyethylene glycol (MIRALAX /  GLYCOLAX) packet Take 17 g by mouth daily as needed (constipation.).   Marland Kitchen predniSONE (DELTASONE) 5 MG tablet Take 1 tablet (5 mg total) by mouth 2 (two) times daily with a meal. RESUME AFTER COMPLETING THE PREDNISONE TAPER ORDERED SEPARATELY  . zolpidem (AMBIEN) 10 MG tablet Take 1 tablet (10 mg total) by mouth at bedtime as needed for sleep.   Current Facility-Administered Medications for the 01/18/21 encounter (Office Visit) with Loel Dubonnet, NP  Medication  . nitroGLYCERIN (NITROSTAT) SL tablet 0.4 mg    Review of Systems     All other systems reviewed and are otherwise negative except as noted above.  Physical Exam    VS:  BP 140/70 (BP Location: Left Arm, Patient Position: Sitting, Cuff Size: Normal)   Pulse 64   Ht 4' 11.5" (1.511 m)   Wt 155 lb (70.3 kg)   LMP  (LMP Unknown)   BMI 30.78 kg/m  , BMI Body mass index is 30.78 kg/m.  Wt Readings from Last 3 Encounters:  01/18/21 155 lb (70.3 kg)  11/27/20 155 lb (70.3 kg)  11/22/20 162 lb 0.6 oz (73.5 kg)    GEN: Well nourished, well developed, in no acute distress. HEENT: normal. Neck: Supple, no JVD, carotid bruits, or masses. Cardiac: RRR, no murmurs, rubs, or gallops. No clubbing, cyanosis, edema.  Radials/DP/PT 2+ and equal bilaterally.  Respiratory:  Respirations regular and unlabored, clear to auscultation bilaterally. GI: Soft, nontender,  nondistended. MS: No deformity or atrophy. Skin: Warm and dry, no rash. R groin cath site clean, dry, intact with no signs of infection and no hematoma. Neuro:  Strength and sensation are intact. Psych: Normal affect.  Assessment & Plan    1. [name] - s/p atherectomy and DES to mLAD 11/21/20.  Recommended for DAPT with Plavix and Aspirin for at least 6 months - denies bleeding complications. GDMT includes aspirin, plavix, Metoprolol, Atorvastatin, PRN nitroglycerin. 3 episode of chest discomfort overall atypical for angina as they occurred at rest and radiated to epigastric region. Consider possible etiology GERD vs musculoskeletal. As there was some relief with nitroglycerin, will Rx Imdur 15mg  daily for antianginal benefit. Denies missed doses of Plavix/Aspirin. EKG today without acute ST/T wave changes. She will contact our office with recurrent chestt pain.   2. HLD, LDL goal less than 70- Direct LDL, CMP today for monitoring. Continue Atorvastatin.   3. 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 Follows with rheumatology as well as pulmonology.   Disposition: Follow up in 2 month(s) with Dr. Rockey Situ or APP.  Signed, Loel Dubonnet, NP 01/18/2021, 4:47 PM [place]

## 2021-01-18 NOTE — Patient Instructions (Addendum)
Medication Instructions:  Your physician has recommended you make the following change in your medication:   START Imdur 15mg  (half tablet) daily  *If you need a refill on your cardiac medications before your next appointment, please call your pharmacy*   Lab Work: Your provider recommends that you return for lab work today: CMP, direct LDL  If you have labs (blood work) drawn today and your tests are completely normal, you will receive your results only by: Marland Kitchen MyChart Message (if you have MyChart) OR . A paper copy in the mail If you have any lab test that is abnormal or we need to change your treatment, we will call you to review the results.  Testing/Procedures: Your EKG today shows normal sinus rhythm. This is a good result!  Follow-Up: At [facility], you and your health needs are our priority.  As part of our continuing mission to provide you with exceptional heart care, we have created designated Provider Care Teams.  These Care Teams include your primary Cardiologist (physician) and Advanced Practice Providers (APPs -  Physician Assistants and Nurse Practitioners) who all work together to provide you with the care you need, when you need it.  We recommend signing up for the patient portal called "MyChart".  Sign up information is provided on this After Visit Summary.  MyChart is used to connect with patients for Virtual Visits (Telemedicine).  Patients are able to view lab/test results, encounter notes, upcoming appointments, etc.  Non-urgent messages can be sent to your provider as well.   To learn more about what you can do with MyChart, go to [email or web address].    Your next appointment:   2 month(s)  The format for your next appointment:   In Person  Provider:   You may see Ida Rogue, MD or one of the following Advanced Practice Providers on your designated Care Team:    Murray Hodgkins, NP  Christell Faith, PA-C  Marrianne Mood, PA-C  Cadence [place],  [place]  Laurann Montana, NP

## 2021-01-19 LAB — COMPREHENSIVE METABOLIC PANEL
ALT: 19 IU/L (ref 0–32)
AST: 24 IU/L (ref 0–40)
Albumin/Globulin Ratio: 1.5 (ref 1.2–2.2)
Albumin: 3.5 g/dL — ABNORMAL LOW (ref 3.7–4.7)
Alkaline Phosphatase: 88 IU/L (ref 44–121)
BUN/Creatinine Ratio: 16 (ref 12–28)
BUN: 11 mg/dL (ref 8–27)
Bilirubin Total: 0.3 mg/dL (ref 0.0–1.2)
CO2: 19 mmol/L — ABNORMAL LOW (ref 20–29)
Calcium: 8.7 mg/dL (ref 8.7–10.3)
Chloride: 95 mmol/L — ABNORMAL LOW (ref 96–106)
Creatinine, Ser: 0.67 mg/dL (ref 0.57–1.00)
GFR calc Af Amer: 99 mL/min/{1.73_m2} (ref >59)
GFR calc non Af Amer: 86 mL/min/{1.73_m2} (ref >59)
Globulin, Total: 2.4 g/dL (ref 1.5–4.5)
Glucose: 82 mg/dL (ref 65–99)
Potassium: 4.6 mmol/L (ref 3.5–5.2)
Sodium: 129 mmol/L — ABNORMAL LOW (ref 134–144)
Total Protein: 5.9 g/dL — ABNORMAL LOW (ref 6.0–8.5)

## 2021-01-19 LAB — LDL CHOLESTEROL, DIRECT: LDL Direct: 43 mg/dL (ref 0–99)

## 2021-01-22 ENCOUNTER — Telehealth: Payer: Self-pay | Admitting: *Deleted

## 2021-01-22 DIAGNOSIS — I5031 Acute diastolic (congestive) heart failure: Secondary | ICD-10-CM

## 2021-01-22 NOTE — Telephone Encounter (Signed)
-----   Message from Loel Dubonnet, NP sent at 01/21/2021  9:50 AM EST ----- LDL at goal of <70. Great result! Normal liver and kidney function. Sodium mildly low, recommend restriction to less than 2 L of fluid per day and repeat BMP in this week for monitoring. Protein and albumin remain low though mildly improved compared to previous. Recommend increased protein intake.

## 2021-01-22 NOTE — Telephone Encounter (Signed)
Attempted to call pt to review lab results.  °No answer. Lmtcb.  °

## 2021-01-22 NOTE — Telephone Encounter (Signed)
Spoke to pt. Notified of results and provider's recc.  Pt verbalized understanding. She will: 1) Keep fluid intake <2L per day 2) Increase protein intake 3) [facility] either 2/17 or 2/18  Lab orders placed. Pt has no questions at this time.

## 2021-01-25 ENCOUNTER — Other Ambulatory Visit: Payer: Self-pay

## 2021-01-25 ENCOUNTER — Telehealth: Payer: Self-pay | Admitting: *Deleted

## 2021-01-25 ENCOUNTER — Other Ambulatory Visit
Admission: RE | Admit: 2021-01-25 | Discharge: 2021-01-25 | Disposition: A | Discharge status: Home / Self Care | Payer: Medicare Other | Attending: Family | Admitting: Family

## 2021-01-25 DIAGNOSIS — I5031 Acute diastolic (congestive) heart failure: Secondary | ICD-10-CM | POA: Diagnosis present

## 2021-01-25 LAB — BASIC METABOLIC PANEL
Anion gap: 7 (ref 5–15)
BUN: 15 mg/dL (ref 8–23)
CO2: 23 mmol/L (ref 22–32)
Calcium: 8.1 mg/dL — ABNORMAL LOW (ref 8.9–10.3)
Chloride: 100 mmol/L (ref 98–111)
Creatinine, Ser: 0.87 mg/dL (ref 0.44–1.00)
GFR, Estimated: >60 mL/min (ref >60)
Glucose, Bld: 98 mg/dL (ref 70–99)
Potassium: 4.5 mmol/L (ref 3.5–5.1)
Sodium: 130 mmol/L — ABNORMAL LOW (ref 135–145)

## 2021-01-25 NOTE — Telephone Encounter (Signed)
-----   Message from Loel Dubonnet, NP sent at 01/25/2021  4:20 PM EST ----- Normal kidney function. Stable low calcium compared to previous. Her sodium level remains low. Recommend <2L fluid per day. Recommend reducing Lasix to 10mg  daily with repeat BMP at the [place] in 2 weeks.

## 2021-01-25 NOTE — Telephone Encounter (Signed)
Spoke to pt, notified of lab results and provider's recc.  Pt states that she has not been taking Lasix.  Wanted to give you update for further recc.  Pt says she has only been taking for ankle swelling and she has not hand any swelling lately.

## 2021-01-28 ENCOUNTER — Ambulatory Visit: Payer: Medicare Other | Admitting: Cardiovascular Disease

## 2021-01-28 NOTE — Telephone Encounter (Signed)
Thank you for making me aware. I recommend she follow up with her primary care provider regarding persistently low sodium despite no diuretic. I have forwarded recent lab results to him as well but would be beneficial for her to call their office for an appointment to discuss.   If she has appointment with them, no need for repeat lab work as previously mentioned. If she is unable to get an appointment with them, would recommend she continue with plan for BMP in 2 weeks.   Loel Dubonnet, NP

## 2021-01-31 NOTE — Telephone Encounter (Signed)
Pt notified of provider's recc. Pt states that she has an appt with her PCP Dr. Doy Hutching today.  And she will discuss low sodium at this visit.

## 2021-02-11 ENCOUNTER — Encounter: Payer: Self-pay | Admitting: Occupational Therapy

## 2021-02-11 ENCOUNTER — Ambulatory Visit: Payer: Medicare Other | Attending: Rheumatology | Admitting: Occupational Therapy

## 2021-02-11 ENCOUNTER — Ambulatory Visit: Payer: Medicare Other | Admitting: Cardiovascular Disease

## 2021-02-11 ENCOUNTER — Other Ambulatory Visit: Payer: Self-pay

## 2021-02-11 DIAGNOSIS — M79642 Pain in left hand: Secondary | ICD-10-CM | POA: Insufficient documentation

## 2021-02-11 DIAGNOSIS — M25642 Stiffness of left hand, not elsewhere classified: Secondary | ICD-10-CM | POA: Diagnosis present

## 2021-02-11 DIAGNOSIS — M25641 Stiffness of right hand, not elsewhere classified: Secondary | ICD-10-CM | POA: Insufficient documentation

## 2021-02-11 DIAGNOSIS — M79641 Pain in right hand: Secondary | ICD-10-CM | POA: Diagnosis not present

## 2021-02-11 DIAGNOSIS — M6281 Muscle weakness (generalized): Secondary | ICD-10-CM | POA: Insufficient documentation

## 2021-02-11 NOTE — Patient Instructions (Signed)
Contrast - 2-3 x day  Gentle AROM for wrist and digits  2 x day  Wrist AAROM for RD, UD, and flexion ,extentoin  10 reps  gentle AAROM for digits extention on table  10 reps Tendon glides AROM - blocked 10 reps Opposition alternate digits - 8 reps each - pick up 1 cm foam block

## 2021-02-11 NOTE — Therapy (Signed)
[facility] [address] [place], [address] Phone: [phone]   Fax:  [phone]  Occupational Therapy Evaluation  Patient Details  Name: Erin [name] MRN: [id number] Date of Birth: January 06, 1946 Referring Provider (OT): Dr Jefm Bryant   Encounter Date: 02/11/2021   OT End of Session - 02/11/21 2144    Visit Number 1    Number of Visits 4    Date for OT Re-Evaluation 03/11/21    OT Start Time 1430    OT Stop Time 1520    OT Time Calculation (min) 50 min    Activity Tolerance Patient tolerated treatment well    Behavior During Therapy [facility] for tasks assessed/performed           Past Medical History:  Diagnosis Date  . Anemia    h/o  . Cancer ([place])    melanoma x 3the last one was of her leg and surgeon removed it  . Colon polyp   . Complication of anesthesia    PT STATED THAT WITH JOINT SURGERY THAT SHE WAS GIVEN SOME TYPE OF ANESTHESIA THAT MADE HER HALLUCINATE   . Coronary artery calcification   . Coronary artery disease   . DDD (degenerative disc disease), lumbar   . Diverticulosis 01/20/2017  . Dysphagia   . Dyspnea    with exertion-unable to walk a mile without getting sob- dr sparks set pt up to see Dr Rockey Situ after 11-19-16 surgery  . Environmental allergies   . Esophageal dysmotility   . Fibrocystic breast disease   . GERD (gastroesophageal reflux disease)   . Heart murmur    asymptomatic  . Hyperlipidemia    unspecified  . Hypertension   . Mitral regurgitation   . Monilial esophagitis ([place]) 01/20/2017  . Obstipation   . Osteopenia   . Peptic ulcer disease   . Pulmonary fibrosis ([place])    per Dr Raul Del  . RA (rheumatoid arthritis) ([place])   . Raynaud's disease   . Redundant colon 01/20/2017  . Scleroderma ([place])   . Telangiectasia of colon   . Tubular adenoma of colon 01/20/2017   unspecified  . Vulvar dysplasia     Past Surgical History:  Procedure Laterality Date  .  ABDOMINAL HYSTERECTOMY    . BREAST CYST EXCISION Left 20+ years ago   No scar visible  . BREAST LUMPECTOMY Right   . CARDIAC CATHETERIZATION    . CARPAL TUNNEL RELEASE Bilateral   . COLON SURGERY     colon polyp   . COLONOSCOPY  09/30/2011   tubular adenoma rtm  . COLONOSCOPY     05/02/2003, 04/10/2000  . COLONOSCOPY WITH PROPOFOL N/A 01/20/2017   Procedure: COLONOSCOPY WITH PROPOFOL;  Surgeon: Lollie Sails, MD;  [facility] ENDOSCOPY;  Service: Endoscopy;  Laterality: N/A;  . COLONOSCOPY WITH PROPOFOL N/A 09/17/2020   Procedure: COLONOSCOPY WITH PROPOFOL;  Surgeon: Lesly Rubenstein, MD;  Location: [facility] ENDOSCOPY;  Service: Endoscopy;  Laterality: N/A;  . CORONARY ATHERECTOMY N/A 11/21/2020   Procedure: CORONARY ATHERECTOMY;  Surgeon: Wellington Hampshire, MD;  Location: [place] CV LAB;  Service: Cardiovascular;  Laterality: N/A;  . CORONARY STENT INTERVENTION  11/21/2020  . CORONARY STENT INTERVENTION N/A 11/21/2020   Procedure: CORONARY STENT INTERVENTION;  Surgeon: Wellington Hampshire, MD;  Location: [place] CV LAB;  Service: Cardiovascular;  Laterality: N/A;  . ESOPHAGOGASTRODUODENOSCOPY    . ESOPHAGOGASTRODUODENOSCOPY     09/30/2011, 04/10/2000 , no repeat rtm  .  ESOPHAGOGASTRODUODENOSCOPY N/A 03/02/2018   Procedure: ESOPHAGOGASTRODUODENOSCOPY (EGD);  Surgeon: Virgel Manifold, MD;  Location: [facility] ENDOSCOPY;  Service: Endoscopy;  Laterality: N/A;  . ESOPHAGOGASTRODUODENOSCOPY (EGD) WITH PROPOFOL N/A 01/20/2017   Procedure: ESOPHAGOGASTRODUODENOSCOPY (EGD) WITH PROPOFOL;  Surgeon: Lollie Sails, MD;  Location: [facility] ENDOSCOPY;  Service: Endoscopy;  Laterality: N/A;  . ESOPHAGOGASTRODUODENOSCOPY (EGD) WITH PROPOFOL N/A 09/17/2020   Procedure: ESOPHAGOGASTRODUODENOSCOPY (EGD) WITH PROPOFOL;  Surgeon: Lesly Rubenstein, MD;  Location: [facility] ENDOSCOPY;  Service: Endoscopy;  Laterality: N/A;  . EXCISION HYDRADENITIS LABIA Left 11/19/2016   Procedure: EXCISION LABIAL MASS;   Surgeon: Robert Bellow, MD;  Location: [facility] ORS;  Service: General;  Laterality: Left;  . EYE SURGERY Bilateral    cataracts  . HEEL SPUR EXCISION    . HIP ARTHROPLASTY Left 02/23/2018   Procedure: ARTHROPLASTY BIPOLAR HIP (HEMIARTHROPLASTY);  Surgeon: Thornton Park, MD;  Location: [facility] ORS;  Service: Orthopedics;  Laterality: Left;  . KYPHOPLASTY N/A 04/26/2019   Procedure: KYPHOPLASTY T7;  Surgeon: Hessie Knows, MD;  Location: [facility] ORS;  Service: Orthopedics;  Laterality: N/A;  . KYPHOPLASTY N/A 05/17/2019   Procedure: KYPHOPLASTY T8;  Surgeon: Hessie Knows, MD;  Location: [facility] ORS;  Service: Orthopedics;  Laterality: N/A;  . KYPHOPLASTY N/A 07/26/2019   Procedure: T-9 KYPHOPLASTY;  Surgeon: Hessie Knows, MD;  Location: [facility] ORS;  Service: Orthopedics;  Laterality: N/A;  . MASS EXCISION Left 11/19/2016   Procedure: EXCISION LEFT THIGH MELANOMA;  Surgeon: Robert Bellow, MD;  Location: [facility] ORS;  Service: General;  Laterality: Left;  . RIGHT/LEFT HEART CATH AND CORONARY ANGIOGRAPHY N/A 11/15/2020   Procedure: RIGHT/LEFT HEART CATH AND CORONARY ANGIOGRAPHY poss intervention;  Surgeon: Minna Merritts, MD;  Location: [place] CV LAB;  Service: Cardiovascular;  Laterality: N/A;  . SENTINEL NODE BIOPSY Left 11/19/2016   Procedure: INGUINAL SENTINEL NODE BIOPSY;  Surgeon: Robert Bellow, MD;  Location: [facility] ORS;  Service: General;  Laterality: Left;  . UPPER ESOPHAGEAL ENDOSCOPIC ULTRASOUND (EUS) N/A 01/29/2017   Procedure: UPPER ESOPHAGEAL ENDOSCOPIC ULTRASOUND (EUS);  Surgeon: Holly Bodily, MD;  Location: [facility] ENDOSCOPY;  Service: Endoscopy;  Laterality: N/A;    There were no vitals filed for this visit.   Subjective Assessment - 02/11/21 2128    Subjective  My hands been bothering me for while but gradually got worse so that I have hard time cutting and peeling vegtables, lift tea , doing trash, pull objects, pain getting worse    Pertinent History Dr Jefm Bryant note  from 01/31/21 - Scleroderma, lung disease seems to be stable on antifibrotic agents. Persistent raynauds with episodic ulceration but no active today. More skin thickening in the hands at rest  a.  Raynaud's, sclerodactyly, telangiectasias, positive anti-SCL 70 antibody    b.  Inflammatory destructive arthritis, positive rheumatoid factor, positive anti-CCP antibodies.    c.  Methotrexate, Prednisone, Enbrel.    d.  S/p Remicade.    e.  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 On OFEV    f.  Mild pulmonary hypertension.    g.  s/p Orencia.    h.  Esophageal dysmotility.    i.  Colon telangiectasias.    J.  Plaquenil    - wants her to decrease prednisoe from 10mg  to 5mg  - but pt report she is still doing 10mg  -otherwise her pain to much - tried voltaren on fingers and CBD oil but not helping - pain during day worse - refer to hand therapy/OT    Patient Stated Goals Want  to get the pain in my hands better so I can grip , lift and hold object better to take care of myself - I live alone    Currently in Pain? Yes    Pain Score 6     Pain Location Hand    Pain Orientation Right;Left    Pain Descriptors / Indicators Aching;Throbbing    Pain Type Chronic pain    Pain Onset More than a month ago    Pain Frequency Constant    Aggravating Factors  during day             [facility] OT Assessment - 02/11/21 0001      Assessment   Medical Diagnosis bilateral hand pain - scleroderma    Referring Provider (OT) Dr Jefm Bryant    Onset Date/Surgical Date 01/31/21   refer to OT/hand therapy   Hand Dominance Right      Home  Environment   Lives With Alone      Prior Function   Vocation Retired    Leisure watch tv, play solitair, take care of house , cook herself      AROM   Right Wrist Extension 50 Degrees    Right Wrist Flexion 25 Degrees    Right Wrist Radial Deviation 10 Degrees    Right Wrist Ulnar Deviation 20 Degrees    Left Wrist Extension 55 Degrees    Left Wrist Flexion 50 Degrees    Left Wrist Radial  Deviation 20 Degrees    Left Wrist Ulnar Deviation 18 Degrees      Right Hand AROM   R Thumb MCP 0-60 70 Degrees   -35   R Thumb IP 0-80 40 Degrees    R Thumb Radial ABduction/ADduction 0-55 55    R Thumb Palmar ABduction/ADduction 0-45 50    R Thumb Opposition to Index --   opposition to 5th   R Index  MCP 0-90 70 Degrees   -35   R Index PIP 0-100 55 Degrees    R Long  MCP 0-90 75 Degrees    R Long PIP 0-100 50 Degrees   -40   R Ring  MCP 0-90 75 Degrees    R Ring PIP 0-100 90 Degrees    R Little  MCP 0-90 75 Degrees    R Little PIP 0-100 90 Degrees   -80     Left Hand AROM   L Thumb MCP 0-60 65 Degrees    L Thumb IP 0-80 35 Degrees    L Thumb Radial ADduction/ABduction 0-55 50    L Thumb Palmar ADduction/ABduction 0-45 60    L Thumb Opposition to Index --   opposition to 5th   L Index  MCP 0-90 75 Degrees    L Index PIP 0-100 55 Degrees   -40   L Index DIP 0-70 --   -30   L Long  MCP 0-90 75 Degrees    L Long PIP 0-100 90 Degrees    L Long DIP 0-70 --   -70   L Ring  MCP 0-90 75 Degrees    L Ring PIP 0-100 100 Degrees   -30   L Little  MCP 0-90 90 Degrees    L Little PIP 0-100 100 Degrees                    OT Treatments/Exercises (OP) - 02/11/21 0001      RUE Contrast Bath   Time 8 minutes  Comments decrease pain - prior to review of HEP      LUE Contrast Bath   Time 8 minutes    Comments decrease pain - prior to review of HEP           Contrast - 2-3 x day  - done in clinic this date and show increase AROM   Gentle AROM for wrist and digits  2 x day  Wrist AAROM for RD, UD, and flexion ,extentoin  10 reps  gentle AAROM for digits extention on table  10 reps Tendon glides AROM - blocked 10 reps Opposition alternate digits - 8 reps each - pick up 1 cm foam block         OT Education - 02/11/21 2144    Education Details findings of eval and HEP review    Person(s) Educated Patient    Methods Explanation;Demonstration;Tactile  cues;Verbal cues;Handout    Comprehension Verbal cues required;Returned demonstration;Verbalized understanding            OT Short Term Goals - 02/11/21 2149      OT SHORT TERM GOAL #1   Title Pt to be independent in HEP to increase and maintain AROM in bilateral hands and wrist    Baseline no knowledge - for HEP nad decrease AROM and increase pain    Time 3    Period Weeks    Status New    Target Date 03/04/21             OT Long Term Goals - 02/11/21 2150      OT LONG TERM GOAL #1   Title Pt show decrease pain in bilateral hands and wrist to less than 3/10 during day to report increase ease usings them in cuttint, holding tea and doing trash    Baseline pain increase to 6-9/10 during day and increase stiffness    Time 4    Period Weeks    Status New    Target Date 03/11/21      OT LONG TERM GOAL #2   Title Pt to verbalize 3 joint protection and AE to decrease pain and increase ease of using hands in ADL's and IADL's    Baseline no knowledge -gradually increase difficulty using hands    Time 4    Period Weeks    Status New    Target Date 03/11/21                 Plan - 02/11/21 2145    Clinical Impression Statement Pt present at OT eval with diagnosis of scleroderma - pt with increase pain and stiffness in bilateral hands- increase flexor contractures in some digits and joints - with pain 6-8/10 - pt show decrease AROM in flexion and extention of digits - increase pain and decrease ROM in bilateral wrist- decrease strength with increase functional use of bilateral hands in ADL's and IADL's - pt can benefit from OT services and education of HEP and AE trng/ modifications    OT Occupational Profile and History Problem Focused Assessment - Including review of records relating to presenting problem    Occupational performance deficits (Please refer to evaluation for details): ADL's;IADL's;Play;Social Participation;Leisure    Body Structure / Function / Physical  Skills ADL;Edema;Decreased knowledge of use of DME;FMC;Flexibility;ROM;UE functional use;Pain;Strength;IADL    Rehab Potential Fair    Clinical Decision Making Limited treatment options, no task modification necessary    Comorbidities Affecting Occupational Performance: May have comorbidities impacting occupational performance   scleroderma, OA  Modification or Assistance to Complete Evaluation  No modification of tasks or assist necessary to complete eval    OT Frequency --   2 x wk for one wk, 1 x and then biweekly   OT Duration 4 weeks    OT Treatment/Interventions Self-care/ADL training;Contrast Bath;Fluidtherapy;Paraffin;Therapeutic exercise;DME and/or AE instruction;Manual Therapy;Splinting;Patient/family education    Consulted and Agree with Plan of Care Patient           Patient will benefit from skilled therapeutic intervention in order to improve the following deficits and impairments:   Body Structure / Function / Physical Skills: ADL,Edema,Decreased knowledge of use of DME,FMC,Flexibility,ROM,UE functional use,Pain,Strength,IADL       Visit Diagnosis: Pain in both hands - Plan: Ot plan of care cert/re-cert  Stiffness of right hand, not elsewhere classified - Plan: Ot plan of care cert/re-cert  Stiffness of left hand, not elsewhere classified - Plan: Ot plan of care cert/re-cert  Muscle weakness (generalized) - Plan: Ot plan of care cert/re-cert    Problem List Patient Active Problem List   Diagnosis Date Noted  . Angina pectoris ([place]) 11/21/2020  . Unstable angina ([place]) 10/30/2020  . Bilateral carotid artery stenosis 10/30/2020  . Colon polyp 08/31/2019  . [name] S/P percutaneous coronary angioplasty 08/31/2019  . Degenerative disc disease, lumbar 08/31/2019  . Dysphagia 08/31/2019  . Environmental allergies 08/31/2019  . Esophageal dysmotility 08/31/2019  . Fibrocystic breast disease 08/31/2019  . Mitral regurgitation 08/31/2019  . Acute respiratory disease due  to COVID-19 virus 05/28/2019  . Food in esophagus causing other injury, initial encounter 04/21/2019  . Gastro-esophageal reflux disease with esophagitis 04/21/2019  . Gastric nodule 04/21/2019  . Varicose veins of leg with swelling, bilateral 06/25/2018  . Acute diastolic CHF (congestive heart failure) ([place]) 03/18/2018  . Lower extremity edema 03/18/2018  . Age-related osteoporosis with current pathological fracture 03/04/2018  . Symptomatic anemia 02/28/2018  . S/P hip hemiarthroplasty 02/26/2018  . Hip fracture ([place]) 02/22/2018  . Lumbar radiculopathy 01/20/2018  . Polyneuropathy 01/20/2018  . Chronic insomnia 01/15/2018  . Hyperlipidemia 10/26/2017  . Coronary artery disease of native artery of native heart with stable angina pectoris ([place]) 10/24/2017  . Vulvar irritation 09/02/2017  . 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 SOB (shortness of breath) 12/15/2016  . Idiopathic interstitial fibrosis of lung syndrome ([place]) 12/15/2016  . Raynaud's disease 12/15/2016  . Melanoma of skin ([place]) 11/11/2016  . Lesion of labia 11/11/2016  . HTN, goal below 140/90 08/29/2014  . Encounter for long-term current use of medication 03/30/2014  . Scleroderma ([place]) 03/15/2014  . Rheumatoid arthritis ([place]) 03/15/2014  . Osteopenia 03/15/2014    Rosalyn Gess OTR/L,CLT 02/11/2021, 9:55 PM  [facility] [address], [place], [address] Phone: [phone]   Fax:  [phone]  Name: Erin [name] MRN: [id number] Date of Birth: [birth year]

## 2021-02-18 ENCOUNTER — Ambulatory Visit: Payer: Medicare Other | Admitting: Occupational Therapy

## 2021-02-18 ENCOUNTER — Other Ambulatory Visit: Payer: Self-pay

## 2021-02-18 DIAGNOSIS — M25641 Stiffness of right hand, not elsewhere classified: Secondary | ICD-10-CM

## 2021-02-18 DIAGNOSIS — M6281 Muscle weakness (generalized): Secondary | ICD-10-CM

## 2021-02-18 DIAGNOSIS — M79641 Pain in right hand: Secondary | ICD-10-CM | POA: Diagnosis not present

## 2021-02-18 DIAGNOSIS — M79642 Pain in left hand: Secondary | ICD-10-CM

## 2021-02-18 DIAGNOSIS — M25642 Stiffness of left hand, not elsewhere classified: Secondary | ICD-10-CM

## 2021-02-18 NOTE — Therapy (Signed)
[facility] [address] [place], [address] Phone: [phone]   Fax:  [phone]  Occupational Therapy Treatment  Patient Details  Name: Erin [name] MRN: [id number] Date of Birth: 12/19/1945 Referring Provider (OT): Dr Jefm Bryant   Encounter Date: 02/18/2021   OT End of Session - 02/18/21 1433    Visit Number 2    Number of Visits 4    Date for OT Re-Evaluation 03/11/21    OT Start Time 1433    OT Stop Time 1520    OT Time Calculation (min) 47 min    Activity Tolerance Patient tolerated treatment well    Behavior During Therapy [facility] for tasks assessed/performed           Past Medical History:  Diagnosis Date  . Anemia    h/o  . Cancer ([place])    melanoma x 3the last one was of her leg and surgeon removed it  . Colon polyp   . Complication of anesthesia    PT STATED THAT WITH JOINT SURGERY THAT SHE WAS GIVEN SOME TYPE OF ANESTHESIA THAT MADE HER HALLUCINATE   . Coronary artery calcification   . Coronary artery disease   . DDD (degenerative disc disease), lumbar   . Diverticulosis 01/20/2017  . Dysphagia   . Dyspnea    with exertion-unable to walk a mile without getting sob- dr sparks set pt up to see Dr Rockey Situ after 11-19-16 surgery  . Environmental allergies   . Esophageal dysmotility   . Fibrocystic breast disease   . GERD (gastroesophageal reflux disease)   . Heart murmur    asymptomatic  . Hyperlipidemia    unspecified  . Hypertension   . Mitral regurgitation   . Monilial esophagitis ([place]) 01/20/2017  . Obstipation   . Osteopenia   . Peptic ulcer disease   . Pulmonary fibrosis ([place])    per Dr Raul Del  . RA (rheumatoid arthritis) ([place])   . Raynaud's disease   . Redundant colon 01/20/2017  . Scleroderma ([place])   . Telangiectasia of colon   . Tubular adenoma of colon 01/20/2017   unspecified  . Vulvar dysplasia     Past Surgical History:  Procedure Laterality Date  .  ABDOMINAL HYSTERECTOMY    . BREAST CYST EXCISION Left 20+ years ago   No scar visible  . BREAST LUMPECTOMY Right   . CARDIAC CATHETERIZATION    . CARPAL TUNNEL RELEASE Bilateral   . COLON SURGERY     colon polyp   . COLONOSCOPY  09/30/2011   tubular adenoma rtm  . COLONOSCOPY     05/02/2003, 04/10/2000  . COLONOSCOPY WITH PROPOFOL N/A 01/20/2017   Procedure: COLONOSCOPY WITH PROPOFOL;  Surgeon: Lollie Sails, MD;  Location: [facility] ENDOSCOPY;  Service: Endoscopy;  Laterality: N/A;  . COLONOSCOPY WITH PROPOFOL N/A 09/17/2020   Procedure: COLONOSCOPY WITH PROPOFOL;  Surgeon: Lesly Rubenstein, MD;  Location: [facility] ENDOSCOPY;  Service: Endoscopy;  Laterality: N/A;  . CORONARY ATHERECTOMY N/A 11/21/2020   Procedure: CORONARY ATHERECTOMY;  Surgeon: Wellington Hampshire, MD;  Location: [facility] CV LAB;  Service: Cardiovascular;  Laterality: N/A;  . CORONARY STENT INTERVENTION  11/21/2020  . CORONARY STENT INTERVENTION N/A 11/21/2020   Procedure: CORONARY STENT INTERVENTION;  Surgeon: Wellington Hampshire, MD;  Location: [place] [facility] LAB;  Service: Cardiovascular;  Laterality: N/A;  . ESOPHAGOGASTRODUODENOSCOPY    . ESOPHAGOGASTRODUODENOSCOPY     09/30/2011, 04/10/2000 , no repeat rtm  .  ESOPHAGOGASTRODUODENOSCOPY N/A 03/02/2018   Procedure: ESOPHAGOGASTRODUODENOSCOPY (EGD);  Surgeon: Virgel Manifold, MD;  Location: [facility] ENDOSCOPY;  Service: Endoscopy;  Laterality: N/A;  . ESOPHAGOGASTRODUODENOSCOPY (EGD) WITH PROPOFOL N/A 01/20/2017   Procedure: ESOPHAGOGASTRODUODENOSCOPY (EGD) WITH PROPOFOL;  Surgeon: Lollie Sails, MD;  Location: [facility] ENDOSCOPY;  Service: Endoscopy;  Laterality: N/A;  . ESOPHAGOGASTRODUODENOSCOPY (EGD) WITH PROPOFOL N/A 09/17/2020   Procedure: ESOPHAGOGASTRODUODENOSCOPY (EGD) WITH PROPOFOL;  Surgeon: Lesly Rubenstein, MD;  Location: [facility] ENDOSCOPY;  Service: Endoscopy;  Laterality: N/A;  . EXCISION HYDRADENITIS LABIA Left 11/19/2016   Procedure: EXCISION LABIAL MASS;   Surgeon: Robert Bellow, MD;  Location: [facility] ORS;  Service: General;  Laterality: Left;  . EYE SURGERY Bilateral    cataracts  . HEEL SPUR EXCISION    . HIP ARTHROPLASTY Left 02/23/2018   Procedure: ARTHROPLASTY BIPOLAR HIP (HEMIARTHROPLASTY);  Surgeon: Thornton Park, MD;  Location: [facility] ORS;  Service: Orthopedics;  Laterality: Left;  . KYPHOPLASTY N/A 04/26/2019   Procedure: KYPHOPLASTY T7;  Surgeon: Hessie Knows, MD;  Location: [facility] ORS;  Service: Orthopedics;  Laterality: N/A;  . KYPHOPLASTY N/A 05/17/2019   Procedure: KYPHOPLASTY T8;  Surgeon: Hessie Knows, MD;  Location: [facility] ORS;  Service: Orthopedics;  Laterality: N/A;  . KYPHOPLASTY N/A 07/26/2019   Procedure: T-9 KYPHOPLASTY;  Surgeon: Hessie Knows, MD;  Location: [facility] ORS;  Service: Orthopedics;  Laterality: N/A;  . MASS EXCISION Left 11/19/2016   Procedure: EXCISION LEFT THIGH MELANOMA;  Surgeon: Robert Bellow, MD;  Location: [facility] ORS;  Service: General;  Laterality: Left;  . RIGHT/LEFT HEART CATH AND CORONARY ANGIOGRAPHY N/A 11/15/2020   Procedure: RIGHT/LEFT HEART CATH AND CORONARY ANGIOGRAPHY poss intervention;  Surgeon: Minna Merritts, MD;  Location: [facility] [place] [facility] LAB;  Service: Cardiovascular;  Laterality: N/A;  . SENTINEL NODE BIOPSY Left 11/19/2016   Procedure: INGUINAL SENTINEL NODE BIOPSY;  Surgeon: Robert Bellow, MD;  Location: [facility] ORS;  Service: General;  Laterality: Left;  . UPPER ESOPHAGEAL ENDOSCOPIC ULTRASOUND (EUS) N/A 01/29/2017   Procedure: UPPER ESOPHAGEAL ENDOSCOPIC ULTRASOUND (EUS);  Surgeon: Holly Bodily, MD;  Location: [facility] ENDOSCOPY;  Service: Endoscopy;  Laterality: N/A;    There were no vitals filed for this visit.   Subjective Assessment - 02/18/21 1433    Subjective  My hands are more sore  from doing exercises- I think I have little more motion in the L more than the R - still have hard time gripping things , lifting and holding objects    Pertinent History Dr Jefm Bryant note  from 01/31/21 - Scleroderma, lung disease seems to be stable on antifibrotic agents. Persistent raynauds with episodic ulceration but no active today. More skin thickening in the hands at rest  a.  Raynaud's, sclerodactyly, telangiectasias, positive anti-SCL 70 antibody    b.  Inflammatory destructive arthritis, positive rheumatoid factor, positive anti-CCP antibodies.    c.  Methotrexate, Prednisone, Enbrel.    d.  S/p Remicade.    e.  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 On OFEV    f.  Mild pulmonary hypertension.    g.  s/p Orencia.    h.  Esophageal dysmotility.    i.  Colon telangiectasias.    J.  Plaquenil    - wants her to decrease prednisoe from 10mg  to 5mg  - but pt report she is still doing 10mg  -otherwise her pain to much - tried voltaren on fingers and CBD oil but not helping - pain during day worse - refer to hand therapy/OT  Patient Stated Goals Want to get the pain in my hands better so I can grip , lift and hold object better to take care of myself - I live alone    Currently in Pain? Yes    Pain Score 6     Pain Location Hand    Pain Orientation Right;Left    Pain Descriptors / Indicators Aching;Throbbing    Pain Type Chronic pain    Pain Onset More than a month ago    Pain Frequency Constant              [facility] OT Assessment - 02/18/21 0001      Right Hand AROM   R Index  MCP 0-90 75 Degrees   -35   R Index PIP 0-100 60 Degrees    R Long  MCP 0-90 75 Degrees    R Long PIP 0-100 50 Degrees   -40   R Ring  MCP 0-90 75 Degrees    R Ring PIP 0-100 90 Degrees    R Little  MCP 0-90 75 Degrees    R Little PIP 0-100 90 Degrees   -80     Left Hand AROM   L Index  MCP 0-90 85 Degrees    L Index PIP 0-100 60 Degrees   -40   L Index DIP 0-70 --   -30   L Long  MCP 0-90 80 Degrees    L Long PIP 0-100 90 Degrees    L Long DIP 0-70 --   -70   L Ring  MCP 0-90 85 Degrees    L Ring PIP 0-100 100 Degrees   -30   L Little  MCP 0-90 90 Degrees    L Little PIP 0-100 95 Degrees           AROM for bilateral hands digits assess- extention about same and tender over some of her digits  Flexion increase about 10 degrees over L MC's -and some of her PIP's  - mostly bilateral 2nd digit PIP See flow sheet    JOint protection principles ed  And AE trng  - respect pain , use larger joints and enlarge grips- done some cutting with knife and fork , nail clipper with larger grip or suction cup Buttons hook and [employer] for kitchen tooks  Penagain for writing -  Spring loaded scissors -and use zip lock bags   hand out review and provided for pt  Also tips on modifications in kitchen tasks       Pt report increase soreness in digits - review HEP after some heat this date   OT Treatments/Exercises (OP) - 02/18/21 0001      Moist Heat Therapy   Number Minutes Moist Heat 5 Minutes    Moist Heat Location --   prior to review of AROM painfree           Pt to cont at home Contrast - 2-3 x day  -  And provided bilateral 3 rd digit silicon compression to decrease pain   Gentle AROM for wrist review again - needed min A  And then     Put her extention ON HOLD - suspect causing some of her pain in digits ( gentle AAROM for digits extention on table)  10 reps Tendon glides AROM - blocked 10 reps - Review and pt to only do AROM - pain free - NOT force it  Do AROM composite fist R hand to foam roller -and  L hand to thumb out of palm   pain free - Opposition alternate digits - 8 reps each - pick up 1 cm foam block -        OT Education - 02/18/21 1718    Education Details progress and changes to HEP/AE and joint protection    Person(s) Educated Patient    Methods Explanation;Demonstration;Tactile cues;Verbal cues;Handout    Comprehension Verbal cues required;Returned demonstration;Verbalized understanding            OT Short Term Goals - 02/11/21 2149      OT SHORT TERM GOAL #1   Title Pt to be independent in HEP to increase and maintain AROM in bilateral hands and  wrist    Baseline no knowledge - for HEP nad decrease AROM and increase pain    Time 3    Period Weeks    Status New    Target Date 03/04/21             OT Long Term Goals - 02/11/21 2150      OT LONG TERM GOAL #1   Title Pt show decrease pain in bilateral hands and wrist to less than 3/10 during day to report increase ease usings them in cuttint, holding tea and doing trash    Baseline pain increase to 6-9/10 during day and increase stiffness    Time 4    Period Weeks    Status New    Target Date 03/11/21      OT LONG TERM GOAL #2   Title Pt to verbalize 3 joint protection and AE to decrease pain and increase ease of using hands in ADL's and IADL's    Baseline no knowledge -gradually increase difficulty using hands    Time 4    Period Weeks    Status New    Target Date 03/11/21                 Plan - 02/18/21 1433    Clinical Impression Statement Pt showed increase AROM in L MC's and 2nd PIP- less progress in R hand - but increase soreness per pt - did review her HEP - put her AAROM extention of digits on hold and review for pt to do AROM - and block gentle - not force- pt was forcing motion. DId also review with pt joint protection and AE - to increase ease of ADL's and decrease pain in digits    OT Occupational Profile and History Problem Focused Assessment - Including review of records relating to presenting problem    Occupational performance deficits (Please refer to evaluation for details): ADL's;IADL's;Play;Social Participation;Leisure    Body Structure / Function / Physical Skills ADL;Edema;Decreased knowledge of use of DME;FMC;Flexibility;ROM;UE functional use;Pain;Strength;IADL    Rehab Potential Fair    Clinical Decision Making Limited treatment options, no task modification necessary    Comorbidities Affecting Occupational Performance: May have comorbidities impacting occupational performance    Modification or Assistance to Complete Evaluation  No  modification of tasks or assist necessary to complete eval    OT Frequency 2x / week    OT Duration 4 weeks    OT Treatment/Interventions Self-care/ADL training;Contrast Bath;Fluidtherapy;Paraffin;Therapeutic exercise;DME and/or AE instruction;Manual Therapy;Splinting;Patient/family education    Consulted and Agree with Plan of Care Patient           Patient will benefit from skilled therapeutic intervention in order to improve the following deficits and impairments:   Body Structure / Function / Physical Skills: ADL,Edema,Decreased knowledge of  use of DME,FMC,Flexibility,ROM,UE functional use,Pain,Strength,IADL       Visit Diagnosis: Pain in both hands  Stiffness of right hand, not elsewhere classified  Stiffness of left hand, not elsewhere classified  Muscle weakness (generalized)    Problem List Patient Active Problem List   Diagnosis Date Noted  . Angina pectoris ([place]) 11/21/2020  . Unstable angina ([place]) 10/30/2020  . Bilateral carotid artery stenosis 10/30/2020  . Colon polyp 08/31/2019  . [name] S/P percutaneous coronary angioplasty 08/31/2019  . Degenerative disc disease, lumbar 08/31/2019  . Dysphagia 08/31/2019  . Environmental allergies 08/31/2019  . Esophageal dysmotility 08/31/2019  . Fibrocystic breast disease 08/31/2019  . Mitral regurgitation 08/31/2019  . Acute respiratory disease due to COVID-19 virus 05/28/2019  . Food in esophagus causing other injury, initial encounter 04/21/2019  . Gastro-esophageal reflux disease with esophagitis 04/21/2019  . Gastric nodule 04/21/2019  . Varicose veins of leg with swelling, bilateral 06/25/2018  . Acute diastolic CHF (congestive heart failure) ([place]) 03/18/2018  . Lower extremity edema 03/18/2018  . Age-related osteoporosis with current pathological fracture 03/04/2018  . Symptomatic anemia 02/28/2018  . S/P hip hemiarthroplasty 02/26/2018  . Hip fracture ([place]) 02/22/2018  . Lumbar radiculopathy 01/20/2018  .  Polyneuropathy 01/20/2018  . Chronic insomnia 01/15/2018  . Hyperlipidemia 10/26/2017  . Coronary artery disease of native artery of native heart with stable angina pectoris ([place]) 10/24/2017  . Vulvar irritation 09/02/2017  . 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 SOB (shortness of breath) 12/15/2016  . Idiopathic interstitial fibrosis of lung syndrome ([place]) 12/15/2016  . Raynaud's disease 12/15/2016  . Melanoma of skin ([place]) 11/11/2016  . Lesion of labia 11/11/2016  . HTN, goal below 140/90 08/29/2014  . Encounter for long-term current use of medication 03/30/2014  . Scleroderma ([place]) 03/15/2014  . Rheumatoid arthritis ([place]) 03/15/2014  . Osteopenia 03/15/2014    Rosalyn Gess OTR/L,CLT 02/18/2021, 5:22 PM  [facility] [address] [place], [address] Phone: [phone]   Fax:  [phone]  Name: MERRITT KIBBY MRN: [id number] Date of Birth: 06-Apr-1946

## 2021-02-21 ENCOUNTER — Encounter: Payer: Medicare Other | Admitting: Occupational Therapy

## 2021-02-23 IMAGING — XA THORACIC SPINE 2 VIEWS
1 series · 1 of 1 positions shown · non-contrast
Comparison: MRI 04/19/2019

CLINICAL DATA: Kyphoplasty

EXAM:
THORACIC SPINE 2 VIEWS; DG C-ARM 61-120 MIN

[Series 4: ortho standard · 1 of 1 slices shown]
[im 1/1]
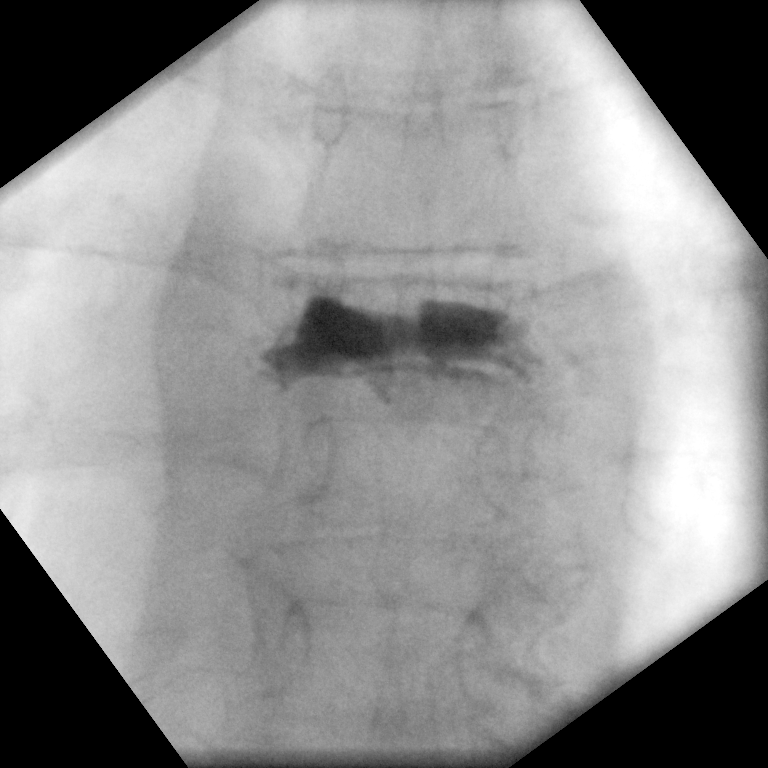

[1 of 1 positions shown; findings below may reference images not displayed]

FINDINGS: Two low resolution intraoperative spot views of the thoracic spine.
Total fluoroscopy time was 2 minutes 36 seconds. Vertebral
augmentation changes at mid to lower thoracic vertebra, presumably
T7.
IMPRESSION: Intraoperative fluoroscopic assistance provided during T7
kyphoplasty

## 2021-02-28 ENCOUNTER — Other Ambulatory Visit: Payer: Self-pay

## 2021-02-28 ENCOUNTER — Ambulatory Visit: Payer: Medicare Other | Admitting: Occupational Therapy

## 2021-02-28 DIAGNOSIS — M79641 Pain in right hand: Secondary | ICD-10-CM

## 2021-02-28 DIAGNOSIS — M6281 Muscle weakness (generalized): Secondary | ICD-10-CM

## 2021-02-28 DIAGNOSIS — M25642 Stiffness of left hand, not elsewhere classified: Secondary | ICD-10-CM

## 2021-02-28 DIAGNOSIS — M25641 Stiffness of right hand, not elsewhere classified: Secondary | ICD-10-CM

## 2021-02-28 NOTE — Therapy (Signed)
[place] [address], [place], [address] Phone: [phone]   Fax:  [phone]  Occupational Therapy Treatment  Patient Details  Name: Erin [name] MRN: [id number] Date of Birth: 1946/08/21 Referring Provider (OT): Dr Jefm Bryant   Encounter Date: 02/28/2021   OT End of Session - 02/28/21 2009    Visit Number 3    Number of Visits 4    Date for OT Re-Evaluation 03/11/21    OT Start Time 1308    OT Stop Time 1400    OT Time Calculation (min) 52 min    Activity Tolerance Patient tolerated treatment well    Behavior During Therapy [facility] for tasks assessed/performed           Past Medical History:  Diagnosis Date  . Anemia    h/o  . Cancer ([place])    melanoma x 3the last one was of her leg and surgeon removed it  . Colon polyp   . Complication of anesthesia    PT STATED THAT WITH JOINT SURGERY THAT SHE WAS GIVEN SOME TYPE OF ANESTHESIA THAT MADE HER HALLUCINATE   . Coronary artery calcification   . Coronary artery disease   . DDD (degenerative disc disease), lumbar   . Diverticulosis 01/20/2017  . Dysphagia   . Dyspnea    with exertion-unable to walk a mile without getting sob- dr sparks set pt up to see Dr Rockey Situ after 11-19-16 surgery  . Environmental allergies   . Esophageal dysmotility   . Fibrocystic breast disease   . GERD (gastroesophageal reflux disease)   . Heart murmur    asymptomatic  . Hyperlipidemia    unspecified  . Hypertension   . Mitral regurgitation   . Monilial esophagitis ([place]) 01/20/2017  . Obstipation   . Osteopenia   . Peptic ulcer disease   . Pulmonary fibrosis ([place])    per Dr Raul Del  . RA (rheumatoid arthritis) ([place])   . Raynaud's disease   . Redundant colon 01/20/2017  . Scleroderma ([place])   . Telangiectasia of colon   . Tubular adenoma of colon 01/20/2017   unspecified  . Vulvar dysplasia     Past Surgical History:  Procedure Laterality Date  .  ABDOMINAL HYSTERECTOMY    . BREAST CYST EXCISION Left 20+ years ago   No scar visible  . BREAST LUMPECTOMY Right   . CARDIAC CATHETERIZATION    . CARPAL TUNNEL RELEASE Bilateral   . COLON SURGERY     colon polyp   . COLONOSCOPY  09/30/2011   tubular adenoma rtm  . COLONOSCOPY     05/02/2003, 04/10/2000  . COLONOSCOPY WITH PROPOFOL N/A 01/20/2017   Procedure: COLONOSCOPY WITH PROPOFOL;  Surgeon: Lollie Sails, MD;  Location: [facility] ENDOSCOPY;  Service: Endoscopy;  Laterality: N/A;  . COLONOSCOPY WITH PROPOFOL N/A 09/17/2020   Procedure: COLONOSCOPY WITH PROPOFOL;  Surgeon: Lesly Rubenstein, MD;  Location: [facility] ENDOSCOPY;  Service: Endoscopy;  Laterality: N/A;  . CORONARY ATHERECTOMY N/A 11/21/2020   Procedure: CORONARY ATHERECTOMY;  Surgeon: Wellington Hampshire, MD;  Location: [facility] CV LAB;  Service: Cardiovascular;  Laterality: N/A;  . CORONARY STENT INTERVENTION  11/21/2020  . CORONARY STENT INTERVENTION N/A 11/21/2020   Procedure: CORONARY STENT INTERVENTION;  Surgeon: Wellington Hampshire, MD;  Location: [facility] LAB;  Service: Cardiovascular;  Laterality: N/A;  . ESOPHAGOGASTRODUODENOSCOPY    . ESOPHAGOGASTRODUODENOSCOPY     09/30/2011, 04/10/2000 , no repeat rtm  .  ESOPHAGOGASTRODUODENOSCOPY N/A 03/02/2018   Procedure: ESOPHAGOGASTRODUODENOSCOPY (EGD);  Surgeon: Virgel Manifold, MD;  Location: [facility] ENDOSCOPY;  Service: Endoscopy;  Laterality: N/A;  . ESOPHAGOGASTRODUODENOSCOPY (EGD) WITH PROPOFOL N/A 01/20/2017   Procedure: ESOPHAGOGASTRODUODENOSCOPY (EGD) WITH PROPOFOL;  Surgeon: Lollie Sails, MD;  Location: [facility] ENDOSCOPY;  Service: Endoscopy;  Laterality: N/A;  . ESOPHAGOGASTRODUODENOSCOPY (EGD) WITH PROPOFOL N/A 09/17/2020   Procedure: ESOPHAGOGASTRODUODENOSCOPY (EGD) WITH PROPOFOL;  Surgeon: Lesly Rubenstein, MD;  Location: [facility] ENDOSCOPY;  Service: Endoscopy;  Laterality: N/A;  . EXCISION HYDRADENITIS LABIA Left 11/19/2016   Procedure: EXCISION LABIAL MASS;   Surgeon: Robert Bellow, MD;  Location: [facility] ORS;  Service: General;  Laterality: Left;  . EYE SURGERY Bilateral    cataracts  . HEEL SPUR EXCISION    . HIP ARTHROPLASTY Left 02/23/2018   Procedure: ARTHROPLASTY BIPOLAR HIP (HEMIARTHROPLASTY);  Surgeon: Thornton Park, MD;  Location: [facility] ORS;  Service: Orthopedics;  Laterality: Left;  . KYPHOPLASTY N/A 04/26/2019   Procedure: KYPHOPLASTY T7;  Surgeon: Hessie Knows, MD;  Location: [facility] ORS;  Service: Orthopedics;  Laterality: N/A;  . KYPHOPLASTY N/A 05/17/2019   Procedure: KYPHOPLASTY T8;  Surgeon: Hessie Knows, MD;  Location: [facility] ORS;  Service: Orthopedics;  Laterality: N/A;  . KYPHOPLASTY N/A 07/26/2019   Procedure: T-9 KYPHOPLASTY;  Surgeon: Hessie Knows, MD;  Location: [facility] ORS;  Service: Orthopedics;  Laterality: N/A;  . MASS EXCISION Left 11/19/2016   Procedure: EXCISION LEFT THIGH MELANOMA;  Surgeon: Robert Bellow, MD;  Location: [facility] ORS;  Service: General;  Laterality: Left;  . RIGHT/LEFT HEART CATH AND CORONARY ANGIOGRAPHY N/A 11/15/2020   Procedure: RIGHT/LEFT HEART CATH AND CORONARY ANGIOGRAPHY poss intervention;  Surgeon: Minna Merritts, MD;  Location: [facility] CV LAB;  Service: Cardiovascular;  Laterality: N/A;  . SENTINEL NODE BIOPSY Left 11/19/2016   Procedure: INGUINAL SENTINEL NODE BIOPSY;  Surgeon: Robert Bellow, MD;  Location: [facility] ORS;  Service: General;  Laterality: Left;  . UPPER ESOPHAGEAL ENDOSCOPIC ULTRASOUND (EUS) N/A 01/29/2017   Procedure: UPPER ESOPHAGEAL ENDOSCOPIC ULTRASOUND (EUS);  Surgeon: Holly Bodily, MD;  Location[facility] ENDOSCOPY;  Service: Endoscopy;  Laterality: N/A;    There were no vitals filed for this visit.   Subjective Assessment - 02/28/21 2004    Subjective  My fingers stays cold - feel them - I did the exercises - but my hands still hurts - some fingers more than others- love the heat - and my back hurting bad today - did bring list of things I have hard time doing -  see if you got some suggestions    Pertinent History Dr Jefm Bryant note from 01/31/21 - Scleroderma, lung disease seems to be stable on antifibrotic agents. Persistent raynauds with episodic ulceration but no active today. More skin thickening in the hands at rest  a.  Raynaud's, sclerodactyly, telangiectasias, positive anti-SCL 70 antibody    b.  Inflammatory destructive arthritis, positive rheumatoid factor, positive anti-CCP antibodies.    c.  Methotrexate, Prednisone, Enbrel.    d.  S/p Remicade.    e.  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 On OFEV    f.  Mild pulmonary hypertension.    g.  s/p Orencia.    h.  Esophageal dysmotility.    i.  Colon telangiectasias.    J.  Plaquenil    - wants her to decrease prednisoe from 10mg  to 5mg  - but pt report she is still doing 10mg  -otherwise her pain to much - tried voltaren on fingers and CBD  oil but not helping - pain during day worse - refer to hand therapy/OT    Patient Stated Goals Want to get the pain in my hands better so I can grip , lift and hold object better to take care of myself - I live alone    Currently in Pain? Yes    Pain Score 4     Pain Location Hand    Pain Orientation Right;Left    Pain Descriptors / Indicators Aching;Throbbing    Pain Type Chronic pain    Pain Onset More than a month ago              [facility] OT Assessment - 02/28/21 0001      AROM   Right Wrist Extension 55 Degrees    Right Wrist Flexion 40 Degrees    Right Wrist Radial Deviation 10 Degrees    Right Wrist Ulnar Deviation 20 Degrees    Left Wrist Extension 60 Degrees    Left Wrist Flexion 50 Degrees    Left Wrist Radial Deviation 20 Degrees    Left Wrist Ulnar Deviation 18 Degrees      Right Hand AROM   R Index  MCP 0-90 80 Degrees   -35   R Index PIP 0-100 65 Degrees    R Long  MCP 0-90 75 Degrees    R Long PIP 0-100 45 Degrees   -40   R Ring  MCP 0-90 75 Degrees    R Ring PIP 0-100 90 Degrees    R Little  MCP 0-90 78 Degrees    R Little PIP 0-100 100  Degrees   -80     Left Hand AROM   L Index  MCP 0-90 80 Degrees    L Index PIP 0-100 60 Degrees   -40   L Index DIP 0-70 --   -30   L Long  MCP 0-90 82 Degrees    L Long PIP 0-100 90 Degrees    L Long DIP 0-70 --   -70   L Ring  MCP 0-90 80 Degrees    L Ring PIP 0-100 100 Degrees   -30   L Little  MCP 0-90 90 Degrees    L Little PIP 0-100 90 Degrees             AROM for bilateral hands digits assess- pt showed some progress from [facility] and able to maintain flexion and extention - pt to cont with HEP for 5 wks and will reassess   Pt with list of tasks at home - she has hard time with - lives alone   Website - arthritis supplies - had some of things she needed  turning key - tried in clinic couple of devices - and info or ordering provided  Same for turning knob on stove and dryer Norvel Richards  Doing eye drops - found her something online - that she could use - info provided   Opening jars - several ones on marker - ed on some of them  Joints protection principles ed reinforce on  - respect pain , use larger joints and enlarge grips- done some cutting with knife and fork , nail clipper with larger grip or suction cup Buttons hook and [employer] for kitchen tooks last time  Do not force her AROM - 'Motion is lotion"  Penagain for writing -  Spring loaded scissors -and use zip lock bags   hand out review and provided for pt  Also tips  on modifications in kitchen tasks    Pt report increase soreness in digits - review HEP after some heat this date                 OT Education - 02/28/21 2009    Education Details HEP and A/E trng and modifications    Person(s) Educated Patient    Methods Explanation;Demonstration;Tactile cues;Verbal cues;Handout    Comprehension Verbal cues required;Returned demonstration;Verbalized understanding            OT Short Term Goals - 02/11/21 2149      OT SHORT TERM GOAL #1   Title Pt to be independent in HEP to increase and maintain  AROM in bilateral hands and wrist    Baseline no knowledge - for HEP nad decrease AROM and increase pain    Time 3    Period Weeks    Status New    Target Date 03/04/21             OT Long Term Goals - 02/11/21 2150      OT LONG TERM GOAL #1   Title Pt show decrease pain in bilateral hands and wrist to less than 3/10 during day to report increase ease usings them in cuttint, holding tea and doing trash    Baseline pain increase to 6-9/10 during day and increase stiffness    Time 4    Period Weeks    Status New    Target Date 03/11/21      OT LONG TERM GOAL #2   Title Pt to verbalize 3 joint protection and AE to decrease pain and increase ease of using hands in ADL's and IADL's    Baseline no knowledge -gradually increase difficulty using hands    Time 4    Period Weeks    Status New    Target Date 03/11/21                 Plan - 02/28/21 2010    Clinical Impression Statement Pt cont to have pain in bilateral hands because of mutiple medical diagnosis - but able to maintain and even increase some of her AROM in digits and wrist- pt educated in AE and modifications to ADL's and IADL's to be independent at home - she lives alone - pt had list of tasks that she had hard time with  and did some AE research with pt and recommendation - pt to do Home[facility] for about 5 wks and will return and assess if she can maintain her AROM and independence at home    OT Occupational Profile and History Problem Focused Assessment - Including review of records relating to presenting problem    Occupational performance deficits (Please refer to evaluation for details): ADL's;IADL's;Play;Social Participation;Leisure    Body Structure / Function / Physical Skills ADL;Edema;Decreased knowledge of use of DME;FMC;Flexibility;ROM;UE functional use;Pain;Strength;IADL    Rehab Potential Fair    Clinical Decision Making Limited treatment options, no task modification necessary    Comorbidities  Affecting Occupational Performance: May have comorbidities impacting occupational performance    Modification or Assistance to Complete Evaluation  No modification of tasks or assist necessary to complete eval    OT Frequency Monthly    OT Duration 4 weeks    OT Treatment/Interventions Self-care/ADL training;Contrast Bath;Fluidtherapy;Paraffin;Therapeutic exercise;DME and/or AE instruction;Manual Therapy;Splinting;Patient/family education    Consulted and Agree with Plan of Care Patient           Patient will benefit from skilled therapeutic intervention  in order to improve the following deficits and impairments:   Body Structure / Function / Physical Skills: ADL,Edema,Decreased knowledge of use of DME,FMC,Flexibility,ROM,UE functional use,Pain,Strength,IADL       Visit Diagnosis: Pain in both hands  Stiffness of right hand, not elsewhere classified  Stiffness of left hand, not elsewhere classified  Muscle weakness (generalized)    Problem List Patient Active Problem List   Diagnosis Date Noted  . Angina pectoris ([place]) 11/21/2020  . Unstable angina ([place]) 10/30/2020  . Bilateral carotid artery stenosis 10/30/2020  . Colon polyp 08/31/2019  . [name] S/P percutaneous coronary angioplasty 08/31/2019  . Degenerative disc disease, lumbar 08/31/2019  . Dysphagia 08/31/2019  . Environmental allergies 08/31/2019  . Esophageal dysmotility 08/31/2019  . Fibrocystic breast disease 08/31/2019  . Mitral regurgitation 08/31/2019  . Acute respiratory disease due to COVID-19 virus 05/28/2019  . Food in esophagus causing other injury, initial encounter 04/21/2019  . Gastro-esophageal reflux disease with esophagitis 04/21/2019  . Gastric nodule 04/21/2019  . Varicose veins of leg with swelling, bilateral 06/25/2018  . Acute diastolic CHF (congestive heart failure) ([place]) 03/18/2018  . Lower extremity edema 03/18/2018  . Age-related osteoporosis with current pathological fracture 03/04/2018   . Symptomatic anemia 02/28/2018  . S/P hip hemiarthroplasty 02/26/2018  . Hip fracture ([place]) 02/22/2018  . Lumbar radiculopathy 01/20/2018  . Polyneuropathy 01/20/2018  . Chronic insomnia 01/15/2018  . Hyperlipidemia 10/26/2017  . Coronary artery disease of native artery of native heart with stable angina pectoris ([place]) 10/24/2017  . Vulvar irritation 09/02/2017  . 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 SOB (shortness of breath) 12/15/2016  . Idiopathic interstitial fibrosis of lung syndrome ([place]) 12/15/2016  . Raynaud's disease 12/15/2016  . Melanoma of skin ([place]) 11/11/2016  . Lesion of labia 11/11/2016  . HTN, goal below 140/90 08/29/2014  . Encounter for long-term current use of medication 03/30/2014  . Scleroderma ([place]) 03/15/2014  . Rheumatoid arthritis ([place]) 03/15/2014  . Osteopenia 03/15/2014    Rosalyn Gess OTR/L,CLT 02/28/2021, 8:14 PM  [facility] [facility] [address] [place], [address] Phone: [phone]   Fax:  [phone]  Name: Erin [name] MRN: [id number] Date of Birth: 08/11/1946

## 2021-03-20 ENCOUNTER — Inpatient Hospital Stay: Payer: Medicare Other

## 2021-03-20 ENCOUNTER — Telehealth: Payer: Self-pay | Admitting: *Deleted

## 2021-03-20 NOTE — Telephone Encounter (Signed)
Patient called reporting that she fell last night and "cracked a rib" and states there is no way she can even get dressed let alone come in for her appointment today at 230.

## 2021-03-20 NOTE — Telephone Encounter (Signed)
Called and spoke with [name]. Appointment rescheduled to 05-01-21 at 1500. Read back performed.

## 2021-03-21 NOTE — Progress Notes (Signed)
Cardiology Office Note  Date:  03/22/2021   ID:  Erin [name], DOB [birth year], MRN [id number]  PCP:  Idelle Crouch, MD   Chief Complaint  Patient presents with  . Follow-up    2 month F/U-no new cardiac concerns    HPI:  Erin [name] is a very pleasant 75 year old woman with history of  [name], Seen on CT scan chest October 2016 mid to distal LAD region Complicated scleroderma,  raynauds disease (toes, hands), arthritis,  chronic interstitial lung disease at the bases seen on CT scan chest x-ray,  who presents for worsening shortness of breath on exertion, [name]  Cardiac catheterization performed November 15, 2020 for unstable angina Severe single-vessel disease mid LAD, heavily calcified at the takeoff of the diagonal branches x2.  Estimated to be 80%.  Normal ejection fraction, normal right heart pressures  Underwent orbital atherectomy and PCI in [place] mid LAD  Last seen in our clinic January 18, 2021  Rare tightness in the chest, "probably in my head" Less swelling  BP well controlled Minimal edema  Labs reviewed A1C 6.1 LDL 66  Continues to have severe complications from scleroderma "cant do much with hands" Trouble turn on car Ray nodes, difficulty swallowing, choking, Chronic shortness of breath On low-dose prednisone 10 mg   on Lasix daily prn, 1 a month Breath mild, stable  Reports gait instability, no falls  Echo 10/2018  Left ventricle: mild LVH. Systolic function was normal.   The estimated ejection fraction was in the range of 60% to 65%.   Left ventricular diastolic function parameters were normal. - Ascending aorta: The ascending aorta was mildly dilated.  Systolic pressure was mildly increased, in   the range of 35 mm Hg to 40 mm Hg.  Several recent hospitalizations March 2019 Had fall with left femoral fracture Surgical repair, subsequent anemia Fall was mechanical, down some steps  Readmitted to the hospital several days later with  worsening anemia At EGD showing small region of erosion Started on iron,  Blood transfusions for hemoglobin of 6 She reports worsening lower extremity edema Albumin/protein low  Previous CXR showing chronic interstitial changes are present at the lung bases.  CT scan results and images  mid to distal LAD coronary calcifications No significant thoracic descending  or ascending aortic atherosclerosis It does show fibrosis at the bases of the lungs bilaterally perhaps worse on the left than the right   PMH:   has a past medical history of Anemia, Cancer ([place]), Colon polyp, Complication of anesthesia, Coronary artery calcification, Coronary artery disease, DDD (degenerative disc disease), lumbar, Diverticulosis (01/20/2017), Dysphagia, Dyspnea, Environmental allergies, Esophageal dysmotility, Fibrocystic breast disease, GERD (gastroesophageal reflux disease), Heart murmur, Hyperlipidemia, Hypertension, Mitral regurgitation, Monilial esophagitis ([place]) (01/20/2017), Obstipation, Osteopenia, Peptic ulcer disease, Pulmonary fibrosis ([place]), RA (rheumatoid arthritis) ([place]), Raynaud's disease, Redundant colon (01/20/2017), Scleroderma ([place]), Telangiectasia of colon, Tubular adenoma of colon (01/20/2017), and Vulvar dysplasia.  PSH:    Past Surgical History:  Procedure Laterality Date  . ABDOMINAL HYSTERECTOMY    . BREAST CYST EXCISION Left 20+ years ago   No scar visible  . BREAST LUMPECTOMY Right   . CARDIAC CATHETERIZATION    . CARPAL TUNNEL RELEASE Bilateral   . COLON SURGERY     colon polyp   . COLONOSCOPY  09/30/2011   tubular adenoma rtm  . COLONOSCOPY     05/02/2003, 04/10/2000  . COLONOSCOPY WITH PROPOFOL N/A 01/20/2017   Procedure: COLONOSCOPY WITH PROPOFOL;  Surgeon: Lollie Sails, MD;  Location: [facility] ENDOSCOPY;  Service: Endoscopy;  Laterality: N/A;  . COLONOSCOPY WITH PROPOFOL N/A 09/17/2020   Procedure: COLONOSCOPY WITH PROPOFOL;  Surgeon: Lesly Rubenstein, MD;  Location: [facility]  ENDOSCOPY;  Service: Endoscopy;  Laterality: N/A;  . CORONARY ATHERECTOMY N/A 11/21/2020   Procedure: CORONARY ATHERECTOMY;  Surgeon: Wellington Hampshire, MD;  Location: [place] [facility];  Service: Cardiovascular;  Laterality: N/A;  . CORONARY STENT INTERVENTION  11/21/2020  . CORONARY STENT INTERVENTION N/A 11/21/2020   Procedure: CORONARY STENT INTERVENTION;  Surgeon: Wellington Hampshire, MD;  Location: [place] [facility];  Service: Cardiovascular;  Laterality: N/A;  . ESOPHAGOGASTRODUODENOSCOPY    . ESOPHAGOGASTRODUODENOSCOPY     09/30/2011, 04/10/2000 , no repeat rtm  . ESOPHAGOGASTRODUODENOSCOPY N/A 03/02/2018   Procedure: ESOPHAGOGASTRODUODENOSCOPY (EGD);  Surgeon: Virgel Manifold, MD;  Location[facility] ENDOSCOPY;  Service: Endoscopy;  Laterality: N/A;  . ESOPHAGOGASTRODUODENOSCOPY (EGD) WITH PROPOFOL N/A 01/20/2017   Procedure: ESOPHAGOGASTRODUODENOSCOPY (EGD) WITH PROPOFOL;  Surgeon: Lollie Sails, MD;  [facility];  Service: Endoscopy;  Laterality: N/A;  . ESOPHAGOGASTRODUODENOSCOPY (EGD) WITH PROPOFOL N/A 09/17/2020   Procedure: ESOPHAGOGASTRODUODENOSCOPY (EGD) WITH PROPOFOL;  Surgeon: Lesly Rubenstein, MD;  Location: [facility] ENDOSCOPY;  Service: Endoscopy;  Laterality: N/A;  . EXCISION HYDRADENITIS LABIA Left 11/19/2016   Procedure: EXCISION LABIAL MASS;  Surgeon: Robert Bellow, MD;  Location: [facility] ORS;  Service: General;  Laterality: Left;  . EYE SURGERY Bilateral    cataracts  . HEEL SPUR EXCISION    . HIP ARTHROPLASTY Left 02/23/2018   Procedure: ARTHROPLASTY BIPOLAR HIP (HEMIARTHROPLASTY);  Surgeon: Thornton Park, MD;  Location: [facility] ORS;  Service: Orthopedics;  Laterality: Left;  . KYPHOPLASTY N/A 04/26/2019   Procedure: KYPHOPLASTY T7;  Surgeon: Hessie Knows, MD;  Location: [facility] ORS;  Service: Orthopedics;  Laterality: N/A;  . KYPHOPLASTY N/A 05/17/2019   Procedure: KYPHOPLASTY T8;  Surgeon: Hessie Knows, MD;  Location: [facility] ORS;  Service: Orthopedics;   Laterality: N/A;  . KYPHOPLASTY N/A 07/26/2019   Procedure: T-9 KYPHOPLASTY;  Surgeon: Hessie Knows, MD;  Location: [facility] ORS;  Service: Orthopedics;  Laterality: N/A;  . MASS EXCISION Left 11/19/2016   Procedure: EXCISION LEFT THIGH MELANOMA;  Surgeon: Robert Bellow, MD;  Location: [facility] ORS;  Service: General;  Laterality: Left;  . RIGHT/LEFT HEART CATH AND CORONARY ANGIOGRAPHY N/A 11/15/2020   Procedure: RIGHT/LEFT HEART CATH AND CORONARY ANGIOGRAPHY poss intervention;  Surgeon: Minna Merritts, MD;  Location: [place] [facility];  Service: Cardiovascular;  Laterality: N/A;  . SENTINEL NODE BIOPSY Left 11/19/2016   Procedure: INGUINAL SENTINEL NODE BIOPSY;  Surgeon: Robert Bellow, MD;  Location: [facility] ORS;  Service: General;  Laterality: Left;  . UPPER ESOPHAGEAL ENDOSCOPIC ULTRASOUND (EUS) N/A 01/29/2017   Procedure: UPPER ESOPHAGEAL ENDOSCOPIC ULTRASOUND (EUS);  Surgeon: Holly Bodily, MD;  Location[facility] ENDOSCOPY;  Service: Endoscopy;  Laterality: N/A;    Current Outpatient Medications  Medication Sig Dispense Refill  . albuterol (VENTOLIN HFA) 108 (90 Base) MCG/ACT inhaler Inhale 2 puffs into the lungs every 4 (four) hours as needed for wheezing or shortness of breath.    . ALPRAZolam (XANAX) 0.5 MG tablet Take 1 tablet (0.5 mg total) by mouth 4 (four) times daily as needed for anxiety. 20 tablet 0  . amLODipine (NORVASC) 5 MG tablet Take 5 mg by mouth daily.     Marland Kitchen aspirin 81 MG tablet Take 81 mg by mouth daily.     Marland Kitchen atorvastatin (LIPITOR) 40 MG tablet Take 1 tablet (  40 mg total) by mouth daily. 90 tablet 1  . clopidogrel (PLAVIX) 75 MG tablet Take 1 tablet (75 mg total) by mouth daily. 90 tablet 1  . dexlansoprazole (DEXILANT) 60 MG capsule Take 1 capsule (60 mg total) by mouth daily. 30 capsule 0  . DULoxetine (CYMBALTA) 60 MG capsule Take 60 mg by mouth daily. In the morning.    . ezetimibe (ZETIA) 10 MG tablet Take 1 tablet (10 mg total) by mouth daily. 90 tablet 3  .  gabapentin (NEURONTIN) 600 MG tablet Take 600 mg by mouth 3 (three) times daily.     . hydroxychloroquine (PLAQUENIL) 200 MG tablet Take 200 mg by mouth 2 (two) times daily.     . isosorbide mononitrate (IMDUR) 30 MG 24 hr tablet Take 0.5 tablets (15 mg total) by mouth daily. 15 tablet 5  . latanoprost (XALATAN) 0.005 % ophthalmic solution Place 1 drop into both eyes at bedtime.     . metoprolol tartrate (LOPRESSOR) 25 MG tablet Take 0.5 tablets (12.5 mg total) by mouth 2 (two) times daily. 90 tablet 1  . Multiple Vitamin (MULTIVITAMIN WITH MINERALS) TABS tablet Take 1 tablet by mouth daily.    . Nintedanib (OFEV) 150 MG CAPS Take 1 capsule (150 mg total) by mouth daily. 90 capsule 1  . nitroGLYCERIN (NITROSTAT) 0.4 MG SL tablet Place 1 tablet (0.4 mg total) under the tongue every 5 (five) minutes as needed for chest pain. 25 tablet 0  . polyethylene glycol (MIRALAX / GLYCOLAX) packet Take 17 g by mouth daily as needed (constipation.).     Marland Kitchen predniSONE (DELTASONE) 5 MG tablet Take 1 tablet (5 mg total) by mouth 2 (two) times daily with a meal. RESUME AFTER COMPLETING THE PREDNISONE TAPER ORDERED SEPARATELY    . zolpidem (AMBIEN) 10 MG tablet Take 1 tablet (10 mg total) by mouth at bedtime as needed for sleep. 10 tablet 0   Current Facility-Administered Medications  Medication Dose Route Frequency Provider Last Rate Last Admin  . nitroGLYCERIN (NITROSTAT) SL tablet 0.4 mg  0.4 mg Sublingual Q5 min PRN Kathyrn Drown D, NP         Allergies:   Remicade [infliximab], Sulfa antibiotics, Actonel [risedronate sodium], Fosamax [alendronate sodium], Procardia [nifedipine], Elemental sulfur, and Wellbutrin [bupropion]   Social History:  The patient  reports that she has never smoked. She has never used smokeless tobacco. She reports that she does not drink alcohol and does not use drugs.   Family History:   family history includes Bladder Cancer in her father; COPD in her father; Cerebral palsy in her  sister; Endometrial cancer (age of onset: 1) in her mother; Hypertension in her mother; Osteoporosis in her mother.    Review of Systems: Review of Systems  HENT: Negative.   Respiratory: Negative.   Cardiovascular: Negative.   Gastrointestinal: Negative.   Musculoskeletal: Positive for falls.       Leg weakness  Neurological: Negative.   Psychiatric/Behavioral: Negative.   All other systems reviewed and are negative.   PHYSICAL EXAM: VS:  BP 130/76 (BP Location: Right Arm, Patient Position: Sitting, Cuff Size: Normal)   Pulse 68   Ht 4' 11.5" (1.511 m)   Wt 155 lb (70.3 kg)   LMP  (LMP Unknown)   SpO2 96%   BMI 30.78 kg/m  , BMI Body mass index is 30.78 kg/m. Constitutional:  oriented to person, place, and time. No distress.  HENT:  Head: Grossly normal Eyes:  no discharge. No scleral  icterus.  Neck: No JVD, no carotid bruits  Cardiovascular: Regular rate and rhythm, no murmurs appreciated Pulmonary/Chest: Clear to auscultation bilaterally, no wheezes or rails Abdominal: Soft.  no distension.  no tenderness.  Musculoskeletal: Normal range of motion Neurological:  normal muscle tone. Coordination normal. No atrophy Skin: Skin warm and dry Psychiatric: normal affect, pleasant   Recent Labs: 11/22/2020: Hemoglobin 10.7; Platelets 340 01/18/2021: ALT 19 01/25/2021: BUN 15; Creatinine, Ser 0.87; Potassium 4.5; Sodium 130    Lipid Panel No results found for: CHOL, HDL, LDLCALC, TRIG    Wt Readings from Last 3 Encounters:  03/22/21 155 lb (70.3 kg)  01/18/21 155 lb (70.3 kg)  11/27/20 155 lb (70.3 kg)     ASSESSMENT AND PLAN:  Shortness of breath interstitial fibrosis, not on oxygen Breathing stable after LAD stent, less edema  Idiopathic interstitial fibrosis of lung syndrome (HCC) Followed by pulmonary, not on oxygen Completed right heart cath  Scleroderma [facility]) Followed by rheumatology On chronic prednisone  Raynaud's disease without gangrene On  chronic prednisone, some wound healing issues which is chronic  [name]  Stent to LAD Cholesterol is at goal on the current lipid regimen. No changes to the medications were made. A1C 6  Leg swelling Not on lasix, only PRN   Total encounter time more than 25 minutes  Greater than 50% was spent in counseling and coordination of care with the patient     No orders of the defined types were placed in this encounter.    Signed, Esmond Plants, M.D., Ph.D. 03/22/2021  [place], [place]

## 2021-03-22 ENCOUNTER — Encounter: Payer: Self-pay | Admitting: Cardiovascular Disease

## 2021-03-22 ENCOUNTER — Other Ambulatory Visit: Payer: Self-pay

## 2021-03-22 ENCOUNTER — Ambulatory Visit (INDEPENDENT_AMBULATORY_CARE_PROVIDER_SITE_OTHER): Payer: Medicare Other | Admitting: Cardiovascular Disease

## 2021-03-22 VITALS — BP 130/76 | HR 68 | Ht 59.5 in | Wt 155.0 lb

## 2021-03-22 DIAGNOSIS — E782 Mixed hyperlipidemia: Secondary | ICD-10-CM

## 2021-03-22 DIAGNOSIS — I25118 Atherosclerotic heart disease of native coronary artery with other forms of angina pectoris: Secondary | ICD-10-CM | POA: Diagnosis not present

## 2021-03-22 DIAGNOSIS — R0602 Shortness of breath: Secondary | ICD-10-CM

## 2021-03-22 DIAGNOSIS — I6523 Occlusion and stenosis of bilateral carotid arteries: Secondary | ICD-10-CM

## 2021-03-22 DIAGNOSIS — I5031 Acute diastolic (congestive) heart failure: Secondary | ICD-10-CM

## 2021-03-22 DIAGNOSIS — M349 Systemic sclerosis, unspecified: Secondary | ICD-10-CM

## 2021-03-22 DIAGNOSIS — J8410 Pulmonary fibrosis, unspecified: Secondary | ICD-10-CM

## 2021-03-22 NOTE — Patient Instructions (Signed)
Medication Instructions:  No changes  If you need a refill on your cardiac medications before your next appointment, please call your pharmacy.    Lab work: No new labs needed   If you have labs (blood work) drawn today and your tests are completely normal, you will receive your results only by: . MyChart Message (if you have MyChart) OR . A paper copy in the mail If you have any lab test that is abnormal or we need to change your treatment, we will call you to review the results.   Testing/Procedures: No new testing needed   Follow-Up: At [facility], you and your health needs are our priority.  As part of our continuing mission to provide you with exceptional heart care, we have created designated Provider Care Teams.  These Care Teams include your primary Cardiologist (physician) and Advanced Practice Providers (APPs -  Physician Assistants and Nurse Practitioners) who all work together to provide you with the care you need, when you need it.  . You will need a follow up appointment in 6 months  . Providers on your designated Care Team:   . Christopher Berge, NP . Ryan Dunn, PA-C . Jacquelyn Visser, PA-C  Any Other Special Instructions Will Be Listed Below (If Applicable).  COVID-19 Vaccine Information can be found at: [email or web address] For questions related to vaccine distribution or appointments, please email [email or web address] or call [phone].     

## 2021-04-04 ENCOUNTER — Ambulatory Visit: Payer: Medicare Other | Admitting: Occupational Therapy

## 2021-04-08 NOTE — Progress Notes (Signed)
Error

## 2021-04-15 ENCOUNTER — Other Ambulatory Visit: Payer: Self-pay | Admitting: Cardiovascular Disease

## 2021-04-15 NOTE — Telephone Encounter (Signed)
Rx request sent to pharmacy.  

## 2021-05-01 ENCOUNTER — Inpatient Hospital Stay: Payer: Medicare Other | Attending: Obstetrics and Gynecology | Admitting: Obstetrics and Gynecology

## 2021-05-01 ENCOUNTER — Other Ambulatory Visit: Payer: Self-pay

## 2021-05-01 VITALS — BP 126/62 | HR 60 | Temp 98.7°F | Resp 18 | Wt 152.9 lb

## 2021-05-01 DIAGNOSIS — K219 Gastro-esophageal reflux disease without esophagitis: Secondary | ICD-10-CM | POA: Insufficient documentation

## 2021-05-01 DIAGNOSIS — Z87412 Personal history of vulvar dysplasia: Secondary | ICD-10-CM

## 2021-05-01 DIAGNOSIS — A630 Anogenital (venereal) warts: Secondary | ICD-10-CM | POA: Diagnosis not present

## 2021-05-01 DIAGNOSIS — M5116 Intervertebral disc disorders with radiculopathy, lumbar region: Secondary | ICD-10-CM | POA: Diagnosis not present

## 2021-05-01 DIAGNOSIS — M349 Systemic sclerosis, unspecified: Secondary | ICD-10-CM | POA: Diagnosis not present

## 2021-05-01 DIAGNOSIS — L292 Pruritus vulvae: Secondary | ICD-10-CM

## 2021-05-01 DIAGNOSIS — M810 Age-related osteoporosis without current pathological fracture: Secondary | ICD-10-CM | POA: Diagnosis not present

## 2021-05-01 DIAGNOSIS — Z85820 Personal history of malignant melanoma of skin: Secondary | ICD-10-CM | POA: Insufficient documentation

## 2021-05-01 DIAGNOSIS — I110 Hypertensive heart disease with heart failure: Secondary | ICD-10-CM | POA: Insufficient documentation

## 2021-05-01 DIAGNOSIS — I340 Nonrheumatic mitral (valve) insufficiency: Secondary | ICD-10-CM | POA: Diagnosis not present

## 2021-05-01 DIAGNOSIS — M069 Rheumatoid arthritis, unspecified: Secondary | ICD-10-CM | POA: Diagnosis not present

## 2021-05-01 DIAGNOSIS — I25110 Atherosclerotic heart disease of native coronary artery with unstable angina pectoris: Secondary | ICD-10-CM | POA: Diagnosis not present

## 2021-05-01 DIAGNOSIS — R0602 Shortness of breath: Secondary | ICD-10-CM | POA: Diagnosis not present

## 2021-05-01 DIAGNOSIS — Z8616 Personal history of COVID-19: Secondary | ICD-10-CM | POA: Diagnosis not present

## 2021-05-01 DIAGNOSIS — Z9861 Coronary angioplasty status: Secondary | ICD-10-CM | POA: Insufficient documentation

## 2021-05-01 DIAGNOSIS — E785 Hyperlipidemia, unspecified: Secondary | ICD-10-CM | POA: Diagnosis not present

## 2021-05-01 DIAGNOSIS — N9089 Other specified noninflammatory disorders of vulva and perineum: Secondary | ICD-10-CM

## 2021-05-01 DIAGNOSIS — Z90710 Acquired absence of both cervix and uterus: Secondary | ICD-10-CM | POA: Insufficient documentation

## 2021-05-01 NOTE — Patient Instructions (Signed)
Vulva Biopsy, Care After This sheet gives you information about how to care for yourself after your procedure. Your doctor may also give you more specific instructions. If you have problems or questions, contact your doctor. What can I expect after the procedure? After the procedure, it is common to have:  Slight bleeding from the biopsy site.  Soreness at the biopsy site. Follow these instructions at home: Biopsy site care  Follow instructions from your doctor about how to take care of your biopsy site. Make sure you: ? Clean the area using water and mild soap two times a day or as told by your doctor. Gently pat the area dry. ? If you were prescribed an antibiotic ointment, apply it as told by your doctor. Do not stop using the antibiotic even if your condition gets better. ? Take a warm water bath (sitz bath) as needed to help with pain. A sitz bath is taken while you are sitting down. The water should only come up to your hips and cover your butt. ? Leave stitches (sutures), skin glue, or skin tape (adhesive) strips in place. They may need to stay in place for 2 weeks or longer. If tape strips get loose and curl up, you may trim the loose edges. Do not remove tape strips completely unless your doctor says it is okay. ? Check your biopsy area every day for signs of infection. Check for:  More redness, swelling, or pain.  More fluid or blood.  Warmth.  Pus or a bad smell. ? Do not rub the biopsy area after peeing (urinating).  Gently pat the area dry, or use a bottle filled with warm water (peri-bottle) to clean the area.  Gently wipe from front to back.   Lifestyle  Wear loose, cotton underwear.  Do not wear tight pants.  Do not use a tampon, douche, or put anything in your vagina for at least 1 week or until your doctor says it is okay.  Do not have sex for at least 1 week or until your doctor says it is okay.  Do not exercise until your doctor says it is okay.  Do not  swim or use a hot tub until your doctor says it is okay. You may shower or take a sitz bath. General instructions  Take over-the-counter and prescription medicines only as told by your doctor.  Use a sanitary pad until the bleeding stops.  Keep all follow-up visits as told by your doctor. This is important. Contact a doctor if:  You have more redness, swelling, or pain around your biopsy site.  You have more fluid or blood coming from your biopsy site.  Your biopsy site feels warm when you touch it.  Medicines do not help with your pain. Get help right away if:  You have a lot of bleeding from the vulva.  You have pus or a bad smell coming from your biopsy site.  You have a fever.  You have pain in the lower belly (abdomen). Summary  After the procedure, it is common to have slight bleeding and soreness at the biopsy site.  Follow all instructions as told by your doctor. Clean the area with water and mild soap. Do not rub. Pat the area dry.  Take sitz baths as needed. Leave any stitches in place.  Check your biopsy site for infection. Signs include more redness, swelling, pain, fluid, or blood, or feeling warm when you touch it.  Get help right away if you have  a lot of bleeding, a fever, pus or a bad smell, or pain in your lower belly. This information is not intended to replace advice given to you by your health care provider. Make sure you discuss any questions you have with your health care provider. Document Revised: 05/27/2018 Document Reviewed: 05/27/2018 Elsevier Patient Education  2021 [employer].

## 2021-05-01 NOTE — Progress Notes (Signed)
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 female s/p TAH, seen in consultation from Dr. Kenton Kingfisher for [place]. Dr. Bary Castilla initially referred Erin [name] to Dr. Kenton Kingfisher. She returns to clinic today for continued surveillance.    08/31/2019 DIAGNOSIS:  A. VULVA, LEFT; BIOPSY:  - LOW GRADE SQUAMOUS INTRAEPITHELIAL LESION (LSIL / VIN1).  - NEGATIVE FOR MALIGNANCY.   At that point she was mildly symptomatic and recommendations were for surveillance. She presents today with complaints of vulvar irritation.    Gynecologic Oncology History ALKA [name] is a pleasant female s/p TAH who is seen in consultation from Dr. Kenton Kingfisher for [place]. Dr. Bary Castilla initially referred Ms. Dalzell to Dr. Kenton Kingfisher.   The patient underwent left leg wide excision with sentinel node biopsy for melanoma and excision left labial nodule on 11/19/2016. Based on pathology report the vulvar nodule was 1 x 0.8 cm and the entire specimen removed was 1.5 x 0.9 x 0.3 cm. She complains of persistent irritation/itching and has been using clobesetrol ointment for years.   DIAGNOSIS:  A. SKIN AND SOFT TISSUE, LEFT THIGH; EXCISION:  - NEGATIVE FOR RESIDUAL MELANOMA.  - BIOPSY SITE CHANGES.  - ALL MARGINS APPEAR CLEAR.   B. SENTINEL LYMPH NODE #1, LEFT INGUINAL; EXCISION:  - NEGATIVE FOR MALIGNANCY BY IMMUNOHISTOCHEMISTRY FOR SOX10.   C. LEFT LABIAL NODULE; EXCISION:  - HIGH-GRADE SQUAMOUS INTRAEPITHELIAL LESION (HSIL, VIN 3).   Comment:  In part C, there is HSIL 0.5 mm from a lateral margin. HSIL is present in one tip section, so this margin is very close also. Complete gynecologic exam for other HPV-associated lesions and close follow-up are advised.  She recently had colonoscopy and EGD on 01/20/2017; pathology colonic polyps and GE junction lesion inflamed granulation tissue  Barrett's esophagus can not be excluded. Noted to have esophageal lesion and endoscopic ultrasound ordered on 01/29/2017.  01/28/2017 she had vulvar colposcopy and biopsy that demonstrated  DIAGNOSIS:  A. VULVA, LEFT, 5:00:  - VULVAR TISSUE WITH SCANT INFLAMMATION.  - NEGATIVE FOR DYSPLASIA AND MALIGNANCY.  - MULTIPLE DEEPER LEVELS WERE EXAMINED.  Of note she does not have a history of abnormal Pap, genital warts, or other gynecologic related issues.     Problem List: Patient Active Problem List   Diagnosis Date Noted  . Angina pectoris ([place]) 11/21/2020  . Unstable angina ([place]) 10/30/2020  . Bilateral carotid artery stenosis 10/30/2020  . Colon polyp 08/31/2019  . [name] S/P percutaneous coronary angioplasty 08/31/2019  . Degenerative disc disease, lumbar 08/31/2019  . Dysphagia 08/31/2019  . Environmental allergies 08/31/2019  . Esophageal dysmotility 08/31/2019  . Fibrocystic breast disease 08/31/2019  . Mitral regurgitation 08/31/2019  . Acute respiratory disease due to COVID-19 virus 05/28/2019  . Food in esophagus causing other injury, initial encounter 04/21/2019  . Gastro-esophageal reflux disease with esophagitis 04/21/2019  . Gastric nodule 04/21/2019  . Varicose veins of leg with swelling, bilateral 06/25/2018  . Acute diastolic CHF (congestive heart failure) ([place]) 03/18/2018  . Lower extremity edema 03/18/2018  . Age-related osteoporosis with current pathological fracture 03/04/2018  . Symptomatic anemia 02/28/2018  . S/P hip hemiarthroplasty 02/26/2018  . Hip fracture ([place]) 02/22/2018  . Lumbar radiculopathy 01/20/2018  . Polyneuropathy 01/20/2018  . Chronic insomnia 01/15/2018  . Hyperlipidemia 10/26/2017  . Coronary artery disease of native artery of native heart with stable angina  pectoris ([place]) 10/24/2017  . Vulvar irritation 09/02/2017  . 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) 12/16/2016   . SOB (shortness of breath) 12/15/2016  . Idiopathic interstitial fibrosis of lung syndrome ([place]) 12/15/2016  . Raynaud's disease 12/15/2016  . Melanoma of skin ([place]) 11/11/2016  . Lesion of labia 11/11/2016  . HTN, goal below 140/90 08/29/2014  . Encounter for long-term current use of medication 03/30/2014  . Scleroderma ([place]) 03/15/2014  . Rheumatoid arthritis ([place]) 03/15/2014  . Osteopenia 03/15/2014    Past Medical History: Past Medical History:  Diagnosis Date  . Anemia    h/o  . Cancer ([place])    melanoma x 3the last one was of her leg and surgeon removed it  . Colon polyp   . Complication of anesthesia    PT STATED THAT WITH JOINT SURGERY THAT SHE WAS GIVEN SOME TYPE OF ANESTHESIA THAT MADE HER HALLUCINATE   . Coronary artery calcification   . Coronary artery disease   . DDD (degenerative disc disease), lumbar   . Diverticulosis 01/20/2017  . Dysphagia   . Dyspnea    with exertion-unable to walk a mile without getting sob- dr sparks set pt up to see Dr Rockey Situ after 11-19-16 surgery  . Environmental allergies   . Esophageal dysmotility   . Fibrocystic breast disease   . GERD (gastroesophageal reflux disease)   . Heart murmur    asymptomatic  . Hyperlipidemia    unspecified  . Hypertension   . Mitral regurgitation   . Monilial esophagitis ([place]) 01/20/2017  . Obstipation   . Osteopenia   . Peptic ulcer disease   . Pulmonary fibrosis ([place])    per Dr Raul Del  . RA (rheumatoid arthritis) ([place])   . Raynaud's disease   . Redundant colon 01/20/2017  . Scleroderma ([place])   . Telangiectasia of colon   . Tubular adenoma of colon 01/20/2017   unspecified  . Vulvar dysplasia     Past Surgical History: Past Surgical History:  Procedure Laterality Date  . ABDOMINAL HYSTERECTOMY    . BREAST CYST EXCISION Left 20+ years ago   No scar visible  . BREAST LUMPECTOMY Right   . CARDIAC CATHETERIZATION    . CARPAL TUNNEL RELEASE Bilateral   . COLON SURGERY     colon  polyp   . COLONOSCOPY  09/30/2011   tubular adenoma rtm  . COLONOSCOPY     05/02/2003, 04/10/2000  . COLONOSCOPY WITH PROPOFOL N/A 01/20/2017   Procedure: COLONOSCOPY WITH PROPOFOL;  Surgeon: Lollie Sails, MD;  Location[facility] ENDOSCOPY;  Service: Endoscopy;  Laterality: N/A;  . COLONOSCOPY WITH PROPOFOL N/A 09/17/2020   Procedure: COLONOSCOPY WITH PROPOFOL;  Surgeon: Lesly Rubenstein, MD;  Location: [facility] ENDOSCOPY;  Service: Endoscopy;  Laterality: N/A;  . CORONARY ATHERECTOMY N/A 11/21/2020   Procedure: CORONARY ATHERECTOMY;  Surgeon: Wellington Hampshire, MD;  Location: [place] CV LAB;  Service: Cardiovascular;  Laterality: N/A;  . CORONARY STENT INTERVENTION  11/21/2020  . CORONARY STENT INTERVENTION N/A 11/21/2020   Procedure: CORONARY STENT INTERVENTION;  Surgeon: Wellington Hampshire, MD;  Location: [place] CV LAB;  Service: Cardiovascular;  Laterality: N/A;  . ESOPHAGOGASTRODUODENOSCOPY    . ESOPHAGOGASTRODUODENOSCOPY     09/30/2011, 04/10/2000 , no repeat rtm  . ESOPHAGOGASTRODUODENOSCOPY N/A 03/02/2018   Procedure: ESOPHAGOGASTRODUODENOSCOPY (EGD);  Surgeon: Virgel Manifold, MD;  Location: [facility] ENDOSCOPY;  Service: Endoscopy;  Laterality: N/A;  . ESOPHAGOGASTRODUODENOSCOPY (EGD) WITH PROPOFOL  N/A 01/20/2017   Procedure: ESOPHAGOGASTRODUODENOSCOPY (EGD) WITH PROPOFOL;  Surgeon: Lollie Sails, MD;  Location: [facility] ENDOSCOPY;  Service: Endoscopy;  Laterality: N/A;  . ESOPHAGOGASTRODUODENOSCOPY (EGD) WITH PROPOFOL N/A 09/17/2020   Procedure: ESOPHAGOGASTRODUODENOSCOPY (EGD) WITH PROPOFOL;  Surgeon: Lesly Rubenstein, MD;  Location: [facility] ENDOSCOPY;  Service: Endoscopy;  Laterality: N/A;  . EXCISION HYDRADENITIS LABIA Left 11/19/2016   Procedure: EXCISION LABIAL MASS;  Surgeon: Robert Bellow, MD;  Location: [facility] ORS;  Service: General;  Laterality: Left;  . EYE SURGERY Bilateral    cataracts  . HEEL SPUR EXCISION    . HIP ARTHROPLASTY Left 02/23/2018   Procedure:  ARTHROPLASTY BIPOLAR HIP (HEMIARTHROPLASTY);  Surgeon: Thornton Park, MD;  Location: [facility] ORS;  Service: Orthopedics;  Laterality: Left;  . KYPHOPLASTY N/A 04/26/2019   Procedure: KYPHOPLASTY T7;  Surgeon: Hessie Knows, MD;  Location: [facility] ORS;  Service: Orthopedics;  Laterality: N/A;  . KYPHOPLASTY N/A 05/17/2019   Procedure: KYPHOPLASTY T8;  Surgeon: Hessie Knows, MD;  Location: [facility] ORS;  Service: Orthopedics;  Laterality: N/A;  . KYPHOPLASTY N/A 07/26/2019   Procedure: T-9 KYPHOPLASTY;  Surgeon: Hessie Knows, MD;  Location: [facility] ORS;  Service: Orthopedics;  Laterality: N/A;  . MASS EXCISION Left 11/19/2016   Procedure: EXCISION LEFT THIGH MELANOMA;  Surgeon: Robert Bellow, MD;  Location: [facility] ORS;  Service: General;  Laterality: Left;  . RIGHT/LEFT HEART CATH AND CORONARY ANGIOGRAPHY N/A 11/15/2020   Procedure: RIGHT/LEFT HEART CATH AND CORONARY ANGIOGRAPHY poss intervention;  Surgeon: Minna Merritts, MD;  Location: [place] CV LAB;  Service: Cardiovascular;  Laterality: N/A;  . SENTINEL NODE BIOPSY Left 11/19/2016   Procedure: INGUINAL SENTINEL NODE BIOPSY;  Surgeon: Robert Bellow, MD;  Location: [facility] ORS;  Service: General;  Laterality: Left;  . UPPER ESOPHAGEAL ENDOSCOPIC ULTRASOUND (EUS) N/A 01/29/2017   Procedure: UPPER ESOPHAGEAL ENDOSCOPIC ULTRASOUND (EUS);  Surgeon: Holly Bodily, MD;  Location: [facility] ENDOSCOPY;  Service: Endoscopy;  Laterality: N/A;    Past Gynecologic History:  As per HPI  OB History:  OB History  Gravida Para Term Preterm AB Living  3 3          SAB IAB Ectopic Multiple Live Births               # Outcome Date GA Lbr Len/2nd Weight Sex Delivery Anes PTL Lv  3 Para           2 Para           1 Para             Family History: Family History  Problem Relation Age of Onset  . Endometrial cancer Mother 16  . Hypertension Mother   . Osteoporosis Mother   . Bladder Cancer Father   . COPD Father   . Cerebral palsy Sister      Social History: Social History   Socioeconomic History  . Marital status: Widowed    Spouse name: Not on file  . Number of children: Not on file  . Years of education: Not on file  . Highest education level: Not on file  Occupational History  . Occupation: retired  Tobacco Use  . Smoking status: Never Smoker  . Smokeless tobacco: Never Used  Vaping Use  . Vaping Use: Never used  Substance and Sexual Activity  . Alcohol use: No  . Drug use: No  . Sexual activity: Not on file  Other Topics Concern  . Not on file  Social History Narrative  .  Not on file   Social Determinants of Health   Financial Resource Strain: Not on file  Food Insecurity: Not on file  Transportation Needs: Not on file  Physical Activity: Not on file  Stress: Not on file  Social Connections: Not on file  Intimate Partner Violence: Not on file    Allergies: Allergies  Allergen Reactions  . Remicade [Infliximab] Shortness Of Breath and Itching  . Sulfa Antibiotics Other (See Comments)    Other Reaction: tounge cracked  . Actonel [Risedronate Sodium] Other (See Comments)  . Fosamax [Alendronate Sodium]     GI Bleed   . Procardia [Nifedipine] Hives  . Elemental Sulfur Swelling  . Wellbutrin [Bupropion] Anxiety     Review of Systems General:  no complaints Skin: no complaints Eyes: no complaints HEENT: no complaints Breasts: no complaints Pulmonary: sob; hx of covid-19 infection Cardiac: no complaints Gastrointestinal: no complaints Genitourinary/Sexual: as noted in HPI Ob/Gyn: no complaints Musculoskeletal: back pain Hematology: no complaints Neurologic/Psych: depression    Objective:  Physical Examination:  BP 126/62   Pulse 60   Temp 98.7 F (37.1 C)   Resp 18   Wt 152 lb 14.4 oz (69.4 kg)   LMP  (LMP Unknown)   SpO2 90%   BMI 30.37 kg/m    GENERAL: Elderly appearing female in no acute distress EXTREMITIES:  No peripheral edema.   NEURO:  Nonfocal. Well oriented.   Appropriate affect.  Pelvic:Chaperoned by RN EGBUS: atrophic; 3 cm oblique incision of the left vulva; There are no gross lesions noted.  Cervix: absent  Vagina: no lesions, no discharge or bleeding Uterus: absent  PROCEDURE: The risks and benefits of the procedure were reviewed and informed consent obtained. Time out was performed. The patient received pre-procedure teaching and expressed understanding. The post-procedure instructions were reviewed with the patient and she expressed understanding. The patient does not have any barriers to learning.  Colposcopy of the vulvar was performed after application os acetic acid. The findings revealed no AWE or abnormal vasculature.  Biopsy was performed at the site where patient was most symptomatic on the lower left labia ~4 o'clock above and medial to the scar. Procedure completed without complications. Hemostasis adequate with AgNO3. The patient tolerated the procedure well.   Post-procedure evaluation the patient was stable without complaints.   Assessment:  AVIAN GREENAWALT is a 75 y.o. female diagnosed with vulvar dysplasia (VIN3) s/p excisional biopsy 12/17 with close, but negative, margins.  Follow up biopsy 2/18 was done due to irritation and was negative. Colposcopy 09/02/2018 reassuring. Vulvar irritation 9/20 and biopsy proven VIN1, managed conservatively. Persistent vulvar irritation. Bx performed 05/01/2021.    No itching or irritation now and vulvar exam completely normal.    Medical co-morbidities complicating care: HTN, [name], GERD, h/o melanoma.  Plan:   Problem List Items Addressed This Visit      Other   Vulvar irritation - Primary     Follow up biopsy and treat accordingly. If negative for inflammation, LS, or VIN then return to clinic in 12 months based on patient preference or sooner if any vulvar symptoms or lesions noted.   The patient's diagnosis, an outline of the further diagnostic and laboratory studies which will be  required, the recommendation, and alternatives were discussed.  All questions were answered to the patient's satisfaction.  Curties Conigliaro Gaetana Michaelis, MD   CC:  Referring Providers: Barnett Applebaum, MD Hervey Ard, MD   PCP: Idelle Crouch, MD [address]  [place], [place] [address] [phone]

## 2021-05-03 LAB — SURGICAL PATHOLOGY

## 2021-05-07 ENCOUNTER — Telehealth: Payer: Self-pay

## 2021-05-07 ENCOUNTER — Other Ambulatory Visit: Payer: Self-pay

## 2021-05-07 MED ORDER — CLOBETASOL PROPIONATE 0.05 % EX OINT: TOPICAL_OINTMENT | CUTANEOUS | 2 refills | Status: DC

## 2021-05-07 NOTE — Telephone Encounter (Addendum)
Dr. Theora Gianotti has reviewed biopsy. Called and went over results of vulvar biopsy with Erin [name]. Educated on Clobetasol. She has used this before and states she is familiar with it. Went over application instructions and copy mailed to home address. 12 week follow up appointment made.

## 2021-05-07 NOTE — Progress Notes (Signed)
error 

## 2021-07-10 ENCOUNTER — Other Ambulatory Visit: Payer: Self-pay | Admitting: Family

## 2021-08-07 ENCOUNTER — Other Ambulatory Visit: Payer: Self-pay | Admitting: Family

## 2021-08-07 DIAGNOSIS — I25118 Atherosclerotic heart disease of native coronary artery with other forms of angina pectoris: Secondary | ICD-10-CM

## 2021-08-07 DIAGNOSIS — E785 Hyperlipidemia, unspecified: Secondary | ICD-10-CM

## 2021-08-07 NOTE — Telephone Encounter (Signed)
Rx(s) sent to pharmacy electronically.  

## 2021-08-08 ENCOUNTER — Other Ambulatory Visit
Admission: RE | Admit: 2021-08-08 | Discharge: 2021-08-08 | Disposition: A | Discharge status: Home / Self Care | Payer: Medicare Other | Source: Ambulatory Visit | Attending: Sports Medicine | Admitting: Sports Medicine

## 2021-08-08 DIAGNOSIS — G8929 Other chronic pain: Secondary | ICD-10-CM | POA: Diagnosis present

## 2021-08-08 DIAGNOSIS — M25461 Effusion, right knee: Secondary | ICD-10-CM | POA: Insufficient documentation

## 2021-08-08 DIAGNOSIS — M1711 Unilateral primary osteoarthritis, right knee: Secondary | ICD-10-CM | POA: Diagnosis present

## 2021-08-08 DIAGNOSIS — M25561 Pain in right knee: Secondary | ICD-10-CM | POA: Diagnosis present

## 2021-08-08 LAB — SYNOVIAL CELL COUNT + DIFF, W/ CRYSTALS
Crystals, Fluid: NONE SEEN
Eosinophils-Synovial: UNDETERMINED %
Lymphocytes-Synovial Fld: UNDETERMINED %
Monocyte-Macrophage-Synovial Fluid: UNDETERMINED %
Neutrophil, Synovial: UNDETERMINED %
WBC, Synovial: UNDETERMINED /mm3 (ref 0–200)

## 2021-08-14 ENCOUNTER — Ambulatory Visit: Payer: Medicare Other

## 2021-09-23 ENCOUNTER — Encounter: Payer: Self-pay | Admitting: Cardiovascular Disease

## 2021-09-23 ENCOUNTER — Other Ambulatory Visit: Payer: Self-pay

## 2021-09-23 ENCOUNTER — Ambulatory Visit (INDEPENDENT_AMBULATORY_CARE_PROVIDER_SITE_OTHER): Payer: Medicare Other | Admitting: Cardiovascular Disease

## 2021-09-23 VITALS — BP 120/64 | HR 81 | Ht 59.5 in | Wt 153.5 lb

## 2021-09-23 DIAGNOSIS — I5031 Acute diastolic (congestive) heart failure: Secondary | ICD-10-CM | POA: Diagnosis not present

## 2021-09-23 DIAGNOSIS — E785 Hyperlipidemia, unspecified: Secondary | ICD-10-CM

## 2021-09-23 DIAGNOSIS — J84112 Idiopathic pulmonary fibrosis: Secondary | ICD-10-CM

## 2021-09-23 DIAGNOSIS — J8410 Pulmonary fibrosis, unspecified: Secondary | ICD-10-CM

## 2021-09-23 DIAGNOSIS — E782 Mixed hyperlipidemia: Secondary | ICD-10-CM

## 2021-09-23 DIAGNOSIS — I25118 Atherosclerotic heart disease of native coronary artery with other forms of angina pectoris: Secondary | ICD-10-CM

## 2021-09-23 DIAGNOSIS — I6523 Occlusion and stenosis of bilateral carotid arteries: Secondary | ICD-10-CM | POA: Diagnosis not present

## 2021-09-23 DIAGNOSIS — M349 Systemic sclerosis, unspecified: Secondary | ICD-10-CM

## 2021-09-23 DIAGNOSIS — R0602 Shortness of breath: Secondary | ICD-10-CM

## 2021-09-23 MED ORDER — HYDROXYCHLOROQUINE SULFATE 200 MG PO TABS: 200.0000 mg | ORAL_TABLET | Freq: Two times a day (BID) | ORAL | 3 refills | Status: AC

## 2021-09-23 MED ORDER — ISOSORBIDE MONONITRATE ER 30 MG PO TB24: 15.0000 mg | ORAL_TABLET | Freq: Every day | ORAL | 3 refills | Status: AC

## 2021-09-23 MED ORDER — METOPROLOL TARTRATE 25 MG PO TABS: 12.5000 mg | ORAL_TABLET | Freq: Two times a day (BID) | ORAL | 3 refills | Status: AC

## 2021-09-23 MED ORDER — CLOPIDOGREL BISULFATE 75 MG PO TABS: 75.0000 mg | ORAL_TABLET | Freq: Every day | ORAL | 3 refills | Status: AC

## 2021-09-23 MED ORDER — FUROSEMIDE 20 MG PO TABS: 20.0000 mg | ORAL_TABLET | ORAL | 3 refills | Status: AC

## 2021-09-23 MED ORDER — ATORVASTATIN CALCIUM 40 MG PO TABS
40.0000 mg | ORAL_TABLET | Freq: Every day | ORAL | 3 refills | Status: AC
Start: 2021-09-23 — End: ?

## 2021-09-23 MED ORDER — EZETIMIBE 10 MG PO TABS: 10.0000 mg | ORAL_TABLET | Freq: Every day | ORAL | 3 refills | Status: AC

## 2021-09-23 MED ORDER — AMLODIPINE BESYLATE 5 MG PO TABS: 5.0000 mg | ORAL_TABLET | Freq: Every day | ORAL | 3 refills | Status: AC

## 2021-09-23 NOTE — Patient Instructions (Addendum)
Medication Instructions:  Lasix 20 mg every other day May take extra lasix as needed For swelling or shortness of breath Increase sodium (salt) intake Watch the free water  If you need a refill on your cardiac medications before your next appointment, please call your pharmacy.   Lab work:  Testing/Procedures: No new testing needed  Follow-Up: At [facility], you and your health needs are our priority.  As part of our continuing mission to provide you with exceptional heart care, we have created designated Provider Care Teams.  These Care Teams include your primary Cardiologist (physician) and Advanced Practice Providers (APPs -  Physician Assistants and Nurse Practitioners) who all work together to provide you with the care you need, when you need it.  You will need a follow up appointment in 6 months  Providers on your designated Care Team:   Murray Hodgkins, NP Christell Faith, PA-C Marrianne Mood, PA-C [place], [place]  COVID-19 Vaccine Information can be found at: [email or web address] For questions related to vaccine distribution or appointments, please email [email or web address] or call 6042968559.

## 2021-09-23 NOTE — Progress Notes (Signed)
Cardiology Office Note  Date:  09/23/2021   ID:  Erin [name], DOB Feb 10, 1946, MRN [id number]  PCP:  Idelle Crouch, MD   Chief Complaint  Patient presents with   6 month follow up     Patient c/o LE edema & shortness of breath. Medications reviewed by the patient verbally.     HPI:  Erin [name] is a very pleasant 75 year old woman with history of  [name], Seen on CT scan chest October 2016 mid to distal LAD region Complicated scleroderma,  raynauds disease (toes, hands), arthritis,  chronic interstitial lung disease at the bases seen on CT scan chest x-ray,  who presents for worsening shortness of breath on exertion, [name]  Last seen in clinic by myself April 2022  Cortisone right knee, over one week ago Reports having cartilage injection Presenting today in a wheelchair  Reports she was told to stop Lasix by primary care, unclear reasons Possibly for low sodium Prior sodium 129, most recently 131 Last Friday several days ago with worsening ankle swelling Had significant pain from the swelling Took 2 Lasix for 2 days in a row then 1 the third day, ankle swelling resolved Feels back to her baseline, does not know whether to continue her Lasix daily as she was doing before  Denies any anginal symptoms  EKG personally reviewed by myself on todays visit Normal sinus rhythm rate 81 bpm no significant ST-T wave changes, poor R wave progression to the anterior precordial leads  .The past medical history reviewed Cardiac catheterization performed November 15, 2020 for unstable angina Severe single-vessel disease mid LAD, heavily calcified at the takeoff of the diagonal branches x2.  Estimated to be 80%.  Normal ejection fraction, normal right heart pressures  Underwent orbital atherectomy and PCI in [place] mid LAD  Last seen in our clinic January 18, 2021   Labs reviewed A1C 6.1 LDL 66  Continues to have severe complications from scleroderma "cant do much with  hands" Trouble turn on car  Echo 10/2018  Left ventricle: mild LVH. Systolic function was normal.   The estimated ejection fraction was in the range of 60% to 65%.   Left ventricular diastolic function parameters were normal. - Ascending aorta: The ascending aorta was mildly dilated.  Systolic pressure was mildly increased, in   the range of 35 mm Hg to 40 mm Hg.  Several recent hospitalizations March 2019 Had fall with left femoral fracture Surgical repair, subsequent anemia Fall was mechanical, down some steps  Readmitted to the hospital several days later with worsening anemia At EGD showing small region of erosion Started on iron,   Previous CXR showing chronic interstitial changes are present at the lung bases.  CT scan results and images  mid to distal LAD coronary calcifications No significant thoracic descending  or ascending aortic atherosclerosis It does show fibrosis at the bases of the lungs bilaterally perhaps worse on the left than the right   PMH:   has a past medical history of Anemia, Cancer ([place]), Colon polyp, Complication of anesthesia, Coronary artery calcification, Coronary artery disease, DDD (degenerative disc disease), lumbar, Diverticulosis (01/20/2017), Dysphagia, Dyspnea, Environmental allergies, Esophageal dysmotility, Fibrocystic breast disease, GERD (gastroesophageal reflux disease), Heart murmur, Hyperlipidemia, Hypertension, Mitral regurgitation, Monilial esophagitis ([place]) (01/20/2017), Obstipation, Osteopenia, Peptic ulcer disease, Pulmonary fibrosis ([place]), RA (rheumatoid arthritis) ([place]), Raynaud's disease, Redundant colon (01/20/2017), Scleroderma ([place]), Telangiectasia of colon, Tubular adenoma of colon (01/20/2017), and Vulvar dysplasia.  PSH:    Past Surgical History:  Procedure Laterality  Date   ABDOMINAL HYSTERECTOMY     BREAST CYST EXCISION Left 20+ years ago   No scar visible   BREAST LUMPECTOMY Right    CARDIAC CATHETERIZATION     CARPAL  TUNNEL RELEASE Bilateral    COLON SURGERY     colon polyp    COLONOSCOPY  09/30/2011   tubular adenoma rtm   COLONOSCOPY     05/02/2003, 04/10/2000   COLONOSCOPY WITH PROPOFOL N/A 01/20/2017   Procedure: COLONOSCOPY WITH PROPOFOL;  Surgeon: Lollie Sails, MD;  Location: [facility] ENDOSCOPY;  Service: Endoscopy;  Laterality: N/A;   COLONOSCOPY WITH PROPOFOL N/A 09/17/2020   Procedure: COLONOSCOPY WITH PROPOFOL;  Surgeon: Lesly Rubenstein, MD;  Location: [facility] ENDOSCOPY;  Service: Endoscopy;  Laterality: N/A;   CORONARY ATHERECTOMY N/A 11/21/2020   Procedure: CORONARY ATHERECTOMY;  Surgeon: Wellington Hampshire, MD;  Location: [place] CV LAB;  Service: Cardiovascular;  Laterality: N/A;   CORONARY STENT INTERVENTION  11/21/2020   CORONARY STENT INTERVENTION N/A 11/21/2020   Procedure: CORONARY STENT INTERVENTION;  Surgeon: Wellington Hampshire, MD;  Location: [place] [facility] LAB;  Service: Cardiovascular;  Laterality: N/A;   ESOPHAGOGASTRODUODENOSCOPY     ESOPHAGOGASTRODUODENOSCOPY     09/30/2011, 04/10/2000 , no repeat rtm   ESOPHAGOGASTRODUODENOSCOPY N/A 03/02/2018   Procedure: ESOPHAGOGASTRODUODENOSCOPY (EGD);  Surgeon: Virgel Manifold, MD;  Location: [facility] ENDOSCOPY;  Service: Endoscopy;  Laterality: N/A;   ESOPHAGOGASTRODUODENOSCOPY (EGD) WITH PROPOFOL N/A 01/20/2017   Procedure: ESOPHAGOGASTRODUODENOSCOPY (EGD) WITH PROPOFOL;  Surgeon: Lollie Sails, MD;  Location: [facility] ENDOSCOPY;  Service: Endoscopy;  Laterality: N/A;   ESOPHAGOGASTRODUODENOSCOPY (EGD) WITH PROPOFOL N/A 09/17/2020   Procedure: ESOPHAGOGASTRODUODENOSCOPY (EGD) WITH PROPOFOL;  Surgeon: Lesly Rubenstein, MD;  Location: [facility] ENDOSCOPY;  Service: Endoscopy;  Laterality: N/A;   EXCISION HYDRADENITIS LABIA Left 11/19/2016   Procedure: EXCISION LABIAL MASS;  Surgeon: Robert Bellow, MD;  Location: [facility] ORS;  Service: General;  Laterality: Left;   EYE SURGERY Bilateral    cataracts   HEEL SPUR EXCISION     HIP  ARTHROPLASTY Left 02/23/2018   Procedure: ARTHROPLASTY BIPOLAR HIP (HEMIARTHROPLASTY);  Surgeon: Thornton Park, MD;  Location: [facility] ORS;  Service: Orthopedics;  Laterality: Left;   KYPHOPLASTY N/A 04/26/2019   Procedure: KYPHOPLASTY T7;  Surgeon: Hessie Knows, MD;  Location: [facility] ORS;  Service: Orthopedics;  Laterality: N/A;   KYPHOPLASTY N/A 05/17/2019   Procedure: KYPHOPLASTY T8;  Surgeon: Hessie Knows, MD;  Location: [facility] ORS;  Service: Orthopedics;  Laterality: N/A;   KYPHOPLASTY N/A 07/26/2019   Procedure: T-9 KYPHOPLASTY;  Surgeon: Hessie Knows, MD;  Location: [facility] ORS;  Service: Orthopedics;  Laterality: N/A;   MASS EXCISION Left 11/19/2016   Procedure: EXCISION LEFT THIGH MELANOMA;  Surgeon: Robert Bellow, MD;  Location: [facility] ORS;  Service: General;  Laterality: Left;   RIGHT/LEFT HEART CATH AND CORONARY ANGIOGRAPHY N/A 11/15/2020   Procedure: RIGHT/LEFT HEART CATH AND CORONARY ANGIOGRAPHY poss intervention;  Surgeon: Minna Merritts, MD;  Location: [place] [facility] LAB;  Service: Cardiovascular;  Laterality: N/A;   SENTINEL NODE BIOPSY Left 11/19/2016   Procedure: INGUINAL SENTINEL NODE BIOPSY;  Surgeon: Robert Bellow, MD;  Location: [facility] ORS;  Service: General;  Laterality: Left;   UPPER ESOPHAGEAL ENDOSCOPIC ULTRASOUND (EUS) N/A 01/29/2017   Procedure: UPPER ESOPHAGEAL ENDOSCOPIC ULTRASOUND (EUS);  Surgeon: Holly Bodily, MD;  Location: [facility] ENDOSCOPY;  Service: Endoscopy;  Laterality: N/A;    Current Outpatient Medications  Medication Sig Dispense Refill   albuterol (VENTOLIN HFA) 108 (90  Base) MCG/ACT inhaler Inhale 2 puffs into the lungs every 4 (four) hours as needed for wheezing or shortness of breath.     ALPRAZolam (XANAX) 0.5 MG tablet Take 1 tablet (0.5 mg total) by mouth 4 (four) times daily as needed for anxiety. 20 tablet 0   amLODipine (NORVASC) 5 MG tablet Take 5 mg by mouth daily.      aspirin 81 MG tablet Take 81 mg by mouth daily.      atorvastatin  (LIPITOR) 40 MG tablet TAKE 1 TABLET BY MOUTH DAILY 90 tablet 1   clobetasol ointment (TEMOVATE) 0.05 % Apply nightly to the affected area for 4 to 6 weeks and if your symptoms are improving then decrease the treatment to every other night for four weeks, then twice weekly for 4 weeks. If symptoms resolve then one to three times per week for maintenance. 30 g 2   clopidogrel (PLAVIX) 75 MG tablet Take 1 tablet (75 mg total) by mouth daily. 90 tablet 1   dexlansoprazole (DEXILANT) 60 MG capsule Take 1 capsule (60 mg total) by mouth daily. 30 capsule 0   DULoxetine (CYMBALTA) 60 MG capsule Take 60 mg by mouth daily. In the morning.     ezetimibe (ZETIA) 10 MG tablet Take 1 tablet (10 mg total) by mouth daily. 90 tablet 3   furosemide (LASIX) 20 MG tablet TAKE TWO TABLETS EVERY DAY AS NEEDED FOREDEMA     gabapentin (NEURONTIN) 600 MG tablet Take 600 mg by mouth 3 (three) times daily.      gabapentin (NEURONTIN) 600 MG tablet Take 1 tablet by mouth 3 (three) times daily.     hydroxychloroquine (PLAQUENIL) 200 MG tablet Take 200 mg by mouth 2 (two) times daily.      isosorbide mononitrate (IMDUR) 30 MG 24 hr tablet Take 0.5 tablets (15 mg total) by mouth daily. PLEASE CALL TO SCHEDULE OFFICE VISIT FOR FURTHER REFILLS. THANK YOU! 15 tablet 2   latanoprost (XALATAN) 0.005 % ophthalmic solution Place 1 drop into both eyes at bedtime.      metoprolol tartrate (LOPRESSOR) 25 MG tablet Take 0.5 tablets (12.5 mg total) by mouth 2 (two) times daily. 90 tablet 1   Multiple Vitamin (MULTIVITAMIN WITH MINERALS) TABS tablet Take 1 tablet by mouth daily.     Nintedanib (OFEV) 150 MG CAPS Take 1 capsule (150 mg total) by mouth daily. 90 capsule 1   nitroGLYCERIN (NITROSTAT) 0.4 MG SL tablet DISSOLVE 1 TABLET UNDER THE TONGUE EVERY5 MINUTES AS NEEDED FOR CHEST PAIN 25 tablet 4   polyethylene glycol (MIRALAX / GLYCOLAX) packet Take 17 g by mouth daily as needed (constipation.).      predniSONE (DELTASONE) 5 MG tablet  Take 1 tablet (5 mg total) by mouth 2 (two) times daily with a meal. RESUME AFTER COMPLETING THE PREDNISONE TAPER ORDERED SEPARATELY     zolpidem (AMBIEN) 10 MG tablet Take 1 tablet (10 mg total) by mouth at bedtime as needed for sleep. 10 tablet 0   HYDROcodone-acetaminophen (NORCO/VICODIN) 5-325 MG tablet Take 1 tablet by mouth every 4 (four) hours as needed.     Current Facility-Administered Medications  Medication Dose Route Frequency Provider Last Rate Last Admin   nitroGLYCERIN (NITROSTAT) SL tablet 0.4 mg  0.4 mg Sublingual Q5 min PRN Tommie Raymond, NP         Allergies:   Remicade [infliximab], Sulfa antibiotics, Actonel [risedronate sodium], Fosamax [alendronate sodium], Procardia [nifedipine], Elemental sulfur, and Wellbutrin [bupropion]   Social History:  The patient  reports that she has never smoked. She has never used smokeless tobacco. She reports that she does not drink alcohol and does not use drugs.   Family History:   family history includes Bladder Cancer in her father; COPD in her father; Cerebral palsy in her sister; Endometrial cancer (age of onset: 63) in her mother; Hypertension in her mother; Osteoporosis in her mother.    Review of Systems: Review of Systems  HENT: Negative.    Respiratory: Negative.    Cardiovascular: Negative.   Gastrointestinal: Negative.   Musculoskeletal: Negative.   Neurological: Negative.   Psychiatric/Behavioral: Negative.    All other systems reviewed and are negative.  PHYSICAL EXAM: VS:  BP 120/64 (BP Location: Left Arm, Patient Position: Sitting, Cuff Size: Normal)   Pulse 81   Ht 4' 11.5" (1.511 m)   Wt 153 lb 8 oz (69.6 kg)   LMP  (LMP Unknown)   BMI 30.48 kg/m  , BMI Body mass index is 30.48 kg/m. Constitutional:  oriented to person, place, and time. No distress.  HENT:  Head: Grossly normal Eyes:  no discharge. No scleral icterus.  Neck: No JVD, no carotid bruits  Cardiovascular: Regular rate and rhythm, no  murmurs appreciated Pulmonary/Chest: Clear to auscultation bilaterally, no wheezes or rails Abdominal: Soft.  no distension.  no tenderness.  Musculoskeletal: Normal range of motion Disfigured fingers from scleroderma Neurological:  normal muscle tone. Coordination normal. No atrophy Skin: Skin warm and dry Psychiatric: normal affect, pleasant   Recent Labs: 11/22/2020: Hemoglobin 10.7; Platelets 340 01/18/2021: ALT 19 01/25/2021: BUN 15; Creatinine, Ser 0.87; Potassium 4.5; Sodium 130    Lipid Panel No results found for: CHOL, HDL, LDLCALC, TRIG    Wt Readings from Last 3 Encounters:  09/23/21 153 lb 8 oz (69.6 kg)  05/01/21 152 lb 14.4 oz (69.4 kg)  03/22/21 155 lb (70.3 kg)     ASSESSMENT AND PLAN:  Shortness of breath interstitial fibrosis, not on oxygen Reports breathing is stable , Sedentary secondary to chronic knee pain Recommend Lasix every other day and extra as needed Was previously taking this daily but had low sodium Recommend she avoid free water, increase her salt intake Repeat lab work through primary care follow-up  Idiopathic interstitial fibrosis of lung syndrome ([place]) Followed by pulmonary, not on oxygen Reports symptoms are stable, lungs clear on exam  Scleroderma ([place]) Followed by rheumatology On chronic prednisone  Raynaud's disease without gangrene On chronic prednisone, some wound healing issues which is chronic  Osteoarthritis Reports she is in severe pain, no help from cortisone, cartilage injection Unclear if she is a candidate for knee replacement She would be acceptable risk from cardiac perspective, in January 2023  [name]  Stent to LAD December 2021 Cholesterol is at goal on the current lipid regimen. No changes to the medications were made. A1C 6 Denies anginal symptoms  Leg swelling Was previously taking Lasix daily, held and she had worsening ankle swelling, recommend she take Lasix every other day, increase salt, avoid free  water Has normal renal function   Total encounter time more than 25 minutes  Greater than 50% was spent in counseling and coordination of care with the patient     No orders of the defined types were placed in this encounter.    Signed, Esmond Plants, M.D., Ph.D. 09/23/2021  [place], [place]

## 2021-10-21 ENCOUNTER — Telehealth: Payer: Self-pay | Admitting: Cardiovascular Disease

## 2021-10-21 NOTE — Telephone Encounter (Signed)
Patient picked up hydroxychloroquine (PLAQUENIL) 200 MG today  Would like to clarify how to take medication  The instructions from Dr Rockey Situ and Dr Jefm Bryant are different  Please call to discuss

## 2021-10-21 NOTE — Telephone Encounter (Signed)
Return call to Erin [name], advised mistake that medication Plaquenil was order under Dr. Rockey Situ, this med is for her RA. Advised to take medication as order by Dr. Jefm Bryant, may need to call his office for clarification. Erin [name] verbalized understanding.   This RN called [facility], advised of mixed up, they will take script from Dr. Rockey Situ off automatic refill and remove from list, will refer to Dr. Scharlene Gloss script and refills for pt's Plaquenil.

## 2021-12-09 ENCOUNTER — Emergency Department: Payer: Medicare Other

## 2021-12-09 ENCOUNTER — Inpatient Hospital Stay: Payer: Medicare Other

## 2021-12-09 ENCOUNTER — Other Ambulatory Visit: Payer: Self-pay

## 2021-12-09 ENCOUNTER — Inpatient Hospital Stay
Admission: EM | Admit: 2021-12-09 | Discharge: 2021-12-19 | DRG: 516 | Disposition: A | Discharge status: Skilled Nursing Facility | Payer: Medicare Other | Attending: Family Medicine | Admitting: Family Medicine

## 2021-12-09 ENCOUNTER — Encounter: Payer: Self-pay | Admitting: Emergency Medicine

## 2021-12-09 DIAGNOSIS — Z79899 Other long term (current) drug therapy: Secondary | ICD-10-CM

## 2021-12-09 DIAGNOSIS — Z66 Do not resuscitate: Secondary | ICD-10-CM | POA: Diagnosis present

## 2021-12-09 DIAGNOSIS — I5032 Chronic diastolic (congestive) heart failure: Secondary | ICD-10-CM | POA: Diagnosis present

## 2021-12-09 DIAGNOSIS — Z9861 Coronary angioplasty status: Secondary | ICD-10-CM

## 2021-12-09 DIAGNOSIS — M069 Rheumatoid arthritis, unspecified: Secondary | ICD-10-CM | POA: Diagnosis present

## 2021-12-09 DIAGNOSIS — K219 Gastro-esophageal reflux disease without esophagitis: Secondary | ICD-10-CM | POA: Diagnosis present

## 2021-12-09 DIAGNOSIS — Y92009 Unspecified place in unspecified non-institutional (private) residence as the place of occurrence of the external cause: Secondary | ICD-10-CM

## 2021-12-09 DIAGNOSIS — F419 Anxiety disorder, unspecified: Secondary | ICD-10-CM | POA: Diagnosis present

## 2021-12-09 DIAGNOSIS — B3731 Acute candidiasis of vulva and vagina: Secondary | ICD-10-CM | POA: Diagnosis not present

## 2021-12-09 DIAGNOSIS — Z85820 Personal history of malignant melanoma of skin: Secondary | ICD-10-CM | POA: Diagnosis not present

## 2021-12-09 DIAGNOSIS — E871 Hypo-osmolality and hyponatremia: Secondary | ICD-10-CM | POA: Diagnosis present

## 2021-12-09 DIAGNOSIS — D638 Anemia in other chronic diseases classified elsewhere: Secondary | ICD-10-CM | POA: Diagnosis present

## 2021-12-09 DIAGNOSIS — R296 Repeated falls: Secondary | ICD-10-CM | POA: Diagnosis present

## 2021-12-09 DIAGNOSIS — J8410 Pulmonary fibrosis, unspecified: Secondary | ICD-10-CM | POA: Diagnosis present

## 2021-12-09 DIAGNOSIS — R339 Retention of urine, unspecified: Secondary | ICD-10-CM | POA: Diagnosis not present

## 2021-12-09 DIAGNOSIS — Z882 Allergy status to sulfonamides status: Secondary | ICD-10-CM

## 2021-12-09 DIAGNOSIS — E785 Hyperlipidemia, unspecified: Secondary | ICD-10-CM | POA: Diagnosis present

## 2021-12-09 DIAGNOSIS — W19XXXA Unspecified fall, initial encounter: Secondary | ICD-10-CM

## 2021-12-09 DIAGNOSIS — M4856XA Collapsed vertebra, not elsewhere classified, lumbar region, initial encounter for fracture: Principal | ICD-10-CM | POA: Diagnosis present

## 2021-12-09 DIAGNOSIS — D75839 Thrombocytosis, unspecified: Secondary | ICD-10-CM | POA: Diagnosis not present

## 2021-12-09 DIAGNOSIS — I10 Essential (primary) hypertension: Secondary | ICD-10-CM | POA: Diagnosis not present

## 2021-12-09 DIAGNOSIS — D509 Iron deficiency anemia, unspecified: Secondary | ICD-10-CM | POA: Diagnosis present

## 2021-12-09 DIAGNOSIS — G8929 Other chronic pain: Secondary | ICD-10-CM | POA: Diagnosis present

## 2021-12-09 DIAGNOSIS — Z90710 Acquired absence of both cervix and uterus: Secondary | ICD-10-CM

## 2021-12-09 DIAGNOSIS — Z7952 Long term (current) use of systemic steroids: Secondary | ICD-10-CM

## 2021-12-09 DIAGNOSIS — Z20822 Contact with and (suspected) exposure to covid-19: Secondary | ICD-10-CM | POA: Diagnosis present

## 2021-12-09 DIAGNOSIS — M349 Systemic sclerosis, unspecified: Secondary | ICD-10-CM | POA: Diagnosis present

## 2021-12-09 DIAGNOSIS — F418 Other specified anxiety disorders: Secondary | ICD-10-CM | POA: Diagnosis present

## 2021-12-09 DIAGNOSIS — Z955 Presence of coronary angioplasty implant and graft: Secondary | ICD-10-CM

## 2021-12-09 DIAGNOSIS — I2510 Atherosclerotic heart disease of native coronary artery without angina pectoris: Secondary | ICD-10-CM | POA: Diagnosis present

## 2021-12-09 DIAGNOSIS — R32 Unspecified urinary incontinence: Secondary | ICD-10-CM | POA: Diagnosis present

## 2021-12-09 DIAGNOSIS — S32010A Wedge compression fracture of first lumbar vertebra, initial encounter for closed fracture: Secondary | ICD-10-CM | POA: Diagnosis present

## 2021-12-09 DIAGNOSIS — Z888 Allergy status to other drugs, medicaments and biological substances status: Secondary | ICD-10-CM

## 2021-12-09 DIAGNOSIS — F32A Depression, unspecified: Secondary | ICD-10-CM | POA: Diagnosis present

## 2021-12-09 DIAGNOSIS — Z7982 Long term (current) use of aspirin: Secondary | ICD-10-CM

## 2021-12-09 DIAGNOSIS — J84112 Idiopathic pulmonary fibrosis: Secondary | ICD-10-CM | POA: Diagnosis present

## 2021-12-09 DIAGNOSIS — W1830XA Fall on same level, unspecified, initial encounter: Secondary | ICD-10-CM | POA: Diagnosis present

## 2021-12-09 DIAGNOSIS — Z7902 Long term (current) use of antithrombotics/antiplatelets: Secondary | ICD-10-CM

## 2021-12-09 DIAGNOSIS — I110 Hypertensive heart disease with heart failure: Secondary | ICD-10-CM | POA: Diagnosis present

## 2021-12-09 DIAGNOSIS — D649 Anemia, unspecified: Secondary | ICD-10-CM | POA: Diagnosis not present

## 2021-12-09 DIAGNOSIS — Z96642 Presence of left artificial hip joint: Secondary | ICD-10-CM | POA: Diagnosis present

## 2021-12-09 LAB — RESP PANEL BY RT-PCR (FLU A&B, COVID) ARPGX2
Influenza A by PCR: NEGATIVE
Influenza B by PCR: NEGATIVE
SARS Coronavirus 2 by RT PCR: NEGATIVE

## 2021-12-09 LAB — COMPREHENSIVE METABOLIC PANEL WITH GFR
ALT: 18 U/L (ref 0–44)
AST: 23 U/L (ref 15–41)
Albumin: 2.7 g/dL — ABNORMAL LOW (ref 3.5–5.0)
Alkaline Phosphatase: 91 U/L (ref 38–126)
Anion gap: 9 (ref 5–15)
BUN: 12 mg/dL (ref 8–23)
CO2: 24 mmol/L (ref 22–32)
Calcium: 8.4 mg/dL — ABNORMAL LOW (ref 8.9–10.3)
Chloride: 99 mmol/L (ref 98–111)
Creatinine, Ser: 0.76 mg/dL (ref 0.44–1.00)
GFR, Estimated: >60 mL/min
Glucose, Bld: 91 mg/dL (ref 70–99)
Potassium: 4.1 mmol/L (ref 3.5–5.1)
Sodium: 132 mmol/L — ABNORMAL LOW (ref 135–145)
Total Bilirubin: 0.7 mg/dL (ref 0.3–1.2)
Total Protein: 5.9 g/dL — ABNORMAL LOW (ref 6.5–8.1)

## 2021-12-09 LAB — CBC WITH DIFFERENTIAL/PLATELET
Abs Immature Granulocytes: 0.09 K/uL — ABNORMAL HIGH (ref 0.00–0.07)
Basophils Absolute: 0.1 K/uL (ref 0.0–0.1)
Basophils Relative: 1 %
Eosinophils Absolute: 0.2 K/uL (ref 0.0–0.5)
Eosinophils Relative: 1 %
HCT: 29.9 % — ABNORMAL LOW (ref 36.0–46.0)
Hemoglobin: 9.6 g/dL — ABNORMAL LOW (ref 12.0–15.0)
Immature Granulocytes: 1 %
Lymphocytes Relative: 9 %
Lymphs Abs: 1.3 K/uL (ref 0.7–4.0)
MCH: 27.6 pg (ref 26.0–34.0)
MCHC: 32.1 g/dL (ref 30.0–36.0)
MCV: 85.9 fL (ref 80.0–100.0)
Monocytes Absolute: 1.5 K/uL — ABNORMAL HIGH (ref 0.1–1.0)
Monocytes Relative: 10 %
Neutro Abs: 11.4 K/uL — ABNORMAL HIGH (ref 1.7–7.7)
Neutrophils Relative %: 78 %
Platelets: 353 K/uL (ref 150–400)
RBC: 3.48 MIL/uL — ABNORMAL LOW (ref 3.87–5.11)
RDW: 16.5 % — ABNORMAL HIGH (ref 11.5–15.5)
WBC: 14.6 K/uL — ABNORMAL HIGH (ref 4.0–10.5)
nRBC: 0 % (ref 0.0–0.2)

## 2021-12-09 LAB — APTT: aPTT: 32 seconds (ref 24–36)

## 2021-12-09 LAB — BRAIN NATRIURETIC PEPTIDE: B Natriuretic Peptide: 300.3 pg/mL — ABNORMAL HIGH (ref 0.0–100.0)

## 2021-12-09 LAB — PROTIME-INR
INR: 1.2 (ref 0.8–1.2)
Prothrombin Time: 15.4 seconds — ABNORMAL HIGH (ref 11.4–15.2)

## 2021-12-09 MED ORDER — ALPRAZOLAM 0.5 MG PO TABS
0.5000 mg | ORAL_TABLET | Freq: Three times a day (TID) | ORAL | Status: DC | PRN
  Administered 2021-12-10 – 2021-12-19 (×9): 0.5 mg via ORAL
  Filled 2021-12-09 (×9): qty 1

## 2021-12-09 MED ORDER — METHOCARBAMOL 500 MG PO TABS
500.0000 mg | ORAL_TABLET | Freq: Three times a day (TID) | ORAL | Status: DC | PRN
  Administered 2021-12-09 – 2021-12-18 (×12): 500 mg via ORAL
  Filled 2021-12-09 (×13): qty 1

## 2021-12-09 MED ORDER — PANTOPRAZOLE SODIUM 40 MG PO TBEC
40.0000 mg | DELAYED_RELEASE_TABLET | Freq: Every day | ORAL | Status: DC
  Administered 2021-12-10 – 2021-12-19 (×10): 40 mg via ORAL
  Filled 2021-12-09 (×10): qty 1

## 2021-12-09 MED ORDER — AMLODIPINE BESYLATE 5 MG PO TABS
5.0000 mg | ORAL_TABLET | Freq: Every day | ORAL | Status: DC
  Administered 2021-12-10 – 2021-12-19 (×10): 5 mg via ORAL
  Filled 2021-12-09 (×10): qty 1

## 2021-12-09 MED ORDER — NITROGLYCERIN 0.4 MG SL SUBL: 0.4000 mg | SUBLINGUAL_TABLET | SUBLINGUAL | Status: DC | PRN

## 2021-12-09 MED ORDER — ATORVASTATIN CALCIUM 20 MG PO TABS
40.0000 mg | ORAL_TABLET | Freq: Every day | ORAL | Status: DC
Start: 2021-12-10 — End: 2021-12-19
  Administered 2021-12-10 – 2021-12-19 (×10): 40 mg via ORAL
  Filled 2021-12-09 (×10): qty 2

## 2021-12-09 MED ORDER — EZETIMIBE 10 MG PO TABS
10.0000 mg | ORAL_TABLET | Freq: Every day | ORAL | Status: DC
Start: 2021-12-10 — End: 2021-12-19
  Administered 2021-12-10 – 2021-12-19 (×10): 10 mg via ORAL
  Filled 2021-12-09 (×10): qty 1

## 2021-12-09 MED ORDER — LIDOCAINE 5 % EX PTCH
1.0000 | MEDICATED_PATCH | CUTANEOUS | Status: DC
  Administered 2021-12-09 – 2021-12-19 (×10): 1 via TRANSDERMAL
  Filled 2021-12-09 (×12): qty 1

## 2021-12-09 MED ORDER — HYDRALAZINE HCL 20 MG/ML IJ SOLN: 5.0000 mg | INTRAMUSCULAR | Status: DC | PRN

## 2021-12-09 MED ORDER — ASPIRIN 81 MG PO CHEW
81.0000 mg | CHEWABLE_TABLET | Freq: Every day | ORAL | Status: DC
  Administered 2021-12-11 – 2021-12-19 (×5): 81 mg via ORAL
  Filled 2021-12-09 (×7): qty 1

## 2021-12-09 MED ORDER — PREDNISONE 10 MG PO TABS
5.0000 mg | ORAL_TABLET | Freq: Every day | ORAL | Status: DC
  Administered 2021-12-11 – 2021-12-19 (×8): 5 mg via ORAL
  Filled 2021-12-09 (×8): qty 1

## 2021-12-09 MED ORDER — FUROSEMIDE 20 MG PO TABS: 20.0000 mg | ORAL_TABLET | ORAL | Status: DC

## 2021-12-09 MED ORDER — ISOSORBIDE MONONITRATE ER 30 MG PO TB24
15.0000 mg | ORAL_TABLET | Freq: Every day | ORAL | Status: DC
  Administered 2021-12-10 – 2021-12-19 (×10): 15 mg via ORAL
  Filled 2021-12-09 (×10): qty 1

## 2021-12-09 MED ORDER — METOPROLOL TARTRATE 25 MG PO TABS
12.5000 mg | ORAL_TABLET | Freq: Two times a day (BID) | ORAL | Status: DC
  Administered 2021-12-10 – 2021-12-19 (×16): 12.5 mg via ORAL
  Filled 2021-12-09 (×18): qty 1

## 2021-12-09 MED ORDER — HYDROCODONE-ACETAMINOPHEN 5-325 MG PO TABS: 1.0000 | ORAL_TABLET | ORAL | Status: DC | PRN

## 2021-12-09 MED ORDER — HYDROCORTISONE SOD SUC (PF) 100 MG IJ SOLR
50.0000 mg | Freq: Once | INTRAMUSCULAR | Status: AC
  Administered 2021-12-09: 50 mg via INTRAVENOUS
  Filled 2021-12-09: qty 1

## 2021-12-09 MED ORDER — FENTANYL CITRATE PF 50 MCG/ML IJ SOSY: 25.0000 ug | PREFILLED_SYRINGE | INTRAMUSCULAR | Status: DC | PRN

## 2021-12-09 MED ORDER — OXYCODONE-ACETAMINOPHEN 5-325 MG PO TABS
1.0000 | ORAL_TABLET | ORAL | Status: DC | PRN
  Administered 2021-12-09 – 2021-12-19 (×20): 1 via ORAL
  Filled 2021-12-09 (×20): qty 1

## 2021-12-09 MED ORDER — NINTEDANIB ESYLATE 150 MG PO CAPS: 150.0000 mg | ORAL_CAPSULE | Freq: Every day | ORAL | Status: DC

## 2021-12-09 MED ORDER — LATANOPROST 0.005 % OP SOLN
1.0000 [drp] | Freq: Every day | OPHTHALMIC | Status: DC
  Administered 2021-12-11 – 2021-12-18 (×7): 1 [drp] via OPHTHALMIC
  Filled 2021-12-09 (×2): qty 2.5

## 2021-12-09 MED ORDER — MORPHINE SULFATE (PF) 2 MG/ML IV SOLN
2.0000 mg | INTRAVENOUS | Status: DC | PRN
  Administered 2021-12-09 (×2): 2 mg via INTRAVENOUS
  Filled 2021-12-09 (×3): qty 1

## 2021-12-09 MED ORDER — ALBUTEROL SULFATE HFA 108 (90 BASE) MCG/ACT IN AERS: 2.0000 | INHALATION_SPRAY | RESPIRATORY_TRACT | Status: DC | PRN

## 2021-12-09 MED ORDER — OXYCODONE HCL 5 MG PO TABS
5.0000 mg | ORAL_TABLET | Freq: Once | ORAL | Status: AC
  Administered 2021-12-09: 5 mg via ORAL
  Filled 2021-12-09: qty 1

## 2021-12-09 MED ORDER — DULOXETINE HCL 60 MG PO CPEP
60.0000 mg | ORAL_CAPSULE | Freq: Every day | ORAL | Status: DC
Start: 2021-12-10 — End: 2021-12-19
  Administered 2021-12-10 – 2021-12-19 (×10): 60 mg via ORAL
  Filled 2021-12-09 (×10): qty 1

## 2021-12-09 MED ORDER — ENOXAPARIN SODIUM 40 MG/0.4ML IJ SOSY
0.5000 mg/kg | PREFILLED_SYRINGE | INTRAMUSCULAR | Status: DC
  Administered 2021-12-09 – 2021-12-10 (×2): 40 mg via SUBCUTANEOUS
  Administered 2021-12-11 – 2021-12-18 (×7): 35 mg via SUBCUTANEOUS
  Filled 2021-12-09 (×10): qty 0.4

## 2021-12-09 MED ORDER — CLOPIDOGREL BISULFATE 75 MG PO TABS: 75.0000 mg | ORAL_TABLET | Freq: Every day | ORAL | Status: DC

## 2021-12-09 MED ORDER — ACETAMINOPHEN 500 MG PO TABS
1000.0000 mg | ORAL_TABLET | Freq: Once | ORAL | Status: AC
  Administered 2021-12-09: 1000 mg via ORAL
  Filled 2021-12-09: qty 2

## 2021-12-09 MED ORDER — ADULT MULTIVITAMIN W/MINERALS CH
1.0000 | ORAL_TABLET | Freq: Every day | ORAL | Status: DC
  Administered 2021-12-11 – 2021-12-19 (×8): 1 via ORAL
  Filled 2021-12-09 (×7): qty 1

## 2021-12-09 MED ORDER — GABAPENTIN 600 MG PO TABS
600.0000 mg | ORAL_TABLET | Freq: Three times a day (TID) | ORAL | Status: DC
  Administered 2021-12-10 – 2021-12-11 (×4): 600 mg via ORAL
  Filled 2021-12-09 (×4): qty 1

## 2021-12-09 MED ORDER — HYDROXYCHLOROQUINE SULFATE 200 MG PO TABS: 200.0000 mg | ORAL_TABLET | Freq: Two times a day (BID) | ORAL | Status: DC

## 2021-12-09 MED ORDER — DM-GUAIFENESIN ER 30-600 MG PO TB12
1.0000 | ORAL_TABLET | Freq: Two times a day (BID) | ORAL | Status: DC | PRN
  Filled 2021-12-09: qty 1

## 2021-12-09 MED ORDER — ZOLPIDEM TARTRATE 5 MG PO TABS
5.0000 mg | ORAL_TABLET | Freq: Every evening | ORAL | Status: DC | PRN
  Administered 2021-12-10 – 2021-12-18 (×9): 5 mg via ORAL
  Filled 2021-12-09 (×10): qty 1

## 2021-12-09 MED ORDER — ALBUTEROL SULFATE (2.5 MG/3ML) 0.083% IN NEBU: 3.0000 mL | INHALATION_SOLUTION | RESPIRATORY_TRACT | Status: DC | PRN

## 2021-12-09 MED ORDER — ACETAMINOPHEN 325 MG PO TABS: 650.0000 mg | ORAL_TABLET | Freq: Four times a day (QID) | ORAL | Status: DC | PRN

## 2021-12-09 MED ORDER — ONDANSETRON HCL 4 MG/2ML IJ SOLN: 4.0000 mg | Freq: Three times a day (TID) | INTRAMUSCULAR | Status: DC | PRN

## 2021-12-09 MED ORDER — POLYETHYLENE GLYCOL 3350 17 G PO PACK: 17.0000 g | PACK | Freq: Every day | ORAL | Status: DC | PRN

## 2021-12-09 NOTE — H&P (Signed)
History and Physical    Erin [name] ZOX:[id number] DOB: 1946/10/14 DOA: 12/09/2021  Referring MD/NP/PA:   PCP: Idelle Crouch, MD   Patient coming from:  The patient is coming from home.  At baseline, pt is independent for most of ADL.        Chief Complaint: fall and back pain   HPI: Erin [name] is a 76 y.o. 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  Patient states that she accidentally fell on 12/24, injured her lower back.  She developed back pain.  She has not been able to ambulate normally.  Most of the time, patient has been laying in the bed.  She states that she fell again yesterday, causing more pain in lower back and left hip.  She states that she possibly injured her back of head, but no headache.  She has chronic neck pain which has not changed. The pain is constant, sharp, severe, nonradiating.  She states she has chronic intermittent urinary incontinence, no acute loss control of bladder or bowel movement.  She has chronic shortness breath and cough due to pulmonary fibrosis, which has not changed significantly recently.  No chest pain.  No fever or chills.  Denies nausea, vomiting, diarrhea or abdominal pain.  No symptoms of UTI.  She has deformity in fingers and toes due to rheumatoid arthritis.   ED Course: pt was found to have WBC 14.6, negative COVID PCR, GFR > 60, temperature normal, blood pressure 144/74, heart rate 86, RR 18, oxygen saturation 97% on room air.  CT scan of her pelvis is negative for acute injury.  X-ray of left hip/pelvis is negative.  MRI of the lumbar spine showed L1 compression fracture with 30% of height loss.  Patient is admitted to [facility] bed as inpatient.  Dr. Rudene Christians of Ortho is consulted.  MRI-L  spin: 1. Acute L1 compression fracture with 30% height loss and mild, noncompressive retropulsion. 2. Partially covered edema in the lower right sacrum, question insufficiency fracture at this level. 3. Lumbar spine degeneration with foraminal impingement on the left at L2-3 and to a lesser extent on the right at L5-S1   CT-head: 1. Progressive atrophy and white matter disease. This likely reflects the sequela of chronic microvascular ischemia. 2. 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 3. No acute intracranial abnormality or significant interval change. No acute hemorrhage  Review of Systems:   General: no fevers, chills, no body weight gain, has fatigue HEENT: no blurry vision, hearing changes or sore throat Respiratory: has dyspnea, coughing, no wheezing CV: no chest pain, no palpitations GI: no nausea, vomiting, abdominal pain, diarrhea, constipation GU: no dysuria, burning on urination, increased urinary frequency, hematuria  Ext: has trace leg edema Neuro: no unilateral weakness, numbness, or tingling, no vision change or hearing loss. Has fall. Skin: no rash, no skin tear. MSK: has lower back pain, left hip pain and chronic neck pain Heme: No easy bruising.  Travel history: No recent long distant travel.  Allergy:  Allergies  Allergen Reactions   Remicade [Infliximab] Shortness Of Breath and Itching   Sulfa Antibiotics Other (See Comments)    Other Reaction: tounge cracked   Actonel [Risedronate Sodium] Other (See Comments)   Fosamax [Alendronate Sodium]     GI Bleed  Procardia [Nifedipine] Hives   Elemental Sulfur Swelling   Wellbutrin [Bupropion] Anxiety    Past Medical History:  Diagnosis Date   Anemia    h/o   Cancer ([place])    melanoma x 3the last one was of her leg and surgeon removed it   Colon polyp    Complication of anesthesia    PT STATED THAT WITH JOINT SURGERY THAT SHE WAS GIVEN SOME TYPE OF ANESTHESIA THAT  MADE HER HALLUCINATE    Coronary artery calcification    Coronary artery disease    DDD (degenerative disc disease), lumbar    Diverticulosis 01/20/2017   Dysphagia    Dyspnea    with exertion-unable to walk a mile without getting sob- dr sparks set pt up to see Dr Rockey Situ after 11-19-16 surgery   Environmental allergies    Esophageal dysmotility    Fibrocystic breast disease    GERD (gastroesophageal reflux disease)    Heart murmur    asymptomatic   Hyperlipidemia    unspecified   Hypertension    Mitral regurgitation    Monilial esophagitis ([place]) 01/20/2017   Obstipation    Osteopenia    Peptic ulcer disease    Pulmonary fibrosis ([place])    per Dr Raul Del   RA (rheumatoid arthritis) ([place])    Raynaud's disease    Redundant colon 01/20/2017   Scleroderma ([place])    Telangiectasia of colon    Tubular adenoma of colon 01/20/2017   unspecified   Vulvar dysplasia     Past Surgical History:  Procedure Laterality Date   ABDOMINAL HYSTERECTOMY     BREAST CYST EXCISION Left 20+ years ago   No scar visible   BREAST LUMPECTOMY Right    CARDIAC CATHETERIZATION     CARPAL TUNNEL RELEASE Bilateral    COLON SURGERY     colon polyp    COLONOSCOPY  09/30/2011   tubular adenoma rtm   COLONOSCOPY     05/02/2003, 04/10/2000   COLONOSCOPY WITH PROPOFOL N/A 01/20/2017   Procedure: COLONOSCOPY WITH PROPOFOL;  Surgeon: Lollie Sails, MD;  Location: [facility] ENDOSCOPY;  Service: Endoscopy;  Laterality: N/A;   COLONOSCOPY WITH PROPOFOL N/A 09/17/2020   Procedure: COLONOSCOPY WITH PROPOFOL;  Surgeon: Lesly Rubenstein, MD;  Location: [facility] ENDOSCOPY;  Service: Endoscopy;  Laterality: N/A;   CORONARY ATHERECTOMY N/A 11/21/2020   Procedure: CORONARY ATHERECTOMY;  Surgeon: Wellington Hampshire, MD;  Location: [facility] CV LAB;  Service: Cardiovascular;  Laterality: N/A;   CORONARY STENT INTERVENTION  11/21/2020   CORONARY STENT INTERVENTION N/A 11/21/2020   Procedure: CORONARY STENT INTERVENTION;   Surgeon: Wellington Hampshire, MD;  Location: [place] CV LAB;  Service: Cardiovascular;  Laterality: N/A;   ESOPHAGOGASTRODUODENOSCOPY     ESOPHAGOGASTRODUODENOSCOPY     09/30/2011, 04/10/2000 , no repeat rtm   ESOPHAGOGASTRODUODENOSCOPY N/A 03/02/2018   Procedure: ESOPHAGOGASTRODUODENOSCOPY (EGD);  Surgeon: Virgel Manifold, MD;  Location: [facility] ENDOSCOPY;  Service: Endoscopy;  Laterality: N/A;   ESOPHAGOGASTRODUODENOSCOPY (EGD) WITH PROPOFOL N/A 01/20/2017   Procedure: ESOPHAGOGASTRODUODENOSCOPY (EGD) WITH PROPOFOL;  Surgeon: Lollie Sails, MD;  Location: [facility] ENDOSCOPY;  Service: Endoscopy;  Laterality: N/A;   ESOPHAGOGASTRODUODENOSCOPY (EGD) WITH PROPOFOL N/A 09/17/2020   Procedure: ESOPHAGOGASTRODUODENOSCOPY (EGD) WITH PROPOFOL;  Surgeon: Lesly Rubenstein, MD;  Location: [facility] ENDOSCOPY;  Service: Endoscopy;  Laterality: N/A;   EXCISION HYDRADENITIS LABIA Left 11/19/2016   Procedure: EXCISION LABIAL MASS;  Surgeon: Robert Bellow, MD;  Location: [facility] ORS;  Service: General;  Laterality: Left;  EYE SURGERY Bilateral    cataracts   HEEL SPUR EXCISION     HIP ARTHROPLASTY Left 02/23/2018   Procedure: ARTHROPLASTY BIPOLAR HIP (HEMIARTHROPLASTY);  Surgeon: Thornton Park, MD;  Location: [facility] ORS;  Service: Orthopedics;  Laterality: Left;   KYPHOPLASTY N/A 04/26/2019   Procedure: KYPHOPLASTY T7;  Surgeon: Hessie Knows, MD;  Location: [facility] ORS;  Service: Orthopedics;  Laterality: N/A;   KYPHOPLASTY N/A 05/17/2019   Procedure: KYPHOPLASTY T8;  Surgeon: Hessie Knows, MD;  Location: [facility] ORS;  Service: Orthopedics;  Laterality: N/A;   KYPHOPLASTY N/A 07/26/2019   Procedure: T-9 KYPHOPLASTY;  Surgeon: Hessie Knows, MD;  Location: [facility] ORS;  Service: Orthopedics;  Laterality: N/A;   MASS EXCISION Left 11/19/2016   Procedure: EXCISION LEFT THIGH MELANOMA;  Surgeon: Robert Bellow, MD;  Location: [facility] ORS;  Service: General;  Laterality: Left;   RIGHT/LEFT HEART CATH AND CORONARY  ANGIOGRAPHY N/A 11/15/2020   Procedure: RIGHT/LEFT HEART CATH AND CORONARY ANGIOGRAPHY poss intervention;  Surgeon: Minna Merritts, MD;  Location: [place] CV LAB;  Service: Cardiovascular;  Laterality: N/A;   SENTINEL NODE BIOPSY Left 11/19/2016   Procedure: INGUINAL SENTINEL NODE BIOPSY;  Surgeon: Robert Bellow, MD;  Location: [facility] ORS;  Service: General;  Laterality: Left;   UPPER ESOPHAGEAL ENDOSCOPIC ULTRASOUND (EUS) N/A 01/29/2017   Procedure: UPPER ESOPHAGEAL ENDOSCOPIC ULTRASOUND (EUS);  Surgeon: Holly Bodily, MD;  Location: [facility] ENDOSCOPY;  Service: Endoscopy;  Laterality: N/A;    Social History:  reports that she has never smoked. She has never used smokeless tobacco. She reports that she does not drink alcohol and does not use drugs.  Family History:  Family History  Problem Relation Age of Onset   Endometrial cancer Mother 70   Hypertension Mother    Osteoporosis Mother    Bladder Cancer Father    COPD Father    Cerebral palsy Sister      Prior to Admission medications   Medication Sig Start Date End Date Taking? Authorizing Provider  albuterol (VENTOLIN HFA) 108 (90 Base) MCG/ACT inhaler Inhale 2 puffs into the lungs every 4 (four) hours as needed for wheezing or shortness of breath. 06/02/19   Bonnielee Haff, MD  ALPRAZolam Duanne Moron) 0.5 MG tablet Take 1 tablet (0.5 mg total) by mouth 4 (four) times daily as needed for anxiety. 06/02/19   Bonnielee Haff, MD  amLODipine (NORVASC) 5 MG tablet Take 1 tablet (5 mg total) by mouth daily. 09/23/21   Minna Merritts, MD  aspirin 81 MG tablet Take 81 mg by mouth daily.     [provider]  atorvastatin (LIPITOR) 40 MG tablet Take 1 tablet (40 mg total) by mouth daily. 09/23/21   Minna Merritts, MD  clobetasol ointment (TEMOVATE) 0.05 % Apply nightly to the affected area for 4 to 6 weeks and if your symptoms are improving then decrease the treatment to every other night for four weeks, then twice weekly  for 4 weeks. If symptoms resolve then one to three times per week for maintenance. 05/07/21   Gillis Ends, MD  clopidogrel (PLAVIX) 75 MG tablet Take 1 tablet (75 mg total) by mouth daily. 09/23/21   Minna Merritts, MD  dexlansoprazole (DEXILANT) 60 MG capsule Take 1 capsule (60 mg total) by mouth daily. 03/03/18   Hillary Bow, MD  DULoxetine (CYMBALTA) 60 MG capsule Take 60 mg by mouth daily. In the morning. 08/05/18   [provider]  ezetimibe (ZETIA) 10 MG tablet Take 1 tablet (  10 mg total) by mouth daily. 09/23/21   Minna Merritts, MD  furosemide (LASIX) 20 MG tablet Take 1 tablet (20 mg total) by mouth every other day. May take extra 20 mg for swelling or shortness of breath 09/23/21   Minna Merritts, MD  gabapentin (NEURONTIN) 600 MG tablet Take 600 mg by mouth 3 (three) times daily.     [provider]  gabapentin (NEURONTIN) 600 MG tablet Take 1 tablet by mouth 3 (three) times daily. 04/05/21   [provider]  HYDROcodone-acetaminophen (NORCO/VICODIN) 5-325 MG tablet Take 1 tablet by mouth every 4 (four) hours as needed. 09/17/21   [provider]  hydroxychloroquine (PLAQUENIL) 200 MG tablet Take 1 tablet (200 mg total) by mouth 2 (two) times daily. 09/23/21   Minna Merritts, MD  isosorbide mononitrate (IMDUR) 30 MG 24 hr tablet Take 0.5 tablets (15 mg total) by mouth daily. 09/23/21   Minna Merritts, MD  latanoprost (XALATAN) 0.005 % ophthalmic solution Place 1 drop into both eyes at bedtime.     [provider]  metoprolol tartrate (LOPRESSOR) 25 MG tablet Take 0.5 tablets (12.5 mg total) by mouth 2 (two) times daily. 09/23/21   Minna Merritts, MD  Multiple Vitamin (MULTIVITAMIN WITH MINERALS) TABS tablet Take 1 tablet by mouth daily.    [provider]  Nintedanib (OFEV) 150 MG CAPS Take 1 capsule (150 mg total) by mouth daily. 05/22/20   Martyn Ehrich, NP  nitroGLYCERIN (NITROSTAT) 0.4 MG SL tablet  DISSOLVE 1 TABLET UNDER THE TONGUE EVERY5 MINUTES AS NEEDED FOR CHEST PAIN 04/15/21   Minna Merritts, MD  polyethylene glycol (MIRALAX / GLYCOLAX) packet Take 17 g by mouth daily as needed (constipation.).     [provider]  predniSONE (DELTASONE) 5 MG tablet Take 1 tablet (5 mg total) by mouth 2 (two) times daily with a meal. RESUME AFTER COMPLETING THE PREDNISONE TAPER ORDERED SEPARATELY 06/11/19   Bonnielee Haff, MD  zolpidem (AMBIEN) 10 MG tablet Take 1 tablet (10 mg total) by mouth at bedtime as needed for sleep. 06/02/19   Bonnielee Haff, MD    Physical Exam: Vitals:   12/09/21 0900 12/09/21 0952 12/09/21 1029 12/09/21 1545  BP: 138/80  (!) 106/59 115/70  Pulse: 82  82 87  Resp: 20  16 17   Temp: 99.1 F (37.3 C)  97.7 F (36.5 C) 97.8 F (36.6 C)  TempSrc: Oral     SpO2: 94%  95% 96%  Weight:  70.8 kg    Height:  5' (1.524 m)     General: Not in acute distress HEENT:       Eyes: PERRL, EOMI, no scleral icterus.       ENT: No discharge from the ears and nose, no pharynx injection, no tonsillar enlargement.        Neck: No JVD, no bruit, no mass felt. Heme: No neck lymph node enlargement. Cardiac: S1/S2, RRR, No gallops or rubs. Respiratory: Has decreased air movement bilaterally GI: Soft, nondistended, nontender, no rebound pain, no organomegaly, BS present. GU: No hematuria Ext: has trace leg edema bilaterally. 1+DP/PT pulse bilaterally. Musculoskeletal: has tenderness in lower back at midline.  Skin: no skin tear  Neuro: Alert, oriented X3, cranial nerves II-XII grossly intact. Psych: Patient is not psychotic, no suicidal or hemocidal ideation.  Labs on Admission: I have personally reviewed following labs and imaging studies  CBC: Recent Labs  Lab 12/09/21 0512  WBC 14.6*  NEUTROABS 11.4*  HGB 9.6*  HCT 29.9*  MCV 85.9  PLT 010   Basic Metabolic Panel: Recent Labs  Lab 12/09/21 0512  NA 132*  K 4.1  CL 99  CO2 24  GLUCOSE 91  BUN 12   CREATININE 0.76  CALCIUM 8.4*   GFR: Estimated Creatinine Clearance: 52.5 mL/min (by C-G formula based on SCr of 0.76 mg/dL). Liver Function Tests: Recent Labs  Lab 12/09/21 0512  AST 23  ALT 18  ALKPHOS 91  BILITOT 0.7  PROT 5.9*  ALBUMIN 2.7*   No results for input(s): LIPASE, AMYLASE in the last 168 hours. No results for input(s): AMMONIA in the last 168 hours. Coagulation Profile: Recent Labs  Lab 12/09/21 1033  INR 1.2   Cardiac Enzymes: No results for input(s): CKTOTAL, CKMB, CKMBINDEX, TROPONINI in the last 168 hours. BNP (last 3 results) No results for input(s): PROBNP in the last 8760 hours. HbA1C: No results for input(s): HGBA1C in the last 72 hours. CBG: No results for input(s): GLUCAP in the last 168 hours. Lipid Profile: No results for input(s): CHOL, HDL, LDLCALC, TRIG, CHOLHDL, LDLDIRECT in the last 72 hours. Thyroid Function Tests: No results for input(s): TSH, T4TOTAL, FREET4, T3FREE, THYROIDAB in the last 72 hours. Anemia Panel: No results for input(s): VITAMINB12, FOLATE, FERRITIN, TIBC, IRON, RETICCTPCT in the last 72 hours. Urine analysis:    Component Value Date/Time   COLORURINE YELLOW (A) 03/17/2018 0700   APPEARANCEUR CLEAR (A) 03/17/2018 0700   LABSPEC 1.008 03/17/2018 0700   PHURINE 6.0 03/17/2018 0700   GLUCOSEU NEGATIVE 03/17/2018 0700   HGBUR NEGATIVE 03/17/2018 0700   BILIRUBINUR NEGATIVE 03/17/2018 0700   KETONESUR NEGATIVE 03/17/2018 0700   PROTEINUR NEGATIVE 03/17/2018 0700   NITRITE NEGATIVE 03/17/2018 0700   LEUKOCYTESUR TRACE (A) 03/17/2018 0700   Sepsis Labs: @LABRCNTIP (procalcitonin:4,lacticidven:4) ) Recent Results (from the past 240 hour(s))  Resp Panel by RT-PCR (Flu A&B, Covid) Nasopharyngeal Swab     Status: None   Collection Time: 12/09/21  5:12 AM   Specimen: Nasopharyngeal Swab; Nasopharyngeal(NP) swabs in vial transport medium  Result Value Ref Range Status   SARS Coronavirus 2 by RT PCR NEGATIVE NEGATIVE  Final    Comment: (NOTE) SARS-CoV-2 target nucleic acids are NOT DETECTED.  The SARS-CoV-2 RNA is generally detectable in upper respiratory specimens during the acute phase of infection. The lowest concentration of SARS-CoV-2 viral copies this assay can detect is 138 copies/mL. A negative result does not preclude SARS-Cov-2 infection and should not be used as the sole basis for treatment or other patient management decisions. A negative result may occur with  improper specimen collection/handling, submission of specimen other than nasopharyngeal swab, presence of viral mutation(s) within the areas targeted by this assay, and inadequate number of viral copies(<138 copies/mL). A negative result must be combined with clinical observations, patient history, and epidemiological information. The expected result is Negative.  Fact Sheet for Patients:  EntrepreneurPulse.com.au  Fact Sheet for Healthcare Providers:  IncredibleEmployment.be  This test is no t yet approved or cleared by the [place] FDA and  has been authorized for detection and/or diagnosis of SARS-CoV-2 by FDA under an Emergency Use Authorization (EUA). This EUA will remain  in effect (meaning this test can be used) for the duration of the COVID-19 declaration under Section 564(b)(1) of the Act, 21 U.S.C.section 360bbb-3(b)(1), unless the authorization is terminated  or revoked sooner.       Influenza A by PCR NEGATIVE NEGATIVE Final   Influenza B by PCR NEGATIVE  NEGATIVE Final    Comment: (NOTE) The Xpert Xpress SARS-CoV-2/FLU/RSV plus assay is intended as an aid in the diagnosis of influenza from Nasopharyngeal swab specimens and should not be used as a sole basis for treatment. Nasal washings and aspirates are unacceptable for Xpert Xpress SARS-CoV-2/FLU/RSV testing.  Fact Sheet for Patients: EntrepreneurPulse.com.au  Fact Sheet for Healthcare  Providers: IncredibleEmployment.be  This test is not yet approved or cleared by the [place] FDA and has been authorized for detection and/or diagnosis of SARS-CoV-2 by FDA under an Emergency Use Authorization (EUA). This EUA will remain in effect (meaning this test can be used) for the duration of the COVID-19 declaration under Section 564(b)(1) of the Act, 21 U.S.C. section 360bbb-3(b)(1), unless the authorization is terminated or revoked.  Performed at [facility], [address] [place], [place] [address]      Radiological Exams on Admission: DG Lumbar Spine Complete  Result Date: 12/09/2021 CLINICAL DATA:  Fall, bilateral hip pain, low back pain EXAM: LUMBAR SPINE - COMPLETE 4+ VIEW COMPARISON:  Prior examination of 11/18/2021 is unavailable for direct comparison. FINDINGS: 6 mm retrolisthesis of L2 upon L3. 3 mm retrolisthesis of L1 upon L2. There is a mild compression deformity of L1 with mild loss of height but no retropulsion identified. There is no paravertebral soft tissue swelling identified to suggest an acute fracture. Mild dextroscoliosis of the lumbar spine centered at L2. Intervertebral disc space narrowing and endplate remodeling at Y6-9 and L2-3 is in keeping with moderate to severe degenerative disc disease with exuberant left lateral disc osteophyte formation at L2-3. Remaining intervertebral disc heights are preserved. Remaining vertebral body heights are preserved. Paraspinal soft tissues are unremarkable. IMPRESSION: Remote appearing mild compression deformity of L1 without retropulsion. Advanced degenerative disc disease L1-2 and L2-3 with associated retrolisthesis at these levels. No acute fracture or traumatic listhesis of the lumbar spine. Electronically Signed   By: Fidela Salisbury M.D.   On: 12/09/2021 01:50   CT HEAD WO CONTRAST (5MM)  Result Date: 12/09/2021 CLINICAL DATA:  Head trauma.  Fall 12/08/2021 and 11/30/2021 EXAM: CT  HEAD WITHOUT CONTRAST TECHNIQUE: Contiguous axial images were obtained from the base of the skull through the vertex without intravenous contrast. COMPARISON:  MR head 09/29/2017.  CT head 04/08/2017 FINDINGS: Brain: Mild atrophy moderate diffuse white matter hypoattenuation has progressed bilaterally. 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 As ganglia are intact. No acute infarct, hemorrhage, or mass lesion is present. The ventricles are proportionate to the degree of atrophy. No significant extraaxial fluid collection is present. The brainstem and cerebellum are within normal limits. Vascular: Atherosclerotic calcifications are present within the cavernous internal carotid arteries bilaterally. No hyperdense vessel is present. Skull: Calvarium is intact. No focal lytic or blastic lesions are present. No significant extracranial soft tissue lesion is present. Sinuses/Orbits: The paranasal sinuses and mastoid air cells are clear. Bilateral lens replacements are noted. Globes and orbits are otherwise unremarkable. IMPRESSION: 1. Progressive atrophy and white matter disease. This likely reflects the sequela of chronic microvascular ischemia. 2. 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 3. No acute intracranial abnormality or significant interval change. No acute hemorrhage Electronically Signed   By: San Morelle M.D.   On: 12/09/2021 14:40   CT PELVIS WO CONTRAST  Result Date: 12/09/2021 CLINICAL DATA:  Hip trauma with fracture suspected EXAM: CT PELVIS WITHOUT CONTRAST TECHNIQUE: Multidetector CT imaging of the pelvis was performed following  the standard protocol without intravenous contrast. COMPARISON:  Preceding lumbar MRI FINDINGS: No visible fracture at the level of sacral edematous appearance on prior study. There is greater bony density at this level without out right sclerotic lesion. The adjacent attaching piriformis is  thicker than the left, this may be reactive changes from altered weight-bearing or healing remote trauma. No hip fracture or dislocation.  Left hip arthroplasty. Osteopenia and advanced lower lumbar spine degeneration. No acute soft tissue finding. Tensor fossa lata atrophy and fatty infiltration on the right. Atherosclerosis. Colonic diverticulosis. IMPRESSION: No acute finding. Electronically Signed   By: Jorje Guild M.D.   On: 12/09/2021 07:48   MR LUMBAR SPINE WO CONTRAST  Result Date: 12/09/2021 CLINICAL DATA:  Fall Christmas Eve with back pain EXAM: MRI LUMBAR SPINE WITHOUT CONTRAST TECHNIQUE: Multiplanar, multisequence MR imaging of the lumbar spine was performed. No intravenous contrast was administered. COMPARISON:  07/31/2017 lumbar FINDINGS: Segmentation:  5 lumbar type vertebrae Alignment: Grade 1 anterolisthesis at L4-5 and L5-S1. Mild retrolisthesis at L2-3. Vertebrae: L1 body compression fracture with inferior endplate depression. There is marrow edema around a horizontal fluid containing fracture cleft. Height loss measures up to 30% with mild retropulsion of the posteroinferior corner. Mild marrow edematous signal at the inferior right S1 level seen on sagittal images. Conus medullaris and cauda equina: Conus extends to the L2 level. Conus and cauda equina appear normal. Paraspinal and other soft tissues: No perispinal mass or inflammation. Disc levels: T12- L1: Unremarkable. L1-L2: Unremarkable. L2-L3: Disc collapse with endplate ridging and disc bulging eccentric to the left where there is also asymmetric facet spurring. Left foraminal impingement L3-L4: Mild disc bulging and facet spurring L4-L5: Facet spurring with anterolisthesis. Disc space narrowing and bulging with annular fissuring. L5-S1:Disc narrowing and bulging with annular fissure and facet spurring. Moderate right foraminal impingement. Right subarticular recess narrowing without static compression. IMPRESSION: 1. Acute L1  compression fracture with 30% height loss and mild, noncompressive retropulsion. 2. Partially covered edema in the lower right sacrum, question insufficiency fracture at this level. 3. Lumbar spine degeneration with foraminal impingement on the left at L2-3 and to a lesser extent on the right at L5-S1. Electronically Signed   By: Jorje Guild M.D.   On: 12/09/2021 04:11   DG Hip Unilat W or Wo Pelvis 2-3 Views Left  Result Date: 12/09/2021 CLINICAL DATA:  Fall, left hip pain EXAM: DG HIP (WITH OR WITHOUT PELVIS) 2-3V LEFT COMPARISON:  None. FINDINGS: Left hip bipolar hemiarthroplasty is in place. No acute fracture or dislocation. Vascular calcifications are noted within the medial left thigh. IMPRESSION: Negative. Electronically Signed   By: Fidela Salisbury M.D.   On: 12/09/2021 01:52   DG Hip Unilat W or Wo Pelvis 2-3 Views Right  Result Date: 12/09/2021 CLINICAL DATA:  Fall, bilateral hip pain EXAM: DG HIP (WITH OR WITHOUT PELVIS) 2-3V RIGHT COMPARISON:  None. FINDINGS: Normal alignment. No fracture or dislocation. Right hip joint space is preserved. Left hip bipolar hemiarthroplasty has been performed. IMPRESSION: Negative. Electronically Signed   By: Fidela Salisbury M.D.   On: 12/09/2021 01:51     EKG:  Not done in ED, will get one.   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 Rudene Christians of orthopedic surgery for possible kyphoplasty. -Admitted to MedSurg bed as inpatient -LSO -Pain control: As needed morphine, Percocet, Tylenol,  Lidoderm -As needed Robaxin  Fall: CT of head is negative for acute intracranial abnormalities. 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  Patient has only trace leg edema, no respiratory distress.  Does not seem to have CHF exacerbation. -Continue home Lasix -Check BNP --> 300  Depression with anxiety -Continue home medications  Normocytic anemia -Follow-up with CBC      DVT ppx: SQ Lovenox Code Status: DNR per her POA  Family Communication: Yes, patient's power of attorney by phone Disposition Plan:  Anticipate discharge back to previous environment Consults called:  Dr. Rudene Christians of ortho Admission status and Level of care: Med-Surg:   as inpt          Status is: Inpatient  Remains inpatient appropriate because: Patient has some multiple comorbidities, now presents with fall and closed compression fracture of body of L1 vertebra with 30% of vertebral height loss. Patient has severe pain, cannot ambulate.  Pending orthopedic surgeon's evaluation for possible kyphoplasty procedure.  Her presentation is highly complicated.  Patient is at high risk of deteriorating.  Will need to be treated in hospital for at least 2 days.          Date of Service 12/09/2021    Ivor Costa [facility]   If 7PM-7AM, please contact night-coverage [email or web address] 12/09/2021, 6:47 PM

## 2021-12-09 NOTE — ED Notes (Signed)
Living Will documents given to registration to scan.

## 2021-12-09 NOTE — Progress Notes (Signed)
Orthopedic Tech Progress Note Patient Details:  APPHIA CROPLEY 1946-07-05 [id number]  Called in order to [employer] for a LSO BACK BRACE  Patient ID: Erin [name], female   DOB: [birth year], 76 y.o.   MRN: [id number]  Janit Pagan 12/09/2021, 10:39 AM

## 2021-12-09 NOTE — Consult Note (Signed)
Reason for Consult: L1 compression fracture Referring Physician: Dr.Niu  Erin [name] is an 76 y.o. female.  HPI: Patient is a very fragile 11 year old who her cousin reports is fallen about 5 times in the past month 22 great deal of difficulty caring for self at home.  She uses a walker normally she had increasing pain after a fall 2 days ago and was brought to the emergency room where she was found to have a significant L1 compression fracture.  She has history of prior kyphoplasty's at T7-8 and 9 about 2 years ago.  She suffers from multiple long-term chronic medical problems and is essentially homebound.  Past Medical History:  Diagnosis Date   Anemia    h/o   Cancer ([place])    melanoma x 3the last one was of her leg and surgeon removed it   Colon polyp    Complication of anesthesia    PT STATED THAT WITH JOINT SURGERY THAT SHE WAS GIVEN SOME TYPE OF ANESTHESIA THAT MADE HER HALLUCINATE    Coronary artery calcification    Coronary artery disease    DDD (degenerative disc disease), lumbar    Diverticulosis 01/20/2017   Dysphagia    Dyspnea    with exertion-unable to walk a mile without getting sob- dr sparks set pt up to see Dr Rockey Situ after 11-19-16 surgery   Environmental allergies    Esophageal dysmotility    Fibrocystic breast disease    GERD (gastroesophageal reflux disease)    Heart murmur    asymptomatic   Hyperlipidemia    unspecified   Hypertension    Mitral regurgitation    Monilial esophagitis ([place]) 01/20/2017   Obstipation    Osteopenia    Peptic ulcer disease    Pulmonary fibrosis ([place])    per Dr Raul Del   RA (rheumatoid arthritis) ([place])    Raynaud's disease    Redundant colon 01/20/2017   Scleroderma ([place])    Telangiectasia of colon    Tubular adenoma of colon 01/20/2017   unspecified   Vulvar dysplasia     Past Surgical History:  Procedure Laterality Date   ABDOMINAL HYSTERECTOMY     BREAST CYST EXCISION Left 20+ years ago   No scar visible    BREAST LUMPECTOMY Right    CARDIAC CATHETERIZATION     CARPAL TUNNEL RELEASE Bilateral    COLON SURGERY     colon polyp    COLONOSCOPY  09/30/2011   tubular adenoma rtm   COLONOSCOPY     05/02/2003, 04/10/2000   COLONOSCOPY WITH PROPOFOL N/A 01/20/2017   Procedure: COLONOSCOPY WITH PROPOFOL;  Surgeon: Lollie Sails, MD;  Location: [facility] ENDOSCOPY;  Service: Endoscopy;  Laterality: N/A;   COLONOSCOPY WITH PROPOFOL N/A 09/17/2020   Procedure: COLONOSCOPY WITH PROPOFOL;  Surgeon: Lesly Rubenstein, MD;  Location: [facility] ENDOSCOPY;  Service: Endoscopy;  Laterality: N/A;   CORONARY ATHERECTOMY N/A 11/21/2020   Procedure: CORONARY ATHERECTOMY;  Surgeon: Wellington Hampshire, MD;  Location: [facility] LAB;  Service: Cardiovascular;  Laterality: N/A;   CORONARY STENT INTERVENTION  11/21/2020   CORONARY STENT INTERVENTION N/A 11/21/2020   Procedure: CORONARY STENT INTERVENTION;  Surgeon: Wellington Hampshire, MD;  Location: [facility];  Service: Cardiovascular;  Laterality: N/A;   ESOPHAGOGASTRODUODENOSCOPY     ESOPHAGOGASTRODUODENOSCOPY     09/30/2011, 04/10/2000 , no repeat rtm   ESOPHAGOGASTRODUODENOSCOPY N/A 03/02/2018   Procedure: ESOPHAGOGASTRODUODENOSCOPY (EGD);  Surgeon: Virgel Manifold, MD;  Location[facility] ENDOSCOPY;  Service: Endoscopy;  Laterality: N/A;   ESOPHAGOGASTRODUODENOSCOPY (EGD) WITH PROPOFOL N/A 01/20/2017   Procedure: ESOPHAGOGASTRODUODENOSCOPY (EGD) WITH PROPOFOL;  Surgeon: Lollie Sails, MD;  Location: [facility] ENDOSCOPY;  Service: Endoscopy;  Laterality: N/A;   ESOPHAGOGASTRODUODENOSCOPY (EGD) WITH PROPOFOL N/A 09/17/2020   Procedure: ESOPHAGOGASTRODUODENOSCOPY (EGD) WITH PROPOFOL;  Surgeon: Lesly Rubenstein, MD;  Location: [facility] ENDOSCOPY;  Service: Endoscopy;  Laterality: N/A;   EXCISION HYDRADENITIS LABIA Left 11/19/2016   Procedure: EXCISION LABIAL MASS;  Surgeon: Robert Bellow, MD;  Location: [facility] ORS;  Service: General;  Laterality: Left;   EYE SURGERY  Bilateral    cataracts   HEEL SPUR EXCISION     HIP ARTHROPLASTY Left 02/23/2018   Procedure: ARTHROPLASTY BIPOLAR HIP (HEMIARTHROPLASTY);  Surgeon: Thornton Park, MD;  Location: [facility] ORS;  Service: Orthopedics;  Laterality: Left;   KYPHOPLASTY N/A 04/26/2019   Procedure: KYPHOPLASTY T7;  Surgeon: Hessie Knows, MD;  Location: [facility] ORS;  Service: Orthopedics;  Laterality: N/A;   KYPHOPLASTY N/A 05/17/2019   Procedure: KYPHOPLASTY T8;  Surgeon: Hessie Knows, MD;  Location: [facility] ORS;  Service: Orthopedics;  Laterality: N/A;   KYPHOPLASTY N/A 07/26/2019   Procedure: T-9 KYPHOPLASTY;  Surgeon: Hessie Knows, MD;  Location: [facility] ORS;  Service: Orthopedics;  Laterality: N/A;   MASS EXCISION Left 11/19/2016   Procedure: EXCISION LEFT THIGH MELANOMA;  Surgeon: Robert Bellow, MD;  Location: [facility] ORS;  Service: General;  Laterality: Left;   RIGHT/LEFT HEART CATH AND CORONARY ANGIOGRAPHY N/A 11/15/2020   Procedure: RIGHT/LEFT HEART CATH AND CORONARY ANGIOGRAPHY poss intervention;  Surgeon: Minna Merritts, MD;  Location: [facility] LAB;  Service: Cardiovascular;  Laterality: N/A;   SENTINEL NODE BIOPSY Left 11/19/2016   Procedure: INGUINAL SENTINEL NODE BIOPSY;  Surgeon: Robert Bellow, MD;  Location: [facility] ORS;  Service: General;  Laterality: Left;   UPPER ESOPHAGEAL ENDOSCOPIC ULTRASOUND (EUS) N/A 01/29/2017   Procedure: UPPER ESOPHAGEAL ENDOSCOPIC ULTRASOUND (EUS);  Surgeon: Holly Bodily, MD;  Location: [facility] ENDOSCOPY;  Service: Endoscopy;  Laterality: N/A;    Family History  Problem Relation Age of Onset   Endometrial cancer Mother 54   Hypertension Mother    Osteoporosis Mother    Bladder Cancer Father    COPD Father    Cerebral palsy Sister     Social History:  reports that she has never smoked. She has never used smokeless tobacco. She reports that she does not drink alcohol and does not use drugs.  Allergies:  Allergies  Allergen Reactions   Remicade [Infliximab]  Shortness Of Breath and Itching   Sulfa Antibiotics Other (See Comments)    Other Reaction: tounge cracked   Actonel [Risedronate Sodium] Other (See Comments)   Fosamax [Alendronate Sodium]     GI Bleed    Procardia [Nifedipine] Hives   Elemental Sulfur Swelling   Wellbutrin [Bupropion] Anxiety    Medications: I have reviewed the patient's current medications.  Results for orders placed or performed during the hospital encounter of 12/09/21 (from the past 48 hour(s))  CBC with Differential     Status: Abnormal   Collection Time: 12/09/21  5:12 AM  Result Value Ref Range   WBC 14.6 (H) 4.0 - 10.5 K/uL   RBC 3.48 (L) 3.87 - 5.11 MIL/uL   Hemoglobin 9.6 (L) 12.0 - 15.0 g/dL   HCT 29.9 (L) 36.0 - 46.0 %   MCV 85.9 80.0 - 100.0 fL   MCH 27.6 26.0 - 34.0 pg   MCHC 32.1 30.0 - 36.0 g/dL   RDW  16.5 (H) 11.5 - 15.5 %   Platelets 353 150 - 400 K/uL   nRBC 0.0 0.0 - 0.2 %   Neutrophils Relative % 78 %   Neutro Abs 11.4 (H) 1.7 - 7.7 K/uL   Lymphocytes Relative 9 %   Lymphs Abs 1.3 0.7 - 4.0 K/uL   Monocytes Relative 10 %   Monocytes Absolute 1.5 (H) 0.1 - 1.0 K/uL   Eosinophils Relative 1 %   Eosinophils Absolute 0.2 0.0 - 0.5 K/uL   Basophils Relative 1 %   Basophils Absolute 0.1 0.0 - 0.1 K/uL   Immature Granulocytes 1 %   Abs Immature Granulocytes 0.09 (H) 0.00 - 0.07 K/uL    Comment: Performed at [facility], [address] [place], [place] [address]  Comprehensive metabolic panel     Status: Abnormal   Collection Time: 12/09/21  5:12 AM  Result Value Ref Range   Sodium 132 (L) 135 - 145 mmol/L   Potassium 4.1 3.5 - 5.1 mmol/L   Chloride 99 98 - 111 mmol/L   CO2 24 22 - 32 mmol/L   Glucose, Bld 91 70 - 99 mg/dL    Comment: Glucose reference range applies only to samples taken after fasting for at least 8 hours.   BUN 12 8 - 23 mg/dL   Creatinine, Ser 0.76 0.44 - 1.00 mg/dL   Calcium 8.4 (L) 8.9 - 10.3 mg/dL   Total Protein 5.9 (L) 6.5 - 8.1 g/dL   Albumin  2.7 (L) 3.5 - 5.0 g/dL   AST 23 15 - 41 U/L   ALT 18 0 - 44 U/L   Alkaline Phosphatase 91 38 - 126 U/L   Total Bilirubin 0.7 0.3 - 1.2 mg/dL   GFR, Estimated >60 >60 mL/min    Comment: (NOTE) Calculated using the CKD-EPI Creatinine Equation (2021)    Anion gap 9 5 - 15    Comment: Performed at [facility], [address] [place], [place] [address]  Resp Panel by RT-PCR (Flu A&B, Covid) Nasopharyngeal Swab     Status: None   Collection Time: 12/09/21  5:12 AM   Specimen: Nasopharyngeal Swab; Nasopharyngeal(NP) swabs in vial transport medium  Result Value Ref Range   SARS Coronavirus 2 by RT PCR NEGATIVE NEGATIVE    Comment: (NOTE) SARS-CoV-2 target nucleic acids are NOT DETECTED.  The SARS-CoV-2 RNA is generally detectable in upper respiratory specimens during the acute phase of infection. The lowest concentration of SARS-CoV-2 viral copies this assay can detect is 138 copies/mL. A negative result does not preclude SARS-Cov-2 infection and should not be used as the sole basis for treatment or other patient management decisions. A negative result may occur with  improper specimen collection/handling, submission of specimen other than nasopharyngeal swab, presence of viral mutation(s) within the areas targeted by this assay, and inadequate number of viral copies(<138 copies/mL). A negative result must be combined with clinical observations, patient history, and epidemiological information. The expected result is Negative.  Fact Sheet for Patients:  EntrepreneurPulse.com.au  Fact Sheet for Healthcare Providers:  IncredibleEmployment.be  This test is no t yet approved or cleared by the [place] FDA and  has been authorized for detection and/or diagnosis of SARS-CoV-2 by FDA under an Emergency Use Authorization (EUA). This EUA will remain  in effect (meaning this test can be used) for the duration of the COVID-19 declaration  under Section 564(b)(1) of the Act, 21 U.S.C.section 360bbb-3(b)(1), unless the authorization is terminated  or revoked sooner.  Influenza A by PCR NEGATIVE NEGATIVE   Influenza B by PCR NEGATIVE NEGATIVE    Comment: (NOTE) The Xpert Xpress SARS-CoV-2/FLU/RSV plus assay is intended as an aid in the diagnosis of influenza from Nasopharyngeal swab specimens and should not be used as a sole basis for treatment. Nasal washings and aspirates are unacceptable for Xpert Xpress SARS-CoV-2/FLU/RSV testing.  Fact Sheet for Patients: EntrepreneurPulse.com.au  Fact Sheet for Healthcare Providers: IncredibleEmployment.be  This test is not yet approved or cleared by the [place] FDA and has been authorized for detection and/or diagnosis of SARS-CoV-2 by FDA under an Emergency Use Authorization (EUA). This EUA will remain in effect (meaning this test can be used) for the duration of the COVID-19 declaration under Section 564(b)(1) of the Act, 21 U.S.C. section 360bbb-3(b)(1), unless the authorization is terminated or revoked.  Performed at [facility], [address] [place], [place] [address]   Brain natriuretic peptide     Status: Abnormal   Collection Time: 12/09/21  5:12 AM  Result Value Ref Range   B Natriuretic Peptide 300.3 (H) 0.0 - 100.0 pg/mL    Comment: Performed at [facility], [address] [place], [place] [address]  Protime-INR     Status: Abnormal   Collection Time: 12/09/21 10:33 AM  Result Value Ref Range   Prothrombin Time 15.4 (H) 11.4 - 15.2 seconds   INR 1.2 0.8 - 1.2    Comment: (NOTE) INR goal varies based on device and disease states. Performed at [facility], [address] [place], [place] [address]   APTT     Status: None   Collection Time: 12/09/21 10:33 AM  Result Value Ref Range   aPTT 32 24 - 36 seconds    Comment: Performed at [facility], [address] [place], [place] [address]    DG Lumbar Spine Complete  Result Date: 12/09/2021 CLINICAL DATA:  Fall, bilateral hip pain, low back pain EXAM: LUMBAR SPINE - COMPLETE 4+ VIEW COMPARISON:  Prior examination of 11/18/2021 is unavailable for direct comparison. FINDINGS: 6 mm retrolisthesis of L2 upon L3. 3 mm retrolisthesis of L1 upon L2. There is a mild compression deformity of L1 with mild loss of height but no retropulsion identified. There is no paravertebral soft tissue swelling identified to suggest an acute fracture. Mild dextroscoliosis of the lumbar spine centered at L2. Intervertebral disc space narrowing and endplate remodeling at S3-4 and L2-3 is in keeping with moderate to severe degenerative disc disease with exuberant left lateral disc osteophyte formation at L2-3. Remaining intervertebral disc heights are preserved. Remaining vertebral body heights are preserved. Paraspinal soft tissues are unremarkable. IMPRESSION: Remote appearing mild compression deformity of L1 without retropulsion. Advanced degenerative disc disease L1-2 and L2-3 with associated retrolisthesis at these levels. No acute fracture or traumatic listhesis of the lumbar spine. Electronically Signed   By: Fidela Salisbury M.D.   On: 12/09/2021 01:50   CT PELVIS WO CONTRAST  Result Date: 12/09/2021 CLINICAL DATA:  Hip trauma with fracture suspected EXAM: CT PELVIS WITHOUT CONTRAST TECHNIQUE: Multidetector CT imaging of the pelvis was performed following the standard protocol without intravenous contrast. COMPARISON:  Preceding lumbar MRI FINDINGS: No visible fracture at the level of sacral edematous appearance on prior study. There is greater bony density at this level without out right sclerotic lesion. The adjacent attaching piriformis is thicker than the left, this may be reactive changes from altered weight-bearing or healing remote trauma. No hip fracture or dislocation.  Left hip arthroplasty. Osteopenia and advanced lower lumbar  spine degeneration. No acute soft tissue finding. Tensor fossa lata atrophy and fatty infiltration on the right. Atherosclerosis. Colonic diverticulosis. IMPRESSION: No acute finding. Electronically Signed   By: Jorje Guild M.D.   On: 12/09/2021 07:48   MR LUMBAR SPINE WO CONTRAST  Result Date: 12/09/2021 CLINICAL DATA:  Fall Christmas Eve with back pain EXAM: MRI LUMBAR SPINE WITHOUT CONTRAST TECHNIQUE: Multiplanar, multisequence MR imaging of the lumbar spine was performed. No intravenous contrast was administered. COMPARISON:  07/31/2017 lumbar FINDINGS: Segmentation:  5 lumbar type vertebrae Alignment: Grade 1 anterolisthesis at L4-5 and L5-S1. Mild retrolisthesis at L2-3. Vertebrae: L1 body compression fracture with inferior endplate depression. There is marrow edema around a horizontal fluid containing fracture cleft. Height loss measures up to 30% with mild retropulsion of the posteroinferior corner. Mild marrow edematous signal at the inferior right S1 level seen on sagittal images. Conus medullaris and cauda equina: Conus extends to the L2 level. Conus and cauda equina appear normal. Paraspinal and other soft tissues: No perispinal mass or inflammation. Disc levels: T12- L1: Unremarkable. L1-L2: Unremarkable. L2-L3: Disc collapse with endplate ridging and disc bulging eccentric to the left where there is also asymmetric facet spurring. Left foraminal impingement L3-L4: Mild disc bulging and facet spurring L4-L5: Facet spurring with anterolisthesis. Disc space narrowing and bulging with annular fissuring. L5-S1:Disc narrowing and bulging with annular fissure and facet spurring. Moderate right foraminal impingement. Right subarticular recess narrowing without static compression. IMPRESSION: 1. Acute L1 compression fracture with 30% height loss and mild, noncompressive retropulsion. 2. Partially covered edema in the lower right sacrum, question insufficiency fracture at this level. 3. Lumbar spine  degeneration with foraminal impingement on the left at L2-3 and to a lesser extent on the right at L5-S1. Electronically Signed   By: Jorje Guild M.D.   On: 12/09/2021 04:11   DG Hip Unilat W or Wo Pelvis 2-3 Views Left  Result Date: 12/09/2021 CLINICAL DATA:  Fall, left hip pain EXAM: DG HIP (WITH OR WITHOUT PELVIS) 2-3V LEFT COMPARISON:  None. FINDINGS: Left hip bipolar hemiarthroplasty is in place. No acute fracture or dislocation. Vascular calcifications are noted within the medial left thigh. IMPRESSION: Negative. Electronically Signed   By: Fidela Salisbury M.D.   On: 12/09/2021 01:52   DG Hip Unilat W or Wo Pelvis 2-3 Views Right  Result Date: 12/09/2021 CLINICAL DATA:  Fall, bilateral hip pain EXAM: DG HIP (WITH OR WITHOUT PELVIS) 2-3V RIGHT COMPARISON:  None. FINDINGS: Normal alignment. No fracture or dislocation. Right hip joint space is preserved. Left hip bipolar hemiarthroplasty has been performed. IMPRESSION: Negative. Electronically Signed   By: Fidela Salisbury M.D.   On: 12/09/2021 01:51    Review of Systems Blood pressure (!) 106/59, pulse 82, temperature 97.7 F (36.5 C), resp. rate 16, height 5' (1.524 m), weight 70.8 kg, SpO2 95 %. Physical Exam She is tender palpation at L1.  She has no clonus.  AHas trace patellar reflexes bilaterally absent Achilles jerks skin is intact in the back.  Able to flex extend toes Assessment/Plan: L1 compression fracture with significant pain and limitation of motion. Recommendation is for interventional radiology consult for possible vertebroplasty or kyphoplasty.  There may also be sacral insufficiency fracture but that does not seem to be the area of most of her pain.  Hessie Knows 12/09/2021, 1:33 PM

## 2021-12-09 NOTE — ED Triage Notes (Signed)
Pt arrived via [facility] from home where she hit her life alert after falling tonight when she attempted to walk with walker. Pt reports that she fell on 12/24 and since has had back pain and hip pain that have worsened as well as prevented her from ambulating without pain and weakness to lower extremities. Pt reports she has been laying in the bed since the 12/24 fall due to pain. Pt denies hitting head as well as LOC with both falls.

## 2021-12-09 NOTE — Plan of Care (Signed)
°  Problem: Education: Goal: Knowledge of General Education information will improve Description: Including pain rating scale, medication(s)/side effects and non-pharmacologic comfort measures Outcome: Progressing   Problem: Health Behavior/Discharge Planning: Goal: Ability to manage health-related needs will improve Outcome: Progressing   Problem: Clinical Measurements: Goal: Ability to maintain clinical measurements within normal limits will improve Outcome: Progressing Goal: Will remain free from infection Outcome: Progressing Goal: Diagnostic test results will improve Outcome: Progressing Goal: Respiratory complications will improve Outcome: Progressing Goal: Cardiovascular complication will be avoided Outcome: Progressing   Problem: Coping: Goal: Level of anxiety will decrease Outcome: Progressing   Problem: Nutrition: Goal: Adequate nutrition will be maintained Outcome: Progressing   Problem: Activity: Goal: Risk for activity intolerance will decrease Outcome: Progressing   Problem: Elimination: Goal: Will not experience complications related to bowel motility Outcome: Progressing Goal: Will not experience complications related to urinary retention Outcome: Progressing   Problem: Pain Managment: Goal: General experience of comfort will improve Outcome: Progressing   Problem: Safety: Goal: Ability to remain free from injury will improve Outcome: Progressing   Problem: Skin Integrity: Goal: Risk for impaired skin integrity will decrease Outcome: Progressing

## 2021-12-09 NOTE — ED Notes (Signed)
2 SST sent to lab with save labels.

## 2021-12-09 NOTE — ED Notes (Signed)
Rodman Key RN aware of bed assignment via secure chat

## 2021-12-09 NOTE — ED Provider Notes (Signed)
[name] Provider Note    Event Date/Time   First MD Initiated Contact with Patient 12/09/21 [phone]     (approximate)   History   Fall   HPI  Erin [name] is a 76 y.o. female with a history of osteopenia, scleroderma, rheumatoid arthritis, anemia, hypertension, hyperlipidemia, [name] on Plavix who presents after a mechanical fall.  Patient is accompanied by her priest who is also her healthcare power of attorney.  Patient reports that she had a fall 9 days ago on Christmas eve and since then has had back pain and bilateral hip pain.  She reports that the pain has been getting progressively worse to the point that over the last 24 hours she has been unable to ambulate.  She reports that she has been laying in bed most of the days only getting up to go to the bathroom since her fall.  Today while trying to get up she had another fall and was unable to get up.  Patient is unable to ambulate.  She is complaining of severe lower back pain radiating across to both her hips.  She denies head trauma or LOC.  She denies neck pain.  She denies any recent illnesses including cough, congestion, fever, vomiting, diarrhea, dysuria.     Physical Exam   Triage Vital Signs: ED Triage Vitals [12/09/21 0043]  Enc Vitals Group     BP (!) 150/71     Pulse Rate 86     Resp 18     Temp 98.1 F (36.7 C)     Temp Source Oral     SpO2 97 %     Weight      Height      Head Circumference      Peak Flow      Pain Score      Pain Loc      Pain Edu?      Excl. in [place]?     Most recent vital signs: Vitals:   12/09/21 0043 12/09/21 0502  BP: (!) 150/71 (!) 144/74  Pulse: 86 84  Resp: 18   Temp: 98.1 F (36.7 C) 99 F (37.2 C)  SpO2: 97% 93%     General: Awake, no distress.  Neck:  No midline C-spine tenderness CV:  RRR, no murmurs, strong pulses x 4 Resp:  Normal effort. Lungs clear to auscultation Abd:  Soft, non tender, non distended, positive bowel sounds, no  rebound or guarding MSK:  No edema or cyanosis.  Full painless range of motion of all joints.  Patient is tender to palpation midline in the lumbar spine Neuro:  Normal speech, face is symmetric, moving all extremities with no gross focal neuro deficit   ED Results / Procedures / Treatments   Labs (all labs ordered are listed, but only abnormal results are displayed) Labs Reviewed  CBC WITH DIFFERENTIAL/PLATELET - Abnormal; Notable for the following components:      Result Value   WBC 14.6 (*)    RBC 3.48 (*)    Hemoglobin 9.6 (*)    HCT 29.9 (*)    RDW 16.5 (*)    Neutro Abs 11.4 (*)    Monocytes Absolute 1.5 (*)    Abs Immature Granulocytes 0.09 (*)    All other components within normal limits  COMPREHENSIVE METABOLIC PANEL - Abnormal; Notable for the following components:   Sodium 132 (*)    Calcium 8.4 (*)    Total Protein 5.9 (*)  Albumin 2.7 (*)    All other components within normal limits  RESP PANEL BY RT-PCR (FLU A&B, COVID) [id number]     EKG  none   RADIOLOGY I have personally reviewed the images performed during this visit and I agree with the Radiologist's read.   Interpretation by Radiologist:  DG Lumbar Spine Complete  Result Date: 12/09/2021 CLINICAL DATA:  Fall, bilateral hip pain, low back pain EXAM: LUMBAR SPINE - COMPLETE 4+ VIEW COMPARISON:  Prior examination of 11/18/2021 is unavailable for direct comparison. FINDINGS: 6 mm retrolisthesis of L2 upon L3. 3 mm retrolisthesis of L1 upon L2. There is a mild compression deformity of L1 with mild loss of height but no retropulsion identified. There is no paravertebral soft tissue swelling identified to suggest an acute fracture. Mild dextroscoliosis of the lumbar spine centered at L2. Intervertebral disc space narrowing and endplate remodeling at J0-3 and L2-3 is in keeping with moderate to severe degenerative disc disease with exuberant left lateral disc osteophyte formation at L2-3. Remaining intervertebral  disc heights are preserved. Remaining vertebral body heights are preserved. Paraspinal soft tissues are unremarkable. IMPRESSION: Remote appearing mild compression deformity of L1 without retropulsion. Advanced degenerative disc disease L1-2 and L2-3 with associated retrolisthesis at these levels. No acute fracture or traumatic listhesis of the lumbar spine. Electronically Signed   By: Fidela Salisbury M.D.   On: 12/09/2021 01:50   MR LUMBAR SPINE WO CONTRAST  Result Date: 12/09/2021 CLINICAL DATA:  Fall Christmas Eve with back pain EXAM: MRI LUMBAR SPINE WITHOUT CONTRAST TECHNIQUE: Multiplanar, multisequence MR imaging of the lumbar spine was performed. No intravenous contrast was administered. COMPARISON:  07/31/2017 lumbar FINDINGS: Segmentation:  5 lumbar type vertebrae Alignment: Grade 1 anterolisthesis at L4-5 and L5-S1. Mild retrolisthesis at L2-3. Vertebrae: L1 body compression fracture with inferior endplate depression. There is marrow edema around a horizontal fluid containing fracture cleft. Height loss measures up to 30% with mild retropulsion of the posteroinferior corner. Mild marrow edematous signal at the inferior right S1 level seen on sagittal images. Conus medullaris and cauda equina: Conus extends to the L2 level. Conus and cauda equina appear normal. Paraspinal and other soft tissues: No perispinal mass or inflammation. Disc levels: T12- L1: Unremarkable. L1-L2: Unremarkable. L2-L3: Disc collapse with endplate ridging and disc bulging eccentric to the left where there is also asymmetric facet spurring. Left foraminal impingement L3-L4: Mild disc bulging and facet spurring L4-L5: Facet spurring with anterolisthesis. Disc space narrowing and bulging with annular fissuring. L5-S1:Disc narrowing and bulging with annular fissure and facet spurring. Moderate right foraminal impingement. Right subarticular recess narrowing without static compression. IMPRESSION: 1. Acute L1 compression fracture with  30% height loss and mild, noncompressive retropulsion. 2. Partially covered edema in the lower right sacrum, question insufficiency fracture at this level. 3. Lumbar spine degeneration with foraminal impingement on the left at L2-3 and to a lesser extent on the right at L5-S1. Electronically Signed   By: Jorje Guild M.D.   On: 12/09/2021 04:11   DG Hip Unilat W or Wo Pelvis 2-3 Views Left  Result Date: 12/09/2021 CLINICAL DATA:  Fall, left hip pain EXAM: DG HIP (WITH OR WITHOUT PELVIS) 2-3V LEFT COMPARISON:  None. FINDINGS: Left hip bipolar hemiarthroplasty is in place. No acute fracture or dislocation. Vascular calcifications are noted within the medial left thigh. IMPRESSION: Negative. Electronically Signed   By: Fidela Salisbury M.D.   On: 12/09/2021 01:52   DG Hip Unilat W or Wo Pelvis 2-3 Views Right  Result Date: 12/09/2021 CLINICAL DATA:  Fall, bilateral hip pain EXAM: DG HIP (WITH OR WITHOUT PELVIS) 2-3V RIGHT COMPARISON:  None. FINDINGS: Normal alignment. No fracture or dislocation. Right hip joint space is preserved. Left hip bipolar hemiarthroplasty has been performed. IMPRESSION: Negative. Electronically Signed   By: Fidela Salisbury M.D.   On: 12/09/2021 01:51      PROCEDURES:  Critical Care performed: No  Procedures   MEDICATIONS ORDERED IN ED: Medications  fentaNYL (SUBLIMAZE) injection 25 mcg (has no administration in time range)  acetaminophen (TYLENOL) tablet 1,000 mg (1,000 mg Oral Given 12/09/21 0409)  oxyCODONE (Oxy IR/ROXICODONE) immediate release tablet 5 mg (5 mg Oral Given 12/09/21 0410)     IMPRESSION / MDM / ASSESSMENT AND PLAN / ED COURSE  I reviewed the triage vital signs and the nursing notes.  76 y.o. female with a history of osteopenia, scleroderma, rheumatoid arthritis, anemia, hypertension, hyperlipidemia, [name] on Plavix who presents after a mechanical fall 8 days ago and now another fall due to severe and progressively worsening low back pain.  Patient is  tender to palpation in the lumbar region, has full painless range of motion of all joints in bilateral lower extremities.  Head is atraumatic with no midline C-spine tenderness.  Patient denies any head trauma.  X-ray of the lumbar spine was concerning for a compression fracture of L1 therefore patient was sent for an MRI.  The MRI shows acute L1 compression fracture with 30% height loss and mild noncompressive retropulsion.  Also raises possible concern of a sacrum fracture.  X-ray of the hips and pelvis were negative but will send patient for a CT of the pelvis for further evaluation of the sacrum and the pelvis.  Patient is in pretty significant pain and able to ambulate.  She lives alone therefore she is unsafe for discharge at this time.  We will consult the hospitalist for admission for PT, OT, possible rehab placement, LSO brace, pain control.  Patient has no neurodeficits or signs of cauda equina.  Basic lab work for admission has been done and shows slightly worsening anemia over the last several months.  Patient is on Plavix and will need further evaluation for this finding.  No significant electrolyte derangements.  Patient is placed on telemetry for close monitoring of cardiorespiratory status.  We will consult the hospitalist for admission.  EMR reviewed including patient's most recent visit with her rheumatologist for chronic midline low back pain        FINAL CLINICAL IMPRESSION(S) / ED DIAGNOSES   Final diagnoses:  Fall, initial encounter  Compression fracture of L1 vertebra, initial encounter ([place])     Rx / DC Orders   ED Discharge Orders     None        Note:  This document was prepared using Dragon voice recognition software and may include unintentional dictation errors.    Alfred Levins, [place], MD 12/09/21 [phone]

## 2021-12-10 ENCOUNTER — Encounter: Payer: Self-pay | Admitting: Internal Medicine

## 2021-12-10 DIAGNOSIS — S32010A Wedge compression fracture of first lumbar vertebra, initial encounter for closed fracture: Secondary | ICD-10-CM | POA: Diagnosis not present

## 2021-12-10 LAB — CBC
HCT: 32.6 % — ABNORMAL LOW (ref 36.0–46.0)
HCT: 31.9 % — ABNORMAL LOW (ref 36.0–46.0)
Hemoglobin: 10.3 g/dL — ABNORMAL LOW (ref 12.0–15.0)
Hemoglobin: 10.3 g/dL — ABNORMAL LOW (ref 12.0–15.0)
MCH: 27.3 pg (ref 26.0–34.0)
MCH: 27.6 pg (ref 26.0–34.0)
MCHC: 31.6 g/dL (ref 30.0–36.0)
MCHC: 32.3 g/dL (ref 30.0–36.0)
MCV: 86.5 fL (ref 80.0–100.0)
MCV: 85.5 fL (ref 80.0–100.0)
Platelets: 371 10*3/uL (ref 150–400)
Platelets: 379 10*3/uL (ref 150–400)
RBC: 3.77 MIL/uL — ABNORMAL LOW (ref 3.87–5.11)
RBC: 3.73 MIL/uL — ABNORMAL LOW (ref 3.87–5.11)
RDW: 16.6 % — ABNORMAL HIGH (ref 11.5–15.5)
RDW: 16.5 % — ABNORMAL HIGH (ref 11.5–15.5)
WBC: 10.6 10*3/uL — ABNORMAL HIGH (ref 4.0–10.5)
WBC: 10.1 10*3/uL (ref 4.0–10.5)
nRBC: 0 % (ref 0.0–0.2)
nRBC: 0 % (ref 0.0–0.2)

## 2021-12-10 LAB — BASIC METABOLIC PANEL
Anion gap: 11 (ref 5–15)
BUN: 14 mg/dL (ref 8–23)
CO2: 19 mmol/L — ABNORMAL LOW (ref 22–32)
Calcium: 8 mg/dL — ABNORMAL LOW (ref 8.9–10.3)
Chloride: 99 mmol/L (ref 98–111)
Creatinine, Ser: 0.79 mg/dL (ref 0.44–1.00)
GFR, Estimated: >60 mL/min (ref >60)
Glucose, Bld: 79 mg/dL (ref 70–99)
Potassium: 3.9 mmol/L (ref 3.5–5.1)
Sodium: 129 mmol/L — ABNORMAL LOW (ref 135–145)

## 2021-12-10 LAB — PHOSPHORUS: Phosphorus: 5.4 mg/dL — ABNORMAL HIGH (ref 2.5–4.6)

## 2021-12-10 LAB — MAGNESIUM: Magnesium: 2 mg/dL (ref 1.7–2.4)

## 2021-12-10 MED ORDER — PREDNISONE 10 MG PO TABS: 5.0000 mg | ORAL_TABLET | Freq: Once | ORAL | Status: AC

## 2021-12-10 MED ORDER — HYDROXYCHLOROQUINE SULFATE 200 MG PO TABS
200.0000 mg | ORAL_TABLET | ORAL | Status: DC
  Administered 2021-12-12 – 2021-12-18 (×4): 200 mg via ORAL
  Filled 2021-12-10 (×5): qty 1

## 2021-12-10 MED ORDER — HYDROXYCHLOROQUINE SULFATE 200 MG PO TABS
200.0000 mg | ORAL_TABLET | Freq: Every day | ORAL | Status: DC
  Administered 2021-12-11 – 2021-12-19 (×9): 200 mg via ORAL
  Filled 2021-12-10 (×9): qty 1

## 2021-12-10 MED ORDER — FUROSEMIDE 20 MG PO TABS
20.0000 mg | ORAL_TABLET | ORAL | Status: DC
  Administered 2021-12-11 – 2021-12-19 (×5): 20 mg via ORAL
  Filled 2021-12-10 (×7): qty 1

## 2021-12-10 MED ORDER — BISACODYL 5 MG PO TBEC: 5.0000 mg | DELAYED_RELEASE_TABLET | Freq: Every day | ORAL | Status: DC | PRN

## 2021-12-10 NOTE — Consult Note (Addendum)
Chief Complaint: Patient was seen in consultation today for uncontrolled back pain, symptomatic L1 compression fracture at the request of Dr. Rudene Christians  Referring Physician(s): Dr. Rudene Christians  Supervising Physician: Juliet Rude  Patient Status: [facility] - In-pt  History of Present Illness: Erin [name] is a 76 y.o. female with PMHx significant for rheumatoid arthritis, scleroderma, osteopenia with prior vertebral augmentation at thoracic 7, 8, 9 in 2020 by Dr. Rudene Christians who is admitted currently secondary to low back pain after recent falls. The patient currently lives at home and performs her ADL's fairly independently and with hired assistance who comes in every couple weeks. She uses a walker to ambulate and has had 6 recent falls in the past couple weeks. She presented to the ED 1/2 with complaints of uncontrolled back pain and hip pain.   Imaging was performed and revealed findings of acute L1 compression fracture with 30% height loss and mild, non compressive retropulsion. Hip imaging without fracture. Dr. Rudene Christians has seen the patient and requested IR evaluation for possible kyphoplasty. Patient states she has mid to lower back pain which is new pain for her since the fall on 12/24, no new changes in neurological function, she denies any new loss of bowel or bladder function. She does complain of chronic urinary incontinence at times however there is no new change in this. She rated her back pain 9/10 when she presented to the ED 1/2 and has been treated with Percocet, morphine, Robaxin and lidocaine patch. She states at home she takes hydrocodone only for her chronic upper back pain, she does admit the pain medication is helping but still rating her lower back pain 6/10 and uncontrolled, she states the back brace helps some but unable to tolerate for long periods of time.   The patient denies any current chest pain or any changes in her chronic shortness of breath. She states her last dose of plavix was  1/1 on Sunday morning. The patient denies any recent infections, fever or chills. The patient denies any history of sleep apnea or chronic oxygen use. She has no known complications to moderate sedation.    Past Medical History:  Diagnosis Date   Anemia    h/o   Cancer ([place])    melanoma x 3the last one was of her leg and surgeon removed it   Colon polyp    Complication of anesthesia    PT STATED THAT WITH JOINT SURGERY THAT SHE WAS GIVEN SOME TYPE OF ANESTHESIA THAT MADE HER HALLUCINATE    Coronary artery calcification    Coronary artery disease    DDD (degenerative disc disease), lumbar    Diverticulosis 01/20/2017   Dysphagia    Dyspnea    with exertion-unable to walk a mile without getting sob- dr sparks set pt up to see Dr Rockey Situ after 11-19-16 surgery   Environmental allergies    Esophageal dysmotility    Fibrocystic breast disease    GERD (gastroesophageal reflux disease)    Heart murmur    asymptomatic   Hyperlipidemia    unspecified   Hypertension    Mitral regurgitation    Monilial esophagitis ([place]) 01/20/2017   Obstipation    Osteopenia    Peptic ulcer disease    Pulmonary fibrosis ([place])    per Dr Raul Del   RA (rheumatoid arthritis) ([place])    Raynaud's disease    Redundant colon 01/20/2017   Scleroderma ([place])    Telangiectasia of colon    Tubular adenoma of colon  01/20/2017   unspecified   Vulvar dysplasia     Past Surgical History:  Procedure Laterality Date   ABDOMINAL HYSTERECTOMY     BREAST CYST EXCISION Left 20+ years ago   No scar visible   BREAST LUMPECTOMY Right    CARDIAC CATHETERIZATION     CARPAL TUNNEL RELEASE Bilateral    COLON SURGERY     colon polyp    COLONOSCOPY  09/30/2011   tubular adenoma rtm   COLONOSCOPY     05/02/2003, 04/10/2000   COLONOSCOPY WITH PROPOFOL N/A 01/20/2017   Procedure: COLONOSCOPY WITH PROPOFOL;  Surgeon: Lollie Sails, MD;  Location: [facility] ENDOSCOPY;  Service: Endoscopy;  Laterality: N/A;   COLONOSCOPY WITH  PROPOFOL N/A 09/17/2020   Procedure: COLONOSCOPY WITH PROPOFOL;  Surgeon: Lesly Rubenstein, MD;  Location: [facility] ENDOSCOPY;  Service: Endoscopy;  Laterality: N/A;   CORONARY ATHERECTOMY N/A 11/21/2020   Procedure: CORONARY ATHERECTOMY;  Surgeon: Wellington Hampshire, MD;  Location: [place] CV LAB;  Service: Cardiovascular;  Laterality: N/A;   CORONARY STENT INTERVENTION  11/21/2020   CORONARY STENT INTERVENTION N/A 11/21/2020   Procedure: CORONARY STENT INTERVENTION;  Surgeon: Wellington Hampshire, MD;  Location: [place] CV LAB;  Service: Cardiovascular;  Laterality: N/A;   ESOPHAGOGASTRODUODENOSCOPY     ESOPHAGOGASTRODUODENOSCOPY     09/30/2011, 04/10/2000 , no repeat rtm   ESOPHAGOGASTRODUODENOSCOPY N/A 03/02/2018   Procedure: ESOPHAGOGASTRODUODENOSCOPY (EGD);  Surgeon: Virgel Manifold, MD;  Location: [facility] ENDOSCOPY;  Service: Endoscopy;  Laterality: N/A;   ESOPHAGOGASTRODUODENOSCOPY (EGD) WITH PROPOFOL N/A 01/20/2017   Procedure: ESOPHAGOGASTRODUODENOSCOPY (EGD) WITH PROPOFOL;  Surgeon: Lollie Sails, MD;  Location: [facility] ENDOSCOPY;  Service: Endoscopy;  Laterality: N/A;   ESOPHAGOGASTRODUODENOSCOPY (EGD) WITH PROPOFOL N/A 09/17/2020   Procedure: ESOPHAGOGASTRODUODENOSCOPY (EGD) WITH PROPOFOL;  Surgeon: Lesly Rubenstein, MD;  Location: [facility] ENDOSCOPY;  Service: Endoscopy;  Laterality: N/A;   EXCISION HYDRADENITIS LABIA Left 11/19/2016   Procedure: EXCISION LABIAL MASS;  Surgeon: Robert Bellow, MD;  Location: [facility] ORS;  Service: General;  Laterality: Left;   EYE SURGERY Bilateral    cataracts   HEEL SPUR EXCISION     HIP ARTHROPLASTY Left 02/23/2018   Procedure: ARTHROPLASTY BIPOLAR HIP (HEMIARTHROPLASTY);  Surgeon: Thornton Park, MD;  Location: [facility] ORS;  Service: Orthopedics;  Laterality: Left;   KYPHOPLASTY N/A 04/26/2019   Procedure: KYPHOPLASTY T7;  Surgeon: Hessie Knows, MD;  Location: [facility] ORS;  Service: Orthopedics;  Laterality: N/A;   KYPHOPLASTY N/A 05/17/2019    Procedure: KYPHOPLASTY T8;  Surgeon: Hessie Knows, MD;  Location: [facility] ORS;  Service: Orthopedics;  Laterality: N/A;   KYPHOPLASTY N/A 07/26/2019   Procedure: T-9 KYPHOPLASTY;  Surgeon: Hessie Knows, MD;  Location: [facility] ORS;  Service: Orthopedics;  Laterality: N/A;   MASS EXCISION Left 11/19/2016   Procedure: EXCISION LEFT THIGH MELANOMA;  Surgeon: Robert Bellow, MD;  Location: [facility] ORS;  Service: General;  Laterality: Left;   RIGHT/LEFT HEART CATH AND CORONARY ANGIOGRAPHY N/A 11/15/2020   Procedure: RIGHT/LEFT HEART CATH AND CORONARY ANGIOGRAPHY poss intervention;  Surgeon: Minna Merritts, MD;  Location: [place] CV LAB;  Service: Cardiovascular;  Laterality: N/A;   SENTINEL NODE BIOPSY Left 11/19/2016   Procedure: INGUINAL SENTINEL NODE BIOPSY;  Surgeon: Robert Bellow, MD;  Location: [facility] ORS;  Service: General;  Laterality: Left;   UPPER ESOPHAGEAL ENDOSCOPIC ULTRASOUND (EUS) N/A 01/29/2017   Procedure: UPPER ESOPHAGEAL ENDOSCOPIC ULTRASOUND (EUS);  Surgeon: Holly Bodily, MD;  Location: [facility] ENDOSCOPY;  Service: Endoscopy;  Laterality: N/A;  Allergies: Remicade [infliximab], Sulfa antibiotics, Actonel [risedronate sodium], Fosamax [alendronate sodium], Procardia [nifedipine], Elemental sulfur, and Wellbutrin [bupropion]  Medications: Prior to Admission medications   Medication Sig Start Date End Date Taking? Authorizing Provider  albuterol (VENTOLIN HFA) 108 (90 Base) MCG/ACT inhaler Inhale 2 puffs into the lungs every 4 (four) hours as needed for wheezing or shortness of breath. 06/02/19   Bonnielee Haff, MD  ALPRAZolam Duanne Moron) 0.5 MG tablet Take 1 tablet (0.5 mg total) by mouth 4 (four) times daily as needed for anxiety. 06/02/19   Bonnielee Haff, MD  amLODipine (NORVASC) 5 MG tablet Take 1 tablet (5 mg total) by mouth daily. 09/23/21   Minna Merritts, MD  aspirin 81 MG tablet Take 81 mg by mouth daily.     [provider]  atorvastatin (LIPITOR) 40 MG  tablet Take 1 tablet (40 mg total) by mouth daily. 09/23/21   Minna Merritts, MD  clobetasol ointment (TEMOVATE) 0.05 % Apply nightly to the affected area for 4 to 6 weeks and if your symptoms are improving then decrease the treatment to every other night for four weeks, then twice weekly for 4 weeks. If symptoms resolve then one to three times per week for maintenance. 05/07/21   Gillis Ends, MD  clopidogrel (PLAVIX) 75 MG tablet Take 1 tablet (75 mg total) by mouth daily. 09/23/21   Minna Merritts, MD  dexlansoprazole (DEXILANT) 60 MG capsule Take 1 capsule (60 mg total) by mouth daily. 03/03/18   Hillary Bow, MD  DULoxetine (CYMBALTA) 60 MG capsule Take 60 mg by mouth daily. In the morning. 08/05/18   [provider]  ezetimibe (ZETIA) 10 MG tablet Take 1 tablet (10 mg total) by mouth daily. 09/23/21   Minna Merritts, MD  furosemide (LASIX) 20 MG tablet Take 1 tablet (20 mg total) by mouth every other day. May take extra 20 mg for swelling or shortness of breath 09/23/21   Minna Merritts, MD  gabapentin (NEURONTIN) 600 MG tablet Take 600 mg by mouth 3 (three) times daily.     [provider]  gabapentin (NEURONTIN) 600 MG tablet Take 1 tablet by mouth 3 (three) times daily. 04/05/21   [provider]  HYDROcodone-acetaminophen (NORCO/VICODIN) 5-325 MG tablet Take 1 tablet by mouth every 4 (four) hours as needed. 09/17/21   [provider]  hydroxychloroquine (PLAQUENIL) 200 MG tablet Take 1 tablet (200 mg total) by mouth 2 (two) times daily. 09/23/21   Minna Merritts, MD  isosorbide mononitrate (IMDUR) 30 MG 24 hr tablet Take 0.5 tablets (15 mg total) by mouth daily. 09/23/21   Minna Merritts, MD  latanoprost (XALATAN) 0.005 % ophthalmic solution Place 1 drop into both eyes at bedtime.     [provider]  metoprolol tartrate (LOPRESSOR) 25 MG tablet Take 0.5 tablets (12.5 mg total) by mouth 2 (two) times daily. 09/23/21    Minna Merritts, MD  Multiple Vitamin (MULTIVITAMIN WITH MINERALS) TABS tablet Take 1 tablet by mouth daily.    [provider]  Nintedanib (OFEV) 150 MG CAPS Take 1 capsule (150 mg total) by mouth daily. 05/22/20   Martyn Ehrich, NP  nitroGLYCERIN (NITROSTAT) 0.4 MG SL tablet DISSOLVE 1 TABLET UNDER THE TONGUE EVERY5 MINUTES AS NEEDED FOR CHEST PAIN 04/15/21   Minna Merritts, MD  polyethylene glycol (MIRALAX / GLYCOLAX) packet Take 17 g by mouth daily as needed (constipation.).     [provider]  predniSONE (DELTASONE) 5  MG tablet Take 1 tablet (5 mg total) by mouth 2 (two) times daily with a meal. RESUME AFTER COMPLETING THE PREDNISONE TAPER ORDERED SEPARATELY 06/11/19   Bonnielee Haff, MD  zolpidem (AMBIEN) 10 MG tablet Take 1 tablet (10 mg total) by mouth at bedtime as needed for sleep. 06/02/19   Bonnielee Haff, MD     Family History  Problem Relation Age of Onset   Endometrial cancer Mother 23   Hypertension Mother    Osteoporosis Mother    Bladder Cancer Father    COPD Father    Cerebral palsy Sister     Social History   Socioeconomic History   Marital status: Widowed    Spouse name: Not on file   Number of children: Not on file   Years of education: Not on file   Highest education level: Not on file  Occupational History   Occupation: retired  Tobacco Use   Smoking status: Never   Smokeless tobacco: Never  Vaping Use   Vaping Use: Never used  Substance and Sexual Activity   Alcohol use: No   Drug use: No   Sexual activity: Not on file  Other Topics Concern   Not on file  Social History Narrative   Not on file   Social Determinants of Health   Financial Resource Strain: Not on file  Food Insecurity: Not on file  Transportation Needs: Not on file  Physical Activity: Not on file  Stress: Not on file  Social Connections: Not on file    Review of Systems: A 12 point ROS discussed and pertinent positives are indicated in the HPI above.   All other systems are negative.  Review of Systems  Vital Signs: BP (!) 142/72 (BP Location: Right Arm)    Pulse 70    Temp 97.9 F (36.6 C)    Resp 20    Ht 5' (1.524 m)    Wt 156 lb (70.8 kg)    LMP  (LMP Unknown)    SpO2 99%    BMI 30.47 kg/m   Physical Exam Constitutional:      Appearance: Normal appearance.  HENT:     Head: Normocephalic and atraumatic.  Cardiovascular:     Rate and Rhythm: Normal rate and regular rhythm.  Pulmonary:     Effort: Pulmonary effort is normal. No respiratory distress.  Abdominal:     General: There is no distension.     Palpations: Abdomen is soft.     Tenderness: There is no abdominal tenderness.  Musculoskeletal:     Comments: TTP over L1 region and right posterior hip  Skin:    General: Skin is warm and dry.  Neurological:     Mental Status: She is alert and oriented to person, place, and time.    Imaging: DG Lumbar Spine Complete  Result Date: 12/09/2021 CLINICAL DATA:  Fall, bilateral hip pain, low back pain EXAM: LUMBAR SPINE - COMPLETE 4+ VIEW COMPARISON:  Prior examination of 11/18/2021 is unavailable for direct comparison. FINDINGS: 6 mm retrolisthesis of L2 upon L3. 3 mm retrolisthesis of L1 upon L2. There is a mild compression deformity of L1 with mild loss of height but no retropulsion identified. There is no paravertebral soft tissue swelling identified to suggest an acute fracture. Mild dextroscoliosis of the lumbar spine centered at L2. Intervertebral disc space narrowing and endplate remodeling at X9-0 and L2-3 is in keeping with moderate to severe degenerative disc disease with exuberant left lateral disc osteophyte formation at L2-3.  Remaining intervertebral disc heights are preserved. Remaining vertebral body heights are preserved. Paraspinal soft tissues are unremarkable. IMPRESSION: Remote appearing mild compression deformity of L1 without retropulsion. Advanced degenerative disc disease L1-2 and L2-3 with associated  retrolisthesis at these levels. No acute fracture or traumatic listhesis of the lumbar spine. Electronically Signed   By: Fidela Salisbury M.D.   On: 12/09/2021 01:50   CT HEAD WO CONTRAST (5MM)  Result Date: 12/09/2021 CLINICAL DATA:  Head trauma.  Fall 12/08/2021 and 11/30/2021 EXAM: CT HEAD WITHOUT CONTRAST TECHNIQUE: Contiguous axial images were obtained from the base of the skull through the vertex without intravenous contrast. COMPARISON:  MR head 09/29/2017.  CT head 04/08/2017 FINDINGS: Brain: Mild atrophy moderate diffuse white matter hypoattenuation has progressed bilaterally. 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 As ganglia are intact. No acute infarct, hemorrhage, or mass lesion is present. The ventricles are proportionate to the degree of atrophy. No significant extraaxial fluid collection is present. The brainstem and cerebellum are within normal limits. Vascular: Atherosclerotic calcifications are present within the cavernous internal carotid arteries bilaterally. No hyperdense vessel is present. Skull: Calvarium is intact. No focal lytic or blastic lesions are present. No significant extracranial soft tissue lesion is present. Sinuses/Orbits: The paranasal sinuses and mastoid air cells are clear. Bilateral lens replacements are noted. Globes and orbits are otherwise unremarkable. IMPRESSION: 1. Progressive atrophy and white matter disease. This likely reflects the sequela of chronic microvascular ischemia. 2. 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 3. No acute intracranial abnormality or significant interval change. No acute hemorrhage Electronically Signed   By: San Morelle M.D.   On: 12/09/2021 14:40   CT PELVIS WO CONTRAST  Result Date: 12/09/2021 CLINICAL DATA:  Hip trauma with fracture suspected EXAM: CT PELVIS WITHOUT CONTRAST TECHNIQUE: Multidetector CT imaging of the pelvis was performed following  the standard protocol without intravenous contrast. COMPARISON:  Preceding lumbar MRI FINDINGS: No visible fracture at the level of sacral edematous appearance on prior study. There is greater bony density at this level without out right sclerotic lesion. The adjacent attaching piriformis is thicker than the left, this may be reactive changes from altered weight-bearing or healing remote trauma. No hip fracture or dislocation.  Left hip arthroplasty. Osteopenia and advanced lower lumbar spine degeneration. No acute soft tissue finding. Tensor fossa lata atrophy and fatty infiltration on the right. Atherosclerosis. Colonic diverticulosis. IMPRESSION: No acute finding. Electronically Signed   By: Jorje Guild M.D.   On: 12/09/2021 07:48   MR LUMBAR SPINE WO CONTRAST  Result Date: 12/09/2021 CLINICAL DATA:  Fall Christmas Eve with back pain EXAM: MRI LUMBAR SPINE WITHOUT CONTRAST TECHNIQUE: Multiplanar, multisequence MR imaging of the lumbar spine was performed. No intravenous contrast was administered. COMPARISON:  07/31/2017 lumbar FINDINGS: Segmentation:  5 lumbar type vertebrae Alignment: Grade 1 anterolisthesis at L4-5 and L5-S1. Mild retrolisthesis at L2-3. Vertebrae: L1 body compression fracture with inferior endplate depression. There is marrow edema around a horizontal fluid containing fracture cleft. Height loss measures up to 30% with mild retropulsion of the posteroinferior corner. Mild marrow edematous signal at the inferior right S1 level seen on sagittal images. Conus medullaris and cauda equina: Conus extends to the L2 level. Conus and cauda equina appear normal. Paraspinal and other soft tissues: No perispinal mass or inflammation. Disc levels: T12- L1: Unremarkable. L1-L2: Unremarkable. L2-L3: Disc collapse with endplate ridging and disc bulging eccentric to the left where there  is also asymmetric facet spurring. Left foraminal impingement L3-L4: Mild disc bulging and facet spurring L4-L5: Facet  spurring with anterolisthesis. Disc space narrowing and bulging with annular fissuring. L5-S1:Disc narrowing and bulging with annular fissure and facet spurring. Moderate right foraminal impingement. Right subarticular recess narrowing without static compression. IMPRESSION: 1. Acute L1 compression fracture with 30% height loss and mild, noncompressive retropulsion. 2. Partially covered edema in the lower right sacrum, question insufficiency fracture at this level. 3. Lumbar spine degeneration with foraminal impingement on the left at L2-3 and to a lesser extent on the right at L5-S1. Electronically Signed   By: Jorje Guild M.D.   On: 12/09/2021 04:11   DG Hip Unilat W or Wo Pelvis 2-3 Views Left  Result Date: 12/09/2021 CLINICAL DATA:  Fall, left hip pain EXAM: DG HIP (WITH OR WITHOUT PELVIS) 2-3V LEFT COMPARISON:  None. FINDINGS: Left hip bipolar hemiarthroplasty is in place. No acute fracture or dislocation. Vascular calcifications are noted within the medial left thigh. IMPRESSION: Negative. Electronically Signed   By: Fidela Salisbury M.D.   On: 12/09/2021 01:52   DG Hip Unilat W or Wo Pelvis 2-3 Views Right  Result Date: 12/09/2021 CLINICAL DATA:  Fall, bilateral hip pain EXAM: DG HIP (WITH OR WITHOUT PELVIS) 2-3V RIGHT COMPARISON:  None. FINDINGS: Normal alignment. No fracture or dislocation. Right hip joint space is preserved. Left hip bipolar hemiarthroplasty has been performed. IMPRESSION: Negative. Electronically Signed   By: Fidela Salisbury M.D.   On: 12/09/2021 01:51    Labs:  CBC: Recent Labs    12/09/21 0512 12/10/21 0550 12/10/21 1015  WBC 14.6* 10.6* 10.1  HGB 9.6* 10.3* 10.3*  HCT 29.9* 32.6* 31.9*  PLT 353 371 379    COAGS: Recent Labs    12/09/21 1033  INR 1.2  APTT 32    BMP: Recent Labs    01/18/21 1116 01/25/21 1457 12/09/21 0512 12/10/21 1015  NA 129* 130* 132* 129*  K 4.6 4.5 4.1 3.9  CL 95* 100 99 99  CO2 19* 23 24 19*  GLUCOSE 82 98 91 79  BUN 11  15 12 14   CALCIUM 8.7 8.1* 8.4* 8.0*  CREATININE 0.67 0.87 0.76 0.79  GFRNONAA 86 >60 >60 >60  GFRAA 99  --   --   --     LIVER FUNCTION TESTS: Recent Labs    01/18/21 1116 12/09/21 0512  BILITOT 0.3 0.7  AST 24 23  ALT 19 18  ALKPHOS 88 91  PROT 5.9* 5.9*  ALBUMIN 3.5* 2.7*    Assessment and Plan: This is a 76 year old female with PMHx significant for rheumatoid arthritis, scleroderma, osteopenia with prior vertebral augmentation at thoracic 7, 8, 9 in 2020 by Dr. Rudene Christians who is admitted currently secondary to low back pain after recent falls. The patient currently lives at home and performs her ADL's fairly independently and with hired assistance who comes in every couple weeks. She uses a walker to ambulate and has had 6 recent falls in the past couple weeks. She presented to the ED 1/2 with complaints of uncontrolled back pain and hip pain.   Imaging performed and findings of acute L1 compression fracture with 30% height loss and mild, non compressive retropulsion. Hip imaging without fracture. Dr. Rudene Christians has seen the patient and requested IR evaluation for possible kyphoplasty. Patient is clinically tender at region of L1 which is new pain for her since the fall on 12/24, no new changes in neurological function, maximum  pain medication is helping but still rating lower back pain 6/10 and uncontrolled, back brace helps some but unable to tolerate for long periods of time. The patient wishes to proceed with kyphoplasty.   Imaging, labs and vitals have been reviewed.  Risks and benefits of image guided lumbar level 1 Kyphoplasty were discussed with the patient and family including, but not limited to education regarding the natural healing process of compression fractures without intervention, bleeding, infection, cement migration or damage to adjacent structures which may cause spinal cord damage. All of the patient's questions were answered, patient is agreeable to proceed. Consent signed  and in chart.   The patient is on plavix with a history of [name] and prior stents in 2021, last dose Sunday 1/1, we will need to wait 5 days until off of plavix and plan to perform Kyphoplasty Monday 1/9.  Thank you for this interesting consult.  I greatly enjoyed meeting Erin [name] and look forward to participating in their care.  A copy of this report was sent to the requesting provider on this date.  Electronically Signed: Hedy Jacob, PA-C 12/10/2021, 3:23 PM   I spent a total of 40 Minutes in face to face in clinical consultation, greater than 50% of which was counseling/coordinating care for symptomatic lumbar level 1 compression fracture.

## 2021-12-10 NOTE — Progress Notes (Signed)
PT Cancellation Note  Patient Details Name: Erin [name] MRN: [id number] DOB: 1946/11/22   Cancelled Treatment:    Reason Eval/Treat Not Completed: Medical issues which prohibited therapy Patient is pending possible kyphoplasty. Dr Dwyane Dee recommended to wait to mobilize until after procedure. Please re-consult PT when procedure is completed or when patient is allowed to mobilize.   Minna Merritts, PT, MPT   Percell Locus 12/10/2021, 10:15 AM

## 2021-12-10 NOTE — Progress Notes (Signed)
[facility] Progress Note  Patient: Erin [name]    YTK:[id number]  DOA: 12/09/2021     Date of Service: the patient was seen and examined on 12/10/2021  Chief Complaint  Patient presents with   Fall   Brief hospital course: Erin [name] is a 76 y.o. 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 ED Course: pt was found to have WBC 14.6, negative COVID PCR, GFR > 60, temperature normal, blood pressure 144/74, heart rate 86, RR 18, oxygen saturation 97% on room air.  CT scan of her pelvis is negative for acute injury.  X-ray of left hip/pelvis is negative.  MRI of the lumbar spine showed L1 compression fracture with 30% of height loss.  Patient is admitted to [facility] bed as inpatient.  Dr. Rudene Christians of Ortho is consulted.   MRI-L spin: 1. Acute L1 compression fracture with 30% height loss and mild, noncompressive retropulsion. 2. Partially covered edema in the lower right sacrum, question insufficiency fracture at this level. 3. Lumbar spine degeneration with foraminal impingement on the left at L2-3 and to a lesser extent on the right at L5-S1     CT-head: 1. Progressive atrophy and white matter disease. This likely reflects the sequela of chronic microvascular ischemia. 2. 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 3. No acute intracranial abnormality or significant interval change. 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 Rudene Christians of orthopedic surgery for possible kyphoplasty. -LSO -Pain control: As needed morphine, Percocet, Tylenol, Lidoderm -As needed Robaxin Patient took Plavix, last dose was on 1/1, discontinued Plavix for washout before the procedure. IR consulted, need to wait for Plavix washout and kyphoplasty will be done at the end of this week  Fall: CT of head is negative for acute intracranial abnormalities. 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  Patient has only trace leg edema, no respiratory distress.  Does not seem to have CHF exacerbation. 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  Interventions:      Diet: Heart healthy  DVT Prophylaxis: Subcutaneous Lovenox   Advance goals of care discussion: DNR  Family Communication: family was present at bedside, at the time of interview.  The pt provided permission to discuss medical plan with the  family. Opportunity was given to ask question and all questions were answered satisfactorily.   Disposition:  Pt is from Home, admitted with fall and fracture of L1 vertebra, pending kyphoplasty by IR, which precludes a safe discharge. Discharge to home versus SNF, when kyphoplasty would be done.  Subjective: No significant overnight events, patient still has significant pain in the back 8/10 which is controlled with pain medications.  Patient denies any chest palpitation, no shortness of breath.  Physical Exam: General:  alert oriented to time, place, and person.  Appear in mild distress, affect appropriate Eyes: PERRLA ENT: Oral Mucosa Clear, moist  Neck: no JVD,  Cardiovascular: S1 and S2 Present, no Murmur,  Respiratory: good respiratory effort, Bilateral Air entry equal and Decreased, no Crackles, no wheezes Abdomen: Bowel Sound present, Soft and no tenderness,  Skin: no rashes Extremities: no Pedal edema, no calf tenderness Neurologic: without any new focal findings Gait not checked due to patient safety concerns  Vitals:   12/09/21 1955 12/10/21 0737 12/10/21 1205 12/10/21 1528  BP: 140/69 128/65 (!) 142/72 (!) 141/70  Pulse: 74 93 70 77  Resp: 18 16 20 18   Temp: 97.9 F (36.6 C) 98.3 F (36.8 C) 97.9 F (36.6 C) 98.2 F (36.8 C)  TempSrc:      SpO2: 98% 100% 99% 92%  Weight:      Height:        Intake/Output Summary (Last 24 hours) at 12/10/2021 1603 Last data filed at 12/10/2021 0954 Gross per 24 hour  Intake --  Output 700 ml  Net -700 ml   Filed Weights   12/09/21 0952  Weight: 70.8 kg    Data Reviewed: I have personally reviewed and interpreted daily labs, tele strips, imagings as discussed above. I reviewed all nursing notes, pharmacy notes, vitals, pertinent old records I have discussed plan of care as described above with RN and patient/family.  CBC: Recent Labs  Lab 12/09/21 0512 12/10/21 0550 12/10/21 1015  WBC 14.6* 10.6* 10.1  NEUTROABS  11.4*  --   --   HGB 9.6* 10.3* 10.3*  HCT 29.9* 32.6* 31.9*  MCV 85.9 86.5 85.5  PLT 353 371 710   Basic Metabolic Panel: Recent Labs  Lab 12/09/21 0512 12/10/21 1015  NA 132* 129*  K 4.1 3.9  CL 99 99  CO2 24 19*  GLUCOSE 91 79  BUN 12 14  CREATININE 0.76 0.79  CALCIUM 8.4* 8.0*  MG  --  2.0  PHOS  --  5.4*    Studies: No results found.  Scheduled Meds:  amLODipine  5 mg Oral Daily   aspirin  81 mg Oral Daily   atorvastatin  40 mg Oral Daily   DULoxetine  60 mg Oral Daily   enoxaparin (LOVENOX) injection  0.5 mg/kg Subcutaneous Q24H   ezetimibe  10 mg Oral Daily   furosemide  20 mg Oral QODAY   gabapentin  600 mg Oral TID   hydroxychloroquine  200 mg Oral Daily   And   hydroxychloroquine  200 mg Oral QODAY   isosorbide mononitrate  15 mg Oral Daily   latanoprost  1 drop Both Eyes QHS   lidocaine  1 patch Transdermal Q24H   metoprolol tartrate  12.5 mg Oral BID   multivitamin with  minerals  1 tablet Oral Daily   Nintedanib  150 mg Oral Daily   pantoprazole  40 mg Oral Daily   predniSONE  5 mg Oral Q breakfast   Continuous Infusions: PRN Meds: acetaminophen, albuterol, ALPRAZolam, dextromethorphan-guaiFENesin, hydrALAZINE, methocarbamol, morphine injection, nitroGLYCERIN, ondansetron (ZOFRAN) IV, oxyCODONE-acetaminophen, polyethylene glycol, zolpidem  Time spent: 35 minutes  Author: Val Riles. MD [facility] 12/10/2021 4:03 PM  To reach On-call, see care teams to locate the attending and reach out to them via [email or web address]. If 7PM-7AM, please contact night-coverage If you still have difficulty reaching the attending provider, please page the [facility] (Director on Call) for [facility] on amion for assistance.

## 2021-12-10 NOTE — Progress Notes (Signed)
Verbal order received by MD Dwyane Dee to d/c NPO order and add heart healthy diet. Order placed.

## 2021-12-10 NOTE — Progress Notes (Signed)
OT Cancellation Note  Patient Details Name: Erin [name] MRN: [id number] DOB: Feb 13, 1946   Cancelled Treatment:    Reason Eval/Treat Not Completed: Medical issues which prohibited therapy. OT order received and chart reviewed. Patient is pending possible kyphoplasty. Dr Dwyane Dee recommended to wait until after procedure. Please re-consult OT when procedure is completed or when patient is allowed to actively participate in OT intervention.   Darleen Crocker, MS, OTR/L , CBIS ascom [phone]  12/10/21, 11:07 AM

## 2021-12-10 NOTE — H&P (View-Only) (Signed)
Chief Complaint: Patient was seen in consultation today for uncontrolled back pain, symptomatic L1 compression fracture at the request of Dr. Rudene Christians  Referring Physician(s): Dr. Rudene Christians  Supervising Physician: Juliet Rude  Patient Status: [facility] - In-pt  History of Present Illness: Erin [name] is a 76 y.o. female with PMHx significant for rheumatoid arthritis, scleroderma, osteopenia with prior vertebral augmentation at thoracic 7, 8, 9 in 2020 by Dr. Rudene Christians who is admitted currently secondary to low back pain after recent falls. The patient currently lives at home and performs her ADL's fairly independently and with hired assistance who comes in every couple weeks. She uses a walker to ambulate and has had 6 recent falls in the past couple weeks. She presented to the ED 1/2 with complaints of uncontrolled back pain and hip pain.   Imaging was performed and revealed findings of acute L1 compression fracture with 30% height loss and mild, non compressive retropulsion. Hip imaging without fracture. Dr. Rudene Christians has seen the patient and requested IR evaluation for possible kyphoplasty. Patient states she has mid to lower back pain which is new pain for her since the fall on 12/24, no new changes in neurological function, she denies any new loss of bowel or bladder function. She does complain of chronic urinary incontinence at times however there is no new change in this. She rated her back pain 9/10 when she presented to the ED 1/2 and has been treated with Percocet, morphine, Robaxin and lidocaine patch. She states at home she takes hydrocodone only for her chronic upper back pain, she does admit the pain medication is helping but still rating her lower back pain 6/10 and uncontrolled, she states the back brace helps some but unable to tolerate for long periods of time.   The patient denies any current chest pain or any changes in her chronic shortness of breath. She states her last dose of plavix was  1/1 on Sunday morning. The patient denies any recent infections, fever or chills. The patient denies any history of sleep apnea or chronic oxygen use. She has no known complications to moderate sedation.    Past Medical History:  Diagnosis Date   Anemia    h/o   Cancer ([place])    melanoma x 3the last one was of her leg and surgeon removed it   Colon polyp    Complication of anesthesia    PT STATED THAT WITH JOINT SURGERY THAT SHE WAS GIVEN SOME TYPE OF ANESTHESIA THAT MADE HER HALLUCINATE    Coronary artery calcification    Coronary artery disease    DDD (degenerative disc disease), lumbar    Diverticulosis 01/20/2017   Dysphagia    Dyspnea    with exertion-unable to walk a mile without getting sob- dr sparks set pt up to see Dr Rockey Situ after 11-19-16 surgery   Environmental allergies    Esophageal dysmotility    Fibrocystic breast disease    GERD (gastroesophageal reflux disease)    Heart murmur    asymptomatic   Hyperlipidemia    unspecified   Hypertension    Mitral regurgitation    Monilial esophagitis ([place]) 01/20/2017   Obstipation    Osteopenia    Peptic ulcer disease    Pulmonary fibrosis ([place])    per Dr Raul Del   RA (rheumatoid arthritis) ([place])    Raynaud's disease    Redundant colon 01/20/2017   Scleroderma ([place])    Telangiectasia of colon    Tubular adenoma of colon  01/20/2017   unspecified   Vulvar dysplasia     Past Surgical History:  Procedure Laterality Date   ABDOMINAL HYSTERECTOMY     BREAST CYST EXCISION Left 20+ years ago   No scar visible   BREAST LUMPECTOMY Right    CARDIAC CATHETERIZATION     CARPAL TUNNEL RELEASE Bilateral    COLON SURGERY     colon polyp    COLONOSCOPY  09/30/2011   tubular adenoma rtm   COLONOSCOPY     05/02/2003, 04/10/2000   COLONOSCOPY WITH PROPOFOL N/A 01/20/2017   Procedure: COLONOSCOPY WITH PROPOFOL;  Surgeon: Lollie Sails, MD;  Location: [facility] ENDOSCOPY;  Service: Endoscopy;  Laterality: N/A;   COLONOSCOPY WITH  PROPOFOL N/A 09/17/2020   Procedure: COLONOSCOPY WITH PROPOFOL;  Surgeon: Lesly Rubenstein, MD;  Location: [facility] ENDOSCOPY;  Service: Endoscopy;  Laterality: N/A;   CORONARY ATHERECTOMY N/A 11/21/2020   Procedure: CORONARY ATHERECTOMY;  Surgeon: Wellington Hampshire, MD;  Location: [place] CV LAB;  Service: Cardiovascular;  Laterality: N/A;   CORONARY STENT INTERVENTION  11/21/2020   CORONARY STENT INTERVENTION N/A 11/21/2020   Procedure: CORONARY STENT INTERVENTION;  Surgeon: Wellington Hampshire, MD;  Location: [place] CV LAB;  Service: Cardiovascular;  Laterality: N/A;   ESOPHAGOGASTRODUODENOSCOPY     ESOPHAGOGASTRODUODENOSCOPY     09/30/2011, 04/10/2000 , no repeat rtm   ESOPHAGOGASTRODUODENOSCOPY N/A 03/02/2018   Procedure: ESOPHAGOGASTRODUODENOSCOPY (EGD);  Surgeon: Virgel Manifold, MD;  Location: [facility] ENDOSCOPY;  Service: Endoscopy;  Laterality: N/A;   ESOPHAGOGASTRODUODENOSCOPY (EGD) WITH PROPOFOL N/A 01/20/2017   Procedure: ESOPHAGOGASTRODUODENOSCOPY (EGD) WITH PROPOFOL;  Surgeon: Lollie Sails, MD;  Location: [facility] ENDOSCOPY;  Service: Endoscopy;  Laterality: N/A;   ESOPHAGOGASTRODUODENOSCOPY (EGD) WITH PROPOFOL N/A 09/17/2020   Procedure: ESOPHAGOGASTRODUODENOSCOPY (EGD) WITH PROPOFOL;  Surgeon: Lesly Rubenstein, MD;  Location: [facility] ENDOSCOPY;  Service: Endoscopy;  Laterality: N/A;   EXCISION HYDRADENITIS LABIA Left 11/19/2016   Procedure: EXCISION LABIAL MASS;  Surgeon: Robert Bellow, MD;  Location: [facility] ORS;  Service: General;  Laterality: Left;   EYE SURGERY Bilateral    cataracts   HEEL SPUR EXCISION     HIP ARTHROPLASTY Left 02/23/2018   Procedure: ARTHROPLASTY BIPOLAR HIP (HEMIARTHROPLASTY);  Surgeon: Thornton Park, MD;  Location: [facility] ORS;  Service: Orthopedics;  Laterality: Left;   KYPHOPLASTY N/A 04/26/2019   Procedure: KYPHOPLASTY T7;  Surgeon: Hessie Knows, MD;  Location: [facility] ORS;  Service: Orthopedics;  Laterality: N/A;   KYPHOPLASTY N/A 05/17/2019    Procedure: KYPHOPLASTY T8;  Surgeon: Hessie Knows, MD;  Location: [facility] ORS;  Service: Orthopedics;  Laterality: N/A;   KYPHOPLASTY N/A 07/26/2019   Procedure: T-9 KYPHOPLASTY;  Surgeon: Hessie Knows, MD;  Location: [facility] ORS;  Service: Orthopedics;  Laterality: N/A;   MASS EXCISION Left 11/19/2016   Procedure: EXCISION LEFT THIGH MELANOMA;  Surgeon: Robert Bellow, MD;  Location: [facility] ORS;  Service: General;  Laterality: Left;   RIGHT/LEFT HEART CATH AND CORONARY ANGIOGRAPHY N/A 11/15/2020   Procedure: RIGHT/LEFT HEART CATH AND CORONARY ANGIOGRAPHY poss intervention;  Surgeon: Minna Merritts, MD;  Location: [place] CV LAB;  Service: Cardiovascular;  Laterality: N/A;   SENTINEL NODE BIOPSY Left 11/19/2016   Procedure: INGUINAL SENTINEL NODE BIOPSY;  Surgeon: Robert Bellow, MD;  Location: [facility] ORS;  Service: General;  Laterality: Left;   UPPER ESOPHAGEAL ENDOSCOPIC ULTRASOUND (EUS) N/A 01/29/2017   Procedure: UPPER ESOPHAGEAL ENDOSCOPIC ULTRASOUND (EUS);  Surgeon: Holly Bodily, MD;  Location: [facility] ENDOSCOPY;  Service: Endoscopy;  Laterality: N/A;  Allergies: Remicade [infliximab], Sulfa antibiotics, Actonel [risedronate sodium], Fosamax [alendronate sodium], Procardia [nifedipine], Elemental sulfur, and Wellbutrin [bupropion]  Medications: Prior to Admission medications   Medication Sig Start Date End Date Taking? Authorizing Provider  albuterol (VENTOLIN HFA) 108 (90 Base) MCG/ACT inhaler Inhale 2 puffs into the lungs every 4 (four) hours as needed for wheezing or shortness of breath. 06/02/19   Bonnielee Haff, MD  ALPRAZolam Duanne Moron) 0.5 MG tablet Take 1 tablet (0.5 mg total) by mouth 4 (four) times daily as needed for anxiety. 06/02/19   Bonnielee Haff, MD  amLODipine (NORVASC) 5 MG tablet Take 1 tablet (5 mg total) by mouth daily. 09/23/21   Minna Merritts, MD  aspirin 81 MG tablet Take 81 mg by mouth daily.     [provider]  atorvastatin (LIPITOR) 40 MG  tablet Take 1 tablet (40 mg total) by mouth daily. 09/23/21   Minna Merritts, MD  clobetasol ointment (TEMOVATE) 0.05 % Apply nightly to the affected area for 4 to 6 weeks and if your symptoms are improving then decrease the treatment to every other night for four weeks, then twice weekly for 4 weeks. If symptoms resolve then one to three times per week for maintenance. 05/07/21   Gillis Ends, MD  clopidogrel (PLAVIX) 75 MG tablet Take 1 tablet (75 mg total) by mouth daily. 09/23/21   Minna Merritts, MD  dexlansoprazole (DEXILANT) 60 MG capsule Take 1 capsule (60 mg total) by mouth daily. 03/03/18   Hillary Bow, MD  DULoxetine (CYMBALTA) 60 MG capsule Take 60 mg by mouth daily. In the morning. 08/05/18   [provider]  ezetimibe (ZETIA) 10 MG tablet Take 1 tablet (10 mg total) by mouth daily. 09/23/21   Minna Merritts, MD  furosemide (LASIX) 20 MG tablet Take 1 tablet (20 mg total) by mouth every other day. May take extra 20 mg for swelling or shortness of breath 09/23/21   Minna Merritts, MD  gabapentin (NEURONTIN) 600 MG tablet Take 600 mg by mouth 3 (three) times daily.     [provider]  gabapentin (NEURONTIN) 600 MG tablet Take 1 tablet by mouth 3 (three) times daily. 04/05/21   [provider]  HYDROcodone-acetaminophen (NORCO/VICODIN) 5-325 MG tablet Take 1 tablet by mouth every 4 (four) hours as needed. 09/17/21   [provider]  hydroxychloroquine (PLAQUENIL) 200 MG tablet Take 1 tablet (200 mg total) by mouth 2 (two) times daily. 09/23/21   Minna Merritts, MD  isosorbide mononitrate (IMDUR) 30 MG 24 hr tablet Take 0.5 tablets (15 mg total) by mouth daily. 09/23/21   Minna Merritts, MD  latanoprost (XALATAN) 0.005 % ophthalmic solution Place 1 drop into both eyes at bedtime.     [provider]  metoprolol tartrate (LOPRESSOR) 25 MG tablet Take 0.5 tablets (12.5 mg total) by mouth 2 (two) times daily. 09/23/21    Minna Merritts, MD  Multiple Vitamin (MULTIVITAMIN WITH MINERALS) TABS tablet Take 1 tablet by mouth daily.    [provider]  Nintedanib (OFEV) 150 MG CAPS Take 1 capsule (150 mg total) by mouth daily. 05/22/20   Martyn Ehrich, NP  nitroGLYCERIN (NITROSTAT) 0.4 MG SL tablet DISSOLVE 1 TABLET UNDER THE TONGUE EVERY5 MINUTES AS NEEDED FOR CHEST PAIN 04/15/21   Minna Merritts, MD  polyethylene glycol (MIRALAX / GLYCOLAX) packet Take 17 g by mouth daily as needed (constipation.).     [provider]  predniSONE (DELTASONE) 5  MG tablet Take 1 tablet (5 mg total) by mouth 2 (two) times daily with a meal. RESUME AFTER COMPLETING THE PREDNISONE TAPER ORDERED SEPARATELY 06/11/19   Bonnielee Haff, MD  zolpidem (AMBIEN) 10 MG tablet Take 1 tablet (10 mg total) by mouth at bedtime as needed for sleep. 06/02/19   Bonnielee Haff, MD     Family History  Problem Relation Age of Onset   Endometrial cancer Mother 70   Hypertension Mother    Osteoporosis Mother    Bladder Cancer Father    COPD Father    Cerebral palsy Sister     Social History   Socioeconomic History   Marital status: Widowed    Spouse name: Not on file   Number of children: Not on file   Years of education: Not on file   Highest education level: Not on file  Occupational History   Occupation: retired  Tobacco Use   Smoking status: Never   Smokeless tobacco: Never  Vaping Use   Vaping Use: Never used  Substance and Sexual Activity   Alcohol use: No   Drug use: No   Sexual activity: Not on file  Other Topics Concern   Not on file  Social History Narrative   Not on file   Social Determinants of Health   Financial Resource Strain: Not on file  Food Insecurity: Not on file  Transportation Needs: Not on file  Physical Activity: Not on file  Stress: Not on file  Social Connections: Not on file    Review of Systems: A 12 point ROS discussed and pertinent positives are indicated in the HPI above.   All other systems are negative.  Review of Systems  Vital Signs: BP (!) 142/72 (BP Location: Right Arm)    Pulse 70    Temp 97.9 F (36.6 C)    Resp 20    Ht 5' (1.524 m)    Wt 156 lb (70.8 kg)    LMP  (LMP Unknown)    SpO2 99%    BMI 30.47 kg/m   Physical Exam Constitutional:      Appearance: Normal appearance.  HENT:     Head: Normocephalic and atraumatic.  Cardiovascular:     Rate and Rhythm: Normal rate and regular rhythm.  Pulmonary:     Effort: Pulmonary effort is normal. No respiratory distress.  Abdominal:     General: There is no distension.     Palpations: Abdomen is soft.     Tenderness: There is no abdominal tenderness.  Musculoskeletal:     Comments: TTP over L1 region and right posterior hip  Skin:    General: Skin is warm and dry.  Neurological:     Mental Status: She is alert and oriented to person, place, and time.    Imaging: DG Lumbar Spine Complete  Result Date: 12/09/2021 CLINICAL DATA:  Fall, bilateral hip pain, low back pain EXAM: LUMBAR SPINE - COMPLETE 4+ VIEW COMPARISON:  Prior examination of 11/18/2021 is unavailable for direct comparison. FINDINGS: 6 mm retrolisthesis of L2 upon L3. 3 mm retrolisthesis of L1 upon L2. There is a mild compression deformity of L1 with mild loss of height but no retropulsion identified. There is no paravertebral soft tissue swelling identified to suggest an acute fracture. Mild dextroscoliosis of the lumbar spine centered at L2. Intervertebral disc space narrowing and endplate remodeling at O7-5 and L2-3 is in keeping with moderate to severe degenerative disc disease with exuberant left lateral disc osteophyte formation at L2-3.  Remaining intervertebral disc heights are preserved. Remaining vertebral body heights are preserved. Paraspinal soft tissues are unremarkable. IMPRESSION: Remote appearing mild compression deformity of L1 without retropulsion. Advanced degenerative disc disease L1-2 and L2-3 with associated  retrolisthesis at these levels. No acute fracture or traumatic listhesis of the lumbar spine. Electronically Signed   By: Fidela Salisbury M.D.   On: 12/09/2021 01:50   CT HEAD WO CONTRAST (5MM)  Result Date: 12/09/2021 CLINICAL DATA:  Head trauma.  Fall 12/08/2021 and 11/30/2021 EXAM: CT HEAD WITHOUT CONTRAST TECHNIQUE: Contiguous axial images were obtained from the base of the skull through the vertex without intravenous contrast. COMPARISON:  MR head 09/29/2017.  CT head 04/08/2017 FINDINGS: Brain: Mild atrophy moderate diffuse white matter hypoattenuation has progressed bilaterally. 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 As ganglia are intact. No acute infarct, hemorrhage, or mass lesion is present. The ventricles are proportionate to the degree of atrophy. No significant extraaxial fluid collection is present. The brainstem and cerebellum are within normal limits. Vascular: Atherosclerotic calcifications are present within the cavernous internal carotid arteries bilaterally. No hyperdense vessel is present. Skull: Calvarium is intact. No focal lytic or blastic lesions are present. No significant extracranial soft tissue lesion is present. Sinuses/Orbits: The paranasal sinuses and mastoid air cells are clear. Bilateral lens replacements are noted. Globes and orbits are otherwise unremarkable. IMPRESSION: 1. Progressive atrophy and white matter disease. This likely reflects the sequela of chronic microvascular ischemia. 2. 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 3. No acute intracranial abnormality or significant interval change. No acute hemorrhage Electronically Signed   By: San Morelle M.D.   On: 12/09/2021 14:40   CT PELVIS WO CONTRAST  Result Date: 12/09/2021 CLINICAL DATA:  Hip trauma with fracture suspected EXAM: CT PELVIS WITHOUT CONTRAST TECHNIQUE: Multidetector CT imaging of the pelvis was performed following  the standard protocol without intravenous contrast. COMPARISON:  Preceding lumbar MRI FINDINGS: No visible fracture at the level of sacral edematous appearance on prior study. There is greater bony density at this level without out right sclerotic lesion. The adjacent attaching piriformis is thicker than the left, this may be reactive changes from altered weight-bearing or healing remote trauma. No hip fracture or dislocation.  Left hip arthroplasty. Osteopenia and advanced lower lumbar spine degeneration. No acute soft tissue finding. Tensor fossa lata atrophy and fatty infiltration on the right. Atherosclerosis. Colonic diverticulosis. IMPRESSION: No acute finding. Electronically Signed   By: Jorje Guild M.D.   On: 12/09/2021 07:48   MR LUMBAR SPINE WO CONTRAST  Result Date: 12/09/2021 CLINICAL DATA:  Fall Christmas Eve with back pain EXAM: MRI LUMBAR SPINE WITHOUT CONTRAST TECHNIQUE: Multiplanar, multisequence MR imaging of the lumbar spine was performed. No intravenous contrast was administered. COMPARISON:  07/31/2017 lumbar FINDINGS: Segmentation:  5 lumbar type vertebrae Alignment: Grade 1 anterolisthesis at L4-5 and L5-S1. Mild retrolisthesis at L2-3. Vertebrae: L1 body compression fracture with inferior endplate depression. There is marrow edema around a horizontal fluid containing fracture cleft. Height loss measures up to 30% with mild retropulsion of the posteroinferior corner. Mild marrow edematous signal at the inferior right S1 level seen on sagittal images. Conus medullaris and cauda equina: Conus extends to the L2 level. Conus and cauda equina appear normal. Paraspinal and other soft tissues: No perispinal mass or inflammation. Disc levels: T12- L1: Unremarkable. L1-L2: Unremarkable. L2-L3: Disc collapse with endplate ridging and disc bulging eccentric to the left where there  is also asymmetric facet spurring. Left foraminal impingement L3-L4: Mild disc bulging and facet spurring L4-L5: Facet  spurring with anterolisthesis. Disc space narrowing and bulging with annular fissuring. L5-S1:Disc narrowing and bulging with annular fissure and facet spurring. Moderate right foraminal impingement. Right subarticular recess narrowing without static compression. IMPRESSION: 1. Acute L1 compression fracture with 30% height loss and mild, noncompressive retropulsion. 2. Partially covered edema in the lower right sacrum, question insufficiency fracture at this level. 3. Lumbar spine degeneration with foraminal impingement on the left at L2-3 and to a lesser extent on the right at L5-S1. Electronically Signed   By: Jorje Guild M.D.   On: 12/09/2021 04:11   DG Hip Unilat W or Wo Pelvis 2-3 Views Left  Result Date: 12/09/2021 CLINICAL DATA:  Fall, left hip pain EXAM: DG HIP (WITH OR WITHOUT PELVIS) 2-3V LEFT COMPARISON:  None. FINDINGS: Left hip bipolar hemiarthroplasty is in place. No acute fracture or dislocation. Vascular calcifications are noted within the medial left thigh. IMPRESSION: Negative. Electronically Signed   By: Fidela Salisbury M.D.   On: 12/09/2021 01:52   DG Hip Unilat W or Wo Pelvis 2-3 Views Right  Result Date: 12/09/2021 CLINICAL DATA:  Fall, bilateral hip pain EXAM: DG HIP (WITH OR WITHOUT PELVIS) 2-3V RIGHT COMPARISON:  None. FINDINGS: Normal alignment. No fracture or dislocation. Right hip joint space is preserved. Left hip bipolar hemiarthroplasty has been performed. IMPRESSION: Negative. Electronically Signed   By: Fidela Salisbury M.D.   On: 12/09/2021 01:51    Labs:  CBC: Recent Labs    12/09/21 0512 12/10/21 0550 12/10/21 1015  WBC 14.6* 10.6* 10.1  HGB 9.6* 10.3* 10.3*  HCT 29.9* 32.6* 31.9*  PLT 353 371 379    COAGS: Recent Labs    12/09/21 1033  INR 1.2  APTT 32    BMP: Recent Labs    01/18/21 1116 01/25/21 1457 12/09/21 0512 12/10/21 1015  NA 129* 130* 132* 129*  K 4.6 4.5 4.1 3.9  CL 95* 100 99 99  CO2 19* 23 24 19*  GLUCOSE 82 98 91 79  BUN 11  15 12 14   CALCIUM 8.7 8.1* 8.4* 8.0*  CREATININE 0.67 0.87 0.76 0.79  GFRNONAA 86 >60 >60 >60  GFRAA 99  --   --   --     LIVER FUNCTION TESTS: Recent Labs    01/18/21 1116 12/09/21 0512  BILITOT 0.3 0.7  AST 24 23  ALT 19 18  ALKPHOS 88 91  PROT 5.9* 5.9*  ALBUMIN 3.5* 2.7*    Assessment and Plan: This is a 76 year old female with PMHx significant for rheumatoid arthritis, scleroderma, osteopenia with prior vertebral augmentation at thoracic 7, 8, 9 in 2020 by Dr. Rudene Christians who is admitted currently secondary to low back pain after recent falls. The patient currently lives at home and performs her ADL's fairly independently and with hired assistance who comes in every couple weeks. She uses a walker to ambulate and has had 6 recent falls in the past couple weeks. She presented to the ED 1/2 with complaints of uncontrolled back pain and hip pain.   Imaging performed and findings of acute L1 compression fracture with 30% height loss and mild, non compressive retropulsion. Hip imaging without fracture. Dr. Rudene Christians has seen the patient and requested IR evaluation for possible kyphoplasty. Patient is clinically tender at region of L1 which is new pain for her since the fall on 12/24, no new changes in neurological function, maximum  pain medication is helping but still rating lower back pain 6/10 and uncontrolled, back brace helps some but unable to tolerate for long periods of time. The patient wishes to proceed with kyphoplasty.   Imaging, labs and vitals have been reviewed.  Risks and benefits of image guided lumbar level 1 Kyphoplasty were discussed with the patient and family including, but not limited to education regarding the natural healing process of compression fractures without intervention, bleeding, infection, cement migration or damage to adjacent structures which may cause spinal cord damage. All of the patient's questions were answered, patient is agreeable to proceed. Consent signed  and in chart.   The patient is on plavix with a history of [name] and prior stents in 2021, last dose Sunday 1/1, we will need to wait 5 days until off of plavix and plan to perform Kyphoplasty Monday 1/9.  Thank you for this interesting consult.  I greatly enjoyed meeting Erin [name] and look forward to participating in their care.  A copy of this report was sent to the requesting provider on this date.  Electronically Signed: Hedy Jacob, PA-C 12/10/2021, 3:23 PM   I spent a total of 40 Minutes in face to face in clinical consultation, greater than 50% of which was counseling/coordinating care for symptomatic lumbar level 1 compression fracture.

## 2021-12-11 DIAGNOSIS — S32010A Wedge compression fracture of first lumbar vertebra, initial encounter for closed fracture: Secondary | ICD-10-CM | POA: Diagnosis not present

## 2021-12-11 LAB — MAGNESIUM: Magnesium: 2 mg/dL (ref 1.7–2.4)

## 2021-12-11 LAB — BASIC METABOLIC PANEL
Anion gap: 7 (ref 5–15)
BUN: 14 mg/dL (ref 8–23)
CO2: 25 mmol/L (ref 22–32)
Calcium: 8.4 mg/dL — ABNORMAL LOW (ref 8.9–10.3)
Chloride: 100 mmol/L (ref 98–111)
Creatinine, Ser: 0.64 mg/dL (ref 0.44–1.00)
GFR, Estimated: >60 mL/min (ref >60)
Glucose, Bld: 119 mg/dL — ABNORMAL HIGH (ref 70–99)
Potassium: 4.2 mmol/L (ref 3.5–5.1)
Sodium: 132 mmol/L — ABNORMAL LOW (ref 135–145)

## 2021-12-11 LAB — CBC
HCT: 30.9 % — ABNORMAL LOW (ref 36.0–46.0)
Hemoglobin: 9.8 g/dL — ABNORMAL LOW (ref 12.0–15.0)
MCH: 26.7 pg (ref 26.0–34.0)
MCHC: 31.7 g/dL (ref 30.0–36.0)
MCV: 84.2 fL (ref 80.0–100.0)
Platelets: 419 10*3/uL — ABNORMAL HIGH (ref 150–400)
RBC: 3.67 MIL/uL — ABNORMAL LOW (ref 3.87–5.11)
RDW: 16.5 % — ABNORMAL HIGH (ref 11.5–15.5)
WBC: 7.9 10*3/uL (ref 4.0–10.5)
nRBC: 0 % (ref 0.0–0.2)

## 2021-12-11 LAB — OSMOLALITY: Osmolality: 278 mOsm/kg (ref 275–295)

## 2021-12-11 LAB — PHOSPHORUS: Phosphorus: 4.3 mg/dL (ref 2.5–4.6)

## 2021-12-11 MED ORDER — CEFAZOLIN SODIUM-DEXTROSE 2-4 GM/100ML-% IV SOLN
2.0000 g | INTRAVENOUS | Status: AC
  Administered 2021-12-16: 2 g via INTRAVENOUS
  Filled 2021-12-11: qty 100

## 2021-12-11 MED ORDER — GABAPENTIN 600 MG PO TABS
600.0000 mg | ORAL_TABLET | Freq: Two times a day (BID) | ORAL | Status: DC
  Administered 2021-12-11 – 2021-12-19 (×15): 600 mg via ORAL
  Filled 2021-12-11 (×15): qty 1

## 2021-12-11 NOTE — Progress Notes (Signed)
[facility] Progress Note  Patient: Erin [name]    NUU:[id number]  DOA: 12/09/2021     Date of Service: the patient was seen and examined on 12/11/2021  Chief Complaint  Patient presents with   Fall   Brief hospital course: JENNET SCROGGIN is a 76 y.o. 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 ED Course: pt was found to have WBC 14.6, negative COVID PCR, GFR > 60, temperature normal, blood pressure 144/74, heart rate 86, RR 18, oxygen saturation 97% on room air.  CT scan of her pelvis is negative for acute injury.  X-ray of left hip/pelvis is negative.  MRI of the lumbar spine showed L1 compression fracture with 30% of height loss.  Patient is admitted to [place] bed as inpatient.  Dr. Rudene Christians of Ortho is consulted.   MRI-L spin: 1. Acute L1 compression fracture with 30% height loss and mild, noncompressive retropulsion. 2. Partially covered edema in the lower right sacrum, question insufficiency fracture at this level. 3. Lumbar spine degeneration with foraminal impingement on the left at L2-3 and to a lesser extent on the right at L5-S1     CT-head: 1. Progressive atrophy and white matter disease. This likely reflects the sequela of chronic microvascular ischemia. 2. 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 3. No acute intracranial abnormality or significant interval change. 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 Rudene Christians of orthopedic surgery for possible kyphoplasty. CT of head is negative for acute intracranial abnormalities. -LSO -Pain control: As needed morphine, Percocet, Tylenol, Lidoderm -As needed Robaxin Patient took Plavix, last dose was on 1/1, discontinued Plavix for washout before the procedure. 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  Patient has only trace leg edema, no respiratory distress.  Does not seem to have CHF exacerbation. 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  Interventions:      Diet: Heart healthy DVT Prophylaxis: Subcutaneous Lovenox   Advance goals of care discussion: DNR  Family Communication: family was present at  bedside, at the time of interview.  The pt provided permission to discuss medical plan with the family. Opportunity was given to ask question and all questions were answered satisfactorily.   Disposition:  Pt is from Home, admitted with fall and fracture of L1 vertebra, pending kyphoplasty by IR, which precludes a safe discharge. Discharge to home versus SNF, when kyphoplasty would be done.   IR planning to do it on Monday 1/9  Subjective: No significant overnight events, patient still has significant pain in the back 8/10 which is controlled with pain medications.  Patient denies any chest palpitation, no shortness of breath.  Physical Exam: General:  alert oriented to time, place, and person.  Appear in mild distress, affect appropriate Eyes: PERRLA ENT: Oral Mucosa Clear, moist  Neck: no JVD,  Cardiovascular: S1 and S2 Present, no Murmur,  Respiratory: good respiratory effort, Bilateral Air entry equal and Decreased, no Crackles, no wheezes Abdomen: Bowel Sound present, Soft and no tenderness,  Skin: no rashes Extremities: no Pedal edema, no calf tenderness Neurologic: without any new focal findings Gait not checked due to patient safety concerns  Vitals:   12/11/21 0327 12/11/21 0810 12/11/21 1207 12/11/21 1314  BP: 122/60 125/60 126/71   Pulse: 71 77 80   Resp: 17 17 17    Temp: 98.1 F (36.7 C) (!) 97.4 F (36.3 C) (!) 97.5 F (36.4 C)   TempSrc:      SpO2: 98% 91% (!) 87% 92%  Weight:      Height:        Intake/Output Summary (Last 24 hours) at 12/11/2021 1352 Last data filed at 12/11/2021 0900 Gross per 24 hour  Intake 240 ml  Output 400 ml  Net -160 ml   Filed Weights   12/09/21 0952  Weight: 70.8 kg    Data Reviewed: I have personally reviewed and interpreted daily labs, tele strips, imagings as discussed above. I reviewed all nursing notes, pharmacy notes, vitals, pertinent old records I have discussed plan of care as described above with RN and  patient/family.  CBC: Recent Labs  Lab 12/09/21 0512 12/10/21 0550 12/10/21 1015 12/11/21 0542  WBC 14.6* 10.6* 10.1 7.9  NEUTROABS 11.4*  --   --   --   HGB 9.6* 10.3* 10.3* 9.8*  HCT 29.9* 32.6* 31.9* 30.9*  MCV 85.9 86.5 85.5 84.2  PLT 353 371 379 284*   Basic Metabolic Panel: Recent Labs  Lab 12/09/21 0512 12/10/21 1015 12/11/21 0542  NA 132* 129* 132*  K 4.1 3.9 4.2  CL 99 99 100  CO2 24 19* 25  GLUCOSE 91 79 119*  BUN 12 14 14   CREATININE 0.76 0.79 0.64  CALCIUM 8.4* 8.0* 8.4*  MG  --  2.0 2.0  PHOS  --  5.4* 4.3    Studies: No results found.  Scheduled Meds:  amLODipine  5 mg Oral Daily   aspirin  81 mg Oral Daily   atorvastatin  40 mg Oral Daily   DULoxetine  60 mg Oral Daily   enoxaparin (LOVENOX) injection  0.5 mg/kg Subcutaneous Q24H   ezetimibe  10 mg Oral Daily   furosemide  20 mg Oral QODAY   gabapentin  600 mg Oral BID   hydroxychloroquine  200 mg Oral Daily   And  hydroxychloroquine  200 mg Oral QODAY   isosorbide mononitrate  15 mg Oral Daily   latanoprost  1 drop Both Eyes QHS   lidocaine  1 patch Transdermal Q24H   metoprolol tartrate  12.5 mg Oral BID   multivitamin with minerals  1 tablet Oral Daily   Nintedanib  150 mg Oral Daily   pantoprazole  40 mg Oral Daily   predniSONE  5 mg Oral Q breakfast   Continuous Infusions:  [START ON 12/16/2021]  ceFAZolin (ANCEF) IV     PRN Meds: acetaminophen, albuterol, ALPRAZolam, bisacodyl, dextromethorphan-guaiFENesin, hydrALAZINE, methocarbamol, morphine injection, nitroGLYCERIN, ondansetron (ZOFRAN) IV, oxyCODONE-acetaminophen, polyethylene glycol, zolpidem  Time spent: 35 minutes  Author: Val Riles. MD [facility] 12/11/2021 1:52 PM  To reach On-call, see care teams to locate the attending and reach out to them via [email or web address]. If 7PM-7AM, please contact night-coverage If you still have difficulty reaching the attending provider, please page the [facility] (Director on Call) for  [facility] on amion for assistance.

## 2021-12-11 NOTE — TOC Progression Note (Signed)
Transition of Care [facility]) - Progression Note    Patient Details  Name: Erin [name] MRN: [id number] Date of Birth: May 08, 1946  Transition of Care [facility]) CM/SW [place], RN Phone Number: 12/11/2021, 10:48 AM  Clinical Narrative:    TOC continues to Follow and will asssit in DC planning and needs, Kypho planned for late this week        Expected Discharge Plan and Services                                                 Social Determinants of Health (SDOH) Interventions    Readmission Risk Interventions Readmission Risk Prevention Plan 05/29/2019  Transportation Screening Not Complete  Transportation Screening Comment Pt was independent and driving prior to getting sick, when she become sick her cousin provided transportation however cousin is now sick - will need to address closer to [place] or Home Care Consult Patient refused  Medication Review (RN Care Manager) Complete  Some recent data might be hidden

## 2021-12-12 DIAGNOSIS — S32010A Wedge compression fracture of first lumbar vertebra, initial encounter for closed fracture: Secondary | ICD-10-CM | POA: Diagnosis not present

## 2021-12-12 LAB — CBC
HCT: 28.4 % — ABNORMAL LOW (ref 36.0–46.0)
Hemoglobin: 9 g/dL — ABNORMAL LOW (ref 12.0–15.0)
MCH: 26.6 pg (ref 26.0–34.0)
MCHC: 31.7 g/dL (ref 30.0–36.0)
MCV: 84 fL (ref 80.0–100.0)
Platelets: 414 10*3/uL — ABNORMAL HIGH (ref 150–400)
RBC: 3.38 MIL/uL — ABNORMAL LOW (ref 3.87–5.11)
RDW: 16.4 % — ABNORMAL HIGH (ref 11.5–15.5)
WBC: 8.3 10*3/uL (ref 4.0–10.5)
nRBC: 0 % (ref 0.0–0.2)

## 2021-12-12 LAB — BASIC METABOLIC PANEL
Anion gap: 9 (ref 5–15)
BUN: 14 mg/dL (ref 8–23)
CO2: 26 mmol/L (ref 22–32)
Calcium: 8.2 mg/dL — ABNORMAL LOW (ref 8.9–10.3)
Chloride: 97 mmol/L — ABNORMAL LOW (ref 98–111)
Creatinine, Ser: 0.72 mg/dL (ref 0.44–1.00)
GFR, Estimated: >60 mL/min (ref >60)
Glucose, Bld: 100 mg/dL — ABNORMAL HIGH (ref 70–99)
Potassium: 4.4 mmol/L (ref 3.5–5.1)
Sodium: 132 mmol/L — ABNORMAL LOW (ref 135–145)

## 2021-12-12 MED ORDER — POLYETHYLENE GLYCOL 3350 17 G PO PACK
17.0000 g | PACK | Freq: Every day | ORAL | Status: DC
  Administered 2021-12-12 – 2021-12-19 (×7): 17 g via ORAL
  Filled 2021-12-12 (×7): qty 1

## 2021-12-12 MED ORDER — CHLORHEXIDINE GLUCONATE CLOTH 2 % EX PADS
6.0000 | MEDICATED_PAD | Freq: Every day | CUTANEOUS | Status: DC
  Administered 2021-12-12 – 2021-12-19 (×8): 6 via TOPICAL

## 2021-12-12 MED ORDER — BISACODYL 5 MG PO TBEC
10.0000 mg | DELAYED_RELEASE_TABLET | Freq: Every day | ORAL | Status: DC | PRN
  Administered 2021-12-15 – 2021-12-17 (×2): 10 mg via ORAL
  Filled 2021-12-12: qty 2

## 2021-12-12 NOTE — Progress Notes (Signed)
[facility] Progress Note  Patient: Erin [name]    ZOX:[id number]  DOA: 12/09/2021     Date of Service: the patient was seen and examined on 12/12/2021  Chief Complaint  Patient presents with   Fall   Brief hospital course: Erin [name] is a 76 y.o. 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 ED Course: pt was found to have WBC 14.6, negative COVID PCR, GFR > 60, temperature normal, blood pressure 144/74, heart rate 86, RR 18, oxygen saturation 97% on room air.  CT scan of her pelvis is negative for acute injury.  X-ray of left hip/pelvis is negative.  MRI of the lumbar spine showed L1 compression fracture with 30% of height loss.  Patient is admitted to [employer] bed as inpatient.  Dr. Rudene Christians of Ortho is consulted.   MRI-L spin: 1. Acute L1 compression fracture with 30% height loss and mild, noncompressive retropulsion. 2. Partially covered edema in the lower right sacrum, question insufficiency fracture at this level. 3. Lumbar spine degeneration with foraminal impingement on the left at L2-3 and to a lesser extent on the right at L5-S1     CT-head: 1. Progressive atrophy and white matter disease. This likely reflects the sequela of chronic microvascular ischemia. 2. 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 3. No acute intracranial abnormality or significant interval change. 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 Rudene Christians of orthopedic surgery for possible kyphoplasty. CT of head is negative for acute intracranial abnormalities. -LSO -Pain control: As needed morphine, Percocet, Tylenol, Lidoderm -As needed Robaxin Patient took Plavix, last dose was on 1/1, discontinued Plavix for washout before the procedure. 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  Patient has only trace leg edema, no respiratory distress.  Does not seem to have CHF exacerbation. 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 Continue Foley catheter after 5 days of ambulation and then follow voiding trial with urology as an outpatient   Body  mass index is 30.47 kg/m.  Interventions:      Diet: Regular diet DVT Prophylaxis: Subcutaneous Lovenox   Advance goals of care discussion: DNR  Family Communication: family was present at bedside, at the time of interview.  The pt provided permission to discuss medical plan with the family. Opportunity was given to ask question and all questions were answered satisfactorily.   Disposition:  Pt is from Home, admitted with fall and fracture of L1 vertebra, pending kyphoplasty by IR, which precludes a safe discharge. Discharge to home versus SNF, when kyphoplasty would be done.   IR planning to do it on Monday 1/9  Subjective: No significant overnight events, patient was having significant discomfort secondary to urinary retention which relieved after Foley catheter insertion.  Patient still has back pain on movement.  Patient was advised to be careful while moving her back and ask for help.  Patient denied any other active issues.   Physical Exam: General:  alert oriented to time, place, and person.  Appear in mild distress, affect appropriate Eyes: PERRLA ENT: Oral Mucosa Clear, moist  Neck: no JVD,  Cardiovascular: S1 and S2 Present, no Murmur,  Respiratory: good respiratory effort, Bilateral Air entry equal and Decreased, no Crackles, no wheezes Abdomen: Bowel Sound present, Soft and no tenderness,  Skin: no rashes Extremities: no Pedal edema, no calf tenderness Neurologic: without any new focal findings Gait not checked due to patient safety concerns  Vitals:   12/11/21 2328 12/12/21 0328 12/12/21 0738 12/12/21 1121  BP: 122/65 (!) 119/56 133/61 116/60  Pulse: 78 71 71 96  Resp: 14 15 18 16   Temp: 98.6 F (37 C) 98.2 F (36.8 C) 97.7 F (36.5 C) (!) 97.5 F (36.4 C)  TempSrc:      SpO2: 95% 92% 92% 98%  Weight:      Height:        Intake/Output Summary (Last 24 hours) at 12/12/2021 1441 Last data filed at 12/12/2021 1030 Gross per 24 hour  Intake --  Output 2325  ml  Net -2325 ml   Filed Weights   12/09/21 0952  Weight: 70.8 kg    Data Reviewed: I have personally reviewed and interpreted daily labs, tele strips, imagings as discussed above. I reviewed all nursing notes, pharmacy notes, vitals, pertinent old records I have discussed plan of care as described above with RN and patient/family.  CBC: Recent Labs  Lab 12/09/21 0512 12/10/21 0550 12/10/21 1015 12/11/21 0542 12/12/21 0518  WBC 14.6* 10.6* 10.1 7.9 8.3  NEUTROABS 11.4*  --   --   --   --   HGB 9.6* 10.3* 10.3* 9.8* 9.0*  HCT 29.9* 32.6* 31.9* 30.9* 28.4*  MCV 85.9 86.5 85.5 84.2 84.0  PLT 353 371 379 419* 578*   Basic Metabolic Panel: Recent Labs  Lab 12/09/21 0512 12/10/21 1015 12/11/21 0542 12/12/21 0518  NA 132* 129* 132* 132*  K 4.1 3.9 4.2 4.4  CL 99 99 100 97*  CO2 24 19* 25 26  GLUCOSE 91 79 119* 100*  BUN 12 14 14 14   CREATININE 0.76 0.79 0.64 0.72  CALCIUM 8.4* 8.0* 8.4* 8.2*  MG  --  2.0 2.0  --   PHOS  --  5.4* 4.3  --     Studies: No  results found.  Scheduled Meds:  amLODipine  5 mg Oral Daily   aspirin  81 mg Oral Daily   atorvastatin  40 mg Oral Daily   Chlorhexidine Gluconate Cloth  6 each Topical Daily   DULoxetine  60 mg Oral Daily   enoxaparin (LOVENOX) injection  0.5 mg/kg Subcutaneous Q24H   ezetimibe  10 mg Oral Daily   furosemide  20 mg Oral QODAY   gabapentin  600 mg Oral BID   hydroxychloroquine  200 mg Oral Daily   And   hydroxychloroquine  200 mg Oral QODAY   isosorbide mononitrate  15 mg Oral Daily   latanoprost  1 drop Both Eyes QHS   lidocaine  1 patch Transdermal Q24H   metoprolol tartrate  12.5 mg Oral BID   multivitamin with minerals  1 tablet Oral Daily   Nintedanib  150 mg Oral Daily   pantoprazole  40 mg Oral Daily   polyethylene glycol  17 g Oral Daily   predniSONE  5 mg Oral Q breakfast   Continuous Infusions:  [START ON 12/16/2021]  ceFAZolin (ANCEF) IV     PRN Meds: acetaminophen, albuterol, ALPRAZolam,  bisacodyl, dextromethorphan-guaiFENesin, hydrALAZINE, methocarbamol, morphine injection, nitroGLYCERIN, ondansetron (ZOFRAN) IV, oxyCODONE-acetaminophen, zolpidem  Time spent: 35 minutes  Author: Val Riles. MD [facility] 12/12/2021 2:41 PM  To reach On-call, see care teams to locate the attending and reach out to them via [email or web address]. If 7PM-7AM, please contact night-coverage If you still have difficulty reaching the attending provider, please page the [facility] (Director on Call) for [facility] on amion for assistance.

## 2021-12-13 DIAGNOSIS — S32010A Wedge compression fracture of first lumbar vertebra, initial encounter for closed fracture: Secondary | ICD-10-CM | POA: Diagnosis not present

## 2021-12-13 LAB — IRON AND TIBC
Iron: 25 ug/dL — ABNORMAL LOW (ref 28–170)
Saturation Ratios: 10 % — ABNORMAL LOW (ref 10.4–31.8)
TIBC: 263 ug/dL (ref 250–450)
UIBC: 238 ug/dL

## 2021-12-13 LAB — CBC
HCT: 28.1 % — ABNORMAL LOW (ref 36.0–46.0)
Hemoglobin: 9 g/dL — ABNORMAL LOW (ref 12.0–15.0)
MCH: 26.9 pg (ref 26.0–34.0)
MCHC: 32 g/dL (ref 30.0–36.0)
MCV: 84.1 fL (ref 80.0–100.0)
Platelets: 437 10*3/uL — ABNORMAL HIGH (ref 150–400)
RBC: 3.34 MIL/uL — ABNORMAL LOW (ref 3.87–5.11)
RDW: 16.4 % — ABNORMAL HIGH (ref 11.5–15.5)
WBC: 8.2 10*3/uL (ref 4.0–10.5)
nRBC: 0 % (ref 0.0–0.2)

## 2021-12-13 LAB — BASIC METABOLIC PANEL
Anion gap: 7 (ref 5–15)
BUN: 11 mg/dL (ref 8–23)
CO2: 27 mmol/L (ref 22–32)
Calcium: 8.4 mg/dL — ABNORMAL LOW (ref 8.9–10.3)
Chloride: 100 mmol/L (ref 98–111)
Creatinine, Ser: 0.77 mg/dL (ref 0.44–1.00)
GFR, Estimated: >60 mL/min (ref >60)
Glucose, Bld: 88 mg/dL (ref 70–99)
Potassium: 4.2 mmol/L (ref 3.5–5.1)
Sodium: 134 mmol/L — ABNORMAL LOW (ref 135–145)

## 2021-12-13 LAB — VITAMIN D 25 HYDROXY (VIT D DEFICIENCY, FRACTURES): Vit D, 25-Hydroxy: 58.1 ng/mL (ref 30–100)

## 2021-12-13 LAB — VITAMIN B12: Vitamin B-12: 567 pg/mL (ref 180–914)

## 2021-12-13 LAB — FOLATE: Folate: 37 ng/mL (ref >5.9)

## 2021-12-13 MED ORDER — ASCORBIC ACID 500 MG PO TABS
500.0000 mg | ORAL_TABLET | Freq: Every day | ORAL | Status: DC
  Administered 2021-12-13 – 2021-12-19 (×7): 500 mg via ORAL
  Filled 2021-12-13 (×7): qty 1

## 2021-12-13 MED ORDER — POLYSACCHARIDE IRON COMPLEX 150 MG PO CAPS
150.0000 mg | ORAL_CAPSULE | Freq: Every day | ORAL | Status: DC
  Administered 2021-12-13 – 2021-12-19 (×6): 150 mg via ORAL
  Filled 2021-12-13 (×7): qty 1

## 2021-12-13 NOTE — Progress Notes (Signed)
[facility] Progress Note  Patient: Erin [name]    BDZ:[id number]  DOA: 12/09/2021     Date of Service: the patient was seen and examined on 12/13/2021  Chief Complaint  Patient presents with   Fall   Brief hospital course: TAIZ BICKLE is a 76 y.o. 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 ED Course: pt was found to have WBC 14.6, negative COVID PCR, GFR > 60, temperature normal, blood pressure 144/74, heart rate 86, RR 18, oxygen saturation 97% on room air.  CT scan of her pelvis is negative for acute injury.  X-ray of left hip/pelvis is negative.  MRI of the lumbar spine showed L1 compression fracture with 30% of height loss.  Patient is admitted to [place] bed as inpatient.  Dr. Rudene Christians of Ortho is consulted.   MRI-L spin: 1. Acute L1 compression fracture with 30% height loss and mild, noncompressive retropulsion. 2. Partially covered edema in the lower right sacrum, question insufficiency fracture at this level. 3. Lumbar spine degeneration with foraminal impingement on the left at L2-3 and to a lesser extent on the right at L5-S1     CT-head: 1. Progressive atrophy and white matter disease. This likely reflects the sequela of chronic microvascular ischemia. 2. 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 3. No acute intracranial abnormality or significant interval change. 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 Rudene Christians of orthopedic surgery for possible kyphoplasty. CT of head is negative for acute intracranial abnormalities. -LSO -Pain control: As needed morphine, Percocet, Tylenol, Lidoderm -As needed Robaxin Patient took Plavix, last dose was on 1/1, discontinued Plavix for washout before the procedure. IR consulted, need to wait for 5 days for Plavix washout and kyphoplasty will be done on 1/9 Monday  -PT/OT eval after kyphoplasty, patient may need SNF placement   Frequent falls, unknown cause Check vitamin B12 level, vitamin D level, folate and iron profile Iron deficiency, iron level 25, saturation 10%, started oral iron supplement with vitamin C.  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  Patient has only trace leg edema, no respiratory distress.  Does not seem to have CHF exacerbation. 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 Continue Foley catheter after 5 days of ambulation and then follow voiding trial with urology as an outpatient   Body mass index is 30.47 kg/m.  Interventions:      Diet: Regular diet DVT Prophylaxis: Subcutaneous Lovenox   Advance goals of care discussion: DNR  Family Communication: family was present at bedside, at the time of interview.  The pt provided permission to discuss medical plan with the family. Opportunity was given to ask question and all questions were answered satisfactorily.   Disposition:  Pt is from Home, admitted with fall and fracture of L1 vertebra, pending kyphoplasty by IR, which precludes a safe discharge. Discharge to home versus SNF, when kyphoplasty would be done.   IR planning to do it on Monday 1/9  Subjective: No significant overnight events, patient's pain is under control with medications, it was 6/10 in the morning time but she feels better after pain medications. Patient denies any chest pain or palpitation, no shortness of breath.   Physical Exam: General:  alert oriented to time, place, and person.  Appear in mild distress, affect appropriate Eyes: PERRLA ENT: Oral Mucosa Clear, moist  Neck: no JVD,  Cardiovascular: S1 and S2 Present, no Murmur,  Respiratory: good respiratory effort, Bilateral Air entry equal and Decreased, no Crackles, no wheezes Abdomen: Bowel Sound present, Soft and no tenderness,  Skin: no rashes Extremities: no Pedal edema, no calf tenderness Neurologic: without any new focal findings Gait not checked due to patient safety concerns  Vitals:   12/12/21 2324 12/13/21 0354 12/13/21 0833 12/13/21 1124  BP: (!) 105/50 120/70 (!) 120/54 (!) 117/55  Pulse: 74 77 82 69  Resp: 15 15 15 15   Temp: 98.3 F (36.8 C) 98.3 F (36.8 C) (!) 97.5 F (36.4 C) 97.8 F (36.6 C)  TempSrc:      SpO2: 94% 92% 100% 92%  Weight:      Height:         Intake/Output Summary (Last 24 hours) at 12/13/2021 1541 Last data filed at 12/13/2021 1433 Gross per 24 hour  Intake 480 ml  Output 3250 ml  Net -2770 ml   Filed Weights   12/09/21 0952  Weight: 70.8 kg    Data Reviewed: I have personally reviewed and interpreted daily labs, tele strips, imagings as discussed above. I reviewed all nursing notes, pharmacy notes, vitals, pertinent old records I have discussed plan of care as described above with RN and patient/family.  CBC: Recent Labs  Lab 12/09/21 0512 12/10/21 0550 12/10/21 1015 12/11/21 0542 12/12/21 0518 12/13/21 0607  WBC 14.6* 10.6* 10.1 7.9 8.3 8.2  NEUTROABS 11.4*  --   --   --   --   --   HGB 9.6* 10.3* 10.3* 9.8* 9.0* 9.0*  HCT 29.9* 32.6* 31.9* 30.9* 28.4* 28.1*  MCV 85.9 86.5 85.5 84.2 84.0 84.1  PLT 353 371 379 419* 414* 751*   Basic Metabolic Panel: Recent Labs  Lab 12/09/21 0512 12/10/21 1015 12/11/21 0542 12/12/21 0518 12/13/21 0607  NA 132* 129* 132* 132* 134*  K 4.1 3.9 4.2 4.4 4.2  CL 99 99 100 97* 100  CO2 24 19* 25 26 27   GLUCOSE 91 79 119* 100* 88  BUN 12 14 14  14  11  CREATININE 0.76 0.79 0.64 0.72 0.77  CALCIUM 8.4* 8.0* 8.4* 8.2* 8.4*  MG  --  2.0 2.0  --   --   PHOS  --  5.4* 4.3  --   --     Studies: No results found.  Scheduled Meds:  amLODipine  5 mg Oral Daily   aspirin  81 mg Oral Daily   atorvastatin  40 mg Oral Daily   Chlorhexidine Gluconate Cloth  6 each Topical Daily   DULoxetine  60 mg Oral Daily   enoxaparin (LOVENOX) injection  0.5 mg/kg Subcutaneous Q24H   ezetimibe  10 mg Oral Daily   furosemide  20 mg Oral QODAY   gabapentin  600 mg Oral BID   hydroxychloroquine  200 mg Oral Daily   And   hydroxychloroquine  200 mg Oral QODAY   isosorbide mononitrate  15 mg Oral Daily   latanoprost  1 drop Both Eyes QHS   lidocaine  1 patch Transdermal Q24H   metoprolol tartrate  12.5 mg Oral BID   multivitamin with minerals  1 tablet Oral Daily   Nintedanib  150 mg  Oral Daily   pantoprazole  40 mg Oral Daily   polyethylene glycol  17 g Oral Daily   predniSONE  5 mg Oral Q breakfast   Continuous Infusions:  [START ON 12/16/2021]  ceFAZolin (ANCEF) IV     PRN Meds: acetaminophen, albuterol, ALPRAZolam, bisacodyl, dextromethorphan-guaiFENesin, hydrALAZINE, methocarbamol, morphine injection, nitroGLYCERIN, ondansetron (ZOFRAN) IV, oxyCODONE-acetaminophen, zolpidem  Time spent: 35 minutes  Author: Val Riles. MD [facility] 12/13/2021 3:41 PM  To reach On-call, see care teams to locate the attending and reach out to them via [email or web address]. If 7PM-7AM, please contact night-coverage If you still have difficulty reaching the attending provider, please page the [facility] (Director on Call) for [facility] on amion for assistance.

## 2021-12-13 NOTE — Progress Notes (Signed)
See eMar at 0811.

## 2021-12-14 DIAGNOSIS — S32010A Wedge compression fracture of first lumbar vertebra, initial encounter for closed fracture: Secondary | ICD-10-CM | POA: Diagnosis not present

## 2021-12-14 LAB — BASIC METABOLIC PANEL
Anion gap: 7 (ref 5–15)
BUN: 12 mg/dL (ref 8–23)
CO2: 29 mmol/L (ref 22–32)
Calcium: 8.4 mg/dL — ABNORMAL LOW (ref 8.9–10.3)
Chloride: 101 mmol/L (ref 98–111)
Creatinine, Ser: 0.79 mg/dL (ref 0.44–1.00)
GFR, Estimated: >60 mL/min (ref >60)
Glucose, Bld: 83 mg/dL (ref 70–99)
Potassium: 4.2 mmol/L (ref 3.5–5.1)
Sodium: 137 mmol/L (ref 135–145)

## 2021-12-14 LAB — CBC
HCT: 30.5 % — ABNORMAL LOW (ref 36.0–46.0)
Hemoglobin: 9.7 g/dL — ABNORMAL LOW (ref 12.0–15.0)
MCH: 27.1 pg (ref 26.0–34.0)
MCHC: 31.8 g/dL (ref 30.0–36.0)
MCV: 85.2 fL (ref 80.0–100.0)
Platelets: 466 10*3/uL — ABNORMAL HIGH (ref 150–400)
RBC: 3.58 MIL/uL — ABNORMAL LOW (ref 3.87–5.11)
RDW: 16.4 % — ABNORMAL HIGH (ref 11.5–15.5)
WBC: 7.6 10*3/uL (ref 4.0–10.5)
nRBC: 0 % (ref 0.0–0.2)

## 2021-12-14 MED ORDER — FLUCONAZOLE 50 MG PO TABS
150.0000 mg | ORAL_TABLET | Freq: Once | ORAL | Status: AC
  Administered 2021-12-14: 150 mg via ORAL
  Filled 2021-12-14: qty 1

## 2021-12-14 MED ORDER — CALCITONIN (SALMON) 200 UNIT/ACT NA SOLN
1.0000 | Freq: Every day | NASAL | Status: DC
  Administered 2021-12-17: 1 via NASAL
  Filled 2021-12-14: qty 3.7

## 2021-12-14 NOTE — Progress Notes (Signed)
Vitals entered manually- dynamap didn't cross over

## 2021-12-14 NOTE — Progress Notes (Signed)
[facility] Progress Note  Patient: Erin [name]    NFA:[id number]  DOA: 12/09/2021     Date of Service: the patient was seen and examined on 12/14/2021  Chief Complaint  Patient presents with   Fall   Brief hospital course: Erin [name] is a 76 y.o. 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 ED Course: pt was found to have WBC 14.6, negative COVID PCR, GFR > 60, temperature normal, blood pressure 144/74, heart rate 86, RR 18, oxygen saturation 97% on room air.  CT scan of her pelvis is negative for acute injury.  X-ray of left hip/pelvis is negative.  MRI of the lumbar spine showed L1 compression fracture with 30% of height loss.  Patient is admitted to [place] bed as inpatient.  Dr. Rudene Christians of Ortho is consulted.   MRI-L spin: 1. Acute L1 compression fracture with 30% height loss and mild, noncompressive retropulsion. 2. Partially covered edema in the lower right sacrum, question insufficiency fracture at this level. 3. Lumbar spine degeneration with foraminal impingement on the left at L2-3 and to a lesser extent on the right at L5-S1     CT-head: 1. Progressive atrophy and white matter disease. This likely reflects the sequela of chronic microvascular ischemia. 2. 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 3. No acute intracranial abnormality or significant interval change. 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 Rudene Christians of orthopedic surgery for possible kyphoplasty. CT of head is negative for acute intracranial abnormalities. -LSO -Pain control: As needed morphine, Percocet, Tylenol, Lidoderm Started calcium nasal spray for pain control -As needed Robaxin Patient took Plavix, last dose was on 1/1, discontinued Plavix for washout before the procedure. IR consulted, need to wait for 5 days for Plavix washout and kyphoplasty will be done on 1/9 Monday  -PT/OT eval after kyphoplasty, patient may need SNF placement  Frequent falls, unknown cause vitamin B12 level, vitamin D level,  and folate levl WNL  Iron deficiency, iron level 25, saturation 10%, started oral iron supplement with vitamin C.  Idiopathic interstitial fibrosis of lung syndrome  -Bronchodilators -Continue home OFEV   Hyperlipidemia -Zetia, Lipitor   HTN, goal below 140/90 -IV hydralazine as needed -Continue home amlodipine, metoprolol,   Rheumatoid arthritis: pt states that she is taking prednisone 5 mg every other day now -Continue Plaquenil -Continue prednisone, increase to 5 mg daily -Give Solu-Cortef 50 mg as stress dose   [name] S/P percutaneous coronary angioplasty -Lipitor, Zetia, aspirin, Plavix, Imdur -Current nitroglycerin   Chronic diastolic CHF (congestive heart failure) ([place]): 2D echo on 05/30/2021 showed EF > 55%.  Patient has only trace leg edema, no respiratory distress.  Does not seem to have CHF exacerbation. 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 Continue Foley catheter after 5 days of ambulation and then follow voiding trial with urology as an outpatient  Vaginal discharge possible yeast infection, Diflucan 150 mg x 1 dose given on 1/7, may repeat another dose after 72 hours if still symptomatic.  Body mass index is 30.47 kg/m.  Interventions:      Diet: Regular diet DVT Prophylaxis: Subcutaneous Lovenox   Advance goals of care discussion: DNR  Family Communication: family was present at bedside, at the time of interview.  The pt provided permission to discuss medical plan with the family. Opportunity was given to ask question and all questions were answered satisfactorily.   Disposition:  Pt is from Home, admitted with fall and fracture of L1 vertebra, pending kyphoplasty by IR, which precludes a safe discharge. Discharge to home versus SNF, when kyphoplasty would be done.   IR planning to do it on Monday 1/9  Subjective: No significant overnight events, had more pain today due to movement, she was given bedside bath.  Pain level was 8/10.  Patient also complaining of some vaginal discharge possible yeast infection.  No any other complaints today.    Physical Exam: General:  alert oriented to time, place, and person.  Appear in mild distress, affect appropriate Eyes: PERRLA ENT: Oral Mucosa Clear, moist  Neck: no JVD,  Cardiovascular: S1 and S2 Present, no Murmur,  Respiratory: good respiratory effort, Bilateral Air entry equal and Decreased, no Crackles, no wheezes Abdomen: Bowel Sound present, Soft and no tenderness,  Skin: no rashes Extremities: no Pedal edema, no calf tenderness Neurologic: without any new focal findings Gait not checked due to patient safety concerns  Vitals:   12/13/21 1900 12/14/21 0022 12/14/21 0322 12/14/21 0830  BP:  (!) 110/57 (!) 121/58 (!) 131/49  Pulse: 68 69 63 60  Resp: 15 18 15 16    Temp: 98.1 F (36.7 C) 98 F (36.7 C) 98.3 F (36.8 C) 98.5 F (36.9 C)  TempSrc:      SpO2: 96% 94% 90% 92%  Weight:      Height:        Intake/Output Summary (Last 24 hours) at 12/14/2021 1518 Last data filed at 12/14/2021 1100 Gross per 24 hour  Intake 460 ml  Output 2150 ml  Net -1690 ml   Filed Weights   12/09/21 0952  Weight: 70.8 kg    Data Reviewed: I have personally reviewed and interpreted daily labs, tele strips, imagings as discussed above. I reviewed all nursing notes, pharmacy notes, vitals, pertinent old records I have discussed plan of care as described above with RN and patient/family.  CBC: Recent Labs  Lab 12/09/21 0512 12/10/21 0550 12/10/21 1015 12/11/21 0542 12/12/21 0518 12/13/21 0607 12/14/21 0704  WBC 14.6*   < > 10.1 7.9 8.3 8.2 7.6  NEUTROABS 11.4*  --   --   --   --   --   --   HGB 9.6*   < > 10.3* 9.8* 9.0* 9.0* 9.7*  HCT 29.9*   < > 31.9* 30.9* 28.4* 28.1* 30.5*  MCV 85.9   < > 85.5 84.2 84.0 84.1 85.2  PLT 353   < > 379 419* 414* 437* 466*   < > =  values in this interval not displayed.   Basic Metabolic Panel: Recent Labs  Lab 12/10/21 1015 12/11/21 0542 12/12/21 0518 12/13/21 0607 12/14/21 0704  NA 129* 132* 132* 134* 137  K 3.9 4.2 4.4 4.2 4.2  CL 99 100 97* 100 101  CO2 19* 25 26 27 29   GLUCOSE 79 119* 100* 88 83  BUN 14 14 14 11 12   CREATININE 0.79 0.64 0.72 0.77 0.79  CALCIUM 8.0* 8.4* 8.2* 8.4* 8.4*  MG 2.0 2.0  --   --   --   PHOS 5.4* 4.3  --   --   --     Studies: No results found.  Scheduled Meds:  amLODipine  5 mg Oral Daily   vitamin C  500 mg Oral Daily   aspirin  81 mg Oral Daily   atorvastatin  40 mg Oral Daily   calcitonin (salmon)  1 spray Alternating Nares Daily   Chlorhexidine Gluconate Cloth  6 each Topical Daily   DULoxetine  60 mg Oral Daily   enoxaparin (LOVENOX) injection  0.5 mg/kg Subcutaneous Q24H   ezetimibe  10 mg Oral Daily   furosemide  20 mg Oral QODAY   gabapentin  600 mg Oral  BID   hydroxychloroquine  200 mg Oral Daily   And   hydroxychloroquine  200 mg Oral QODAY   iron polysaccharides  150 mg Oral Daily   isosorbide mononitrate  15 mg Oral Daily   latanoprost  1 drop Both Eyes QHS   lidocaine  1 patch Transdermal Q24H   metoprolol tartrate  12.5 mg Oral BID   multivitamin with minerals  1 tablet Oral Daily   Nintedanib  150 mg Oral Daily   pantoprazole  40 mg Oral Daily   polyethylene glycol  17 g Oral Daily   predniSONE  5 mg Oral Q breakfast   Continuous Infusions:  [START ON 12/16/2021]  ceFAZolin (ANCEF) IV     PRN Meds: acetaminophen, albuterol, ALPRAZolam, bisacodyl, dextromethorphan-guaiFENesin, hydrALAZINE, methocarbamol, morphine injection, nitroGLYCERIN, ondansetron (ZOFRAN) IV, oxyCODONE-acetaminophen, zolpidem  Time spent: 35 minutes  Author: Val Riles. MD [facility] 12/14/2021 3:18 PM  To reach On-call, see care teams to locate the attending and reach out to them via [email or web address]. If 7PM-7AM, please contact night-coverage If you still have difficulty reaching the attending provider, please page the [facility] (Director on Call) for [facility] on amion for assistance.

## 2021-12-14 NOTE — Plan of Care (Signed)

## 2021-12-15 DIAGNOSIS — S32010A Wedge compression fracture of first lumbar vertebra, initial encounter for closed fracture: Secondary | ICD-10-CM | POA: Diagnosis not present

## 2021-12-15 LAB — BASIC METABOLIC PANEL
Anion gap: 8 (ref 5–15)
BUN: 16 mg/dL (ref 8–23)
CO2: 27 mmol/L (ref 22–32)
Calcium: 8.3 mg/dL — ABNORMAL LOW (ref 8.9–10.3)
Chloride: 99 mmol/L (ref 98–111)
Creatinine, Ser: 0.76 mg/dL (ref 0.44–1.00)
GFR, Estimated: >60 mL/min (ref >60)
Glucose, Bld: 93 mg/dL (ref 70–99)
Potassium: 4.1 mmol/L (ref 3.5–5.1)
Sodium: 134 mmol/L — ABNORMAL LOW (ref 135–145)

## 2021-12-15 LAB — CBC
HCT: 28.2 % — ABNORMAL LOW (ref 36.0–46.0)
Hemoglobin: 8.8 g/dL — ABNORMAL LOW (ref 12.0–15.0)
MCH: 26.7 pg (ref 26.0–34.0)
MCHC: 31.2 g/dL (ref 30.0–36.0)
MCV: 85.5 fL (ref 80.0–100.0)
Platelets: 481 10*3/uL — ABNORMAL HIGH (ref 150–400)
RBC: 3.3 MIL/uL — ABNORMAL LOW (ref 3.87–5.11)
RDW: 16.5 % — ABNORMAL HIGH (ref 11.5–15.5)
WBC: 10.3 10*3/uL (ref 4.0–10.5)
nRBC: 0 % (ref 0.0–0.2)

## 2021-12-15 NOTE — Progress Notes (Signed)
[facility] Progress Note  Patient: Erin [name]    EHM:[id number]  DOA: 12/09/2021     Date of Service: the patient was seen and examined on 12/15/2021  Chief Complaint  Patient presents with   Fall   Brief hospital course: KELLIS TOPETE is a 76 y.o. 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 ED Course: pt was found to have WBC 14.6, negative COVID PCR, GFR > 60, temperature normal, blood pressure 144/74, heart rate 86, RR 18, oxygen saturation 97% on room air.  CT scan of her pelvis is negative for acute injury.  X-ray of left hip/pelvis is negative.  MRI of the lumbar spine showed L1 compression fracture with 30% of height loss.  Patient is admitted to [facility] bed as inpatient.  Dr. Rudene Christians of Ortho is consulted.   MRI-L spin: 1. Acute L1 compression fracture with 30% height loss and mild, noncompressive retropulsion. 2. Partially covered edema in the lower right sacrum, question insufficiency fracture at this level. 3. Lumbar spine degeneration with foraminal impingement on the left at L2-3 and to a lesser extent on the right at L5-S1     CT-head: 1. Progressive atrophy and white matter disease. This likely reflects the sequela of chronic microvascular ischemia. 2. 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 3. No acute intracranial abnormality or significant interval change. 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 Rudene Christians of orthopedic surgery for possible kyphoplasty. CT of head is negative for acute intracranial abnormalities. -continue LSO brace -Pain control: As needed morphine, Percocet, Tylenol, Lidoderm Started calcium nasal spray for pain control -As needed Robaxin Patient took Plavix, last dose was on 1/1, discontinued Plavix for washout before the procedure. IR consulted, need to wait for 5 days for Plavix washout and kyphoplasty will be done on 1/9 Monday  -PT/OT eval after kyphoplasty, patient may need SNF placement  Frequent falls, unknown cause vitamin B12 level, vitamin D level,  and folate levl WNL  Iron deficiency, iron level 25, saturation 10%, started oral iron supplement with vitamin C.  Idiopathic interstitial fibrosis of lung syndrome  -Bronchodilators -Continue home OFEV   Hyperlipidemia -Zetia, Lipitor   HTN, goal below 140/90 -IV hydralazine as needed -Continue home amlodipine, metoprolol,   Rheumatoid arthritis: pt states that she is taking prednisone 5 mg every other day now -Continue Plaquenil -Continue prednisone, increase to 5 mg daily -Give Solu-Cortef 50 mg as stress dose   [name] S/P percutaneous coronary angioplasty -Lipitor, Zetia, aspirin, Plavix, Imdur -Current nitroglycerin   Chronic diastolic CHF (congestive heart failure) ([place]): 2D echo on 05/30/2021 showed EF > 55%.  Patient has only trace leg edema, no respiratory distress.  Does not seem to have CHF exacerbation. 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 Continue Foley catheter after 5 days of ambulation and then follow voiding trial with urology as an outpatient  Vaginal discharge possible yeast infection, Diflucan 150 mg x 1 dose given on 1/7, may repeat another dose after 72 hours if still symptomatic.  Body mass index is 30.47 kg/m.  Interventions:      Diet: Regular diet DVT Prophylaxis: Subcutaneous Lovenox   Advance goals of care discussion: DNR  Family Communication: family was present at bedside, at the time of interview.  The pt provided permission to discuss medical plan with the family. Opportunity was given to ask question and all questions were answered satisfactorily.   Disposition:  Pt is from Home, admitted with fall and fracture of L1 vertebra, pending kyphoplasty by IR, which precludes a safe discharge. Discharge to home versus SNF, when kyphoplasty would be done.   IR planning to do it on Monday 1/9  Subjective: No significant overnight events, back pain is 6/10, patient was about to ask for pain medication.  Denied any chest pain or palpitations, no any other active issues. Patient was concerned about the kyphoplasty tomorrow regarding time of the procedure and who will be doing it.  We will find out from the IR most likely tomorrow a.m.  Physical Exam: General:  alert oriented to time, place, and person.  Appear in mild distress, affect appropriate Eyes: PERRLA ENT: Oral Mucosa Clear, moist  Neck: no JVD,  Cardiovascular: S1 and S2 Present, no Murmur,  Respiratory: good respiratory effort, Bilateral Air entry equal and Decreased, no Crackles, no wheezes Abdomen: Bowel Sound present, Soft and no tenderness,  Skin: no rashes Extremities: no Pedal edema, no calf tenderness Neurologic: without any new focal findings Gait not checked due to patient safety concerns  Vitals:    12/14/21 1617 12/14/21 2029 12/15/21 0333 12/15/21 0802  BP: 108/60 126/62 (!) 107/54 (!) 120/55  Pulse: 84 75 68 71  Resp: 16 16 16 15   Temp: 98.2 F (36.8 C) 98.2 F (36.8 C) 98.1 F (36.7 C) 98.1 F (36.7 C)  TempSrc:      SpO2: 95% 99% 94% 93%  Weight:      Height:        Intake/Output Summary (Last 24 hours) at 12/15/2021 1334 Last data filed at 12/15/2021 0805 Gross per 24 hour  Intake --  Output 1100 ml  Net -1100 ml   Filed Weights   12/09/21 0952  Weight: 70.8 kg    Data Reviewed: I have personally reviewed and interpreted daily labs, tele strips, imagings as discussed above. I reviewed all nursing notes, pharmacy notes, vitals, pertinent old records I have discussed plan of care as described above with RN and patient/family.  CBC: Recent Labs  Lab 12/09/21 0512 12/10/21 0550 12/11/21 0542 12/12/21 0518 12/13/21 0607 12/14/21 0704 12/15/21 0500  WBC 14.6*   < > 7.9 8.3 8.2 7.6 10.3  NEUTROABS 11.4*  --   --   --   --   --   --   HGB 9.6*   < > 9.8* 9.0* 9.0* 9.7* 8.8*  HCT 29.9*   < > 30.9* 28.4* 28.1* 30.5* 28.2*  MCV 85.9   < > 84.2 84.0 84.1  85.2 85.5  PLT 353   < > 419* 414* 437* 466* 481*   < > = values in this interval not displayed.   Basic Metabolic Panel: Recent Labs  Lab 12/10/21 1015 12/11/21 0542 12/12/21 0518 12/13/21 0607 12/14/21 0704 12/15/21 0500  NA 129* 132* 132* 134* 137 134*  K 3.9 4.2 4.4 4.2 4.2 4.1  CL 99 100 97* 100 101 99  CO2 19* 25 26 27 29 27   GLUCOSE 79 119* 100* 88 83 93  BUN 14 14 14 11 12 16   CREATININE 0.79 0.64 0.72 0.77 0.79 0.76  CALCIUM 8.0* 8.4* 8.2* 8.4* 8.4* 8.3*  MG 2.0 2.0  --   --   --   --   PHOS 5.4* 4.3  --   --   --   --     Studies: No results found.  Scheduled Meds:  amLODipine  5 mg Oral Daily   vitamin C  500 mg Oral Daily   aspirin  81 mg Oral Daily   atorvastatin  40 mg Oral Daily   calcitonin (salmon)  1 spray Alternating Nares Daily   Chlorhexidine Gluconate Cloth  6 each  Topical Daily   DULoxetine  60 mg Oral Daily   enoxaparin (LOVENOX) injection  0.5 mg/kg Subcutaneous Q24H   ezetimibe  10 mg Oral Daily   furosemide  20 mg Oral QODAY   gabapentin  600 mg Oral BID   hydroxychloroquine  200 mg Oral Daily   And   hydroxychloroquine  200 mg Oral QODAY   iron polysaccharides  150 mg Oral Daily   isosorbide mononitrate  15 mg Oral Daily   latanoprost  1 drop Both Eyes QHS   lidocaine  1 patch Transdermal Q24H   metoprolol tartrate  12.5 mg Oral BID   multivitamin with minerals  1 tablet Oral Daily   pantoprazole  40 mg Oral Daily   polyethylene glycol  17 g Oral Daily   predniSONE  5 mg Oral Q breakfast   Continuous Infusions:  [START ON 12/16/2021]  ceFAZolin (ANCEF) IV     PRN Meds: acetaminophen, albuterol, ALPRAZolam, bisacodyl, dextromethorphan-guaiFENesin, hydrALAZINE, methocarbamol, morphine injection, nitroGLYCERIN, ondansetron (ZOFRAN) IV, oxyCODONE-acetaminophen, zolpidem  Time spent: 35 minutes  Author: Val Riles. MD [facility] 12/15/2021 1:34 PM  To reach On-call, see care teams to locate the attending and reach out to them via [email or web address]. If 7PM-7AM, please contact night-coverage If you still have difficulty reaching the attending provider, please page the [facility] (Director on Call) for [facility] on amion for assistance.

## 2021-12-15 NOTE — Plan of Care (Signed)
  Problem: Health Behavior/Discharge Planning: Goal: Ability to manage health-related needs will improve Outcome: Progressing   Problem: Clinical Measurements: Goal: Ability to maintain clinical measurements within normal limits will improve Outcome: Progressing   

## 2021-12-15 NOTE — Plan of Care (Signed)

## 2021-12-16 ENCOUNTER — Inpatient Hospital Stay: Payer: Medicare Other | Admitting: Radiology

## 2021-12-16 DIAGNOSIS — S32010A Wedge compression fracture of first lumbar vertebra, initial encounter for closed fracture: Secondary | ICD-10-CM | POA: Diagnosis not present

## 2021-12-16 HISTORY — PX: IR KYPHO LUMBAR INC FX REDUCE BONE BX UNI/BIL CANNULATION INC/IMAGING: IMG5519

## 2021-12-16 LAB — CBC
HCT: 29.1 % — ABNORMAL LOW (ref 36.0–46.0)
Hemoglobin: 9 g/dL — ABNORMAL LOW (ref 12.0–15.0)
MCH: 26.5 pg (ref 26.0–34.0)
MCHC: 30.9 g/dL (ref 30.0–36.0)
MCV: 85.6 fL (ref 80.0–100.0)
Platelets: 468 10*3/uL — ABNORMAL HIGH (ref 150–400)
RBC: 3.4 MIL/uL — ABNORMAL LOW (ref 3.87–5.11)
RDW: 16.5 % — ABNORMAL HIGH (ref 11.5–15.5)
WBC: 9.9 10*3/uL (ref 4.0–10.5)
nRBC: 0 % (ref 0.0–0.2)

## 2021-12-16 LAB — BASIC METABOLIC PANEL
Anion gap: 6 (ref 5–15)
BUN: 15 mg/dL (ref 8–23)
CO2: 29 mmol/L (ref 22–32)
Calcium: 8.4 mg/dL — ABNORMAL LOW (ref 8.9–10.3)
Chloride: 98 mmol/L (ref 98–111)
Creatinine, Ser: 0.67 mg/dL (ref 0.44–1.00)
GFR, Estimated: >60 mL/min (ref >60)
Glucose, Bld: 96 mg/dL (ref 70–99)
Potassium: 4 mmol/L (ref 3.5–5.1)
Sodium: 133 mmol/L — ABNORMAL LOW (ref 135–145)

## 2021-12-16 LAB — MAGNESIUM: Magnesium: 1.9 mg/dL (ref 1.7–2.4)

## 2021-12-16 LAB — PHOSPHORUS: Phosphorus: 4.6 mg/dL (ref 2.5–4.6)

## 2021-12-16 MED ORDER — FENTANYL CITRATE (PF) 100 MCG/2ML IJ SOLN
INTRAMUSCULAR | Status: AC
  Filled 2021-12-16: qty 2

## 2021-12-16 MED ORDER — MIDAZOLAM HCL 2 MG/2ML IJ SOLN
INTRAMUSCULAR | Status: AC | PRN
Start: 2021-12-16 — End: 2021-12-16
  Administered 2021-12-16 (×3): .5 mg via INTRAVENOUS
  Administered 2021-12-16 (×3): 1 mg via INTRAVENOUS

## 2021-12-16 MED ORDER — CEFAZOLIN SODIUM-DEXTROSE 2-4 GM/100ML-% IV SOLN
INTRAVENOUS | Status: AC | PRN
  Administered 2021-12-16: 2 g via INTRAVENOUS

## 2021-12-16 MED ORDER — MIDAZOLAM HCL 2 MG/2ML IJ SOLN
INTRAMUSCULAR | Status: AC
  Filled 2021-12-16: qty 2

## 2021-12-16 MED ORDER — FENTANYL CITRATE (PF) 100 MCG/2ML IJ SOLN
INTRAMUSCULAR | Status: AC | PRN
  Administered 2021-12-16: 25 ug via INTRAVENOUS
  Administered 2021-12-16: 50 ug via INTRAVENOUS
  Administered 2021-12-16 (×2): 25 ug via INTRAVENOUS
  Administered 2021-12-16: 50 ug via INTRAVENOUS
  Administered 2021-12-16: 25 ug via INTRAVENOUS

## 2021-12-16 MED ORDER — LIDOCAINE HCL (PF) 1 % IJ SOLN
INTRAMUSCULAR | Status: AC
  Administered 2021-12-16: 20 mL
  Filled 2021-12-16: qty 30

## 2021-12-16 MED ORDER — CEFAZOLIN SODIUM-DEXTROSE 2-4 GM/100ML-% IV SOLN
INTRAVENOUS | Status: AC
  Filled 2021-12-16: qty 100

## 2021-12-16 MED ORDER — SODIUM CHLORIDE 0.9 % IV SOLN
INTRAVENOUS | Status: AC | PRN
  Administered 2021-12-16: 10 mL/h via INTRAVENOUS

## 2021-12-16 NOTE — Procedures (Signed)
Vascular and Interventional Radiology Procedure Note  Patient: AVREE SZCZYGIEL DOB: [birth year] Medical Record Number: [id number] Note Date/Time: 12/16/21 2:51 PM   Performing Physician: Michaelle Birks, MD Assistant(s): None  Diagnosis: Symptomatic L1 vertebral body fracture.  Procedure: L1 VERTEBRAL BODY KYPHOPLASTY  Anesthesia: Conscious Sedation Complications: None Estimated Blood Loss: Minimal Specimens: Sent for None  Findings:  Successful Fluoroscopy-guided L1 vertebral body, bi-pedicular Kyphoplasty. A total of 8 mL PMMA was used. Hemostasis of the tract was achieved using Manual Pressure.  Plan: Bed rest for 2 hours.  See detailed procedure note with images in PACS. The patient tolerated the procedure well without incident or complication and was returned to Recovery in stable condition.    Michaelle Birks, MD [facility] Radiology   Pager. [place]

## 2021-12-16 NOTE — Plan of Care (Signed)

## 2021-12-16 NOTE — Interval H&P Note (Signed)
VASCULAR and INTERVENTIONAL RADIOLOGY  PRE OP NOTE   I reviewed the APP's note, and agree with the findings and plan as described.   Imaging Independently reviewed, showing L1 vertebral compression deformity with edema on MR OK to proceed with L1 VERTEBRAL AUGMENTATION in Interventional Radiology. The procedure has been fully reviewed with the patient/patient's authorized representative. The risks, benefits and alternatives have been explained, and the patient/patient's authorized representative has consented to the procedure.  There have been no history and physical interval changes.   ASA 3, Airway 3.  Michaelle Birks, MD [facility] Radiology   Pager. [place]

## 2021-12-16 NOTE — Progress Notes (Addendum)
[facility] Progress Note  Patient: Erin [name]    DTO:[id number]  DOA: 12/09/2021     Date of Service: the patient was seen and examined on 12/16/2021  Chief Complaint  Patient presents with   Fall   Brief hospital course: Erin [name] is a 76 y.o. 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 ED Course: pt was found to have WBC 14.6, negative COVID PCR, GFR > 60, temperature normal, blood pressure 144/74, heart rate 86, RR 18, oxygen saturation 97% on room air.  CT scan of her pelvis is negative for acute injury.  X-ray of left hip/pelvis is negative.  MRI of the lumbar spine showed L1 compression fracture with 30% of height loss.  Patient is admitted to [place] bed as inpatient.  Dr. Rudene Christians of Ortho is consulted.   MRI-L spin: 1. Acute L1 compression fracture with 30% height loss and mild, noncompressive retropulsion. 2. Partially covered edema in the lower right sacrum, question insufficiency fracture at this level. 3. Lumbar spine degeneration with foraminal impingement on the left at L2-3 and to a lesser extent on the right at L5-S1     CT-head: 1. Progressive atrophy and white matter disease. This likely reflects the sequela of chronic microvascular ischemia. 2. 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 3. No acute intracranial abnormality or significant interval change. 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 Rudene Christians of orthopedic surgery for possible kyphoplasty. CT of head is negative for acute intracranial abnormalities. -continue LSO brace -Pain control: As needed morphine, Percocet, Tylenol, Lidoderm Started calcium nasal spray for pain control -As needed Robaxin Patient took Plavix, last dose was on 1/1, discontinued Plavix for washout before the procedure. IR consulted, waited for 5 days for Plavix washout and kyphoplasty was done on 1/9 Monday S/p Successful Fluoroscopy-guided L1 vertebral body, bi-pedicular Kyphoplasty. A total of 8 mL PMMA was used. Hemostasis of the tract was achieved using Manual Pressure  Follow IR when to resume Plavix ?? -PT/OT eval after kyphoplasty, patient may need SNF placement   Frequent falls, unknown cause vitamin B12 level, vitamin D level,  and folate levl WNL  Iron deficiency, iron level 25, saturation 10%, started oral iron supplement with vitamin C.  Idiopathic interstitial fibrosis of lung syndrome  -Bronchodilators -Continue home OFEV   Hyperlipidemia -Zetia, Lipitor   HTN, goal below 140/90 -IV hydralazine as needed -Continue home amlodipine, metoprolol,   Rheumatoid arthritis: pt states that she is taking prednisone 5 mg every other day now -Continue Plaquenil -Continue prednisone, increase to 5 mg daily -Give Solu-Cortef 50 mg as stress dose   [name] S/P percutaneous coronary angioplasty -Lipitor, Zetia, aspirin, Plavix, Imdur -Current nitroglycerin   Chronic diastolic CHF (congestive heart failure) ([place]): 2D echo on 05/30/2021 showed EF > 55%.  Patient  has only trace leg edema, no respiratory distress.  Does not seem to have CHF exacerbation. 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 Continue Foley catheter after 5 days of ambulation and then follow voiding trial with urology as an outpatient  Vaginal discharge possible yeast infection, Diflucan 150 mg x 1 dose given on 1/7, may repeat another dose after 72 hours if still symptomatic.  Body mass index is 30.47 kg/m.  Interventions:      Diet: Regular diet DVT Prophylaxis: Subcutaneous Lovenox   Advance goals of care discussion: DNR  Family Communication: family was present at bedside, at the time of interview.  The pt provided permission to discuss medical plan with the family. Opportunity was given to ask question and all questions were answered satisfactorily.   Disposition:  Pt is from Home, admitted with fall and fracture of L1 vertebra, s/p kyphoplasty by IR done today on 1/9, which precludes a safe discharge. Discharge to home versus SNF pending PT/OT eval, can be discharged in 1 to 2 days if remains stable.  Subjective: No significant overnight events, pain is under control with medications, patient was awaiting for the procedure kyphoplasty. Patient denied any chest pain or palpitation, no shortness of breath.   Physical Exam: General:  alert oriented to time, place, and person.  Appear in mild distress, affect appropriate Eyes: PERRLA ENT: Oral Mucosa Clear, moist  Neck: no JVD,  Cardiovascular: S1 and S2 Present, no Murmur,  Respiratory: good respiratory effort, Bilateral Air entry equal and Decreased, no Crackles, no wheezes Abdomen: Bowel Sound present, Soft and no tenderness,  Skin: no rashes Extremities: no Pedal edema, no calf tenderness Neurologic: without any new  focal findings Gait not checked due to patient safety concerns  Vitals:   12/16/21 1515 12/16/21 1530 12/16/21 1600 12/16/21 1624  BP: (!) 117/48 (!) 106/47 (!) 101/49 (!) 120/55  Pulse: 74 73 75 (!) 58  Resp: 16 14 13 18   Temp:    98.6 F (37 C)  TempSrc:      SpO2: 94% 94% 96% 92%  Weight:      Height:        Intake/Output Summary (Last 24 hours) at 12/16/2021 1633 Last data filed at 12/16/2021 1660 Gross per 24 hour  Intake --  Output 1900 ml  Net -1900 ml   Filed Weights   12/09/21 0952  Weight: 70.8 kg    Data Reviewed: I have personally reviewed and interpreted daily labs, tele strips, imagings as discussed above. I reviewed all nursing notes, pharmacy notes, vitals, pertinent old records I have discussed plan of care as described above with RN and patient/family.  CBC: Recent Labs  Lab 12/12/21 0518 12/13/21 0607 12/14/21 0704 12/15/21 0500 12/16/21 0447  WBC 8.3 8.2 7.6 10.3 9.9  HGB 9.0* 9.0* 9.7* 8.8* 9.0*  HCT 28.4* 28.1* 30.5* 28.2* 29.1*  MCV 84.0 84.1 85.2 85.5 85.6  PLT 414* 437* 466* 481* 630*   Basic Metabolic Panel: Recent Labs  Lab 12/10/21 1015 12/11/21 0542 12/12/21 0518 12/13/21 0607 12/14/21 0704 12/15/21 0500 12/16/21 0447  NA 129* 132* 132* 134* 137 134* 133*  K 3.9 4.2 4.4 4.2  4.2 4.1 4.0  CL 99 100 97* 100 101 99 98  CO2 19* 25 26 27 29 27 29   GLUCOSE 79 119* 100* 88 83 93 96  BUN 14 14 14 11 12 16 15   CREATININE 0.79 0.64 0.72 0.77 0.79 0.76 0.67  CALCIUM 8.0* 8.4* 8.2* 8.4* 8.4* 8.3* 8.4*  MG 2.0 2.0  --   --   --   --  1.9  PHOS 5.4* 4.3  --   --   --   --  4.6    Studies: No results found.  Scheduled Meds:  amLODipine  5 mg Oral Daily   vitamin C  500 mg Oral Daily   aspirin  81 mg Oral Daily   atorvastatin  40 mg Oral Daily   calcitonin (salmon)  1 spray Alternating Nares Daily   Chlorhexidine Gluconate Cloth  6 each Topical Daily   DULoxetine  60 mg Oral Daily   enoxaparin (LOVENOX) injection  0.5 mg/kg  Subcutaneous Q24H   ezetimibe  10 mg Oral Daily   fentaNYL       fentaNYL       furosemide  20 mg Oral QODAY   gabapentin  600 mg Oral BID   hydroxychloroquine  200 mg Oral Daily   And   hydroxychloroquine  200 mg Oral QODAY   iron polysaccharides  150 mg Oral Daily   isosorbide mononitrate  15 mg Oral Daily   latanoprost  1 drop Both Eyes QHS   lidocaine  1 patch Transdermal Q24H   metoprolol tartrate  12.5 mg Oral BID   midazolam       midazolam       multivitamin with minerals  1 tablet Oral Daily   pantoprazole  40 mg Oral Daily   polyethylene glycol  17 g Oral Daily   predniSONE  5 mg Oral Q breakfast   Continuous Infusions:   PRN Meds: acetaminophen, albuterol, ALPRAZolam, bisacodyl, dextromethorphan-guaiFENesin, hydrALAZINE, methocarbamol, morphine injection, nitroGLYCERIN, ondansetron (ZOFRAN) IV, oxyCODONE-acetaminophen, zolpidem  Time spent: 35 minutes  Author: Val Riles. MD [facility] 12/16/2021 4:33 PM  To reach On-call, see care teams to locate the attending and reach out to them via [email or web address]. If 7PM-7AM, please contact night-coverage If you still have difficulty reaching the attending provider, please page the [facility] (Director on Call) for [facility] on amion for assistance.

## 2021-12-17 DIAGNOSIS — I5032 Chronic diastolic (congestive) heart failure: Secondary | ICD-10-CM | POA: Diagnosis not present

## 2021-12-17 DIAGNOSIS — S32010A Wedge compression fracture of first lumbar vertebra, initial encounter for closed fracture: Secondary | ICD-10-CM | POA: Diagnosis not present

## 2021-12-17 DIAGNOSIS — I2510 Atherosclerotic heart disease of native coronary artery without angina pectoris: Secondary | ICD-10-CM | POA: Diagnosis not present

## 2021-12-17 DIAGNOSIS — W19XXXA Unspecified fall, initial encounter: Secondary | ICD-10-CM | POA: Diagnosis not present

## 2021-12-17 LAB — BASIC METABOLIC PANEL
Anion gap: 7 (ref 5–15)
BUN: 14 mg/dL (ref 8–23)
CO2: 28 mmol/L (ref 22–32)
Calcium: 8.4 mg/dL — ABNORMAL LOW (ref 8.9–10.3)
Chloride: 97 mmol/L — ABNORMAL LOW (ref 98–111)
Creatinine, Ser: 0.82 mg/dL (ref 0.44–1.00)
GFR, Estimated: >60 mL/min (ref >60)
Glucose, Bld: 99 mg/dL (ref 70–99)
Potassium: 4.5 mmol/L (ref 3.5–5.1)
Sodium: 132 mmol/L — ABNORMAL LOW (ref 135–145)

## 2021-12-17 LAB — CBC
HCT: 29.6 % — ABNORMAL LOW (ref 36.0–46.0)
Hemoglobin: 9.5 g/dL — ABNORMAL LOW (ref 12.0–15.0)
MCH: 27.1 pg (ref 26.0–34.0)
MCHC: 32.1 g/dL (ref 30.0–36.0)
MCV: 84.3 fL (ref 80.0–100.0)
Platelets: 523 10*3/uL — ABNORMAL HIGH (ref 150–400)
RBC: 3.51 MIL/uL — ABNORMAL LOW (ref 3.87–5.11)
RDW: 16.6 % — ABNORMAL HIGH (ref 11.5–15.5)
WBC: 9.9 10*3/uL (ref 4.0–10.5)
nRBC: 0 % (ref 0.0–0.2)

## 2021-12-17 LAB — MAGNESIUM: Magnesium: 1.9 mg/dL (ref 1.7–2.4)

## 2021-12-17 LAB — PHOSPHORUS: Phosphorus: 5.6 mg/dL — ABNORMAL HIGH (ref 2.5–4.6)

## 2021-12-17 MED ORDER — CLOPIDOGREL BISULFATE 75 MG PO TABS
75.0000 mg | ORAL_TABLET | Freq: Every day | ORAL | Status: DC
  Administered 2021-12-18 – 2021-12-19 (×2): 75 mg via ORAL
  Filled 2021-12-17 (×2): qty 1

## 2021-12-17 NOTE — Evaluation (Signed)
Occupational Therapy Evaluation Patient Details Name: Erin [name] MRN: [id number] DOB: 1946-09-19 Today's Date: 12/17/2021   History of Present Illness Pt is admitted for L1 compression fx and is s/p kyphoplasty on 12/16/21. Extensive history includes HTN, HLD, GERD, depression, anxiety, RA, and pulm fibrosis. Cleared to mobilize by surgeon via secure chat.   Clinical Impression   Erin [name] was seen for OT evaluation this date, POD#1 from above procedure. Prior to hospital admission, pt was modified independent with mobility, ADL, and has a PCA who assists with IADL management. Pt reports she has been having increased difficulty with all aspects of mobility and ADL due to back pain and has had multiple falls (5+) in the last 6 months. Pt lives alone in a 1 level home with a ramped entrance. She endorses she would have PRN assist from her family and church upon DC home. Pt requires MOD A for STS and functional mobility. +2 MOD A for bed mobility. Anticipate MAX A for LB ADL management from STS. Pt educated in back precautions with handout provided, self care skills, bed mobility and functional transfer training, AE/DME for bathing, dressing, and toileting needs, and home/routines modifications and falls prevention strategies to maximize safety and functional independence while minimizing falls risk and maintaining precautions. Pt return verbalizes understanding but continues to require cueing when implementing back precautions into functional tasks. Handout provided. She will continue to benefit from skilled OT services to maximize safety and functional independence and support recall and carry over of learned precautions/techniques for bed mobility, functional transfers, and self care skills. Recommend STR upon hospital DC.     Recommendations for follow up therapy are one component of a multi-disciplinary discharge planning process, led by the attending physician.  Recommendations may be updated based  on patient status, additional functional criteria and insurance authorization.   Follow Up Recommendations  Skilled nursing-short term rehab (<3 hours/day)    Assistance Recommended at Discharge Frequent or constant Supervision/Assistance  Patient can return home with the following A lot of help with walking and/or transfers;A lot of help with bathing/dressing/bathroom;Assistance with cooking/housework;Assist for transportation;Help with stairs or ramp for entrance    Functional Status Assessment  Patient has had a recent decline in their functional status and demonstrates the ability to make significant improvements in function in a reasonable and predictable amount of time.  Equipment Recommendations  BSC/3in1    Recommendations for Other Services       Precautions / Restrictions Precautions Precautions: Fall;Back Precaution Booklet Issued: Yes (comment) Required Braces or Orthoses: Spinal Brace Spinal Brace: Lumbar corset;Applied in sitting position Restrictions Weight Bearing Restrictions: No      Mobility Bed Mobility Overal bed mobility: Needs Assistance Bed Mobility: Sit to Supine       Sit to supine: Min assist;Mod assist   General bed mobility comments: cues for sequencing and min/mod assist for B LE advancement and sliding up to HOB (+2 required). Pt follows commands well    Transfers Overall transfer level: Needs assistance Equipment used: Rolling walker (2 wheels) Transfers: Bed to chair/wheelchair/BSC   Stand pivot transfers: Min assist;Mod assist   Step pivot transfers: Min assist     General transfer comment: needs cues for upright posture as she tends to keep B knees flexed. Very small steps towards bed and cautious. All  mobility performed with LSO donned      Balance Overall balance assessment: Needs assistance;History of Falls Sitting-balance support: Feet supported;Bilateral upper extremity supported Sitting balance-Leahy Scale: [place] Sitting  balance - Comments: Fatigues qucily when seated without back support.   Standing balance support: Reliant on assistive device for balance;Bilateral upper extremity supported;During functional activity Standing balance-Leahy Scale: Poor                             ADL either performed or assessed with clinical judgement   ADL Overall ADL's : Needs assistance/impaired                                       General ADL Comments: Pt is functionally limited by generalized weakness, increased pain with functional mobility, and decreased activity tolerance. She requires +1 MOD A for STS attempts and brief SPT from recliner to bed. MAX A for LB ADL management with increased cueing and education on safe use of AE/DME for adherence to back precautions.     Vision Baseline Vision/History: 1 Wears glasses Patient Visual Report: No change from baseline       Perception     Praxis      Pertinent Vitals/Pain Pain Assessment: 0-10 Pain Score: 2  Pain Location: 2 at rest, low back. 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 Notable changes to B hands with generalized weakness t/o.)   Lower Extremity Assessment Lower Extremity Assessment: Generalized weakness   Cervical / Trunk Assessment Cervical / Trunk Assessment: Kyphotic   Communication Communication Communication: No difficulties   Cognition Arousal/Alertness: Awake/alert Behavior During Therapy: WFL for tasks assessed/performed Overall Cognitive Status: Within Functional Limits for tasks assessed                                       General Comments       Exercises Exercises: Other  exercises Other Exercises Other Exercises: supine ther-ex including B LE including SLR, heel slides, and hip abd/add. 10 reps performed with supervision. Education given regarding frequency and duration of ther-ex. Other Exercises: OT facilitates education on safe use of AE/DME for ADL management, Back precautions (handout provided), and compensatory strategies for ADL management in consideration of back precautions. Extended time taken to discuss DC options with pt.   Shoulder Instructions      Home Living Family/patient expects to be discharged to:: Private residence Living Arrangements: Alone Available Help at Discharge: Family;Available PRN/intermittently (Cousin and son that live in the area.) Type of Home: House Home Access: Ramped entrance     Home Layout: One level     Bathroom Shower/Tub: Teacher, early years/pre: Standard Bathroom Accessibility: No   Home Equipment: Conservation officer, nature (2 wheels);Cane - quad;Grab bars - tub/shower          Prior Functioning/Environment Prior Level of Function : History of Falls (last six months);Independent/Modified Independent             Mobility Comments: primarily uses RW for household mobility, however, reports accessiblity in the home is limited. Multiple falls in the last 6 months. ADLs Comments: Has a PCA that comes 1x every 2 weeks, to shop, clean, etc. Generally able to complete ADLs independently but endorses multiple falls with ADL management.  OT Problem List: Decreased strength;Decreased coordination;Pain;Decreased range of motion;Decreased activity tolerance;Decreased safety awareness;Impaired balance (sitting and/or standing);Decreased knowledge of use of DME or AE;Impaired UE functional use;Decreased knowledge of precautions      OT Treatment/Interventions: Self-care/ADL training;Therapeutic exercise;Therapeutic activities;DME and/or AE instruction;Patient/family education;Balance training;Energy  conservation    OT Goals(Current goals can be found in the care plan section) Acute Rehab OT Goals Patient Stated Goal: To get stronger. OT Goal Formulation: With patient Time For Goal Achievement: 12/31/21 Potential to Achieve Goals: Good ADL Goals Pt Will Perform Lower Body Dressing: with min assist;sit to/from stand (with independent implementation of back precautions x3.) Pt Will Transfer to Toilet: with supervision;with set-up;ambulating;bedside commode (with independent implementation of back precautions x3.) Pt Will Perform Toileting - Clothing Manipulation and hygiene: sitting/lateral leans;with supervision;with set-up (with independent implementation of back precautions x3.)  OT Frequency: Min 2X/week    Co-evaluation   Reason for Co-Treatment: Complexity of the patient's impairments (multi-system involvement);To address functional/ADL transfers;For patient/therapist safety PT goals addressed during session: Mobility/safety with mobility OT goals addressed during session: ADL's and self-care;Proper use of Adaptive equipment and DME      AM-PAC OT "6 Clicks" Daily Activity     Outcome Measure Help from another person eating meals?: None Help from another person taking care of personal grooming?: A Little Help from another person toileting, which includes using toliet, bedpan, or urinal?: A Lot Help from another person bathing (including washing, rinsing, drying)?: A Lot Help from another person to put on and taking off regular upper body clothing?: A Little Help from another person to put on and taking off regular lower body clothing?: A Lot 6 Click Score: 16   End of Session Equipment Utilized During Treatment: Gait belt;Rolling walker (2 wheels)  Activity Tolerance: Patient tolerated treatment well Patient left: in bed;with call bell/phone within reach (With PT in room to finish up session.)  OT Visit Diagnosis: Other abnormalities of gait and mobility (R26.89);Pain Pain  - part of body:  (Back)                Time: 0086-7619 OT Time Calculation (min): 52 min Charges:  OT General Charges $OT Visit: 1 Visit OT Evaluation $OT Eval Moderate Complexity: 1 Mod OT Treatments $Self Care/Home Management : 23-37 mins  Shara Blazing, M.S., OTR/L [facility] - [facility] Ascom: [phone] 12/17/21, 4:22 PM

## 2021-12-17 NOTE — Progress Notes (Signed)
Referring Physician(s): Dr. Rudene Christians  Supervising Physician: Michaelle Birks  Patient Status:  [facility] - In-pt  Reason for visit: S/p fall with symptomatic lumbar level 1 compression fracture s/p successful L1 vertebral body augmentation with kyphoplasty 1/9 in IR with Dr. Maryelizabeth Kaufmann  Subjective: Patient states she is doing well overall just waiting on PT. She states her mid to lower back pain has improved since the procedure and currently rates her back pain 2/10. She denies any new complaints.  Allergies: Remicade [infliximab], Sulfa antibiotics, Actonel [risedronate sodium], Fosamax [alendronate sodium], Procardia [nifedipine], Elemental sulfur, and Wellbutrin [bupropion]  Medications: Prior to Admission medications   Medication Sig Start Date End Date Taking? Authorizing Provider  ALPRAZolam Duanne Moron) 0.5 MG tablet Take 1 tablet (0.5 mg total) by mouth 4 (four) times daily as needed for anxiety. 06/02/19  Yes Bonnielee Haff, MD  amLODipine (NORVASC) 5 MG tablet Take 1 tablet (5 mg total) by mouth daily. 09/23/21  Yes Minna Merritts, MD  aspirin 81 MG tablet Take 81 mg by mouth daily.    Yes [provider]  atorvastatin (LIPITOR) 40 MG tablet Take 1 tablet (40 mg total) by mouth daily. 09/23/21  Yes Minna Merritts, MD  clopidogrel (PLAVIX) 75 MG tablet Take 1 tablet (75 mg total) by mouth daily. 09/23/21  Yes Gollan, Kathlene November, MD  DULoxetine (CYMBALTA) 60 MG capsule Take 60 mg by mouth daily. In the morning. 08/05/18  Yes [provider]  ezetimibe (ZETIA) 10 MG tablet Take 1 tablet (10 mg total) by mouth daily. 09/23/21  Yes Gollan, Kathlene November, MD  furosemide (LASIX) 20 MG tablet Take 1 tablet (20 mg total) by mouth every other day. May take extra 20 mg for swelling or shortness of breath 09/23/21  Yes Gollan, Kathlene November, MD  gabapentin (NEURONTIN) 600 MG tablet Take 1 tablet by mouth 3 (three) times daily. 04/05/21  Yes [provider]  HYDROcodone-acetaminophen  (NORCO/VICODIN) 5-325 MG tablet Take 1 tablet by mouth every 4 (four) hours as needed. 09/17/21  Yes [provider]  hydroxychloroquine (PLAQUENIL) 200 MG tablet Take 1 tablet (200 mg total) by mouth 2 (two) times daily. 09/23/21  Yes Minna Merritts, MD  isosorbide mononitrate (IMDUR) 30 MG 24 hr tablet Take 0.5 tablets (15 mg total) by mouth daily. 09/23/21  Yes Gollan, Kathlene November, MD  metoprolol tartrate (LOPRESSOR) 25 MG tablet Take 0.5 tablets (12.5 mg total) by mouth 2 (two) times daily. 09/23/21  Yes Gollan, Kathlene November, MD  Multiple Vitamin (MULTIVITAMIN WITH MINERALS) TABS tablet Take 1 tablet by mouth daily.   Yes [provider]  Nintedanib (OFEV) 150 MG CAPS Take 1 capsule (150 mg total) by mouth daily. 05/22/20  Yes Martyn Ehrich, NP  albuterol (VENTOLIN HFA) 108 (90 Base) MCG/ACT inhaler Inhale 2 puffs into the lungs every 4 (four) hours as needed for wheezing or shortness of breath. 06/02/19   Bonnielee Haff, MD  clobetasol ointment (TEMOVATE) 0.05 % Apply nightly to the affected area for 4 to 6 weeks and if your symptoms are improving then decrease the treatment to every other night for four weeks, then twice weekly for 4 weeks. If symptoms resolve then one to three times per week for maintenance. Patient not taking: Reported on 12/10/2021 05/07/21   Gillis Ends, MD  dexlansoprazole [facility]) 60 MG capsule Take 1 capsule (60 mg total) by mouth daily. Patient not taking: Reported on 12/10/2021 03/03/18   Hillary Bow, MD  latanoprost Ivin Poot)  0.005 % ophthalmic solution Place 1 drop into both eyes at bedtime.     [provider]  nitroGLYCERIN (NITROSTAT) 0.4 MG SL tablet DISSOLVE 1 TABLET UNDER THE TONGUE EVERY5 MINUTES AS NEEDED FOR CHEST PAIN 04/15/21   Minna Merritts, MD  polyethylene glycol (MIRALAX / GLYCOLAX) packet Take 17 g by mouth daily as needed (constipation.).     [provider]  predniSONE (DELTASONE) 5 MG tablet Take 1  tablet (5 mg total) by mouth 2 (two) times daily with a meal. RESUME AFTER COMPLETING [place] Patient not taking: Reported on 12/10/2021 06/11/19   Bonnielee Haff, MD  zolpidem (AMBIEN) 10 MG tablet Take 1 tablet (10 mg total) by mouth at bedtime as needed for sleep. 06/02/19   Bonnielee Haff, MD   Vital Signs: BP 103/62 (BP Location: Right Arm)    Pulse 74    Temp (!) 97.5 F (36.4 C)    Resp 18    Ht 5' (1.524 m)    Wt 147 lb 11.3 oz (67 kg)    LMP  (LMP Unknown)    SpO2 96%    BMI 28.85 kg/m   Physical Exam General: A&O, NAD, sitting up in chair Back: Dressing C/D/I-NT, no signs of bleeding, hematoma or infection  Imaging: IR KYPHO LUMBAR INC FX REDUCE BONE BX UNI/BIL CANNULATION INC/IMAGING  Result Date: 12/16/2021 CLINICAL DATA:  History of fall. Symptomatic L1 compression fracture deformity. Failed conservative medical management EXAM: FLUOROSCOPIC GUIDED KYPHOPLASTY OF L1 VERTEBRAL BODY COMPARISON:  MR L-spine and lumbar XRs, 12/09/2021. MEDICATIONS: As antibiotic prophylaxis, Ancef 2 g IV was ordered pre-procedure and administered intravenously within 1 hour of incision. ANESTHESIA/SEDATION: Moderate (conscious) sedation was employed during this procedure. A total of Versed 4 mg and Fentanyl 200 mcg was administered intravenously. Moderate Sedation Time: 72 minutes. The patient's level of consciousness and vital signs were monitored continuously by radiology nursing throughout the procedure under my direct supervision. FLUOROSCOPY TIME:  12 minutes, 36 seconds (338.3 mGy) COMPLICATIONS: None immediate. TECHNIQUE: The procedure, risks (including but not limited to bleeding, infection, organ damage), benefits, and alternatives were explained to the patient. Questions regarding the procedure were encouraged and answered. The patient understands and consents to the procedure. The patient was placed prone on the fluoroscopic table. The skin overlying the upper thoracic  region was then prepped and draped in the usual sterile fashion. Maximal barrier sterile technique was utilized including caps, mask, sterile gowns, sterile gloves, sterile drape, hand hygiene and skin antiseptic. Intravenous Fentanyl and Versed were administered as conscious sedation during continuous cardiorespiratory monitoring by the radiology RN. The LEFT pedicle at L1 vertebral body was then infiltrated with 1% lidocaine followed by the advancement of a Kyphon trocar needle transpedicular at the LEFT pedicle into the posterior one-third of the vertebral body. Subsequently, the osteo drill was advanced to the anterior third of the vertebral body. The osteo drill was retracted. The maneuvers were repeated for trocar needle insertion into the RIGHT pedicle at L1 vertebral body. Through the working cannulas, Kyphon inflatable bone tamps 15 x 3 was advanced and positioned with the distal marker approximately 5 mm from the anterior aspect of the cortex. Appropriate positioning was confirmed on the AP projection. At this time, the balloons were expanded using contrast via a Kyphon inflation syringe device via micro tubing. Inflations was continued until there was near apposition with the superior end plate. At this time, methylmethacrylate (PMMA) mixture was reconstituted in the Kyphon bone  mixing device system. This was then loaded into the delivery mechanism, attached to Kyphon bone filler. The balloons were deflated and removed followed by the instillation of methylmethacrylate mixture with excellent filling in the AP and lateral projections. Approximately 8 mL PMMA was administered. No extravasation was noted in the disk spaces or posteriorly into the spinal canal. No epidural venous contamination was seen. The working cannula and the bone filler was then retrieved and removed. Hemostasis was achieved with manual compression. The patient tolerated the procedure well without immediate postprocedural complication.  IMPRESSION: Successful L1 vertebral body augmentation using balloon kyphoplasty, using a bi-pedicular approach, as above. Michaelle Birks, MD [facility] Electronically Signed   By: Michaelle Birks M.D.   On: 12/16/2021 17:51    Labs:  CBC: Recent Labs    12/14/21 0704 12/15/21 0500 12/16/21 0447 12/17/21 0417  WBC 7.6 10.3 9.9 9.9  HGB 9.7* 8.8* 9.0* 9.5*  HCT 30.5* 28.2* 29.1* 29.6*  PLT 466* 481* 468* 523*    COAGS: Recent Labs    12/09/21 1033  INR 1.2  APTT 32    BMP: Recent Labs    01/18/21 1116 01/25/21 1457 12/14/21 0704 12/15/21 0500 12/16/21 0447 12/17/21 0417  NA 129*   < > 137 134* 133* 132*  K 4.6   < > 4.2 4.1 4.0 4.5  CL 95*   < > 101 99 98 97*  CO2 19*   < > 29 27 29 28   GLUCOSE 82   < > 83 93 96 99  BUN 11   < > 12 16 15 14   CALCIUM 8.7   < > 8.4* 8.3* 8.4* 8.4*  CREATININE 0.67   < > 0.79 0.76 0.67 0.82  GFRNONAA 86   < > >60 >60 >60 >60  GFRAA 99  --   --   --   --   --    < > = values in this interval not displayed.    LIVER FUNCTION TESTS: Recent Labs    01/18/21 1116 12/09/21 0512  BILITOT 0.3 0.7  AST 24 23  ALT 19 18  ALKPHOS 88 91  PROT 5.9* 5.9*  ALBUMIN 3.5* 2.7*    Assessment and Plan: This is a 76 year old female with PMHx significant for rheumatoid arthritis, scleroderma, osteopenia with prior vertebral augmentation at thoracic 7, 8, 9 in 2020 by Dr. Rudene Christians who is admitted currently secondary to low back pain after recent falls. Patient with symptomatic lumbar level 1 compression fracture s/p successful L1 vertebral body augmentation with kyphoplasty 1/9 in IR with Dr. Maryelizabeth Kaufmann. Clinically patient is improving with decreased pain rated 2/10 today, procedure site without complication can remove dressing tomorrow and resume Plavix.   Please call IR with any questions or concerns.    Electronically Signed: Hedy Jacob, PA-C 12/17/2021, 2:36 PM   I spent a total of 15  Minutes at the the patient's bedside AND on the patient's hospital floor or unit, greater than 50% of which was counseling/coordinating care for symptomatic L1 compression fracture.

## 2021-12-17 NOTE — Progress Notes (Addendum)
[facility] Progress Note  Patient: Erin [name]    AGT:[id number]  DOA: 12/09/2021     Date of Service: the patient was seen and examined on 12/17/2021  Chief Complaint  Patient presents with   Fall   Brief hospital course: Erin [name] is a 76 y.o. 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 In the ED, MRI of the lumbar spine showed L1 compression fracture with 30% of height loss.  Orthopedics consulted.  Patient is s/p L1 vertebral body augmentation with kyphoplasty on 1/9 by IR.  PT/OT recommending SNF placement.   Subjective:  Patient denies any new complaints, report back pain has significantly improved following kyphoplasty.    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)   Hyperlipidemia   HTN, goal below 140/90   Rheumatoid arthritis (HCC)   [name] S/P percutaneous coronary angioplasty   Chronic diastolic CHF (congestive heart failure) (HCC)   Depression with anxiety   Normocytic anemia   Fall at home, initial encounter     Closed compression fracture of body of L1 vertebra s/p L1 vertebral body augmentation with kyphoplasty on 1/9 by IR Pain management PT/OT eval after kyphoplasty, patient will need SNF placement IR okay to restart Plavix on 12/18/2021  Frequent falls, unknown cause vitamin B12 level, vitamin D level,  and folate levl WNL  Iron deficiency, iron level 25, saturation 10%, started oral iron supplement with vitamin C.  Idiopathic interstitial fibrosis of lung syndrome  Bronchodilators Continue home OFEV   Hyperlipidemia Zetia, Lipitor   HTN IV hydralazine as needed Continue  home amlodipine, metoprolol   Rheumatoid arthritis: pt states that she is taking prednisone 5 mg every other day now Continue Plaquenil Continue prednisone, increase to 5 mg daily   [name] S/P percutaneous coronary angioplasty Continue Lipitor, Zetia, aspirin, Plavix, Imdur   Chronic diastolic CHF (congestive heart failure) 2D echo on 05/30/2021 showed EF > 55%. 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 Removed Foley catheter, and attempt voiding trial while inpatient   Vaginal discharge possible yeast infection, Diflucan 150 mg x 1 dose given on 1/7       Diet: Regular diet DVT Prophylaxis: Subcutaneous Lovenox   Advance goals of care discussion: DNR  Family Communication:  None at bedside  Disposition:  SNF placement     Physical Exam: General: NAD  Cardiovascular: S1, S2 present Respiratory: CTAB Abdomen: Soft, nontender, nondistended, bowel sounds present Musculoskeletal: No bilateral pedal edema noted Skin: Normal Psychiatry: Normal mood   Vitals:   12/17/21 0955 12/17/21 1431 12/17/21 1444 12/17/21 1732  BP: (!) 121/49 103/62 (!) 107/47 (!) 105/56  Pulse: 90 74 96 82  Resp: 18 18 18 20   Temp: (!) 97.4 F (36.3 C) (!) 97.5 F (36.4 C) (!) 97.4 F (36.3 C) 97.8 F (36.6 C)  TempSrc:  SpO2: 93% 96% 94% 94%  Weight:      Height:        Intake/Output Summary (Last 24 hours) at 12/17/2021 1814 Last data filed at 12/17/2021 1700 Gross per 24 hour  Intake --  Output 1250 ml  Net -1250 ml   Filed Weights   12/09/21 0952 12/17/21 0530   Weight: 70.8 kg 67 kg    Data Reviewed: I have personally reviewed and interpreted daily labs, tele strips, imagings as discussed above. I reviewed all nursing notes, pharmacy notes, vitals, pertinent old records I have discussed plan of care as described above with RN and patient/family.  CBC: Recent Labs  Lab 12/13/21 0607 12/14/21 0704 12/15/21 0500 12/16/21 0447 12/17/21 0417  WBC 8.2 7.6 10.3 9.9 9.9  HGB 9.0* 9.7* 8.8* 9.0* 9.5*  HCT 28.1* 30.5* 28.2* 29.1* 29.6*  MCV 84.1 85.2 85.5 85.6 84.3  PLT 437* 466* 481* 468* 578*   Basic Metabolic Panel: Recent Labs  Lab 12/11/21 0542 12/12/21 0518 12/13/21 0607 12/14/21 0704 12/15/21 0500 12/16/21 0447 12/17/21 0417  NA 132*   < > 134* 137 134* 133* 132*  K 4.2   < > 4.2 4.2 4.1 4.0 4.5  CL 100   < > 100 101 99 98 97*  CO2 25   < > 27 29 27 29 28   GLUCOSE 119*   < > 88 83 93 96 99  BUN 14   < > 11 12 16 15 14   CREATININE 0.64   < > 0.77 0.79 0.76 0.67 0.82  CALCIUM 8.4*   < > 8.4* 8.4* 8.3* 8.4* 8.4*  MG 2.0  --   --   --   --  1.9 1.9  PHOS 4.3  --   --   --   --  4.6 5.6*   < > = values in this interval not displayed.    Studies: No results found.  Scheduled Meds:  amLODipine  5 mg Oral Daily   vitamin C  500 mg Oral Daily   aspirin  81 mg Oral Daily   atorvastatin  40 mg Oral Daily   calcitonin (salmon)  1 spray Alternating Nares Daily   Chlorhexidine Gluconate Cloth  6 each Topical Daily   DULoxetine  60 mg Oral Daily   enoxaparin (LOVENOX) injection  0.5 mg/kg Subcutaneous Q24H   ezetimibe  10 mg Oral Daily   furosemide  20 mg Oral QODAY   gabapentin  600 mg Oral BID   hydroxychloroquine  200 mg Oral Daily   And   hydroxychloroquine  200 mg Oral QODAY   iron polysaccharides  150 mg Oral Daily   isosorbide mononitrate  15 mg Oral Daily   latanoprost  1 drop Both Eyes QHS   lidocaine  1 patch Transdermal Q24H   metoprolol tartrate  12.5 mg Oral BID   multivitamin with minerals  1 tablet Oral  Daily   pantoprazole  40 mg Oral Daily   polyethylene glycol  17 g Oral Daily   predniSONE  5 mg Oral Q breakfast   Continuous Infusions:   PRN Meds: acetaminophen, albuterol, ALPRAZolam, bisacodyl, dextromethorphan-guaiFENesin, hydrALAZINE, methocarbamol, morphine injection, nitroGLYCERIN, ondansetron (ZOFRAN) IV, oxyCODONE-acetaminophen, zolpidem    Author: Alma Friendly, MD [facility] 12/17/2021 6:14 PM  To reach On-call, see care teams to locate the attending and reach out to them via [email or web address]. If 7PM-7AM, please contact night-coverage

## 2021-12-17 NOTE — Evaluation (Signed)
Physical Therapy Evaluation Patient Details Name: Erin [name] MRN: [id number] DOB: 1946-09-28 Today's Date: 12/17/2021  History of Present Illness  Pt is admitted for L1 compression fx and is s/p kyphoplasty on 12/16/21. Extensive history includes HTN, HLD, GERD, depression, anxiety, RA, and pulm fibrosis. Cleared to mobilize by surgeon via secure chat.  Clinical Impression  Pt is a pleasant 76 year old female who was admitted for L1 compression fracture and is s/p kyphoplasty. Pt performs bed mobility with min/mod assist and transfers/ambulation with min assist and RW. +2 for safety. All mobility performed using LSO. Pt demonstrates deficits with strength/mobility/precautions. Pt is fearful of movement and needs 24/7 assist for successful home discharge. At this time, she isn't safe to be home alone. Additional 8 minutes performed of there-ex outside of co-eval with OT. Would benefit from skilled PT to address above deficits and promote optimal return to PLOF; recommend transition to STR upon discharge from acute hospitalization.       Recommendations for follow up therapy are one component of a multi-disciplinary discharge planning process, led by the attending physician.  Recommendations may be updated based on patient status, additional functional criteria and insurance authorization.  Follow Up Recommendations Skilled nursing-short term rehab (<3 hours/day)    Assistance Recommended at Discharge Frequent or constant Supervision/Assistance  Patient can return home with the following  A lot of help with walking and/or transfers;A lot of help with bathing/dressing/bathroom;Help with stairs or ramp for entrance    Equipment Recommendations  (TBD)  Recommendations for Other Services       Functional Status Assessment Patient has had a recent decline in their functional status and demonstrates the ability to make significant improvements in function in a reasonable and predictable amount of  time.     Precautions / Restrictions Precautions Precautions: Fall;Back Precaution Booklet Issued: No Required Braces or Orthoses: Spinal Brace Spinal Brace: Lumbar corset;Applied in sitting position Restrictions Weight Bearing Restrictions: No      Mobility  Bed Mobility Overal bed mobility: Needs Assistance Bed Mobility: Sit to Supine       Sit to supine: Min assist;Mod assist   General bed mobility comments: cues for sequencing and min/mod assist for B LE advancement and sliding up to HOB (+2 required). Pt follows commands well    Transfers Overall transfer level: Needs assistance Equipment used: Rolling walker (2 wheels) Transfers: Bed to chair/wheelchair/BSC     Step pivot transfers: Min assist       General transfer comment: needs cues for upright posture as she tends to keep B knees flexed. Very small steps towards bed and cautious. All  mobility performed with LSO donned    Ambulation/Gait               General Gait Details: able to take steps to recliner using RW  Stairs            Wheelchair Mobility    Modified Rankin (Stroke Patients Only)       Balance Overall balance assessment: Needs assistance;History of Falls Sitting-balance support: Feet supported;Bilateral upper extremity supported Sitting balance-Leahy Scale: Fair     Standing balance support: Reliant on assistive device for balance Standing balance-Leahy Scale: Poor                               Pertinent Vitals/Pain Pain Assessment: 0-10 Pain Score: 2  Pain Location: 2 at rest, low back. Pain Descriptors / Indicators:  Aching Pain Intervention(s): Limited activity within patient's tolerance;Repositioned    Home Living Family/patient expects to be discharged to:: Private residence Living Arrangements: Alone   Type of Home: House Home Access: Ramped entrance       Home Layout: One level Home Equipment: Conservation officer, nature (2 wheels);Cane - quad;Grab  bars - tub/shower      Prior Function Prior Level of Function : History of Falls (last six months)             Mobility Comments: Has primarily uses RW for household mobility, however, reports accessiblity in the home is limited. Multiple falls in the last 6 months. ADLs Comments: Has a PCA that comes 1x every 2 weeks, to shop, clean, etc. Generally able to complete ADLs independently but endorses multiple falls with ADL management.     Hand Dominance   Dominant Hand: Right    Extremity/Trunk Assessment   Upper Extremity Assessment Upper Extremity Assessment: Defer to OT evaluation    Lower Extremity Assessment Lower Extremity Assessment: Generalized weakness (B LE grossly 3/5. 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 10 reps performed with supervision. Education given regarding frequency and duration of ther-ex.   Assessment/Plan    PT Assessment Patient needs continued PT services  PT Problem List Decreased strength;Decreased balance;Decreased mobility;Pain;Decreased knowledge of precautions       PT Treatment Interventions DME instruction;Gait training;Therapeutic exercise;Balance training    PT Goals (Current goals can be found in the Care Plan section)  Acute Rehab PT Goals Patient Stated Goal: to go home PT Goal Formulation: With patient Time For Goal Achievement: 12/31/21 Potential to Achieve Goals: Good    Frequency 7X/week     Co-evaluation PT/OT/SLP Co-Evaluation/Treatment: Yes Reason for Co-Treatment: Complexity of the  patient's impairments (multi-system involvement);To address functional/ADL transfers;For patient/therapist safety PT goals addressed during session: Mobility/safety with mobility OT goals addressed during session: ADL's and self-care       AM-PAC PT "6 Clicks" Mobility  Outcome Measure Help needed turning from your back to your side while in a flat bed without using bedrails?: A Little Help needed moving from lying on your back to sitting on the side of a flat bed without using bedrails?: A Lot Help needed moving to and from a bed to a chair (including a wheelchair)?: A Little Help needed standing up from a chair using your arms (e.g., wheelchair or bedside chair)?: A Little Help needed to walk in hospital room?: A Lot Help needed climbing 3-5 steps with a railing? : A Lot 6 Click Score: 15    End of Session Equipment Utilized During Treatment: Back brace;Gait belt Activity Tolerance: Patient tolerated treatment well Patient left: in bed;with bed alarm set Nurse Communication: Mobility status PT Visit Diagnosis: Unsteadiness on feet (R26.81);Muscle weakness (generalized) (M62.81);Difficulty in walking, not elsewhere classified (R26.2);Pain Pain - Right/Left:  (bilat) Pain - part of body:  (back)    Time: 1500-1520 PT Time Calculation (min) (  ACUTE ONLY): 20 min   Charges:   PT Evaluation $PT Eval Low Complexity: 1 Low PT Treatments $Therapeutic Exercise: 8-22 mins        Greggory Stallion, PT, DPT, GCS [phone]   Gared Gillie 12/17/2021, 3:58 PM

## 2021-12-18 DIAGNOSIS — I5032 Chronic diastolic (congestive) heart failure: Secondary | ICD-10-CM | POA: Diagnosis not present

## 2021-12-18 DIAGNOSIS — W19XXXA Unspecified fall, initial encounter: Secondary | ICD-10-CM | POA: Diagnosis not present

## 2021-12-18 DIAGNOSIS — I2510 Atherosclerotic heart disease of native coronary artery without angina pectoris: Secondary | ICD-10-CM | POA: Diagnosis not present

## 2021-12-18 DIAGNOSIS — S32010A Wedge compression fracture of first lumbar vertebra, initial encounter for closed fracture: Secondary | ICD-10-CM | POA: Diagnosis not present

## 2021-12-18 LAB — PHOSPHORUS: Phosphorus: 5.1 mg/dL — ABNORMAL HIGH (ref 2.5–4.6)

## 2021-12-18 LAB — CBC
HCT: 26.6 % — ABNORMAL LOW (ref 36.0–46.0)
Hemoglobin: 8.6 g/dL — ABNORMAL LOW (ref 12.0–15.0)
MCH: 27.1 pg (ref 26.0–34.0)
MCHC: 32.3 g/dL (ref 30.0–36.0)
MCV: 83.9 fL (ref 80.0–100.0)
Platelets: 469 10*3/uL — ABNORMAL HIGH (ref 150–400)
RBC: 3.17 MIL/uL — ABNORMAL LOW (ref 3.87–5.11)
RDW: 16.3 % — ABNORMAL HIGH (ref 11.5–15.5)
WBC: 7.7 10*3/uL (ref 4.0–10.5)
nRBC: 0 % (ref 0.0–0.2)

## 2021-12-18 LAB — BASIC METABOLIC PANEL
Anion gap: 7 (ref 5–15)
BUN: 17 mg/dL (ref 8–23)
CO2: 28 mmol/L (ref 22–32)
Calcium: 8.1 mg/dL — ABNORMAL LOW (ref 8.9–10.3)
Chloride: 95 mmol/L — ABNORMAL LOW (ref 98–111)
Creatinine, Ser: 0.91 mg/dL (ref 0.44–1.00)
GFR, Estimated: >60 mL/min (ref >60)
Glucose, Bld: 100 mg/dL — ABNORMAL HIGH (ref 70–99)
Potassium: 4 mmol/L (ref 3.5–5.1)
Sodium: 130 mmol/L — ABNORMAL LOW (ref 135–145)

## 2021-12-18 LAB — MAGNESIUM: Magnesium: 2.1 mg/dL (ref 1.7–2.4)

## 2021-12-18 NOTE — Progress Notes (Signed)
[facility] Progress Note  Patient: Erin [name]    IWL:[id number]  DOA: 12/09/2021     Date of Service: the patient was seen and examined on 12/18/2021  Chief Complaint  Patient presents with   Fall   Brief hospital course: Erin [name] is a 76 y.o. 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 In the ED, MRI of the lumbar spine showed L1 compression fracture with 30% of height loss.  Orthopedics consulted.  Patient is s/p L1 vertebral body augmentation with kyphoplasty on 1/9 by IR.  PT/OT recommending SNF placement.   Subjective:  Patient denies any new complaints.    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)   Hyperlipidemia   HTN, goal below 140/90   Rheumatoid arthritis (HCC)   [name] S/P percutaneous coronary angioplasty   Chronic diastolic CHF (congestive heart failure) (HCC)   Depression with anxiety   Normocytic anemia   Fall at home, initial encounter     Closed compression fracture of body of L1 vertebra s/p L1 vertebral body augmentation with kyphoplasty on 1/9 by IR Pain management PT/OT eval after kyphoplasty, patient will need SNF placement IR okay to restart Plavix on 12/18/2021  Frequent falls, unknown cause vitamin B12 level, vitamin D level,  and folate levl WNL  Iron deficiency, iron level 25, saturation 10%, started oral iron supplement with vitamin C.  Idiopathic interstitial fibrosis of lung syndrome  Bronchodilators Continue home OFEV   Hyperlipidemia Zetia, Lipitor   HTN IV hydralazine as needed Continue home amlodipine, metoprolol   Rheumatoid arthritis: pt states  that she is taking prednisone 5 mg every other day now Continue Plaquenil Continue prednisone, increase to 5 mg daily   [name] S/P percutaneous coronary angioplasty Continue Lipitor, Zetia, aspirin, Plavix, Imdur   Chronic diastolic CHF (congestive heart failure) 2D echo on 05/30/2021 showed EF > 55%. 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 Removed Foley catheter, and attempt voiding trial while inpatient   Vaginal discharge possible yeast infection, Diflucan 150 mg x 1 dose given on 1/7       Diet: Regular diet DVT Prophylaxis: Subcutaneous Lovenox   Advance goals of care discussion: DNR  Family Communication:  None at bedside  Disposition:  SNF placement     Physical Exam: General: NAD  Cardiovascular: S1, S2 present Respiratory: CTAB Abdomen: Soft, nontender, nondistended, bowel sounds present Musculoskeletal: No bilateral pedal edema noted Skin: Normal Psychiatry: Normal mood   Vitals:   12/17/21 1955 12/18/21 0507 12/18/21 0756 12/18/21 1147  BP: (!) 115/57 (!) 105/51 (!) 100/55 (!) 102/47  Pulse: 76 62 68 60  Resp: 20 18 18 18   Temp: 97.7 F (36.5 C) 98.1 F (36.7 C) 97.8 F (36.6 C) 97.9 F (36.6 C)  TempSrc:  Oral    SpO2: 100% 92% 91% 93%  Weight:  Height:       No intake or output data in the 24 hours ending 12/18/21 1732  Filed Weights   12/09/21 0952 12/17/21 0530  Weight: 70.8 kg 67 kg    Data Reviewed: I have personally reviewed and interpreted daily labs, tele strips, imagings as discussed above. I reviewed all  nursing notes, pharmacy notes, vitals, pertinent old records I have discussed plan of care as described above with RN and patient/family.  CBC: Recent Labs  Lab 12/14/21 0704 12/15/21 0500 12/16/21 0447 12/17/21 0417 12/18/21 0553  WBC 7.6 10.3 9.9 9.9 7.7  HGB 9.7* 8.8* 9.0* 9.5* 8.6*  HCT 30.5* 28.2* 29.1* 29.6* 26.6*  MCV 85.2 85.5 85.6 84.3 83.9  PLT 466* 481* 468* 523* 277*   Basic Metabolic Panel: Recent Labs  Lab 12/14/21 0704 12/15/21 0500 12/16/21 0447 12/17/21 0417 12/18/21 0553  NA 137 134* 133* 132* 130*  K 4.2 4.1 4.0 4.5 4.0  CL 101 99 98 97* 95*  CO2 29 27 29 28 28   GLUCOSE 83 93 96 99 100*  BUN 12 16 15 14 17   CREATININE 0.79 0.76 0.67 0.82 0.91  CALCIUM 8.4* 8.3* 8.4* 8.4* 8.1*  MG  --   --  1.9 1.9 2.1  PHOS  --   --  4.6 5.6* 5.1*    Studies: No results found.  Scheduled Meds:  amLODipine  5 mg Oral Daily   vitamin C  500 mg Oral Daily   aspirin  81 mg Oral Daily   atorvastatin  40 mg Oral Daily   calcitonin (salmon)  1 spray Alternating Nares Daily   Chlorhexidine Gluconate Cloth  6 each Topical Daily   clopidogrel  75 mg Oral Daily   DULoxetine  60 mg Oral Daily   enoxaparin (LOVENOX) injection  0.5 mg/kg Subcutaneous Q24H   ezetimibe  10 mg Oral Daily   furosemide  20 mg Oral QODAY   gabapentin  600 mg Oral BID   hydroxychloroquine  200 mg Oral Daily   And   hydroxychloroquine  200 mg Oral QODAY   iron polysaccharides  150 mg Oral Daily   isosorbide mononitrate  15 mg Oral Daily   latanoprost  1 drop Both Eyes QHS   lidocaine  1 patch Transdermal Q24H   metoprolol tartrate  12.5 mg Oral BID   multivitamin with minerals  1 tablet Oral Daily   pantoprazole  40 mg Oral Daily   polyethylene glycol  17 g Oral Daily   predniSONE  5 mg Oral Q breakfast   Continuous Infusions:   PRN Meds: acetaminophen, albuterol, ALPRAZolam, bisacodyl, dextromethorphan-guaiFENesin, hydrALAZINE, methocarbamol, morphine injection, nitroGLYCERIN,  ondansetron (ZOFRAN) IV, oxyCODONE-acetaminophen, zolpidem    Author: Alma Friendly, MD [facility] 12/18/2021 5:32 PM  To reach On-call, see care teams to locate the attending and reach out to them via [email or web address]. If 7PM-7AM, please contact night-coverage

## 2021-12-18 NOTE — Progress Notes (Signed)
Occupational Therapy Treatment Patient Details Name: Erin [name] MRN: [id number] DOB: 1946-03-15 Today's Date: 12/18/2021   History of present illness Pt is admitted for L1 compression fx and is s/p kyphoplasty on 12/16/21. Extensive history includes HTN, HLD, GERD, depression, anxiety, RA, and pulm fibrosis. Cleared to mobilize by surgeon via secure chat.   OT comments  Ms. Cupples was seen for follow up regarding safe use of AE/DME for ADL management and safe adherence to back precautions. Pt agreeable to OT tx session, endorses mild pain with mobility but minimal pain at rest. Pt educated on safe use of LH reacher and sock aid to facilitate LB dressing while maximizing adherence to back precautions. Pt is able to return verbalize 3/3 back precautions with min cueing at start/end of session. She return demos understanding of AE during LB dressing task as described below (see ADL section for additional detail). Pt making good progress toward goals and continues to benefit from skilled OT services to maximize return to PLOF and minimize risk of future falls, injury, caregiver burden, and readmission. Will continue to follow POC. Discharge recommendation remains appropriate.      Recommendations for follow up therapy are one component of a multi-disciplinary discharge planning process, led by the attending physician.  Recommendations may be updated based on patient status, additional functional criteria and insurance authorization.    Follow Up Recommendations  Skilled nursing-short term rehab (<3 hours/day)    Assistance Recommended at Discharge Frequent or constant Supervision/Assistance  Patient can return home with the following  A lot of help with walking and/or transfers;A lot of help with bathing/dressing/bathroom;Assistance with cooking/housework;Assist for transportation;Help with stairs or ramp for entrance   Equipment Recommendations  BSC/3in1    Recommendations for Other Services       Precautions / Restrictions Precautions Precautions: Fall;Back Precaution Booklet Issued: Yes (comment) Required Braces or Orthoses: Spinal Brace Spinal Brace: Lumbar corset;Applied in sitting position Restrictions Weight Bearing Restrictions: No       Mobility Bed Mobility Overal bed mobility: Needs Assistance Bed Mobility: Supine to Sit     Supine to sit: Supervision Sit to supine: Min assist   General bed mobility comments: follows commands well. Able to perform log roll. Once seated at EOB, needs assist for donning LSO.    Transfers Overall transfer level: Needs assistance Equipment used: 1 person hand held assist Transfers: Sit to/from Stand Sit to Stand: Min assist   Step pivot transfers: Min assist       General transfer comment: Min A for side-stepping at EOB.     Balance Overall balance assessment: Needs assistance;History of Falls Sitting-balance support: Feet supported;No upper extremity supported Sitting balance-Leahy Scale: Fair Sitting balance - Comments: No LOB appreciated with weight shift at EOB.   Standing balance support: Reliant on assistive device for balance;Bilateral upper extremity supported;During functional activity Standing balance-Leahy Scale: Fair                             ADL either performed or assessed with clinical judgement   ADL Overall ADL's : Needs assistance/impaired                     Lower Body Dressing: Minimal assistance;Sit to/from stand;Cueing for compensatory techniques;With adaptive equipment;Cueing for safety Lower Body Dressing Details (indicate cue type and reason): MIN A to doff/don bilat hospital socks with min cueing for safe use of AE and adherence to back precautions t/o  session.             Functional mobility during ADLs: Minimal assistance;Rolling walker (2 wheels) General ADL Comments: Pt continues to be functionally limited by generalized weakness, increased pain with  functional mobility, and decreased activity tolerance.    Extremity/Trunk Assessment Upper Extremity Assessment Upper Extremity Assessment: Generalized weakness   Lower Extremity Assessment Lower Extremity Assessment: Generalized weakness   Cervical / Trunk Assessment Cervical / Trunk Assessment: Kyphotic    Vision Baseline Vision/History: 1 Wears glasses Patient Visual Report: No change from baseline     Perception     Praxis      Cognition Arousal/Alertness: Awake/alert Behavior During Therapy: WFL for tasks assessed/performed Overall Cognitive Status: Within Functional Limits for tasks assessed                                            Exercises Other Exercises Other Exercises: seated ther-ex performed on B LE including AP, hip abd/add, SLRs, and LAQ. 15 reps performed with supervision. Other Exercises: OT facilitates reinforcement of prior education on safe use of AE/DME for ADL management, Back precautions (handout provided), and compensatory strategies for ADL management in consideration of back precautions. OT facilitates seated LB dressing task. See ADL section for additional detail. Other Exercises: educated on back precautions and LSO brace   Shoulder Instructions       General Comments      Pertinent Vitals/ Pain       Pain Assessment: Faces Pain Score: 2  Faces Pain Scale: Hurts a little bit Pain Location: back Pain Descriptors / Indicators: Aching Pain Intervention(s): Limited activity within patient's tolerance;Monitored during session;Repositioned  Home Living Family/patient expects to be discharged to:: Private residence Living Arrangements: Alone Available Help at Discharge: Family;Available PRN/intermittently Type of Home: House                                  Prior Functioning/Environment              Frequency  Min 2X/week        Progress Toward Goals  OT Goals(current goals can now be found in  the care plan section)  Progress towards OT goals: Progressing toward goals  Acute Rehab OT Goals Patient Stated Goal: To get stronger. OT Goal Formulation: With patient Time For Goal Achievement: 12/31/21 Potential to Achieve Goals: Good  Plan Discharge plan remains appropriate;Frequency remains appropriate    Co-evaluation                 AM-PAC OT "6 Clicks" Daily Activity     Outcome Measure   Help from another person eating meals?: None Help from another person taking care of personal grooming?: A Little Help from another person toileting, which includes using toliet, bedpan, or urinal?: A Lot Help from another person bathing (including washing, rinsing, drying)?: A Lot Help from another person to put on and taking off regular upper body clothing?: A Little Help from another person to put on and taking off regular lower body clothing?: A Lot 6 Click Score: 16    End of Session Equipment Utilized During Treatment: Gait belt  OT Visit Diagnosis: Other abnormalities of gait and mobility (R26.89);Pain   Activity Tolerance Patient tolerated treatment well   Patient Left in bed;with call bell/phone within reach  Nurse Communication          Time: 0735-4301 OT Time Calculation (min): 26 min  Charges: OT General Charges $OT Visit: 1 Visit OT Treatments $Self Care/Home Management : 23-37 mins  Shara Blazing, M.S., OTR/L Feeding Team - [facility] Ascom: [phone] 12/18/21, 3:43 PM

## 2021-12-18 NOTE — Plan of Care (Signed)
  Problem: Clinical Measurements: Goal: Diagnostic test results will improve Outcome: Progressing   

## 2021-12-18 NOTE — Progress Notes (Signed)
Physical Therapy Treatment Patient Details Name: Erin [name] MRN: [id number] DOB: 1946-06-05 Today's Date: 12/18/2021   History of Present Illness Pt is admitted for L1 compression fx and is s/p kyphoplasty on 12/16/21. Extensive history includes HTN, HLD, GERD, depression, anxiety, RA, and pulm fibrosis. Cleared to mobilize by surgeon via secure chat.    PT Comments    Pt is making gradual progress towards goals with ability to ambulate very short distance in room using RW. Politely defers further ambulation distance this session due to fatigue. Good endurance with HEP and able to recall back precautions. Will continue to progress as able.  Recommendations for follow up therapy are one component of a multi-disciplinary discharge planning process, led by the attending physician.  Recommendations may be updated based on patient status, additional functional criteria and insurance authorization.  Follow Up Recommendations  Skilled nursing-short term rehab (<3 hours/day)     Assistance Recommended at Discharge Frequent or constant Supervision/Assistance  Patient can return home with the following A lot of help with walking and/or transfers;A lot of help with bathing/dressing/bathroom;Help with stairs or ramp for entrance   Equipment Recommendations   (TBD)    Recommendations for Other Services       Precautions / Restrictions Precautions Precautions: Fall;Back Precaution Booklet Issued: Yes (comment) Required Braces or Orthoses: Spinal Brace Spinal Brace: Lumbar corset;Applied in sitting position Restrictions Weight Bearing Restrictions: No     Mobility  Bed Mobility Overal bed mobility: Needs Assistance Bed Mobility: Supine to Sit     Supine to sit: Min assist     General bed mobility comments: follows commands well. Able to perform log roll. Once seated at EOB, needs assist for donning LSO.    Transfers Overall transfer level: Needs assistance Equipment used: Rolling  walker (2 wheels) Transfers: Bed to chair/wheelchair/BSC       Step pivot transfers: Min assist     General transfer comment: responds well to cues. Upright posture donned and good use of RW. Cues for pushing from seated surface.    Ambulation/Gait Ambulation/Gait assistance: Min assist Gait Distance (Feet): 4 Feet Assistive device: Rolling walker (2 wheels) Gait Pattern/deviations: Step-to pattern       General Gait Details: ambulated with step to gait pattern and min assist using RW. Safe technique and defers further distance this date.   Stairs             Wheelchair Mobility    Modified Rankin (Stroke Patients Only)       Balance Overall balance assessment: Needs assistance;History of Falls Sitting-balance support: Feet supported;Bilateral upper extremity supported Sitting balance-Leahy Scale: Fair Sitting balance - Comments: Fatigues qucily when seated without back support.   Standing balance support: Reliant on assistive device for balance;Bilateral upper extremity supported;During functional activity Standing balance-Leahy Scale: Fair                              Cognition Arousal/Alertness: Awake/alert Behavior During Therapy: WFL for tasks assessed/performed Overall Cognitive Status: Within Functional Limits for tasks assessed                                          Exercises Other Exercises Other Exercises: seated ther-ex performed on B LE including AP, hip abd/add, SLRs, and LAQ. 15 reps performed with supervision. Other Exercises: educated on back precautions and  LSO brace    General Comments        Pertinent Vitals/Pain Pain Assessment: 0-10 Pain Score: 2  Pain Location: back Pain Descriptors / Indicators: Aching Pain Intervention(s): Limited activity within patient's tolerance    Home Living                          Prior Function            PT Goals (current goals can now be found in  the care plan section) Acute Rehab PT Goals Patient Stated Goal: to go home PT Goal Formulation: With patient Time For Goal Achievement: 12/31/21 Potential to Achieve Goals: Good Progress towards PT goals: Progressing toward goals    Frequency    7X/week      PT Plan Current plan remains appropriate    Co-evaluation              AM-PAC PT "6 Clicks" Mobility   Outcome Measure  Help needed turning from your back to your side while in a flat bed without using bedrails?: A Little Help needed moving from lying on your back to sitting on the side of a flat bed without using bedrails?: A Little Help needed moving to and from a bed to a chair (including a wheelchair)?: A Little Help needed standing up from a chair using your arms (e.g., wheelchair or bedside chair)?: A Little Help needed to walk in hospital room?: A Lot Help needed climbing 3-5 steps with a railing? : A Lot 6 Click Score: 16    End of Session Equipment Utilized During Treatment: Back brace;Gait belt Activity Tolerance: Patient tolerated treatment well Patient left: in chair;with chair alarm set Nurse Communication: Mobility status PT Visit Diagnosis: Unsteadiness on feet (R26.81);Muscle weakness (generalized) (M62.81);Difficulty in walking, not elsewhere classified (R26.2);Pain Pain - Right/Left:  (bilat) Pain - part of body:  (back)     Time: 5003-7048 PT Time Calculation (min) (ACUTE ONLY): 27 min  Charges:  $Gait Training: 8-22 mins $Therapeutic Exercise: 8-22 mins                     Greggory Stallion, PT, DPT, GCS [phone]    Tzirel Leonor 12/18/2021, 1:17 PM

## 2021-12-19 DIAGNOSIS — I2510 Atherosclerotic heart disease of native coronary artery without angina pectoris: Secondary | ICD-10-CM | POA: Diagnosis not present

## 2021-12-19 DIAGNOSIS — S32010A Wedge compression fracture of first lumbar vertebra, initial encounter for closed fracture: Secondary | ICD-10-CM | POA: Diagnosis not present

## 2021-12-19 DIAGNOSIS — I5032 Chronic diastolic (congestive) heart failure: Secondary | ICD-10-CM | POA: Diagnosis not present

## 2021-12-19 DIAGNOSIS — F418 Other specified anxiety disorders: Secondary | ICD-10-CM | POA: Diagnosis not present

## 2021-12-19 LAB — CBC
HCT: 28.6 % — ABNORMAL LOW (ref 36.0–46.0)
Hemoglobin: 8.9 g/dL — ABNORMAL LOW (ref 12.0–15.0)
MCH: 26.3 pg (ref 26.0–34.0)
MCHC: 31.1 g/dL (ref 30.0–36.0)
MCV: 84.6 fL (ref 80.0–100.0)
Platelets: 493 10*3/uL — ABNORMAL HIGH (ref 150–400)
RBC: 3.38 MIL/uL — ABNORMAL LOW (ref 3.87–5.11)
RDW: 16.4 % — ABNORMAL HIGH (ref 11.5–15.5)
WBC: 7.3 10*3/uL (ref 4.0–10.5)
nRBC: 0 % (ref 0.0–0.2)

## 2021-12-19 LAB — RESP PANEL BY RT-PCR (FLU A&B, COVID) ARPGX2
Influenza A by PCR: NEGATIVE
Influenza B by PCR: NEGATIVE
SARS Coronavirus 2 by RT PCR: NEGATIVE

## 2021-12-19 LAB — BASIC METABOLIC PANEL
Anion gap: 8 (ref 5–15)
BUN: 14 mg/dL (ref 8–23)
CO2: 27 mmol/L (ref 22–32)
Calcium: 8.3 mg/dL — ABNORMAL LOW (ref 8.9–10.3)
Chloride: 95 mmol/L — ABNORMAL LOW (ref 98–111)
Creatinine, Ser: 0.7 mg/dL (ref 0.44–1.00)
GFR, Estimated: >60 mL/min (ref >60)
Glucose, Bld: 86 mg/dL (ref 70–99)
Potassium: 3.9 mmol/L (ref 3.5–5.1)
Sodium: 130 mmol/L — ABNORMAL LOW (ref 135–145)

## 2021-12-19 MED ORDER — POLYSACCHARIDE IRON COMPLEX 150 MG PO CAPS: 150.0000 mg | ORAL_CAPSULE | Freq: Every day | ORAL | Status: AC

## 2021-12-19 MED ORDER — METHOCARBAMOL 500 MG PO TABS: 500.0000 mg | ORAL_TABLET | Freq: Three times a day (TID) | ORAL | Status: DC | PRN

## 2021-12-19 MED ORDER — ACETAMINOPHEN 325 MG PO TABS: 650.0000 mg | ORAL_TABLET | Freq: Four times a day (QID) | ORAL | Status: AC | PRN

## 2021-12-19 MED ORDER — ALPRAZOLAM 0.5 MG PO TABS: 0.5000 mg | ORAL_TABLET | Freq: Four times a day (QID) | ORAL | 0 refills | Status: DC | PRN

## 2021-12-19 MED ORDER — OXYCODONE-ACETAMINOPHEN 5-325 MG PO TABS: 1.0000 | ORAL_TABLET | ORAL | 0 refills | Status: DC | PRN

## 2021-12-19 MED ORDER — ZOLPIDEM TARTRATE 10 MG PO TABS: 10.0000 mg | ORAL_TABLET | Freq: Every evening | ORAL | 0 refills | Status: DC | PRN

## 2021-12-19 MED ORDER — CALCITONIN (SALMON) 200 UNIT/ACT NA SOLN: 1.0000 | Freq: Every day | NASAL | 12 refills | Status: AC

## 2021-12-19 NOTE — TOC Progression Note (Signed)
Transition of Care [facility]) - Progression Note    Patient Details  Name: Erin [name] MRN: [id number] Date of Birth: 04-30-1946  [facility]) CM/SW [place], RN Phone Number: 12/19/2021, 2:29 PM  Clinical Narrative:   Called EMS to [facility] to [facility] She is the the 4th on the list         Expected Discharge Plan and Services           Expected Discharge Date: 12/19/21                                     Social Determinants of Health (SDOH) Interventions    Readmission Risk Interventions Readmission Risk Prevention Plan 05/29/2019  Transportation Screening Not Complete  Transportation Screening Comment Pt was independent and driving prior to getting sick, when she become sick her cousin provided transportation however cousin is now sick - will need to address closer to [place] or Home Care Consult Patient refused  Medication Review (RN Care Manager) Complete  Some recent data might be hidden

## 2021-12-19 NOTE — Care Management Important Message (Signed)
Important Message  Patient Details  Name: Erin [name] MRN: [id number] Date of Birth: [birth year]   Medicare Important Message Given:  Yes     Juliann Pulse A Nimrat Woolworth 12/19/2021, 1:42 PM

## 2021-12-19 NOTE — TOC Progression Note (Signed)
Transition of Care [facility]) - Progression Note    Patient Details  Name: Erin [name] MRN: [id number] Date of Birth: Sep 16, 1946  Transition of Care [facility]) CM/SW [place], RN Phone Number: 12/19/2021, 9:19 AM  Clinical Narrative:   Met with the patient and her Doristine Bosworth in the room, we reviewed the bed offers and they [facility],. I notified Tammy at [facility], the patient will go to room 711 today, The covid test will be done, she may need to go with a Foley in place, The physician will address that prior to DC, she will need to transport via EMS         Expected Discharge Plan and Services                                                 Social Determinants of Health (SDOH) Interventions    Readmission Risk Interventions Readmission Risk Prevention Plan 05/29/2019  Transportation Screening Not Complete  Transportation Screening Comment Pt was independent and driving prior to getting sick, when she become sick her cousin provided transportation however cousin is now sick - will need to address closer to [place] or Home Care Consult Patient refused  Medication Review (RN Care Manager) Complete  Some recent data might be hidden

## 2021-12-19 NOTE — Progress Notes (Signed)
Report called to RN at [facility], Rm 711. IV removed, cath tip intact. Foley placed, no complications. Patient bathed and dressed. All belongings returned to patient. Will await EMS transport.

## 2021-12-19 NOTE — Plan of Care (Signed)

## 2021-12-19 NOTE — Discharge Summary (Signed)
Physician Discharge Summary   Patient: Erin [name] MRN: [id number] DOB: Mar 28, 1946  Admit date:     12/09/2021  Discharge date: 12/19/21  Discharge Physician: Erin [name]   PCP: Erin Crouch, MD   Recommendations at discharge:   Follow up with Urology in 1-2 weeks, Erin [name], for foley removal and voiding trial Follow up with Erin [name] PCP, 1 week after discharge from SNF     Discharge Diagnoses Principal Problem:   Closed compression fracture of body of L1 vertebra ([place]) Active Problems:   Idiopathic interstitial fibrosis of lung syndrome ([place])   Hyperlipidemia   HTN, goal below 140/90   Rheumatoid arthritis ([place])   [name] S/P percutaneous coronary angioplasty   Chronic diastolic CHF (congestive heart failure) ([place])   Depression with anxiety   Normocytic anemia   Fall at home, initial encounter     [place] is a 76 y.o. 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   In the ED, MRI of the lumbar spine showed L1 compression fracture with 30% of height loss. Orthopedics consulted.  Patient is s/p L1 vertebral body augmentation with kyphoplasty on 1/9 by IR.         Closed compression fracture of body of L1 vertebra S/p L1 vertebral body augmentation with kyphoplasty on 1/9 by IR IR okay to restart Plavix on 12/18/2021   Frequent falls, unknown cause Vitamin B12 level, vitamin D level,  and folate levl WNL  Iron deficiency, iron level 25, saturation 10%, started oral iron supplement with vitamin C.   Idiopathic interstitial fibrosis of lung syndrome  At baseline   Hyperlipidemia Hypertension   Rheumatoid arthritis    [name] S/P percutaneous coronary angioplasty   Chronic diastolic CHF  (congestive heart failure) No evidence of flare   Depression with anxiety   Anemia of chronic disease/mild iron def/thrombocytosis Anemia panel showed iron 25, sats 10 Continue oral iron supplementation   Hyponatremia   Urinary retension Foley catheter was inserted on 12/12/2021, 1500 normal urine was obtained.  Voiding trial attempted twice no success.  Foley replaced at discharge.  This is new.  Needs Urology follow up in 1-2 weeks for voiding trial.    Yeast infection Diflucan 150 mg x 1 dose given on 1/7            Pain control - [facility] Controlled Substance Reporting System database was reviewed.    Consultants: Intervenmtional radiology Procedures performed: Kyphoplasty  Disposition: Skilled nursing facility Diet recommendation: Cardiac diet  DISCHARGE MEDICATION: Allergies as of 12/19/2021       Reactions   Remicade [infliximab] Shortness Of Breath, Itching   Sulfa Antibiotics Other (See Comments)   Other Reaction: tounge cracked   Actonel [risedronate Sodium] Other (See Comments)   Fosamax [alendronate Sodium]    GI Bleed   Procardia [nifedipine] Hives   Elemental Sulfur Swelling   Wellbutrin [bupropion] Anxiety        Medication List     STOP taking these medications    clobetasol ointment 0.05 % Commonly known as: TEMOVATE   dexlansoprazole 60 MG capsule Commonly known as: DEXILANT   HYDROcodone-acetaminophen 5-325 MG tablet Commonly known as: NORCO/VICODIN   predniSONE 5 MG tablet Commonly known as: DELTASONE       TAKE these medications  acetaminophen 325 MG tablet Commonly known as: TYLENOL Take 2 tablets (650 mg total) by mouth every 6 (six) hours as needed for mild pain or fever.   albuterol 108 (90 Base) MCG/ACT inhaler Commonly known as: VENTOLIN HFA Inhale 2 puffs into the lungs every 4 (four) hours as needed for wheezing or shortness of breath.   ALPRAZolam 0.5 MG tablet Commonly known as: XANAX Take 1 tablet (0.5  mg total) by mouth 4 (four) times daily as needed for anxiety.   amLODipine 5 MG tablet Commonly known as: NORVASC Take 1 tablet (5 mg total) by mouth daily.   aspirin 81 MG tablet Take 81 mg by mouth daily.   atorvastatin 40 MG tablet Commonly known as: LIPITOR Take 1 tablet (40 mg total) by mouth daily.   calcitonin (salmon) 200 UNIT/ACT nasal spray Commonly known as: MIACALCIN/FORTICAL Place 1 spray into alternate nostrils daily. Start taking on: December 20, 2021   clopidogrel 75 MG tablet Commonly known as: PLAVIX Take 1 tablet (75 mg total) by mouth daily.   DULoxetine 60 MG capsule Commonly known as: CYMBALTA Take 60 mg by mouth daily. In the morning.   ezetimibe 10 MG tablet Commonly known as: ZETIA Take 1 tablet (10 mg total) by mouth daily.   furosemide 20 MG tablet Commonly known as: LASIX Take 1 tablet (20 mg total) by mouth every other day. May take extra 20 mg for swelling or shortness of breath   gabapentin 600 MG tablet Commonly known as: NEURONTIN Take 1 tablet by mouth 3 (three) times daily.   hydroxychloroquine 200 MG tablet Commonly known as: PLAQUENIL Take 1 tablet (200 mg total) by mouth 2 (two) times daily.   iron polysaccharides 150 MG capsule Commonly known as: NIFEREX Take 1 capsule (150 mg total) by mouth daily. Start taking on: December 20, 2021   isosorbide mononitrate 30 MG 24 hr tablet Commonly known as: IMDUR Take 0.5 tablets (15 mg total) by mouth daily.   latanoprost 0.005 % ophthalmic solution Commonly known as: XALATAN Place 1 drop into both eyes at bedtime.   methocarbamol 500 MG tablet Commonly known as: ROBAXIN Take 1 tablet (500 mg total) by mouth every 8 (eight) hours as needed for muscle spasms.   metoprolol tartrate 25 MG tablet Commonly known as: LOPRESSOR Take 0.5 tablets (12.5 mg total) by mouth 2 (two) times daily.   multivitamin with minerals Tabs tablet Take 1 tablet by mouth daily.   nitroGLYCERIN 0.4 MG  SL tablet Commonly known as: NITROSTAT DISSOLVE 1 TABLET UNDER THE TONGUE EVERY5 MINUTES AS NEEDED FOR CHEST PAIN   Ofev 150 MG Caps Generic drug: Nintedanib Take 1 capsule (150 mg total) by mouth daily.   oxyCODONE-acetaminophen 5-325 MG tablet Commonly known as: PERCOCET/ROXICET Take 1 tablet by mouth every 4 (four) hours as needed for moderate pain.   polyethylene glycol 17 g packet Commonly known as: MIRALAX / GLYCOLAX Take 17 g by mouth daily as needed (constipation.).   zolpidem 10 MG tablet Commonly known as: AMBIEN Take 1 tablet (10 mg total) by mouth at bedtime as needed for sleep.        Follow-up Information     Sparks, [name], MD. Schedule an appointment as soon as possible for a visit.   Specialty: Internal Medicine Why: One week after discharge from SNF Contact information: [address] [place] [address] [phone]         Billey Co, MD. Schedule an appointment  as soon as possible for a visit in 1 week(s).   Specialty: Urology Contact information: [place] [address] [phone]                Discharge Instructions     Increase activity slowly   Complete by: As directed    No wound care   Complete by: As directed         Discharge Exam: Filed Weights   12/09/21 0952 12/17/21 0530  Weight: 70.8 kg 67 kg   General: Pt is alert, awake, not in acute distress sitting up in bed, stigmata of scleroderma noted diffusely Cardiovascular: RRR, nl A7-G8, soft systolic murmur .   No LE edema.   Respiratory: Normal respiratory rate and rhythm.  CTAB without rales or wheezes. Abdominal: Abdomen soft and non-tender.  No distension or HSM.   Neuro/Psych: Strength symmetric in upper and lower extremities.  Judgment and insight appear normal.   Condition at discharge: good  The results of significant diagnostics from this hospitalization (including imaging, microbiology,  ancillary and laboratory) are listed below for reference.   Imaging Studies: DG Lumbar Spine Complete  Result Date: 12/09/2021 CLINICAL DATA:  Fall, bilateral hip pain, low back pain EXAM: LUMBAR SPINE - COMPLETE 4+ VIEW COMPARISON:  Prior examination of 11/18/2021 is unavailable for direct comparison. FINDINGS: 6 mm retrolisthesis of L2 upon L3. 3 mm retrolisthesis of L1 upon L2. There is a mild compression deformity of L1 with mild loss of height but no retropulsion identified. There is no paravertebral soft tissue swelling identified to suggest an acute fracture. Mild dextroscoliosis of the lumbar spine centered at L2. Intervertebral disc space narrowing and endplate remodeling at T1-5 and L2-3 is in keeping with moderate to severe degenerative disc disease with exuberant left lateral disc osteophyte formation at L2-3. Remaining intervertebral disc heights are preserved. Remaining vertebral body heights are preserved. Paraspinal soft tissues are unremarkable. IMPRESSION: Remote appearing mild compression deformity of L1 without retropulsion. Advanced degenerative disc disease L1-2 and L2-3 with associated retrolisthesis at these levels. No acute fracture or traumatic listhesis of the lumbar spine. Electronically Signed   By: Fidela Salisbury M.D.   On: 12/09/2021 01:50   CT HEAD WO CONTRAST (5MM)  Result Date: 12/09/2021 CLINICAL DATA:  Head trauma.  Fall 12/08/2021 and 11/30/2021 EXAM: CT HEAD WITHOUT CONTRAST TECHNIQUE: Contiguous axial images were obtained from the base of the skull through the vertex without intravenous contrast. COMPARISON:  MR head 09/29/2017.  CT head 04/08/2017 FINDINGS: Brain: Mild atrophy moderate diffuse white matter hypoattenuation has progressed bilaterally. 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 As ganglia are intact. No acute infarct, hemorrhage, or mass lesion is present. The ventricles are proportionate to the degree of atrophy.  No significant extraaxial fluid collection is present. The brainstem and cerebellum are within normal limits. Vascular: Atherosclerotic calcifications are present within the cavernous internal carotid arteries bilaterally. No hyperdense vessel is present. Skull: Calvarium is intact. No focal lytic or blastic lesions are present. No significant extracranial soft tissue lesion is present. Sinuses/Orbits: The paranasal sinuses and mastoid air cells are clear. Bilateral lens replacements are noted. Globes and orbits are otherwise unremarkable. IMPRESSION: 1. Progressive atrophy and white matter disease. This likely reflects the sequela of chronic microvascular ischemia. 2. 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 3. No acute intracranial abnormality or significant interval change. No acute hemorrhage Electronically Signed   By: Harrell Gave  Mattern M.D.   On: 12/09/2021 14:40   CT PELVIS WO CONTRAST  Result Date: 12/09/2021 CLINICAL DATA:  Hip trauma with fracture suspected EXAM: CT PELVIS WITHOUT CONTRAST TECHNIQUE: Multidetector CT imaging of the pelvis was performed following the standard protocol without intravenous contrast. COMPARISON:  Preceding lumbar MRI FINDINGS: No visible fracture at the level of sacral edematous appearance on prior study. There is greater bony density at this level without out right sclerotic lesion. The adjacent attaching piriformis is thicker than the left, this may be reactive changes from altered weight-bearing or healing remote trauma. No hip fracture or dislocation.  Left hip arthroplasty. Osteopenia and advanced lower lumbar spine degeneration. No acute soft tissue finding. Tensor fossa lata atrophy and fatty infiltration on the right. Atherosclerosis. Colonic diverticulosis. IMPRESSION: No acute finding. Electronically Signed   By: Jorje Guild M.D.   On: 12/09/2021 07:48   MR LUMBAR SPINE WO CONTRAST  Result Date: 12/09/2021 CLINICAL  DATA:  Fall Christmas Eve with back pain EXAM: MRI LUMBAR SPINE WITHOUT CONTRAST TECHNIQUE: Multiplanar, multisequence MR imaging of the lumbar spine was performed. No intravenous contrast was administered. COMPARISON:  07/31/2017 lumbar FINDINGS: Segmentation:  5 lumbar type vertebrae Alignment: Grade 1 anterolisthesis at L4-5 and L5-S1. Mild retrolisthesis at L2-3. Vertebrae: L1 body compression fracture with inferior endplate depression. There is marrow edema around a horizontal fluid containing fracture cleft. Height loss measures up to 30% with mild retropulsion of the posteroinferior corner. Mild marrow edematous signal at the inferior right S1 level seen on sagittal images. Conus medullaris and cauda equina: Conus extends to the L2 level. Conus and cauda equina appear normal. Paraspinal and other soft tissues: No perispinal mass or inflammation. Disc levels: T12- L1: Unremarkable. L1-L2: Unremarkable. L2-L3: Disc collapse with endplate ridging and disc bulging eccentric to the left where there is also asymmetric facet spurring. Left foraminal impingement L3-L4: Mild disc bulging and facet spurring L4-L5: Facet spurring with anterolisthesis. Disc space narrowing and bulging with annular fissuring. L5-S1:Disc narrowing and bulging with annular fissure and facet spurring. Moderate right foraminal impingement. Right subarticular recess narrowing without static compression. IMPRESSION: 1. Acute L1 compression fracture with 30% height loss and mild, noncompressive retropulsion. 2. Partially covered edema in the lower right sacrum, question insufficiency fracture at this level. 3. Lumbar spine degeneration with foraminal impingement on the left at L2-3 and to a lesser extent on the right at L5-S1. Electronically Signed   By: Jorje Guild M.D.   On: 12/09/2021 04:11   IR KYPHO LUMBAR INC FX REDUCE BONE BX UNI/BIL CANNULATION INC/IMAGING  Result Date: 12/16/2021 CLINICAL DATA:  History of fall. Symptomatic L1  compression fracture deformity. Failed conservative medical management EXAM: FLUOROSCOPIC GUIDED KYPHOPLASTY OF L1 VERTEBRAL BODY COMPARISON:  MR L-spine and lumbar XRs, 12/09/2021. MEDICATIONS: As antibiotic prophylaxis, Ancef 2 g IV was ordered pre-procedure and administered intravenously within 1 hour of incision. ANESTHESIA/SEDATION: Moderate (conscious) sedation was employed during this procedure. A total of Versed 4 mg and Fentanyl 200 mcg was administered intravenously. Moderate Sedation Time: 72 minutes. The patient's level of consciousness and vital signs were monitored continuously by radiology nursing throughout the procedure under my direct supervision. FLUOROSCOPY TIME:  12 minutes, 36 seconds (175.1 mGy) COMPLICATIONS: None immediate. TECHNIQUE: The procedure, risks (including but not limited to bleeding, infection, organ damage), benefits, and alternatives were explained to the patient. Questions regarding the procedure were encouraged and answered. The patient understands and consents to the procedure. The patient was placed prone on the fluoroscopic  table. The skin overlying the upper thoracic region was then prepped and draped in the usual sterile fashion. Maximal barrier sterile technique was utilized including caps, mask, sterile gowns, sterile gloves, sterile drape, hand hygiene and skin antiseptic. Intravenous Fentanyl and Versed were administered as conscious sedation during continuous cardiorespiratory monitoring by the radiology RN. The LEFT pedicle at L1 vertebral body was then infiltrated with 1% lidocaine followed by the advancement of a Kyphon trocar needle transpedicular at the LEFT pedicle into the posterior one-third of the vertebral body. Subsequently, the osteo drill was advanced to the anterior third of the vertebral body. The osteo drill was retracted. The maneuvers were repeated for trocar needle insertion into the RIGHT pedicle at L1 vertebral body. Through the working cannulas,  Kyphon inflatable bone tamps 15 x 3 was advanced and positioned with the distal marker approximately 5 mm from the anterior aspect of the cortex. Appropriate positioning was confirmed on the AP projection. At this time, the balloons were expanded using contrast via a Kyphon inflation syringe device via micro tubing. Inflations was continued until there was near apposition with the superior end plate. At this time, methylmethacrylate (PMMA) mixture was reconstituted in the Kyphon bone mixing device system. This was then loaded into the delivery mechanism, attached to Kyphon bone filler. The balloons were deflated and removed followed by the instillation of methylmethacrylate mixture with excellent filling in the AP and lateral projections. Approximately 8 mL PMMA was administered. No extravasation was noted in the disk spaces or posteriorly into the spinal canal. No epidural venous contamination was seen. The working cannula and the bone filler was then retrieved and removed. Hemostasis was achieved with manual compression. The patient tolerated the procedure well without immediate postprocedural complication. IMPRESSION: Successful L1 vertebral body augmentation using balloon kyphoplasty, using a bi-pedicular approach, as above. Michaelle Birks, MD [facility] Electronically Signed   By: Michaelle Birks M.D.   On: 12/16/2021 17:51   DG Hip Unilat W or Wo Pelvis 2-3 Views Left  Result Date: 12/09/2021 CLINICAL DATA:  Fall, left hip pain EXAM: DG HIP (WITH OR WITHOUT PELVIS) 2-3V LEFT COMPARISON:  None. FINDINGS: Left hip bipolar hemiarthroplasty is in place. No acute fracture or dislocation. Vascular calcifications are noted within the medial left thigh. IMPRESSION: Negative. Electronically Signed   By: Fidela Salisbury M.D.   On: 12/09/2021 01:52   DG Hip Unilat W or Wo Pelvis 2-3 Views Right  Result Date: 12/09/2021 CLINICAL DATA:  Fall, bilateral hip pain  EXAM: DG HIP (WITH OR WITHOUT PELVIS) 2-3V RIGHT COMPARISON:  None. FINDINGS: Normal alignment. No fracture or dislocation. Right hip joint space is preserved. Left hip bipolar hemiarthroplasty has been performed. IMPRESSION: Negative. Electronically Signed   By: Fidela Salisbury M.D.   On: 12/09/2021 01:51    Microbiology: Results for orders placed or performed during the hospital encounter of 12/09/21  Resp Panel by RT-PCR (Flu A&B, Covid) Nasopharyngeal Swab     Status: None   Collection Time: 12/09/21  5:12 AM   Specimen: Nasopharyngeal Swab; Nasopharyngeal(NP) swabs in vial transport medium  Result Value Ref Range Status   SARS Coronavirus 2 by RT PCR NEGATIVE NEGATIVE Final    Comment: (NOTE) SARS-CoV-2 target nucleic acids are NOT DETECTED.  The SARS-CoV-2 RNA is generally detectable in upper respiratory specimens during the acute phase of infection. The lowest concentration of SARS-CoV-2 viral copies this assay can detect is 138 copies/mL. A negative result does not preclude SARS-Cov-2  infection and should not be used as the sole basis for treatment or other patient management decisions. A negative result may occur with  improper specimen collection/handling, submission of specimen other than nasopharyngeal swab, presence of viral mutation(s) within the areas targeted by this assay, and inadequate number of viral copies(<138 copies/mL). A negative result must be combined with clinical observations, patient history, and epidemiological information. The expected result is Negative.  Fact Sheet for Patients:  EntrepreneurPulse.com.au  Fact Sheet for Healthcare Providers:  IncredibleEmployment.be  This test is no t yet approved or cleared by the [place] FDA and  has been authorized for detection and/or diagnosis of SARS-CoV-2 by FDA under an Emergency Use Authorization (EUA). This EUA will remain  in effect (meaning this test can be used)  for the duration of the COVID-19 declaration under Section 564(b)(1) of the Act, 21 U.S.C.section 360bbb-3(b)(1), unless the authorization is terminated  or revoked sooner.       Influenza A by PCR NEGATIVE NEGATIVE Final   Influenza B by PCR NEGATIVE NEGATIVE Final    Comment: (NOTE) The Xpert Xpress SARS-CoV-2/FLU/RSV plus assay is intended as an aid in the diagnosis of influenza from Nasopharyngeal swab specimens and should not be used as a sole basis for treatment. Nasal washings and aspirates are unacceptable for Xpert Xpress SARS-CoV-2/FLU/RSV testing.  Fact Sheet for Patients: EntrepreneurPulse.com.au  Fact Sheet for Healthcare Providers: IncredibleEmployment.be  This test is not yet approved or cleared by the [place] FDA and has been authorized for detection and/or diagnosis of SARS-CoV-2 by FDA under an Emergency Use Authorization (EUA). This EUA will remain in effect (meaning this test can be used) for the duration of the COVID-19 declaration under Section 564(b)(1) of the Act, 21 U.S.C. section 360bbb-3(b)(1), unless the authorization is terminated or revoked.  Performed at [facility], [address] [place],  [address]   Resp Panel by RT-PCR (Flu A&B, Covid) Nasopharyngeal Swab     Status: None   Collection Time: 12/19/21 10:53 AM   Specimen: Nasopharyngeal Swab; Nasopharyngeal(NP) swabs in vial transport medium  Result Value Ref Range Status   SARS Coronavirus 2 by RT PCR NEGATIVE NEGATIVE Final    Comment: (NOTE) SARS-CoV-2 target nucleic acids are NOT DETECTED.  The SARS-CoV-2 RNA is generally detectable in upper respiratory specimens during the acute phase of infection. The lowest concentration of SARS-CoV-2 viral copies this assay can detect is 138 copies/mL. A negative result does not preclude SARS-Cov-2 infection and should not be used as the sole basis for treatment or other patient  management decisions. A negative result may occur with  improper specimen collection/handling, submission of specimen other than nasopharyngeal swab, presence of viral mutation(s) within the areas targeted by this assay, and inadequate number of viral copies(<138 copies/mL). A negative result must be combined with clinical observations, patient history, and epidemiological information. The expected result is Negative.  Fact Sheet for Patients:  EntrepreneurPulse.com.au  Fact Sheet for Healthcare Providers:  IncredibleEmployment.be  This test is no t yet approved or cleared by the [place] FDA and  has been authorized for detection and/or diagnosis of SARS-CoV-2 by FDA under an Emergency Use Authorization (EUA). This EUA will remain  in effect (meaning this test can be used) for the duration of the COVID-19 declaration under Section 564(b)(1) of the Act, 21 U.S.C.section 360bbb-3(b)(1), unless the authorization is terminated  or revoked sooner.       Influenza A by PCR NEGATIVE NEGATIVE Final   Influenza B  by PCR NEGATIVE NEGATIVE Final    Comment: (NOTE) The Xpert Xpress SARS-CoV-2/FLU/RSV plus assay is intended as an aid in the diagnosis of influenza from Nasopharyngeal swab specimens and should not be used as a sole basis for treatment. Nasal washings and aspirates are unacceptable for Xpert Xpress SARS-CoV-2/FLU/RSV testing.  Fact Sheet for Patients: EntrepreneurPulse.com.au  Fact Sheet for Healthcare Providers: IncredibleEmployment.be  This test is not yet approved or cleared by the [place] FDA and has been authorized for detection and/or diagnosis of SARS-CoV-2 by FDA under an Emergency Use Authorization (EUA). This EUA will remain in effect (meaning this test can be used) for the duration of the COVID-19 declaration under Section 564(b)(1) of the Act, 21 U.S.C. section 360bbb-3(b)(1),  unless the authorization is terminated or revoked.  Performed at [facility], [address] [place], [place] [address]     Labs: CBC: Recent Labs  Lab 12/15/21 0500 12/16/21 0447 12/17/21 0417 12/18/21 0553 12/19/21 0630  WBC 10.3 9.9 9.9 7.7 7.3  HGB 8.8* 9.0* 9.5* 8.6* 8.9*  HCT 28.2* 29.1* 29.6* 26.6* 28.6*  MCV 85.5 85.6 84.3 83.9 84.6  PLT 481* 468* 523* 469* 628*   Basic Metabolic Panel: Recent Labs  Lab 12/15/21 0500 12/16/21 0447 12/17/21 0417 12/18/21 0553 12/19/21 0630  NA 134* 133* 132* 130* 130*  K 4.1 4.0 4.5 4.0 3.9  CL 99 98 97* 95* 95*  CO2 27 29 28 28 27   GLUCOSE 93 96 99 100* 86  BUN 16 15 14 17 14   CREATININE 0.76 0.67 0.82 0.91 0.70  CALCIUM 8.3* 8.4* 8.4* 8.1* 8.3*  MG  --  1.9 1.9 2.1  --   PHOS  --  4.6 5.6* 5.1*  --    Liver Function Tests: No results for input(s): AST, ALT, ALKPHOS, BILITOT, PROT, ALBUMIN in the last 168 hours. CBG: No results for input(s): GLUCAP in the last 168 hours.  Discharge time spent: 40 minutes  Signed: Edwin Dada, MD [facility] 12/19/2021

## 2021-12-19 NOTE — NC FL2 (Signed)
[place] LEVEL OF CARE SCREENING TOOL     IDENTIFICATION  Patient Name: Erin [name] Birthdate: 1946/11/20 Sex: female Admission Date (Current Location): 12/09/2021  [facility] and [place] Number:  Engineering geologist and Address:  [facility], [address], [place], [place] [address]      Provider Number: [id number]  Attending Physician Name and Address:  Edwin Dada, *  Relative Name and Phone Number:  [name] [phone] [place]    Current Level of Care: Hospital Recommended Level of Care: [place] Prior Approval Number:    Date Approved/Denied:   PASRR Number: [id number]  Discharge Plan: SNF    Current Diagnoses: Patient Active Problem List   Diagnosis Date Noted   Closed compression fracture of body of L1 vertebra ([place]) 12/09/2021   Chronic diastolic CHF (congestive heart failure) ([place]) 12/09/2021   Depression with anxiety 12/09/2021   Normocytic anemia    Fall at home, initial encounter    Angina pectoris ([place]) 11/21/2020   Unstable angina ([place]) 10/30/2020   Bilateral carotid artery stenosis 10/30/2020   Colon polyp 08/31/2019   [name] S/P percutaneous coronary angioplasty 08/31/2019   Degenerative disc disease, lumbar 08/31/2019   Dysphagia 08/31/2019   Environmental allergies 08/31/2019   Esophageal dysmotility 08/31/2019   Fibrocystic breast disease 08/31/2019   Mitral regurgitation 08/31/2019   Acute respiratory disease due to COVID-19 virus 05/28/2019   Food in esophagus causing other injury, initial encounter 04/21/2019   Gastro-esophageal reflux disease with esophagitis 04/21/2019   Gastric nodule 04/21/2019   Varicose veins of leg with swelling, bilateral 37/62/8315   Acute diastolic CHF (congestive heart failure) ([place]) 03/18/2018   Lower extremity edema 03/18/2018   Age-related osteoporosis with current pathological fracture 03/04/2018   Symptomatic anemia 02/28/2018   S/P hip  hemiarthroplasty 02/26/2018   Hip fracture ([place]) 02/22/2018   Lumbar radiculopathy 01/20/2018   Polyneuropathy 01/20/2018   Chronic insomnia 01/15/2018   Hyperlipidemia 10/26/2017   Coronary artery disease of native artery of native heart with stable angina pectoris ([place]) 10/24/2017   Vulvar irritation 09/02/2017   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) 12/16/2016   SOB (shortness of breath) 12/15/2016   Idiopathic interstitial fibrosis of lung syndrome ([place]) 12/15/2016   Raynaud's disease 12/15/2016   Melanoma of skin ([place]) 11/11/2016   Lesion of labia 11/11/2016   HTN, goal below 140/90 08/29/2014   Encounter for long-term current use of medication 03/30/2014   Scleroderma ([place]) 03/15/2014   Rheumatoid arthritis ([place]) 03/15/2014   Osteopenia 03/15/2014    Orientation RESPIRATION BLADDER Height & Weight     Self, Time, Situation, Place  Normal Continent Weight: 67 kg Height:  5' (152.4 cm)  BEHAVIORAL SYMPTOMS/MOOD NEUROLOGICAL BOWEL NUTRITION STATUS      Continent Diet (regular)  AMBULATORY STATUS COMMUNICATION OF NEEDS Skin   Extensive Assist Verbally Normal                       Personal Care Assistance Level of Assistance  Bathing, Feeding, Dressing Bathing Assistance: Maximum assistance Feeding assistance: Independent Dressing Assistance: Maximum assistance     Functional Limitations Info             SPECIAL CARE FACTORS FREQUENCY  PT (By licensed PT), OT (By licensed OT)     PT Frequency: 5 times per week OT Frequency: 5 times per week  Contractures Contractures Info: Not present    Additional Factors Info  Code Status, Allergies Code Status Info: DNR Allergies Info: Remicade (Infliximab), Sulfa Antibiotics, Actonel (Risedronate Sodium), Fosamax (Alendronate Sodium), Procardia (Nifedipine), Elemental Sulfur, Wellbutrin (Bupropion           Current  Medications (12/19/2021):  This is the current hospital active medication list Current Facility-Administered Medications  Medication Dose Route Frequency Provider Last Rate Last Admin   acetaminophen (TYLENOL) tablet 650 mg  650 mg Oral Q6H PRN Ivor Costa, MD       albuterol (PROVENTIL) (2.5 MG/3ML) 0.083% nebulizer solution 3 mL  3 mL Inhalation Q4H PRN Ivor Costa, MD       ALPRAZolam Duanne Moron) tablet 0.5 mg  0.5 mg Oral TID PRN Ivor Costa, MD   0.5 mg at 12/18/21 2147   amLODipine (NORVASC) tablet 5 mg  5 mg Oral Daily Ivor Costa, MD   5 mg at 12/19/21 0940   ascorbic acid (VITAMIN C) tablet 500 mg  500 mg Oral Daily Val Riles, MD   500 mg at 12/19/21 0941   aspirin chewable tablet 81 mg  81 mg Oral Daily Ivor Costa, MD   81 mg at 12/19/21 0942   atorvastatin (LIPITOR) tablet 40 mg  40 mg Oral Daily Ivor Costa, MD   40 mg at 12/19/21 0941   bisacodyl (DULCOLAX) EC tablet 10 mg  10 mg Oral Daily PRN Val Riles, MD   10 mg at 12/17/21 1633   calcitonin (salmon) (MIACALCIN/FORTICAL) nasal spray 1 spray  1 spray Alternating Nares Daily Val Riles, MD   1 spray at 12/17/21 1010   Chlorhexidine Gluconate Cloth 2 % PADS 6 each  6 each Topical Daily Val Riles, MD   6 each at 12/19/21 0943   clopidogrel (PLAVIX) tablet 75 mg  75 mg Oral Daily Alma Friendly, MD   75 mg at 12/19/21 0258   dextromethorphan-guaiFENesin ([place] DM) 30-600 MG per 12 hr tablet 1 tablet  1 tablet Oral BID PRN Ivor Costa, MD       DULoxetine (CYMBALTA) DR capsule 60 mg  60 mg Oral Daily Ivor Costa, MD   60 mg at 12/19/21 0942   enoxaparin (LOVENOX) injection 35 mg  0.5 mg/kg Subcutaneous Q24H Ivor Costa, MD   35 mg at 12/18/21 2146   ezetimibe (ZETIA) tablet 10 mg  10 mg Oral Daily Ivor Costa, MD   10 mg at 12/19/21 5277   furosemide (LASIX) tablet 20 mg  20 mg Oral Melvenia Needles, MD   20 mg at 12/19/21 0941   gabapentin (NEURONTIN) tablet 600 mg  600 mg Oral BID Val Riles, MD   600 mg at 12/19/21  8242   hydrALAZINE (APRESOLINE) injection 5 mg  5 mg Intravenous Q2H PRN Ivor Costa, MD       hydroxychloroquine (PLAQUENIL) tablet 200 mg  200 mg Oral Daily Dorothe Pea, RPH   200 mg at 12/19/21 0940   And   hydroxychloroquine (PLAQUENIL) tablet 200 mg  200 mg Oral QODAY Virl Cagey E, RPH   200 mg at 12/18/21 0948   iron polysaccharides (NIFEREX) capsule 150 mg  150 mg Oral Daily Val Riles, MD   150 mg at 12/19/21 0943   isosorbide mononitrate (IMDUR) 24 hr tablet 15 mg  15 mg Oral Daily Ivor Costa, MD   15 mg at 12/19/21 0939   latanoprost (XALATAN) 0.005 % ophthalmic solution 1 drop  1 drop Both Eyes QHS Ivor Costa,  MD   1 drop at 12/18/21 2148   lidocaine (LIDODERM) 5 % 1 patch  1 patch Transdermal Q24H Ivor Costa, MD   1 patch at 12/19/21 0944   methocarbamol (ROBAXIN) tablet 500 mg  500 mg Oral Q8H PRN Ivor Costa, MD   500 mg at 12/18/21 1512   metoprolol tartrate (LOPRESSOR) tablet 12.5 mg  12.5 mg Oral BID Ivor Costa, MD   12.5 mg at 12/19/21 0940   morphine 2 MG/ML injection 2 mg  2 mg Intravenous Q4H PRN Ivor Costa, MD   2 mg at 12/09/21 2142   multivitamin with minerals tablet 1 tablet  1 tablet Oral Daily Ivor Costa, MD   1 tablet at 12/19/21 6389   nitroGLYCERIN (NITROSTAT) SL tablet 0.4 mg  0.4 mg Sublingual Q5 min PRN Ivor Costa, MD       ondansetron [facility]) injection 4 mg  4 mg Intravenous Q8H PRN Ivor Costa, MD       oxyCODONE-acetaminophen (PERCOCET/ROXICET) 5-325 MG per tablet 1 tablet  1 tablet Oral Q4H PRN Ivor Costa, MD   1 tablet at 12/18/21 2146   pantoprazole (PROTONIX) EC tablet 40 mg  40 mg Oral Daily Ivor Costa, MD   40 mg at 12/19/21 0941   polyethylene glycol (MIRALAX / GLYCOLAX) packet 17 g  17 g Oral Daily Val Riles, MD   17 g at 12/19/21 0943   predniSONE (DELTASONE) tablet 5 mg  5 mg Oral Q breakfast Ivor Costa, MD   5 mg at 12/19/21 0940   zolpidem (AMBIEN) tablet 5 mg  5 mg Oral QHS PRN Ivor Costa, MD   5 mg at 12/18/21 2147     Discharge  Medications: Please see discharge summary for a list of discharge medications.  Relevant Imaging Results:  Relevant Lab Results:   Additional Information SS#[id number] [place], RN

## 2021-12-23 ENCOUNTER — Telehealth: Payer: Self-pay | Admitting: Urology

## 2021-12-23 NOTE — Telephone Encounter (Signed)
LMOM for pt to call office to schedule voiding trial in 2 weeks AM w/[place], PM on nurse schedule.

## 2021-12-26 ENCOUNTER — Other Ambulatory Visit: Payer: Self-pay

## 2021-12-26 ENCOUNTER — Emergency Department
Admission: EM | Admit: 2021-12-26 | Discharge: 2021-12-27 | Disposition: A | Discharge status: Skilled Nursing Facility | Payer: Medicare Other | Attending: Emergency Medicine | Admitting: Emergency Medicine

## 2021-12-26 DIAGNOSIS — E871 Hypo-osmolality and hyponatremia: Secondary | ICD-10-CM | POA: Diagnosis not present

## 2021-12-26 DIAGNOSIS — I2510 Atherosclerotic heart disease of native coronary artery without angina pectoris: Secondary | ICD-10-CM | POA: Insufficient documentation

## 2021-12-26 DIAGNOSIS — I509 Heart failure, unspecified: Secondary | ICD-10-CM | POA: Insufficient documentation

## 2021-12-26 DIAGNOSIS — I110 Hypertensive heart disease with heart failure: Secondary | ICD-10-CM | POA: Diagnosis not present

## 2021-12-26 LAB — CBC
HCT: 31.6 % — ABNORMAL LOW (ref 36.0–46.0)
Hemoglobin: 10.2 g/dL — ABNORMAL LOW (ref 12.0–15.0)
MCH: 27.1 pg (ref 26.0–34.0)
MCHC: 32.3 g/dL (ref 30.0–36.0)
MCV: 84 fL (ref 80.0–100.0)
Platelets: 553 10*3/uL — ABNORMAL HIGH (ref 150–400)
RBC: 3.76 MIL/uL — ABNORMAL LOW (ref 3.87–5.11)
RDW: 16.1 % — ABNORMAL HIGH (ref 11.5–15.5)
WBC: 9.6 10*3/uL (ref 4.0–10.5)
nRBC: 0 % (ref 0.0–0.2)

## 2021-12-26 LAB — BASIC METABOLIC PANEL
Anion gap: 10 (ref 5–15)
BUN: 12 mg/dL (ref 8–23)
CO2: 26 mmol/L (ref 22–32)
Calcium: 8.9 mg/dL (ref 8.9–10.3)
Chloride: 91 mmol/L — ABNORMAL LOW (ref 98–111)
Creatinine, Ser: 0.84 mg/dL (ref 0.44–1.00)
GFR, Estimated: >60 mL/min (ref >60)
Glucose, Bld: 107 mg/dL — ABNORMAL HIGH (ref 70–99)
Potassium: 4.3 mmol/L (ref 3.5–5.1)
Sodium: 127 mmol/L — ABNORMAL LOW (ref 135–145)

## 2021-12-26 MED ORDER — SODIUM CHLORIDE 0.9 % IV BOLUS
500.0000 mL | Freq: Once | INTRAVENOUS | Status: AC
  Administered 2021-12-27: 500 mL via INTRAVENOUS

## 2021-12-26 NOTE — ED Notes (Signed)
First nurse-pt brought in via ems from [facility].  Pt has a sodium of 125.  Bp-116/58.p-86,sats 92% on 2 liters.  Pt alert. Pt in recliner in lobby.

## 2021-12-26 NOTE — ED Provider Notes (Signed)
[facility] Provider Note    Event Date/Time   First MD Initiated Contact with Patient 12/26/21 2302     (approximate)   History   Low-sodium HPI  Erin [name] is a 76 y.o. female  hypertension, hyperlipidemia, GERD, depression, anxiety, rheumatoid arthritis, scleroderma, pulmonary fibrosis, mitral valve regurgitation, dysphagia due to dysmotility of the esophagus, [name], anemia, dCHF who comes in with concerns for abnormal lab.  Patient was sent in due to a sodium of 125.  Contrary to triage note patient is alert and oriented x3.  She denies any concerns.  She notes that she was sent here due to her low sodium level.  She reports feeling at her baseline self.  She states that she is not sure what medications she is taking in her facility and that she is not sure how often she is getting the Lasix.  She denies any leg swelling.  She has a Foley in place without any issues.  I reviewed the discharge summary where patient was admitted from 1/2-1/12.  Patient had a lumbar spine compression fracture and orthopedics was consulted and patient underwent vertebral body augmentation with kyphoplasty.  Patient had issues with urinary retention so Foley was placed later the patient did have a little hyponatremia.  To note he does have a history of CHF with an EF greater than 55% with some diastolic dysfunction.  Patient is on some home Lasix.  At 1 point that they thought that the hyponatremia could be from SIADH and they recommended fluid restrictions 1.5 to 2/day  [facility] was sent in from [facility] due to low sodium of 125.  I reviewed  Physical Exam   Triage Vital Signs: ED Triage Vitals [12/26/21 1846]  Enc Vitals Group     BP 123/66     Pulse Rate 89     Resp 18     Temp (!) 97.5 F (36.4 C)     Temp Source Oral     SpO2 94 %     Weight 140 lb (63.5 kg)     Height 5' (1.524 m)     Head Circumference      [facility]      Pain Score 0     Pain Loc      Pain  Edu?      Excl. in [place]?     Most recent vital signs: Vitals:   12/26/21 1846  BP: 123/66  Pulse: 89  Resp: 18  Temp: (!) 97.5 F (36.4 C)  SpO2: 94%     General: Awake, no distress.  Pleasant female CV:  Good peripheral perfusion.  Resp:  Normal effort.  Clear lungs Abd:  No distention.  Soft nontender Other:  Able to lift up both legs able to move her bilateral arms.  Cranial nerves appear intact.  Alert and oriented x3.   ED Results / Procedures / Treatments   Labs (all labs ordered are listed, but only abnormal results are displayed) Labs Reviewed  CBC - Abnormal; Notable for the following components:      Result Value   RBC 3.76 (*)    Hemoglobin 10.2 (*)    HCT 31.6 (*)    RDW 16.1 (*)    Platelets 553 (*)    All other components within normal limits  BASIC METABOLIC PANEL - Abnormal; Notable for the following components:   Sodium 127 (*)    Chloride 91 (*)    Glucose, Bld 107 (*)  All other components within normal limits   PROCEDURES:  Critical Care performed: No  Procedures   MEDICATIONS ORDERED IN ED: Medications  sodium chloride 0.9 % bolus 500 mL (0 mLs Intravenous Stopped 12/27/21 0059)     IMPRESSION / MDM / ASSESSMENT AND PLAN / ED COURSE  I reviewed the triage vital signs and the nursing notes.  Patient with recent admission who comes in with concerns for hyponatremia Differential diagnosis includes, but is not limited to, hyponatremia most likely from SIADH versus overdiuresis versus decreased p.o. intake versus medications.  Patient is BMP shows slightly low sodium at 127 but when she was discharge it was 130.  Her chloride is also slightly low at 91.  Her CBC shows stable hemoglobin and her white count is normal.  However when I reviewed the discharge note it appears that when she was initially admitted it was 137 is been gradually downtrending over the past few days  Patient has no evidence of fluid overload on exam and I wonder if she  is being over diuresed.  I have given her 500 cc of fluids and her sodium went up to 129.  I considered admission for the hyponatremia but given it is only slightly lower than previous and patient is asymptomatic from it I feel that this continue to be managed from nursing facility.    I encouraged using Gatorade without sugar, Pedialyte to help with the sodium and doing the fluid restriction as recommended in the discharge summary.  They can also trial of holding her Lasix for 1 dose to see if that helps the sodium.  They continue to monitor at their.  If it is continuing to drop or she develops symptoms with it that she can return to the ER for consideration of admission at that time.     FINAL CLINICAL IMPRESSION(S) / ED DIAGNOSES   Final diagnoses:  Hyponatremia     Rx / DC Orders   ED Discharge Orders     None        Note:  This document was prepared using Dragon voice recognition software and may include unintentional dictation errors.   Vanessa Wahkon, MD 12/27/21 [phone]

## 2021-12-26 NOTE — ED Triage Notes (Signed)
[facility]. Paperwork shows sodium 125.  NAD noted.  Pt oriented to person only

## 2021-12-27 DIAGNOSIS — E871 Hypo-osmolality and hyponatremia: Secondary | ICD-10-CM | POA: Diagnosis not present

## 2021-12-27 LAB — BASIC METABOLIC PANEL
Anion gap: 6 (ref 5–15)
BUN: 11 mg/dL (ref 8–23)
CO2: 26 mmol/L (ref 22–32)
Calcium: 8.3 mg/dL — ABNORMAL LOW (ref 8.9–10.3)
Chloride: 97 mmol/L — ABNORMAL LOW (ref 98–111)
Creatinine, Ser: 0.82 mg/dL (ref 0.44–1.00)
GFR, Estimated: >60 mL/min (ref >60)
Glucose, Bld: 99 mg/dL (ref 70–99)
Potassium: 4 mmol/L (ref 3.5–5.1)
Sodium: 129 mmol/L — ABNORMAL LOW (ref 135–145)

## 2021-12-27 NOTE — Discharge Instructions (Addendum)
I gave her 500 cc of normal saline and her sodium went up to 129.  I wonder if she is being over diuresed.  She can could try to hold her Lasix for 1-2 doses however if she starts gaining significant weight away or shortness of breath they will need to be restarted   I encouraged using Gatorade without sugar, Pedialyte to help with the sodium    If it is continuing to drop or she develops symptoms with it that she can return to the ER for further workup.

## 2022-01-02 ENCOUNTER — Other Ambulatory Visit: Payer: Self-pay | Admitting: Geriatric Medicine

## 2022-01-02 DIAGNOSIS — R633 Feeding difficulties, unspecified: Secondary | ICD-10-CM

## 2022-01-02 DIAGNOSIS — R1312 Dysphagia, oropharyngeal phase: Secondary | ICD-10-CM

## 2022-01-06 ENCOUNTER — Ambulatory Visit: Payer: Medicare Other | Attending: Geriatric Medicine

## 2022-01-09 ENCOUNTER — Other Ambulatory Visit: Payer: Self-pay

## 2022-01-09 ENCOUNTER — Encounter: Payer: Self-pay | Admitting: Urology

## 2022-01-09 ENCOUNTER — Ambulatory Visit (INDEPENDENT_AMBULATORY_CARE_PROVIDER_SITE_OTHER): Payer: Medicare Other | Admitting: Urology

## 2022-01-09 ENCOUNTER — Ambulatory Visit: Payer: Self-pay

## 2022-01-09 VITALS — BP 107/66 | HR 81

## 2022-01-09 DIAGNOSIS — R339 Retention of urine, unspecified: Secondary | ICD-10-CM

## 2022-01-09 DIAGNOSIS — Z466 Encounter for fitting and adjustment of urinary device: Secondary | ICD-10-CM

## 2022-01-09 LAB — BLADDER SCAN AMB NON-IMAGING: Scan Result: 0

## 2022-01-09 MED ORDER — CIPROFLOXACIN HCL 500 MG PO TABS
500.0000 mg | ORAL_TABLET | Freq: Once | ORAL | Status: AC
  Administered 2022-01-09: 500 mg via ORAL

## 2022-01-09 NOTE — Progress Notes (Signed)
01/09/22 8:39 AM   Erin [name] 05-10-1946 [id number]  CC: Urinary retention  HPI: Frail and comorbid 76 year old female who was recently admitted from 12/08/2021-12/19/2021 for close compression fracture of L1 and underwent augmentation with kyphoplasty on 12/16/2021 by IR.  She developed urinary retention 12/12/2021 with a postvoid volume of 1500 mL urine.  She had a CT pelvis on 12/09/2021 that showed moderately distended bladder, but no suspicious lesions.  She denies any urinary symptoms prior to this hospitalization aside from mild stress incontinence, or any gross hematuria.  Denies prior episodes of retention.   PMH: Past Medical History:  Diagnosis Date   Anemia    h/o   Cancer ([place])    melanoma x 3the last one was of her leg and surgeon removed it   Colon polyp    Complication of anesthesia    PT STATED THAT WITH JOINT SURGERY THAT SHE WAS GIVEN SOME TYPE OF ANESTHESIA THAT MADE HER HALLUCINATE    Coronary artery calcification    Coronary artery disease    DDD (degenerative disc disease), lumbar    Diverticulosis 01/20/2017   Dysphagia    Dyspnea    with exertion-unable to walk a mile without getting sob- dr sparks set pt up to see Dr Rockey Situ after 11-19-16 surgery   Environmental allergies    Esophageal dysmotility    Fibrocystic breast disease    GERD (gastroesophageal reflux disease)    Heart murmur    asymptomatic   Hyperlipidemia    unspecified   Hypertension    Mitral regurgitation    Monilial esophagitis ([place]) 01/20/2017   Obstipation    Osteopenia    Peptic ulcer disease    Pulmonary fibrosis ([place])    per Dr Raul Del   RA (rheumatoid arthritis) ([place])    Raynaud's disease    Redundant colon 01/20/2017   Scleroderma ([place])    Telangiectasia of colon    Tubular adenoma of colon 01/20/2017   unspecified   Vulvar dysplasia     Surgical History: Past Surgical History:  Procedure Laterality Date   ABDOMINAL HYSTERECTOMY     BREAST CYST EXCISION Left 20+  years ago   No scar visible   BREAST LUMPECTOMY Right    CARDIAC CATHETERIZATION     CARPAL TUNNEL RELEASE Bilateral    COLON SURGERY     colon polyp    COLONOSCOPY  09/30/2011   tubular adenoma rtm   COLONOSCOPY     05/02/2003, 04/10/2000   COLONOSCOPY WITH PROPOFOL N/A 01/20/2017   Procedure: COLONOSCOPY WITH PROPOFOL;  Surgeon: Lollie Sails, MD;  Location: [facility] ENDOSCOPY;  Service: Endoscopy;  Laterality: N/A;   COLONOSCOPY WITH PROPOFOL N/A 09/17/2020   Procedure: COLONOSCOPY WITH PROPOFOL;  Surgeon: Lesly Rubenstein, MD;  Location: [facility] ENDOSCOPY;  Service: Endoscopy;  Laterality: N/A;   CORONARY ATHERECTOMY N/A 11/21/2020   Procedure: CORONARY ATHERECTOMY;  Surgeon: Wellington Hampshire, MD;  Location: [facility] CV LAB;  Service: Cardiovascular;  Laterality: N/A;   CORONARY STENT INTERVENTION  11/21/2020   CORONARY STENT INTERVENTION N/A 11/21/2020   Procedure: CORONARY STENT INTERVENTION;  Surgeon: Wellington Hampshire, MD;  Location: [facility] CV LAB;  Service: Cardiovascular;  Laterality: N/A;   ESOPHAGOGASTRODUODENOSCOPY     ESOPHAGOGASTRODUODENOSCOPY     09/30/2011, 04/10/2000 , no repeat rtm   ESOPHAGOGASTRODUODENOSCOPY N/A 03/02/2018   Procedure: ESOPHAGOGASTRODUODENOSCOPY (EGD);  Surgeon: Virgel Manifold, MD;  Location: [facility] ENDOSCOPY;  Service: Endoscopy;  Laterality: N/A;   ESOPHAGOGASTRODUODENOSCOPY (EGD) WITH PROPOFOL  N/A 01/20/2017   Procedure: ESOPHAGOGASTRODUODENOSCOPY (EGD) WITH PROPOFOL;  Surgeon: Lollie Sails, MD;  Location: [facility] ENDOSCOPY;  Service: Endoscopy;  Laterality: N/A;   ESOPHAGOGASTRODUODENOSCOPY (EGD) WITH PROPOFOL N/A 09/17/2020   Procedure: ESOPHAGOGASTRODUODENOSCOPY (EGD) WITH PROPOFOL;  Surgeon: Lesly Rubenstein, MD;  Location: [facility] ENDOSCOPY;  Service: Endoscopy;  Laterality: N/A;   EXCISION HYDRADENITIS LABIA Left 11/19/2016   Procedure: EXCISION LABIAL MASS;  Surgeon: Robert Bellow, MD;  Location: [facility] ORS;  Service: General;   Laterality: Left;   EYE SURGERY Bilateral    cataracts   HEEL SPUR EXCISION     HIP ARTHROPLASTY Left 02/23/2018   Procedure: ARTHROPLASTY BIPOLAR HIP (HEMIARTHROPLASTY);  Surgeon: Thornton Park, MD;  Location: [facility] ORS;  Service: Orthopedics;  Laterality: Left;   IR KYPHO LUMBAR INC FX REDUCE BONE BX UNI/BIL CANNULATION INC/IMAGING  12/16/2021   KYPHOPLASTY N/A 04/26/2019   Procedure: KYPHOPLASTY T7;  Surgeon: Hessie Knows, MD;  Location: [facility] ORS;  Service: Orthopedics;  Laterality: N/A;   KYPHOPLASTY N/A 05/17/2019   Procedure: KYPHOPLASTY T8;  Surgeon: Hessie Knows, MD;  Location: [facility] ORS;  Service: Orthopedics;  Laterality: N/A;   KYPHOPLASTY N/A 07/26/2019   Procedure: T-9 KYPHOPLASTY;  Surgeon: Hessie Knows, MD;  Location: [facility] ORS;  Service: Orthopedics;  Laterality: N/A;   MASS EXCISION Left 11/19/2016   Procedure: EXCISION LEFT THIGH MELANOMA;  Surgeon: Robert Bellow, MD;  Location: [facility] ORS;  Service: General;  Laterality: Left;   RIGHT/LEFT HEART CATH AND CORONARY ANGIOGRAPHY N/A 11/15/2020   Procedure: RIGHT/LEFT HEART CATH AND CORONARY ANGIOGRAPHY poss intervention;  Surgeon: Minna Merritts, MD;  Location: [place] CV LAB;  Service: Cardiovascular;  Laterality: N/A;   SENTINEL NODE BIOPSY Left 11/19/2016   Procedure: INGUINAL SENTINEL NODE BIOPSY;  Surgeon: Robert Bellow, MD;  Location: [facility] ORS;  Service: General;  Laterality: Left;   UPPER ESOPHAGEAL ENDOSCOPIC ULTRASOUND (EUS) N/A 01/29/2017   Procedure: UPPER ESOPHAGEAL ENDOSCOPIC ULTRASOUND (EUS);  Surgeon: Holly Bodily, MD;  Location: [facility] ENDOSCOPY;  Service: Endoscopy;  Laterality: N/A;    Family History: Family History  Problem Relation Age of Onset   Endometrial cancer Mother 38   Hypertension Mother    Osteoporosis Mother    Bladder Cancer Father    COPD Father    Cerebral palsy Sister     Social History:  reports that she has never smoked. She has never used smokeless tobacco. She  reports that she does not drink alcohol and does not use drugs.  Physical Exam: BP 107/66 (BP Location: Left Arm, Patient Position: Sitting, Cuff Size: Normal)    Pulse 81    LMP  (LMP Unknown)    Constitutional: In wheelchair, frail appearing Cardiovascular: No clubbing, cyanosis, or edema. Respiratory: Normal respiratory effort, no increased work of breathing.   Laboratory Data: Reviewed  Pertinent Imaging: I have personally viewed and interpreted the CT pelvis dated 12/09/2021 showing a moderately distended bladder but no other abnormalities.  Assessment & Plan:   Very frail 76 year old female who developed urinary retention after hospitalization for compression fracture of L1.  CT pelvis was normal aside from moderately distended bladder, and she denies any significant urinary symptoms prior to this hospitalization.  Keflex was given for prophylaxis, and Foley was removed today.  She has been able to void during the day today, and return this afternoon for PVR of 0 mL.  Return precautions discussed, can follow-up with urology as needed    Nickolas Madrid, MD 01/09/2022  [facility] [address] [place], [place] [address] [phone]

## 2022-01-09 NOTE — Progress Notes (Signed)
Catheter Removal  Patient is present today for a catheter removal. 22ml of water was drained from the balloon. A 16FR coude foley cath was removed from the bladder no complications were noted. Patient tolerated well.  Performed by: Gordy Clement, CMA   Follow up/ Additional notes: RTC this afternoon for PVR

## 2022-01-10 ENCOUNTER — Telehealth: Payer: Self-pay | Admitting: Urology

## 2022-01-10 NOTE — Telephone Encounter (Signed)
Kim with [facility] called and stated that Ms. Caetano was unable to void the night of 01/09/22 and a cath was inserted.  She stated that Ms. Hord was moved to [place] on 01/10/22.

## 2022-01-12 ENCOUNTER — Inpatient Hospital Stay: Payer: Medicare Other

## 2022-01-12 ENCOUNTER — Encounter: Payer: Self-pay | Admitting: Radiology

## 2022-01-12 ENCOUNTER — Emergency Department: Payer: Medicare Other

## 2022-01-12 ENCOUNTER — Inpatient Hospital Stay
Admission: EM | Admit: 2022-01-12 | Discharge: 2022-01-17 | DRG: 871 | Disposition: A | Discharge status: Skilled Nursing Facility | Payer: Medicare Other | Attending: Internal Medicine | Admitting: Internal Medicine

## 2022-01-12 ENCOUNTER — Other Ambulatory Visit: Payer: Self-pay

## 2022-01-12 DIAGNOSIS — J9600 Acute respiratory failure, unspecified whether with hypoxia or hypercapnia: Secondary | ICD-10-CM | POA: Diagnosis present

## 2022-01-12 DIAGNOSIS — F419 Anxiety disorder, unspecified: Secondary | ICD-10-CM | POA: Diagnosis present

## 2022-01-12 DIAGNOSIS — E871 Hypo-osmolality and hyponatremia: Secondary | ICD-10-CM | POA: Diagnosis present

## 2022-01-12 DIAGNOSIS — I2510 Atherosclerotic heart disease of native coronary artery without angina pectoris: Secondary | ICD-10-CM

## 2022-01-12 DIAGNOSIS — N390 Urinary tract infection, site not specified: Secondary | ICD-10-CM | POA: Diagnosis present

## 2022-01-12 DIAGNOSIS — Z7982 Long term (current) use of aspirin: Secondary | ICD-10-CM

## 2022-01-12 DIAGNOSIS — R52 Pain, unspecified: Secondary | ICD-10-CM

## 2022-01-12 DIAGNOSIS — D649 Anemia, unspecified: Secondary | ICD-10-CM | POA: Diagnosis present

## 2022-01-12 DIAGNOSIS — I248 Other forms of acute ischemic heart disease: Secondary | ICD-10-CM | POA: Diagnosis present

## 2022-01-12 DIAGNOSIS — J8410 Pulmonary fibrosis, unspecified: Secondary | ICD-10-CM | POA: Diagnosis present

## 2022-01-12 DIAGNOSIS — R54 Age-related physical debility: Secondary | ICD-10-CM | POA: Diagnosis present

## 2022-01-12 DIAGNOSIS — I7300 Raynaud's syndrome without gangrene: Secondary | ICD-10-CM | POA: Diagnosis present

## 2022-01-12 DIAGNOSIS — K219 Gastro-esophageal reflux disease without esophagitis: Secondary | ICD-10-CM | POA: Diagnosis present

## 2022-01-12 DIAGNOSIS — Z7189 Other specified counseling: Secondary | ICD-10-CM | POA: Diagnosis not present

## 2022-01-12 DIAGNOSIS — Z8711 Personal history of peptic ulcer disease: Secondary | ICD-10-CM

## 2022-01-12 DIAGNOSIS — J9601 Acute respiratory failure with hypoxia: Secondary | ICD-10-CM | POA: Diagnosis present

## 2022-01-12 DIAGNOSIS — Z515 Encounter for palliative care: Secondary | ICD-10-CM | POA: Diagnosis not present

## 2022-01-12 DIAGNOSIS — A419 Sepsis, unspecified organism: Secondary | ICD-10-CM | POA: Diagnosis present

## 2022-01-12 DIAGNOSIS — R64 Cachexia: Secondary | ICD-10-CM | POA: Diagnosis present

## 2022-01-12 DIAGNOSIS — F32A Depression, unspecified: Secondary | ICD-10-CM | POA: Diagnosis present

## 2022-01-12 DIAGNOSIS — J84112 Idiopathic pulmonary fibrosis: Secondary | ICD-10-CM | POA: Diagnosis not present

## 2022-01-12 DIAGNOSIS — D75839 Thrombocytosis, unspecified: Secondary | ICD-10-CM | POA: Diagnosis present

## 2022-01-12 DIAGNOSIS — I5032 Chronic diastolic (congestive) heart failure: Secondary | ICD-10-CM | POA: Diagnosis present

## 2022-01-12 DIAGNOSIS — I340 Nonrheumatic mitral (valve) insufficiency: Secondary | ICD-10-CM | POA: Diagnosis present

## 2022-01-12 DIAGNOSIS — Z7902 Long term (current) use of antithrombotics/antiplatelets: Secondary | ICD-10-CM

## 2022-01-12 DIAGNOSIS — Z79899 Other long term (current) drug therapy: Secondary | ICD-10-CM

## 2022-01-12 DIAGNOSIS — Z66 Do not resuscitate: Secondary | ICD-10-CM | POA: Diagnosis present

## 2022-01-12 DIAGNOSIS — Z955 Presence of coronary angioplasty implant and graft: Secondary | ICD-10-CM | POA: Diagnosis not present

## 2022-01-12 DIAGNOSIS — I110 Hypertensive heart disease with heart failure: Secondary | ICD-10-CM | POA: Diagnosis present

## 2022-01-12 DIAGNOSIS — Z8249 Family history of ischemic heart disease and other diseases of the circulatory system: Secondary | ICD-10-CM

## 2022-01-12 DIAGNOSIS — G9341 Metabolic encephalopathy: Secondary | ICD-10-CM | POA: Diagnosis present

## 2022-01-12 DIAGNOSIS — M069 Rheumatoid arthritis, unspecified: Secondary | ICD-10-CM | POA: Diagnosis present

## 2022-01-12 DIAGNOSIS — R6520 Severe sepsis without septic shock: Secondary | ICD-10-CM

## 2022-01-12 DIAGNOSIS — Z20822 Contact with and (suspected) exposure to covid-19: Secondary | ICD-10-CM | POA: Diagnosis present

## 2022-01-12 DIAGNOSIS — Z888 Allergy status to other drugs, medicaments and biological substances status: Secondary | ICD-10-CM

## 2022-01-12 DIAGNOSIS — M3481 Systemic sclerosis with lung involvement: Secondary | ICD-10-CM | POA: Diagnosis present

## 2022-01-12 DIAGNOSIS — Z6827 Body mass index (BMI) 27.0-27.9, adult: Secondary | ICD-10-CM

## 2022-01-12 DIAGNOSIS — J189 Pneumonia, unspecified organism: Secondary | ICD-10-CM | POA: Diagnosis present

## 2022-01-12 DIAGNOSIS — E785 Hyperlipidemia, unspecified: Secondary | ICD-10-CM | POA: Diagnosis present

## 2022-01-12 DIAGNOSIS — G8929 Other chronic pain: Secondary | ICD-10-CM | POA: Diagnosis present

## 2022-01-12 DIAGNOSIS — Z96642 Presence of left artificial hip joint: Secondary | ICD-10-CM | POA: Diagnosis present

## 2022-01-12 DIAGNOSIS — Z882 Allergy status to sulfonamides status: Secondary | ICD-10-CM

## 2022-01-12 DIAGNOSIS — R339 Retention of urine, unspecified: Secondary | ICD-10-CM | POA: Diagnosis present

## 2022-01-12 DIAGNOSIS — E861 Hypovolemia: Secondary | ICD-10-CM | POA: Diagnosis present

## 2022-01-12 DIAGNOSIS — Z85820 Personal history of malignant melanoma of skin: Secondary | ICD-10-CM

## 2022-01-12 DIAGNOSIS — F418 Other specified anxiety disorders: Secondary | ICD-10-CM | POA: Diagnosis present

## 2022-01-12 DIAGNOSIS — J9811 Atelectasis: Secondary | ICD-10-CM

## 2022-01-12 LAB — URINALYSIS, COMPLETE (UACMP) WITH MICROSCOPIC
Glucose, UA: NEGATIVE mg/dL
Nitrite: NEGATIVE
Protein, ur: 30 mg/dL — AB
Specific Gravity, Urine: >1.03 — ABNORMAL HIGH (ref 1.005–1.030)
pH: 5.5 (ref 5.0–8.0)

## 2022-01-12 LAB — COMPREHENSIVE METABOLIC PANEL
ALT: 21 U/L (ref 0–44)
AST: 34 U/L (ref 15–41)
Albumin: 2.4 g/dL — ABNORMAL LOW (ref 3.5–5.0)
Alkaline Phosphatase: 88 U/L (ref 38–126)
Anion gap: 7 (ref 5–15)
BUN: 14 mg/dL (ref 8–23)
CO2: 23 mmol/L (ref 22–32)
Calcium: 8 mg/dL — ABNORMAL LOW (ref 8.9–10.3)
Chloride: 99 mmol/L (ref 98–111)
Creatinine, Ser: 1.05 mg/dL — ABNORMAL HIGH (ref 0.44–1.00)
GFR, Estimated: 55 mL/min — ABNORMAL LOW (ref >60)
Glucose, Bld: 88 mg/dL (ref 70–99)
Potassium: 4.1 mmol/L (ref 3.5–5.1)
Sodium: 129 mmol/L — ABNORMAL LOW (ref 135–145)
Total Bilirubin: 0.5 mg/dL (ref 0.3–1.2)
Total Protein: 5.6 g/dL — ABNORMAL LOW (ref 6.5–8.1)

## 2022-01-12 LAB — BLOOD GAS, ARTERIAL
Acid-base deficit: 2.8 mmol/L — ABNORMAL HIGH (ref 0.0–2.0)
Bicarbonate: 21.7 mmol/L (ref 20.0–28.0)
FIO2: 0.28
O2 Saturation: 98.6 %
Patient temperature: 37
pCO2 arterial: 35 mmHg (ref 32.0–48.0)
pH, Arterial: 7.4 (ref 7.350–7.450)
pO2, Arterial: 119 mmHg — ABNORMAL HIGH (ref 83.0–108.0)

## 2022-01-12 LAB — CBC WITH DIFFERENTIAL/PLATELET
Abs Immature Granulocytes: 0.21 10*3/uL — ABNORMAL HIGH (ref 0.00–0.07)
Basophils Absolute: 0.1 10*3/uL (ref 0.0–0.1)
Basophils Relative: 1 %
Eosinophils Absolute: 0 10*3/uL (ref 0.0–0.5)
Eosinophils Relative: 0 %
HCT: 24.6 % — ABNORMAL LOW (ref 36.0–46.0)
Hemoglobin: 7.7 g/dL — ABNORMAL LOW (ref 12.0–15.0)
Immature Granulocytes: 1 %
Lymphocytes Relative: 7 %
Lymphs Abs: 1.1 10*3/uL (ref 0.7–4.0)
MCH: 26 pg (ref 26.0–34.0)
MCHC: 31.3 g/dL (ref 30.0–36.0)
MCV: 83.1 fL (ref 80.0–100.0)
Monocytes Absolute: 1.7 10*3/uL — ABNORMAL HIGH (ref 0.1–1.0)
Monocytes Relative: 11 %
Neutro Abs: 12.5 10*3/uL — ABNORMAL HIGH (ref 1.7–7.7)
Neutrophils Relative %: 80 %
Platelets: 731 10*3/uL — ABNORMAL HIGH (ref 150–400)
RBC: 2.96 MIL/uL — ABNORMAL LOW (ref 3.87–5.11)
RDW: 16.4 % — ABNORMAL HIGH (ref 11.5–15.5)
WBC: 15.5 10*3/uL — ABNORMAL HIGH (ref 4.0–10.5)
nRBC: 0 % (ref 0.0–0.2)

## 2022-01-12 LAB — LACTIC ACID, PLASMA
Lactic Acid, Venous: 1.3 mmol/L (ref 0.5–1.9)
Lactic Acid, Venous: 0.9 mmol/L (ref 0.5–1.9)

## 2022-01-12 LAB — RESP PANEL BY RT-PCR (FLU A&B, COVID) ARPGX2
Influenza A by PCR: NEGATIVE
Influenza B by PCR: NEGATIVE
SARS Coronavirus 2 by RT PCR: NEGATIVE

## 2022-01-12 LAB — APTT: aPTT: 34 seconds (ref 24–36)

## 2022-01-12 LAB — PROTIME-INR
INR: 1.2 (ref 0.8–1.2)
Prothrombin Time: 14.9 seconds (ref 11.4–15.2)

## 2022-01-12 LAB — PROCALCITONIN: Procalcitonin: 0.11 ng/mL

## 2022-01-12 MED ORDER — ACETAMINOPHEN 500 MG PO TABS
1000.0000 mg | ORAL_TABLET | Freq: Once | ORAL | Status: AC
  Administered 2022-01-12: 1000 mg via ORAL
  Filled 2022-01-12: qty 2

## 2022-01-12 MED ORDER — LACTATED RINGERS IV BOLUS (SEPSIS)
1000.0000 mL | Freq: Once | INTRAVENOUS | Status: AC
  Administered 2022-01-12: 1000 mL via INTRAVENOUS

## 2022-01-12 MED ORDER — SODIUM CHLORIDE 0.9 % IV SOLN
2.0000 g | Freq: Once | INTRAVENOUS | Status: AC
  Administered 2022-01-12: 2 g via INTRAVENOUS
  Filled 2022-01-12: qty 2

## 2022-01-12 MED ORDER — METRONIDAZOLE 500 MG/100ML IV SOLN
500.0000 mg | Freq: Once | INTRAVENOUS | Status: AC
  Administered 2022-01-12: 500 mg via INTRAVENOUS
  Filled 2022-01-12: qty 100

## 2022-01-12 MED ORDER — POLYSACCHARIDE IRON COMPLEX 150 MG PO CAPS
150.0000 mg | ORAL_CAPSULE | Freq: Every day | ORAL | Status: DC
  Administered 2022-01-13 – 2022-01-17 (×5): 150 mg via ORAL
  Filled 2022-01-12 (×5): qty 1

## 2022-01-12 MED ORDER — VANCOMYCIN HCL IN DEXTROSE 1-5 GM/200ML-% IV SOLN: 1000.0000 mg | Freq: Once | INTRAVENOUS | Status: DC

## 2022-01-12 MED ORDER — SODIUM CHLORIDE 0.9 % IV SOLN
2.0000 g | INTRAVENOUS | Status: DC
  Administered 2022-01-13: 2 g via INTRAVENOUS
  Filled 2022-01-12 (×2): qty 20

## 2022-01-12 MED ORDER — DULOXETINE HCL 30 MG PO CPEP
60.0000 mg | ORAL_CAPSULE | Freq: Every day | ORAL | Status: DC
  Administered 2022-01-13 – 2022-01-17 (×5): 60 mg via ORAL
  Filled 2022-01-12 (×5): qty 2

## 2022-01-12 MED ORDER — BUDESONIDE 0.5 MG/2ML IN SUSP
0.5000 mg | Freq: Two times a day (BID) | RESPIRATORY_TRACT | Status: DC
  Administered 2022-01-12 – 2022-01-17 (×9): 0.5 mg via RESPIRATORY_TRACT
  Filled 2022-01-12 (×10): qty 2

## 2022-01-12 MED ORDER — IPRATROPIUM-ALBUTEROL 0.5-2.5 (3) MG/3ML IN SOLN
3.0000 mL | Freq: Four times a day (QID) | RESPIRATORY_TRACT | Status: DC | PRN
  Administered 2022-01-14: 3 mL via RESPIRATORY_TRACT
  Filled 2022-01-12 (×2): qty 3

## 2022-01-12 MED ORDER — ALPRAZOLAM 0.5 MG PO TABS: 0.5000 mg | ORAL_TABLET | Freq: Two times a day (BID) | ORAL | Status: DC | PRN

## 2022-01-12 MED ORDER — LATANOPROST 0.005 % OP SOLN
1.0000 [drp] | Freq: Every day | OPHTHALMIC | Status: DC
  Administered 2022-01-12 – 2022-01-16 (×4): 1 [drp] via OPHTHALMIC
  Filled 2022-01-12: qty 2.5

## 2022-01-12 MED ORDER — ONDANSETRON HCL 4 MG PO TABS: 4.0000 mg | ORAL_TABLET | Freq: Four times a day (QID) | ORAL | Status: DC | PRN

## 2022-01-12 MED ORDER — SODIUM CHLORIDE 0.9 % IV SOLN
500.0000 mg | INTRAVENOUS | Status: DC
  Administered 2022-01-12: 500 mg via INTRAVENOUS
  Filled 2022-01-12 (×3): qty 5

## 2022-01-12 MED ORDER — CALCITONIN (SALMON) 200 UNIT/ACT NA SOLN
1.0000 | Freq: Every day | NASAL | Status: DC
  Administered 2022-01-13 – 2022-01-17 (×4): 1 via NASAL
  Filled 2022-01-12: qty 3.7

## 2022-01-12 MED ORDER — ONDANSETRON HCL 4 MG/2ML IJ SOLN
4.0000 mg | Freq: Four times a day (QID) | INTRAMUSCULAR | Status: DC | PRN
  Administered 2022-01-14: 4 mg via INTRAVENOUS
  Filled 2022-01-12 (×2): qty 2

## 2022-01-12 MED ORDER — ACETAMINOPHEN 325 MG PO TABS
650.0000 mg | ORAL_TABLET | Freq: Four times a day (QID) | ORAL | Status: DC | PRN
  Administered 2022-01-14: 650 mg via ORAL
  Filled 2022-01-12: qty 2

## 2022-01-12 MED ORDER — POLYETHYLENE GLYCOL 3350 17 G PO PACK: 17.0000 g | PACK | Freq: Every day | ORAL | Status: DC | PRN

## 2022-01-12 MED ORDER — ADULT MULTIVITAMIN W/MINERALS CH
1.0000 | ORAL_TABLET | Freq: Every day | ORAL | Status: DC
  Administered 2022-01-13 – 2022-01-17 (×5): 1 via ORAL
  Filled 2022-01-12 (×5): qty 1

## 2022-01-12 MED ORDER — VANCOMYCIN HCL 1250 MG/250ML IV SOLN
1250.0000 mg | Freq: Once | INTRAVENOUS | Status: AC
  Administered 2022-01-12: 1250 mg via INTRAVENOUS
  Filled 2022-01-12: qty 250

## 2022-01-12 MED ORDER — NITROGLYCERIN 0.4 MG SL SUBL: 0.4000 mg | SUBLINGUAL_TABLET | SUBLINGUAL | Status: DC | PRN

## 2022-01-12 MED ORDER — ATORVASTATIN CALCIUM 20 MG PO TABS
40.0000 mg | ORAL_TABLET | Freq: Every day | ORAL | Status: DC
Start: 2022-01-12 — End: 2022-01-17
  Administered 2022-01-12 – 2022-01-16 (×5): 40 mg via ORAL
  Filled 2022-01-12 (×5): qty 2

## 2022-01-12 MED ORDER — EZETIMIBE 10 MG PO TABS
10.0000 mg | ORAL_TABLET | Freq: Every day | ORAL | Status: DC
  Administered 2022-01-12 – 2022-01-16 (×5): 10 mg via ORAL
  Filled 2022-01-12 (×7): qty 1

## 2022-01-12 MED ORDER — LACTATED RINGERS IV SOLN
INTRAVENOUS | Status: AC
  Administered 2022-01-12: 100 mL via INTRAVENOUS

## 2022-01-12 MED ORDER — HYDROXYCHLOROQUINE SULFATE 200 MG PO TABS
200.0000 mg | ORAL_TABLET | Freq: Two times a day (BID) | ORAL | Status: DC
  Administered 2022-01-12 – 2022-01-17 (×10): 200 mg via ORAL
  Filled 2022-01-12 (×11): qty 1

## 2022-01-12 NOTE — ED Notes (Signed)
Informed RN bed assigned 

## 2022-01-12 NOTE — ED Notes (Signed)
Unable to obtain second set of cultures. ABX started as to not delay care.

## 2022-01-12 NOTE — Progress Notes (Signed)
PHARMACY -  BRIEF ANTIBIOTIC NOTE   Pharmacy has received consult(s) for cefepime and vancomycin from an ED provider.  The patient's profile has been reviewed for ht/wt/allergies/indication/available labs.    One time order(s) placed for   1) cefepime 2 grams IV x 1  2) vancomycin 1250 mg x 1  Further antibiotics/pharmacy consults should be ordered by admitting physician if indicated.                       Thank you, Dallie Piles 01/12/2022  12:00 PM

## 2022-01-12 NOTE — ED Triage Notes (Addendum)
[facility] from [facility] with complaints of fever and low O2. EMS reports that staff wasn't able to get her O2 out of the 80's and her temp was elevated starting today. Pt reports that she doesn't normally wear oxygen. Pt on 6L with an O2 saturation of 93%.

## 2022-01-12 NOTE — Plan of Care (Signed)

## 2022-01-12 NOTE — ED Provider Notes (Signed)
[facility] Provider Note    Event Date/Time   First MD Initiated Contact with Patient 01/12/22 1143     (approximate)   History   Fever   HPI  Erin [name] is a 76 y.o. female with history of [name], hypertension, frequent UTI and as listed in EMR presents to the emergency department for treatment and evaluation of decreased oxygen saturation and fever today. She is a resident at [facility]. Staff report her oxygen level has been in the 80s today. She is not on home O2. Patient denies complaint except chronic right knee pain. Marland Kitchen      Physical Exam   Triage Vital Signs: ED Triage Vitals  Enc Vitals Group     BP --      Pulse --      Resp --      Temp --      Temp src --      SpO2 01/12/22 1133 93 %     Weight 01/12/22 1132 140 lb (63.5 kg)     Height 01/12/22 1132 5' (1.524 m)     Head Circumference --      Peak Flow --      Pain Score --      Pain Loc --      Pain Edu? --      Excl. in [place]? --     Most recent vital signs: Vitals:   01/12/22 1133 01/12/22 1230  BP:  (!) 119/51  Pulse:  82  Resp:  14  Temp:  (!) 101.7 F (38.7 C)  SpO2: 93% 98%    General: Awake, no distress. CV:  Good peripheral perfusion.  Resp:  Normal effort. Tachypnea. Abd:  No distention. No tenderness to palpation.  Other:  Erythema and mild edema of left lower extremity and ankle. No wound or lesion.   Alert, oriented to person and place.   ED Results / Procedures / Treatments   Labs (all labs ordered are listed, but only abnormal results are displayed) Labs Reviewed  COMPREHENSIVE METABOLIC PANEL - Abnormal; Notable for the following components:      Result Value   Sodium 129 (*)    Creatinine, Ser 1.05 (*)    Calcium 8.0 (*)    Total Protein 5.6 (*)    Albumin 2.4 (*)    GFR, Estimated 55 (*)    All other components within normal limits  CBC WITH DIFFERENTIAL/PLATELET - Abnormal; Notable for the following components:   WBC 15.5 (*)    RBC 2.96  (*)    Hemoglobin 7.7 (*)    HCT 24.6 (*)    RDW 16.4 (*)    Platelets 731 (*)    Neutro Abs 12.5 (*)    Monocytes Absolute 1.7 (*)    Abs Immature Granulocytes 0.21 (*)    All other components within normal limits  URINALYSIS, COMPLETE (UACMP) WITH MICROSCOPIC - Abnormal; Notable for the following components:   Specific Gravity, Urine >1.030 (*)    Hgb urine dipstick TRACE (*)    Bilirubin Urine SMALL (*)    Ketones, ur TRACE (*)    Protein, ur 30 (*)    Leukocytes,Ua SMALL (*)    Bacteria, UA RARE (*)    All other components within normal limits  RESP PANEL BY RT-PCR (FLU A&B, COVID) ARPGX2  CULTURE, BLOOD (ROUTINE X 2)  CULTURE, BLOOD (ROUTINE X 2)  URINE CULTURE  LACTIC ACID, PLASMA  PROTIME-INR  APTT  LACTIC ACID, PLASMA  PROCALCITONIN     EKG  Not indicated.   RADIOLOGY  Image and radiology report reviewed by me.  Chest x-ray consistent with bilateral pneumonia.  PROCEDURES:  Critical Care performed: Yes, see critical care procedure note(s)  Procedures  CRITICAL CARE Performed by: Sherrie George   Total critical care time: 45 minutes  Critical care time was exclusive of separately billable procedures and treating other patients.  Critical care was necessary to treat or prevent imminent or life-threatening deterioration.  Critical care was time spent personally by me on the following activities: development of treatment plan with patient and/or surrogate as well as nursing, discussions with consultants, evaluation of patient's response to treatment, examination of patient, obtaining history from patient or surrogate, ordering and performing treatments and interventions, ordering and review of laboratory studies, ordering and review of radiographic studies, pulse oximetry and re-evaluation of patient's condition.   MEDICATIONS ORDERED IN ED: Medications  lactated ringers infusion (has no administration in time range)  vancomycin (VANCOREADY) IVPB 1250  mg/250 mL (1,250 mg Intravenous New Bag/Given 01/12/22 1318)  lactated ringers bolus 1,000 mL (1,000 mLs Intravenous New Bag/Given 01/12/22 1222)    And  lactated ringers bolus 1,000 mL (1,000 mLs Intravenous New Bag/Given 01/12/22 1223)  ceFEPIme (MAXIPIME) 2 g in sodium chloride 0.9 % 100 mL IVPB (0 g Intravenous Stopped 01/12/22 1300)  metroNIDAZOLE (FLAGYL) IVPB 500 mg (500 mg Intravenous New Bag/Given 01/12/22 1259)  acetaminophen (TYLENOL) tablet 1,000 mg (1,000 mg Oral Given 01/12/22 1319)     IMPRESSION / MDM / ASSESSMENT AND PLAN / ED COURSE   I have reviewed the triage note.  Differential diagnosis includes, but is not limited to, sepsis, COVID, influenza, uti  The patient is on cardiac and respiratory monitor for evaluation of acute changes.   76 year old female presents to the ER for evaluation of fever and decreased oxygen saturation. See HPI. She is currently not on oxygen and is 93% on room air.  Sepsis protocol initiated.  Placed on 2 liters [place] by Dr. Jacqualine Code during his assessment.  IV fluids and antibiotics infusing. Bedside monitor shows heart rate of 82, sinus rhythm.  Family at bedside. They report that she had her foley removed on Thursday when she followed up with urology, but later that night was unable to urinate and it was reinserted. Outpatient urology note reviewed. She was placed on Keflex empirically but otherwise to follow up as needed.    Labs show persistent hyponatremia with a sodium of 129.  Lactic acid is 1.3.  CBC shows white blood cell count of 15.5 with a 12.5% neutrophils.  Hemoglobin today is 7.7 in comparison to 10.2 2 weeks ago.  Will check Hemoccult.  Admission plan discussed with the patient and family who were advised of test results.  Hemoccult is negative.  Dr. Francine Graven has accepted patient for admission.  Increased work of breathing yet saturation is 99% on 2 liters. Fluids are complete. She also just had po Tylenol. Dr. Francine Graven at bedside. Repeat chest  x-ray ordered.    FINAL CLINICAL IMPRESSION(S) / ED DIAGNOSES   Final diagnoses:  Sepsis, due to unspecified organism, unspecified whether acute organ dysfunction present [facility])     Rx / DC Orders   ED Discharge Orders     None        Note:  This document was prepared using Dragon voice recognition software and may include unintentional dictation errors.   Victorino Dike, FNP 01/12/22  [place], MD 01/13/22 [phone]

## 2022-01-12 NOTE — H&P (Signed)
History and Physical    Patient: [name] KZS:[id number] DOB: 1946-02-10 DOA: 01/12/2022 DOS: the patient was seen and examined on 01/12/2022 PCP: Idelle Crouch, MD  Patient coming from:  [facility] assisted living facility  Chief Complaint:  Chief Complaint  Patient presents with   Fever   Most of the history was obtained from patient's son at the bedside.  Patient unable to provide any history due to mental status  HPI: Erin [name] is a 76 y.o. female with medical history significant for hypertension, coronary artery disease, GERD who was sent from the assisted living facility where she currently resides to the ER for evaluation of fever and hypoxia. Patient was noted to have a Tmax of 100.1 at the skilled nursing facility and room air pulse oximetry was in the 80s.  She was placed on 6 L of oxygen with improvement in her pulse oximetry to 93%. She is oriented to person and place but at baseline is usually awake, alert and oriented x3. Patient's son states that she was recently hospitalized following a fall and at that time was found to have an L1 compression fracture status post kyphoplasty by interventional radiology.  Her hospital course was complicated by urinary retention and she was discharged to subacute rehab with a Foley catheter. Foley catheter was removed on 01/09/22 by urology but had to be replaced same day because of voiding difficulty. Patient has been on Keflex prescribed by urology for suspected UTI. She received broad-spectrum antibiotics, Flagyl, vancomycin and cefepime in the ER for sepsis from multifocal pneumonia  Review of Systems: unable to review all systems due to the inability of the patient to answer questions. Past Medical History:  Diagnosis Date   Anemia    h/o   Cancer ([place])    melanoma x 3the last one was of her leg and surgeon removed it   Colon polyp    Complication of anesthesia    PT STATED THAT WITH JOINT SURGERY THAT SHE WAS GIVEN SOME  TYPE OF ANESTHESIA THAT MADE HER HALLUCINATE    Coronary artery calcification    Coronary artery disease    DDD (degenerative disc disease), lumbar    Diverticulosis 01/20/2017   Dysphagia    Dyspnea    with exertion-unable to walk a mile without getting sob- dr sparks set pt up to see Dr Rockey Situ after 11-19-16 surgery   Environmental allergies    Esophageal dysmotility    Fibrocystic breast disease    GERD (gastroesophageal reflux disease)    Heart murmur    asymptomatic   Hyperlipidemia    unspecified   Hypertension    Mitral regurgitation    Monilial esophagitis ([place]) 01/20/2017   Obstipation    Osteopenia    Peptic ulcer disease    Pulmonary fibrosis ([place])    per Dr Raul Del   RA (rheumatoid arthritis) ([place])    Raynaud's disease    Redundant colon 01/20/2017   Scleroderma ([place])    Telangiectasia of colon    Tubular adenoma of colon 01/20/2017   unspecified   Vulvar dysplasia    Past Surgical History:  Procedure Laterality Date   ABDOMINAL HYSTERECTOMY     BREAST CYST EXCISION Left 20+ years ago   No scar visible   BREAST LUMPECTOMY Right    CARDIAC CATHETERIZATION     CARPAL TUNNEL RELEASE Bilateral    COLON SURGERY     colon polyp    COLONOSCOPY  09/30/2011   tubular adenoma rtm  COLONOSCOPY     05/02/2003, 04/10/2000   COLONOSCOPY WITH PROPOFOL N/A 01/20/2017   Procedure: COLONOSCOPY WITH PROPOFOL;  Surgeon: Lollie Sails, MD;  Location[facility] ENDOSCOPY;  Service: Endoscopy;  Laterality: N/A;   COLONOSCOPY WITH PROPOFOL N/A 09/17/2020   Procedure: COLONOSCOPY WITH PROPOFOL;  Surgeon: Lesly Rubenstein, MD;  Location: [facility] ENDOSCOPY;  Service: Endoscopy;  Laterality: N/A;   CORONARY ATHERECTOMY N/A 11/21/2020   Procedure: CORONARY ATHERECTOMY;  Surgeon: Wellington Hampshire, MD;  Location: [place] CV LAB;  Service: Cardiovascular;  Laterality: N/A;   CORONARY STENT INTERVENTION  11/21/2020   CORONARY STENT INTERVENTION N/A 11/21/2020   Procedure: CORONARY  STENT INTERVENTION;  Surgeon: Wellington Hampshire, MD;  Location: [place] CV LAB;  Service: Cardiovascular;  Laterality: N/A;   ESOPHAGOGASTRODUODENOSCOPY     ESOPHAGOGASTRODUODENOSCOPY     09/30/2011, 04/10/2000 , no repeat rtm   ESOPHAGOGASTRODUODENOSCOPY N/A 03/02/2018   Procedure: ESOPHAGOGASTRODUODENOSCOPY (EGD);  Surgeon: Virgel Manifold, MD;  Location: [facility] ENDOSCOPY;  Service: Endoscopy;  Laterality: N/A;   ESOPHAGOGASTRODUODENOSCOPY (EGD) WITH PROPOFOL N/A 01/20/2017   Procedure: ESOPHAGOGASTRODUODENOSCOPY (EGD) WITH PROPOFOL;  Surgeon: Lollie Sails, MD;  Location[facility] END[facility]COPY;  Service: Endoscopy;  Laterality: N/A;   ESOPHAGOGASTRODUODENOSCOPY (EGD) WITH PROPOFOL N/A 09/17/2020   Procedure: ESOPHAGOGASTRODUODENOSCOPY (EGD) WITH PROPOFOL;  Surgeon: Lesly Rubenstein, MD;  Location: [facility] ENDOSCOPY;  Service: Endoscopy;  Laterality: N/A;   EXCISION HYDRADENITIS LABIA Left 11/19/2016   Procedure: EXCISION LABIAL MASS;  Surgeon: Robert Bellow, MD;  Location: [facility] ORS;  Service: General;  Laterality: Left;   EYE SURGERY Bilateral    cataracts   HEEL SPUR EXCISION     HIP ARTHROPLASTY Left 02/23/2018   Procedure: ARTHROPLASTY BIPOLAR HIP (HEMIARTHROPLASTY);  Surgeon: Thornton Park, MD;  Location: [facility] ORS;  Service: Orthopedics;  Laterality: Left;   IR KYPHO LUMBAR INC FX REDUCE BONE BX UNI/BIL CANNULATION INC/IMAGING  12/16/2021   KYPHOPLASTY N/A 04/26/2019   Procedure: KYPHOPLASTY T7;  Surgeon: Hessie Knows, MD;  Location: [facility] ORS;  Service: Orthopedics;  Laterality: N/A;   KYPHOPLASTY N/A 05/17/2019   Procedure: KYPHOPLASTY T8;  Surgeon: Hessie Knows, MD;  Location: [facility] ORS;  Service: Orthopedics;  Laterality: N/A;   KYPHOPLASTY N/A 07/26/2019   Procedure: T-9 KYPHOPLASTY;  Surgeon: Hessie Knows, MD;  Location: [facility] ORS;  Service: Orthopedics;  Laterality: N/A;   MASS EXCISION Left 11/19/2016   Procedure: EXCISION LEFT THIGH MELANOMA;  Surgeon: Robert Bellow, MD;   Location: [facility] ORS;  Service: General;  Laterality: Left;   RIGHT/LEFT HEART CATH AND CORONARY ANGIOGRAPHY N/A 11/15/2020   Procedure: RIGHT/LEFT HEART CATH AND CORONARY ANGIOGRAPHY poss intervention;  Surgeon: Minna Merritts, MD;  Location: [place] CV LAB;  Service: Cardiovascular;  Laterality: N/A;   SENTINEL NODE BIOPSY Left 11/19/2016   Procedure: INGUINAL SENTINEL NODE BIOPSY;  Surgeon: Robert Bellow, MD;  Location: [facility] ORS;  Service: General;  Laterality: Left;   UPPER ESOPHAGEAL ENDOSCOPIC ULTRASOUND (EUS) N/A 01/29/2017   Procedure: UPPER ESOPHAGEAL ENDOSCOPIC ULTRASOUND (EUS);  Surgeon: Holly Bodily, MD;  Location: [facility] ENDOSCOPY;  Service: Endoscopy;  Laterality: N/A;   Social History:  reports that she has never smoked. She has never used smokeless tobacco. She reports that she does not drink alcohol and does not use drugs.  Allergies  Allergen Reactions   Remicade [Infliximab] Shortness Of Breath and Itching   Sulfa Antibiotics Other (See Comments)    Other Reaction: tounge cracked   Actonel [Risedronate Sodium] Other (See Comments)  Fosamax [Alendronate Sodium]     GI Bleed    Procardia [Nifedipine] Hives   Elemental Sulfur Swelling   Wellbutrin [Bupropion] Anxiety    Family History  Problem Relation Age of Onset   Endometrial cancer Mother 40   Hypertension Mother    Osteoporosis Mother    Bladder Cancer Father    COPD Father    Cerebral palsy Sister     Prior to Admission medications   Medication Sig Start Date End Date Taking? Authorizing Provider  amLODipine (NORVASC) 5 MG tablet Take 1 tablet (5 mg total) by mouth daily. 09/23/21  Yes Minna Merritts, MD  aspirin 81 MG tablet Take 81 mg by mouth daily.    Yes [provider]  atorvastatin (LIPITOR) 40 MG tablet Take 1 tablet (40 mg total) by mouth daily. Patient taking differently: Take 40 mg by mouth at bedtime. 09/23/21  Yes Gollan, Kathlene November, MD  calcitonin, salmon,  (MIACALCIN/FORTICAL) 200 UNIT/ACT nasal spray Place 1 spray into alternate nostrils daily. 12/20/21  Yes Danford, Suann Larry, MD  cephALEXin (KEFLEX) 500 MG capsule Take 500 mg by mouth 2 (two) times daily.   Yes [provider]  clopidogrel (PLAVIX) 75 MG tablet Take 1 tablet (75 mg total) by mouth daily. 09/23/21  Yes Gollan, Kathlene November, MD  DULoxetine (CYMBALTA) 60 MG capsule Take 60 mg by mouth daily. In the morning. 08/05/18  Yes [provider]  ezetimibe (ZETIA) 10 MG tablet Take 1 tablet (10 mg total) by mouth daily. Patient taking differently: Take 10 mg by mouth at bedtime. 09/23/21  Yes Gollan, Kathlene November, MD  furosemide (LASIX) 20 MG tablet Take 1 tablet (20 mg total) by mouth every other day. May take extra 20 mg for swelling or shortness of breath Patient taking differently: Take 20 mg by mouth every other day. 09/23/21  Yes Gollan, Kathlene November, MD  gabapentin (NEURONTIN) 600 MG tablet Take 1 tablet by mouth 3 (three) times daily. 04/05/21  Yes [provider]  hydroxychloroquine (PLAQUENIL) 200 MG tablet Take 1 tablet (200 mg total) by mouth 2 (two) times daily. 09/23/21  Yes Gollan, Kathlene November, MD  iron polysaccharides (NIFEREX) 150 MG capsule Take 1 capsule (150 mg total) by mouth daily. 12/20/21  Yes Danford, Suann Larry, MD  isosorbide mononitrate (IMDUR) 30 MG 24 hr tablet Take 0.5 tablets (15 mg total) by mouth daily. 09/23/21  Yes Gollan, Kathlene November, MD  latanoprost (XALATAN) 0.005 % ophthalmic solution Place 1 drop into both eyes at bedtime.    Yes [provider]  metoprolol tartrate (LOPRESSOR) 25 MG tablet Take 0.5 tablets (12.5 mg total) by mouth 2 (two) times daily. 09/23/21  Yes Gollan, Kathlene November, MD  Multiple Vitamin (MULTIVITAMIN WITH MINERALS) TABS tablet Take 1 tablet by mouth daily.   Yes [provider]  acetaminophen (TYLENOL) 325 MG tablet Take 2 tablets (650 mg total) by mouth every 6 (six) hours as needed for mild pain or  fever. 12/19/21   Danford, Suann Larry, MD  albuterol (VENTOLIN HFA) 108 (90 Base) MCG/ACT inhaler Inhale 2 puffs into the lungs every 4 (four) hours as needed for wheezing or shortness of breath. 06/02/19   Bonnielee Haff, MD  ALPRAZolam Duanne Moron) 0.5 MG tablet Take 1 tablet (0.5 mg total) by mouth 4 (four) times daily as needed for anxiety. Patient taking differently: Take 0.5 mg by mouth every 12 (twelve) hours as needed for anxiety. 12/19/21   Danford, Suann Larry, MD  methocarbamol (ROBAXIN)  500 MG tablet Take 1 tablet (500 mg total) by mouth every 8 (eight) hours as needed for muscle spasms. Patient not taking: Reported on 01/12/2022 12/19/21   Edwin Dada, MD  Nintedanib (OFEV) 150 MG CAPS Take 1 capsule (150 mg total) by mouth daily. Patient not taking: Reported on 01/12/2022 05/22/20   Martyn Ehrich, NP  nitroGLYCERIN (NITROSTAT) 0.4 MG SL tablet DISSOLVE 1 TABLET UNDER THE TONGUE EVERY5 MINUTES AS NEEDED FOR CHEST PAIN 04/15/21   Minna Merritts, MD  oxyCODONE-acetaminophen (PERCOCET/ROXICET) 5-325 MG tablet Take 1 tablet by mouth every 4 (four) hours as needed for moderate pain. Patient not taking: Reported on 01/12/2022 12/19/21   Edwin Dada, MD  polyethylene glycol (MIRALAX / GLYCOLAX) packet Take 17 g by mouth daily as needed (constipation.).     [provider]  zolpidem (AMBIEN) 10 MG tablet Take 1 tablet (10 mg total) by mouth at bedtime as needed for sleep. Patient not taking: Reported on 01/12/2022 12/19/21   Edwin Dada, MD    Physical Exam: Vitals:   01/12/22 1230 01/12/22 1300 01/12/22 1330 01/12/22 1400  BP: (!) 119/51 (!) 114/55 114/71 (!) 112/99  Pulse: 82 81 81 79  Resp: 14 (!) 30 (!) 23 19  Temp: (!) 101.7 F (38.7 C)   99.7 F (37.6 C)  TempSrc: Rectal   Oral  SpO2: 98% 100% 100% 98%  Weight:      Height:       Physical Exam Vitals and nursing note reviewed.  Constitutional:      Appearance: She is ill-appearing.      Comments: Lethargic.  Oriented to person and place not to time.  Noted to have increased work of breathing and audible wheezes  HENT:     Head: Normocephalic and atraumatic.     Nose:     Comments: Nasal cannula in place    Mouth/Throat:     Mouth: Mucous membranes are dry.  Eyes:     Pupils: Pupils are equal, round, and reactive to light.  Cardiovascular:     Rate and Rhythm: Normal rate and regular rhythm.  Pulmonary:     Breath sounds: Wheezing present.     Comments: Increased work of breathing, audible expiratory wheezes Abdominal:     Tenderness: There is abdominal tenderness.     Comments: Right lower quadrant tenderness  Genitourinary:    Rectum: Guaiac result negative.     Comments: Foley catheter in place Musculoskeletal:     Cervical back: Normal range of motion and neck supple.  Skin:    General: Skin is warm and dry.  Neurological:     General: No focal deficit present.     Motor: Weakness present.  Psychiatric:     Comments: Depressed mood and affect     Data Reviewed: Lab reviewed Noted to have sodium 129, BUN of 14 and serum creatinine of 1.05 compared to baseline of 0.82.  Lactic acid 9.3 Leukocytosis with a white count of 15,000, hemoglobin 7.7 compared to baseline of 10.2 from 2 weeks ago Urine analysis with pyuria Respiratory viral panel reviewed and negative Chest x-ray reviewed by me shows multifocal bilateral patchy lung opacities superimposed on chronic interstitial lung disease. I have ordered arterial blood gas and will review results once available. I have also ordered a CT scan of abdomen and pelvis for further evaluation of right lower quadrant pain. There are no new results to review at this time.  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)   Anemia   [name] S/P percutaneous coronary angioplasty   Chronic diastolic CHF (congestive heart failure) (HCC)    Depression with anxiety   Acute respiratory failure (HCC)   Acute metabolic encephalopathy    Sepsis from multifocal pneumonia with acute respiratory failure As evidenced by fever with a Tmax of 101.7, leukocytosis, tachypnea and imaging that shows multifocal pneumonia Patient was hypoxic on room air with pulse oximetry in the 80s, increased work of breathing, tachypnea and wheezing and is currently on 2 L of oxygen to maintain pulse oximetry greater than 92% We will place patient empirically on IV Rocephin and Zithromax Consult speech therapy for swallow function evaluation Follow-up results of blood and urine culture     History of rheumatoid arthritis Continue hydroxychloroquine    History of idiopathic interstitial fibrosis of the lungs We will place patient on as needed bronchodilator therapy Start patient on inhaled steroids      Anemia Patient noted to have a drop in her H&H from 10.2, 2 weeks ago to 7.7 Stool is guaiac negative Monitor H&H closely and transfuse as needed Hold aspirin and Plavix    Hyponatremia Probably related to poor oral intake and hypovolemia Judicious IV fluid resuscitation Check sodium levels    History of coronary artery disease Hold aspirin, metoprolol, nitrates and Plavix due to acute anemia and relative hypotension     Urinary retention status post indwelling Foley catheter Voiding trial was attempted on 01/09/22 and patient failed and Foley catheter had to be reinserted. She has been on Keflex for presumed UTI UA shows pyuria Continue Rocephin 1 g IV daily until urine culture results become available     Acute Metabolic Encephalopathy Most likely secondary to underlying sepsis At baseline patient is usually awake, alert and oriented x3 Expect improvement in patient's mental status following resolution of infection Hold Xanax for now  Advance Care Planning:   Code Status: Full Code Full code  Consults: None  Family  Communication: Greater than 50% of time was spent discussing patient's condition and plan of care with her son at the bedside.  He is not her healthcare power of attorney and request that I speak to him to discuss her CODE STATUS.  All questions and concerns have been addressed.  He verbalizes understanding and agrees to the plan.  Severity of Illness: The appropriate patient status for this patient is INPATIENT. Inpatient status is judged to be reasonable and necessary in order to provide the required intensity of service to ensure the patient's safety. The patient's presenting symptoms, physical exam findings, and initial radiographic and laboratory data in the context of their chronic comorbidities is felt to place them at high risk for further clinical deterioration. Furthermore, it is not anticipated that the patient will be medically stable for discharge from the hospital within 2 midnights of admission.   * I certify that at the point of admission it is my clinical judgment that the patient will require inpatient hospital care spanning beyond 2 midnights from the point of admission due to high intensity of service, high risk for further deterioration and high frequency of surveillance required.*  Author: Collier Bullock, MD 01/12/2022 2:29 PM  For on call review [email or web address].

## 2022-01-12 NOTE — Sepsis Progress Note (Signed)
Sepsis protocol is being followed by [facility]. 

## 2022-01-12 NOTE — Progress Notes (Signed)
CODE SEPSIS - PHARMACY COMMUNICATION  **Broad Spectrum Antibiotics should be administered within 1 hour of Sepsis diagnosis**  Time Code Sepsis Called/Page Received: 1145  Antibiotics Ordered: cefepime, metronidazole and vancomycin  Time of 1st antibiotic administration: 1222  Additional action taken by pharmacy: none required  If necessary, Name of Provider/Nurse Contacted: N/A    Dallie Piles ,PharmD Clinical Pharmacist  01/12/2022  11:58 AM

## 2022-01-12 NOTE — Plan of Care (Signed)
  Problem: Education: Goal: Knowledge of General Education information will improve Description Including pain rating scale, medication(s)/side effects and non-pharmacologic comfort measures Outcome: Progressing   

## 2022-01-12 NOTE — ED Provider Notes (Signed)
Medical screening examination/treatment/procedure(s) were conducted as a shared visit with non-physician practitioner(s) and myself.  I personally evaluated the patient during the encounter.  I personally saw and evaluated the patient.  She is noted to have mild hypoxia, febrile.  I personally viewed her chest x-ray.  Saw the patient, discussed plan of care with her family at the bedside, also discussed with the patient, reviewed labs and help develop plan.  We will place consultation to the hospitalist for evaluation and consideration for possible admission   Delman Kitten, MD 01/12/22 1314

## 2022-01-13 ENCOUNTER — Inpatient Hospital Stay: Payer: Medicare Other

## 2022-01-13 DIAGNOSIS — A419 Sepsis, unspecified organism: Secondary | ICD-10-CM | POA: Diagnosis not present

## 2022-01-13 DIAGNOSIS — J9601 Acute respiratory failure with hypoxia: Secondary | ICD-10-CM | POA: Diagnosis not present

## 2022-01-13 DIAGNOSIS — R6520 Severe sepsis without septic shock: Secondary | ICD-10-CM | POA: Diagnosis not present

## 2022-01-13 LAB — CBC
HCT: 22.3 % — ABNORMAL LOW (ref 36.0–46.0)
Hemoglobin: 6.9 g/dL — ABNORMAL LOW (ref 12.0–15.0)
MCH: 25.4 pg — ABNORMAL LOW (ref 26.0–34.0)
MCHC: 30.9 g/dL (ref 30.0–36.0)
MCV: 82 fL (ref 80.0–100.0)
Platelets: 658 10*3/uL — ABNORMAL HIGH (ref 150–400)
RBC: 2.72 MIL/uL — ABNORMAL LOW (ref 3.87–5.11)
RDW: 16.4 % — ABNORMAL HIGH (ref 11.5–15.5)
WBC: 13.5 10*3/uL — ABNORMAL HIGH (ref 4.0–10.5)
nRBC: 0 % (ref 0.0–0.2)

## 2022-01-13 LAB — PREPARE RBC (CROSSMATCH)

## 2022-01-13 LAB — BASIC METABOLIC PANEL
Anion gap: 8 (ref 5–15)
BUN: 11 mg/dL (ref 8–23)
CO2: 21 mmol/L — ABNORMAL LOW (ref 22–32)
Calcium: 7.9 mg/dL — ABNORMAL LOW (ref 8.9–10.3)
Chloride: 102 mmol/L (ref 98–111)
Creatinine, Ser: 0.75 mg/dL (ref 0.44–1.00)
GFR, Estimated: >60 mL/min (ref >60)
Glucose, Bld: 56 mg/dL — ABNORMAL LOW (ref 70–99)
Potassium: 3.7 mmol/L (ref 3.5–5.1)
Sodium: 131 mmol/L — ABNORMAL LOW (ref 135–145)

## 2022-01-13 LAB — URINE CULTURE: Culture: NO GROWTH

## 2022-01-13 LAB — PROTIME-INR
INR: 1.3 — ABNORMAL HIGH (ref 0.8–1.2)
Prothrombin Time: 16.2 seconds — ABNORMAL HIGH (ref 11.4–15.2)

## 2022-01-13 LAB — D-DIMER, QUANTITATIVE: D-Dimer, Quant: 7.49 ug/mL-FEU — ABNORMAL HIGH (ref 0.00–0.50)

## 2022-01-13 LAB — PROCALCITONIN: Procalcitonin: 0.15 ng/mL

## 2022-01-13 LAB — MRSA NEXT GEN BY PCR, NASAL: MRSA by PCR Next Gen: NOT DETECTED

## 2022-01-13 LAB — SAVE SMEAR(SSMR), FOR PROVIDER SLIDE REVIEW

## 2022-01-13 LAB — CORTISOL-AM, BLOOD: Cortisol - AM: 9.6 ug/dL (ref 6.7–22.6)

## 2022-01-13 LAB — PATHOLOGIST SMEAR REVIEW

## 2022-01-13 LAB — STREP PNEUMONIAE URINARY ANTIGEN: Strep Pneumo Urinary Antigen: NEGATIVE

## 2022-01-13 MED ORDER — GABAPENTIN 300 MG PO CAPS
300.0000 mg | ORAL_CAPSULE | Freq: Three times a day (TID) | ORAL | Status: DC
  Administered 2022-01-13 – 2022-01-17 (×14): 300 mg via ORAL
  Filled 2022-01-13 (×14): qty 1

## 2022-01-13 MED ORDER — MELATONIN 5 MG PO TABS
2.5000 mg | ORAL_TABLET | Freq: Every evening | ORAL | Status: DC | PRN
  Administered 2022-01-14 – 2022-01-16 (×3): 2.5 mg via ORAL
  Filled 2022-01-13 (×4): qty 1

## 2022-01-13 MED ORDER — FUROSEMIDE 10 MG/ML IJ SOLN
20.0000 mg | Freq: Two times a day (BID) | INTRAMUSCULAR | Status: AC
Start: 2022-01-13 — End: 2022-01-15
  Administered 2022-01-13 – 2022-01-15 (×4): 20 mg via INTRAVENOUS
  Filled 2022-01-13 (×4): qty 2

## 2022-01-13 MED ORDER — SODIUM CHLORIDE 0.9 % IV SOLN
2.0000 g | Freq: Two times a day (BID) | INTRAVENOUS | Status: DC
  Administered 2022-01-13 – 2022-01-14 (×4): 2 g via INTRAVENOUS
  Filled 2022-01-13 (×6): qty 2

## 2022-01-13 MED ORDER — CHLORHEXIDINE GLUCONATE CLOTH 2 % EX PADS
6.0000 | MEDICATED_PAD | Freq: Every day | CUTANEOUS | Status: DC
  Administered 2022-01-14 – 2022-01-17 (×4): 6 via TOPICAL

## 2022-01-13 MED ORDER — SODIUM CHLORIDE 0.9% IV SOLUTION: Freq: Once | INTRAVENOUS | Status: AC

## 2022-01-13 MED ORDER — GADOBUTROL 1 MMOL/ML IV SOLN
6.0000 mL | Freq: Once | INTRAVENOUS | Status: AC | PRN
  Administered 2022-01-13: 6 mL via INTRAVENOUS

## 2022-01-13 MED ORDER — BLISTEX MEDICATED EX OINT
TOPICAL_OINTMENT | CUTANEOUS | Status: DC | PRN
  Filled 2022-01-13 (×2): qty 6.3

## 2022-01-13 MED ORDER — METOPROLOL TARTRATE 25 MG PO TABS
12.5000 mg | ORAL_TABLET | Freq: Two times a day (BID) | ORAL | Status: DC
  Administered 2022-01-13 – 2022-01-17 (×8): 12.5 mg via ORAL
  Filled 2022-01-13 (×8): qty 1

## 2022-01-13 MED ORDER — DEXTROSE-NACL 5-0.9 % IV SOLN: INTRAVENOUS | Status: DC

## 2022-01-13 MED ORDER — ISOSORBIDE MONONITRATE ER 30 MG PO TB24
15.0000 mg | ORAL_TABLET | Freq: Every day | ORAL | Status: DC
  Administered 2022-01-13 – 2022-01-17 (×5): 15 mg via ORAL
  Filled 2022-01-13 (×5): qty 1

## 2022-01-13 MED ORDER — ORAL CARE MOUTH RINSE
15.0000 mL | Freq: Two times a day (BID) | OROMUCOSAL | Status: DC
  Administered 2022-01-14 – 2022-01-17 (×6): 15 mL via OROMUCOSAL

## 2022-01-13 MED ORDER — FUROSEMIDE 10 MG/ML IJ SOLN
40.0000 mg | Freq: Once | INTRAMUSCULAR | Status: DC
Start: 2022-01-13 — End: 2022-01-13
  Filled 2022-01-13: qty 4

## 2022-01-13 MED ORDER — SPIRONOLACTONE 25 MG PO TABS
25.0000 mg | ORAL_TABLET | Freq: Every day | ORAL | Status: AC
  Administered 2022-01-13 – 2022-01-15 (×3): 25 mg via ORAL
  Filled 2022-01-13 (×3): qty 1

## 2022-01-13 NOTE — Consult Note (Signed)
PULMONOLOGY         Date: 01/13/2022,   MRN# [id number] Erin [name] Aug 10, 1946     AdmissionWeight: 63.5 kg                 CurrentWeight: 63.5 kg   Referring physician: Dr Si Raider   CHIEF COMPLAINT:   Acute exacerbation of ILD with screroderma   HISTORY OF PRESENT ILLNESS   This is a pleasant patient with hx of RA GERD cad, HCC, came in for acute on set hypoxemia and febrile illness. She required 6l/min to reach normoxia in ED and was sent to ER from ALF.  She is being treated for pneumonia.  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   She has severe anemia and received trasnfusion today.   PAST MEDICAL HISTORY   Past Medical History:  Diagnosis Date   Anemia    h/o   Cancer ([place])    melanoma x 3the last one was of her leg and surgeon removed it   Colon polyp    Complication of anesthesia    PT STATED THAT WITH JOINT SURGERY THAT SHE WAS GIVEN SOME TYPE OF ANESTHESIA THAT MADE HER HALLUCINATE    Coronary artery calcification    Coronary artery disease    DDD (degenerative disc disease), lumbar    Diverticulosis 01/20/2017   Dysphagia    Dyspnea    with exertion-unable to walk a mile without getting sob- dr sparks set pt up to see Dr Rockey Situ after 11-19-16 surgery   Environmental allergies    Esophageal dysmotility    Fibrocystic breast disease    GERD (gastroesophageal reflux disease)    Heart murmur    asymptomatic   Hyperlipidemia    unspecified   Hypertension    Mitral regurgitation    Monilial esophagitis ([place]) 01/20/2017   Obstipation    Osteopenia    Peptic ulcer disease    Pulmonary fibrosis ([place])    per Dr Raul Del   RA (rheumatoid arthritis) ([place])    Raynaud's disease    Redundant colon 01/20/2017   Scleroderma ([place])    Telangiectasia of colon    Tubular adenoma of colon 01/20/2017   unspecified   Vulvar dysplasia      SURGICAL HISTORY   Past Surgical History:  Procedure Laterality  Date   ABDOMINAL HYSTERECTOMY     BREAST CYST EXCISION Left 20+ years ago   No scar visible   BREAST LUMPECTOMY Right    CARDIAC CATHETERIZATION     CARPAL TUNNEL RELEASE Bilateral    COLON SURGERY     colon polyp    COLONOSCOPY  09/30/2011   tubular adenoma rtm   COLONOSCOPY     05/02/2003, 04/10/2000   COLONOSCOPY WITH PROPOFOL N/A 01/20/2017   Procedure: COLONOSCOPY WITH PROPOFOL;  Surgeon: Lollie Sails, MD;  Location: [facility] ENDOSCOPY;  Service: Endoscopy;  Laterality: N/A;   COLONOSCOPY WITH PROPOFOL N/A 09/17/2020   Procedure: COLONOSCOPY WITH PROPOFOL;  Surgeon: Lesly Rubenstein, MD;  Location: [facility] ENDOSCOPY;  Service: Endoscopy;  Laterality: N/A;   CORONARY ATHERECTOMY N/A 11/21/2020   Procedure: CORONARY ATHERECTOMY;  Surgeon: Wellington Hampshire, MD;  Location: [place] CV LAB;  Service: Cardiovascular;  Laterality: N/A;   CORONARY STENT INTERVENTION  11/21/2020   CORONARY STENT INTERVENTION N/A 11/21/2020   Procedure: CORONARY STENT INTERVENTION;  Surgeon: Wellington Hampshire, MD;  Location: [place] CV LAB;  Service:  Cardiovascular;  Laterality: N/A;   ESOPHAGOGASTRODUODENOSCOPY     ESOPHAGOGASTRODUODENOSCOPY     09/30/2011, 04/10/2000 , no repeat rtm   ESOPHAGOGASTRODUODENOSCOPY N/A 03/02/2018   Procedure: ESOPHAGOGASTRODUODENOSCOPY (EGD);  Surgeon: Virgel Manifold, MD;  Location: [facility] ENDOSCOPY;  Service: Endoscopy;  Laterality: N/A;   ESOPHAGOGASTRODUODENOSCOPY (EGD) WITH PROPOFOL N/A 01/20/2017   Procedure: ESOPHAGOGASTRODUODENOSCOPY (EGD) WITH PROPOFOL;  Surgeon: Lollie Sails, MD;  Location: [facility] END[facility]COPY;  Service: Endoscopy;  Laterality: N/A;   ESOPHAGOGASTRODUODENOSCOPY (EGD) WITH PROPOFOL N/A 09/17/2020   Procedure: ESOPHAGOGASTRODUODENOSCOPY (EGD) WITH PROPOFOL;  Surgeon: Lesly Rubenstein, MD;  Location: [facility] ENDOSCOPY;  Service: Endoscopy;  Laterality: N/A;   EXCISION HYDRADENITIS LABIA Left 11/19/2016   Procedure: EXCISION LABIAL MASS;  Surgeon:  Robert Bellow, MD;  Location: [facility] ORS;  Service: General;  Laterality: Left;   EYE SURGERY Bilateral    cataracts   HEEL SPUR EXCISION     HIP ARTHROPLASTY Left 02/23/2018   Procedure: ARTHROPLASTY BIPOLAR HIP (HEMIARTHROPLASTY);  Surgeon: Thornton Park, MD;  Location: [facility] ORS;  Service: Orthopedics;  Laterality: Left;   IR KYPHO LUMBAR INC FX REDUCE BONE BX UNI/BIL CANNULATION INC/IMAGING  12/16/2021   KYPHOPLASTY N/A 04/26/2019   Procedure: KYPHOPLASTY T7;  Surgeon: Hessie Knows, MD;  Location: [facility] ORS;  Service: Orthopedics;  Laterality: N/A;   KYPHOPLASTY N/A 05/17/2019   Procedure: KYPHOPLASTY T8;  Surgeon: Hessie Knows, MD;  Location: [facility] ORS;  Service: Orthopedics;  Laterality: N/A;   KYPHOPLASTY N/A 07/26/2019   Procedure: T-9 KYPHOPLASTY;  Surgeon: Hessie Knows, MD;  Location: [facility] ORS;  Service: Orthopedics;  Laterality: N/A;   MASS EXCISION Left 11/19/2016   Procedure: EXCISION LEFT THIGH MELANOMA;  Surgeon: Robert Bellow, MD;  Location: [facility] ORS;  Service: General;  Laterality: Left;   RIGHT/LEFT HEART CATH AND CORONARY ANGIOGRAPHY N/A 11/15/2020   Procedure: RIGHT/LEFT HEART CATH AND CORONARY ANGIOGRAPHY poss intervention;  Surgeon: Minna Merritts, MD;  Location: [place] CV LAB;  Service: Cardiovascular;  Laterality: N/A;   SENTINEL NODE BIOPSY Left 11/19/2016   Procedure: INGUINAL SENTINEL NODE BIOPSY;  Surgeon: Robert Bellow, MD;  Location: [facility] ORS;  Service: General;  Laterality: Left;   UPPER ESOPHAGEAL ENDOSCOPIC ULTRASOUND (EUS) N/A 01/29/2017   Procedure: UPPER ESOPHAGEAL ENDOSCOPIC ULTRASOUND (EUS);  Surgeon: Holly Bodily, MD;  Location: [facility] ENDOSCOPY;  Service: Endoscopy;  Laterality: N/A;     FAMILY HISTORY   Family History  Problem Relation Age of Onset   Endometrial cancer Mother 54   Hypertension Mother    Osteoporosis Mother    Bladder Cancer Father    COPD Father    Cerebral palsy Sister      SOCIAL HISTORY   Social  History   Tobacco Use   Smoking status: Never   Smokeless tobacco: Never  Vaping Use   Vaping Use: Never used  Substance Use Topics   Alcohol use: No   Drug use: No     MEDICATIONS    Home Medication:    Current Medication:  Current Facility-Administered Medications:    acetaminophen (TYLENOL) tablet 650 mg, 650 mg, Oral, Q6H PRN, Agbata, Tochukwu, MD   atorvastatin (LIPITOR) tablet 40 mg, 40 mg, Oral, QHS, Agbata, Tochukwu, MD, 40 mg at 01/12/22 2349   budesonide (PULMICORT) nebulizer solution 0.5 mg, 0.5 mg, Nebulization, BID, Agbata, Tochukwu, MD, 0.5 mg at 01/13/22 0729   calcitonin (salmon) (MIACALCIN/FORTICAL) nasal spray 1 spray, 1 spray, Alternating Nares, Daily, Agbata, Tochukwu, MD, 1 spray at 01/13/22 1141  ceFEPIme (MAXIPIME) 2 g in sodium chloride 0.9 % 100 mL IVPB, 2 g, Intravenous, Q12H, Oswald Hillock, RPH, Last Rate: 200 mL/hr at 01/13/22 1135, 2 g at 01/13/22 1135   Chlorhexidine Gluconate Cloth 2 % PADS 6 each, 6 each, Topical, Daily, Wouk, Ailene Rud, MD   dextrose 5 %-0.9 % sodium chloride infusion, , Intravenous, Continuous, Wouk, Ailene Rud, MD, Last Rate: 75 mL/hr at 01/13/22 0937, New Bag at 01/13/22 0937   DULoxetine (CYMBALTA) DR capsule 60 mg, 60 mg, Oral, Daily, Agbata, Tochukwu, MD, 60 mg at 01/13/22 9326   ezetimibe (ZETIA) tablet 10 mg, 10 mg, Oral, QHS, Agbata, Tochukwu, MD, 10 mg at 01/12/22 2349   furosemide (LASIX) injection 40 mg, 40 mg, Intravenous, Once, Wouk, Ailene Rud, MD   gabapentin (NEURONTIN) capsule 300 mg, 300 mg, Oral, TID, Wouk, Ailene Rud, MD, 300 mg at 01/13/22 1640   hydroxychloroquine (PLAQUENIL) tablet 200 mg, 200 mg, Oral, BID, Agbata, Tochukwu, MD, 200 mg at 01/13/22 7124   ipratropium-albuterol (DUONEB) 0.5-2.5 (3) MG/3ML nebulizer solution 3 mL, 3 mL, Nebulization, Q6H PRN, Agbata, Tochukwu, MD   iron polysaccharides (NIFEREX) capsule 150 mg, 150 mg, Oral, Daily, Agbata, Tochukwu, MD, 150 mg at 01/13/22 5809    isosorbide mononitrate (IMDUR) 24 hr tablet 15 mg, 15 mg, Oral, Daily, Wouk, Ailene Rud, MD, 15 mg at 01/13/22 0924   latanoprost (XALATAN) 0.005 % ophthalmic solution 1 drop, 1 drop, Both Eyes, QHS, Agbata, Tochukwu, MD, 1 drop at 01/12/22 2350   lip balm (BLISTEX) ointment, , Topical, PRN, Wouk, Ailene Rud, MD   MEDLINE mouth rinse, 15 mL, Mouth Rinse, BID, Wouk, Ailene Rud, MD   metoprolol tartrate (LOPRESSOR) tablet 12.5 mg, 12.5 mg, Oral, BID, Wouk, Ailene Rud, MD, 12.5 mg at 01/13/22 9833   multivitamin with minerals tablet 1 tablet, 1 tablet, Oral, Daily, Agbata, Tochukwu, MD, 1 tablet at 01/13/22 0926   nitroGLYCERIN (NITROSTAT) SL tablet 0.4 mg, 0.4 mg, Sublingual, Q5 min PRN, Agbata, Tochukwu, MD   ondansetron (ZOFRAN) tablet 4 mg, 4 mg, Oral, Q6H PRN **OR** ondansetron (ZOFRAN) injection 4 mg, 4 mg, Intravenous, Q6H PRN, Agbata, Tochukwu, MD   polyethylene glycol (MIRALAX / GLYCOLAX) packet 17 g, 17 g, Oral, Daily PRN, Agbata, Tochukwu, MD    ALLERGIES   Remicade [infliximab], Sulfa antibiotics, Actonel [risedronate sodium], Fosamax [alendronate sodium], Procardia [nifedipine], Elemental sulfur, and Wellbutrin [bupropion]     REVIEW OF SYSTEMS    Review of Systems:  Gen:  Denies  fever, sweats, chills weigh loss  HEENT: Denies blurred vision, double vision, ear pain, eye pain, hearing loss, nose bleeds, sore throat Cardiac:  No dizziness, chest pain or heaviness, chest tightness,edema Resp:   Denies cough or sputum porduction, shortness of breath,wheezing, hemoptysis,  Gi: Denies swallowing difficulty, stomach pain, nausea or vomiting, diarrhea, constipation, bowel incontinence Gu:  Denies bladder incontinence, burning urine Ext:   Denies Joint pain, stiffness or swelling Skin: Denies  skin rash, easy bruising or bleeding or hives Endoc:  Denies polyuria, polydipsia , polyphagia or weight change Psych:   Denies depression, insomnia or hallucinations   Other:  All  other systems negative   VS: BP (!) 124/58 (BP Location: Left Arm)    Pulse 83    Temp 98.9 F (37.2 C) (Oral)    Resp 20    Ht 5' (1.524 m)    Wt 63.5 kg    LMP  (LMP Unknown)    SpO2 96%    BMI 27.34 kg/m  PHYSICAL EXAM    GENERAL:NAD, no fevers, chills, no weakness no fatigue HEAD: Normocephalic, atraumatic.  EYES: Pupils equal, round, reactive to light. Extraocular muscles intact. No scleral icterus.  MOUTH: Moist mucosal membrane. Dentition intact. No abscess noted.  EAR, NOSE, THROAT: Clear without exudates. No external lesions.  NECK: Supple. No thyromegaly. No nodules. No JVD.  PULMONARY: mild ronchi bilaterally  CARDIOVASCULAR: S1 and S2. Regular rate and rhythm. No murmurs, rubs, or gallops. No edema. Pedal pulses 2+ bilaterally.  GASTROINTESTINAL: Soft, nontender, nondistended. No masses. Positive bowel sounds. No hepatosplenomegaly.  MUSCULOSKELETAL: No swelling, clubbing, or edema. Range of motion full in all extremities.  NEUROLOGIC: Cranial nerves II through XII are intact. No gross focal neurological deficits. Sensation intact. Reflexes intact.  SKIN: No ulceration, lesions, rashes, or cyanosis. Skin warm and dry. Turgor intact.  PSYCHIATRIC: Mood, affect within normal limits. The patient is awake, alert and oriented x 3. Insight, judgment intact.       IMAGING    CT ABDOMEN PELVIS WO CONTRAST  Result Date: 01/12/2022 CLINICAL DATA:  Abdominal pain, acute. EXAM: CT ABDOMEN AND PELVIS WITHOUT CONTRAST TECHNIQUE: Multidetector CT imaging of the abdomen and pelvis was performed following the standard protocol without IV contrast. RADIATION DOSE REDUCTION: This exam was performed according to the departmental dose-optimization program which includes automated exposure control, adjustment of the mA and/or kV according to patient size and/or use of iterative reconstruction technique. COMPARISON:  CT pelvis 12/09/2021.  Chest radiography same day. FINDINGS: Lower chest:  Bilateral pleural effusions layering dependently with dependent atelectasis. Some areas of pulmonary scarring and some pleural emphysematous change related to chronic lung disease. The heart is enlarged. There are coronary artery calcifications. Hepatobiliary: Liver parenchyma is normal. Question sludge or small stones dependent in the gallbladder. Pancreas: Normal Spleen: Normal Adrenals/Urinary Tract: Adrenal glands are normal. Kidneys are normal. No cyst, mass, stone or hydronephrosis. Foley catheter in the bladder. Stomach/Bowel: Stomach and small intestine are normal. Moderate amount of stool and gas within the colon but no acute finding. Vascular/Lymphatic: Aortic atherosclerosis. No aneurysm. IVC is normal. No adenopathy. Reproductive: Limited detail because of artifact from the hip replacement. No evidence of pelvic mass. Other: No free fluid or air. Musculoskeletal: Previous vertebral augmentations at T9 and L1. Lower lumbar degenerative changes. Previous hip replacement on the left. Fatty atrophy of the thigh musculature on right. IMPRESSION: No acute finding to explain abdominal pain. Bilateral pleural effusions layering dependently with dependent atelectasis. Chronic lung disease. Cardiomegaly, coronary artery calcification and aortic atherosclerotic calcification. Question small amount of sludge or small stones dependent in the gallbladder. Consider ultrasound correlation. Moderate amount of stool and gas in the colon, but not out of the range of normal. Electronically Signed   By: Nelson Chimes M.D.   On: 01/12/2022 15:53   DG Chest 1 View  Result Date: 01/12/2022 CLINICAL DATA:  Respiratory distress EXAM: CHEST  1 VIEW COMPARISON:  January 12, 2022 at 11:52 a.m. FINDINGS: Bilateral patchy pulmonary opacities persists but appears somewhat improved in the interval. The cardiomediastinal silhouette is stable. No pneumothorax. No other changes. IMPRESSION: Persistent but mildly improved bilateral patchy  pulmonary infiltrates. Electronically Signed   By: Dorise Bullion III M.D.   On: 01/12/2022 13:58   MR Lumbar Spine W Wo Contrast  Result Date: 01/13/2022 CLINICAL DATA:  Fever.  Recent L1 kyphoplasty. EXAM: MRI LUMBAR SPINE WITHOUT AND WITH CONTRAST TECHNIQUE: Multiplanar and multiecho pulse sequences of the lumbar spine were obtained without and with intravenous contrast.  CONTRAST:  13mL GADAVIST GADOBUTROL 1 MMOL/ML IV SOLN COMPARISON:  MRI lumbar spine dated December 09, 2021. FINDINGS: Segmentation:  Standard. Alignment: Unchanged mild retrolisthesis at L2-L3. Unchanged trace anterolisthesis at L4-L5 and L5-S1. Vertebrae: Interval cement augmentation at L1 with mild residual marrow edema. No progressive height loss. No new fracture, evidence of discitis, or suspicious bone lesion. Conus medullaris and cauda equina: Conus extends to the L2 level. Conus and cauda equina appear normal. No intrathecal enhancement. Paraspinal and other soft tissues: Small 1.7 cm intrinsically T1 hyperintense fluid collection with low T2 and T1 signal rim adjacent to the left L1 posterior elements, most consistent with a small hematoma. Disc levels: T12-L1:  Negative. L1-L2: Unchanged mild retropulsion of the L1 posteroinferior endplate. No significant disc bulge or herniation. No stenosis. L2-L3: Unchanged asymmetric left-sided disc bulging and endplate spurring. Unchanged moderate left neuroforaminal stenosis. No spinal canal or right neuroforaminal stenosis. L3-L4: Unchanged small broad-based posterior disc protrusion and mild bilateral facet arthropathy. No stenosis. L4-L5: Unchanged small broad-based posterior disc protrusion and moderate bilateral facet arthropathy. No stenosis. L5-S1: Unchanged small broad-based posterior disc protrusion eccentric to the right. Severe right and mild left facet arthropathy with bulky ligamentum flavum hypertrophy. New prominent right perifacet marrow edema. Unchanged mild right lateral recess  stenosis and moderate right neuroforaminal stenosis. No spinal canal or left neuroforaminal stenosis. IMPRESSION: 1. Interval cement augmentation at L1 without progressive height loss. No new fracture or other acute abnormality. 2. Multilevel lumbar spondylosis as described above, overall similar to prior study. Unchanged moderate left neuroforaminal stenosis at L2-L3 and moderate right neuroforaminal stenosis at L5-S1. 3. Progressive severe right facet arthropathy at L5-S1 with new degenerative inflammatory change, which can be a source of pain. Electronically Signed   By: Titus Dubin M.D.   On: 01/13/2022 15:57   DG Chest Port 1 View  Result Date: 01/12/2022 CLINICAL DATA:  76 year old female with possible sepsis. EXAM: PORTABLE CHEST 1 VIEW COMPARISON:  CTA chest 05/28/2019 and earlier. FINDINGS: Portable AP upright view at 1153 hours. Chronically reduced lung volumes with evidence of interstitial lung disease on 2020 CT. Multifocal bilateral patchy and confluent new pulmonary opacity since radiographs in June 2020. No superimposed pneumothorax or pleural effusion. No definite consolidation. Previously augmented thoracic and lumbar compression fractures. Mediastinal contours remain normal. Visualized tracheal air column is within normal limits. No acute osseous abnormality identified. Negative visible bowel gas. IMPRESSION: Multifocal bilateral patchy lung opacity opacity superimposed on suspected chronic interstitial lung disease with diminished lung volumes. Favor acute bilateral pneumonia in the setting of suspected sepsis. No pleural effusion. Electronically Signed   By: Genevie Ann M.D.   On: 01/12/2022 12:07   IR KYPHO LUMBAR INC FX REDUCE BONE BX UNI/BIL CANNULATION INC/IMAGING  Result Date: 12/16/2021 CLINICAL DATA:  History of fall. Symptomatic L1 compression fracture deformity. Failed conservative medical management EXAM: FLUOROSCOPIC GUIDED KYPHOPLASTY OF L1 VERTEBRAL BODY COMPARISON:  MR L-spine  and lumbar XRs, 12/09/2021. MEDICATIONS: As antibiotic prophylaxis, Ancef 2 g IV was ordered pre-procedure and administered intravenously within 1 hour of incision. ANESTHESIA/SEDATION: Moderate (conscious) sedation was employed during this procedure. A total of Versed 4 mg and Fentanyl 200 mcg was administered intravenously. Moderate Sedation Time: 72 minutes. The patient's level of consciousness and vital signs were monitored continuously by radiology nursing throughout the procedure under my direct supervision. FLUOROSCOPY TIME:  12 minutes, 36 seconds (867.6 mGy) COMPLICATIONS: None immediate. TECHNIQUE: The procedure, risks (including but not limited to bleeding, infection, organ damage), benefits, and  alternatives were explained to the patient. Questions regarding the procedure were encouraged and answered. The patient understands and consents to the procedure. The patient was placed prone on the fluoroscopic table. The skin overlying the upper thoracic region was then prepped and draped in the usual sterile fashion. Maximal barrier sterile technique was utilized including caps, mask, sterile gowns, sterile gloves, sterile drape, hand hygiene and skin antiseptic. Intravenous Fentanyl and Versed were administered as conscious sedation during continuous cardiorespiratory monitoring by the radiology RN. The LEFT pedicle at L1 vertebral body was then infiltrated with 1% lidocaine followed by the advancement of a Kyphon trocar needle transpedicular at the LEFT pedicle into the posterior one-third of the vertebral body. Subsequently, the osteo drill was advanced to the anterior third of the vertebral body. The osteo drill was retracted. The maneuvers were repeated for trocar needle insertion into the RIGHT pedicle at L1 vertebral body. Through the working cannulas, Kyphon inflatable bone tamps 15 x 3 was advanced and positioned with the distal marker approximately 5 mm from the anterior aspect of the cortex.  Appropriate positioning was confirmed on the AP projection. At this time, the balloons were expanded using contrast via a Kyphon inflation syringe device via micro tubing. Inflations was continued until there was near apposition with the superior end plate. At this time, methylmethacrylate (PMMA) mixture was reconstituted in the Kyphon bone mixing device system. This was then loaded into the delivery mechanism, attached to Kyphon bone filler. The balloons were deflated and removed followed by the instillation of methylmethacrylate mixture with excellent filling in the AP and lateral projections. Approximately 8 mL PMMA was administered. No extravasation was noted in the disk spaces or posteriorly into the spinal canal. No epidural venous contamination was seen. The working cannula and the bone filler was then retrieved and removed. Hemostasis was achieved with manual compression. The patient tolerated the procedure well without immediate postprocedural complication. IMPRESSION: Successful L1 vertebral body augmentation using balloon kyphoplasty, using a bi-pedicular approach, as above. Michaelle Birks, MD [facility] Electronically Signed   By: Michaelle Birks M.D.   On: 12/16/2021 17:51      ASSESSMENT/PLAN   Acute hypoxemic respiratory failure   Likely due to acute exacerbation of ILD/pulmonary fibrosis    - etiology may be LRTI with viral infection    - Resp viral panel ordered    - rheumatoid factor    - CRP trend    -PCT is low 0.15     -agree with plaqenil 200 bid     -starting solumedrol 20 bid   Severe normocytic anemia    - s/p blood transfusion      Patietn denies blood in stool or urine   Thank you for allowing me to participate in the care of this patient.  Total face to face encounter time for this patient visit was >45 min. >50% of the time was  spent in counseling and coordination of care.   Patient/Family are satisfied with  care plan and all questions have been answered.  This document was prepared using Dragon voice recognition software and may include unintentional dictation errors.     Ottie Glazier, M.D.  Division of [place]

## 2022-01-13 NOTE — Progress Notes (Signed)
PROGRESS NOTE    Erin [name]  HQI:[id number] DOB: 1946/05/20 DOA: 01/12/2022 PCP: Idelle Crouch, MD  Outpatient Specialists: pulmonology, rheumatology, cardiology, urology    Brief Narrative:   From admission h and p Erin [name] is a 76 y.o. female with medical history significant for hypertension, coronary artery disease, GERD who was sent from the assisted living facility where she currently resides to the ER for evaluation of fever and hypoxia. Patient was noted to have a Tmax of 100.1 at the skilled nursing facility and room air pulse oximetry was in the 80s.  She was placed on 6 L of oxygen with improvement in her pulse oximetry to 93%. She is oriented to person and place but at baseline is usually awake, alert and oriented x3. Patient's son states that she was recently hospitalized following a fall and at that time was found to have an L1 compression fracture status post kyphoplasty by interventional radiology.  Her hospital course was complicated by urinary retention and she was discharged to subacute rehab with a Foley catheter. Foley catheter was removed on 01/09/22 by urology but had to be replaced same day because of voiding difficulty. Patient has been on Keflex prescribed by urology for suspected UTI. 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 CXR showing patchy lung capacities read as superimposed infection. This on report of new o2 requirement at living facility. No neck  stiffness/pain to suggest meningitis - continue [place] O2 - sputum for culture if can produce - urine antigens, blood cultures - mrsa swab - stop cef/azith, start cefepime, given ILD - will query pulm about benefit of starting steroids  # Anemia Hgb 6.9 from baseline of around 9. No report of melena or hematochezia, ct of abd/pelvis w/o signs bleeding, guaiac in ED reportedly negative. Is on asa/plavix for hx stent in 2021 - will transfuse 1 unit - f/u smear  # Recent L1 kyphoplasty Here febrile. Likely 2/2 CAP - will check MRI of lumbar spine to r/o infectious process there  # [name] # HFpEF S/p stent in 2021. No chest pain, trops very mildly elevated and flat, do not think acs - cont home statin, zetia - hold aspirin/plavix for now - cont home imdur, metop - hold home lasix which takes EOD  # Chronic pain - home duloxetine - give gabapentin at reduced dose of 300 tid  # RA - cont home plaquenil  # HTN Here bp low normal - hold home amlodipine  # Hyponatremia Chronic, 131 this morning is stable for her - monitor  # Urinary retention New problem at last hospitalization. Failed TOV few days ago. - maintain foley - f/u urine culture - outpt urology f/u  # Acute metabolic encephalopathy Appears to be improving - home benzo on hold - gaba giving at reduced dose - swallow eval   DVT prophylaxis: SCDs Code Status: DNR, confirmed with POA today Family Communication: hcpoa Reid updated 2/6  Level of care: Progressive Status is: Inpatient Remains inpatient appropriate because: severity of illness  Consultants:  none  Procedures: none  Antimicrobials:  cefepime    Subjective: This morning says feels somewhat sob. No vomiting or diarrhea. No chest pain  Objective: Vitals:   01/12/22 2020 01/13/22 0039 01/13/22 0417 01/13/22 0746  BP:  (!) 116/53 118/62 108/82  Pulse:  88 92 90  Resp:  18 20 20   Temp:  98.3 F (36.8 C) 99 F (37.2 C) 98 F  (36.7 C)  TempSrc:   Oral   SpO2: 97% 95% 99% 97%  Weight:      Height:        Intake/Output Summary (Last 24 hours) at 01/13/2022 0838 Last data filed at 01/12/2022 1411 Gross per 24 hour  Intake 2192.78 ml  Output --  Net 2192.78 ml   Filed Weights   01/12/22 1132  Weight: 63.5 kg    Examination:  General exam: Appears calm and comfortable  Respiratory system: faint rales throughout Cardiovascular system: S1 & S2 heard, RRR. No JVD, murmurs, rubs, gallops or clicks.  Gastrointestinal system: Abdomen is nondistended, soft and nontender. No organomegaly or masses felt. Normal bowel sounds heard. Central nervous system: Alert and oriented to self. Moving all 4 ext Extremities: Symmetric 5 x 5 power. Trace pedal edema Skin: No rashes, lesions or ulcers Psychiatry: Judgement and insight appear normal. Mood & affect appropriate.     Data Reviewed: I have personally reviewed following labs and imaging studies  CBC: Recent Labs  Lab 01/12/22 1151 01/13/22 0513  WBC 15.5* 13.5*  NEUTROABS 12.5*  --   HGB 7.7* 6.9*  HCT 24.6* 22.3*  MCV 83.1 82.0  PLT 731* 409*   Basic Metabolic Panel: Recent Labs  Lab 01/12/22 1151 01/13/22 0513  NA 129* 131*  K 4.1 3.7  CL 99 102  CO2 23 21*  GLUCOSE 88 56*  BUN 14 11  CREATININE 1.05* 0.75  CALCIUM 8.0* 7.9*   GFR: Estimated Creatinine Clearance: 49.8 mL/min (by C-G formula based on SCr of 0.75 mg/dL). Liver Function Tests: Recent Labs  Lab 01/12/22 1151  AST 34  ALT 21  ALKPHOS 88  BILITOT 0.5  PROT 5.6*  ALBUMIN 2.4*   No results for input(s): LIPASE, AMYLASE in the last 168 hours. No results for input(s): AMMONIA in the last 168 hours. Coagulation Profile: Recent Labs  Lab 01/12/22 1151 01/13/22 0513  INR 1.2 1.3*   Cardiac Enzymes: No results for input(s): CKTOTAL, CKMB, CKMBINDEX, TROPONINI in the last 168 hours. BNP (last 3 results) No results for input(s): PROBNP in the last 8760 hours. HbA1C: No  results for input(s): HGBA1C in the last 72 hours. CBG: No results for input(s): GLUCAP in the last 168 hours. Lipid Profile: No results for input(s): CHOL, HDL, LDLCALC, TRIG, CHOLHDL, LDLDIRECT in the last 72 hours. Thyroid Function Tests: No results for input(s): TSH, T4TOTAL, FREET4, T3FREE, THYROIDAB in the last 72 hours. Anemia Panel: No results for input(s): VITAMINB12, FOLATE, FERRITIN, TIBC, IRON, RETICCTPCT in the last 72 hours. Urine analysis:    Component Value Date/Time   COLORURINE YELLOW 01/12/2022 1151   APPEARANCEUR CLEAR 01/12/2022 1151   LABSPEC >1.030 (H) 01/12/2022 1151   PHURINE 5.5 01/12/2022 1151   GLUCOSEU NEGATIVE 01/12/2022 1151   HGBUR TRACE (A) 01/12/2022 1151   BILIRUBINUR SMALL (A) 01/12/2022 1151   KETONESUR TRACE (A) 01/12/2022 1151   PROTEINUR 30 (A) 01/12/2022 1151   NITRITE NEGATIVE 01/12/2022 1151   LEUKOCYTESUR SMALL (A) 01/12/2022 1151   Sepsis Labs: @LABRCNTIP (procalcitonin:4,lacticidven:4)  ) Recent Results (  from the past 240 hour(s))  Resp Panel by RT-PCR (Flu A&B, Covid) Nasopharyngeal Swab     Status: None   Collection Time: 01/12/22 11:51 AM   Specimen: Nasopharyngeal Swab; Nasopharyngeal(NP) swabs in vial transport medium  Result Value Ref Range Status   SARS Coronavirus 2 by RT PCR NEGATIVE NEGATIVE Final    Comment: (NOTE) SARS-CoV-2 target nucleic acids are NOT DETECTED.  The SARS-CoV-2 RNA is generally detectable in upper respiratory specimens during the acute phase of infection. The lowest concentration of SARS-CoV-2 viral copies this assay can detect is 138 copies/mL. A negative result does not preclude SARS-Cov-2 infection and should not be used as the sole basis for treatment or other patient management decisions. A negative result may occur with  improper specimen collection/handling, submission of specimen other than nasopharyngeal swab, presence of viral mutation(s) within the areas targeted by this assay, and  inadequate number of viral copies(<138 copies/mL). A negative result must be combined with clinical observations, patient history, and epidemiological information. The expected result is Negative.  Fact Sheet for Patients:  EntrepreneurPulse.com.au  Fact Sheet for Healthcare Providers:  IncredibleEmployment.be  This test is no t yet approved or cleared by the [place] FDA and  has been authorized for detection and/or diagnosis of SARS-CoV-2 by FDA under an Emergency Use Authorization (EUA). This EUA will remain  in effect (meaning this test can be used) for the duration of the COVID-19 declaration under Section 564(b)(1) of the Act, 21 U.S.C.section 360bbb-3(b)(1), unless the authorization is terminated  or revoked sooner.       Influenza A by PCR NEGATIVE NEGATIVE Final   Influenza B by PCR NEGATIVE NEGATIVE Final    Comment: (NOTE) The Xpert Xpress SARS-CoV-2/FLU/RSV plus assay is intended as an aid in the diagnosis of influenza from Nasopharyngeal swab specimens and should not be used as a sole basis for treatment. Nasal washings and aspirates are unacceptable for Xpert Xpress SARS-CoV-2/FLU/RSV testing.  Fact Sheet for Patients: EntrepreneurPulse.com.au  Fact Sheet for Healthcare Providers: IncredibleEmployment.be  This test is not yet approved or cleared by the [place] FDA and has been authorized for detection and/or diagnosis of SARS-CoV-2 by FDA under an Emergency Use Authorization (EUA). This EUA will remain in effect (meaning this test can be used) for the duration of the COVID-19 declaration under Section 564(b)(1) of the Act, 21 U.S.C. section 360bbb-3(b)(1), unless the authorization is terminated or revoked.  Performed at [facility], [address] [place], [place] [address]   Blood Culture (routine x 2)     Status: None (Preliminary result)   Collection Time:  01/12/22 11:51 AM   Specimen: BLOOD  Result Value Ref Range Status   Specimen Description BLOOD BLOOD LEFT WRIST  Final   Special Requests   Final    BOTTLES DRAWN AEROBIC AND ANAEROBIC Blood Culture adequate volume   Culture   Final    NO GROWTH < 24 HOURS Performed at [facility], [address] [place], [place] [address]    Report Status PENDING  Incomplete  Culture, blood (Routine X 2) w Reflex to ID Panel     Status: None (Preliminary result)   Collection Time: 01/13/22  5:13 AM   Specimen: BLOOD  Result Value Ref Range Status   Specimen Description BLOOD  Final   Special Requests NONE  Final   Culture   Final    NO GROWTH <12 HOURS Performed at [facility], [address] [place], [place] [address]  Report Status PENDING  Incomplete         Radiology Studies: CT ABDOMEN PELVIS WO CONTRAST  Result Date: 01/12/2022 CLINICAL DATA:  Abdominal pain, acute. EXAM: CT ABDOMEN AND PELVIS WITHOUT CONTRAST TECHNIQUE: Multidetector CT imaging of the abdomen and pelvis was performed following the standard protocol without IV contrast. RADIATION DOSE REDUCTION: This exam was performed according to the departmental dose-optimization program which includes automated exposure control, adjustment of the mA and/or kV according to patient size and/or use of iterative reconstruction technique. COMPARISON:  CT pelvis 12/09/2021.  Chest radiography same day. FINDINGS: Lower chest: Bilateral pleural effusions layering dependently with dependent atelectasis. Some areas of pulmonary scarring and some pleural emphysematous change related to chronic lung disease. The heart is enlarged. There are coronary artery calcifications. Hepatobiliary: Liver parenchyma is normal. Question sludge or small stones dependent in the gallbladder. Pancreas: Normal Spleen: Normal Adrenals/Urinary Tract: Adrenal glands are normal. Kidneys are normal. No cyst, mass, stone or hydronephrosis. Foley  catheter in the bladder. Stomach/Bowel: Stomach and small intestine are normal. Moderate amount of stool and gas within the colon but no acute finding. Vascular/Lymphatic: Aortic atherosclerosis. No aneurysm. IVC is normal. No adenopathy. Reproductive: Limited detail because of artifact from the hip replacement. No evidence of pelvic mass. Other: No free fluid or air. Musculoskeletal: Previous vertebral augmentations at T9 and L1. Lower lumbar degenerative changes. Previous hip replacement on the left. Fatty atrophy of the thigh musculature on right. IMPRESSION: No acute finding to explain abdominal pain. Bilateral pleural effusions layering dependently with dependent atelectasis. Chronic lung disease. Cardiomegaly, coronary artery calcification and aortic atherosclerotic calcification. Question small amount of sludge or small stones dependent in the gallbladder. Consider ultrasound correlation. Moderate amount of stool and gas in the colon, but not out of the range of normal. Electronically Signed   By: Nelson Chimes M.D.   On: 01/12/2022 15:53   DG Chest 1 View  Result Date: 01/12/2022 CLINICAL DATA:  Respiratory distress EXAM: CHEST  1 VIEW COMPARISON:  January 12, 2022 at 11:52 a.m. FINDINGS: Bilateral patchy pulmonary opacities persists but appears somewhat improved in the interval. The cardiomediastinal silhouette is stable. No pneumothorax. No other changes. IMPRESSION: Persistent but mildly improved bilateral patchy pulmonary infiltrates. Electronically Signed   By: Dorise Bullion III M.D.   On: 01/12/2022 13:58   DG Chest Port 1 View  Result Date: 01/12/2022 CLINICAL DATA:  76 year old female with possible sepsis. EXAM: PORTABLE CHEST 1 VIEW COMPARISON:  CTA chest 05/28/2019 and earlier. FINDINGS: Portable AP upright view at 1153 hours. Chronically reduced lung volumes with evidence of interstitial lung disease on 2020 CT. Multifocal bilateral patchy and confluent new pulmonary opacity since  radiographs in June 2020. No superimposed pneumothorax or pleural effusion. No definite consolidation. Previously augmented thoracic and lumbar compression fractures. Mediastinal contours remain normal. Visualized tracheal air column is within normal limits. No acute osseous abnormality identified. Negative visible bowel gas. IMPRESSION: Multifocal bilateral patchy lung opacity opacity superimposed on suspected chronic interstitial lung disease with diminished lung volumes. Favor acute bilateral pneumonia in the setting of suspected sepsis. No pleural effusion. Electronically Signed   By: Genevie Ann M.D.   On: 01/12/2022 12:07        Scheduled Meds:  atorvastatin  40 mg Oral QHS   budesonide (PULMICORT) nebulizer solution  0.5 mg Nebulization BID   calcitonin (salmon)  1 spray Alternating Nares Daily   DULoxetine  60 mg Oral Daily   ezetimibe  10 mg Oral QHS  hydroxychloroquine  200 mg Oral BID   iron polysaccharides  150 mg Oral Daily   latanoprost  1 drop Both Eyes QHS   multivitamin with minerals  1 tablet Oral Daily   Continuous Infusions:  azithromycin 500 mg (01/12/22 1639)   cefTRIAXone (ROCEPHIN)  IV 2 g (01/13/22 0100)     LOS: 1 day    Time spent: 81 min    Desma Maxim, MD [facility]   If 7PM-7AM, please contact night-coverage [email or web address] Password [facility] 01/13/2022, 8:38 AM

## 2022-01-13 NOTE — Evaluation (Signed)
Clinical/Bedside Swallow Evaluation Patient Details  Name: Erin [name] MRN: [id number] Date of Birth: Sep 04, 1946  Today's Date: 01/13/2022 Time: SLP Start Time (ACUTE ONLY): 0905 SLP Stop Time (ACUTE ONLY): 0920 SLP Time Calculation (min) (ACUTE ONLY): 15 min  Past Medical History:  Past Medical History:  Diagnosis Date   Anemia    h/o   Cancer ([place])    melanoma x 3the last one was of her leg and surgeon removed it   Colon polyp    Complication of anesthesia    PT STATED THAT WITH JOINT SURGERY THAT SHE WAS GIVEN SOME TYPE OF ANESTHESIA THAT MADE HER HALLUCINATE    Coronary artery calcification    Coronary artery disease    DDD (degenerative disc disease), lumbar    Diverticulosis 01/20/2017   Dysphagia    Dyspnea    with exertion-unable to walk a mile without getting sob- dr sparks set pt up to see Dr Rockey Situ after 11-19-16 surgery   Environmental allergies    Esophageal dysmotility    Fibrocystic breast disease    GERD (gastroesophageal reflux disease)    Heart murmur    asymptomatic   Hyperlipidemia    unspecified   Hypertension    Mitral regurgitation    Monilial esophagitis ([place]) 01/20/2017   Obstipation    Osteopenia    Peptic ulcer disease    Pulmonary fibrosis ([place])    per Dr Raul Del   RA (rheumatoid arthritis) ([place])    Raynaud's disease    Redundant colon 01/20/2017   Scleroderma ([place])    Telangiectasia of colon    Tubular adenoma of colon 01/20/2017   unspecified   Vulvar dysplasia    Past Surgical History:  Past Surgical History:  Procedure Laterality Date   ABDOMINAL HYSTERECTOMY     BREAST CYST EXCISION Left 20+ years ago   No scar visible   BREAST LUMPECTOMY Right    CARDIAC CATHETERIZATION     CARPAL TUNNEL RELEASE Bilateral    COLON SURGERY     colon polyp    COLONOSCOPY  09/30/2011   tubular adenoma rtm   COLONOSCOPY     05/02/2003, 04/10/2000   COLONOSCOPY WITH PROPOFOL N/A 01/20/2017   Procedure: COLONOSCOPY WITH PROPOFOL;  Surgeon:  Lollie Sails, MD;  Location: [facility] ENDOSCOPY;  Service: Endoscopy;  Laterality: N/A;   COLONOSCOPY WITH PROPOFOL N/A 09/17/2020   Procedure: COLONOSCOPY WITH PROPOFOL;  Surgeon: Lesly Rubenstein, MD;  Location: [facility] ENDOSCOPY;  Service: Endoscopy;  Laterality: N/A;   CORONARY ATHERECTOMY N/A 11/21/2020   Procedure: CORONARY ATHERECTOMY;  Surgeon: Wellington Hampshire, MD;  Location: [place] CV LAB;  Service: Cardiovascular;  Laterality: N/A;   CORONARY STENT INTERVENTION  11/21/2020   CORONARY STENT INTERVENTION N/A 11/21/2020   Procedure: CORONARY STENT INTERVENTION;  Surgeon: Wellington Hampshire, MD;  Location: [place] [facility] LAB;  Service: Cardiovascular;  Laterality: N/A;   ESOPHAGOGASTRODUODENOSCOPY     ESOPHAGOGASTRODUODENOSCOPY     09/30/2011, 04/10/2000 , no repeat rtm   ESOPHAGOGASTRODUODENOSCOPY N/A 03/02/2018   Procedure: ESOPHAGOGASTRODUODENOSCOPY (EGD);  Surgeon: Virgel Manifold, MD;  Location: [facility] ENDOSCOPY;  Service: Endoscopy;  Laterality: N/A;   ESOPHAGOGASTRODUODENOSCOPY (EGD) WITH PROPOFOL N/A 01/20/2017   Procedure: ESOPHAGOGASTRODUODENOSCOPY (EGD) WITH PROPOFOL;  Surgeon: Lollie Sails, MD;  Location: [facility] ENDOSCOPY;  Service: Endoscopy;  Laterality: N/A;   ESOPHAGOGASTRODUODENOSCOPY (EGD) WITH PROPOFOL N/A 09/17/2020   Procedure: ESOPHAGOGASTRODUODENOSCOPY (EGD) WITH PROPOFOL;  Surgeon: Lesly Rubenstein, MD;  Location: [facility] ENDOSCOPY;  Service: Endoscopy;  Laterality:  N/A;   EXCISION HYDRADENITIS LABIA Left 11/19/2016   Procedure: EXCISION LABIAL MASS;  Surgeon: Robert Bellow, MD;  Location: [facility] ORS;  Service: General;  Laterality: Left;   EYE SURGERY Bilateral    cataracts   HEEL SPUR EXCISION     HIP ARTHROPLASTY Left 02/23/2018   Procedure: ARTHROPLASTY BIPOLAR HIP (HEMIARTHROPLASTY);  Surgeon: Thornton Park, MD;  Location: [facility] ORS;  Service: Orthopedics;  Laterality: Left;   IR KYPHO LUMBAR INC FX REDUCE BONE BX UNI/BIL CANNULATION INC/IMAGING   12/16/2021   KYPHOPLASTY N/A 04/26/2019   Procedure: KYPHOPLASTY T7;  Surgeon: Hessie Knows, MD;  Location: [facility] ORS;  Service: Orthopedics;  Laterality: N/A;   KYPHOPLASTY N/A 05/17/2019   Procedure: KYPHOPLASTY T8;  Surgeon: Hessie Knows, MD;  Location: [facility] ORS;  Service: Orthopedics;  Laterality: N/A;   KYPHOPLASTY N/A 07/26/2019   Procedure: T-9 KYPHOPLASTY;  Surgeon: Hessie Knows, MD;  Location: [facility] ORS;  Service: Orthopedics;  Laterality: N/A;   MASS EXCISION Left 11/19/2016   Procedure: EXCISION LEFT THIGH MELANOMA;  Surgeon: Robert Bellow, MD;  Location: [facility] ORS;  Service: General;  Laterality: Left;   RIGHT/LEFT HEART CATH AND CORONARY ANGIOGRAPHY N/A 11/15/2020   Procedure: RIGHT/LEFT HEART CATH AND CORONARY ANGIOGRAPHY poss intervention;  Surgeon: Minna Merritts, MD;  Location: [place] [facility] LAB;  Service: Cardiovascular;  Laterality: N/A;   SENTINEL NODE BIOPSY Left 11/19/2016   Procedure: INGUINAL SENTINEL NODE BIOPSY;  Surgeon: Robert Bellow, MD;  Location: [facility] ORS;  Service: General;  Laterality: Left;   UPPER ESOPHAGEAL ENDOSCOPIC ULTRASOUND (EUS) N/A 01/29/2017   Procedure: UPPER ESOPHAGEAL ENDOSCOPIC ULTRASOUND (EUS);  Surgeon: Holly Bodily, MD;  Location: [facility] ENDOSCOPY;  Service: Endoscopy;  Laterality: N/A;   HPI:  Per 71 H&P ": Erin [name] is a 76 y.o. female with medical history significant for hypertension, coronary artery disease, GERD who was sent from the assisted living facility where she currently resides to the ER for evaluation of fever and hypoxia.  Patient was noted to have a Tmax of 100.1 at the skilled nursing facility and room air pulse oximetry was in the 80s.  She was placed on 6 L of oxygen with improvement in her pulse oximetry to 93%.  She is oriented to person and place but at baseline is usually awake, alert and oriented x3.  Patient's son states that she was recently hospitalized following a fall and at that time was found  to have an L1 compression fracture status post kyphoplasty by interventional radiology.  Her hospital course was complicated by urinary retention and she was discharged to subacute rehab with a Foley catheter.  Foley catheter was removed on 01/09/22 by urology but had to be replaced same day because of voiding difficulty.  Patient has been on Keflex prescribed by urology for suspected UTI.  She received broad-spectrum antibiotics, Flagyl, vancomycin and cefepime in the ER for sepsis from multifocal pneumonia." CXR 01/12/22 "Persistent but mildly improved bilateral patchy pulmonary  infiltrates."    Assessment / Plan / Recommendation  Clinical Impression  Pt seen for clinical swallowing evaluation. Pt alert, pleasant, and cooperative. Endorses globus sensation with solids PTA. When asked to localize, pt pointed circa the thoracic inlet. Denies s/sx pharyngeal dysphagia. On 2L/min O2 via [place]. Wears dentures, although not present for evaluation. Generalized lingual weakness noted on oral motor examination. Pt known to SLP services from previous admission. Most recently, recommendation for mech soft diet with thin liquids in 2019. Cleared with RN.  Pt given trials of pureed and thin liquids. Solids deferred at this time. Oral phase was functional for consistencies given. No overt or subtle s/sx pharyngeal dysphagia. To palpation, pt with seemingly timely swallow initiation and seemingly adequate laryngeal elevation. No change to vocal quality appreciated. Of note, concern for suspected esophageal dysphagia. Pt observed belching intermittently with thin liquids. Pt with c/o globus sensation with solids which was not reproduced on evaluation as solid trials deferred at this time due to pt complaints and dentures not being present.   Per chart review, pt with hx of esophageal dysmotility, GERD, and multipled EGDs. EGD in 2021 noted "fluid filled lower third of the esophagus..." and recommended "esophageal manometry  to evaluate for dysmotility." Recommend initiation of a pureed diet with thin liquids and safe swallowing strategies, aspiration precautions, and reflux precautions. Consider further GI work up, as appropriate.   SLP to f/u for diet tolerance and trials of upgraded textures, as appropriate.  Pt and RN made aware of results, recommendations, and SLP POC. Pt verbalized understanding/agreement.   SLP Visit Diagnosis: Dysphagia, oral phase (R13.11);Dysphagia, pharyngoesophageal phase (R13.14)    Aspiration Risk  Mild aspiration risk;Moderate aspiration risk    Diet Recommendation Dysphagia 1 (Puree);Thin liquid   Medication Administration: Crushed with puree Supervision: Staff to assist with self feeding Compensations: Minimize environmental distractions;Slow rate;Small sips/bites (reflux precautions) Postural Changes: Seated upright at 90 degrees;Remain upright for at least 30 minutes after po intake    Other  Recommendations Recommended Consults: Consider GI evaluation;Consider esophageal assessment Oral Care Recommendations: Oral care BID;Oral care before and after PO;Staff/trained caregiver to provide oral care    Recommendations for follow up therapy are one component of a multi-disciplinary discharge planning process, led by the attending physician.  Recommendations may be updated based on patient status, additional functional criteria and insurance authorization.  Follow up Recommendations  (TBD)      Assistance Recommended at Discharge Intermittent Supervision/Assistance  Functional Status Assessment Patient has had a recent decline in their functional status and demonstrates the ability to make significant improvements in function in a reasonable and predictable amount of time.  Frequency and Duration min 2x/week  2 weeks       Prognosis Prognosis for Safe Diet Advancement: Fair Barriers to Reach Goals: Severity of deficits      Swallow Study   General Date of Onset:  01/12/22 HPI: Per 74 H&P ": DULCEMARIA BULA is a 76 y.o. female with medical history significant for hypertension, coronary artery disease, GERD who was sent from the assisted living facility where she currently resides to the ER for evaluation of fever and hypoxia.  Patient was noted to have a Tmax of 100.1 at the skilled nursing facility and room air pulse oximetry was in the 80s.  She was placed on 6 L of oxygen with improvement in her pulse oximetry to 93%.  She is oriented to person and place but at baseline is usually awake, alert and oriented x3.  Patient's son states that she was recently hospitalized following a fall and at that time was found to have an L1 compression fracture status post kyphoplasty by interventional radiology.  Her hospital course was complicated by urinary retention and she was discharged to subacute rehab with a Foley catheter.  Foley catheter was removed on 01/09/22 by urology but had to be replaced same day because of voiding difficulty.  Patient has been on Keflex prescribed by urology for suspected UTI.  She received broad-spectrum antibiotics, Flagyl, vancomycin  and cefepime in the ER for sepsis from multifocal pneumonia." CXR 01/12/22 "Persistent but mildly improved bilateral patchy pulmonary  infiltrates." Type of Study: Bedside Swallow Evaluation Previous Swallow Assessment: clinical swallowing evlaution 2019 recommended a mech soft diet with thin liquids Diet Prior to this Study: NPO Temperature Spikes Noted: No Respiratory Status: Nasal cannula (2L/min) History of Recent Intubation: No Behavior/Cognition: Alert;Cooperative;Pleasant mood Oral Care Completed by SLP: Yes Oral Cavity - Dentition: Dentures, not available Vision: Functional for self-feeding Self-Feeding Abilities: Able to feed self;Needs set up;Needs assist (assistance with feeding PRN)    Oral/Motor/Sensory Function Overall Oral Motor/Sensory Function: Generalized oral weakness Lingual  Strength: Reduced (bilaterally)   Ice Chips     Thin Liquid Thin Liquid: Within functional limits Presentation: Self Fed;Cup;Straw Other Comments: ~6 oz    Puree Puree: Within functional limits Presentation: Self Fed Other Comments: ~2 oz      Cherrie Gauze, M.S., [facility] [phone] [place])   Quintella Baton 01/13/2022,11:28 AM

## 2022-01-13 NOTE — Consult Note (Signed)
Pharmacy Antibiotic Note  NOU CHARD is a 76 y.o. female admitted on 01/12/2022 with pneumonia.  Pharmacy has been consulted for cefepime dosing.  Plan: Started cefepime 2 g q12H  Height: 5' (152.4 cm) Weight: 63.5 kg (140 lb) IBW/kg (Calculated) : 45.5  Temp (24hrs), Avg:99.2 F (37.3 C), Min:98 F (36.7 C), Max:101.7 F (38.7 C)  Recent Labs  Lab 01/12/22 1151 01/12/22 1456 01/13/22 0513  WBC 15.5*  --  13.5*  CREATININE 1.05*  --  0.75  LATICACIDVEN 1.3 0.9  --     Estimated Creatinine Clearance: 49.8 mL/min (by C-G formula based on SCr of 0.75 mg/dL).    Allergies  Allergen Reactions   Remicade [Infliximab] Shortness Of Breath and Itching   Sulfa Antibiotics Other (See Comments)    Other Reaction: tounge cracked   Actonel [Risedronate Sodium] Other (See Comments)   Fosamax [Alendronate Sodium]     GI Bleed    Procardia [Nifedipine] Hives   Elemental Sulfur Swelling   Wellbutrin [Bupropion] Anxiety    Antimicrobials this admission: 2/5 cefepime >>  2/6 ceftraixone x 1  Microbiology results: 2/5 BCx: pending  Thank you for allowing pharmacy to be a part of this patients care.  Oswald Hillock, PharmD, BCPS 01/13/2022 9:25 AM

## 2022-01-14 ENCOUNTER — Inpatient Hospital Stay: Payer: Medicare Other

## 2022-01-14 DIAGNOSIS — A419 Sepsis, unspecified organism: Secondary | ICD-10-CM | POA: Diagnosis not present

## 2022-01-14 DIAGNOSIS — J9601 Acute respiratory failure with hypoxia: Secondary | ICD-10-CM | POA: Diagnosis not present

## 2022-01-14 DIAGNOSIS — R6520 Severe sepsis without septic shock: Secondary | ICD-10-CM | POA: Diagnosis not present

## 2022-01-14 LAB — BASIC METABOLIC PANEL
Anion gap: 9 (ref 5–15)
BUN: 8 mg/dL (ref 8–23)
CO2: 23 mmol/L (ref 22–32)
Calcium: 7.9 mg/dL — ABNORMAL LOW (ref 8.9–10.3)
Chloride: 100 mmol/L (ref 98–111)
Creatinine, Ser: 0.5 mg/dL (ref 0.44–1.00)
GFR, Estimated: >60 mL/min (ref >60)
Glucose, Bld: 66 mg/dL — ABNORMAL LOW (ref 70–99)
Potassium: 3.4 mmol/L — ABNORMAL LOW (ref 3.5–5.1)
Sodium: 132 mmol/L — ABNORMAL LOW (ref 135–145)

## 2022-01-14 LAB — TYPE AND SCREEN
ABO/RH(D): O POS
Antibody Screen: NEGATIVE
Unit division: 0

## 2022-01-14 LAB — CBC
HCT: 27.2 % — ABNORMAL LOW (ref 36.0–46.0)
Hemoglobin: 8.8 g/dL — ABNORMAL LOW (ref 12.0–15.0)
MCH: 27.2 pg (ref 26.0–34.0)
MCHC: 32.4 g/dL (ref 30.0–36.0)
MCV: 84.2 fL (ref 80.0–100.0)
Platelets: 649 10*3/uL — ABNORMAL HIGH (ref 150–400)
RBC: 3.23 MIL/uL — ABNORMAL LOW (ref 3.87–5.11)
RDW: 15.9 % — ABNORMAL HIGH (ref 11.5–15.5)
WBC: 13 10*3/uL — ABNORMAL HIGH (ref 4.0–10.5)
nRBC: 0 % (ref 0.0–0.2)

## 2022-01-14 LAB — RESPIRATORY PANEL BY PCR

## 2022-01-14 LAB — BPAM RBC
Blood Product Expiration Date: 202303072359
ISSUE DATE / TIME: 202302061201
Unit Type and Rh: 5100

## 2022-01-14 LAB — LEGIONELLA PNEUMOPHILA SEROGP 1 UR AG: L. pneumophila Serogp 1 Ur Ag: NEGATIVE

## 2022-01-14 LAB — C-REACTIVE PROTEIN: CRP: 24.5 mg/dL — ABNORMAL HIGH (ref <1.0)

## 2022-01-14 LAB — MAGNESIUM: Magnesium: 1.6 mg/dL — ABNORMAL LOW (ref 1.7–2.4)

## 2022-01-14 LAB — PROCALCITONIN: Procalcitonin: 0.12 ng/mL

## 2022-01-14 MED ORDER — ENOXAPARIN SODIUM 40 MG/0.4ML IJ SOSY
40.0000 mg | PREFILLED_SYRINGE | INTRAMUSCULAR | Status: DC
  Administered 2022-01-14 – 2022-01-17 (×4): 40 mg via SUBCUTANEOUS
  Filled 2022-01-14 (×4): qty 0.4

## 2022-01-14 MED ORDER — ALPRAZOLAM 0.5 MG PO TABS
0.5000 mg | ORAL_TABLET | Freq: Two times a day (BID) | ORAL | Status: DC | PRN
  Administered 2022-01-16: 21:00:00 0.5 mg via ORAL
  Filled 2022-01-14 (×2): qty 1

## 2022-01-14 MED ORDER — ENSURE ENLIVE PO LIQD
237.0000 mL | Freq: Two times a day (BID) | ORAL | Status: DC
  Administered 2022-01-14 – 2022-01-17 (×6): 237 mL via ORAL

## 2022-01-14 MED ORDER — POTASSIUM CHLORIDE CRYS ER 20 MEQ PO TBCR
40.0000 meq | EXTENDED_RELEASE_TABLET | Freq: Once | ORAL | Status: AC
  Administered 2022-01-14: 40 meq via ORAL
  Filled 2022-01-14: qty 2

## 2022-01-14 MED ORDER — PANTOPRAZOLE SODIUM 40 MG IV SOLR
40.0000 mg | Freq: Two times a day (BID) | INTRAVENOUS | Status: DC
  Administered 2022-01-14 – 2022-01-17 (×7): 40 mg via INTRAVENOUS
  Filled 2022-01-14 (×7): qty 10

## 2022-01-14 NOTE — TOC Initial Note (Addendum)
Transition of Care [facility]) - Initial/Assessment Note    Patient Details  Name: Erin [name] MRN: [id number] Date of Birth: 1946/10/23  Transition of Care [facility]) CM/SW Contact:    Alberteen Sam, LCSW Phone Number: 01/14/2022, 12:15 PM  Clinical Narrative:                  Update: patient's family requested to speak to CSW, CSW went to room in which patient reported being unsure if she wanted to pursue rehab or go back home. Hospice was mentioned, CSW has requested palliative consult from MD for patient and family to continue to discuss her wishes and dispo planning.      CSW spoke with patient and cousin at bedside, patient is from [facility] and agreeable to PT recs of SNF.   Patient has been to [facility] in the past and would prefer to go to a different facility, agreeable for CSW to fax out referrals with preference of [facility].   [facility] and [facility]/22 [facility].  [facility] requesting vaccination status, patient reports she has had 4 Moderna vaccines, CSW has updated Seth Bake at [facility].   Per Seth Bake at [facility], they report they have no beds until next Monday. If anything changes with patient's ETA for discharge then they can re assess.   CSW has sent referrals pending bed offers at this time.    Expected Discharge Plan: Skilled Nursing Facility Barriers to Discharge: Continued Medical Work up   Patient Goals and CMS Choice Patient states their goals for this hospitalization and ongoing recovery are:: to go home CMS Medicare.gov Compare Post Acute Care list provided to:: Patient Choice offered to / list presented to : Patient  Expected Discharge Plan and Services Expected Discharge Plan: [place]                                              Prior Living Arrangements/Services   Lives with:: Self, Facility Resident ([facility])                   Activities of Daily Living       Permission Sought/Granted                  Emotional Assessment Appearance:: Appears stated age Attitude/Demeanor/Rapport: Gracious Affect (typically observed): Calm Orientation: : Oriented to Self, Oriented to Place, Oriented to  Time, Oriented to Situation Alcohol / Substance Use: Not Applicable Psych Involvement: No (comment)  Admission diagnosis:  Sepsis ([place]) [A41.9] Sepsis, due to unspecified organism, unspecified whether acute organ dysfunction present [facility]) [A41.9] Patient Active Problem List   Diagnosis Date Noted   Sepsis ([place]) 01/12/2022   Acute respiratory failure ([place])    Acute metabolic encephalopathy    Closed compression fracture of body of L1 vertebra (HCC) 12/09/2021   Chronic diastolic CHF (congestive heart failure) ([place]) 12/09/2021   Depression with anxiety 12/09/2021   Normocytic anemia    Fall at home, initial encounter    Angina pectoris ([place]) 11/21/2020   Unstable angina ([place]) 10/30/2020   Bilateral carotid artery stenosis 10/30/2020   Colon polyp 08/31/2019   [name] S/P percutaneous coronary angioplasty 08/31/2019   Degenerative disc disease, lumbar 08/31/2019   Dysphagia 08/31/2019   Environmental allergies 08/31/2019   Esophageal dysmotility 08/31/2019   Fibrocystic breast disease 08/31/2019  Mitral regurgitation 08/31/2019   Acute respiratory disease due to COVID-19 virus 05/28/2019   Food in esophagus causing other injury, initial encounter 04/21/2019   Gastro-esophageal reflux disease with esophagitis 04/21/2019   Gastric nodule 04/21/2019   Varicose veins of leg with swelling, bilateral 23/55/7322   Acute diastolic CHF (congestive heart failure) ([place]) 03/18/2018   Lower extremity edema 03/18/2018   Age-related osteoporosis with current pathological fracture 03/04/2018   Anemia 02/28/2018   S/P hip hemiarthroplasty 02/26/2018   Hip fracture ([place]) 02/22/2018   Lumbar radiculopathy 01/20/2018   Polyneuropathy 01/20/2018   Chronic  insomnia 01/15/2018   Hyperlipidemia 10/26/2017   Coronary artery disease of native artery of native heart with stable angina pectoris ([place]) 10/24/2017   Vulvar irritation 09/02/2017   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) 12/16/2016   SOB (shortness of breath) 12/15/2016   Idiopathic interstitial fibrosis of lung syndrome ([place]) 12/15/2016   Raynaud's disease 12/15/2016   Melanoma of skin ([place]) 11/11/2016   Lesion of labia 11/11/2016   HTN, goal below 140/90 08/29/2014   Encounter for long-term current use of medication 03/30/2014   Scleroderma ([place]) 03/15/2014   Rheumatoid arthritis ([place] ) 03/15/2014   Osteopenia 03/15/2014   PCP:  Idelle Crouch, MD Pharmacy:   [place], [place] [address] [place] [address] Phone: [phone] Fax: [phone]  [place], [address] [place] [address] Phone: [phone] Fax: [place], [address] [place] [address] [place] [address] Phone: [phone] Fax: [phone]     Social Determinants of Health (SDOH) Interventions    Readmission Risk Interventions Readmission Risk Prevention Plan 05/29/2019  Transportation Screening Not Complete  Transportation Screening Comment Pt was independent and driving prior to getting sick, when she become sick her cousin provided transportation however cousin is now sick - will need to address closer to [place] or Home Care Consult Patient refused  Medication Review (RN Care Manager) Complete  Some recent data might be hidden

## 2022-01-14 NOTE — Progress Notes (Signed)
PROGRESS NOTE    Erin [name]  OVF:[id number] DOB: 12-26-1945 DOA: 01/12/2022 PCP: Idelle Crouch, MD  Outpatient Specialists: pulmonology, rheumatology, cardiology, urology    Brief Narrative:   From admission h and p Erin [name] is a 76 y.o. female with medical history significant for hypertension, coronary artery disease, GERD who was sent from the assisted living facility where she currently resides to the ER for evaluation of fever and hypoxia. Patient was noted to have a Tmax of 100.1 at the skilled nursing facility and room air pulse oximetry was in the 80s.  She was placed on 6 L of oxygen with improvement in her pulse oximetry to 93%. She is oriented to person and place but at baseline is usually awake, alert and oriented x3. Patient's son states that she was recently hospitalized following a fall and at that time was found to have an L1 compression fracture status post kyphoplasty by interventional radiology.  Her hospital course was complicated by urinary retention and she was discharged to subacute rehab with a Foley catheter. Foley catheter was removed on 01/09/22 by urology but had to be replaced same day because of voiding difficulty. Patient has been on Keflex prescribed by urology for suspected UTI. 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 CXR showing patchy lung capacities read as superimposed infection. This on report of new o2 requirement at living facility. No neck  stiffness/pain to suggest meningitis. Improving. Mrsa swab neg - continue [facility] O2 - sputum for culture if can produce - urine antigens, blood cultures - continue cefepime (cefepime given ILD) - pulm following, has started steroids  # Anemia Hgb 6.9 from baseline of around 9. No report of melena or hematochezia, ct of abd/pelvis w/o signs bleeding, guaiac in ED reportedly negative. Is on asa/plavix for hx stent in 2021. Transfused 1 unit and hgb today much improved to 8.8. patient reports history of gastric ulcers though I don't see that mentioned in most recent gi progress note - start ppi - monitor - would consult GI if hgb drops - continuing home aspirin/plavix for now  # Recent L1 kyphoplasty Here febrile. Likely 2/2 CAP. MRI w/o signs infection  # [name] # HFpEF S/p stent in 2021. No chest pain, trops very mildly elevated and flat, do not think acs - cont home statin, zetia - hold aspirin/plavix for now - cont home imdur, metop - hold home lasix which takes EOD  # Chronic pain - home duloxetine - give gabapentin at reduced dose of 300 tid  # RA - cont home plaquenil  # HTN Here bp low normal - hold home amlodipine  # Hyponatremia Chronic, stable - monitor  # Urinary retention New problem at last hospitalization. Failed TOV few days prior to this admission. - maintain foley - f/u urine culture - outpt urology f/u  # Acute metabolic encephalopathy Appears to be resolved   DVT prophylaxis: lovenox Code Status: DNR, confirmed with POA  Family Communication: hcpoa Reid updated 2/7  Level of care: Progressive  Status is: Inpatient Remains inpatient appropriate because: severity of illness    Consultants:  none  Procedures: none  Antimicrobials:  cefepime    Subjective: Sob improving. No vomiting or diarrhea. No chest pain  Objective: Vitals:   01/14/22 0516 01/14/22 0719 01/14/22 0755 01/14/22 1048  BP: (!) 121/54  (!) 126/59 108/61  Pulse: 85  80  71  Resp: 18  20 20   Temp: 98.8 F (37.1 C)  98.2 F (36.8 C) 97.7 F (36.5 C)  TempSrc:      SpO2: 99% 97% 95% 100%  Weight:      Height:        Intake/Output Summary (Last 24 hours) at 01/14/2022 1057 Last data filed at 01/14/2022 1025 Gross per 24 hour  Intake 680 ml  Output 2800 ml  Net -2120 ml   Filed Weights   01/12/22 1132  Weight: 63.5 kg    Examination:  General exam: Appears calm and comfortable  Respiratory system: faint rales throughout improving Cardiovascular system: S1 & S2 heard, RRR. No JVD, murmurs, rubs, gallops or clicks.  Gastrointestinal system: Abdomen is nondistended, soft and nontender. No organomegaly or masses felt. Normal bowel sounds heard. Central nervous system: Alert and oriented to self. Moving all 4 ext Extremities: Symmetric 5 x 5 power. Trace pedal edema Skin: No rashes, lesions or ulcers Psychiatry: Judgement and insight appear normal. Mood & affect appropriate.     Data Reviewed: I have personally reviewed following labs and imaging studies  CBC: Recent Labs  Lab 01/12/22 1151 01/13/22 0513 01/14/22 0508  WBC 15.5* 13.5* 13.0*  NEUTROABS 12.5*  --   --   HGB 7.7* 6.9* 8.8*  HCT 24.6* 22.3* 27.2*  MCV 83.1 82.0 84.2  PLT 731* 658* 081*   Basic Metabolic Panel: Recent Labs  Lab 01/12/22 1151 01/13/22 0513 01/14/22 0508  NA 129* 131* 132*  K 4.1 3.7 3.4*  CL 99 102 100  CO2 23 21* 23  GLUCOSE 88 56* 66*  BUN 14 11 8   CREATININE 1.05* 0.75 0.50  CALCIUM 8.0* 7.9* 7.9*   GFR: Estimated Creatinine Clearance: 49.8 mL/min (by C-G formula based on SCr of 0.5 mg/dL). Liver Function Tests: Recent Labs  Lab 01/12/22 1151  AST 34  ALT 21  ALKPHOS 88  BILITOT 0.5  PROT 5.6*  ALBUMIN 2.4*   No results for input(s): LIPASE, AMYLASE in the last 168 hours. No results for input(s): AMMONIA in the last 168 hours. Coagulation Profile: Recent Labs  Lab 01/12/22 1151 01/13/22 0513  INR 1.2 1.3*   Cardiac Enzymes: No  results for input(s): CKTOTAL, CKMB, CKMBINDEX, TROPONINI in the last 168 hours. BNP (last 3 results) No results for input(s): PROBNP in the last 8760 hours. HbA1C: No results for input(s): HGBA1C in the last 72 hours. CBG: No results for input(s): GLUCAP in the last 168 hours. Lipid Profile: No results for input(s): CHOL, HDL, LDLCALC, TRIG, CHOLHDL, LDLDIRECT in the last 72 hours. Thyroid Function Tests: No results for input(s): TSH, T4TOTAL, FREET4, T3FREE, THYROIDAB in the last 72 hours. Anemia Panel: No results for input(s): VITAMINB12, FOLATE, FERRITIN, TIBC, IRON, RETICCTPCT in the last 72 hours. Urine analysis:    Component Value Date/Time   COLORURINE YELLOW 01/12/2022 1151   APPEARANCEUR CLEAR 01/12/2022 1151   LABSPEC >1.030 (H) 01/12/2022 1151   PHURINE 5.5 01/12/2022 1151   GLUCOSEU NEGATIVE 01/12/2022 1151   HGBUR TRACE (A) 01/12/2022 1151   BILIRUBINUR SMALL (A) 01/12/2022 1151   KETONESUR TRACE (  A) 01/12/2022 1151   PROTEINUR 30 (A) 01/12/2022 1151   NITRITE NEGATIVE 01/12/2022 1151   LEUKOCYTESUR SMALL (A) 01/12/2022 1151   Sepsis Labs: @LABRCNTIP (procalcitonin:4,lacticidven:4)  ) Recent Results (from the past 240 hour(s))  Resp Panel by RT-PCR (Flu A&B, Covid) Nasopharyngeal Swab     Status: None   Collection Time: 01/12/22 11:51 AM   Specimen: Nasopharyngeal Swab; Nasopharyngeal(NP) swabs in vial transport medium  Result Value Ref Range Status   SARS Coronavirus 2 by RT PCR NEGATIVE NEGATIVE Final    Comment: (NOTE) SARS-CoV-2 target nucleic acids are NOT DETECTED.  The SARS-CoV-2 RNA is generally detectable in upper respiratory specimens during the acute phase of infection. The lowest concentration of SARS-CoV-2 viral copies this assay can detect is 138 copies/mL. A negative result does not preclude SARS-Cov-2 infection and should not be used as the sole basis for treatment or other patient management decisions. A negative result may occur with   improper specimen collection/handling, submission of specimen other than nasopharyngeal swab, presence of viral mutation(s) within the areas targeted by this assay, and inadequate number of viral copies(<138 copies/mL). A negative result must be combined with clinical observations, patient history, and epidemiological information. The expected result is Negative.  Fact Sheet for Patients:  EntrepreneurPulse.com.au  Fact Sheet for Healthcare Providers:  IncredibleEmployment.be  This test is no t yet approved or cleared by the [place] FDA and  has been authorized for detection and/or diagnosis of SARS-CoV-2 by FDA under an Emergency Use Authorization (EUA). This EUA will remain  in effect (meaning this test can be used) for the duration of the COVID-19 declaration under Section 564(b)(1) of the Act, 21 U.S.C.section 360bbb-3(b)(1), unless the authorization is terminated  or revoked sooner.       Influenza A by PCR NEGATIVE NEGATIVE Final   Influenza B by PCR NEGATIVE NEGATIVE Final    Comment: (NOTE) The Xpert Xpress SARS-CoV-2/FLU/RSV plus assay is intended as an aid in the diagnosis of influenza from Nasopharyngeal swab specimens and should not be used as a sole basis for treatment. Nasal washings and aspirates are unacceptable for Xpert Xpress SARS-CoV-2/FLU/RSV testing.  Fact Sheet for Patients: EntrepreneurPulse.com.au  Fact Sheet for Healthcare Providers: IncredibleEmployment.be  This test is not yet approved or cleared by the [place] FDA and has been authorized for detection and/or diagnosis of SARS-CoV-2 by FDA under an Emergency Use Authorization (EUA). This EUA will remain in effect (meaning this test can be used) for the duration of the COVID-19 declaration under Section 564(b)(1) of the Act, 21 U.S.C. section 360bbb-3(b)(1), unless the authorization is terminated  or revoked.  Performed at [facility], [address] [place], [place] [address]   Blood Culture (routine x 2)     Status: None (Preliminary result)   Collection Time: 01/12/22 11:51 AM   Specimen: BLOOD  Result Value Ref Range Status   Specimen Description BLOOD BLOOD LEFT WRIST  Final   Special Requests   Final    BOTTLES DRAWN AEROBIC AND ANAEROBIC Blood Culture adequate volume   Culture   Final    NO GROWTH < 24 HOURS Performed at [facility], [address], [place], [place] [address]    Report Status PENDING  Incomplete  Urine Culture     Status: None   Collection Time: 01/12/22 11:51 AM   Specimen: Urine, Random  Result Value Ref Range Status   Specimen Description   Final    URINE, RANDOM Performed at [facility],  [place], [place] [address]    Special Requests   Final    NONE Performed at [facility], [address] [place], [place] [address]    Culture   Final    NO GROWTH Performed at [facility], [place] [address] [place], [place] [address]    Report Status 01/13/2022 FINAL  Final  Respiratory (~20 pathogens) panel by PCR     Status: None   Collection Time: 01/12/22 11:51 AM   Specimen: Nasopharyngeal Swab; Respiratory  Result Value Ref Range Status   Adenovirus NOT DETECTED NOT DETECTED Final   Coronavirus 229E NOT DETECTED NOT DETECTED Final    Comment: (NOTE) The Coronavirus on the Respiratory Panel, DOES NOT test for the novel  Coronavirus (2019 nCoV)    Coronavirus HKU1 NOT DETECTED NOT DETECTED Final   Coronavirus NL63 NOT DETECTED NOT DETECTED Final   Coronavirus OC43 NOT DETECTED NOT DETECTED Final   Metapneumovirus NOT DETECTED NOT DETECTED Final   Rhinovirus / Enterovirus NOT DETECTED NOT DETECTED Final   Influenza A NOT DETECTED NOT DETECTED Final   Influenza B NOT DETECTED NOT DETECTED Final   Parainfluenza Virus 1 NOT DETECTED NOT DETECTED Final   Parainfluenza Virus 2 NOT  DETECTED NOT DETECTED Final   Parainfluenza Virus 3 NOT DETECTED NOT DETECTED Final   Parainfluenza Virus 4 NOT DETECTED NOT DETECTED Final   Respiratory Syncytial Virus NOT DETECTED NOT DETECTED Final   Bordetella pertussis NOT DETECTED NOT DETECTED Final   Bordetella Parapertussis NOT DETECTED NOT DETECTED Final   Chlamydophila pneumoniae NOT DETECTED NOT DETECTED Final   Mycoplasma pneumoniae NOT DETECTED NOT DETECTED Final    Comment: Performed at [facility], [place]. [address], [place], [place] [address]  Culture, blood (Routine X 2) w Reflex to ID Panel     Status: None (Preliminary result)   Collection Time: 01/13/22  5:13 AM   Specimen: BLOOD  Result Value Ref Range Status   Specimen Description BLOOD  Final   Special Requests NONE  Final   Culture   Final    NO GROWTH <12 HOURS Performed at [facility], [address] [place], [place] [address]    Report Status PENDING  Incomplete  MRSA Next Gen by PCR, Nasal     Status: None   Collection Time: 01/13/22  9:41 AM   Specimen: Nasal Mucosa; Nasal Swab  Result Value Ref Range Status   MRSA by PCR Next Gen NOT DETECTED NOT DETECTED Final    Comment: (NOTE) The GeneXpert MRSA Assay (FDA approved for NASAL specimens only), is one component of a comprehensive MRSA colonization surveillance program. It is not intended to diagnose MRSA infection nor to guide or monitor treatment for MRSA infections. Test performance is not FDA approved in patients less than 62 years old. Performed at [facility], [address] [place],  [address]          Radiology Studies: CT ABDOMEN PELVIS WO CONTRAST  Result Date: 01/12/2022 CLINICAL DATA:  Abdominal pain, acute. EXAM: CT ABDOMEN AND PELVIS WITHOUT CONTRAST TECHNIQUE: Multidetector CT imaging of the abdomen and pelvis was performed following the standard protocol without IV contrast. RADIATION DOSE REDUCTION: This exam was performed according to the  departmental dose-optimization program which includes automated exposure control, adjustment of the mA and/or kV according to patient size and/or use of iterative reconstruction technique. COMPARISON:  CT pelvis 12/09/2021.  Chest radiography same day. FINDINGS: Lower chest: Bilateral  pleural effusions layering dependently with dependent atelectasis. Some areas of pulmonary scarring and some pleural emphysematous change related to chronic lung disease. The heart is enlarged. There are coronary artery calcifications. Hepatobiliary: Liver parenchyma is normal. Question sludge or small stones dependent in the gallbladder. Pancreas: Normal Spleen: Normal Adrenals/Urinary Tract: Adrenal glands are normal. Kidneys are normal. No cyst, mass, stone or hydronephrosis. Foley catheter in the bladder. Stomach/Bowel: Stomach and small intestine are normal. Moderate amount of stool and gas within the colon but no acute finding. Vascular/Lymphatic: Aortic atherosclerosis. No aneurysm. IVC is normal. No adenopathy. Reproductive: Limited detail because of artifact from the hip replacement. No evidence of pelvic mass. Other: No free fluid or air. Musculoskeletal: Previous vertebral augmentations at T9 and L1. Lower lumbar degenerative changes. Previous hip replacement on the left. Fatty atrophy of the thigh musculature on right. IMPRESSION: No acute finding to explain abdominal pain. Bilateral pleural effusions layering dependently with dependent atelectasis. Chronic lung disease. Cardiomegaly, coronary artery calcification and aortic atherosclerotic calcification. Question small amount of sludge or small stones dependent in the gallbladder. Consider ultrasound correlation. Moderate amount of stool and gas in the colon, but not out of the range of normal. Electronically Signed   By: Nelson Chimes M.D.   On: 01/12/2022 15:53   DG Chest 1 View  Result Date: 01/12/2022 CLINICAL DATA:  Respiratory distress EXAM: CHEST  1 VIEW  COMPARISON:  January 12, 2022 at 11:52 a.m. FINDINGS: Bilateral patchy pulmonary opacities persists but appears somewhat improved in the interval. The cardiomediastinal silhouette is stable. No pneumothorax. No other changes. IMPRESSION: Persistent but mildly improved bilateral patchy pulmonary infiltrates. Electronically Signed   By: Dorise Bullion III M.D.   On: 01/12/2022 13:58   MR Lumbar Spine W Wo Contrast  Result Date: 01/13/2022 CLINICAL DATA:  Fever.  Recent L1 kyphoplasty. EXAM: MRI LUMBAR SPINE WITHOUT AND WITH CONTRAST TECHNIQUE: Multiplanar and multiecho pulse sequences of the lumbar spine were obtained without and with intravenous contrast. CONTRAST:  32mL GADAVIST GADOBUTROL 1 MMOL/ML IV SOLN COMPARISON:  MRI lumbar spine dated December 09, 2021. FINDINGS: Segmentation:  Standard. Alignment: Unchanged mild retrolisthesis at L2-L3. Unchanged trace anterolisthesis at L4-L5 and L5-S1. Vertebrae: Interval cement augmentation at L1 with mild residual marrow edema. No progressive height loss. No new fracture, evidence of discitis, or suspicious bone lesion. Conus medullaris and cauda equina: Conus extends to the L2 level. Conus and cauda equina appear normal. No intrathecal enhancement. Paraspinal and other soft tissues: Small 1.7 cm intrinsically T1 hyperintense fluid collection with low T2 and T1 signal rim adjacent to the left L1 posterior elements, most consistent with a small hematoma. Disc levels: T12-L1:  Negative. L1-L2: Unchanged mild retropulsion of the L1 posteroinferior endplate. No significant disc bulge or herniation. No stenosis. L2-L3: Unchanged asymmetric left-sided disc bulging and endplate spurring. Unchanged moderate left neuroforaminal stenosis. No spinal canal or right neuroforaminal stenosis. L3-L4: Unchanged small broad-based posterior disc protrusion and mild bilateral facet arthropathy. No stenosis. L4-L5: Unchanged small broad-based posterior disc protrusion and moderate  bilateral facet arthropathy. No stenosis. L5-S1: Unchanged small broad-based posterior disc protrusion eccentric to the right. Severe right and mild left facet arthropathy with bulky ligamentum flavum hypertrophy. New prominent right perifacet marrow edema. Unchanged mild right lateral recess stenosis and moderate right neuroforaminal stenosis. No spinal canal or left neuroforaminal stenosis. IMPRESSION: 1. Interval cement augmentation at L1 without progressive height loss. No new fracture or other acute abnormality. 2. Multilevel lumbar spondylosis as described above, overall similar to  prior study. Unchanged moderate left neuroforaminal stenosis at L2-L3 and moderate right neuroforaminal stenosis at L5-S1. 3. Progressive severe right facet arthropathy at L5-S1 with new degenerative inflammatory change, which can be a source of pain. Electronically Signed   By: Titus Dubin M.D.   On: 01/13/2022 15:57   DG Chest Port 1 View  Result Date: 01/12/2022 CLINICAL DATA:  76 year old female with possible sepsis. EXAM: PORTABLE CHEST 1 VIEW COMPARISON:  CTA chest 05/28/2019 and earlier. FINDINGS: Portable AP upright view at 1153 hours. Chronically reduced lung volumes with evidence of interstitial lung disease on 2020 CT. Multifocal bilateral patchy and confluent new pulmonary opacity since radiographs in June 2020. No superimposed pneumothorax or pleural effusion. No definite consolidation. Previously augmented thoracic and lumbar compression fractures. Mediastinal contours remain normal. Visualized tracheal air column is within normal limits. No acute osseous abnormality identified. Negative visible bowel gas. IMPRESSION: Multifocal bilateral patchy lung opacity opacity superimposed on suspected chronic interstitial lung disease with diminished lung volumes. Favor acute bilateral pneumonia in the setting of suspected sepsis. No pleural effusion. Electronically Signed   By: Genevie Ann M.D.   On: 01/12/2022 12:07         Scheduled Meds:  atorvastatin  40 mg Oral QHS   budesonide (PULMICORT) nebulizer solution  0.5 mg Nebulization BID   calcitonin (salmon)  1 spray Alternating Nares Daily   Chlorhexidine Gluconate Cloth  6 each Topical Daily   DULoxetine  60 mg Oral Daily   ezetimibe  10 mg Oral QHS   furosemide  20 mg Intravenous BID   gabapentin  300 mg Oral TID   hydroxychloroquine  200 mg Oral BID   iron polysaccharides  150 mg Oral Daily   isosorbide mononitrate  15 mg Oral Daily   latanoprost  1 drop Both Eyes QHS   mouth rinse  15 mL Mouth Rinse BID   metoprolol tartrate  12.5 mg Oral BID   multivitamin with minerals  1 tablet Oral Daily   spironolactone  25 mg Oral Daily   Continuous Infusions:  ceFEPime (MAXIPIME) IV 2 g (01/14/22 0904)     LOS: 2 days    Time spent: 25 min    Desma Maxim, MD [facility]   If 7PM-7AM, please contact night-coverage [email or web address] Password [facility] 01/14/2022, 10:57 AM

## 2022-01-14 NOTE — NC FL2 (Signed)
[place] LEVEL OF CARE SCREENING TOOL     IDENTIFICATION  Patient Name: Erin [name] Birthdate: 12-21-1945 Sex: female Admission Date (Current Location): 01/12/2022  [facility] and [place] Number:  Engineering geologist and Address:  [facility], [address] [place], [place]  [address]      Provider Number: [id number]  Attending Physician Name and Address:  Gwynne Edinger, MD  Relative Name and Phone Number:  [name] [phone]    Current Level of Care: Hospital Recommended Level of Care: [place] Prior Approval Number:    Date Approved/Denied:   PASRR Number: [id number]  Discharge Plan: SNF    Current Diagnoses: Patient Active Problem List   Diagnosis Date Noted   Sepsis ([place]) 01/12/2022   Acute respiratory failure ([place])    Acute metabolic encephalopathy    Closed compression fracture of body of L1 vertebra ([place]) 12/09/2021   Chronic diastolic CHF (congestive heart failure) ([place]) 12/09/2021   Depression with anxiety 12/09/2021   Normocytic anemia    Fall at home, initial encounter    Angina pectoris ([place]) 11/21/2020   Unstable angina ([place]) 10/30/2020   Bilateral carotid artery stenosis 10/30/2020   Colon polyp 08/31/2019   [name] S/P percutaneous coronary angioplasty 08/31/2019   Degenerative disc disease, lumbar 08/31/2019   Dysphagia 08/31/2019   Environmental allergies 08/31/2019   Esophageal dysmotility 08/31/2019   Fibrocystic breast disease 08/31/2019   Mitral regurgitation 08/31/2019   Acute respiratory disease due to COVID-19 virus 05/28/2019   Food in esophagus causing other injury, initial encounter 04/21/2019   Gastro-esophageal reflux disease with esophagitis 04/21/2019   Gastric nodule 04/21/2019   Varicose veins of leg with swelling, bilateral 69/79/4801   Acute diastolic CHF (congestive heart failure) ([place]) 03/18/2018   Lower extremity edema 03/18/2018   Age-related osteoporosis with  current pathological fracture 03/04/2018   Anemia 02/28/2018   S/P hip hemiarthroplasty 02/26/2018   Hip fracture ([place]) 02/22/2018   Lumbar radiculopathy 01/20/2018   Polyneuropathy 01/20/2018   Chronic insomnia 01/15/2018   Hyperlipidemia 10/26/2017   Coronary artery disease of native artery of native heart with stable angina pectoris ([place]) 10/24/2017   Vulvar irritation 09/02/2017   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) 12/16/2016   SOB (shortness of breath) 12/15/2016   Idiopathic interstitial fibrosis of lung syndrome ([place]) 12/15/2016   Raynaud's disease 12/15/2016   Melanoma of skin ([place]) 11/11/2016   Lesion of labia 11/11/2016   HTN, goal below 140/90 08/29/2014   Encounter for long-term current use of medication 03/30/2014   Scleroderma ([place]) 03/15/2014   Rheumatoid arthritis ([place]) 03/15/2014   Osteopenia 03/15/2014    Orientation RESPIRATION BLADDER Height & Weight     Self, Time, Situation, Place  O2 (2L nasal cannula) Incontinent, External catheter Weight: 140 lb (63.5 kg) Height:  5' (152.4 cm)  BEHAVIORAL SYMPTOMS/MOOD NEUROLOGICAL BOWEL NUTRITION STATUS      Incontinent Diet (see discharge summary)  AMBULATORY STATUS COMMUNICATION OF NEEDS Skin   Limited Assist Verbally Normal                       Personal Care Assistance Level of Assistance  Bathing, Feeding, Dressing, Total care Bathing Assistance: Limited assistance Feeding assistance: Independent Dressing Assistance: Limited assistance Total Care Assistance: Limited assistance   Functional Limitations Info  Sight, Hearing, Speech Sight Info: Adequate Hearing Info: Adequate Speech Info: Adequate    SPECIAL CARE  FACTORS FREQUENCY  PT (By licensed PT), OT (By licensed OT)     PT Frequency: min 4x weekly OT Frequency: min 4x weekly            Contractures Contractures Info: Not present    Additional Factors Info   Code Status, Allergies Code Status Info: DNR Allergies Info: Remicade (Infliximab)   Sulfa Antibiotics   Actonel (Risedronate Sodium)   Fosamax (Alendronate Sodium)   Procardia (Nifedipine)   Elemental Sulfur   Wellbutrin (Bupropion)           Current Medications (01/14/2022):  This is the current hospital active medication list Current Facility-Administered Medications  Medication Dose Route Frequency Provider Last Rate Last Admin   acetaminophen (TYLENOL) tablet 650 mg  650 mg Oral Q6H PRN Agbata, Tochukwu, MD       ALPRAZolam Duanne Moron) tablet 0.5 mg  0.5 mg Oral Q12H PRN Wouk, Ailene Rud, MD       atorvastatin (LIPITOR) tablet 40 mg  40 mg Oral QHS Agbata, Tochukwu, MD   40 mg at 01/13/22 2130   budesonide (PULMICORT) nebulizer solution 0.5 mg  0.5 mg Nebulization BID Agbata, Tochukwu, MD   0.5 mg at 01/14/22 0719   calcitonin (salmon) (MIACALCIN/FORTICAL) nasal spray 1 spray  1 spray Alternating Nares Daily Agbata, Tochukwu, MD   1 spray at 01/14/22 0931   ceFEPIme (MAXIPIME) 2 g in sodium chloride 0.9 % 100 mL IVPB  2 g Intravenous Q12H Oswald Hillock, RPH 200 mL/hr at 01/14/22 3295 2 g at 01/14/22 1884   Chlorhexidine Gluconate Cloth 2 % PADS 6 each  6 each Topical Daily Wouk, Ailene Rud, MD       DULoxetine (CYMBALTA) DR capsule 60 mg  60 mg Oral Daily Agbata, Tochukwu, MD   60 mg at 01/14/22 0840   enoxaparin (LOVENOX) injection 40 mg  40 mg Subcutaneous Q24H Wouk, Ailene Rud, MD       ezetimibe (ZETIA) tablet 10 mg  10 mg Oral QHS Agbata, Tochukwu, MD   10 mg at 01/13/22 2131   furosemide (LASIX) injection 20 mg  20 mg Intravenous BID Ottie Glazier, MD   20 mg at 01/14/22 0848   gabapentin (NEURONTIN) capsule 300 mg  300 mg Oral TID Gwynne Edinger, MD   300 mg at 01/14/22 0844   hydroxychloroquine (PLAQUENIL) tablet 200 mg  200 mg Oral BID Agbata, Tochukwu, MD   200 mg at 01/14/22 0846   ipratropium-albuterol (DUONEB) 0.5-2.5 (3) MG/3ML nebulizer solution 3 mL  3 mL  Nebulization Q6H PRN Agbata, Tochukwu, MD       iron polysaccharides (NIFEREX) capsule 150 mg  150 mg Oral Daily Agbata, Tochukwu, MD   150 mg at 01/14/22 0846   isosorbide mononitrate (IMDUR) 24 hr tablet 15 mg  15 mg Oral Daily Wouk, Ailene Rud, MD   15 mg at 01/14/22 0844   latanoprost (XALATAN) 0.005 % ophthalmic solution 1 drop  1 drop Both Eyes QHS Agbata, Tochukwu, MD   1 drop at 01/13/22 2133   lip balm (BLISTEX) ointment   Topical PRN Wouk, Ailene Rud, MD       MEDLINE mouth rinse  15 mL Mouth Rinse BID Wouk, Ailene Rud, MD       melatonin tablet 2.5 mg  2.5 mg Oral QHS PRN Foust, Katy L, NP   2.5 mg at 01/14/22 0009   metoprolol tartrate (LOPRESSOR) tablet 12.5 mg  12.5 mg Oral BID Wouk, Ailene Rud, MD   12.5  mg at 01/14/22 0842   multivitamin with minerals tablet 1 tablet  1 tablet Oral Daily Agbata, Tochukwu, MD   1 tablet at 01/14/22 0840   nitroGLYCERIN (NITROSTAT) SL tablet 0.4 mg  0.4 mg Sublingual Q5 min PRN Agbata, Tochukwu, MD       ondansetron (ZOFRAN) tablet 4 mg  4 mg Oral Q6H PRN Agbata, Tochukwu, MD       Or   ondansetron (ZOFRAN) injection 4 mg  4 mg Intravenous Q6H PRN Agbata, Tochukwu, MD   4 mg at 01/14/22 0850   pantoprazole (PROTONIX) injection 40 mg  40 mg Intravenous Q12H Wouk, Ailene Rud, MD       polyethylene glycol (MIRALAX / GLYCOLAX) packet 17 g  17 g Oral Daily PRN Agbata, Tochukwu, MD       potassium chloride SA (KLOR-CON M) CR tablet 40 mEq  40 mEq Oral Once Wouk, Ailene Rud, MD       spironolactone (ALDACTONE) tablet 25 mg  25 mg Oral Daily Ottie Glazier, MD   25 mg at 01/14/22 0845     Discharge Medications: Please see discharge summary for a list of discharge medications.  Relevant Imaging Results:  Relevant Lab Results:   Additional Information [id number]  Alberteen Sam, LCSW

## 2022-01-14 NOTE — Progress Notes (Signed)
PULMONOLOGY         Date: 01/14/2022,   MRN# [id number] Erin [name] [birth year]     AdmissionWeight: 63.5 kg                 CurrentWeight: 63.5 kg   Referring physician: Dr Si Raider   CHIEF COMPLAINT:   Acute exacerbation of ILD with screroderma   HISTORY OF PRESENT ILLNESS   This is a pleasant patient with hx of RA GERD cad, HCC, came in for acute on set hypoxemia and febrile illness. She required 6l/min to reach normoxia in ED and was sent to ER from ALF.  She is being treated for pneumonia.  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   She has severe anemia and received trasnfusion today.   01/14/22- patient is resting comfortably in bed , shes improved clinically weaning O2.  Plan to continue current care till tommorow.   PAST MEDICAL HISTORY   Past Medical History:  Diagnosis Date   Anemia    h/o   Cancer ([place])    melanoma x 3the last one was of her leg and surgeon removed it   Colon polyp    Complication of anesthesia    PT STATED THAT WITH JOINT SURGERY THAT SHE WAS GIVEN SOME TYPE OF ANESTHESIA THAT MADE HER HALLUCINATE    Coronary artery calcification    Coronary artery disease    DDD (degenerative disc disease), lumbar    Diverticulosis 01/20/2017   Dysphagia    Dyspnea    with exertion-unable to walk a mile without getting sob- dr sparks set pt up to see Dr Rockey Situ after 11-19-16 surgery   Environmental allergies    Esophageal dysmotility    Fibrocystic breast disease    GERD (gastroesophageal reflux disease)    Heart murmur    asymptomatic   Hyperlipidemia    unspecified   Hypertension    Mitral regurgitation    Monilial esophagitis ([place]) 01/20/2017   Obstipation    Osteopenia    Peptic ulcer disease    Pulmonary fibrosis ([place])    per Dr Raul Del   RA (rheumatoid arthritis) ([place])    Raynaud's disease    Redundant colon 01/20/2017   Scleroderma ([place])    Telangiectasia of colon    Tubular  adenoma of colon 01/20/2017   unspecified   Vulvar dysplasia      SURGICAL HISTORY   Past Surgical History:  Procedure Laterality Date   ABDOMINAL HYSTERECTOMY     BREAST CYST EXCISION Left 20+ years ago   No scar visible   BREAST LUMPECTOMY Right    CARDIAC CATHETERIZATION     CARPAL TUNNEL RELEASE Bilateral    COLON SURGERY     colon polyp    COLONOSCOPY  09/30/2011   tubular adenoma rtm   COLONOSCOPY     05/02/2003, 04/10/2000   COLONOSCOPY WITH PROPOFOL N/A 01/20/2017   Procedure: COLONOSCOPY WITH PROPOFOL;  Surgeon: Lollie Sails, MD;  Location: [facility] ENDOSCOPY;  Service: Endoscopy;  Laterality: N/A;   COLONOSCOPY WITH PROPOFOL N/A 09/17/2020   Procedure: COLONOSCOPY WITH PROPOFOL;  Surgeon: Lesly Rubenstein, MD;  Location: [facility] ENDOSCOPY;  Service: Endoscopy;  Laterality: N/A;   CORONARY ATHERECTOMY N/A 11/21/2020   Procedure: CORONARY ATHERECTOMY;  Surgeon: Wellington Hampshire, MD;  Location: [place] CV LAB;  Service: Cardiovascular;  Laterality: N/A;   CORONARY STENT INTERVENTION  11/21/2020   CORONARY STENT  INTERVENTION N/A 11/21/2020   Procedure: CORONARY STENT INTERVENTION;  Surgeon: Wellington Hampshire, MD;  Location: [place] CV LAB;  Service: Cardiovascular;  Laterality: N/A;   ESOPHAGOGASTRODUODENOSCOPY     ESOPHAGOGASTRODUODENOSCOPY     09/30/2011, 04/10/2000 , no repeat rtm   ESOPHAGOGASTRODUODENOSCOPY N/A 03/02/2018   Procedure: ESOPHAGOGASTRODUODENOSCOPY (EGD);  Surgeon: Virgel Manifold, MD;  Location: [facility] ENDOSCOPY;  Service: Endoscopy;  Laterality: N/A;   ESOPHAGOGASTRODUODENOSCOPY (EGD) WITH PROPOFOL N/A 01/20/2017   Procedure: ESOPHAGOGASTRODUODENOSCOPY (EGD) WITH PROPOFOL;  Surgeon: Lollie Sails, MD;  Location: [facility] ENDOSCOPY;  Service: Endoscopy;  Laterality: N/A;   ESOPHAGOGASTRODUODENOSCOPY (EGD) WITH PROPOFOL N/A 09/17/2020   Procedure: ESOPHAGOGASTRODUODENOSCOPY (EGD) WITH PROPOFOL;  Surgeon: Lesly Rubenstein, MD;  Location: [facility]  ENDOSCOPY;  Service: Endoscopy;  Laterality: N/A;   EXCISION HYDRADENITIS LABIA Left 11/19/2016   Procedure: EXCISION LABIAL MASS;  Surgeon: Robert Bellow, MD;  Location: [facility] ORS;  Service: General;  Laterality: Left;   EYE SURGERY Bilateral    cataracts   HEEL SPUR EXCISION     HIP ARTHROPLASTY Left 02/23/2018   Procedure: ARTHROPLASTY BIPOLAR HIP (HEMIARTHROPLASTY);  Surgeon: Thornton Park, MD;  Location: [facility] ORS;  Service: Orthopedics;  Laterality: Left;   IR KYPHO LUMBAR INC FX REDUCE BONE BX UNI/BIL CANNULATION INC/IMAGING  12/16/2021   KYPHOPLASTY N/A 04/26/2019   Procedure: KYPHOPLASTY T7;  Surgeon: Hessie Knows, MD;  Location: [facility] ORS;  Service: Orthopedics;  Laterality: N/A;   KYPHOPLASTY N/A 05/17/2019   Procedure: KYPHOPLASTY T8;  Surgeon: Hessie Knows, MD;  Location: [facility] ORS;  Service: Orthopedics;  Laterality: N/A;   KYPHOPLASTY N/A 07/26/2019   Procedure: T-9 KYPHOPLASTY;  Surgeon: Hessie Knows, MD;  Location: [facility] ORS;  Service: Orthopedics;  Laterality: N/A;   MASS EXCISION Left 11/19/2016   Procedure: EXCISION LEFT THIGH MELANOMA;  Surgeon: Robert Bellow, MD;  Location: [facility] ORS;  Service: General;  Laterality: Left;   RIGHT/LEFT HEART CATH AND CORONARY ANGIOGRAPHY N/A 11/15/2020   Procedure: RIGHT/LEFT HEART CATH AND CORONARY ANGIOGRAPHY poss intervention;  Surgeon: Minna Merritts, MD;  Location: [place] CV LAB;  Service: Cardiovascular;  Laterality: N/A;   SENTINEL NODE BIOPSY Left 11/19/2016   Procedure: INGUINAL SENTINEL NODE BIOPSY;  Surgeon: Robert Bellow, MD;  Location: [facility] ORS;  Service: General;  Laterality: Left;   UPPER ESOPHAGEAL ENDOSCOPIC ULTRASOUND (EUS) N/A 01/29/2017   Procedure: UPPER ESOPHAGEAL ENDOSCOPIC ULTRASOUND (EUS);  Surgeon: Holly Bodily, MD;  Location: [facility] ENDOSCOPY;  Service: Endoscopy;  Laterality: N/A;     FAMILY HISTORY   Family History  Problem Relation Age of Onset   Endometrial cancer Mother 24    Hypertension Mother    Osteoporosis Mother    Bladder Cancer Father    COPD Father    Cerebral palsy Sister      SOCIAL HISTORY   Social History   Tobacco Use   Smoking status: Never   Smokeless tobacco: Never  Vaping Use   Vaping Use: Never used  Substance Use Topics   Alcohol use: No   Drug use: No     MEDICATIONS    Home Medication:    Current Medication:  Current Facility-Administered Medications:    acetaminophen (TYLENOL) tablet 650 mg, 650 mg, Oral, Q6H PRN, Agbata, Tochukwu, MD   ALPRAZolam (XANAX) tablet 0.5 mg, 0.5 mg, Oral, Q12H PRN, Wouk, Ailene Rud, MD   atorvastatin (LIPITOR) tablet 40 mg, 40 mg, Oral, QHS, Agbata, Tochukwu, MD, 40 mg at 01/13/22 2130   budesonide (PULMICORT) nebulizer  solution 0.5 mg, 0.5 mg, Nebulization, BID, Agbata, Tochukwu, MD, 0.5 mg at 01/14/22 0719   calcitonin (salmon) (MIACALCIN/FORTICAL) nasal spray 1 spray, 1 spray, Alternating Nares, Daily, Agbata, Tochukwu, MD, 1 spray at 01/14/22 0931   ceFEPIme (MAXIPIME) 2 g in sodium chloride 0.9 % 100 mL IVPB, 2 g, Intravenous, Q12H, Oswald Hillock, RPH, Last Rate: 200 mL/hr at 01/14/22 0904, 2 g at 01/14/22 1779   Chlorhexidine Gluconate Cloth 2 % PADS 6 each, 6 each, Topical, Daily, Wouk, Ailene Rud, MD   DULoxetine (CYMBALTA) DR capsule 60 mg, 60 mg, Oral, Daily, Agbata, Tochukwu, MD, 60 mg at 01/14/22 0840   enoxaparin (LOVENOX) injection 40 mg, 40 mg, Subcutaneous, Q24H, Wouk, Ailene Rud, MD, 40 mg at 01/14/22 1228   ezetimibe (ZETIA) tablet 10 mg, 10 mg, Oral, QHS, Agbata, Tochukwu, MD, 10 mg at 01/13/22 2131   feeding supplement (ENSURE ENLIVE / ENSURE PLUS) liquid 237 mL, 237 mL, Oral, BID BM, Wouk, Ailene Rud, MD   furosemide (LASIX) injection 20 mg, 20 mg, Intravenous, BID, Lanney Gins, Dalani Mette, MD, 20 mg at 01/14/22 0848   gabapentin (NEURONTIN) capsule 300 mg, 300 mg, Oral, TID, Wouk, Ailene Rud, MD, 300 mg at 01/14/22 0844   hydroxychloroquine (PLAQUENIL) tablet 200 mg,  200 mg, Oral, BID, Agbata, Tochukwu, MD, 200 mg at 01/14/22 0846   ipratropium-albuterol (DUONEB) 0.5-2.5 (3) MG/3ML nebulizer solution 3 mL, 3 mL, Nebulization, Q6H PRN, Agbata, Tochukwu, MD   iron polysaccharides (NIFEREX) capsule 150 mg, 150 mg, Oral, Daily, Agbata, Tochukwu, MD, 150 mg at 01/14/22 0846   isosorbide mononitrate (IMDUR) 24 hr tablet 15 mg, 15 mg, Oral, Daily, Wouk, Ailene Rud, MD, 15 mg at 01/14/22 0844   latanoprost (XALATAN) 0.005 % ophthalmic solution 1 drop, 1 drop, Both Eyes, QHS, Agbata, Tochukwu, MD, 1 drop at 01/13/22 2133   lip balm (BLISTEX) ointment, , Topical, PRN, Wouk, Ailene Rud, MD   MEDLINE mouth rinse, 15 mL, Mouth Rinse, BID, Wouk, Ailene Rud, MD, 15 mL at 01/14/22 1244   melatonin tablet 2.5 mg, 2.5 mg, Oral, QHS PRN, Foust, Katy L, NP, 2.5 mg at 01/14/22 0009   metoprolol tartrate (LOPRESSOR) tablet 12.5 mg, 12.5 mg, Oral, BID, Wouk, Ailene Rud, MD, 12.5 mg at 01/14/22 3903   multivitamin with minerals tablet 1 tablet, 1 tablet, Oral, Daily, Agbata, Tochukwu, MD, 1 tablet at 01/14/22 0840   nitroGLYCERIN (NITROSTAT) SL tablet 0.4 mg, 0.4 mg, Sublingual, Q5 min PRN, Agbata, Tochukwu, MD   ondansetron (ZOFRAN) tablet 4 mg, 4 mg, Oral, Q6H PRN **OR** ondansetron (ZOFRAN) injection 4 mg, 4 mg, Intravenous, Q6H PRN, Agbata, Tochukwu, MD, 4 mg at 01/14/22 0850   pantoprazole (PROTONIX) injection 40 mg, 40 mg, Intravenous, Q12H, Wouk, Ailene Rud, MD, 40 mg at 01/14/22 1229   polyethylene glycol (MIRALAX / GLYCOLAX) packet 17 g, 17 g, Oral, Daily PRN, Agbata, Tochukwu, MD   spironolactone (ALDACTONE) tablet 25 mg, 25 mg, Oral, Daily, Lanney Gins, Ikechukwu Cerny, MD, 25 mg at 01/14/22 0845    ALLERGIES   Remicade [infliximab], Sulfa antibiotics, Actonel [risedronate sodium], Fosamax [alendronate sodium], Procardia [nifedipine], Elemental sulfur, and Wellbutrin [bupropion]     REVIEW OF SYSTEMS    Review of Systems:  Gen:  Denies  fever, sweats, chills weigh  loss  HEENT: Denies blurred vision, double vision, ear pain, eye pain, hearing loss, nose bleeds, sore throat Cardiac:  No dizziness, chest pain or heaviness, chest tightness,edema Resp:   Denies cough or sputum porduction, shortness of breath,wheezing, hemoptysis,  Gi: Denies  swallowing difficulty, stomach pain, nausea or vomiting, diarrhea, constipation, bowel incontinence Gu:  Denies bladder incontinence, burning urine Ext:   Denies Joint pain, stiffness or swelling Skin: Denies  skin rash, easy bruising or bleeding or hives Endoc:  Denies polyuria, polydipsia , polyphagia or weight change Psych:   Denies depression, insomnia or hallucinations   Other:  All other systems negative   VS: BP 108/61 (BP Location: Left Arm)    Pulse 71    Temp (!) (P) 104 F (40 C)    Resp 20    Ht 5' (1.524 m)    Wt 63.5 kg    LMP  (LMP Unknown)    SpO2 100%    BMI 27.34 kg/m      PHYSICAL EXAM    GENERAL:NAD, no fevers, chills, no weakness no fatigue HEAD: Normocephalic, atraumatic.  EYES: Pupils equal, round, reactive to light. Extraocular muscles intact. No scleral icterus.  MOUTH: Moist mucosal membrane. Dentition intact. No abscess noted.  EAR, NOSE, THROAT: Clear without exudates. No external lesions.  NECK: Supple. No thyromegaly. No nodules. No JVD.  PULMONARY: mild ronchi bilaterally  CARDIOVASCULAR: S1 and S2. Regular rate and rhythm. No murmurs, rubs, or gallops. No edema. Pedal pulses 2+ bilaterally.  GASTROINTESTINAL: Soft, nontender, nondistended. No masses. Positive bowel sounds. No hepatosplenomegaly.  MUSCULOSKELETAL: No swelling, clubbing, or edema. Range of motion full in all extremities.  NEUROLOGIC: Cranial nerves II through XII are intact. No gross focal neurological deficits. Sensation intact. Reflexes intact.  SKIN: No ulceration, lesions, rashes, or cyanosis. Skin warm and dry. Turgor intact.  PSYCHIATRIC: Mood, affect within normal limits. The patient is awake, alert and  oriented x 3. Insight, judgment intact.       IMAGING    CT ABDOMEN PELVIS WO CONTRAST  Result Date: 01/12/2022 CLINICAL DATA:  Abdominal pain, acute. EXAM: CT ABDOMEN AND PELVIS WITHOUT CONTRAST TECHNIQUE: Multidetector CT imaging of the abdomen and pelvis was performed following the standard protocol without IV contrast. RADIATION DOSE REDUCTION: This exam was performed according to the departmental dose-optimization program which includes automated exposure control, adjustment of the mA and/or kV according to patient size and/or use of iterative reconstruction technique. COMPARISON:  CT pelvis 12/09/2021.  Chest radiography same day. FINDINGS: Lower chest: Bilateral pleural effusions layering dependently with dependent atelectasis. Some areas of pulmonary scarring and some pleural emphysematous change related to chronic lung disease. The heart is enlarged. There are coronary artery calcifications. Hepatobiliary: Liver parenchyma is normal. Question sludge or small stones dependent in the gallbladder. Pancreas: Normal Spleen: Normal Adrenals/Urinary Tract: Adrenal glands are normal. Kidneys are normal. No cyst, mass, stone or hydronephrosis. Foley catheter in the bladder. Stomach/Bowel: Stomach and small intestine are normal. Moderate amount of stool and gas within the colon but no acute finding. Vascular/Lymphatic: Aortic atherosclerosis. No aneurysm. IVC is normal. No adenopathy. Reproductive: Limited detail because of artifact from the hip replacement. No evidence of pelvic mass. Other: No free fluid or air. Musculoskeletal: Previous vertebral augmentations at T9 and L1. Lower lumbar degenerative changes. Previous hip replacement on the left. Fatty atrophy of the thigh musculature on right. IMPRESSION: No acute finding to explain abdominal pain. Bilateral pleural effusions layering dependently with dependent atelectasis. Chronic lung disease. Cardiomegaly, coronary artery calcification and aortic  atherosclerotic calcification. Question small amount of sludge or small stones dependent in the gallbladder. Consider ultrasound correlation. Moderate amount of stool and gas in the colon, but not out of the range of normal. Electronically Signed   By:  Nelson Chimes M.D.   On: 01/12/2022 15:53   DG Chest 1 View  Result Date: 01/12/2022 CLINICAL DATA:  Respiratory distress EXAM: CHEST  1 VIEW COMPARISON:  January 12, 2022 at 11:52 a.m. FINDINGS: Bilateral patchy pulmonary opacities persists but appears somewhat improved in the interval. The cardiomediastinal silhouette is stable. No pneumothorax. No other changes. IMPRESSION: Persistent but mildly improved bilateral patchy pulmonary infiltrates. Electronically Signed   By: Dorise Bullion III M.D.   On: 01/12/2022 13:58   MR Lumbar Spine W Wo Contrast  Result Date: 01/13/2022 CLINICAL DATA:  Fever.  Recent L1 kyphoplasty. EXAM: MRI LUMBAR SPINE WITHOUT AND WITH CONTRAST TECHNIQUE: Multiplanar and multiecho pulse sequences of the lumbar spine were obtained without and with intravenous contrast. CONTRAST:  58mL GADAVIST GADOBUTROL 1 MMOL/ML IV SOLN COMPARISON:  MRI lumbar spine dated December 09, 2021. FINDINGS: Segmentation:  Standard. Alignment: Unchanged mild retrolisthesis at L2-L3. Unchanged trace anterolisthesis at L4-L5 and L5-S1. Vertebrae: Interval cement augmentation at L1 with mild residual marrow edema. No progressive height loss. No new fracture, evidence of discitis, or suspicious bone lesion. Conus medullaris and cauda equina: Conus extends to the L2 level. Conus and cauda equina appear normal. No intrathecal enhancement. Paraspinal and other soft tissues: Small 1.7 cm intrinsically T1 hyperintense fluid collection with low T2 and T1 signal rim adjacent to the left L1 posterior elements, most consistent with a small hematoma. Disc levels: T12-L1:  Negative. L1-L2: Unchanged mild retropulsion of the L1 posteroinferior endplate. No significant disc bulge  or herniation. No stenosis. L2-L3: Unchanged asymmetric left-sided disc bulging and endplate spurring. Unchanged moderate left neuroforaminal stenosis. No spinal canal or right neuroforaminal stenosis. L3-L4: Unchanged small broad-based posterior disc protrusion and mild bilateral facet arthropathy. No stenosis. L4-L5: Unchanged small broad-based posterior disc protrusion and moderate bilateral facet arthropathy. No stenosis. L5-S1: Unchanged small broad-based posterior disc protrusion eccentric to the right. Severe right and mild left facet arthropathy with bulky ligamentum flavum hypertrophy. New prominent right perifacet marrow edema. Unchanged mild right lateral recess stenosis and moderate right neuroforaminal stenosis. No spinal canal or left neuroforaminal stenosis. IMPRESSION: 1. Interval cement augmentation at L1 without progressive height loss. No new fracture or other acute abnormality. 2. Multilevel lumbar spondylosis as described above, overall similar to prior study. Unchanged moderate left neuroforaminal stenosis at L2-L3 and moderate right neuroforaminal stenosis at L5-S1. 3. Progressive severe right facet arthropathy at L5-S1 with new degenerative inflammatory change, which can be a source of pain. Electronically Signed   By: Titus Dubin M.D.   On: 01/13/2022 15:57   DG Chest Port 1 View  Result Date: 01/12/2022 CLINICAL DATA:  76 year old female with possible sepsis. EXAM: PORTABLE CHEST 1 VIEW COMPARISON:  CTA chest 05/28/2019 and earlier. FINDINGS: Portable AP upright view at 1153 hours. Chronically reduced lung volumes with evidence of interstitial lung disease on 2020 CT. Multifocal bilateral patchy and confluent new pulmonary opacity since radiographs in June 2020. No superimposed pneumothorax or pleural effusion. No definite consolidation. Previously augmented thoracic and lumbar compression fractures. Mediastinal contours remain normal. Visualized tracheal air column is within normal  limits. No acute osseous abnormality identified. Negative visible bowel gas. IMPRESSION: Multifocal bilateral patchy lung opacity opacity superimposed on suspected chronic interstitial lung disease with diminished lung volumes. Favor acute bilateral pneumonia in the setting of suspected sepsis. No pleural effusion. Electronically Signed   By: Genevie Ann M.D.   On: 01/12/2022 12:07   IR KYPHO LUMBAR INC FX REDUCE BONE BX UNI/BIL CANNULATION INC/IMAGING  Result Date: 12/16/2021 CLINICAL DATA:  History of fall. Symptomatic L1 compression fracture deformity. Failed conservative medical management EXAM: FLUOROSCOPIC GUIDED KYPHOPLASTY OF L1 VERTEBRAL BODY COMPARISON:  MR L-spine and lumbar XRs, 12/09/2021. MEDICATIONS: As antibiotic prophylaxis, Ancef 2 g IV was ordered pre-procedure and administered intravenously within 1 hour of incision. ANESTHESIA/SEDATION: Moderate (conscious) sedation was employed during this procedure. A total of Versed 4 mg and Fentanyl 200 mcg was administered intravenously. Moderate Sedation Time: 72 minutes. The patient's level of consciousness and vital signs were monitored continuously by radiology nursing throughout the procedure under my direct supervision. FLUOROSCOPY TIME:  12 minutes, 36 seconds (628.3 mGy) COMPLICATIONS: None immediate. TECHNIQUE: The procedure, risks (including but not limited to bleeding, infection, organ damage), benefits, and alternatives were explained to the patient. Questions regarding the procedure were encouraged and answered. The patient understands and consents to the procedure. The patient was placed prone on the fluoroscopic table. The skin overlying the upper thoracic region was then prepped and draped in the usual sterile fashion. Maximal barrier sterile technique was utilized including caps, mask, sterile gowns, sterile gloves, sterile drape, hand hygiene and skin antiseptic. Intravenous Fentanyl and Versed were administered as conscious sedation during  continuous cardiorespiratory monitoring by the radiology RN. The LEFT pedicle at L1 vertebral body was then infiltrated with 1% lidocaine followed by the advancement of a Kyphon trocar needle transpedicular at the LEFT pedicle into the posterior one-third of the vertebral body. Subsequently, the osteo drill was advanced to the anterior third of the vertebral body. The osteo drill was retracted. The maneuvers were repeated for trocar needle insertion into the RIGHT pedicle at L1 vertebral body. Through the working cannulas, Kyphon inflatable bone tamps 15 x 3 was advanced and positioned with the distal marker approximately 5 mm from the anterior aspect of the cortex. Appropriate positioning was confirmed on the AP projection. At this time, the balloons were expanded using contrast via a Kyphon inflation syringe device via micro tubing. Inflations was continued until there was near apposition with the superior end plate. At this time, methylmethacrylate (PMMA) mixture was reconstituted in the Kyphon bone mixing device system. This was then loaded into the delivery mechanism, attached to Kyphon bone filler. The balloons were deflated and removed followed by the instillation of methylmethacrylate mixture with excellent filling in the AP and lateral projections. Approximately 8 mL PMMA was administered. No extravasation was noted in the disk spaces or posteriorly into the spinal canal. No epidural venous contamination was seen. The working cannula and the bone filler was then retrieved and removed. Hemostasis was achieved with manual compression. The patient tolerated the procedure well without immediate postprocedural complication. IMPRESSION: Successful L1 vertebral body augmentation using balloon kyphoplasty, using a bi-pedicular approach, as above. Michaelle Birks, MD [facility] Radiology Electronically Signed   By: Michaelle Birks M.D.   On: 12/16/2021 17:51       ASSESSMENT/PLAN   Acute hypoxemic respiratory failure   Likely due to acute exacerbation of ILD/pulmonary fibrosis    - etiology may be LRTI with viral infection    - Resp viral panel ordered    - rheumatoid factor    - CRP trend    -PCT is low 0.15     -agree with plaqenil 200 bid     -starting solumedrol 20 bid   Severe normocytic anemia    - s/p blood transfusion      Patietn denies blood in stool or urine   Thank  you for allowing me to participate in the care of this patient.  Total face to face encounter time for this patient visit was >45 min. >50% of the time was  spent in counseling and coordination of care.   Patient/Family are satisfied with care plan and all questions have been answered.  This document was prepared using Dragon voice recognition software and may include unintentional dictation errors.     Ottie Glazier, M.D.  Division of [place]

## 2022-01-14 NOTE — Evaluation (Signed)
Physical Therapy Evaluation Patient Details Name: Erin [name] MRN: [id number] DOB: Oct 01, 1946 Today's Date: 01/14/2022  History of Present Illness  Erin [name] is a 76 y.o. female with medical history significant for hypertension, coronary artery disease, GERD who was sent from the assisted living facility where she currently resides to the ER for evaluation of fever and hypoxia.   Clinical Impression  Pt admitted with above diagnosis. Pt received in bed agreeable to PT services with RN and RN student present giving morning meds.  Pt A&Ox3 but not to situation. Per EMR this is pt's baseline cognition due to history of dementia. Per pt's POA and pt, RN reports pt recently moved to [place] full time but just moved in over the weekend then brought to hospital for current admission. Anticipate due to need for SNF placement, pt required assist for ADL/IADL as pt unclear about current baseline for these tasks but reports ability to amb with RW short distances with assist. Pt resting on 2L/min [place] with SPO2 at 84% at rest so titrated to 3L/min. With bed features and increased time pt able to transfer to seated EoB and with minA and momentum, standing to RW with poor hand placement despite max multimodal cuing. Increased WOB noted with standing for ~2 min for RN student to perform perihygiene due to stool on chuck pad with pt heavy leaning on RW unsafely with hands off of hand bars despite max VC's. Pt ultimately able to step pivot transfer with RW to recliner with good sequencing of RW. SPO2 down to 77% with max HR up to 107 BPM that returns to resting upper 90's with seated rest. All needs in reach with RN and RN student present monitoring O2 sats upon PT exit. Pt below reported baseline level of function with inability to ambulate at this time due to SOB and SPO2 sats. Due to these deficits and deconditioned state with poor safety awareness pt will benefit from STR at discharge. Pt currently with functional  limitations due to the deficits listed below (see PT Problem List). Pt will benefit from skilled PT to increase their independence and safety with mobility to allow discharge to the venue listed below.      Recommendations for follow up therapy are one component of a multi-disciplinary discharge planning process, led by the attending physician.  Recommendations may be updated based on patient status, additional functional criteria and insurance authorization.  Follow Up Recommendations Skilled nursing-short term rehab (<3 hours/day)    Assistance Recommended at Discharge Intermittent Supervision/Assistance  Patient can return home with the following  A little help with walking and/or transfers;A little help with bathing/dressing/bathroom;Assistance with cooking/housework;Direct supervision/assist for financial management;Assist for transportation;Assistance with feeding;Direct supervision/assist for medications management;Help with stairs or ramp for entrance    Equipment Recommendations Other (comment) (tbd by next venue)  Recommendations for Other Services       Functional Status Assessment Patient has had a recent decline in their functional status and demonstrates the ability to make significant improvements in function in a reasonable and predictable amount of time.     Precautions / Restrictions Precautions Precautions: Fall Restrictions Weight Bearing Restrictions: No      Mobility  Bed Mobility Overal bed mobility: Needs Assistance Bed Mobility: Supine to Sit     Supine to sit: Supervision, HOB elevated     General bed mobility comments: increased time and bed features Patient Response: Cooperative, Flat affect  Transfers Overall transfer level: Needs assistance Equipment used: Rolling walker (2  wheels) Transfers: Bed to chair/wheelchair/BSC     Step pivot transfers: Min assist       General transfer comment: VC's for hand placement with poor carryover. Stood  with minA and momentum. Good sequencing of RW with step pivot to recliner.    Ambulation/Gait               General Gait Details: Deferred due to O2 sats and SOB.  Stairs            Wheelchair Mobility    Modified Rankin (Stroke Patients Only)       Balance Overall balance assessment: Needs assistance Sitting-balance support: No upper extremity supported, Feet supported Sitting balance-Leahy Scale: Fair     Standing balance support: Bilateral upper extremity supported, Reliant on assistive device for balance, During functional activity Standing balance-Leahy Scale: Fair Standing balance comment: Requires Heavy UE reliance on RW for support. Kyphotic and forward trunk lean.                             Pertinent Vitals/Pain Pain Assessment Pain Assessment: Faces Faces Pain Scale: Hurts little more Pain Location: Globally Pain Descriptors / Indicators: Sore Pain Intervention(s): Repositioned, Monitored during session    Home Living Family/patient expects to be discharged to:: Skilled nursing facility                   Additional Comments: Moved into [place] last weekend just before coming into hospital    Prior Function Prior Level of Function : Needs assist       Physical Assist : Mobility (physical);ADLs (physical)     Mobility Comments: Pt reports ambulating short distances with RW with assist since previous admission. ADLs Comments: Does not specify if need for assist for bathing, dressing, toileting, etc despite being asked.     Hand Dominance   Dominant Hand: Right    Extremity/Trunk Assessment   Upper Extremity Assessment Upper Extremity Assessment: Defer to OT evaluation    Lower Extremity Assessment Lower Extremity Assessment: Generalized weakness       Communication   Communication: No difficulties  Cognition Arousal/Alertness: Awake/alert Behavior During Therapy: WFL for tasks assessed/performed Overall  Cognitive Status: History of cognitive impairments - at baseline                                 General Comments: A&Ox3, not oriented to situation. Dementia at baseline. follows simple commands with 100% accuracy and appropriate conversation throughout.        General Comments General comments (skin integrity, edema, etc.): SPO2 down to 77% on 3L/min post mobility. HR up to 107 BPM. RN aware and titrating appropriately monitoring SPO2 upon PT exit of room.    Exercises Other Exercises Other Exercises: Role of PT in acute setting, d/c recs.   Assessment/Plan    PT Assessment Patient needs continued PT services  PT Problem List Decreased strength;Cardiopulmonary status limiting activity;Decreased activity tolerance;Decreased balance;Decreased safety awareness;Decreased mobility       PT Treatment Interventions DME instruction;Therapeutic exercise;Gait training;Balance training;Stair training;Neuromuscular re-education;Functional mobility training;Therapeutic activities;Patient/family education    PT Goals (Current goals can be found in the Care Plan section)  Acute Rehab PT Goals PT Goal Formulation: Patient unable to participate in goal setting    Frequency Min 2X/week     Co-evaluation  AM-PAC PT "6 Clicks" Mobility  Outcome Measure Help needed turning from your back to your side while in a flat bed without using bedrails?: A Lot Help needed moving from lying on your back to sitting on the side of a flat bed without using bedrails?: A Lot Help needed moving to and from a bed to a chair (including a wheelchair)?: A Little Help needed standing up from a chair using your arms (e.g., wheelchair or bedside chair)?: A Little Help needed to walk in hospital room?: A Lot Help needed climbing 3-5 steps with a railing? : Total 6 Click Score: 13    End of Session Equipment Utilized During Treatment: Gait belt;Oxygen Activity Tolerance: Patient  limited by fatigue;Treatment limited secondary to medical complications (Comment) (SPO2) Patient left: in chair;with nursing/sitter in room;with call bell/phone within reach Nurse Communication: Mobility status PT Visit Diagnosis: Other abnormalities of gait and mobility (R26.89);Muscle weakness (generalized) (M62.81);Difficulty in walking, not elsewhere classified (R26.2)    Time: 0912-0928 PT Time Calculation (min) (ACUTE ONLY): 16 min   Charges:   PT Evaluation $PT Eval Low Complexity: 1 Low PT Treatments $Therapeutic Activity: 8-22 mins      Zaiah Eckerson M. Fairly IV, PT, DPT Physical Therapist- [facility]  01/14/2022, 10:16 AM

## 2022-01-15 ENCOUNTER — Encounter: Payer: Self-pay | Admitting: Internal Medicine

## 2022-01-15 ENCOUNTER — Inpatient Hospital Stay: Payer: Medicare Other

## 2022-01-15 DIAGNOSIS — Z7189 Other specified counseling: Secondary | ICD-10-CM | POA: Diagnosis not present

## 2022-01-15 DIAGNOSIS — D649 Anemia, unspecified: Secondary | ICD-10-CM | POA: Diagnosis not present

## 2022-01-15 DIAGNOSIS — Z515 Encounter for palliative care: Secondary | ICD-10-CM | POA: Diagnosis not present

## 2022-01-15 DIAGNOSIS — J9601 Acute respiratory failure with hypoxia: Secondary | ICD-10-CM | POA: Diagnosis not present

## 2022-01-15 DIAGNOSIS — J84112 Idiopathic pulmonary fibrosis: Secondary | ICD-10-CM | POA: Diagnosis not present

## 2022-01-15 DIAGNOSIS — A419 Sepsis, unspecified organism: Secondary | ICD-10-CM | POA: Diagnosis not present

## 2022-01-15 LAB — CBC
HCT: 26.7 % — ABNORMAL LOW (ref 36.0–46.0)
Hemoglobin: 8.6 g/dL — ABNORMAL LOW (ref 12.0–15.0)
MCH: 27 pg (ref 26.0–34.0)
MCHC: 32.2 g/dL (ref 30.0–36.0)
MCV: 83.7 fL (ref 80.0–100.0)
Platelets: 669 10*3/uL — ABNORMAL HIGH (ref 150–400)
RBC: 3.19 MIL/uL — ABNORMAL LOW (ref 3.87–5.11)
RDW: 16.5 % — ABNORMAL HIGH (ref 11.5–15.5)
WBC: 10.7 10*3/uL — ABNORMAL HIGH (ref 4.0–10.5)
nRBC: 0 % (ref 0.0–0.2)

## 2022-01-15 LAB — BASIC METABOLIC PANEL
Anion gap: 7 (ref 5–15)
BUN: 9 mg/dL (ref 8–23)
CO2: 27 mmol/L (ref 22–32)
Calcium: 8 mg/dL — ABNORMAL LOW (ref 8.9–10.3)
Chloride: 100 mmol/L (ref 98–111)
Creatinine, Ser: 0.46 mg/dL (ref 0.44–1.00)
GFR, Estimated: >60 mL/min (ref >60)
Glucose, Bld: 87 mg/dL (ref 70–99)
Potassium: 3.7 mmol/L (ref 3.5–5.1)
Sodium: 134 mmol/L — ABNORMAL LOW (ref 135–145)

## 2022-01-15 LAB — RHEUMATOID FACTOR: Rheumatoid fact SerPl-aCnc: 363.1 IU/mL — ABNORMAL HIGH (ref <14.0)

## 2022-01-15 LAB — C-REACTIVE PROTEIN: CRP: 19.3 mg/dL — ABNORMAL HIGH (ref <1.0)

## 2022-01-15 LAB — MAGNESIUM: Magnesium: 1.5 mg/dL — ABNORMAL LOW (ref 1.7–2.4)

## 2022-01-15 MED ORDER — METHYLPREDNISOLONE 4 MG PO TBPK: 4.0000 mg | ORAL_TABLET | Freq: Three times a day (TID) | ORAL | Status: DC

## 2022-01-15 MED ORDER — METHYLPREDNISOLONE 4 MG PO TABS: 4.0000 mg | ORAL_TABLET | Freq: Every day | ORAL | Status: DC

## 2022-01-15 MED ORDER — METHYLPREDNISOLONE 4 MG PO TBPK: 4.0000 mg | ORAL_TABLET | Freq: Four times a day (QID) | ORAL | Status: DC

## 2022-01-15 MED ORDER — METHYLPREDNISOLONE 4 MG PO TABS
8.0000 mg | ORAL_TABLET | Freq: Every day | ORAL | Status: AC
  Administered 2022-01-16: 21:00:00 8 mg via ORAL
  Filled 2022-01-15: qty 2

## 2022-01-15 MED ORDER — MAGNESIUM SULFATE 2 GM/50ML IV SOLN
2.0000 g | Freq: Once | INTRAVENOUS | Status: AC
  Administered 2022-01-15: 2 g via INTRAVENOUS
  Filled 2022-01-15: qty 50

## 2022-01-15 MED ORDER — METHYLPREDNISOLONE 4 MG PO TABS
8.0000 mg | ORAL_TABLET | Freq: Every day | ORAL | Status: AC
  Administered 2022-01-15: 8 mg via ORAL
  Filled 2022-01-15: qty 2

## 2022-01-15 MED ORDER — METHYLPREDNISOLONE 4 MG PO TABS
4.0000 mg | ORAL_TABLET | Freq: Every day | ORAL | Status: AC
  Administered 2022-01-15: 4 mg via ORAL
  Filled 2022-01-15: qty 1

## 2022-01-15 MED ORDER — METHYLPREDNISOLONE 4 MG PO TBPK: 8.0000 mg | ORAL_TABLET | Freq: Every evening | ORAL | Status: DC

## 2022-01-15 MED ORDER — METHYLPREDNISOLONE 4 MG PO TABS
8.0000 mg | ORAL_TABLET | Freq: Every day | ORAL | Status: AC
Start: 2022-01-15 — End: 2022-01-15
  Administered 2022-01-15: 8 mg via ORAL
  Filled 2022-01-15: qty 2

## 2022-01-15 MED ORDER — METHYLPREDNISOLONE 4 MG PO TBPK: 8.0000 mg | ORAL_TABLET | Freq: Every morning | ORAL | Status: DC

## 2022-01-15 MED ORDER — METHYLPREDNISOLONE 4 MG PO TABS: 4.0000 mg | ORAL_TABLET | Freq: Two times a day (BID) | ORAL | Status: DC

## 2022-01-15 MED ORDER — METHYLPREDNISOLONE 4 MG PO TBPK: 4.0000 mg | ORAL_TABLET | ORAL | Status: DC

## 2022-01-15 MED ORDER — AMOXICILLIN-POT CLAVULANATE 500-125 MG PO TABS
1.0000 | ORAL_TABLET | Freq: Three times a day (TID) | ORAL | Status: DC
Start: 2022-01-15 — End: 2022-01-16
  Administered 2022-01-15 – 2022-01-16 (×4): 500 mg via ORAL
  Filled 2022-01-15 (×6): qty 1

## 2022-01-15 MED ORDER — METHYLPREDNISOLONE 4 MG PO TABS
4.0000 mg | ORAL_TABLET | Freq: Three times a day (TID) | ORAL | Status: DC
  Filled 2022-01-15: qty 1

## 2022-01-15 MED ORDER — METHYLPREDNISOLONE 4 MG PO TABS
4.0000 mg | ORAL_TABLET | Freq: Four times a day (QID) | ORAL | Status: DC
  Administered 2022-01-17 (×2): 4 mg via ORAL
  Filled 2022-01-15 (×4): qty 1

## 2022-01-15 MED ORDER — METHYLPREDNISOLONE 4 MG PO TABS
4.0000 mg | ORAL_TABLET | Freq: Every day | ORAL | Status: AC
  Filled 2022-01-15 (×3): qty 1

## 2022-01-15 MED ORDER — METHYLPREDNISOLONE 4 MG PO TABS
4.0000 mg | ORAL_TABLET | Freq: Three times a day (TID) | ORAL | Status: AC
  Administered 2022-01-16 (×3): 4 mg via ORAL

## 2022-01-15 NOTE — TOC Progression Note (Addendum)
Transition of Care [facility]) - Progression Note    Patient Details  Name: Erin [name] MRN: [id number] Date of Birth: [birth year]  Transition of Care [facility]) CM/SW [place], [place] Phone Number: 01/15/2022, 2:41 PM  Clinical Narrative:     Update: Patient's health care agent [name] will be at hospital at [facility] to discuss with [name] and this CSW plan, he reports he believes Ghada wishes to go back to [place] with hospice. CSW to update [facility] after meeting to determine dispo.    CSW spoke with health care agent [name], who confirms patient has negative hx of snf and believes she would want ot go back to [facility], would like CSW to reach out to them to see if she could return.   CSW did provide only bed offer of [facility] to patient,  she does NOT want her only bed offer of [facility]. she says she was set on [facility] and I re itterated they have no beds. I offered to try to see if she can go back to [facility]. she kept saying she was confused and wants me to talk to her later.  CSW has reached out to [place] at [place] to inquire if patient could return if she wishes, pending response at this time.   Palliative NP following for dispo as well.  Expected Discharge Plan: Skilled Nursing Facility Barriers to Discharge: Continued Medical Work up  Expected Discharge Plan and Services Expected Discharge Plan: [place]                                               Social Determinants of Health (SDOH) Interventions    Readmission Risk Interventions Readmission Risk Prevention Plan 05/29/2019  Transportation Screening Not Complete  Transportation Screening Comment Pt was independent and driving prior to getting sick, when she become sick her cousin provided transportation however cousin is now sick - will need to address closer to [place] or Home Care Consult Patient refused  Medication Review (RN Care Manager) Complete  Some recent data might be hidden

## 2022-01-15 NOTE — Progress Notes (Signed)
PULMONOLOGY         Date: 01/15/2022,   MRN# [id number] Erin [name] Jun 02, 1946     AdmissionWeight: 63.5 kg                 CurrentWeight: 63.5 kg   Referring physician: Dr Si Raider   CHIEF COMPLAINT:   Acute exacerbation of ILD with screroderma   HISTORY OF PRESENT ILLNESS   This is a pleasant patient with hx of RA GERD cad, HCC, came in for acute on set hypoxemia and febrile illness. She required 6l/min to reach normoxia in ED and was sent to ER from ALF.  She is being treated for pneumonia.  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   She has severe anemia and received trasnfusion today.   01/14/22- patient is resting comfortably in bed , shes improved clinically weaning O2.  Plan to continue current care till tommorow.    01/15/22- patient is clear to auscultation, she reports feeling much better and wants to work with PT today. We will try to get her off the supplemental O2 today. May initiate DC planning from pulm perspective. X ray shows multiple age sub acute rib fractures on right.   PAST MEDICAL HISTORY   Past Medical History:  Diagnosis Date   Anemia    h/o   Cancer ([place])    melanoma x 3the last one was of her leg and surgeon removed it   Colon polyp    Complication of anesthesia    PT STATED THAT WITH JOINT SURGERY THAT SHE WAS GIVEN SOME TYPE OF ANESTHESIA THAT MADE HER HALLUCINATE    Coronary artery calcification    Coronary artery disease    DDD (degenerative disc disease), lumbar    Diverticulosis 01/20/2017   Dysphagia    Dyspnea    with exertion-unable to walk a mile without getting sob- dr sparks set pt up to see Dr Rockey Situ after 11-19-16 surgery   Environmental allergies    Esophageal dysmotility    Fibrocystic breast disease    GERD (gastroesophageal reflux disease)    Heart murmur    asymptomatic   Hyperlipidemia    unspecified   Hypertension    Mitral regurgitation    Monilial esophagitis  ([place]) 01/20/2017   Obstipation    Osteopenia    Peptic ulcer disease    Pulmonary fibrosis ([place])    per Dr Raul Del   RA (rheumatoid arthritis) ([place])    Raynaud's disease    Redundant colon 01/20/2017   Scleroderma ([place])    Telangiectasia of colon    Tubular adenoma of colon 01/20/2017   unspecified   Vulvar dysplasia      SURGICAL HISTORY   Past Surgical History:  Procedure Laterality Date   ABDOMINAL HYSTERECTOMY     BREAST CYST EXCISION Left 20+ years ago   No scar visible   BREAST LUMPECTOMY Right    CARDIAC CATHETERIZATION     CARPAL TUNNEL RELEASE Bilateral    COLON SURGERY     colon polyp    COLONOSCOPY  09/30/2011   tubular adenoma rtm   COLONOSCOPY     05/02/2003, 04/10/2000   COLONOSCOPY WITH PROPOFOL N/A 01/20/2017   Procedure: COLONOSCOPY WITH PROPOFOL;  Surgeon: Lollie Sails, MD;  Location: [facility] ENDOSCOPY;  Service: Endoscopy;  Laterality: N/A;   COLONOSCOPY WITH PROPOFOL N/A 09/17/2020   Procedure: COLONOSCOPY WITH PROPOFOL;  Surgeon: Lesly Rubenstein, MD;  Location: [facility] ENDOSCOPY;  Service: Endoscopy;  Laterality: N/A;   CORONARY ATHERECTOMY N/A 11/21/2020   Procedure: CORONARY ATHERECTOMY;  Surgeon: Wellington Hampshire, MD;  Location: [place] CV LAB;  Service: Cardiovascular;  Laterality: N/A;   CORONARY STENT INTERVENTION  11/21/2020   CORONARY STENT INTERVENTION N/A 11/21/2020   Procedure: CORONARY STENT INTERVENTION;  Surgeon: Wellington Hampshire, MD;  Location: [place] CV LAB;  Service: Cardiovascular;  Laterality: N/A;   ESOPHAGOGASTRODUODENOSCOPY     ESOPHAGOGASTRODUODENOSCOPY     09/30/2011, 04/10/2000 , no repeat rtm   ESOPHAGOGASTRODUODENOSCOPY N/A 03/02/2018   Procedure: ESOPHAGOGASTRODUODENOSCOPY (EGD);  Surgeon: Virgel Manifold, MD;  Location: [facility] ENDOSCOPY;  Service: Endoscopy;  Laterality: N/A;   ESOPHAGOGASTRODUODENOSCOPY (EGD) WITH PROPOFOL N/A 01/20/2017   Procedure: ESOPHAGOGASTRODUODENOSCOPY (EGD) WITH PROPOFOL;  Surgeon:  Lollie Sails, MD;  Location: [facility] ENDOSCOPY;  Service: Endoscopy;  Laterality: N/A;   ESOPHAGOGASTRODUODENOSCOPY (EGD) WITH PROPOFOL N/A 09/17/2020   Procedure: ESOPHAGOGASTRODUODENOSCOPY (EGD) WITH PROPOFOL;  Surgeon: Lesly Rubenstein, MD;  Location: [facility] ENDOSCOPY;  Service: Endoscopy;  Laterality: N/A;   EXCISION HYDRADENITIS LABIA Left 11/19/2016   Procedure: EXCISION LABIAL MASS;  Surgeon: Robert Bellow, MD;  Location: [facility] ORS;  Service: General;  Laterality: Left;   EYE SURGERY Bilateral    cataracts   HEEL SPUR EXCISION     HIP ARTHROPLASTY Left 02/23/2018   Procedure: ARTHROPLASTY BIPOLAR HIP (HEMIARTHROPLASTY);  Surgeon: Thornton Park, MD;  Location: [facility] ORS;  Service: Orthopedics;  Laterality: Left;   IR KYPHO LUMBAR INC FX REDUCE BONE BX UNI/BIL CANNULATION INC/IMAGING  12/16/2021   KYPHOPLASTY N/A 04/26/2019   Procedure: KYPHOPLASTY T7;  Surgeon: Hessie Knows, MD;  Location: [facility] ORS;  Service: Orthopedics;  Laterality: N/A;   KYPHOPLASTY N/A 05/17/2019   Procedure: KYPHOPLASTY T8;  Surgeon: Hessie Knows, MD;  Location: [facility] ORS;  Service: Orthopedics;  Laterality: N/A;   KYPHOPLASTY N/A 07/26/2019   Procedure: T-9 KYPHOPLASTY;  Surgeon: Hessie Knows, MD;  Location: [facility] ORS;  Service: Orthopedics;  Laterality: N/A;   MASS EXCISION Left 11/19/2016   Procedure: EXCISION LEFT THIGH MELANOMA;  Surgeon: Robert Bellow, MD;  Location: [facility] ORS;  Service: General;  Laterality: Left;   RIGHT/LEFT HEART CATH AND CORONARY ANGIOGRAPHY N/A 11/15/2020   Procedure: RIGHT/LEFT HEART CATH AND CORONARY ANGIOGRAPHY poss intervention;  Surgeon: Minna Merritts, MD;  Location: [place] CV LAB;  Service: Cardiovascular;  Laterality: N/A;   SENTINEL NODE BIOPSY Left 11/19/2016   Procedure: INGUINAL SENTINEL NODE BIOPSY;  Surgeon: Robert Bellow, MD;  Location: [facility] ORS;  Service: General;  Laterality: Left;   UPPER ESOPHAGEAL ENDOSCOPIC ULTRASOUND (EUS) N/A 01/29/2017    Procedure: UPPER ESOPHAGEAL ENDOSCOPIC ULTRASOUND (EUS);  Surgeon: Holly Bodily, MD;  Location: [facility] ENDOSCOPY;  Service: Endoscopy;  Laterality: N/A;     FAMILY HISTORY   Family History  Problem Relation Age of Onset   Endometrial cancer Mother 23   Hypertension Mother    Osteoporosis Mother    Bladder Cancer Father    COPD Father    Cerebral palsy Sister      SOCIAL HISTORY   Social History   Tobacco Use   Smoking status: Never   Smokeless tobacco: Never  Vaping Use   Vaping Use: Never used  Substance Use Topics   Alcohol use: No   Drug use: No     MEDICATIONS    Home Medication:    Current Medication:  Current Facility-Administered Medications:    acetaminophen (TYLENOL) tablet 650  mg, 650 mg, Oral, Q6H PRN, Agbata, Tochukwu, MD, 650 mg at 01/14/22 1635   ALPRAZolam (XANAX) tablet 0.5 mg, 0.5 mg, Oral, Q12H PRN, Wouk, Ailene Rud, MD   atorvastatin (LIPITOR) tablet 40 mg, 40 mg, Oral, QHS, Agbata, Tochukwu, MD, 40 mg at 01/14/22 2108   budesonide (PULMICORT) nebulizer solution 0.5 mg, 0.5 mg, Nebulization, BID, Agbata, Tochukwu, MD, 0.5 mg at 01/15/22 0759   calcitonin (salmon) (MIACALCIN/FORTICAL) nasal spray 1 spray, 1 spray, Alternating Nares, Daily, Agbata, Tochukwu, MD, 1 spray at 01/14/22 0931   ceFEPIme (MAXIPIME) 2 g in sodium chloride 0.9 % 100 mL IVPB, 2 g, Intravenous, Q12H, Oswald Hillock, RPH, Last Rate: 200 mL/hr at 01/15/22 0351, Infusion Verify at 01/15/22 0351   Chlorhexidine Gluconate Cloth 2 % PADS 6 each, 6 each, Topical, Daily, Wouk, Ailene Rud, MD, 6 each at 01/14/22 1400   DULoxetine (CYMBALTA) DR capsule 60 mg, 60 mg, Oral, Daily, Agbata, Tochukwu, MD, 60 mg at 01/14/22 0840   enoxaparin (LOVENOX) injection 40 mg, 40 mg, Subcutaneous, Q24H, Wouk, Ailene Rud, MD, 40 mg at 01/14/22 1228   ezetimibe (ZETIA) tablet 10 mg, 10 mg, Oral, QHS, Agbata, Tochukwu, MD, 10 mg at 01/14/22 2109   feeding supplement (ENSURE ENLIVE / ENSURE  PLUS) liquid 237 mL, 237 mL, Oral, BID BM, Wouk, Ailene Rud, MD, 237 mL at 01/14/22 1639   furosemide (LASIX) injection 20 mg, 20 mg, Intravenous, BID, Lanney Gins, Guiliana Shor, MD, 20 mg at 01/14/22 1635   gabapentin (NEURONTIN) capsule 300 mg, 300 mg, Oral, TID, Wouk, Ailene Rud, MD, 300 mg at 01/14/22 2110   hydroxychloroquine (PLAQUENIL) tablet 200 mg, 200 mg, Oral, BID, Agbata, Tochukwu, MD, 200 mg at 01/14/22 2109   ipratropium-albuterol (DUONEB) 0.5-2.5 (3) MG/3ML nebulizer solution 3 mL, 3 mL, Nebulization, Q6H PRN, Agbata, Tochukwu, MD, 3 mL at 01/14/22 1957   iron polysaccharides (NIFEREX) capsule 150 mg, 150 mg, Oral, Daily, Agbata, Tochukwu, MD, 150 mg at 01/14/22 0846   isosorbide mononitrate (IMDUR) 24 hr tablet 15 mg, 15 mg, Oral, Daily, Wouk, Ailene Rud, MD, 15 mg at 01/14/22 0844   latanoprost (XALATAN) 0.005 % ophthalmic solution 1 drop, 1 drop, Both Eyes, QHS, Agbata, Tochukwu, MD, 1 drop at 01/13/22 2133   lip balm (BLISTEX) ointment, , Topical, PRN, Wouk, Ailene Rud, MD   MEDLINE mouth rinse, 15 mL, Mouth Rinse, BID, Wouk, Ailene Rud, MD, 15 mL at 01/14/22 1244   melatonin tablet 2.5 mg, 2.5 mg, Oral, QHS PRN, Foust, Katy L, NP, 2.5 mg at 01/14/22 2110   metoprolol tartrate (LOPRESSOR) tablet 12.5 mg, 12.5 mg, Oral, BID, Wouk, Ailene Rud, MD, 12.5 mg at 01/14/22 2108   multivitamin with minerals tablet 1 tablet, 1 tablet, Oral, Daily, Agbata, Tochukwu, MD, 1 tablet at 01/14/22 0840   nitroGLYCERIN (NITROSTAT) SL tablet 0.4 mg, 0.4 mg, Sublingual, Q5 min PRN, Agbata, Tochukwu, MD   ondansetron (ZOFRAN) tablet 4 mg, 4 mg, Oral, Q6H PRN **OR** ondansetron (ZOFRAN) injection 4 mg, 4 mg, Intravenous, Q6H PRN, Agbata, Tochukwu, MD, 4 mg at 01/14/22 0850   pantoprazole (PROTONIX) injection 40 mg, 40 mg, Intravenous, Q12H, Wouk, Ailene Rud, MD, 40 mg at 01/14/22 2110   polyethylene glycol (MIRALAX / GLYCOLAX) packet 17 g, 17 g, Oral, Daily PRN, Agbata, Tochukwu, MD   spironolactone  (ALDACTONE) tablet 25 mg, 25 mg, Oral, Daily, Lanney Gins, Ahmadou Bolz, MD, 25 mg at 01/14/22 0845    ALLERGIES   Remicade [infliximab], Sulfa antibiotics, Actonel [risedronate sodium], Fosamax [alendronate sodium], Procardia [nifedipine], Elemental  sulfur, and Wellbutrin [bupropion]     REVIEW OF SYSTEMS    Review of Systems:  Gen:  Denies  fever, sweats, chills weigh loss  HEENT: Denies blurred vision, double vision, ear pain, eye pain, hearing loss, nose bleeds, sore throat Cardiac:  No dizziness, chest pain or heaviness, chest tightness,edema Resp:   Denies cough or sputum porduction, shortness of breath,wheezing, hemoptysis,  Gi: Denies swallowing difficulty, stomach pain, nausea or vomiting, diarrhea, constipation, bowel incontinence Gu:  Denies bladder incontinence, burning urine Ext:   Denies Joint pain, stiffness or swelling Skin: Denies  skin rash, easy bruising or bleeding or hives Endoc:  Denies polyuria, polydipsia , polyphagia or weight change Psych:   Denies depression, insomnia or hallucinations   Other:  All other systems negative   VS: BP (!) 115/58 (BP Location: Right Arm)    Pulse 85    Temp 99.1 F (37.3 C) (Oral)    Resp 16    Ht 5' (1.524 m)    Wt 63.5 kg    LMP  (LMP Unknown)    SpO2 97%    BMI 27.34 kg/m      PHYSICAL EXAM    GENERAL:NAD, no fevers, chills, no weakness no fatigue HEAD: Normocephalic, atraumatic.  EYES: Pupils equal, round, reactive to light. Extraocular muscles intact. No scleral icterus.  MOUTH: Moist mucosal membrane. Dentition intact. No abscess noted.  EAR, NOSE, THROAT: Clear without exudates. No external lesions.  NECK: Supple. No thyromegaly. No nodules. No JVD.  PULMONARY: mild ronchi bilaterally  CARDIOVASCULAR: S1 and S2. Regular rate and rhythm. No murmurs, rubs, or gallops. No edema. Pedal pulses 2+ bilaterally.  GASTROINTESTINAL: Soft, nontender, nondistended. No masses. Positive bowel sounds. No hepatosplenomegaly.   MUSCULOSKELETAL: No swelling, clubbing, or edema. Range of motion full in all extremities.  NEUROLOGIC: Cranial nerves II through XII are intact. No gross focal neurological deficits. Sensation intact. Reflexes intact.  SKIN: No ulceration, lesions, rashes, or cyanosis. Skin warm and dry. Turgor intact.  PSYCHIATRIC: Mood, affect within normal limits. The patient is awake, alert and oriented x 3. Insight, judgment intact.       IMAGING    CT ABDOMEN PELVIS WO CONTRAST  Result Date: 01/12/2022 CLINICAL DATA:  Abdominal pain, acute. EXAM: CT ABDOMEN AND PELVIS WITHOUT CONTRAST TECHNIQUE: Multidetector CT imaging of the abdomen and pelvis was performed following the standard protocol without IV contrast. RADIATION DOSE REDUCTION: This exam was performed according to the departmental dose-optimization program which includes automated exposure control, adjustment of the mA and/or kV according to patient size and/or use of iterative reconstruction technique. COMPARISON:  CT pelvis 12/09/2021.  Chest radiography same day. FINDINGS: Lower chest: Bilateral pleural effusions layering dependently with dependent atelectasis. Some areas of pulmonary scarring and some pleural emphysematous change related to chronic lung disease. The heart is enlarged. There are coronary artery calcifications. Hepatobiliary: Liver parenchyma is normal. Question sludge or small stones dependent in the gallbladder. Pancreas: Normal Spleen: Normal Adrenals/Urinary Tract: Adrenal glands are normal. Kidneys are normal. No cyst, mass, stone or hydronephrosis. Foley catheter in the bladder. Stomach/Bowel: Stomach and small intestine are normal. Moderate amount of stool and gas within the colon but no acute finding. Vascular/Lymphatic: Aortic atherosclerosis. No aneurysm. IVC is normal. No adenopathy. Reproductive: Limited detail because of artifact from the hip replacement. No evidence of pelvic mass. Other: No free fluid or air.  Musculoskeletal: Previous vertebral augmentations at T9 and L1. Lower lumbar degenerative changes. Previous hip replacement on the left. Fatty atrophy of  the thigh musculature on right. IMPRESSION: No acute finding to explain abdominal pain. Bilateral pleural effusions layering dependently with dependent atelectasis. Chronic lung disease. Cardiomegaly, coronary artery calcification and aortic atherosclerotic calcification. Question small amount of sludge or small stones dependent in the gallbladder. Consider ultrasound correlation. Moderate amount of stool and gas in the colon, but not out of the range of normal. Electronically Signed   By: Nelson Chimes M.D.   On: 01/12/2022 15:53   DG Chest 1 View  Result Date: 01/12/2022 CLINICAL DATA:  Respiratory distress EXAM: CHEST  1 VIEW COMPARISON:  January 12, 2022 at 11:52 a.m. FINDINGS: Bilateral patchy pulmonary opacities persists but appears somewhat improved in the interval. The cardiomediastinal silhouette is stable. No pneumothorax. No other changes. IMPRESSION: Persistent but mildly improved bilateral patchy pulmonary infiltrates. Electronically Signed   By: Dorise Bullion III M.D.   On: 01/12/2022 13:58   DG Shoulder Right  Result Date: 01/14/2022 CLINICAL DATA:  Recent fall EXAM: RIGHT SHOULDER - 2+ VIEW COMPARISON:  Chest x-ray 01/12/2022, 05/16/2021 FINDINGS: No fracture or malalignment. Mild AC joint degenerative change. Multiple age indeterminate right-sided rib fractures. IMPRESSION: 1. No acute osseous abnormality at the shoulder. 2. Multiple age indeterminate right-sided rib fractures Electronically Signed   By: Donavan Foil M.D.   On: 01/14/2022 19:24   MR Lumbar Spine W Wo Contrast  Result Date: 01/13/2022 CLINICAL DATA:  Fever.  Recent L1 kyphoplasty. EXAM: MRI LUMBAR SPINE WITHOUT AND WITH CONTRAST TECHNIQUE: Multiplanar and multiecho pulse sequences of the lumbar spine were obtained without and with intravenous contrast. CONTRAST:  64mL  GADAVIST GADOBUTROL 1 MMOL/ML IV SOLN COMPARISON:  MRI lumbar spine dated December 09, 2021. FINDINGS: Segmentation:  Standard. Alignment: Unchanged mild retrolisthesis at L2-L3. Unchanged trace anterolisthesis at L4-L5 and L5-S1. Vertebrae: Interval cement augmentation at L1 with mild residual marrow edema. No progressive height loss. No new fracture, evidence of discitis, or suspicious bone lesion. Conus medullaris and cauda equina: Conus extends to the L2 level. Conus and cauda equina appear normal. No intrathecal enhancement. Paraspinal and other soft tissues: Small 1.7 cm intrinsically T1 hyperintense fluid collection with low T2 and T1 signal rim adjacent to the left L1 posterior elements, most consistent with a small hematoma. Disc levels: T12-L1:  Negative. L1-L2: Unchanged mild retropulsion of the L1 posteroinferior endplate. No significant disc bulge or herniation. No stenosis. L2-L3: Unchanged asymmetric left-sided disc bulging and endplate spurring. Unchanged moderate left neuroforaminal stenosis. No spinal canal or right neuroforaminal stenosis. L3-L4: Unchanged small broad-based posterior disc protrusion and mild bilateral facet arthropathy. No stenosis. L4-L5: Unchanged small broad-based posterior disc protrusion and moderate bilateral facet arthropathy. No stenosis. L5-S1: Unchanged small broad-based posterior disc protrusion eccentric to the right. Severe right and mild left facet arthropathy with bulky ligamentum flavum hypertrophy. New prominent right perifacet marrow edema. Unchanged mild right lateral recess stenosis and moderate right neuroforaminal stenosis. No spinal canal or left neuroforaminal stenosis. IMPRESSION: 1. Interval cement augmentation at L1 without progressive height loss. No new fracture or other acute abnormality. 2. Multilevel lumbar spondylosis as described above, overall similar to prior study. Unchanged moderate left neuroforaminal stenosis at L2-L3 and moderate right  neuroforaminal stenosis at L5-S1. 3. Progressive severe right facet arthropathy at L5-S1 with new degenerative inflammatory change, which can be a source of pain. Electronically Signed   By: Titus Dubin M.D.   On: 01/13/2022 15:57   DG Chest Port 1 View  Result Date: 01/12/2022 CLINICAL DATA:  76 year old female with possible sepsis.  EXAM: PORTABLE CHEST 1 VIEW COMPARISON:  CTA chest 05/28/2019 and earlier. FINDINGS: Portable AP upright view at 1153 hours. Chronically reduced lung volumes with evidence of interstitial lung disease on 2020 CT. Multifocal bilateral patchy and confluent new pulmonary opacity since radiographs in June 2020. No superimposed pneumothorax or pleural effusion. No definite consolidation. Previously augmented thoracic and lumbar compression fractures. Mediastinal contours remain normal. Visualized tracheal air column is within normal limits. No acute osseous abnormality identified. Negative visible bowel gas. IMPRESSION: Multifocal bilateral patchy lung opacity opacity superimposed on suspected chronic interstitial lung disease with diminished lung volumes. Favor acute bilateral pneumonia in the setting of suspected sepsis. No pleural effusion. Electronically Signed   By: Genevie Ann M.D.   On: 01/12/2022 12:07   IR KYPHO LUMBAR INC FX REDUCE BONE BX UNI/BIL CANNULATION INC/IMAGING  Result Date: 12/16/2021 CLINICAL DATA:  History of fall. Symptomatic L1 compression fracture deformity. Failed conservative medical management EXAM: FLUOROSCOPIC GUIDED KYPHOPLASTY OF L1 VERTEBRAL BODY COMPARISON:  MR L-spine and lumbar XRs, 12/09/2021. MEDICATIONS: As antibiotic prophylaxis, Ancef 2 g IV was ordered pre-procedure and administered intravenously within 1 hour of incision. ANESTHESIA/SEDATION: Moderate (conscious) sedation was employed during this procedure. A total of Versed 4 mg and Fentanyl 200 mcg was administered intravenously. Moderate Sedation Time: 72 minutes. The patient's level of  consciousness and vital signs were monitored continuously by radiology nursing throughout the procedure under my direct supervision. FLUOROSCOPY TIME:  12 minutes, 36 seconds (366.4 mGy) COMPLICATIONS: None immediate. TECHNIQUE: The procedure, risks (including but not limited to bleeding, infection, organ damage), benefits, and alternatives were explained to the patient. Questions regarding the procedure were encouraged and answered. The patient understands and consents to the procedure. The patient was placed prone on the fluoroscopic table. The skin overlying the upper thoracic region was then prepped and draped in the usual sterile fashion. Maximal barrier sterile technique was utilized including caps, mask, sterile gowns, sterile gloves, sterile drape, hand hygiene and skin antiseptic. Intravenous Fentanyl and Versed were administered as conscious sedation during continuous cardiorespiratory monitoring by the radiology RN. The LEFT pedicle at L1 vertebral body was then infiltrated with 1% lidocaine followed by the advancement of a Kyphon trocar needle transpedicular at the LEFT pedicle into the posterior one-third of the vertebral body. Subsequently, the osteo drill was advanced to the anterior third of the vertebral body. The osteo drill was retracted. The maneuvers were repeated for trocar needle insertion into the RIGHT pedicle at L1 vertebral body. Through the working cannulas, Kyphon inflatable bone tamps 15 x 3 was advanced and positioned with the distal marker approximately 5 mm from the anterior aspect of the cortex. Appropriate positioning was confirmed on the AP projection. At this time, the balloons were expanded using contrast via a Kyphon inflation syringe device via micro tubing. Inflations was continued until there was near apposition with the superior end plate. At this time, methylmethacrylate (PMMA) mixture was reconstituted in the Kyphon bone mixing device system. This was then loaded into the  delivery mechanism, attached to Kyphon bone filler. The balloons were deflated and removed followed by the instillation of methylmethacrylate mixture with excellent filling in the AP and lateral projections. Approximately 8 mL PMMA was administered. No extravasation was noted in the disk spaces or posteriorly into the spinal canal. No epidural venous contamination was seen. The working cannula and the bone filler was then retrieved and removed. Hemostasis was achieved with manual compression. The patient tolerated the procedure well without immediate postprocedural complication. IMPRESSION:  Successful L1 vertebral body augmentation using balloon kyphoplasty, using a bi-pedicular approach, as above. Michaelle Birks, MD [facility] Electronically Signed   By: Michaelle Birks M.D.   On: 12/16/2021 17:51      ASSESSMENT/PLAN   Acute hypoxemic respiratory failure   Likely due to acute exacerbation of ILD/pulmonary fibrosis    - etiology may be LRTI with viral infection    - Resp viral panel ordered    - rheumatoid factor    - CRP trend    -PCT is low 0.15     -agree with plaqenil 200 bid     - medrol dose pack started post bid Soluemedrol , repeat CXR today   Severe normocytic anemia    - s/p blood transfusion      Patietn denies blood in stool or urine   Thank you for allowing me to participate in the care of this patient.   Patient/Family are satisfied with care plan and all questions have been answered.  This document was prepared using Dragon voice recognition software and may include unintentional dictation errors.     Ottie Glazier, M.D.  Division of [place]

## 2022-01-15 NOTE — Evaluation (Signed)
Occupational Therapy Evaluation Patient Details Name: Erin [name] MRN: [id number] DOB: 02/27/1946 Today's Date: 01/15/2022   History of Present Illness Erin [name] is a 76 y.o. female with medical history significant for hypertension, coronary artery disease, GERD who was sent from the assisted living facility where she currently resides to the ER for evaluation of fever and hypoxia.   Clinical Impression   [name] was seen for OT evaluation this date. Prior to hospital admission, pt recently moved in to [place], utilizes RW for mobility. Pt has baseline dementia and is poor historian. Pt presents to acute OT demonstrating impaired ADL performance and functional mobility 2/2 decreased activity tolerance and functional strength/ROM/balance deficits. Upon arrival pt in bed, requesting assistance with breakfast setup, agreeable to OOB to chair. Pt currently requires MIN A exit L side of bed. MOD A for LB access seated EOB. MIN A + RW for bed>chair step pivot t/f. SETUP self-feeding seated in chair. RN in at end of session. Pt would benefit from skilled OT to address noted impairments and functional limitations (see below for any additional details). Upon hospital discharge, recommend STR to maximize pt safety and return to PLOF.       Recommendations for follow up therapy are one component of a multi-disciplinary discharge planning process, led by the attending physician.  Recommendations may be updated based on patient status, additional functional criteria and insurance authorization.   Follow Up Recommendations  Skilled nursing-short term rehab (<3 hours/day)    Assistance Recommended at Discharge Frequent or constant Supervision/Assistance  Patient can return home with the following A little help with walking and/or transfers;A little help with bathing/dressing/bathroom;Help with stairs or ramp for entrance;Assistance with cooking/housework    Functional Status Assessment   Patient has had a recent decline in their functional status and demonstrates the ability to make significant improvements in function in a reasonable and predictable amount of time.  Equipment Recommendations  BSC/3in1    Recommendations for Other Services       Precautions / Restrictions Precautions Precautions: Fall Restrictions Weight Bearing Restrictions: No      Mobility Bed Mobility Overal bed mobility: Needs Assistance Bed Mobility: Supine to Sit     Supine to sit: Min assist          Transfers Overall transfer level: Needs assistance Equipment used: Rolling walker (2 wheels) Transfers: Sit to/from Stand, Bed to chair/wheelchair/BSC Sit to Stand: Min assist     Step pivot transfers: Min assist            Balance Overall balance assessment: Needs assistance Sitting-balance support: No upper extremity supported, Feet supported Sitting balance-Leahy Scale: Fair     Standing balance support: Bilateral upper extremity supported, Reliant on assistive device for balance, During functional activity Standing balance-Leahy Scale: Fair                             ADL either performed or assessed with clinical judgement   ADL Overall ADL's : Needs assistance/impaired                                       General ADL Comments: SETUP self-feeding seated in chair. MIN A + RW for simulated BSC t/f. MOD A for LB access seated EOB.      Pertinent Vitals/Pain Pain Assessment Pain Assessment: No/denies pain  Hand Dominance Right   Extremity/Trunk Assessment Upper Extremity Assessment Upper Extremity Assessment: Generalized weakness   Lower Extremity Assessment Lower Extremity Assessment: Generalized weakness       Communication Communication Communication: No difficulties   Cognition Arousal/Alertness: Awake/alert Behavior During Therapy: WFL for tasks assessed/performed Overall Cognitive Status: History of cognitive  impairments - at baseline                                       General Comments  SpO2 mid 90s t/o session on 1L [place]            Home Living Family/patient expects to be discharged to:: Skilled nursing facility                                 Additional Comments: Moved into [place] last weekend just before coming into hospital      Prior Functioning/Environment Prior Level of Function : Needs assist       Physical Assist : Mobility (physical);ADLs (physical)     Mobility Comments: Pt reports ambulating short distances with RW with assist since previous admission.          OT Problem List: Decreased strength;Decreased range of motion;Decreased activity tolerance;Impaired balance (sitting and/or standing);Decreased safety awareness      OT Treatment/Interventions: Self-care/ADL training;Therapeutic exercise;Energy conservation;DME and/or AE instruction;Therapeutic activities;Patient/family education;Balance training    OT Goals(Current goals can be found in the care plan section) Acute Rehab OT Goals Patient Stated Goal: to return to PLOF OT Goal Formulation: With patient Time For Goal Achievement: 01/29/22 Potential to Achieve Goals: Good ADL Goals Pt Will Perform Grooming: with modified independence;sitting Pt Will Perform Lower Body Dressing: with min assist;sit to/from stand Pt Will Transfer to Toilet: with min assist;ambulating;bedside commode  OT Frequency: Min 2X/week    Co-evaluation              AM-PAC OT "6 Clicks" Daily Activity     Outcome Measure Help from another person eating meals?: A Little Help from another person taking care of personal grooming?: A Little Help from another person toileting, which includes using toliet, bedpan, or urinal?: A Lot Help from another person bathing (including washing, rinsing, drying)?: A Lot Help from another person to put on and taking off regular upper body clothing?: A  Little Help from another person to put on and taking off regular lower body clothing?: A Lot 6 Click Score: 15   End of Session Equipment Utilized During Treatment: Rolling walker (2 wheels) Nurse Communication: Mobility status  Activity Tolerance: Patient tolerated treatment well Patient left: in chair;with call bell/phone within reach;with chair alarm set;with nursing/sitter in room  OT Visit Diagnosis: Other abnormalities of gait and mobility (R26.89);Muscle weakness (generalized) (M62.81)                Time: 0932-3557 OT Time Calculation (min): 16 min Charges:  OT General Charges $OT Visit: 1 Visit OT Evaluation $OT Eval Low Complexity: 1 Low OT Treatments $Self Care/Home Management : 8-22 mins  Dessie Coma, M.S. OTR/L  01/15/22, 9:10 AM  ascom [phone]

## 2022-01-15 NOTE — Progress Notes (Signed)
Physical Therapy Treatment Patient Details Name: Erin [name] MRN: [id number] DOB: 15-Oct-1946 Today's Date: 01/15/2022   History of Present Illness Erin [name] is a 76 y.o. female with medical history significant for hypertension, coronary artery disease, GERD who was sent from the assisted living facility where she currently resides to the ER for evaluation of fever and hypoxia.    PT Comments    Duplicate order received, spoke with MD. Patient tolerated session well and was agreeable to treatment. Upon entering room patient was seated in recliner requesting to attempt to use BSC. Patient A&O x3. Patient reported no pain throughout. HR ranged from 77-80bpm throughout session. O2 >90% on 1L via [place] throughout session. Patient was able to perform lateral side stepping from recliner<>BSC at [facility] A. Verbal cueing was given on upright posture, hand placement on RW and BSC, and controlled descent onto [facility]. Mild-moderate unsteadiness noted with 1 posterior unannounced LOB back onto [facility] that required Mod A to control descent. Sit to stands from recliner and [facility] required Min A with cueing on hand placement with RW. Seated therapeutic exercises performed in recliner to help improve BLE weakness.    Recommendations for follow up therapy are one component of a multi-disciplinary discharge planning process, led by the attending physician.  Recommendations may be updated based on patient status, additional functional criteria and insurance authorization.  Follow Up Recommendations  Skilled nursing-short term rehab (<3 hours/day)     Assistance Recommended at Discharge Intermittent Supervision/Assistance  Patient can return home with the following A little help with walking and/or transfers;A little help with bathing/dressing/bathroom;Assistance with cooking/housework;Direct supervision/assist for financial management;Assist for transportation;Assistance with feeding;Direct supervision/assist for medications  management;Help with stairs or ramp for entrance   Equipment Recommendations  Other (comment) (defer to next level of care)    Recommendations for Other Services       Precautions / Restrictions Precautions Precautions: Fall Restrictions Weight Bearing Restrictions: No     Mobility  Bed Mobility               General bed mobility comments: not assessed as patient started and ended session in recliner    Transfers Overall transfer level: Needs assistance Equipment used: Rolling walker (2 wheels) Transfers: Sit to/from Stand, Bed to chair/wheelchair/BSC Sit to Stand: Min assist, Mod assist   Step pivot transfers: Min assist       General transfer comment: VC's for hand placement with poor carryover. Stood with minA and momentum. Good sequencing of RW with step pivot to recliner.    Ambulation/Gait Ambulation/Gait assistance:  (deferred for safety)                 Stairs             Wheelchair Mobility    Modified Rankin (Stroke Patients Only)       Balance Overall balance assessment: Needs assistance Sitting-balance support: No upper extremity supported, Feet supported Sitting balance-Leahy Scale: Fair     Standing balance support: Bilateral upper extremity supported, Reliant on assistive device for balance, During functional activity Standing balance-Leahy Scale: Fair Standing balance comment: Requires Heavy UE reliance on RW for support. Kyphotic and forward trunk lean.                            Cognition Arousal/Alertness: Awake/alert Behavior During Therapy: WFL for tasks assessed/performed Overall Cognitive Status: History of cognitive impairments - at baseline  General Comments: A&Ox3, situation, location, and year. Dementia at baseline. follows simple commands with 100% accuracy and appropriate conversation throughout.        Exercises General Exercises - Lower  Extremity Ankle Circles/Pumps: Seated, AROM, 20 reps, Right, Left Long Arc Quad: Seated, AROM, 20 reps, Right, Left, Both Straight Leg Raises: Seated, 5 reps, AROM, Strengthening, Right, Left, Both (performed in long sitting in recliner)    General Comments General comments (skin integrity, edema, etc.): 77-80bpm throughout session, O2>90% on 1L via [place]      Pertinent Vitals/Pain Pain Assessment Pain Assessment: No/denies pain Pain Intervention(s): Limited activity within patient's tolerance, Monitored during session, Repositioned    Home Living Family/patient expects to be discharged to:: Skilled nursing facility                   Additional Comments: Moved into [place] last weekend just before coming into hospital    Prior Function            PT Goals (current goals can now be found in the care plan section) Acute Rehab PT Goals PT Goal Formulation: Patient unable to participate in goal setting    Frequency    Min 2X/week      PT Plan Current plan remains appropriate    Co-evaluation              AM-PAC PT "6 Clicks" Mobility   Outcome Measure  Help needed turning from your back to your side while in a flat bed without using bedrails?: A Lot Help needed moving from lying on your back to sitting on the side of a flat bed without using bedrails?: A Lot Help needed moving to and from a bed to a chair (including a wheelchair)?: A Little Help needed standing up from a chair using your arms (e.g., wheelchair or bedside chair)?: A Little Help needed to walk in hospital room?: A Lot Help needed climbing 3-5 steps with a railing? : Total 6 Click Score: 13    End of Session Equipment Utilized During Treatment: Gait belt;Oxygen Activity Tolerance: Patient limited by fatigue;Treatment limited secondary to medical complications (Comment) Patient left: in chair;with nursing/sitter in room;with call bell/phone within reach;with family/visitor present;with chair  alarm set Nurse Communication: Mobility status PT Visit Diagnosis: Other abnormalities of gait and mobility (R26.89);Muscle weakness (generalized) (M62.81);Difficulty in walking, not elsewhere classified (R26.2)     Time: 7782-4235 PT Time Calculation (min) (ACUTE ONLY): 31 min  Charges:  $Therapeutic Exercise: 8-22 mins $Therapeutic Activity: 8-22 mins                     Iva Boop, PT  01/15/22. 11:25 AM

## 2022-01-15 NOTE — Consult Note (Signed)
Consultation Note Date: 01/15/2022   Patient Name: Erin [name]  DOB: 31-Aug-1946  MRN: [id number]  Age / Sex: 76 y.o., female  PCP: Idelle Crouch, MD Referring Physician: Lorella Nimrod, MD  Reason for Consultation: Establishing goals of care  HPI/Patient Profile: 76 y.o. female  with past medical history of idiopathic interstitial lung disease, scleroderma, rheumatoid arthritis, hypertension, coronary artery disease, GERD  admitted on 01/12/2022 with sepsis, interstitial lung disease.   Clinical Assessment and Goals of Care: I have reviewed medical records including EPIC notes, labs and imaging, received report from RN, assessed the patient.  Erin [name] is lying quietly in bed with bedside nursing staff attending to needs.  Staff finishes and leaves Erin [name] and I to our conversations.  I am beginning orientation questions when Erin [name] stepdaughter, Erin [name], arrives.   [name] is alert and oriented x3, able to make her needs known.  She appears acutely/chronically ill and quite frail.    We meet to discuss diagnosis prognosis, GOC, EOL wishes, disposition and options.  I introduced Palliative Medicine as specialized medical care for people living with serious illness. It focuses on providing relief from the symptoms and stress of a serious illness. The goal is to improve quality of life for both the patient and the family.  We discussed a brief life review of the patient.  Lee worked for 26 years at the [employer]  She was married to [place] father but he died in March 15, 2009.  Erin [name] shares that she feels that Andersen's declines started after her son who was a state trooper was electrocuted and died.  [name] shares her agreement.  She shares that her health started to decline after that.       We then focused on their current illness.  We talk about Erin [name]'s acute issue with respiratory failure pneumonia, the  treatment plan.  The natural disease trajectory and expectations at EOL were discussed.  [name] and [name] share their concern about disposition.  They share that Erin [name] is expecting to discharge to short-term rehab, but she is unsure if she should return to her own home with hospice and private pay caregivers or return to [place] where she was only living for a few days.  I encourage family to work closely with Education officer, museum at short-term rehab.  Advanced directives, concepts specific to code status, and rehospitalization were considered and discussed.  [name] endorses DNR.  She shares that her healthcare surrogate knows her wishes.  Hospice and Palliative Care services outpatient were explained and offered.  We talk in detail about outpatient palliative services and hospice services.  We also talked in detail about provider choice.  At this point stepdaughter, Erin [name], is going to work with [name] in choosing hospice/palliative provider.  Discussed the importance of continued conversation with family and the medical providers regarding overall plan of care and treatment options, ensuring decisions are within the context of the patients values and GOCs.  Questions and concerns were addressed.  The patient and family  was encouraged to call with questions or concerns.  PMT will continue to support holistically.  Conference with attending, bedside nursing staff, transition of care team related to patient condition, needs, goals of care, disposition.   HCPOA HCPOA -Mrs. Erin [name] names her pastor, Erin [name], as her legal healthcare surrogate.  She is a widow, her husband died in 02/27/17.  She tells me that she has 3 sons, one was a state trooper who died.  Her stepdaughter Erin [name] is active in her care    SUMMARY OF RECOMMENDATIONS   At this point continue to treat the treatable but no CPR or intubation. Agreeable to short-term rehab. Considering returning to [place], only live there a few  days Considering returning to her own home with hospice care and private pay caregivers   Code Status/Advance Care Planning: DNR -patient endorses DNR  Symptom Management:  Per hospitalist, no additional needs at this time.  Palliative Prophylaxis:  Oral Care and Palliative Wound Care  Additional Recommendations (Limitations, Scope, Preferences): Continue to treat the treatable but no CPR or intubation  Psycho-social/Spiritual:  Desire for further Chaplaincy support:no Additional Recommendations: Caregiving  Support/Resources and Education on Hospice  Prognosis:  < 6 months or less would not be surprising based on chronic illness burden, decreasing functional status, 2 hospital stays and 1 ED visit in 6 months.  Discharge Planning:  Anticipate short-term rehab then deciding whether to return to [facility] versus her own home with hospice care.       Primary Diagnoses: Present on Admission:  Sepsis ([place])  Chronic diastolic CHF (congestive heart failure) (HCC)  Depression with anxiety  Idiopathic interstitial fibrosis of lung syndrome (HCC)  Anemia  Acute respiratory failure (HCC)  Acute metabolic encephalopathy   I have reviewed the medical record, interviewed the patient and family, and examined the patient. The following aspects are pertinent.  Past Medical History:  Diagnosis Date   Anemia    h/o   Cancer ([place])    melanoma x 3the last one was of her leg and surgeon removed it   Colon polyp    Complication of anesthesia    PT STATED THAT WITH JOINT SURGERY THAT SHE WAS GIVEN SOME TYPE OF ANESTHESIA THAT MADE HER HALLUCINATE    Coronary artery calcification    Coronary artery disease    DDD (degenerative disc disease), lumbar    Diverticulosis 01/20/2017   Dysphagia    Dyspnea    with exertion-unable to walk a mile without getting sob- dr sparks set pt up to see Dr Rockey Situ after 11-19-16 surgery   Environmental allergies    Esophageal dysmotility     Fibrocystic breast disease    GERD (gastroesophageal reflux disease)    Heart murmur    asymptomatic   Hyperlipidemia    unspecified   Hypertension    Mitral regurgitation    Monilial esophagitis ([place]) 01/20/2017   Obstipation    Osteopenia    Peptic ulcer disease    Pulmonary fibrosis ([place])    per Dr Raul Del   RA (rheumatoid arthritis) ([place])    Raynaud's disease    Redundant colon 01/20/2017   Scleroderma ([place])    Telangiectasia of colon    Tubular adenoma of colon 01/20/2017   unspecified   Vulvar dysplasia    Social History   Socioeconomic History   Marital status: Widowed    Spouse name: Not on file   Number of children: Not on file   Years of education: Not on file  Highest education level: Not on file  Occupational History   Occupation: retired  Tobacco Use   Smoking status: Never   Smokeless tobacco: Never  Vaping Use   Vaping Use: Never used  Substance and Sexual Activity   Alcohol use: No   Drug use: No   Sexual activity: Not on file  Other Topics Concern   Not on file  Social History Narrative   Not on file   Social Determinants of Health   Financial Resource Strain: Not on file  Food Insecurity: Not on file  Transportation Needs: Not on file  Physical Activity: Not on file  Stress: Not on file  Social Connections: Not on file   Family History  Problem Relation Age of Onset   Endometrial cancer Mother 88   Hypertension Mother    Osteoporosis Mother    Bladder Cancer Father    COPD Father    Cerebral palsy Sister    Scheduled Meds:  amoxicillin-clavulanate  1 tablet Oral Q8H   atorvastatin  40 mg Oral QHS   budesonide (PULMICORT) nebulizer solution  0.5 mg Nebulization BID   calcitonin (salmon)  1 spray Alternating Nares Daily   Chlorhexidine Gluconate Cloth  6 each Topical Daily   DULoxetine  60 mg Oral Daily   enoxaparin (LOVENOX) injection  40 mg Subcutaneous Q24H   ezetimibe  10 mg Oral QHS   feeding supplement  237 mL Oral BID  BM   gabapentin  300 mg Oral TID   hydroxychloroquine  200 mg Oral BID   iron polysaccharides  150 mg Oral Daily   isosorbide mononitrate  15 mg Oral Daily   latanoprost  1 drop Both Eyes QHS   mouth rinse  15 mL Mouth Rinse BID   methylPREDNISolone  4 mg Oral QPC supper   Followed by   methylPREDNISolone  8 mg Oral QHS   Followed by   Derrill Memo ON 01/16/2022] methylPREDNISolone  4 mg Oral 3 x daily with food   Followed by   Derrill Memo ON 01/16/2022] methylPREDNISolone  8 mg Oral QHS   Followed by   Derrill Memo ON 01/17/2022] methylPREDNISolone  4 mg Oral QID   Followed by   Derrill Memo ON 01/18/2022] methylPREDNISolone  4 mg Oral TID   Followed by   Derrill Memo ON 01/19/2022] methylPREDNISolone  4 mg Oral BID   Followed by   Derrill Memo ON 01/20/2022] methylPREDNISolone  4 mg Oral Daily   metoprolol tartrate  12.5 mg Oral BID   multivitamin with minerals  1 tablet Oral Daily   pantoprazole (PROTONIX) IV  40 mg Intravenous Q12H   Continuous Infusions:  magnesium sulfate bolus IVPB 2 g (01/15/22 1332)   PRN Meds:.acetaminophen, ALPRAZolam, ipratropium-albuterol, lip balm, melatonin, nitroGLYCERIN, ondansetron **OR** ondansetron (ZOFRAN) IV, polyethylene glycol Medications Prior to Admission:  Prior to Admission medications   Medication Sig Start Date End Date Taking? Authorizing Provider  amLODipine (NORVASC) 5 MG tablet Take 1 tablet (5 mg total) by mouth daily. 09/23/21  Yes Minna Merritts, MD  aspirin 81 MG tablet Take 81 mg by mouth daily.    Yes [provider]  atorvastatin (LIPITOR) 40 MG tablet Take 1 tablet (40 mg total) by mouth daily. Patient taking differently: Take 40 mg by mouth at bedtime. 09/23/21  Yes Gollan, Kathlene November, MD  calcitonin, salmon, (MIACALCIN/FORTICAL) 200 UNIT/ACT nasal spray Place 1 spray into alternate nostrils daily. 12/20/21  Yes Danford, Suann Larry, MD  cephALEXin (KEFLEX) 500 MG capsule Take 500 mg by mouth  2 (two) times daily.   Yes [provider]   clopidogrel (PLAVIX) 75 MG tablet Take 1 tablet (75 mg total) by mouth daily. 09/23/21  Yes Gollan, Kathlene November, MD  DULoxetine (CYMBALTA) 60 MG capsule Take 60 mg by mouth daily. In the morning. 08/05/18  Yes [provider]  ezetimibe (ZETIA) 10 MG tablet Take 1 tablet (10 mg total) by mouth daily. Patient taking differently: Take 10 mg by mouth at bedtime. 09/23/21  Yes Gollan, Kathlene November, MD  furosemide (LASIX) 20 MG tablet Take 1 tablet (20 mg total) by mouth every other day. May take extra 20 mg for swelling or shortness of breath Patient taking differently: Take 20 mg by mouth every other day. 09/23/21  Yes Gollan, Kathlene November, MD  gabapentin (NEURONTIN) 600 MG tablet Take 1 tablet by mouth 3 (three) times daily. 04/05/21  Yes [provider]  hydroxychloroquine (PLAQUENIL) 200 MG tablet Take 1 tablet (200 mg total) by mouth 2 (two) times daily. 09/23/21  Yes Gollan, Kathlene November, MD  iron polysaccharides (NIFEREX) 150 MG capsule Take 1 capsule (150 mg total) by mouth daily. 12/20/21  Yes Danford, Suann Larry, MD  isosorbide mononitrate (IMDUR) 30 MG 24 hr tablet Take 0.5 tablets (15 mg total) by mouth daily. 09/23/21  Yes Gollan, Kathlene November, MD  latanoprost (XALATAN) 0.005 % ophthalmic solution Place 1 drop into both eyes at bedtime.    Yes [provider]  metoprolol tartrate (LOPRESSOR) 25 MG tablet Take 0.5 tablets (12.5 mg total) by mouth 2 (two) times daily. 09/23/21  Yes Gollan, Kathlene November, MD  Multiple Vitamin (MULTIVITAMIN WITH MINERALS) TABS tablet Take 1 tablet by mouth daily.   Yes [provider]  acetaminophen (TYLENOL) 325 MG tablet Take 2 tablets (650 mg total) by mouth every 6 (six) hours as needed for mild pain or fever. 12/19/21   Danford, Suann Larry, MD  albuterol (VENTOLIN HFA) 108 (90 Base) MCG/ACT inhaler Inhale 2 puffs into the lungs every 4 (four) hours as needed for wheezing or shortness of breath. 06/02/19   Bonnielee Haff, MD  ALPRAZolam  Duanne Moron) 0.5 MG tablet Take 1 tablet (0.5 mg total) by mouth 4 (four) times daily as needed for anxiety. Patient taking differently: Take 0.5 mg by mouth every 12 (twelve) hours as needed for anxiety. 12/19/21   Danford, Suann Larry, MD  methocarbamol (ROBAXIN) 500 MG tablet Take 1 tablet (500 mg total) by mouth every 8 (eight) hours as needed for muscle spasms. Patient not taking: Reported on 01/12/2022 12/19/21   Edwin Dada, MD  Nintedanib (OFEV) 150 MG CAPS Take 1 capsule (150 mg total) by mouth daily. Patient not taking: Reported on 01/12/2022 05/22/20   Martyn Ehrich, NP  nitroGLYCERIN (NITROSTAT) 0.4 MG SL tablet DISSOLVE 1 TABLET UNDER THE TONGUE EVERY5 MINUTES AS NEEDED FOR CHEST PAIN 04/15/21   Minna Merritts, MD  oxyCODONE-acetaminophen (PERCOCET/ROXICET) 5-325 MG tablet Take 1 tablet by mouth every 4 (four) hours as needed for moderate pain. Patient not taking: Reported on 01/12/2022 12/19/21   Edwin Dada, MD  polyethylene glycol (MIRALAX / GLYCOLAX) packet Take 17 g by mouth daily as needed (constipation.).     [provider]  zolpidem (AMBIEN) 10 MG tablet Take 1 tablet (10 mg total) by mouth at bedtime as needed for sleep. Patient not taking: Reported on 01/12/2022 12/19/21   Edwin Dada, MD   Allergies  Allergen Reactions   Remicade [Infliximab] Shortness Of Breath and Itching  Sulfa Antibiotics Other (See Comments)    Other Reaction: tounge cracked   Actonel [Risedronate Sodium] Other (See Comments)   Fosamax [Alendronate Sodium]     GI Bleed    Procardia [Nifedipine] Hives   Elemental Sulfur Swelling   Wellbutrin [Bupropion] Anxiety   Review of Systems  Unable to perform ROS: Other   Physical Exam Vitals and nursing note reviewed.  Constitutional:      General: She is not in acute distress.    Appearance: She is ill-appearing.  HENT:     Mouth/Throat:     Mouth: Mucous membranes are moist.  Cardiovascular:     Rate and  Rhythm: Normal rate.  Pulmonary:     Effort: Pulmonary effort is normal. No respiratory distress.  Skin:    General: Skin is warm and dry.  Neurological:     Mental Status: She is alert and oriented to person, place, and time.  Psychiatric:        Mood and Affect: Mood normal.        Behavior: Behavior normal.    Vital Signs: BP (!) 106/59 (BP Location: Right Arm)    Pulse 72    Temp 98.3 F (36.8 C) (Oral)    Resp 16    Ht 5' (1.524 m)    Wt 63.5 kg    LMP  (LMP Unknown)    SpO2 95%    BMI 27.34 kg/m  Pain Scale: 0-10   Pain Score: 0-No pain   SpO2: SpO2: 95 % O2 Device:SpO2: 95 % O2 Flow Rate: .O2 Flow Rate (L/min): 1 L/min  IO: Intake/output summary:  Intake/Output Summary (Last 24 hours) at 01/15/2022 1338 Last data filed at 01/15/2022 1014 Gross per 24 hour  Intake 860 ml  Output 1750 ml  Net -890 ml    LBM: Last BM Date: 01/14/22 Baseline Weight: Weight: 63.5 kg Most recent weight: Weight: 63.5 kg     Palliative Assessment/Data:   Flowsheet Rows    Flowsheet Row Most Recent Value  Intake Tab   Referral Department Hospitalist  Unit at Time of Referral Med/Surg Unit  Palliative Care Primary Diagnosis Pulmonary  Date Notified 01/14/22  Palliative Care Type New Palliative care  Reason for referral Clarify Goals of Care  Date of Admission 01/12/22  Date first [name] by Palliative Care 01/15/22  # of days Palliative referral response time 1 Day(s)  # of days IP prior to Palliative referral 2  Clinical Assessment   Palliative Performance Scale Score 50%  Pain Max last 24 hours Not able to report  Pain Min Last 24 hours Not able to report  Dyspnea Max Last 24 Hours Not able to report  Dyspnea Min Last 24 hours Not able to report  Psychosocial & Spiritual Assessment   Palliative Care Outcomes        Time In: 1040 Time Out: 1155 Time Total: 75 minutes  Greater than 50%  of this time was spent counseling and coordinating care related to the above assessment  and plan.  Signed by: Drue Novel, NP   Please contact Palliative Medicine Team phone at [phone] for questions and concerns.  For individual provider: See Shea Evans

## 2022-01-15 NOTE — Progress Notes (Signed)
Speech Language Pathology Treatment: Dysphagia  Patient Details Name: Erin [name] MRN: [id number] DOB: December 03, 1946 Today's Date: 01/15/2022 Time: 1194-1740 SLP Time Calculation (min) (ACUTE ONLY): 45 min  Assessment / Plan / Recommendation Clinical Impression  Pt seen for ongoing assessment of swallowing; trials to upgrade diet consistency today if able. Pt's Dtr in law was present briefly at the beginning of session and endorsed pt would like to "have some vegetable soup and crackers"; pt stated she could not eat the pureed foods. Pt and Staff stated she is drinking thin liquids w/out difficulty; WBC trending down at 10.7, afebrile. CXR revealed "Chronic baseline interstitial scarring with superimposed  interstitial thickening which may represent pulmonary edema versus  pneumonia in the appropriate clinical setting.".   Pt has medical history significant for years of Esophageal Dysmotility from Raynaud's Disease and Scleroderma w/ ongoing s/s of Esophageal dysphagia and s/s of Regurgitation of material -- such GI Dysmotility issues can increase risk for Regurgitation of REFLUX material and aspiration of such thus impacting her Pulmonary status including scarring and pneumonia. Pt is on a chopped foods diet at Baseline at her NH per her report. Pt has been seen by ST services in 2019 w/ similar presentation.    Pt appears to present w/ adequate oropharyngeal phase swallow function w/ no overt, gross s/s oral or pharyngeal phase deficits; No immediate, overt s/s of aspiration noted during po trials at this session. Pt consumed thin liquids VIA CUP, purees, and Minced/Broken down foods moistened well w/ no overt coughing or wet vocal quality noted b/t trials. Pt's baseline respiratory status/effort remained relatively calm w/ No increased WOB/SOB -- rest breaks b/t trials given. Per chart, pt has chronic interstitial fibrosis in the setting of rheumatoid arthritis(RA) and Scleroderma, Raynaud's Disease.   Oral phase was min slower w/ mastication and bolus clearing but adequate given Time. Pt's dentures were in place -- noted slight forward position of the Upper denture plate causing pt to not fully close lips over the denture plate w/ saliva collecting anteriorly. Pt fed self w/ setup and positioning support.   Recommend a MINCED foods diet, moistened foods; Thin liquids VIA CUP for best control. Recommend general aspiration precautions; pills Crushed in Puree for easier Esophageal clearing/swallowing. Strict REFLUX precautions d/t Esophageal Dysmotility Baseline. Education given on the need to follow strict REFLUX precautions for the Esophgeal dysmotility she experiences frequently secondary to the Belching and min Regurgitation of food/liquid material(d/t the Scleroderma, Raynaud's dis.). Pt could f/u w/ GI for further assessment and Education on such GI issues. Pt would benefit from continued f/u w/ Palliative Care for Erin [name] and support, and Dietician f/u for support. No further skilled ST services indicated; handouts given. Pt requested a built-up handle for utensils to better grip to feed self -- OT contacted for such.  MD and Palliative Care Team updated.       HPI HPI: Per 42 H&P ": Erin [name] is a 76 y.o. female with medical history significant for years of Esophageal Dysmotility from Raynaud's Disease and Scleroderma w/ ongoing s/s of significant Esophageal dysphagia and s/s of Regurgitation of material(such GI issues can increase risk for Regurgitation of REFLUX material and aspiration of such thus impacting her Pulmonary status including scarring and pneumonia."); pt is on a chopped foods diet at Baseline at [facility], hypertension, coronary artery disease, GERD who was sent from the assisted living facility where she currently resides to the ER for evaluation of fever and hypoxia.  Patient was noted to  have a Tmax of 100.1 at the skilled nursing facility and room air pulse oximetry was in  the 80s.  She was placed on 6 L of oxygen with improvement in her pulse oximetry to 93%.  She is oriented to person and place but at baseline is usually awake, alert and oriented x3.  Patient's son states that she was recently hospitalized following a fall and at that time was found to have an L1 compression fracture status post kyphoplasty by interventional radiology.  Her hospital course was complicated by urinary retention and she was discharged to subacute rehab with a Foley catheter.  Foley catheter was removed on 01/09/22 by urology but had to be replaced same day because of voiding difficulty.  Patient has been on Keflex prescribed by urology for suspected UTI.  She received broad-spectrum antibiotics, Flagyl, vancomycin and cefepime in the ER for sepsis from multifocal pneumonia."       CXR 01/15/22: Chronic baseline interstitial scarring with superimposed  interstitial thickening which may represent pulmonary edema versus  pneumonia in the appropriate clinical setting.      SLP Plan  All goals met (pt appears at her baseline)      Recommendations for follow up therapy are one component of a multi-disciplinary discharge planning process, led by the attending physician.  Recommendations may be updated based on patient status, additional functional criteria and insurance authorization.    Recommendations  Diet recommendations: Dysphagia 2 (fine chop);Thin liquid Liquids provided via: Cup;No straw Medication Administration: Crushed with puree (as able -- for ease of Esophageal motility) Supervision: Patient able to self feed (setup support) Compensations: Minimize environmental distractions;Slow rate;Small sips/bites;Lingual sweep for clearance of pocketing;Follow solids with liquid;Multiple dry swallows after each bite/sip Postural Changes and/or Swallow Maneuvers: Out of bed for meals;Seated upright 90 degrees;Upright 30-60 min after meal (REFLUX precautions -- rest breaks during meals to  allow for motility and clearing)                General recommendations:  (Palliative Care f/u for GOC/education on chronic conditions; Dietician f/u for support) Oral Care Recommendations: Oral care BID;Oral care before and after PO;Staff/trained caregiver to provide oral care (Denture care) Follow Up Recommendations: No SLP follow up Assistance recommended at discharge: Set up Supervision/Assistance SLP Visit Diagnosis: Dysphagia, pharyngoesophageal phase (R13.14) (Esophageal Dysphagia - PRIMARY) Plan: All goals met (pt appears at her baseline)            Orinda Kenner, [place], [place]; [place] [phone] (ascom) Devra Stare  01/15/2022, 2:24 PM

## 2022-01-15 NOTE — Plan of Care (Signed)
  Problem: Education: Goal: Knowledge of General Education information will improve Description Including pain rating scale, medication(s)/side effects and non-pharmacologic comfort measures Outcome: Progressing   

## 2022-01-15 NOTE — Progress Notes (Signed)
PROGRESS NOTE    Erin [name]  ION:[id number] DOB: 1946/02/06 DOA: 01/12/2022 PCP: Idelle Crouch, MD   Brief Narrative: Taken from prior notes. Erin [name] is a 76 y.o. female with medical history significant for hypertension, coronary artery disease, rheumatoid arthritis, scleroderma, interstitial lung disease, hypertension, urinary retention and GERD who was sent from the assisted living facility where she currently resides to the ER for evaluation of fever and hypoxia.  Admitted for sepsis secondary to pneumonia, initially received cefepime, Flagyl and vancomycin, later cefepime was continued and not transition to Augmentin. Concern of acute exacerbation of scleroderma and interstitial lung disease by pulmonology, received steroid, now started on Medrol pack.  Also found to have hemoglobin of 6.9 with baseline around 9.  No obvious bleeding.  Received 1 unit of PRBC with improvement in hemoglobin to 8.8.  Also had thrombocytosis, most likely reactive. She will continue home dose of aspirin and Plavix.  Patient had recent hospitalization after having a fall and found to have L1 compression fracture s/p kyphoplasty by IR.  Her hospital course was complicated by urinary retention and she was discharged to subacute rehab with a Foley catheter which was removed by urology on 01/09/2021 followed by reinsertion on the same day because of voiding difficulty.  She received a course of Keflex by urology for a suspected UTI earlier this month.  Palliative care was also consulted due to life limiting comorbidities, PT is recommending SNF.  Patient and family has to decide between going to rehab versus home with home hospice. Family meeting is set up for tomorrow.  Subjective: Patient was seen and examined today.  Sitting in chair comfortably.  No new complaints.  She thinks that she is improving.  She wants to go to short-term rehab to regain her strength back.  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  Received appropriate antibiotics, now improved.  Cefepime has been switched with Augmentin by pulmonology. -Continue Augmentin for 3 more days  Acute hypoxic respiratory failure.  Concern of acute exacerbation of her ILD and scleroderma.  Received IV steroid and now started on Medrol pack by pulmonology.  Currently saturating well on 1 L of oxygen.  No baseline oxygen requirement. -Try weaning from oxygen. -Continue with Medrol pack -Continue Pulmicort and DuoNeb -Outpatient pulmonology follow-up  Rheumatoid arthritis. -Continue home Plaquenil  Hypertension.  Blood pressure within lower normal limit. -Continue holding home amlodipine  Anemia.  Found to have hemoglobin of 6.9 with baseline around 9.  No obvious bleeding.  Patient was on aspirin and Plavix after having a PCI in 2021.  Received 1 unit of PRBC with improvement of hemoglobin which remained stable.. -Continue with aspirin and Plavix -Monitor hemoglobin -Can consult GI if needed  [name]/HFpEF.  S/p PCI in 2021.  No chest pain.  Troponin mildly elevated with a flat curve, most likely secondary to demand ischemia. -Continue home Imdur and metoprolol -Continue with aspirin and Plavix -Continue home dose of statin and Zetia -Holding home diuretics  Chronic pain. -Continue home dose of duloxetine. -Home dose of gabapentin was reduced to 300 mg 3 times daily.  Hypomagnesemia.  Magnesium at 1.6 -Replace  magnesium and monitor  Urinary retention.  Currently with Foley catheter, failed voiding trial recently. -Continue with Foley catheter -Outpatient urology  follow-up  Objective: Vitals:   01/15/22 0802 01/15/22 1128 01/15/22 1514 01/15/22 1703  BP:  (!) 106/59 109/64 100/66  Pulse:  72 82 79  Resp:  16 16 19   Temp:  98.3 F (36.8 C) 99.1 F (37.3 C) 98.4 F (36.9 C)  TempSrc:  Oral Oral Oral  SpO2: 97% 95% 98% 95%  Weight:      Height:        Intake/Output Summary (Last 24 hours) at 01/15/2022 1813 Last data filed at 01/15/2022 1600 Gross per 24 hour  Intake 330 ml  Output 800 ml  Net -470 ml   Filed Weights   01/12/22 1132  Weight: 63.5 kg    Examination:  General exam: Appears calm and comfortable  Respiratory system: Few bilateral scattered dry crackles. Respiratory effort normal. Cardiovascular system: S1 & S2 heard, RRR.  Gastrointestinal system: Soft, nontender, nondistended, bowel sounds positive. Central nervous system: Alert and oriented. No focal neurological deficits. Extremities: No edema, no cyanosis, pulses intact and symmetrical. Psychiatry: Judgement and insight appear normal.   DVT prophylaxis: Lovenox Code Status: DNR Family Communication:  Disposition Plan:  Status is: Inpatient Remains inpatient appropriate because: Severity of illness   Level of care: Med-Surg  All the records are reviewed and case discussed with Care Management/Social Worker. Management plans discussed with the patient, nursing and they are in agreement.  Consultants:  Pulmonology  Procedures:  Antimicrobials:  Augmentin  Data Reviewed: I have personally reviewed following labs and imaging studies  CBC: Recent Labs  Lab 01/12/22 1151 01/13/22 0513 01/14/22 0508 01/15/22 0619  WBC 15.5* 13.5* 13.0* 10.7*  NEUTROABS 12.5*  --   --   --   HGB 7.7* 6.9* 8.8* 8.6*  HCT 24.6* 22.3* 27.2* 26.7*  MCV 83.1 82.0 84.2 83.7  PLT 731* 658* 649* 326*   Basic Metabolic Panel: Recent Labs  Lab 01/12/22 1151 01/13/22 0513 01/14/22 0508 01/15/22 0619  NA 129* 131* 132* 134*  K 4.1 3.7 3.4* 3.7  CL 99 102 100 100  CO2  23 21* 23 27  GLUCOSE 88 56* 66* 87  BUN 14 11 8 9   CREATININE 1.05* 0.75 0.50 0.46  CALCIUM 8.0* 7.9* 7.9* 8.0*  MG  --   --  1.6* 1.5*   GFR: Estimated Creatinine Clearance: 49.8 mL/min (by C-G formula based on SCr of 0.46 mg/dL). Liver Function Tests: Recent Labs  Lab 01/12/22 1151  AST 34  ALT 21  ALKPHOS 88  BILITOT 0.5  PROT 5.6*  ALBUMIN 2.4*   No results for input(s): LIPASE, AMYLASE in the last 168 hours. No results for input(s): AMMONIA in the last 168 hours. Coagulation Profile: Recent Labs  Lab 01/12/22 1151 01/13/22 0513  INR 1.2 1.3*   Cardiac Enzymes: No results for input(s): CKTOTAL, CKMB, CKMBINDEX, TROPONINI in the last 168 hours. BNP (last 3 results) No results for input(s): PROBNP in the last 8760 hours. HbA1C: No results for input(s): HGBA1C in the last 72 hours. CBG: No results for input(s): GLUCAP in the last 168 hours. Lipid Profile: No results for input(s): CHOL, HDL, LDLCALC, TRIG, CHOLHDL, LDLDIRECT in the last 72 hours. Thyroid Function Tests: No results for input(s): TSH, T4TOTAL, FREET4, T3FREE, THYROIDAB in the last 72 hours. Anemia Panel: No results for input(s): VITAMINB12, FOLATE, FERRITIN, TIBC, IRON, RETICCTPCT in the last 72 hours. Sepsis Labs: Recent Labs  Lab 01/12/22 1151 01/12/22 1456 01/13/22 0513 01/14/22 0508  PROCALCITON 0.11  --  0.15 0.12  LATICACIDVEN 1.3 0.9  --   --     Recent Results (from the past 240 hour(s))  Resp Panel by RT-PCR (Flu A&B, Covid) Nasopharyngeal Swab     Status: None   Collection Time: 01/12/22 11:51 AM   Specimen: Nasopharyngeal Swab; Nasopharyngeal(NP) swabs in vial transport medium  Result Value Ref Range Status   SARS Coronavirus 2 by RT PCR NEGATIVE NEGATIVE Final    Comment: (NOTE) SARS-CoV-2 target nucleic acids are NOT DETECTED.  The SARS-CoV-2 RNA is generally detectable in upper respiratory specimens during the acute phase of infection. The lowest concentration of  SARS-CoV-2 viral copies this assay can detect is 138 copies/mL. A negative result does not preclude SARS-Cov-2 infection and should not be used as the sole basis for treatment or other patient management decisions. A negative result may occur with  improper specimen collection/handling, submission of specimen other than nasopharyngeal swab, presence of viral mutation(s) within the areas targeted by this assay, and inadequate number of viral copies(<138 copies/mL). A negative result must be combined with clinical observations, patient history, and epidemiological information. The expected result is Negative.  Fact Sheet for Patients:  EntrepreneurPulse.com.au  Fact Sheet for Healthcare Providers:  IncredibleEmployment.be  This test is no t yet approved or cleared by the [place] FDA and  has been authorized for detection and/or diagnosis of SARS-CoV-2 by FDA under an Emergency Use Authorization (EUA). This EUA will remain  in effect (meaning this test can be used) for the duration of the COVID-19 declaration under Section 564(b)(1) of the Act, 21 U.S.C.section 360bbb-3(b)(1), unless the authorization is terminated  or revoked sooner.       Influenza A by PCR NEGATIVE NEGATIVE Final   Influenza B by PCR NEGATIVE NEGATIVE Final    Comment: (NOTE) The Xpert Xpress SARS-CoV-2/FLU/RSV plus assay is intended as an aid in the diagnosis of influenza from Nasopharyngeal swab specimens and should not be used as a sole basis for treatment. Nasal washings and aspirates are unacceptable for Xpert Xpress SARS-CoV-2/FLU/RSV testing.  Fact Sheet for Patients: EntrepreneurPulse.com.au  Fact Sheet for Healthcare Providers: IncredibleEmployment.be  This test is not yet approved or cleared by the [place] FDA and has been authorized for detection and/or diagnosis of SARS-CoV-2 by FDA under an Emergency Use  Authorization (EUA). This EUA will remain in effect (meaning this test can be used) for the duration of the COVID-19 declaration under Section 564(b)(1) of the Act, 21 U.S.C. section 360bbb-3(b)(1), unless the authorization is terminated or revoked.  Performed at [facility], [address] [place], [place] [address]   Blood Culture (routine x 2)     Status: None (Preliminary result)   Collection Time: 01/12/22 11:51 AM   Specimen: BLOOD  Result Value Ref Range Status   Specimen Description BLOOD BLOOD LEFT WRIST  Final   Special Requests   Final    BOTTLES DRAWN AEROBIC AND ANAEROBIC Blood Culture adequate volume   Culture   Final    NO GROWTH 3 DAYS Performed at [facility], [address] [place], [place] [address]    Report Status PENDING  Incomplete  Urine Culture     Status: None   Collection Time: 01/12/22 11:51 AM   Specimen: Urine, Random  Result Value Ref Range Status   Specimen Description   Final    URINE, RANDOM Performed at [facility], [address] [place], [place] [address]    Special Requests   Final    NONE Performed at [facility], [address] [place], [place] [address]    Culture   Final    NO GROWTH Performed at [facility], [place] [address], [place], [place] [address]    Report Status 01/13/2022 FINAL  Final  Respiratory (~20 pathogens) panel by PCR     Status: None   Collection Time: 01/12/22 11:51 AM   Specimen: Nasopharyngeal Swab; Respiratory  Result Value Ref Range Status   Adenovirus NOT DETECTED NOT DETECTED Final   Coronavirus 229E NOT DETECTED NOT DETECTED Final    Comment: (NOTE) The Coronavirus on the Respiratory Panel, DOES NOT test for the novel  Coronavirus (2019 nCoV)    Coronavirus HKU1 NOT DETECTED NOT DETECTED Final   Coronavirus NL63 NOT DETECTED NOT DETECTED Final   Coronavirus OC43 NOT DETECTED NOT DETECTED Final   Metapneumovirus NOT DETECTED NOT DETECTED Final    Rhinovirus / Enterovirus NOT DETECTED NOT DETECTED Final   Influenza A NOT DETECTED NOT DETECTED Final   Influenza B NOT DETECTED NOT DETECTED Final   Parainfluenza Virus 1 NOT DETECTED NOT DETECTED Final   Parainfluenza Virus 2 NOT DETECTED NOT DETECTED Final   Parainfluenza Virus 3 NOT DETECTED NOT DETECTED Final   Parainfluenza Virus 4 NOT DETECTED NOT DETECTED Final   Respiratory Syncytial Virus NOT DETECTED NOT DETECTED Final   Bordetella pertussis NOT DETECTED NOT DETECTED Final   Bordetella Parapertussis NOT DETECTED NOT DETECTED Final   Chlamydophila pneumoniae NOT DETECTED NOT DETECTED Final   Mycoplasma pneumoniae NOT DETECTED NOT DETECTED Final    Comment: Performed at [facility], [place], [place], [place] [address]  Culture, blood (Routine X 2) w Reflex to ID Panel     Status: None (Preliminary result)   Collection Time: 01/13/22  5:13 AM   Specimen: BLOOD  Result Value Ref Range Status   Specimen Description BLOOD RIGHT [facility]  Final   Special Requests   Final    BOTTLES DRAWN AEROBIC AND ANAEROBIC Blood Culture adequate volume   Culture   Final    NO GROWTH 2 DAYS Performed at [facility], [address], [place], [place] [address]    Report Status PENDING  Incomplete  MRSA Next Gen by PCR, Nasal     Status: None   Collection Time: 01/13/22  9:41 AM   Specimen: Nasal Mucosa; Nasal Swab  Result Value Ref Range Status   MRSA by PCR Next Gen NOT DETECTED NOT DETECTED Final    Comment: (NOTE) The GeneXpert MRSA Assay (FDA approved for NASAL specimens only), is one component of a comprehensive MRSA colonization surveillance program. It is not intended to diagnose MRSA infection nor to guide or monitor treatment for MRSA infections. Test performance is not FDA approved in patients less than 8 years old. Performed at [facility], [address] [place], [place] [address]      Radiology Studies: DG Shoulder Right  Result Date:  01/14/2022 CLINICAL DATA:  Recent fall EXAM: RIGHT SHOULDER - 2+ VIEW COMPARISON:  Chest x-ray 01/12/2022, 05/16/2021 FINDINGS: No fracture or malalignment. Mild AC joint degenerative change. Multiple age indeterminate right-sided rib fractures. IMPRESSION: 1. No acute osseous abnormality at the shoulder. 2. Multiple age indeterminate right-sided rib fractures Electronically Signed   By: Donavan Foil M.D.   On: 01/14/2022 19:24   DG Chest [facility] 1 View  Result  Date: 01/15/2022 CLINICAL DATA:  Atelectasis EXAM: PORTABLE CHEST 1 VIEW COMPARISON:  AP chest 01/12/2022, 05/27/2019 FINDINGS: Cardiac silhouette and mediastinal contours are unchanged with mild enlargement of the cardiac silhouette and tortuous thoracic aorta again seen. Moderate bilateral interstitial thickening appears mildly worsened from most recent prior 01/12/2022 radiograph and more remote 05/27/2019 radiograph. Left basilar linear scarring appears chronic also on 05/27/2019 radiograph. It is difficult to exclude small pleural effusions. No pneumothorax is seen. Moderate multilevel degenerative disc changes of the thoracic spine with multilevel midthoracic and upper lumbar kyphoplasty cement. IMPRESSION: Chronic baseline interstitial scarring with superimposed interstitial thickening which may represent pulmonary edema versus pneumonia in the appropriate clinical setting. Electronically Signed   By: Yvonne Kendall M.D.   On: 01/15/2022 08:45    Scheduled Meds:  amoxicillin-clavulanate  1 tablet Oral Q8H   atorvastatin  40 mg Oral QHS   budesonide (PULMICORT) nebulizer solution  0.5 mg Nebulization BID   calcitonin (salmon)  1 spray Alternating Nares Daily   Chlorhexidine Gluconate Cloth  6 each Topical Daily   DULoxetine  60 mg Oral Daily   enoxaparin (LOVENOX) injection  40 mg Subcutaneous Q24H   ezetimibe  10 mg Oral QHS   feeding supplement  237 mL Oral BID BM   gabapentin  300 mg Oral TID   hydroxychloroquine  200 mg Oral BID   iron  polysaccharides  150 mg Oral Daily   isosorbide mononitrate  15 mg Oral Daily   latanoprost  1 drop Both Eyes QHS   mouth rinse  15 mL Mouth Rinse BID   methylPREDNISolone  4 mg Oral QPC supper   Followed by   methylPREDNISolone  8 mg Oral QHS   Followed by   Derrill Memo ON 01/16/2022] methylPREDNISolone  4 mg Oral 3 x daily with food   Followed by   Derrill Memo ON 01/16/2022] methylPREDNISolone  8 mg Oral QHS   Followed by   Derrill Memo ON 01/17/2022] methylPREDNISolone  4 mg Oral QID   Followed by   Derrill Memo ON 01/18/2022] methylPREDNISolone  4 mg Oral TID   Followed by   Derrill Memo ON 01/19/2022] methylPREDNISolone  4 mg Oral BID   Followed by   Derrill Memo ON 01/20/2022] methylPREDNISolone  4 mg Oral Daily   metoprolol tartrate  12.5 mg Oral BID   multivitamin with minerals  1 tablet Oral Daily   pantoprazole (PROTONIX) IV  40 mg Intravenous Q12H   Continuous Infusions:   LOS: 3 days   Time spent: 45 minutes. More than 50% of the time was spent in counseling/coordination of care  Lorella Nimrod, MD [facility]  If 7PM-7AM, please contact night-coverage [email or web address]  01/15/2022, 6:13 PM   This record has been created using Dragon voice recognition software. Errors have been sought and corrected,but may not always be located. Such creation errors do not reflect on the standard of care.

## 2022-01-16 DIAGNOSIS — J84112 Idiopathic pulmonary fibrosis: Secondary | ICD-10-CM | POA: Diagnosis not present

## 2022-01-16 DIAGNOSIS — A419 Sepsis, unspecified organism: Secondary | ICD-10-CM | POA: Diagnosis not present

## 2022-01-16 DIAGNOSIS — Z515 Encounter for palliative care: Secondary | ICD-10-CM | POA: Diagnosis not present

## 2022-01-16 DIAGNOSIS — Z7189 Other specified counseling: Secondary | ICD-10-CM | POA: Diagnosis not present

## 2022-01-16 DIAGNOSIS — I5032 Chronic diastolic (congestive) heart failure: Secondary | ICD-10-CM | POA: Diagnosis not present

## 2022-01-16 DIAGNOSIS — R6520 Severe sepsis without septic shock: Secondary | ICD-10-CM | POA: Diagnosis not present

## 2022-01-16 DIAGNOSIS — J9601 Acute respiratory failure with hypoxia: Secondary | ICD-10-CM | POA: Diagnosis not present

## 2022-01-16 LAB — CBC
HCT: 28.3 % — ABNORMAL LOW (ref 36.0–46.0)
Hemoglobin: 8.9 g/dL — ABNORMAL LOW (ref 12.0–15.0)
MCH: 26.3 pg (ref 26.0–34.0)
MCHC: 31.4 g/dL (ref 30.0–36.0)
MCV: 83.7 fL (ref 80.0–100.0)
Platelets: 669 10*3/uL — ABNORMAL HIGH (ref 150–400)
RBC: 3.38 MIL/uL — ABNORMAL LOW (ref 3.87–5.11)
RDW: 16.7 % — ABNORMAL HIGH (ref 11.5–15.5)
WBC: 8.6 10*3/uL (ref 4.0–10.5)
nRBC: 0 % (ref 0.0–0.2)

## 2022-01-16 LAB — BASIC METABOLIC PANEL
Anion gap: 6 (ref 5–15)
BUN: 10 mg/dL (ref 8–23)
CO2: 28 mmol/L (ref 22–32)
Calcium: 8 mg/dL — ABNORMAL LOW (ref 8.9–10.3)
Chloride: 98 mmol/L (ref 98–111)
Creatinine, Ser: 0.55 mg/dL (ref 0.44–1.00)
GFR, Estimated: >60 mL/min (ref >60)
Glucose, Bld: 106 mg/dL — ABNORMAL HIGH (ref 70–99)
Potassium: 4 mmol/L (ref 3.5–5.1)
Sodium: 132 mmol/L — ABNORMAL LOW (ref 135–145)

## 2022-01-16 LAB — C-REACTIVE PROTEIN: CRP: 14.2 mg/dL — ABNORMAL HIGH (ref <1.0)

## 2022-01-16 LAB — MAGNESIUM: Magnesium: 2 mg/dL (ref 1.7–2.4)

## 2022-01-16 MED ORDER — AMOXICILLIN-POT CLAVULANATE 500-125 MG PO TABS
1.0000 | ORAL_TABLET | Freq: Two times a day (BID) | ORAL | Status: DC
  Administered 2022-01-16 – 2022-01-17 (×2): 500 mg via ORAL
  Filled 2022-01-16 (×3): qty 1

## 2022-01-16 NOTE — TOC Progression Note (Signed)
Transition of Care [facility]) - Progression Note    Patient Details  Name: AVANA KREISER MRN: [id number] Date of Birth: [birth year]  Transition of Care [facility]) CM/SW [place], RN Phone Number: 01/16/2022, 2:53 PM  Clinical Narrative:   Spoke to patient in regards to disposition plan.  Patient asked me to call friend [name].  As per friend [name], patient will return to [facility] with [facility].  Spoke to [place] at [place], she was here to assess patient and she will follow up with [name] prior to discharge to let him know results of assessment.  [facility] notified as per consent from [place].  Will assess.  TOC contact information provided to all, TOC to follow.    Expected Discharge Plan: Skilled Nursing Facility Barriers to Discharge: Continued Medical Work up  Expected Discharge Plan and Services Expected Discharge Plan: [place]                                               Social Determinants of Health (SDOH) Interventions    Readmission Risk Interventions Readmission Risk Prevention Plan 05/29/2019  Transportation Screening Not Complete  Transportation Screening Comment Pt was independent and driving prior to getting sick, when she become sick her cousin provided transportation however cousin is now sick - will need to address closer to [place] or Home Care Consult Patient refused  Medication Review (RN Care Manager) Complete  Some recent data might be hidden

## 2022-01-16 NOTE — Progress Notes (Signed)
PROGRESS NOTE    Erin [name]  TXM:[id number] DOB: March 15, 1946 DOA: 01/12/2022 PCP: Idelle Crouch, MD   Brief Narrative: Taken from prior notes. Erin [name] is a 76 y.o. female with medical history significant for hypertension, coronary artery disease, rheumatoid arthritis, scleroderma, interstitial lung disease, hypertension, urinary retention and GERD who was sent from the assisted living facility where she currently resides to the ER for evaluation of fever and hypoxia.  Admitted for sepsis secondary to pneumonia, initially received cefepime, Flagyl and vancomycin, later cefepime was continued and not transition to Augmentin. Concern of acute exacerbation of scleroderma and interstitial lung disease by pulmonology, received steroid, now started on Medrol pack.  Also found to have hemoglobin of 6.9 with baseline around 9.  No obvious bleeding.  Received 1 unit of PRBC with improvement in hemoglobin to 8.8.  Also had thrombocytosis, most likely reactive. She will continue home dose of aspirin and Plavix.  Patient had recent hospitalization after having a fall and found to have L1 compression fracture s/p kyphoplasty by IR.  Her hospital course was complicated by urinary retention and she was discharged to subacute rehab with a Foley catheter which was removed by urology on 01/09/2021 followed by reinsertion on the same day because of voiding difficulty.  She received a course of Keflex by urology for a suspected UTI earlier this month.  Palliative care was also consulted due to life limiting comorbidities, PT is recommending SNF.  Patient and family decided to go back to [facility] with home hospice services.  [facility] was consulted and most likely can go tomorrow  Subjective: Patient was sitting comfortably in chair when seen today.  No new complaints.  She wants to go to [facility] with home hospice services.  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  Received appropriate antibiotics, now improved.  Cefepime has been switched with Augmentin by pulmonology. -Continue Augmentin for 2 more days  Acute hypoxic respiratory failure.  Concern of acute exacerbation of her ILD and scleroderma.  Received IV steroid and now started on Medrol pack by pulmonology.  Currently saturating well on 2 L of oxygen.  No baseline oxygen requirement. -Try weaning from oxygen. -Continue with Medrol pack -Continue Pulmicort and DuoNeb -Outpatient pulmonology follow-up  Rheumatoid arthritis. -Continue home Plaquenil  Hypertension.  Blood pressure within lower normal limit. -Continue holding home amlodipine  Anemia.  Found to have hemoglobin of 6.9 with baseline around 9.  No obvious bleeding.  Patient was on aspirin and Plavix after having a PCI in 2021.  Received 1 unit of PRBC with improvement of hemoglobin which remained stable.. -Continue with aspirin and Plavix -Monitor hemoglobin -Can consult GI if needed  [name]/HFpEF.  S/p PCI in 2021.  No chest pain.  Troponin mildly elevated with a flat curve, most likely secondary to demand ischemia. -Continue home Imdur and metoprolol -Continue with aspirin and Plavix -Continue home dose of statin and Zetia -Holding home diuretics  Chronic pain. -Continue home dose of duloxetine. -Home dose of gabapentin was reduced to 300 mg 3 times daily.  Hypomagnesemia.  Magnesium at 1.6 -Replace magnesium and monitor  Urinary retention.  Currently with  Foley catheter, failed voiding trial recently. -Continue with Foley catheter -Outpatient urology follow-up  Objective: Vitals:   01/16/22  0706 01/16/22 0758 01/16/22 0856 01/16/22 1107  BP:  119/78  123/60  Pulse:  65  73  Resp:    16  Temp:    98.1 F (36.7 C)  TempSrc:    Oral  SpO2: 97% 93% 97% 97%  Weight:      Height:        Intake/Output Summary (Last 24 hours) at 01/16/2022 1531 Last data filed at 01/16/2022 1300 Gross per 24 hour  Intake 370 ml  Output 750 ml  Net -380 ml    Filed Weights   01/12/22 1132  Weight: 63.5 kg    Examination:  General.  Frail elderly lady, in no acute distress. Pulmonary.  Few scattered dry crackles, normal respiratory effort. CV.  Regular rate and rhythm, no JVD, rub or murmur. Abdomen.  Soft, nontender, nondistended, BS positive. CNS.  Alert and oriented x3.  No focal neurologic deficit. Extremities.  No edema, no cyanosis, pulses intact and symmetrical. Psychiatry.  Judgment and insight appears normal.   DVT prophylaxis: Lovenox Code Status: DNR Family Communication:  Disposition Plan:  Status is: Inpatient Remains inpatient appropriate because: Severity of illness   Level of care: Med-Surg  All the records are reviewed and case discussed with Care Management/Social Worker. Management plans discussed with the patient, nursing and they are in agreement.  Consultants:  Pulmonology  Procedures:  Antimicrobials:  Augmentin  Data Reviewed: I have personally reviewed following labs and imaging studies  CBC: Recent Labs  Lab 01/12/22 1151 01/13/22 0513 01/14/22 0508 01/15/22 0619 01/16/22 0502  WBC 15.5* 13.5* 13.0* 10.7* 8.6  NEUTROABS 12.5*  --   --   --   --   HGB 7.7* 6.9* 8.8* 8.6* 8.9*  HCT 24.6* 22.3* 27.2* 26.7* 28.3*  MCV 83.1 82.0 84.2 83.7 83.7  PLT 731* 658* 649* 669* 669*    Basic Metabolic Panel: Recent Labs  Lab 01/12/22 1151 01/13/22 0513 01/14/22 0508 01/15/22 0619 01/16/22 0502  NA 129* 131* 132* 134* 132*  K 4.1 3.7 3.4* 3.7 4.0  CL 99 102 100 100 98  CO2 23 21* 23 27 28   GLUCOSE 88 56* 66* 87 106*  BUN 14 11 8 9 10    CREATININE 1.05* 0.75 0.50 0.46 0.55  CALCIUM 8.0* 7.9* 7.9* 8.0* 8.0*  MG  --   --  1.6* 1.5* 2.0    GFR: Estimated Creatinine Clearance: 49.8 mL/min (by C-G formula based on SCr of 0.55 mg/dL). Liver Function Tests: Recent Labs  Lab 01/12/22 1151  AST 34  ALT 21  ALKPHOS 88  BILITOT 0.5  PROT 5.6*  ALBUMIN 2.4*    No results for input(s): LIPASE, AMYLASE in the last 168 hours. No results for input(s): AMMONIA in the last 168 hours. Coagulation Profile: Recent Labs  Lab 01/12/22 1151 01/13/22 0513  INR 1.2 1.3*    Cardiac Enzymes: No results for input(s): CKTOTAL, CKMB, CKMBINDEX, TROPONINI in the last 168 hours. BNP (last 3 results) No results for input(s): PROBNP in the last 8760 hours. HbA1C: No results for input(s): HGBA1C in the last 72 hours. CBG: No results for input(s): GLUCAP in the last 168 hours. Lipid Profile: No results for input(s): CHOL, HDL, LDLCALC, TRIG, CHOLHDL, LDLDIRECT in the last 72 hours. Thyroid Function Tests: No results for input(s): TSH, T4TOTAL, FREET4, T3FREE, THYROIDAB in the last 72 hours. Anemia Panel: No results for  input(s): VITAMINB12, FOLATE, FERRITIN, TIBC, IRON, RETICCTPCT in the last 72 hours. Sepsis Labs: Recent Labs  Lab 01/12/22 1151 01/12/22 1456 01/13/22 0513 01/14/22 0508  PROCALCITON 0.11  --  0.15 0.12  LATICACIDVEN 1.3 0.9  --   --      Recent Results (from the past 240 hour(s))  Resp Panel by RT-PCR (Flu A&B, Covid) Nasopharyngeal Swab     Status: None   Collection Time: 01/12/22 11:51 AM   Specimen: Nasopharyngeal Swab; Nasopharyngeal(NP) swabs in vial transport medium  Result Value Ref Range Status   SARS Coronavirus 2 by RT PCR NEGATIVE NEGATIVE Final    Comment: (NOTE) SARS-CoV-2 target nucleic acids are NOT DETECTED.  The SARS-CoV-2 RNA is generally detectable in upper respiratory specimens during the acute phase of infection. The lowest concentration of SARS-CoV-2 viral copies this assay can  detect is 138 copies/mL. A negative result does not preclude SARS-Cov-2 infection and should not be used as the sole basis for treatment or other patient management decisions. A negative result may occur with  improper specimen collection/handling, submission of specimen other than nasopharyngeal swab, presence of viral mutation(s) within the areas targeted by this assay, and inadequate number of viral copies(<138 copies/mL). A negative result must be combined with clinical observations, patient history, and epidemiological information. The expected result is Negative.  Fact Sheet for Patients:  EntrepreneurPulse.com.au  Fact Sheet for Healthcare Providers:  IncredibleEmployment.be  This test is no t yet approved or cleared by the [place] FDA and  has been authorized for detection and/or diagnosis of SARS-CoV-2 by FDA under an Emergency Use Authorization (EUA). This EUA will remain  in effect (meaning this test can be used) for the duration of the COVID-19 declaration under Section 564(b)(1) of the Act, 21 U.S.C.section 360bbb-3(b)(1), unless the authorization is terminated  or revoked sooner.       Influenza A by PCR NEGATIVE NEGATIVE Final   Influenza B by PCR NEGATIVE NEGATIVE Final    Comment: (NOTE) The Xpert Xpress SARS-CoV-2/FLU/RSV plus assay is intended as an aid in the diagnosis of influenza from Nasopharyngeal swab specimens and should not be used as a sole basis for treatment. Nasal washings and aspirates are unacceptable for Xpert Xpress SARS-CoV-2/FLU/RSV testing.  Fact Sheet for Patients: EntrepreneurPulse.com.au  Fact Sheet for Healthcare Providers: IncredibleEmployment.be  This test is not yet approved or cleared by the [place] FDA and has been authorized for detection and/or diagnosis of SARS-CoV-2 by FDA under an Emergency Use Authorization (EUA). This EUA will remain in  effect (meaning this test can be used) for the duration of the COVID-19 declaration under Section 564(b)(1) of the Act, 21 U.S.C. section 360bbb-3(b)(1), unless the authorization is terminated or revoked.  Performed at [facility], [address] [place], [place] [address]   Blood Culture (routine x 2)     Status: None (Preliminary result)   Collection Time: 01/12/22 11:51 AM   Specimen: BLOOD  Result Value Ref Range Status   Specimen Description BLOOD BLOOD LEFT WRIST  Final   Special Requests   Final    BOTTLES DRAWN AEROBIC AND ANAEROBIC Blood Culture adequate volume   Culture   Final    NO GROWTH 4 DAYS Performed at [facility], [address] [place], [place] [address]    Report Status PENDING  Incomplete  Urine Culture     Status: None   Collection Time: 01/12/22 11:51 AM   Specimen: Urine, Random  Result Value Ref Range Status  Specimen Description   Final    URINE, RANDOM Performed at [facility], [address], [place], [place] [address]    Special Requests   Final    NONE Performed at [facility], [address] [place], [place] [address]    Culture   Final    NO GROWTH Performed at [facility], [place] [address], [place], [place] [address]    Report Status 01/13/2022 FINAL  Final  Respiratory (~20 pathogens) panel by PCR     Status: None   Collection Time: 01/12/22 11:51 AM   Specimen: Nasopharyngeal Swab; Respiratory  Result Value Ref Range Status   Adenovirus NOT DETECTED NOT DETECTED Final   Coronavirus 229E NOT DETECTED NOT DETECTED Final    Comment: (NOTE) The Coronavirus on the Respiratory Panel, DOES NOT test for the novel  Coronavirus (2019 nCoV)    Coronavirus HKU1 NOT DETECTED NOT DETECTED Final   Coronavirus NL63 NOT DETECTED NOT DETECTED Final   Coronavirus OC43 NOT DETECTED NOT DETECTED Final   Metapneumovirus NOT DETECTED NOT DETECTED Final   Rhinovirus / Enterovirus NOT DETECTED NOT  DETECTED Final   Influenza A NOT DETECTED NOT DETECTED Final   Influenza B NOT DETECTED NOT DETECTED Final   Parainfluenza Virus 1 NOT DETECTED NOT DETECTED Final   Parainfluenza Virus 2 NOT DETECTED NOT DETECTED Final   Parainfluenza Virus 3 NOT DETECTED NOT DETECTED Final   Parainfluenza Virus 4 NOT DETECTED NOT DETECTED Final   Respiratory Syncytial Virus NOT DETECTED NOT DETECTED Final   Bordetella pertussis NOT DETECTED NOT DETECTED Final   Bordetella Parapertussis NOT DETECTED NOT DETECTED Final   Chlamydophila pneumoniae NOT DETECTED NOT DETECTED Final   Mycoplasma pneumoniae NOT DETECTED NOT DETECTED Final    Comment: Performed at [facility], [place], [place], [place] [address]  Culture, blood (Routine X 2) w Reflex to ID Panel     Status: None (Preliminary result)   Collection Time: 01/13/22  5:13 AM   Specimen: BLOOD  Result Value Ref Range Status   Specimen Description BLOOD RIGHT [facility]  Final   Special Requests   Final    BOTTLES DRAWN AEROBIC AND ANAEROBIC Blood Culture adequate volume   Culture   Final    NO GROWTH 3 DAYS Performed at [facility], [address] [place], [place] [address]    Report Status PENDING  Incomplete  MRSA Next Gen by PCR, Nasal     Status: None   Collection Time: 01/13/22  9:41 AM   Specimen: Nasal Mucosa; Nasal Swab  Result Value Ref Range Status   MRSA by PCR Next Gen NOT DETECTED NOT DETECTED Final    Comment: (NOTE) The GeneXpert MRSA Assay (FDA approved for NASAL specimens only), is one component of a comprehensive MRSA colonization surveillance program. It is not intended to diagnose MRSA infection nor to guide or monitor treatment for MRSA infections. Test performance is not FDA approved in patients less than 64 years old. Performed at [facility], [address], [place], [place] [address]       Radiology Studies: DG Shoulder Right  Result Date: 01/14/2022 CLINICAL DATA:  Recent fall EXAM:  RIGHT SHOULDER - 2+ VIEW COMPARISON:  Chest x-ray 01/12/2022, 05/16/2021 FINDINGS: No fracture or malalignment. Mild AC joint degenerative change. Multiple age indeterminate right-sided rib fractures. IMPRESSION: 1. No acute osseous abnormality at the shoulder. 2. Multiple age indeterminate right-sided rib fractures Electronically Signed   By: Maudie Mercury  Francoise Ceo M.D.   On: 01/14/2022 19:24   DG Chest Port 1 View  Result Date: 01/15/2022 CLINICAL DATA:  Atelectasis EXAM: PORTABLE CHEST 1 VIEW COMPARISON:  AP chest 01/12/2022, 05/27/2019 FINDINGS: Cardiac silhouette and mediastinal contours are unchanged with mild enlargement of the cardiac silhouette and tortuous thoracic aorta again seen. Moderate bilateral interstitial thickening appears mildly worsened from most recent prior 01/12/2022 radiograph and more remote 05/27/2019 radiograph. Left basilar linear scarring appears chronic also on 05/27/2019 radiograph. It is difficult to exclude small pleural effusions. No pneumothorax is seen. Moderate multilevel degenerative disc changes of the thoracic spine with multilevel midthoracic and upper lumbar kyphoplasty cement. IMPRESSION: Chronic baseline interstitial scarring with superimposed interstitial thickening which may represent pulmonary edema versus pneumonia in the appropriate clinical setting. Electronically Signed   By: Yvonne Kendall M.D.   On: 01/15/2022 08:45    Scheduled Meds:  amoxicillin-clavulanate  1 tablet Oral BID   atorvastatin  40 mg Oral QHS   budesonide (PULMICORT) nebulizer solution  0.5 mg Nebulization BID   calcitonin (salmon)  1 spray Alternating Nares Daily   Chlorhexidine Gluconate Cloth  6 each Topical Daily   DULoxetine  60 mg Oral Daily   enoxaparin (LOVENOX) injection  40 mg Subcutaneous Q24H   ezetimibe  10 mg Oral QHS   feeding supplement  237 mL Oral BID BM   gabapentin  300 mg Oral TID   hydroxychloroquine  200 mg Oral BID   iron polysaccharides  150 mg Oral Daily    isosorbide mononitrate  15 mg Oral Daily   latanoprost  1 drop Both Eyes QHS   mouth rinse  15 mL Mouth Rinse BID   methylPREDNISolone  4 mg Oral QPC supper   Followed by   methylPREDNISolone  4 mg Oral 3 x daily with food   Followed by   methylPREDNISolone  8 mg Oral QHS   Followed by   Derrill Memo ON 01/17/2022] methylPREDNISolone  4 mg Oral QID   Followed by   Derrill Memo ON 01/18/2022] methylPREDNISolone  4 mg Oral TID   Followed by   Derrill Memo ON 01/19/2022] methylPREDNISolone  4 mg Oral BID   Followed by   Derrill Memo ON 01/20/2022] methylPREDNISolone  4 mg Oral Daily   metoprolol tartrate  12.5 mg Oral BID   multivitamin with minerals  1 tablet Oral Daily   pantoprazole (PROTONIX) IV  40 mg Intravenous Q12H   Continuous Infusions:   LOS: 4 days   Time spent: 42 minutes. More than 50% of the time was spent in counseling/coordination of care  Lorella Nimrod, MD [facility]  If 7PM-7AM, please contact night-coverage [email or web address]  01/16/2022, 3:31 PM   This record has been created using Systems analyst. Errors have been sought and corrected,but may not always be located. Such creation errors do not reflect on the standard of care.

## 2022-01-16 NOTE — Progress Notes (Addendum)
PULMONOLOGY         Date: 01/16/2022,   MRN# [id number] Erin [name] 1946-01-02     AdmissionWeight: 63.5 kg                 CurrentWeight: 63.5 kg   Referring physician: Dr Si Raider   CHIEF COMPLAINT:   Acute exacerbation of ILD with screroderma   HISTORY OF PRESENT ILLNESS   This is a pleasant patient with hx of RA GERD cad, HCC, came in for acute on set hypoxemia and febrile illness. She required 6l/min to reach normoxia in ED and was sent to ER from ALF.  She is being treated for pneumonia.  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   She has severe anemia and received trasnfusion today.   01/14/22- patient is resting comfortably in bed , shes improved clinically weaning O2.  Plan to continue current care till tommorow.    01/15/22- patient is clear to auscultation, she reports feeling much better and wants to work with PT today. We will try to get her off the supplemental O2 today. May initiate DC planning from pulm perspective. X ray shows multiple age sub acute rib fractures on right.    01/16/22- patient has plan for possible home dc vs palliative hospice at home. 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   PAST MEDICAL HISTORY   Past Medical History:  Diagnosis Date   Anemia    h/o   Cancer ([place])    melanoma x 3the last one was of her leg and surgeon removed it   Colon polyp    Complication of anesthesia    PT STATED THAT WITH JOINT SURGERY THAT SHE WAS GIVEN SOME TYPE OF ANESTHESIA THAT MADE HER HALLUCINATE    Coronary artery calcification    Coronary artery disease    DDD (degenerative disc disease), lumbar    Diverticulosis 01/20/2017   Dysphagia    Dyspnea    with exertion-unable to walk a mile without getting sob- dr sparks set pt up to see Dr Rockey Situ after 11-19-16 surgery   Environmental allergies    Esophageal dysmotility     Fibrocystic breast disease    GERD (gastroesophageal reflux disease)    Heart murmur    asymptomatic   Hyperlipidemia    unspecified   Hypertension    Mitral regurgitation    Monilial esophagitis ([place]) 01/20/2017   Obstipation    Osteopenia    Peptic ulcer disease    Pulmonary fibrosis ([place])    per Dr Raul Del   RA (rheumatoid arthritis) ([place])    Raynaud's disease    Redundant colon 01/20/2017   Scleroderma ([place])    Telangiectasia of colon    Tubular adenoma of colon 01/20/2017   unspecified   Vulvar dysplasia      SURGICAL HISTORY   Past Surgical History:  Procedure Laterality Date   ABDOMINAL HYSTERECTOMY     BREAST CYST EXCISION Left 20+ years ago   No scar visible   BREAST LUMPECTOMY Right    CARDIAC CATHETERIZATION     CARPAL TUNNEL RELEASE Bilateral    COLON SURGERY     colon polyp    COLONOSCOPY  09/30/2011   tubular adenoma rtm   COLONOSCOPY     05/02/2003, 04/10/2000   COLONOSCOPY WITH PROPOFOL N/A 01/20/2017   Procedure: COLONOSCOPY WITH PROPOFOL;  Surgeon: Lollie Sails, MD;  Location: [facility] ENDOSCOPY;  Service: Endoscopy;  Laterality: N/A;   COLONOSCOPY WITH PROPOFOL N/A 09/17/2020   Procedure: COLONOSCOPY WITH PROPOFOL;  Surgeon: Lesly Rubenstein, MD;  Location: [facility] ENDOSCOPY;  Service: Endoscopy;  Laterality: N/A;   CORONARY ATHERECTOMY N/A 11/21/2020   Procedure: CORONARY ATHERECTOMY;  Surgeon: Wellington Hampshire, MD;  Location: [facility] CV LAB;  Service: Cardiovascular;  Laterality: N/A;   CORONARY STENT INTERVENTION  11/21/2020   CORONARY STENT INTERVENTION N/A 11/21/2020   Procedure: CORONARY STENT INTERVENTION;  Surgeon: Wellington Hampshire, MD;  Location: [facility] CV LAB;  Service: Cardiovascular;  Laterality: N/A;   ESOPHAGOGASTRODUODENOSCOPY     ESOPHAGOGASTRODUODENOSCOPY     09/30/2011, 04/10/2000 , no repeat rtm   ESOPHAGOGASTRODUODENOSCOPY N/A 03/02/2018   Procedure: ESOPHAGOGASTRODUODENOSCOPY (EGD);  Surgeon: Virgel Manifold, MD;   Location: [facility] ENDOSCOPY;  Service: Endoscopy;  Laterality: N/A;   ESOPHAGOGASTRODUODENOSCOPY (EGD) WITH PROPOFOL N/A 01/20/2017   Procedure: ESOPHAGOGASTRODUODENOSCOPY (EGD) WITH PROPOFOL;  Surgeon: Lollie Sails, MD;  Location[facility] ENDOSCOPY;  Service: Endoscopy;  Laterality: N/A;   ESOPHAGOGASTRODUODENOSCOPY (EGD) WITH PROPOFOL N/A 09/17/2020   Procedure: ESOPHAGOGASTRODUODENOSCOPY (EGD) WITH PROPOFOL;  Surgeon: Lesly Rubenstein, MD;  Location: [facility] ENDOSCOPY;  Service: Endoscopy;  Laterality: N/A;   EXCISION HYDRADENITIS LABIA Left 11/19/2016   Procedure: EXCISION LABIAL MASS;  Surgeon: Robert Bellow, MD;  Location: [facility] ORS;  Service: General;  Laterality: Left;   EYE SURGERY Bilateral    cataracts   HEEL SPUR EXCISION     HIP ARTHROPLASTY Left 02/23/2018   Procedure: ARTHROPLASTY BIPOLAR HIP (HEMIARTHROPLASTY);  Surgeon: Thornton Park, MD;  Location: [facility] ORS;  Service: Orthopedics;  Laterality: Left;   IR KYPHO LUMBAR INC FX REDUCE BONE BX UNI/BIL CANNULATION INC/IMAGING  12/16/2021   KYPHOPLASTY N/A 04/26/2019   Procedure: KYPHOPLASTY T7;  Surgeon: Hessie Knows, MD;  Location: [facility] ORS;  Service: Orthopedics;  Laterality: N/A;   KYPHOPLASTY N/A 05/17/2019   Procedure: KYPHOPLASTY T8;  Surgeon: Hessie Knows, MD;  Location: [facility] ORS;  Service: Orthopedics;  Laterality: N/A;   KYPHOPLASTY N/A 07/26/2019   Procedure: T-9 KYPHOPLASTY;  Surgeon: Hessie Knows, MD;  Location: [facility] ORS;  Service: Orthopedics;  Laterality: N/A;   MASS EXCISION Left 11/19/2016   Procedure: EXCISION LEFT THIGH MELANOMA;  Surgeon: Robert Bellow, MD;  Location: [facility] ORS;  Service: General;  Laterality: Left;   RIGHT/LEFT HEART CATH AND CORONARY ANGIOGRAPHY N/A 11/15/2020   Procedure: RIGHT/LEFT HEART CATH AND CORONARY ANGIOGRAPHY poss intervention;  Surgeon: Minna Merritts, MD;  Location: [place] [facility] LAB;  Service: Cardiovascular;  Laterality: N/A;   SENTINEL NODE BIOPSY Left 11/19/2016    Procedure: INGUINAL SENTINEL NODE BIOPSY;  Surgeon: Robert Bellow, MD;  Location: [facility] ORS;  Service: General;  Laterality: Left;   UPPER ESOPHAGEAL ENDOSCOPIC ULTRASOUND (EUS) N/A 01/29/2017   Procedure: UPPER ESOPHAGEAL ENDOSCOPIC ULTRASOUND (EUS);  Surgeon: Holly Bodily, MD;  Location[facility] END[facility]COPY;  Service: Endoscopy;  Laterality: N/A;     FAMILY HISTORY   Family History  Problem Relation Age of Onset   Endometrial cancer Mother 64   Hypertension Mother    Osteoporosis Mother    Bladder Cancer Father    COPD Father    Cerebral palsy Sister      SOCIAL HISTORY   Social History   Tobacco Use   Smoking status: Never   Smokeless tobacco: Never  Vaping Use   Vaping Use: Never used  Substance Use Topics  Alcohol use: No   Drug use: No     MEDICATIONS    Home Medication:    Current Medication:  Current Facility-Administered Medications:    acetaminophen (TYLENOL) tablet 650 mg, 650 mg, Oral, Q6H PRN, Agbata, Tochukwu, MD, 650 mg at 01/14/22 1635   ALPRAZolam (XANAX) tablet 0.5 mg, 0.5 mg, Oral, Q12H PRN, Wouk, Ailene Rud, MD   amoxicillin-clavulanate (AUGMENTIN) 500-125 MG per tablet 500 mg, 1 tablet, Oral, Q8H, Leanard Dimaio, MD, 500 mg at 01/16/22 0559   atorvastatin (LIPITOR) tablet 40 mg, 40 mg, Oral, QHS, Agbata, Tochukwu, MD, 40 mg at 01/15/22 2157   budesonide (PULMICORT) nebulizer solution 0.5 mg, 0.5 mg, Nebulization, BID, Agbata, Tochukwu, MD, 0.5 mg at 01/16/22 0706   calcitonin (salmon) (MIACALCIN/FORTICAL) nasal spray 1 spray, 1 spray, Alternating Nares, Daily, Agbata, Tochukwu, MD, 1 spray at 01/15/22 9509   Chlorhexidine Gluconate Cloth 2 % PADS 6 each, 6 each, Topical, Daily, Wouk, Ailene Rud, MD, 6 each at 01/15/22 0904   DULoxetine (CYMBALTA) DR capsule 60 mg, 60 mg, Oral, Daily, Agbata, Tochukwu, MD, 60 mg at 01/16/22 0847   enoxaparin (LOVENOX) injection 40 mg, 40 mg, Subcutaneous, Q24H, Wouk, Ailene Rud, MD, 40 mg at 01/15/22  1332   ezetimibe (ZETIA) tablet 10 mg, 10 mg, Oral, QHS, Agbata, Tochukwu, MD, 10 mg at 01/15/22 2157   feeding supplement (ENSURE ENLIVE / ENSURE PLUS) liquid 237 mL, 237 mL, Oral, BID BM, Wouk, Ailene Rud, MD, 237 mL at 01/16/22 0848   gabapentin (NEURONTIN) capsule 300 mg, 300 mg, Oral, TID, Wouk, Ailene Rud, MD, 300 mg at 01/16/22 0846   hydroxychloroquine (PLAQUENIL) tablet 200 mg, 200 mg, Oral, BID, Agbata, Tochukwu, MD, 200 mg at 01/16/22 0848   ipratropium-albuterol (DUONEB) 0.5-2.5 (3) MG/3ML nebulizer solution 3 mL, 3 mL, Nebulization, Q6H PRN, Agbata, Tochukwu, MD, 3 mL at 01/14/22 1957   iron polysaccharides (NIFEREX) capsule 150 mg, 150 mg, Oral, Daily, Agbata, Tochukwu, MD, 150 mg at 01/16/22 0849   isosorbide mononitrate (IMDUR) 24 hr tablet 15 mg, 15 mg, Oral, Daily, Wouk, Ailene Rud, MD, 15 mg at 01/16/22 0847   latanoprost (XALATAN) 0.005 % ophthalmic solution 1 drop, 1 drop, Both Eyes, QHS, Agbata, Tochukwu, MD, 1 drop at 01/15/22 2156   lip balm (BLISTEX) ointment, , Topical, PRN, Wouk, Ailene Rud, MD   MEDLINE mouth rinse, 15 mL, Mouth Rinse, BID, Wouk, Ailene Rud, MD, 15 mL at 01/16/22 0848   melatonin tablet 2.5 mg, 2.5 mg, Oral, QHS PRN, Foust, Katy L, NP, 2.5 mg at 01/14/22 2110   [COMPLETED] methylPREDNISolone (MEDROL) tablet 8 mg, 8 mg, Oral, QAC breakfast, 8 mg at 01/15/22 0911 **FOLLOWED BY** [COMPLETED] methylPREDNISolone (MEDROL) tablet 4 mg, 4 mg, Oral, QPC lunch, 4 mg at 01/15/22 1331 **FOLLOWED BY** methylPREDNISolone (MEDROL) tablet 4 mg, 4 mg, Oral, QPC supper **FOLLOWED BY** [COMPLETED] methylPREDNISolone (MEDROL) tablet 8 mg, 8 mg, Oral, QHS, 8 mg at 01/15/22 2156 **FOLLOWED BY** methylPREDNISolone (MEDROL) tablet 4 mg, 4 mg, Oral, 3 x daily with food, 4 mg at 01/16/22 0846 **FOLLOWED BY** methylPREDNISolone (MEDROL) tablet 8 mg, 8 mg, Oral, QHS **FOLLOWED BY** [START ON 01/17/2022] methylPREDNISolone (MEDROL) tablet 4 mg, 4 mg, Oral, QID **FOLLOWED BY**  [START ON 01/18/2022] methylPREDNISolone (MEDROL) tablet 4 mg, 4 mg, Oral, TID **FOLLOWED BY** [START ON 01/19/2022] methylPREDNISolone (MEDROL) tablet 4 mg, 4 mg, Oral, BID **FOLLOWED BY** [START ON 01/20/2022] methylPREDNISolone (MEDROL) tablet 4 mg, 4 mg, Oral, Daily, Virl Cagey E, RPH   metoprolol tartrate (LOPRESSOR) tablet  12.5 mg, 12.5 mg, Oral, BID, Wouk, Ailene Rud, MD, 12.5 mg at 01/16/22 0093   multivitamin with minerals tablet 1 tablet, 1 tablet, Oral, Daily, Agbata, Tochukwu, MD, 1 tablet at 01/16/22 0846   nitroGLYCERIN (NITROSTAT) SL tablet 0.4 mg, 0.4 mg, Sublingual, Q5 min PRN, Agbata, Tochukwu, MD   ondansetron (ZOFRAN) tablet 4 mg, 4 mg, Oral, Q6H PRN **OR** ondansetron (ZOFRAN) injection 4 mg, 4 mg, Intravenous, Q6H PRN, Agbata, Tochukwu, MD, 4 mg at 01/14/22 0850   pantoprazole (PROTONIX) injection 40 mg, 40 mg, Intravenous, Q12H, Wouk, Ailene Rud, MD, 40 mg at 01/16/22 0847   polyethylene glycol (MIRALAX / GLYCOLAX) packet 17 g, 17 g, Oral, Daily PRN, Agbata, Tochukwu, MD    ALLERGIES   Remicade [infliximab], Sulfa antibiotics, Actonel [risedronate sodium], Fosamax [alendronate sodium], Procardia [nifedipine], Elemental sulfur, and Wellbutrin [bupropion]     REVIEW OF SYSTEMS    Review of Systems:  Gen:  Denies  fever, sweats, chills weigh loss  HEENT: Denies blurred vision, double vision, ear pain, eye pain, hearing loss, nose bleeds, sore throat Cardiac:  No dizziness, chest pain or heaviness, chest tightness,edema Resp:   Denies cough or sputum porduction, shortness of breath,wheezing, hemoptysis,  Gi: Denies swallowing difficulty, stomach pain, nausea or vomiting, diarrhea, constipation, bowel incontinence Gu:  Denies bladder incontinence, burning urine Ext:   Denies Joint pain, stiffness or swelling Skin: Denies  skin rash, easy bruising or bleeding or hives Endoc:  Denies polyuria, polydipsia , polyphagia or weight change Psych:   Denies depression,  insomnia or hallucinations   Other:  All other systems negative   VS: BP 119/78    Pulse 65    Temp 98.1 F (36.7 C)    Resp 19    Ht 5' (1.524 m)    Wt 63.5 kg    LMP  (LMP Unknown)    SpO2 97%    BMI 27.34 kg/m      PHYSICAL EXAM    GENERAL:NAD, no fevers, chills, no weakness no fatigue HEAD: Normocephalic, atraumatic.  EYES: Pupils equal, round, reactive to light. Extraocular muscles intact. No scleral icterus.  MOUTH: Moist mucosal membrane. Dentition intact. No abscess noted.  EAR, NOSE, THROAT: Clear without exudates. No external lesions.  NECK: Supple. No thyromegaly. No nodules. No JVD.  PULMONARY: mild ronchi bilaterally  CARDIOVASCULAR: S1 and S2. Regular rate and rhythm. No murmurs, rubs, or gallops. No edema. Pedal pulses 2+ bilaterally.  GASTROINTESTINAL: Soft, nontender, nondistended. No masses. Positive bowel sounds. No hepatosplenomegaly.  MUSCULOSKELETAL: No swelling, clubbing, or edema. Range of motion full in all extremities.  NEUROLOGIC: Cranial nerves II through XII are intact. No gross focal neurological deficits. Sensation intact. Reflexes intact.  SKIN: No ulceration, lesions, rashes, or cyanosis. Skin warm and dry. Turgor intact.  PSYCHIATRIC: Mood, affect within normal limits. The patient is awake, alert and oriented x 3. Insight, judgment intact.       IMAGING    CT ABDOMEN PELVIS WO CONTRAST  Result Date: 01/12/2022 CLINICAL DATA:  Abdominal pain, acute. EXAM: CT ABDOMEN AND PELVIS WITHOUT CONTRAST TECHNIQUE: Multidetector CT imaging of the abdomen and pelvis was performed following the standard protocol without IV contrast. RADIATION DOSE REDUCTION: This exam was performed according to the departmental dose-optimization program which includes automated exposure control, adjustment of the mA and/or kV according to patient size and/or use of iterative reconstruction technique. COMPARISON:  CT pelvis 12/09/2021.  Chest radiography same day. FINDINGS: Lower  chest: Bilateral pleural effusions layering dependently with dependent atelectasis.  Some areas of pulmonary scarring and some pleural emphysematous change related to chronic lung disease. The heart is enlarged. There are coronary artery calcifications. Hepatobiliary: Liver parenchyma is normal. Question sludge or small stones dependent in the gallbladder. Pancreas: Normal Spleen: Normal Adrenals/Urinary Tract: Adrenal glands are normal. Kidneys are normal. No cyst, mass, stone or hydronephrosis. Foley catheter in the bladder. Stomach/Bowel: Stomach and small intestine are normal. Moderate amount of stool and gas within the colon but no acute finding. Vascular/Lymphatic: Aortic atherosclerosis. No aneurysm. IVC is normal. No adenopathy. Reproductive: Limited detail because of artifact from the hip replacement. No evidence of pelvic mass. Other: No free fluid or air. Musculoskeletal: Previous vertebral augmentations at T9 and L1. Lower lumbar degenerative changes. Previous hip replacement on the left. Fatty atrophy of the thigh musculature on right. IMPRESSION: No acute finding to explain abdominal pain. Bilateral pleural effusions layering dependently with dependent atelectasis. Chronic lung disease. Cardiomegaly, coronary artery calcification and aortic atherosclerotic calcification. Question small amount of sludge or small stones dependent in the gallbladder. Consider ultrasound correlation. Moderate amount of stool and gas in the colon, but not out of the range of normal. Electronically Signed   By: Nelson Chimes M.D.   On: 01/12/2022 15:53   DG Chest 1 View  Result Date: 01/12/2022 CLINICAL DATA:  Respiratory distress EXAM: CHEST  1 VIEW COMPARISON:  January 12, 2022 at 11:52 a.m. FINDINGS: Bilateral patchy pulmonary opacities persists but appears somewhat improved in the interval. The cardiomediastinal silhouette is stable. No pneumothorax. No other changes. IMPRESSION: Persistent but mildly improved bilateral  patchy pulmonary infiltrates. Electronically Signed   By: Dorise Bullion III M.D.   On: 01/12/2022 13:58   DG Shoulder Right  Result Date: 01/14/2022 CLINICAL DATA:  Recent fall EXAM: RIGHT SHOULDER - 2+ VIEW COMPARISON:  Chest x-ray 01/12/2022, 05/16/2021 FINDINGS: No fracture or malalignment. Mild AC joint degenerative change. Multiple age indeterminate right-sided rib fractures. IMPRESSION: 1. No acute osseous abnormality at the shoulder. 2. Multiple age indeterminate right-sided rib fractures Electronically Signed   By: Donavan Foil M.D.   On: 01/14/2022 19:24   MR Lumbar Spine W Wo Contrast  Result Date: 01/13/2022 CLINICAL DATA:  Fever.  Recent L1 kyphoplasty. EXAM: MRI LUMBAR SPINE WITHOUT AND WITH CONTRAST TECHNIQUE: Multiplanar and multiecho pulse sequences of the lumbar spine were obtained without and with intravenous contrast. CONTRAST:  68mL GADAVIST GADOBUTROL 1 MMOL/ML IV SOLN COMPARISON:  MRI lumbar spine dated December 09, 2021. FINDINGS: Segmentation:  Standard. Alignment: Unchanged mild retrolisthesis at L2-L3. Unchanged trace anterolisthesis at L4-L5 and L5-S1. Vertebrae: Interval cement augmentation at L1 with mild residual marrow edema. No progressive height loss. No new fracture, evidence of discitis, or suspicious bone lesion. Conus medullaris and cauda equina: Conus extends to the L2 level. Conus and cauda equina appear normal. No intrathecal enhancement. Paraspinal and other soft tissues: Small 1.7 cm intrinsically T1 hyperintense fluid collection with low T2 and T1 signal rim adjacent to the left L1 posterior elements, most consistent with a small hematoma. Disc levels: T12-L1:  Negative. L1-L2: Unchanged mild retropulsion of the L1 posteroinferior endplate. No significant disc bulge or herniation. No stenosis. L2-L3: Unchanged asymmetric left-sided disc bulging and endplate spurring. Unchanged moderate left neuroforaminal stenosis. No spinal canal or right neuroforaminal stenosis.  L3-L4: Unchanged small broad-based posterior disc protrusion and mild bilateral facet arthropathy. No stenosis. L4-L5: Unchanged small broad-based posterior disc protrusion and moderate bilateral facet arthropathy. No stenosis. L5-S1: Unchanged small broad-based posterior disc protrusion eccentric to the  right. Severe right and mild left facet arthropathy with bulky ligamentum flavum hypertrophy. New prominent right perifacet marrow edema. Unchanged mild right lateral recess stenosis and moderate right neuroforaminal stenosis. No spinal canal or left neuroforaminal stenosis. IMPRESSION: 1. Interval cement augmentation at L1 without progressive height loss. No new fracture or other acute abnormality. 2. Multilevel lumbar spondylosis as described above, overall similar to prior study. Unchanged moderate left neuroforaminal stenosis at L2-L3 and moderate right neuroforaminal stenosis at L5-S1. 3. Progressive severe right facet arthropathy at L5-S1 with new degenerative inflammatory change, which can be a source of pain. Electronically Signed   By: Titus Dubin M.D.   On: 01/13/2022 15:57   DG Chest Port 1 View  Result Date: 01/15/2022 CLINICAL DATA:  Atelectasis EXAM: PORTABLE CHEST 1 VIEW COMPARISON:  AP chest 01/12/2022, 05/27/2019 FINDINGS: Cardiac silhouette and mediastinal contours are unchanged with mild enlargement of the cardiac silhouette and tortuous thoracic aorta again seen. Moderate bilateral interstitial thickening appears mildly worsened from most recent prior 01/12/2022 radiograph and more remote 05/27/2019 radiograph. Left basilar linear scarring appears chronic also on 05/27/2019 radiograph. It is difficult to exclude small pleural effusions. No pneumothorax is seen. Moderate multilevel degenerative disc changes of the thoracic spine with multilevel midthoracic and upper lumbar kyphoplasty cement. IMPRESSION: Chronic baseline interstitial scarring with superimposed interstitial thickening which  may represent pulmonary edema versus pneumonia in the appropriate clinical setting. Electronically Signed   By: Yvonne Kendall M.D.   On: 01/15/2022 08:45   DG Chest Port 1 View  Result Date: 01/12/2022 CLINICAL DATA:  76 year old female with possible sepsis. EXAM: PORTABLE CHEST 1 VIEW COMPARISON:  CTA chest 05/28/2019 and earlier. FINDINGS: Portable AP upright view at 1153 hours. Chronically reduced lung volumes with evidence of interstitial lung disease on 2020 CT. Multifocal bilateral patchy and confluent new pulmonary opacity since radiographs in June 2020. No superimposed pneumothorax or pleural effusion. No definite consolidation. Previously augmented thoracic and lumbar compression fractures. Mediastinal contours remain normal. Visualized tracheal air column is within normal limits. No acute osseous abnormality identified. Negative visible bowel gas. IMPRESSION: Multifocal bilateral patchy lung opacity opacity superimposed on suspected chronic interstitial lung disease with diminished lung volumes. Favor acute bilateral pneumonia in the setting of suspected sepsis. No pleural effusion. Electronically Signed   By: Genevie Ann M.D.   On: 01/12/2022 12:07      ASSESSMENT/PLAN   Acute hypoxemic respiratory failure   Likely due to acute exacerbation of ILD/pulmonary fibrosis    - etiology may be LRTI with viral infection    - Resp viral panel ordered    - rheumatoid factor    - CRP trend    -PCT is low 0.15     -agree with plaqenil 200 bid     - medrol dose pack started post bid Soluemedrol , repeat CXR today   Severe normocytic anemia    - s/p blood transfusion      Patietn denies blood in stool or urine   Thank you for allowing me to participate in the care of this patient.   Patient/Family are satisfied with care plan and all questions have been answered.  This document was prepared using Dragon voice recognition software and may include unintentional dictation errors.     Ottie Glazier, M.D.  Division of [place]

## 2022-01-16 NOTE — Progress Notes (Signed)
[facility]) Hospital Liaison Note   Received request from Transitions of Care Manager, Jiles Garter, for hospice services at [facility] after discharge. MSW contacted family to explain/discuss services and did not receive an answer. MSW left VM requesting call be returned at earliest convenience. Patient to be reviewed once consent received from family and confirmation of DME needs obtained.    [facility] information and contact numbers given to family & above information shared with TOC.   Please call with any questions/concerns.    Thank you for the opportunity to participate in this patient's care.   Daphene Calamity, MSW [facility] Liaison  [phone]

## 2022-01-16 NOTE — Progress Notes (Signed)
Physical Therapy Treatment Patient Details Name: Erin [name] MRN: [id number] DOB: [birth year] Today's Date: 01/16/2022   History of Present Illness Erin [name] is a 76 y.o. female with medical history significant for hypertension, coronary artery disease, GERD who was sent from the assisted living facility where she currently resides to the ER for evaluation of fever and hypoxia.    PT Comments    Pt received in bed on RA, son at bedside. O2 sats 88% after transferring to edge of bed with MinA. Pt able to maintain unsupported balance x 2 minutes. Sit to stand with MinA to RW. Pt able to side step to bedside recliner with close CGA and cues for safe technique. Once sitting, pt's O2 sats assessed (took a minute to register) pt at 84% on RA and DOE. 2L O2 reapplied for pt to return to low 90's.  Pt appears to be regaining strength and may benefit from returning to [facility] with HHPT and initial assist for transfers/care. Will continue PT per POC.   Recommendations for follow up therapy are one component of a multi-disciplinary discharge planning process, led by the attending physician.  Recommendations may be updated based on patient status, additional functional criteria and insurance authorization.  Follow Up Recommendations  Skilled nursing-short term rehab (<3 hours/day)     Assistance Recommended at Discharge Intermittent Supervision/Assistance  Patient can return home with the following A little help with walking and/or transfers;A little help with bathing/dressing/bathroom;Assistance with cooking/housework;Direct supervision/assist for financial management;Assist for transportation;Assistance with feeding;Direct supervision/assist for medications management;Help with stairs or ramp for entrance   Equipment Recommendations  Other (comment) (Pt will benefit from returning to [facility] and receive HHPT services since mobility has improved.)    Recommendations for Other Services        Precautions / Restrictions Precautions Precautions: Fall Restrictions Weight Bearing Restrictions: No     Mobility  Bed Mobility Overal bed mobility: Needs Assistance Bed Mobility: Supine to Sit     Supine to sit: Min assist     General bed mobility comments: not assessed as patient started and ended session in recliner    Transfers Overall transfer level: Needs assistance Equipment used: Rolling walker (2 wheels) Transfers: Sit to/from Stand, Bed to chair/wheelchair/BSC Sit to Stand: Min assist           General transfer comment: [place] cues for hand placement and technique    Ambulation/Gait                   Stairs             Wheelchair Mobility    Modified Rankin (Stroke Patients Only)       Balance                                            Cognition Arousal/Alertness: Awake/alert Behavior During Therapy: WFL for tasks assessed/performed Overall Cognitive Status: History of cognitive impairments - at baseline                                 General Comments: A&Ox3, situation, location, and year. Dementia at baseline. follows simple commands with 100% accuracy and appropriate conversation throughout.        Exercises General Exercises - Lower Extremity Ankle Circles/Pumps: Seated, AROM, 20 reps, Right, Left Long  Arc Quad: Seated, AROM, 20 reps, Right, Left, Both Hip ABduction/ADduction: AROM, Both, 20 reps Hip Flexion/Marching: AROM, Both, 20 reps, Seated    General Comments General comments (skin integrity, edema, etc.): Pt and son educated on POC and d/c recommendations. Pt's O2 sats decreased to 84% on RA upon light exertion      Pertinent Vitals/Pain Pain Assessment Pain Assessment: No/denies pain    Home Living                          Prior Function            PT Goals (current goals can now be found in the care plan section) Acute Rehab PT Goals Patient Stated  Goal:  (Get stronger) Progress towards PT goals: Progressing toward goals    Frequency    Min 2X/week      PT Plan Current plan remains appropriate    Co-evaluation              AM-PAC PT "6 Clicks" Mobility   Outcome Measure  Help needed turning from your back to your side while in a flat bed without using bedrails?: A Lot Help needed moving from lying on your back to sitting on the side of a flat bed without using bedrails?: A Lot Help needed moving to and from a bed to a chair (including a wheelchair)?: A Little Help needed standing up from a chair using your arms (e.g., wheelchair or bedside chair)?: A Little Help needed to walk in hospital room?: A Lot Help needed climbing 3-5 steps with a railing? : Total 6 Click Score: 13    End of Session Equipment Utilized During Treatment: Gait belt;Oxygen Activity Tolerance: Patient limited by fatigue Patient left: in chair;with call bell/phone within reach;with family/visitor present Nurse Communication: Mobility status PT Visit Diagnosis: Other abnormalities of gait and mobility (R26.89);Muscle weakness (generalized) (M62.81);Difficulty in walking, not elsewhere classified (R26.2)     Time: 0240-9735 PT Time Calculation (min) (ACUTE ONLY): 58 min  Charges:  $Therapeutic Exercise: 8-22 mins $Therapeutic Activity: 38-52 mins                    [name], PTA    [name] 01/16/2022, 10:44 AM

## 2022-01-16 NOTE — Progress Notes (Signed)
Palliative: [name], Erin [name], is sitting up in bed.  She greets me, making and mostly keeping eye contact.  She appears acutely/chronically ill and somewhat frail.  She is alert and oriented x3, able to make her basic needs known.  There is no family at bedside at this time.  We talk for a few minutes about her goals and disposition.  During our conversation, her healthcare surrogate, her pastor, Erin [name] arrives.  We talk about Erin [name] disposition concerns.  We talked at length about short-term rehab, how physical therapy is provided.  Erin [name] shares that he and friends/church members will drop by at varying times to check on her.  Erin [name] shares her concern about care that she receives in the nursing home.  She shares that the rehab is limited and she just sits in the chair or lies in the bed.  She says she feels anxiety about going to rehab.  We also talked about returning to her own home versus returning to [name].  Erin [name] shares that she feels she needs help preparing her medications.  At this point it seems that patient and family are leaning toward returning to [name] over returning to her own home.    We talk about services at [facility].  Erin [name] shares that Erin [name] is paying for full service care at [name], including medication administration.  We talk about home health, PT/OT at [name].  We talk about Erin [name] ability and desire to participate in rehab   We talk about the benefits of at home "treat the treatable" hospice care.  We talked about what is and is not provided with hospice, aide for bathing 3 times a week, registered nurse every week or 2, time to build relationships.  Erin [name] shares that he is experienced with hospice and feels that hospice would be a good choice to help care for Erin [name].  We talk about provider choice.  Erin [name] is going to investigate local agencies.  I shared that he can ask social worker at short-term rehab or [name] to set up hospice if/when they so desire.   I shared that Erin [name] cannot have physical therapy/rehab at the same time as hospice.  Rehab/PT must be completed before being accepted to hospice care.  Erin [name] is leaning on her healthcare surrogate, Erin [name], for support with decision-making.  Conference with attending, bedside nursing staff, transition of care team related to patient condition, needs, goals of care, disposition.  Plan:   Considering short-term rehab versus returning to [place] with home health PT.  Erin [name] and Erin [name] are open to at home "treat the treatable" hospice services.  They are considering provider choice.  28 minutes  Erin Axe, NP Palliative medicine team Team phone [phone] Greater than 50% of this time was spent counseling and coordinating care related to the above assessment and plan.

## 2022-01-16 NOTE — Progress Notes (Signed)
Occupational Therapy Treatment Patient Details Name: Erin [name] MRN: [id number] DOB: September 24, 1946 Today's Date: 01/16/2022   History of present illness Erin [name] is a 76 y.o. female with medical history significant for hypertension, coronary artery disease, GERD who was sent from the assisted living facility where she currently resides to the ER for evaluation of fever and hypoxia.   OT comments  Ms Kimbell was seen for OT treatment on this date. Upon arrival to room pt seated in chair, agreeable to tx. Pt requires MIN A + RW for toilet t/f, SUPERVISION perihygiene with lateral leans. Pt unable to tolerate standing handwashing, completes with SETUP and SUPERVISION seated in chair. SpO2 low 80s on 2L [place], improved to 88% on 3L [place]. Pt given built up handle for self-feeding. Pt making good progress toward goals. Pt continues to benefit from skilled OT services to maximize return to PLOF and minimize risk of future falls, injury, caregiver burden, and readmission. Will continue to follow POC. Discharge recommendation remains appropriate.     Recommendations for follow up therapy are one component of a multi-disciplinary discharge planning process, led by the attending physician.  Recommendations may be updated based on patient status, additional functional criteria and insurance authorization.    Follow Up Recommendations  Skilled nursing-short term rehab (<3 hours/day)    Assistance Recommended at Discharge Frequent or constant Supervision/Assistance  Patient can return home with the following  A little help with walking and/or transfers;A little help with bathing/dressing/bathroom;Help with stairs or ramp for entrance;Assistance with cooking/housework   Equipment Recommendations  BSC/3in1    Recommendations for Other Services      Precautions / Restrictions Precautions Precautions: Fall Restrictions Weight Bearing Restrictions: No       Mobility Bed Mobility Overal bed mobility:  Needs Assistance             General bed mobility comments: received and left sitting    Transfers Overall transfer level: Needs assistance Equipment used: Rolling walker (2 wheels) Transfers: Sit to/from Stand, Bed to chair/wheelchair/BSC Sit to Stand: Min assist           General transfer comment: multiple attemps however unabel to rise from chair height     Balance Overall balance assessment: Needs assistance Sitting-balance support: No upper extremity supported, Feet supported Sitting balance-Leahy Scale: Fair     Standing balance support: During functional activity, Single extremity supported Standing balance-Leahy Scale: Poor Standing balance comment: posterior LOB with single UE support                           ADL either performed or assessed with clinical judgement   ADL Overall ADL's : Needs assistance/impaired                                       General ADL Comments: MIN A + RW for toilet t/f, SUPERVISION perihygiene with lateral leans. Pt unable to tolerate standing handwashing, completes with SETUP and SUPERVISION seated in chair. Pt given built up handle for self-feeding.      Cognition Arousal/Alertness: Awake/alert Behavior During Therapy: WFL for tasks assessed/performed Overall Cognitive Status: History of cognitive impairments - at baseline  General Comments SpO2 mid 80s on 2L [place], improved to 88% on 3L [place]    Pertinent Vitals/ Pain       Pain Assessment Pain Assessment: No/denies pain   Frequency  Min 2X/week        Progress Toward Goals  OT Goals(current goals can now be found in the care plan section)  Progress towards OT goals: Progressing toward goals  Acute Rehab OT Goals Patient Stated Goal: to go home OT Goal Formulation: With patient Time For Goal Achievement: 01/29/22 Potential to Achieve Goals: Good ADL Goals Pt Will  Perform Grooming: with modified independence;sitting Pt Will Perform Lower Body Dressing: with modified independence;sit to/from stand Pt Will Transfer to Toilet: with modified independence;ambulating;regular height toilet  Plan Discharge plan remains appropriate;Frequency remains appropriate    Co-evaluation                 AM-PAC OT "6 Clicks" Daily Activity     Outcome Measure   Help from another person eating meals?: A Little Help from another person taking care of personal grooming?: A Little Help from another person toileting, which includes using toliet, bedpan, or urinal?: A Little Help from another person bathing (including washing, rinsing, drying)?: A Lot Help from another person to put on and taking off regular upper body clothing?: A Little Help from another person to put on and taking off regular lower body clothing?: A Lot 6 Click Score: 16    End of Session Equipment Utilized During Treatment: Rolling walker (2 wheels)  OT Visit Diagnosis: Other abnormalities of gait and mobility (R26.89);Muscle weakness (generalized) (M62.81)   Activity Tolerance Patient tolerated treatment well   Patient Left in chair;with call bell/phone within reach;with chair alarm set   Nurse Communication Mobility status        Time: 6389-3734 OT Time Calculation (min): 38 min  Charges: OT General Charges $OT Visit: 1 Visit OT Treatments $Self Care/Home Management : 38-52 mins  Dessie Coma, M.S. OTR/L  01/16/22, 2:16 PM  ascom [phone]

## 2022-01-17 DIAGNOSIS — A419 Sepsis, unspecified organism: Secondary | ICD-10-CM | POA: Diagnosis not present

## 2022-01-17 DIAGNOSIS — J9601 Acute respiratory failure with hypoxia: Secondary | ICD-10-CM | POA: Diagnosis not present

## 2022-01-17 DIAGNOSIS — J84112 Idiopathic pulmonary fibrosis: Secondary | ICD-10-CM | POA: Diagnosis not present

## 2022-01-17 DIAGNOSIS — R6520 Severe sepsis without septic shock: Secondary | ICD-10-CM | POA: Diagnosis not present

## 2022-01-17 LAB — BASIC METABOLIC PANEL
Anion gap: 6 (ref 5–15)
BUN: 10 mg/dL (ref 8–23)
CO2: 28 mmol/L (ref 22–32)
Calcium: 8.3 mg/dL — ABNORMAL LOW (ref 8.9–10.3)
Chloride: 98 mmol/L (ref 98–111)
Creatinine, Ser: 0.61 mg/dL (ref 0.44–1.00)
GFR, Estimated: >60 mL/min (ref >60)
Glucose, Bld: 114 mg/dL — ABNORMAL HIGH (ref 70–99)
Potassium: 4.1 mmol/L (ref 3.5–5.1)
Sodium: 132 mmol/L — ABNORMAL LOW (ref 135–145)

## 2022-01-17 LAB — CULTURE, BLOOD (ROUTINE X 2)
Culture: NO GROWTH
Special Requests: ADEQUATE

## 2022-01-17 LAB — CBC
HCT: 29.2 % — ABNORMAL LOW (ref 36.0–46.0)
Hemoglobin: 9.1 g/dL — ABNORMAL LOW (ref 12.0–15.0)
MCH: 26.5 pg (ref 26.0–34.0)
MCHC: 31.2 g/dL (ref 30.0–36.0)
MCV: 84.9 fL (ref 80.0–100.0)
Platelets: 699 10*3/uL — ABNORMAL HIGH (ref 150–400)
RBC: 3.44 MIL/uL — ABNORMAL LOW (ref 3.87–5.11)
RDW: 16.8 % — ABNORMAL HIGH (ref 11.5–15.5)
WBC: 11.8 10*3/uL — ABNORMAL HIGH (ref 4.0–10.5)
nRBC: 0 % (ref 0.0–0.2)

## 2022-01-17 LAB — C-REACTIVE PROTEIN: CRP: 8.1 mg/dL — ABNORMAL HIGH (ref <1.0)

## 2022-01-17 MED ORDER — ENSURE ENLIVE PO LIQD: 237.0000 mL | Freq: Two times a day (BID) | ORAL | 12 refills | Status: AC

## 2022-01-17 MED ORDER — METHYLPREDNISOLONE 4 MG PO TABS: 4.0000 mg | ORAL_TABLET | Freq: Four times a day (QID) | ORAL | 0 refills | Status: AC

## 2022-01-17 MED ORDER — ALPRAZOLAM 0.5 MG PO TABS: 0.5000 mg | ORAL_TABLET | Freq: Two times a day (BID) | ORAL | Status: AC | PRN

## 2022-01-17 MED ORDER — GABAPENTIN 600 MG PO TABS: 300.0000 mg | ORAL_TABLET | Freq: Three times a day (TID) | ORAL | Status: AC

## 2022-01-17 MED ORDER — AMOXICILLIN-POT CLAVULANATE 500-125 MG PO TABS: 1.0000 | ORAL_TABLET | Freq: Two times a day (BID) | ORAL | 0 refills | Status: AC

## 2022-01-17 NOTE — Care Management Important Message (Signed)
Important Message  Patient Details  Name: Erin [name] MRN: [id number] Date of Birth: 1945-12-26   Medicare Important Message Given:  Yes     Juliann Pulse A Jennise Both 01/17/2022, 11:40 AM

## 2022-01-17 NOTE — TOC Progression Note (Addendum)
Transition of Care [facility]) - Progression Note    Patient Details  Name: Erin [name] MRN: [id number] Date of Birth: [birth year]  Transition of Care [facility]) CM/SW [place], LCSW Phone Number: 01/17/2022, 10:36 AM  Clinical Narrative:   Per [facility] Rep Lorayne Bender, family is requesting DC to [facility] with hospice pending o2 delivery today. CSW called Lattie Haw at [facility] who confirmed patient can come back there, but will need a hospital bed to be delivered to [facility] prior to DC. Inquired with Lorayne Bender if this can be accommodated.  10:42- Per Lorayne Bender, they have confirmed patient has a hospital bed which was delivered to [facility] on 2/3. CSW notified Lattie Haw at [facility].  11:07- DNR left on chart for MD to sign, MD notified. EMS paperwork complete and placed in DC folder.  Expected Discharge Plan: Skilled Nursing Facility Barriers to Discharge: Continued Medical Work up  Expected Discharge Plan and Services Expected Discharge Plan: [place]                                               Social Determinants of Health (SDOH) Interventions    Readmission Risk Interventions Readmission Risk Prevention Plan 05/29/2019  Transportation Screening Not Complete  Transportation Screening Comment Pt was independent and driving prior to getting sick, when she become sick her cousin provided transportation however cousin is now sick - will need to address closer to [place] or Home Care Consult Patient refused  Medication Review (RN Care Manager) Complete  Some recent data might be hidden

## 2022-01-17 NOTE — NC FL2 (Signed)
[place] LEVEL OF CARE SCREENING TOOL     IDENTIFICATION  Patient Name: Erin [name] Birthdate: 09/16/1946 Sex: female Admission Date (Current Location): 01/12/2022  [facility] and [place] Number:  Engineering geologist and Address:  [facility], [address] [place], [place] [address]      Provider Number: [id number]  Attending Physician Name and Address:  Lorella Nimrod, MD  Relative Name and Phone Number:  [name] [phone]    Current Level of Care: Hospital Recommended Level of Care: [place] Prior Approval Number:    Date Approved/Denied:   PASRR Number: [id number]  Discharge Plan: SNF    Current Diagnoses: Patient Active Problem List   Diagnosis Date Noted   Sepsis ([place]) 01/12/2022   Acute respiratory failure ([place])    Acute metabolic encephalopathy    Closed compression fracture of body of L1 vertebra (HCC) 12/09/2021   Chronic diastolic CHF (congestive heart failure) ([place]) 12/09/2021   Depression with anxiety 12/09/2021   Normocytic anemia    Fall at home, initial encounter    Angina pectoris ([place]) 11/21/2020   Unstable angina ([place]) 10/30/2020   Bilateral carotid artery stenosis 10/30/2020   Colon polyp 08/31/2019   [name] S/P percutaneous coronary angioplasty 08/31/2019   Degenerative disc disease, lumbar 08/31/2019   Dysphagia 08/31/2019   Environmental allergies 08/31/2019   Esophageal dysmotility 08/31/2019   Fibrocystic breast disease 08/31/2019   Mitral regurgitation 08/31/2019   Acute respiratory disease due to COVID-19 virus 05/28/2019   Food in esophagus causing other injury, initial encounter 04/21/2019   Gastro-esophageal reflux disease with esophagitis 04/21/2019   Gastric nodule 04/21/2019   Varicose veins of leg with swelling, bilateral 09/62/8366   Acute diastolic CHF (congestive heart failure) ([place]) 03/18/2018   Lower extremity edema 03/18/2018   Age-related osteoporosis with current  pathological fracture 03/04/2018   Anemia 02/28/2018   S/P hip hemiarthroplasty 02/26/2018   Hip fracture ([place]) 02/22/2018   Lumbar radiculopathy 01/20/2018   Polyneuropathy 01/20/2018   Chronic insomnia 01/15/2018   Hyperlipidemia 10/26/2017   Coronary artery disease of native artery of native heart with stable angina pectoris ([place]) 10/24/2017   Vulvar irritation 09/02/2017   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) 12/16/2016   SOB (shortness of breath) 12/15/2016   Idiopathic interstitial fibrosis of lung syndrome ([place]) 12/15/2016   Raynaud's disease 12/15/2016   Melanoma of skin ([place]) 11/11/2016   Lesion of labia 11/11/2016   HTN, goal below 140/90 08/29/2014   Encounter for long-term current use of medication 03/30/2014   Scleroderma ([place]) 03/15/2014   Rheumatoid arthritis ([place]) 03/15/2014   Osteopenia 03/15/2014    Orientation RESPIRATION BLADDER Height & Weight     Self, Time, Situation, Place  O2 Incontinent, External catheter Weight: 140 lb (63.5 kg) Height:  5' (152.4 cm)  BEHAVIORAL SYMPTOMS/MOOD NEUROLOGICAL BOWEL NUTRITION STATUS      Incontinent Diet (DYS 2)  AMBULATORY STATUS COMMUNICATION OF NEEDS Skin   Limited Assist Verbally Normal                       Personal Care Assistance Level of Assistance  Bathing, Feeding, Dressing Bathing Assistance: Limited assistance Feeding assistance: Independent Dressing Assistance: Limited assistance Total Care Assistance: Limited assistance   Functional Limitations Info  Sight, Hearing, Speech Sight Info: Adequate Hearing Info: Adequate Speech Info: Adequate    SPECIAL CARE FACTORS FREQUENCY  PT (By licensed PT),  OT (By licensed OT)     PT Frequency: min 4x weekly OT Frequency: min 4x weekly            Contractures Contractures Info: Not present    Additional Factors Info  Code Status, Allergies Code Status Info: DNR Allergies  Info: Remicade (Infliximab), Sulfa Antibiotics, Actonel (Risedronate Sodium), Fosamax (Alendronate Sodium), Procardia (Nifedipine), Elemental Sulfur, Wellbutrin (Bupropion)            STOP taking these medications     cephALEXin 500 MG capsule Commonly known as: KEFLEX    methocarbamol 500 MG tablet Commonly known as: ROBAXIN    Ofev 150 MG Caps Generic drug: Nintedanib    oxyCODONE-acetaminophen 5-325 MG tablet Commonly known as: PERCOCET/ROXICET    zolpidem 10 MG tablet Commonly known as: AMBIEN           TAKE these medications     acetaminophen 325 MG tablet Commonly known as: TYLENOL Take 2 tablets (650 mg total) by mouth every 6 (six) hours as needed for mild pain or fever.    albuterol 108 (90 Base) MCG/ACT inhaler Commonly known as: VENTOLIN HFA Inhale 2 puffs into the lungs every 4 (four) hours as needed for wheezing or shortness of breath.    ALPRAZolam 0.5 MG tablet Commonly known as: XANAX Take 1 tablet (0.5 mg total) by mouth every 12 (twelve) hours as needed for anxiety.    amLODipine 5 MG tablet Commonly known as: NORVASC Take 1 tablet (5 mg total) by mouth daily.    amoxicillin-clavulanate 500-125 MG tablet Commonly known as: AUGMENTIN Take 1 tablet (500 mg total) by mouth 2 (two) times daily for 3 doses.    aspirin 81 MG tablet Take 81 mg by mouth daily.    atorvastatin 40 MG tablet Commonly known as: LIPITOR Take 1 tablet (40 mg total) by mouth daily. What changed: when to take this    calcitonin (salmon) 200 UNIT/ACT nasal spray Commonly known as: MIACALCIN/FORTICAL Place 1 spray into alternate nostrils daily.    clopidogrel 75 MG tablet Commonly known as: PLAVIX Take 1 tablet (75 mg total) by mouth daily.    DULoxetine 60 MG capsule Commonly known as: CYMBALTA Take 60 mg by mouth daily. In the morning.    ezetimibe 10 MG tablet Commonly known as: ZETIA Take 1 tablet (10 mg total) by mouth daily. What changed: when to take  this    feeding supplement Liqd Take 237 mLs by mouth 2 (two) times daily between meals.    furosemide 20 MG tablet Commonly known as: LASIX Take 1 tablet (20 mg total) by mouth every other day. May take extra 20 mg for swelling or shortness of breath What changed: additional instructions    gabapentin 600 MG tablet Commonly known as: NEURONTIN Take 0.5 tablets (300 mg total) by mouth 3 (three) times daily. What changed: how much to take    hydroxychloroquine 200 MG tablet Commonly known as: PLAQUENIL Take 1 tablet (200 mg total) by mouth 2 (two) times daily.    iron polysaccharides 150 MG capsule Commonly known as: NIFEREX Take 1 capsule (150 mg total) by mouth daily.    isosorbide mononitrate 30 MG 24 hr tablet Commonly known as: IMDUR Take 0.5 tablets (15 mg total) by mouth daily.    latanoprost 0.005 % ophthalmic solution Commonly known as: XALATAN Place 1 drop into both eyes at bedtime.    methylPREDNISolone 4 MG tablet Commonly known as: MEDROL Take 1 tablet (4 mg total) by  mouth 4 (four) times daily. Followed by 3 times daily, decreased to 2 times daily for another day, then take once daily for 2 days before stopping it.    metoprolol tartrate 25 MG tablet Commonly known as: LOPRESSOR Take 0.5 tablets (12.5 mg total) by mouth 2 (two) times daily.    multivitamin with minerals Tabs tablet Take 1 tablet by mouth daily.    nitroGLYCERIN 0.4 MG SL tablet Commonly known as: NITROSTAT DISSOLVE 1 TABLET UNDER THE TONGUE EVERY5 MINUTES AS NEEDED FOR CHEST PAIN    polyethylene glycol 17 g packet Commonly known as: MIRALAX / GLYCOLAX Take 17 g by mouth daily as needed (constipation.).      Relevant Imaging Results:  Relevant Lab Results:   Additional Information SS #: [id number]  [place], LCSW

## 2022-01-17 NOTE — TOC Transition Note (Signed)
Transition of Care [facility]) - CM/SW Discharge Note   Patient Details  Name: Erin [name] MRN: [id number] Date of Birth: 05/20/1946  Transition of Care [facility]) CM/SW Contact:  Magnus Ivan, LCSW Phone Number: 01/17/2022, 1:51 PM   Clinical Narrative:    Discharge to [facility] with [facility] today. Confirmed with [facility] Representative Lorayne Bender and [employer]. Updated MD, RN, and patient's contact person [name]. FL2 sent with DC meds in HUB  via Lisa's request.  EMS paperwork completed. DNR in DC packet.  EMS arranged for 5 pm (or later pending truck availability) per [employer] request.   Final next level of care: Assisted Living Barriers to Discharge: Barriers Resolved   Patient Goals and CMS Choice Patient states their goals for this hospitalization and ongoing recovery are:: to go home CMS Medicare.gov Compare Post Acute Care list provided to:: Patient Choice offered to / list presented to : Patient  Discharge Placement              Patient chooses bed at: Other - please specify in the comment section below: [facility]) Patient to be transferred to facility by: [facility]      Discharge Plan and Services                                     Social Determinants of Health (SDOH) Interventions     Readmission Risk Interventions Readmission Risk Prevention Plan 05/29/2019  Transportation Screening Not Complete  Transportation Screening Comment Pt was independent and driving prior to getting sick, when she become sick her cousin provided transportation however cousin is now sick - will need to address closer to [place] or Home Care Consult Patient refused  Medication Review (RN Care Manager) Complete  Some recent data might be hidden

## 2022-01-17 NOTE — Care Management Important Message (Signed)
Important Message  Patient Details  Name: Erin [name] MRN: [id number] Date of Birth: [birth year]   Medicare Important Message Given:  Yes     Erin [name] 01/17/2022, 11:34 AM

## 2022-01-17 NOTE — Discharge Summary (Signed)
Physician Discharge Summary   Patient: Erin [name] MRN: [id number] DOB: [birth year]  Admit date:     01/12/2022  Discharge date: 01/17/22  Discharge Physician: Lorella Nimrod   PCP: Idelle Crouch, MD   Recommendations at discharge:  Please follow-up closely with your pulmonologist. You might need oxygen due to worsening lung condition-use as needed You are being given a tapering dose of Medrol-take it as directed You also need an appointment with your urologist as you are being discharged with Foley catheter.  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 female with medical history significant for hypertension, coronary artery disease, rheumatoid arthritis, scleroderma, interstitial lung disease, hypertension, urinary retention and GERD who was sent from the assisted living facility where she currently resides to the ER for evaluation of fever and hypoxia.  Admitted for sepsis secondary to pneumonia, initially received cefepime, Flagyl and vancomycin, later cefepime was continued and not transition to Augmentin. Concern of acute exacerbation of scleroderma and interstitial lung disease by pulmonology, received steroid, now started on Medrol pack.  We were able to wean her off from oxygen but she is high risk for becoming hypoxic and will need as needed oxygen.   Also found to have hemoglobin of 6.9 with baseline around 9.  No obvious bleeding.  Received 1 unit of PRBC with improvement in hemoglobin to 8.8.  Also had thrombocytosis, most likely reactive. She will continue home dose of aspirin and Plavix.   Patient had recent hospitalization after  having a fall and found to have L1 compression fracture s/p kyphoplasty by IR.  Her hospital course was complicated by urinary retention and she was discharged to subacute rehab with a Foley catheter which was removed by urology on 01/09/2021 followed by reinsertion on the same day because of voiding difficulty.  She received a course of Keflex by urology for a suspected UTI earlier this month.  She will need close follow-up with her urologist for further recommendations as she is being discharged with Foley catheter.   Palliative care was also consulted due to life limiting comorbidities, PT is recommending SNF.  Patient and family decided to go back to [place] with home hospice services.   Consultants: Pulmonology Procedures performed: None Disposition: Assisted living Diet recommendation:  Discharge Diet Orders (From admission, onward)     Start     Ordered   01/17/22 0000  Diet - low sodium heart healthy        01/17/22 1335           Dysphagia type 2   Liquid thin  DISCHARGE MEDICATION: Allergies as of 01/17/2022       Reactions   Remicade [infliximab] Shortness Of Breath, Itching   Sulfa Antibiotics Other (See Comments)   Other Reaction: tounge cracked   Actonel [risedronate Sodium] Other (See Comments)   Fosamax [alendronate Sodium]    GI Bleed   Procardia [nifedipine] Hives   Elemental Sulfur Swelling   Wellbutrin [bupropion] Anxiety        Medication List     STOP taking these medications    cephALEXin 500 MG capsule Commonly known as: KEFLEX   methocarbamol 500 MG tablet  Commonly known as: ROBAXIN   Ofev 150 MG Caps Generic drug: Nintedanib   oxyCODONE-acetaminophen 5-325 MG tablet Commonly known as: PERCOCET/ROXICET   zolpidem 10 MG tablet Commonly known as: AMBIEN       TAKE these medications    acetaminophen 325 MG tablet Commonly known as: TYLENOL Take 2 tablets (650 mg total) by mouth every 6 (six) hours as needed for mild pain or  fever.   albuterol 108 (90 Base) MCG/ACT inhaler Commonly known as: VENTOLIN HFA Inhale 2 puffs into the lungs every 4 (four) hours as needed for wheezing or shortness of breath.   ALPRAZolam 0.5 MG tablet Commonly known as: XANAX Take 1 tablet (0.5 mg total) by mouth every 12 (twelve) hours as needed for anxiety.   amLODipine 5 MG tablet Commonly known as: NORVASC Take 1 tablet (5 mg total) by mouth daily.   amoxicillin-clavulanate 500-125 MG tablet Commonly known as: AUGMENTIN Take 1 tablet (500 mg total) by mouth 2 (two) times daily for 3 doses.   aspirin 81 MG tablet Take 81 mg by mouth daily.   atorvastatin 40 MG tablet Commonly known as: LIPITOR Take 1 tablet (40 mg total) by mouth daily. What changed: when to take this   calcitonin (salmon) 200 UNIT/ACT nasal spray Commonly known as: MIACALCIN/FORTICAL Place 1 spray into alternate nostrils daily.   clopidogrel 75 MG tablet Commonly known as: PLAVIX Take 1 tablet (75 mg total) by mouth daily.   DULoxetine 60 MG capsule Commonly known as: CYMBALTA Take 60 mg by mouth daily. In the morning.   ezetimibe 10 MG tablet Commonly known as: ZETIA Take 1 tablet (10 mg total) by mouth daily. What changed: when to take this   feeding supplement Liqd Take 237 mLs by mouth 2 (two) times daily between meals.   furosemide 20 MG tablet Commonly known as: LASIX Take 1 tablet (20 mg total) by mouth every other day. May take extra 20 mg for swelling or shortness of breath What changed: additional instructions   gabapentin 600 MG tablet Commonly known as: NEURONTIN Take 0.5 tablets (300 mg total) by mouth 3 (three) times daily. What changed: how much to take   hydroxychloroquine 200 MG tablet Commonly known as: PLAQUENIL Take 1 tablet (200 mg total) by mouth 2 (two) times daily.   iron polysaccharides 150 MG capsule Commonly known as: NIFEREX Take 1 capsule (150 mg total) by mouth daily.   isosorbide mononitrate 30 MG  24 hr tablet Commonly known as: IMDUR Take 0.5 tablets (15 mg total) by mouth daily.   latanoprost 0.005 % ophthalmic solution Commonly known as: XALATAN Place 1 drop into both eyes at bedtime.   methylPREDNISolone 4 MG tablet Commonly known as: MEDROL Take 1 tablet (4 mg total) by mouth 4 (four) times daily. Followed by 3 times daily, decreased to 2 times daily for another day, then take once daily for 2 days before stopping it.   metoprolol tartrate 25 MG tablet Commonly known as: LOPRESSOR Take 0.5 tablets (12.5 mg total) by mouth 2 (two) times daily.   multivitamin with minerals Tabs tablet Take 1 tablet by mouth daily.   nitroGLYCERIN 0.4 MG SL tablet Commonly known as: NITROSTAT DISSOLVE 1 TABLET UNDER THE TONGUE EVERY5 MINUTES AS NEEDED FOR CHEST PAIN   polyethylene glycol 17 g packet Commonly known as: MIRALAX / GLYCOLAX Take 17 g by mouth daily as needed (constipation.).        Follow-up Information     Idelle Crouch,  MD. Schedule an appointment as soon as possible for a visit in 1 week(s).   Specialty: Internal Medicine Contact information: [address] [facility] [place] [address] [phone]         Minna Merritts, MD .   Specialty: Cardiology Contact information: [place] [address] [phone]         Ottie Glazier, MD Follow up in 1 week(s).   Specialty: Pulmonary Disease Contact information: [place] [address] [phone]                 Discharge Exam: Danley Danker Weights   01/12/22 1132  Weight: 63.5 kg   General.  Frail elderly lady, in no acute distress. Pulmonary.  Bilateral scattered dry crackles, normal respiratory effort. CV.  Regular rate and rhythm, no JVD, rub or murmur. Abdomen.  Soft, nontender, nondistended, BS positive. CNS.  Alert and oriented .  No focal neurologic deficit. Extremities.  No edema, no cyanosis, pulses intact  and symmetrical. Psychiatry.  Judgment and insight appears normal.   Condition at discharge: stable  The results of significant diagnostics from this hospitalization (including imaging, microbiology, ancillary and laboratory) are listed below for reference.   Imaging Studies: CT ABDOMEN PELVIS WO CONTRAST  Result Date: 01/12/2022 CLINICAL DATA:  Abdominal pain, acute. EXAM: CT ABDOMEN AND PELVIS WITHOUT CONTRAST TECHNIQUE: Multidetector CT imaging of the abdomen and pelvis was performed following the standard protocol without IV contrast. RADIATION DOSE REDUCTION: This exam was performed according to the departmental dose-optimization program which includes automated exposure control, adjustment of the mA and/or kV according to patient size and/or use of iterative reconstruction technique. COMPARISON:  CT pelvis 12/09/2021.  Chest radiography same day. FINDINGS: Lower chest: Bilateral pleural effusions layering dependently with dependent atelectasis. Some areas of pulmonary scarring and some pleural emphysematous change related to chronic lung disease. The heart is enlarged. There are coronary artery calcifications. Hepatobiliary: Liver parenchyma is normal. Question sludge or small stones dependent in the gallbladder. Pancreas: Normal Spleen: Normal Adrenals/Urinary Tract: Adrenal glands are normal. Kidneys are normal. No cyst, mass, stone or hydronephrosis. Foley catheter in the bladder. Stomach/Bowel: Stomach and small intestine are normal. Moderate amount of stool and gas within the colon but no acute finding. Vascular/Lymphatic: Aortic atherosclerosis. No aneurysm. IVC is normal. No adenopathy. Reproductive: Limited detail because of artifact from the hip replacement. No evidence of pelvic mass. Other: No free fluid or air. Musculoskeletal: Previous vertebral augmentations at T9 and L1. Lower lumbar degenerative changes. Previous hip replacement on the left. Fatty atrophy of the thigh musculature on  right. IMPRESSION: No acute finding to explain abdominal pain. Bilateral pleural effusions layering dependently with dependent atelectasis. Chronic lung disease. Cardiomegaly, coronary artery calcification and aortic atherosclerotic calcification. Question small amount of sludge or small stones dependent in the gallbladder. Consider ultrasound correlation. Moderate amount of stool and gas in the colon, but not out of the range of normal. Electronically Signed   By: Nelson Chimes M.D.   On: 01/12/2022 15:53   DG Chest 1 View  Result Date: 01/12/2022 CLINICAL DATA:  Respiratory distress EXAM: CHEST  1 VIEW COMPARISON:  January 12, 2022 at 11:52 a.m. FINDINGS: Bilateral patchy pulmonary opacities persists but appears somewhat improved in the interval. The cardiomediastinal silhouette is stable. No pneumothorax. No other changes. IMPRESSION: Persistent but mildly improved bilateral patchy pulmonary infiltrates. Electronically Signed   By: Dorise Bullion III M.D.   On: 01/12/2022 13:58  DG Shoulder Right  Result Date: 01/14/2022 CLINICAL DATA:  Recent fall EXAM: RIGHT SHOULDER - 2+ VIEW COMPARISON:  Chest x-ray 01/12/2022, 05/16/2021 FINDINGS: No fracture or malalignment. Mild AC joint degenerative change. Multiple age indeterminate right-sided rib fractures. IMPRESSION: 1. No acute osseous abnormality at the shoulder. 2. Multiple age indeterminate right-sided rib fractures Electronically Signed   By: Donavan Foil M.D.   On: 01/14/2022 19:24   MR Lumbar Spine W Wo Contrast  Result Date: 01/13/2022 CLINICAL DATA:  Fever.  Recent L1 kyphoplasty. EXAM: MRI LUMBAR SPINE WITHOUT AND WITH CONTRAST TECHNIQUE: Multiplanar and multiecho pulse sequences of the lumbar spine were obtained without and with intravenous contrast. CONTRAST:  77mL GADAVIST GADOBUTROL 1 MMOL/ML IV SOLN COMPARISON:  MRI lumbar spine dated December 09, 2021. FINDINGS: Segmentation:  Standard. Alignment: Unchanged mild retrolisthesis at L2-L3.  Unchanged trace anterolisthesis at L4-L5 and L5-S1. Vertebrae: Interval cement augmentation at L1 with mild residual marrow edema. No progressive height loss. No new fracture, evidence of discitis, or suspicious bone lesion. Conus medullaris and cauda equina: Conus extends to the L2 level. Conus and cauda equina appear normal. No intrathecal enhancement. Paraspinal and other soft tissues: Small 1.7 cm intrinsically T1 hyperintense fluid collection with low T2 and T1 signal rim adjacent to the left L1 posterior elements, most consistent with a small hematoma. Disc levels: T12-L1:  Negative. L1-L2: Unchanged mild retropulsion of the L1 posteroinferior endplate. No significant disc bulge or herniation. No stenosis. L2-L3: Unchanged asymmetric left-sided disc bulging and endplate spurring. Unchanged moderate left neuroforaminal stenosis. No spinal canal or right neuroforaminal stenosis. L3-L4: Unchanged small broad-based posterior disc protrusion and mild bilateral facet arthropathy. No stenosis. L4-L5: Unchanged small broad-based posterior disc protrusion and moderate bilateral facet arthropathy. No stenosis. L5-S1: Unchanged small broad-based posterior disc protrusion eccentric to the right. Severe right and mild left facet arthropathy with bulky ligamentum flavum hypertrophy. New prominent right perifacet marrow edema. Unchanged mild right lateral recess stenosis and moderate right neuroforaminal stenosis. No spinal canal or left neuroforaminal stenosis. IMPRESSION: 1. Interval cement augmentation at L1 without progressive height loss. No new fracture or other acute abnormality. 2. Multilevel lumbar spondylosis as described above, overall similar to prior study. Unchanged moderate left neuroforaminal stenosis at L2-L3 and moderate right neuroforaminal stenosis at L5-S1. 3. Progressive severe right facet arthropathy at L5-S1 with new degenerative inflammatory change, which can be a source of pain. Electronically  Signed   By: Titus Dubin M.D.   On: 01/13/2022 15:57   DG Chest Port 1 View  Result Date: 01/15/2022 CLINICAL DATA:  Atelectasis EXAM: PORTABLE CHEST 1 VIEW COMPARISON:  AP chest 01/12/2022, 05/27/2019 FINDINGS: Cardiac silhouette and mediastinal contours are unchanged with mild enlargement of the cardiac silhouette and tortuous thoracic aorta again seen. Moderate bilateral interstitial thickening appears mildly worsened from most recent prior 01/12/2022 radiograph and more remote 05/27/2019 radiograph. Left basilar linear scarring appears chronic also on 05/27/2019 radiograph. It is difficult to exclude small pleural effusions. No pneumothorax is seen. Moderate multilevel degenerative disc changes of the thoracic spine with multilevel midthoracic and upper lumbar kyphoplasty cement. IMPRESSION: Chronic baseline interstitial scarring with superimposed interstitial thickening which may represent pulmonary edema versus pneumonia in the appropriate clinical setting. Electronically Signed   By: Yvonne Kendall M.D.   On: 01/15/2022 08:45   DG Chest Port 1 View  Result Date: 01/12/2022 CLINICAL DATA:  76 year old female with possible sepsis. EXAM: PORTABLE CHEST 1 VIEW COMPARISON:  CTA chest 05/28/2019 and earlier. FINDINGS: Portable AP upright  view at 1153 hours. Chronically reduced lung volumes with evidence of interstitial lung disease on 2020 CT. Multifocal bilateral patchy and confluent new pulmonary opacity since radiographs in June 2020. No superimposed pneumothorax or pleural effusion. No definite consolidation. Previously augmented thoracic and lumbar compression fractures. Mediastinal contours remain normal. Visualized tracheal air column is within normal limits. No acute osseous abnormality identified. Negative visible bowel gas. IMPRESSION: Multifocal bilateral patchy lung opacity opacity superimposed on suspected chronic interstitial lung disease with diminished lung volumes. Favor acute bilateral  pneumonia in the setting of suspected sepsis. No pleural effusion. Electronically Signed   By: Genevie Ann M.D.   On: 01/12/2022 12:07    Microbiology: Results for orders placed or performed during the hospital encounter of 01/12/22  Resp Panel by RT-PCR (Flu A&B, Covid) Nasopharyngeal Swab     Status: None   Collection Time: 01/12/22 11:51 AM   Specimen: Nasopharyngeal Swab; Nasopharyngeal(NP) swabs in vial transport medium  Result Value Ref Range Status   SARS Coronavirus 2 by RT PCR NEGATIVE NEGATIVE Final    Comment: (NOTE) SARS-CoV-2 target nucleic acids are NOT DETECTED.  The SARS-CoV-2 RNA is generally detectable in upper respiratory specimens during the acute phase of infection. The lowest concentration of SARS-CoV-2 viral copies this assay can detect is 138 copies/mL. A negative result does not preclude SARS-Cov-2 infection and should not be used as the sole basis for treatment or other patient management decisions. A negative result may occur with  improper specimen collection/handling, submission of specimen other than nasopharyngeal swab, presence of viral mutation(s) within the areas targeted by this assay, and inadequate number of viral copies(<138 copies/mL). A negative result must be combined with clinical observations, patient history, and epidemiological information. The expected result is Negative.  Fact Sheet for Patients:  EntrepreneurPulse.com.au  Fact Sheet for Healthcare Providers:  IncredibleEmployment.be  This test is no t yet approved or cleared by the [place] FDA and  has been authorized for detection and/or diagnosis of SARS-CoV-2 by FDA under an Emergency Use Authorization (EUA). This EUA will remain  in effect (meaning this test can be used) for the duration of the COVID-19 declaration under Section 564(b)(1) of the Act, 21 U.S.C.section 360bbb-3(b)(1), unless the authorization is terminated  or revoked sooner.        Influenza A by PCR NEGATIVE NEGATIVE Final   Influenza B by PCR NEGATIVE NEGATIVE Final    Comment: (NOTE) The Xpert Xpress SARS-CoV-2/FLU/RSV plus assay is intended as an aid in the diagnosis of influenza from Nasopharyngeal swab specimens and should not be used as a sole basis for treatment. Nasal washings and aspirates are unacceptable for Xpert Xpress SARS-CoV-2/FLU/RSV testing.  Fact Sheet for Patients: EntrepreneurPulse.com.au  Fact Sheet for Healthcare Providers: IncredibleEmployment.be  This test is not yet approved or cleared by the [place] FDA and has been authorized for detection and/or diagnosis of SARS-CoV-2 by FDA under an Emergency Use Authorization (EUA). This EUA will remain in effect (meaning this test can be used) for the duration of the COVID-19 declaration under Section 564(b)(1) of the Act, 21 U.S.C. section 360bbb-3(b)(1), unless the authorization is terminated or revoked.  Performed at [facility], [address] [place], [place] [address]   Blood Culture (routine x 2)     Status: None   Collection Time: 01/12/22 11:51 AM   Specimen: BLOOD  Result Value Ref Range Status   Specimen Description BLOOD BLOOD LEFT WRIST  Final   Special Requests   Final  BOTTLES DRAWN AEROBIC AND ANAEROBIC Blood Culture adequate volume   Culture   Final    NO GROWTH 5 DAYS Performed at [facility], [address] [place], [place] [address]    Report Status 01/17/2022 FINAL  Final  Urine Culture     Status: None   Collection Time: 01/12/22 11:51 AM   Specimen: Urine, Random  Result Value Ref Range Status   Specimen Description   Final    URINE, RANDOM Performed at [facility], [address], [place], [place] [address]    Special Requests   Final    NONE Performed at [facility], [address], [place], [place] [address]    Culture   Final    NO GROWTH Performed at  [facility], [place] [address] [place], [place] [address]    Report Status 01/13/2022 FINAL  Final  Respiratory (~20 pathogens) panel by PCR     Status: None   Collection Time: 01/12/22 11:51 AM   Specimen: Nasopharyngeal Swab; Respiratory  Result Value Ref Range Status   Adenovirus NOT DETECTED NOT DETECTED Final   Coronavirus 229E NOT DETECTED NOT DETECTED Final    Comment: (NOTE) The Coronavirus on the Respiratory Panel, DOES NOT test for the novel  Coronavirus (2019 nCoV)    Coronavirus HKU1 NOT DETECTED NOT DETECTED Final   Coronavirus NL63 NOT DETECTED NOT DETECTED Final   Coronavirus OC43 NOT DETECTED NOT DETECTED Final   Metapneumovirus NOT DETECTED NOT DETECTED Final   Rhinovirus / Enterovirus NOT DETECTED NOT DETECTED Final   Influenza A NOT DETECTED NOT DETECTED Final   Influenza B NOT DETECTED NOT DETECTED Final   Parainfluenza Virus 1 NOT DETECTED NOT DETECTED Final   Parainfluenza Virus 2 NOT DETECTED NOT DETECTED Final   Parainfluenza Virus 3 NOT DETECTED NOT DETECTED Final   Parainfluenza Virus 4 NOT DETECTED NOT DETECTED Final   Respiratory Syncytial Virus NOT DETECTED NOT DETECTED Final   Bordetella pertussis NOT DETECTED NOT DETECTED Final   Bordetella Parapertussis NOT DETECTED NOT DETECTED Final   Chlamydophila pneumoniae NOT DETECTED NOT DETECTED Final   Mycoplasma pneumoniae NOT DETECTED NOT DETECTED Final    Comment: Performed at [facility], [place]. [address] [place], [place] [address]  Culture, blood (Routine X 2) w Reflex to ID Panel     Status: None (Preliminary result)   Collection Time: 01/13/22  5:13 AM   Specimen: BLOOD  Result Value Ref Range Status   Specimen Description BLOOD RIGHT [facility]  Final   Special Requests   Final    BOTTLES DRAWN AEROBIC AND ANAEROBIC Blood Culture adequate volume   Culture   Final    NO GROWTH 4 DAYS Performed at [facility], [address] [place], [place] [address]    Report Status PENDING   Incomplete  MRSA Next Gen by PCR, Nasal     Status: None   Collection Time: 01/13/22  9:41 AM   Specimen: Nasal Mucosa; Nasal Swab  Result Value Ref Range Status   MRSA by PCR Next Gen NOT DETECTED NOT DETECTED Final    Comment: (NOTE) The GeneXpert MRSA Assay (FDA approved for NASAL specimens only), is one component of a comprehensive MRSA colonization surveillance program. It is not intended to diagnose MRSA infection nor to guide or monitor treatment for MRSA infections. Test performance is not FDA approved in patients less than 62 years old. Performed at [facility], [address] [place],  [place] [address]     Labs: CBC: Recent Labs  Lab 01/12/22 1151 01/13/22 0513 01/14/22 0508 01/15/22 0619 01/16/22 0502 01/17/22 0311  WBC 15.5* 13.5* 13.0* 10.7* 8.6 11.8*  NEUTROABS 12.5*  --   --   --   --   --   HGB 7.7* 6.9* 8.8* 8.6* 8.9* 9.1*  HCT 24.6* 22.3* 27.2* 26.7* 28.3* 29.2*  MCV 83.1 82.0 84.2 83.7 83.7 84.9  PLT 731* 658* 649* 669* 669* 604*   Basic Metabolic Panel: Recent Labs  Lab 01/13/22 0513 01/14/22 0508 01/15/22 0619 01/16/22 0502 01/17/22 0311  NA 131* 132* 134* 132* 132*  K 3.7 3.4* 3.7 4.0 4.1  CL 102 100 100 98 98  CO2 21* 23 27 28 28   GLUCOSE 56* 66* 87 106* 114*  BUN 11 8 9 10 10   CREATININE 0.75 0.50 0.46 0.55 0.61  CALCIUM 7.9* 7.9* 8.0* 8.0* 8.3*  MG  --  1.6* 1.5* 2.0  --    Liver Function Tests: Recent Labs  Lab 01/12/22 1151  AST 34  ALT 21  ALKPHOS 88  BILITOT 0.5  PROT 5.6*  ALBUMIN 2.4*   CBG: No results for input(s): GLUCAP in the last 168 hours.  Discharge time spent: greater than 30 minutes.  Signed: Lorella Nimrod, MD [facility] 01/17/2022

## 2022-01-17 NOTE — Plan of Care (Signed)

## 2022-01-17 NOTE — Progress Notes (Signed)
PULMONOLOGY         Date: 01/17/2022,   MRN# [id number] Erin [name] 1946-05-06     AdmissionWeight: 63.5 kg                 CurrentWeight: 63.5 kg   Referring physician: Dr Si Raider   CHIEF COMPLAINT:   Acute exacerbation of ILD with screroderma   HISTORY OF PRESENT ILLNESS   This is a pleasant patient with hx of RA GERD cad, HCC, came in for acute on set hypoxemia and febrile illness. She required 6l/min to reach normoxia in ED and was sent to ER from ALF.  She is being treated for pneumonia.  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   She has severe anemia and received trasnfusion today.   01/14/22- patient is resting comfortably in bed , shes improved clinically weaning O2.  Plan to continue current care till tommorow.    01/15/22- patient is clear to auscultation, she reports feeling much better and wants to work with PT today. We will try to get her off the supplemental O2 today. May initiate DC planning from pulm perspective. X ray shows multiple age sub acute rib fractures on right.    01/16/22- patient has plan for possible home dc vs palliative hospice at home. 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   01/17/22- patient is improved, she's off supplemental O2 now.  She has severe contraction I had to help her to take lids off coffe, grits and juice so she can have breakfast. 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   PAST MEDICAL HISTORY   Past Medical History:  Diagnosis Date   Anemia    h/o   Cancer ([place])    melanoma x 3the last one was of her leg and surgeon removed it   Colon polyp    Complication of anesthesia    PT STATED THAT WITH JOINT SURGERY THAT SHE WAS GIVEN SOME TYPE OF ANESTHESIA THAT MADE HER HALLUCINATE    Coronary artery calcification    Coronary artery disease    DDD (degenerative disc disease), lumbar    Diverticulosis  01/20/2017   Dysphagia    Dyspnea    with exertion-unable to walk a mile without getting sob- dr sparks set pt up to see Dr Rockey Situ after 11-19-16 surgery   Environmental allergies    Esophageal dysmotility    Fibrocystic breast disease    GERD (gastroesophageal reflux disease)    Heart murmur    asymptomatic   Hyperlipidemia    unspecified   Hypertension    Mitral regurgitation    Monilial esophagitis ([place]) 01/20/2017   Obstipation    Osteopenia    Peptic ulcer disease    Pulmonary fibrosis ([place])    per Dr Raul Del   RA (rheumatoid arthritis) ([place])    Raynaud's disease    Redundant colon 01/20/2017   Scleroderma ([place])    Telangiectasia of colon    Tubular adenoma of colon 01/20/2017   unspecified   Vulvar dysplasia      SURGICAL HISTORY   Past Surgical History:  Procedure Laterality Date   ABDOMINAL HYSTERECTOMY     BREAST CYST EXCISION Left 20+ years ago   No scar visible   BREAST LUMPECTOMY Right    CARDIAC CATHETERIZATION     CARPAL TUNNEL RELEASE Bilateral    COLON SURGERY  colon polyp    COLONOSCOPY  09/30/2011   tubular adenoma rtm   COLONOSCOPY     05/02/2003, 04/10/2000   COLONOSCOPY WITH PROPOFOL N/A 01/20/2017   Procedure: COLONOSCOPY WITH PROPOFOL;  Surgeon: Lollie Sails, MD;  Location: [facility] ENDOSCOPY;  Service: Endoscopy;  Laterality: N/A;   COLONOSCOPY WITH PROPOFOL N/A 09/17/2020   Procedure: COLONOSCOPY WITH PROPOFOL;  Surgeon: Lesly Rubenstein, MD;  Location: [facility] ENDOSCOPY;  Service: Endoscopy;  Laterality: N/A;   CORONARY ATHERECTOMY N/A 11/21/2020   Procedure: CORONARY ATHERECTOMY;  Surgeon: Wellington Hampshire, MD;  Location: [place] CV LAB;  Service: Cardiovascular;  Laterality: N/A;   CORONARY STENT INTERVENTION  11/21/2020   CORONARY STENT INTERVENTION N/A 11/21/2020   Procedure: CORONARY STENT INTERVENTION;  Surgeon: Wellington Hampshire, MD;  Location: [place] CV LAB;  Service: Cardiovascular;  Laterality: N/A;    ESOPHAGOGASTRODUODENOSCOPY     ESOPHAGOGASTRODUODENOSCOPY     09/30/2011, 04/10/2000 , no repeat rtm   ESOPHAGOGASTRODUODENOSCOPY N/A 03/02/2018   Procedure: ESOPHAGOGASTRODUODENOSCOPY (EGD);  Surgeon: Virgel Manifold, MD;  Location: [facility] ENDOSCOPY;  Service: Endoscopy;  Laterality: N/A;   ESOPHAGOGASTRODUODENOSCOPY (EGD) WITH PROPOFOL N/A 01/20/2017   Procedure: ESOPHAGOGASTRODUODENOSCOPY (EGD) WITH PROPOFOL;  Surgeon: Lollie Sails, MD;  Location: [facility] ENDOSCOPY;  Service: Endoscopy;  Laterality: N/A;   ESOPHAGOGASTRODUODENOSCOPY (EGD) WITH PROPOFOL N/A 09/17/2020   Procedure: ESOPHAGOGASTRODUODENOSCOPY (EGD) WITH PROPOFOL;  Surgeon: Lesly Rubenstein, MD;  Location: [facility] ENDOSCOPY;  Service: Endoscopy;  Laterality: N/A;   EXCISION HYDRADENITIS LABIA Left 11/19/2016   Procedure: EXCISION LABIAL MASS;  Surgeon: Robert Bellow, MD;  Location: [facility] ORS;  Service: General;  Laterality: Left;   EYE SURGERY Bilateral    cataracts   HEEL SPUR EXCISION     HIP ARTHROPLASTY Left 02/23/2018   Procedure: ARTHROPLASTY BIPOLAR HIP (HEMIARTHROPLASTY);  Surgeon: Thornton Park, MD;  Location: [facility] ORS;  Service: Orthopedics;  Laterality: Left;   IR KYPHO LUMBAR INC FX REDUCE BONE BX UNI/BIL CANNULATION INC/IMAGING  12/16/2021   KYPHOPLASTY N/A 04/26/2019   Procedure: KYPHOPLASTY T7;  Surgeon: Hessie Knows, MD;  Location: [facility] ORS;  Service: Orthopedics;  Laterality: N/A;   KYPHOPLASTY N/A 05/17/2019   Procedure: KYPHOPLASTY T8;  Surgeon: Hessie Knows, MD;  Location: [facility] ORS;  Service: Orthopedics;  Laterality: N/A;   KYPHOPLASTY N/A 07/26/2019   Procedure: T-9 KYPHOPLASTY;  Surgeon: Hessie Knows, MD;  Location: [facility] ORS;  Service: Orthopedics;  Laterality: N/A;   MASS EXCISION Left 11/19/2016   Procedure: EXCISION LEFT THIGH MELANOMA;  Surgeon: Robert Bellow, MD;  Location: [facility] ORS;  Service: General;  Laterality: Left;   RIGHT/LEFT HEART CATH AND CORONARY ANGIOGRAPHY N/A 11/15/2020    Procedure: RIGHT/LEFT HEART CATH AND CORONARY ANGIOGRAPHY poss intervention;  Surgeon: Minna Merritts, MD;  Location: [place] CV LAB;  Service: Cardiovascular;  Laterality: N/A;   SENTINEL NODE BIOPSY Left 11/19/2016   Procedure: INGUINAL SENTINEL NODE BIOPSY;  Surgeon: Robert Bellow, MD;  Location: [facility] ORS;  Service: General;  Laterality: Left;   UPPER ESOPHAGEAL ENDOSCOPIC ULTRASOUND (EUS) N/A 01/29/2017   Procedure: UPPER ESOPHAGEAL ENDOSCOPIC ULTRASOUND (EUS);  Surgeon: Holly Bodily, MD;  Location: [facility] ENDOSCOPY;  Service: Endoscopy;  Laterality: N/A;     FAMILY HISTORY   Family History  Problem Relation Age of Onset   Endometrial cancer Mother 70   Hypertension Mother    Osteoporosis Mother    Bladder Cancer Father    COPD Father    Cerebral palsy Sister  SOCIAL HISTORY   Social History   Tobacco Use   Smoking status: Never   Smokeless tobacco: Never  Vaping Use   Vaping Use: Never used  Substance Use Topics   Alcohol use: No   Drug use: No     MEDICATIONS    Home Medication:    Current Medication:  Current Facility-Administered Medications:    acetaminophen (TYLENOL) tablet 650 mg, 650 mg, Oral, Q6H PRN, Agbata, Tochukwu, MD, 650 mg at 01/14/22 1635   ALPRAZolam (XANAX) tablet 0.5 mg, 0.5 mg, Oral, Q12H PRN, Si Raider, Ailene Rud, MD, 0.5 mg at 01/16/22 2124   amoxicillin-clavulanate (AUGMENTIN) 500-125 MG per tablet 500 mg, 1 tablet, Oral, BID, Beers, Shanon Brow, RPH, 500 mg at 01/16/22 2125   atorvastatin (LIPITOR) tablet 40 mg, 40 mg, Oral, QHS, Agbata, Tochukwu, MD, 40 mg at 01/16/22 2124   budesonide (PULMICORT) nebulizer solution 0.5 mg, 0.5 mg, Nebulization, BID, Agbata, Tochukwu, MD, 0.5 mg at 01/17/22 0705   calcitonin (salmon) (MIACALCIN/FORTICAL) nasal spray 1 spray, 1 spray, Alternating Nares, Daily, Agbata, Tochukwu, MD, 1 spray at 01/15/22 8127   Chlorhexidine Gluconate Cloth 2 % PADS 6 each, 6 each, Topical, Daily, Wouk, Ailene Rud, MD, 6 each at 01/16/22 1430   DULoxetine (CYMBALTA) DR capsule 60 mg, 60 mg, Oral, Daily, Agbata, Tochukwu, MD, 60 mg at 01/16/22 0847   enoxaparin (LOVENOX) injection 40 mg, 40 mg, Subcutaneous, Q24H, Wouk, Ailene Rud, MD, 40 mg at 01/16/22 1152   ezetimibe (ZETIA) tablet 10 mg, 10 mg, Oral, QHS, Agbata, Tochukwu, MD, 10 mg at 01/16/22 2125   feeding supplement (ENSURE ENLIVE / ENSURE PLUS) liquid 237 mL, 237 mL, Oral, BID BM, Wouk, Ailene Rud, MD, 237 mL at 01/16/22 0848   gabapentin (NEURONTIN) capsule 300 mg, 300 mg, Oral, TID, Wouk, Ailene Rud, MD, 300 mg at 01/16/22 2124   hydroxychloroquine (PLAQUENIL) tablet 200 mg, 200 mg, Oral, BID, Agbata, Tochukwu, MD, 200 mg at 01/16/22 2125   ipratropium-albuterol (DUONEB) 0.5-2.5 (3) MG/3ML nebulizer solution 3 mL, 3 mL, Nebulization, Q6H PRN, Agbata, Tochukwu, MD, 3 mL at 01/14/22 1957   iron polysaccharides (NIFEREX) capsule 150 mg, 150 mg, Oral, Daily, Agbata, Tochukwu, MD, 150 mg at 01/16/22 0849   isosorbide mononitrate (IMDUR) 24 hr tablet 15 mg, 15 mg, Oral, Daily, Wouk, Ailene Rud, MD, 15 mg at 01/16/22 0847   latanoprost (XALATAN) 0.005 % ophthalmic solution 1 drop, 1 drop, Both Eyes, QHS, Agbata, Tochukwu, MD, 1 drop at 01/16/22 2124   lip balm (BLISTEX) ointment, , Topical, PRN, Wouk, Ailene Rud, MD   MEDLINE mouth rinse, 15 mL, Mouth Rinse, BID, Wouk, Ailene Rud, MD, 15 mL at 01/16/22 2124   melatonin tablet 2.5 mg, 2.5 mg, Oral, QHS PRN, Foust, Katy L, NP, 2.5 mg at 01/16/22 2124   [COMPLETED] methylPREDNISolone (MEDROL) tablet 8 mg, 8 mg, Oral, QAC breakfast, 8 mg at 01/15/22 0911 **FOLLOWED BY** [COMPLETED] methylPREDNISolone (MEDROL) tablet 4 mg, 4 mg, Oral, QPC lunch, 4 mg at 01/15/22 1331 **FOLLOWED BY** [EXPIRED] methylPREDNISolone (MEDROL) tablet 4 mg, 4 mg, Oral, QPC supper **FOLLOWED BY** [COMPLETED] methylPREDNISolone (MEDROL) tablet 8 mg, 8 mg, Oral, QHS, 8 mg at 01/15/22 2156 **FOLLOWED BY** [COMPLETED]  methylPREDNISolone (MEDROL) tablet 4 mg, 4 mg, Oral, 3 x daily with food, 4 mg at 01/16/22 1710 **FOLLOWED BY** [COMPLETED] methylPREDNISolone (MEDROL) tablet 8 mg, 8 mg, Oral, QHS, 8 mg at 01/16/22 2125 **FOLLOWED BY** methylPREDNISolone (MEDROL) tablet 4 mg, 4 mg, Oral, QID **FOLLOWED BY** [START ON 01/18/2022] methylPREDNISolone (  MEDROL) tablet 4 mg, 4 mg, Oral, TID **FOLLOWED BY** [START ON 01/19/2022] methylPREDNISolone (MEDROL) tablet 4 mg, 4 mg, Oral, BID **FOLLOWED BY** [START ON 01/20/2022] methylPREDNISolone (MEDROL) tablet 4 mg, 4 mg, Oral, Daily, Dorothe Pea, RPH   metoprolol tartrate (LOPRESSOR) tablet 12.5 mg, 12.5 mg, Oral, BID, Wouk, Ailene Rud, MD, 12.5 mg at 01/16/22 2124   multivitamin with minerals tablet 1 tablet, 1 tablet, Oral, Daily, Agbata, Tochukwu, MD, 1 tablet at 01/16/22 0846   nitroGLYCERIN (NITROSTAT) SL tablet 0.4 mg, 0.4 mg, Sublingual, Q5 min PRN, Agbata, Tochukwu, MD   ondansetron (ZOFRAN) tablet 4 mg, 4 mg, Oral, Q6H PRN **OR** ondansetron (ZOFRAN) injection 4 mg, 4 mg, Intravenous, Q6H PRN, Agbata, Tochukwu, MD, 4 mg at 01/14/22 0850   pantoprazole (PROTONIX) injection 40 mg, 40 mg, Intravenous, Q12H, Wouk, Ailene Rud, MD, 40 mg at 01/16/22 2124   polyethylene glycol (MIRALAX / GLYCOLAX) packet 17 g, 17 g, Oral, Daily PRN, Agbata, Tochukwu, MD    ALLERGIES   Remicade [infliximab], Sulfa antibiotics, Actonel [risedronate sodium], Fosamax [alendronate sodium], Procardia [nifedipine], Elemental sulfur, and Wellbutrin [bupropion]     REVIEW OF SYSTEMS    Review of Systems:  Gen:  Denies  fever, sweats, chills weigh loss  HEENT: Denies blurred vision, double vision, ear pain, eye pain, hearing loss, nose bleeds, sore throat Cardiac:  No dizziness, chest pain or heaviness, chest tightness,edema Resp:   Denies cough or sputum porduction, shortness of breath,wheezing, hemoptysis,  Gi: Denies swallowing difficulty, stomach pain, nausea or vomiting,  diarrhea, constipation, bowel incontinence Gu:  Denies bladder incontinence, burning urine Ext:   Denies Joint pain, stiffness or swelling Skin: Denies  skin rash, easy bruising or bleeding or hives Endoc:  Denies polyuria, polydipsia , polyphagia or weight change Psych:   Denies depression, insomnia or hallucinations   Other:  All other systems negative   VS: BP (!) 146/75 (BP Location: Right Arm)    Pulse 84    Temp 98.2 F (36.8 C)    Resp 18    Ht 5' (1.524 m)    Wt 63.5 kg    LMP  (LMP Unknown)    SpO2 100%    BMI 27.34 kg/m      PHYSICAL EXAM    GENERAL:NAD, no fevers, chills, no weakness no fatigue HEAD: Normocephalic, atraumatic.  EYES: Pupils equal, round, reactive to light. Extraocular muscles intact. No scleral icterus.  MOUTH: Moist mucosal membrane. Dentition intact. No abscess noted.  EAR, NOSE, THROAT: Clear without exudates. No external lesions.  NECK: Supple. No thyromegaly. No nodules. No JVD.  PULMONARY: mild ronchi bilaterally  CARDIOVASCULAR: S1 and S2. Regular rate and rhythm. No murmurs, rubs, or gallops. No edema. Pedal pulses 2+ bilaterally.  GASTROINTESTINAL: Soft, nontender, nondistended. No masses. Positive bowel sounds. No hepatosplenomegaly.  MUSCULOSKELETAL: No swelling, clubbing, or edema. Range of motion full in all extremities.  NEUROLOGIC: Cranial nerves II through XII are intact. No gross focal neurological deficits. Sensation intact. Reflexes intact.  SKIN: No ulceration, lesions, rashes, or cyanosis. Skin warm and dry. Turgor intact.  PSYCHIATRIC: Mood, affect within normal limits. The patient is awake, alert and oriented x 3. Insight, judgment intact.       IMAGING    CT ABDOMEN PELVIS WO CONTRAST  Result Date: 01/12/2022 CLINICAL DATA:  Abdominal pain, acute. EXAM: CT ABDOMEN AND PELVIS WITHOUT CONTRAST TECHNIQUE: Multidetector CT imaging of the abdomen and pelvis was performed following the standard protocol without IV contrast.  RADIATION DOSE REDUCTION: This  exam was performed according to the departmental dose-optimization program which includes automated exposure control, adjustment of the mA and/or kV according to patient size and/or use of iterative reconstruction technique. COMPARISON:  CT pelvis 12/09/2021.  Chest radiography same day. FINDINGS: Lower chest: Bilateral pleural effusions layering dependently with dependent atelectasis. Some areas of pulmonary scarring and some pleural emphysematous change related to chronic lung disease. The heart is enlarged. There are coronary artery calcifications. Hepatobiliary: Liver parenchyma is normal. Question sludge or small stones dependent in the gallbladder. Pancreas: Normal Spleen: Normal Adrenals/Urinary Tract: Adrenal glands are normal. Kidneys are normal. No cyst, mass, stone or hydronephrosis. Foley catheter in the bladder. Stomach/Bowel: Stomach and small intestine are normal. Moderate amount of stool and gas within the colon but no acute finding. Vascular/Lymphatic: Aortic atherosclerosis. No aneurysm. IVC is normal. No adenopathy. Reproductive: Limited detail because of artifact from the hip replacement. No evidence of pelvic mass. Other: No free fluid or air. Musculoskeletal: Previous vertebral augmentations at T9 and L1. Lower lumbar degenerative changes. Previous hip replacement on the left. Fatty atrophy of the thigh musculature on right. IMPRESSION: No acute finding to explain abdominal pain. Bilateral pleural effusions layering dependently with dependent atelectasis. Chronic lung disease. Cardiomegaly, coronary artery calcification and aortic atherosclerotic calcification. Question small amount of sludge or small stones dependent in the gallbladder. Consider ultrasound correlation. Moderate amount of stool and gas in the colon, but not out of the range of normal. Electronically Signed   By: Nelson Chimes M.D.   On: 01/12/2022 15:53   DG Chest 1 View  Result Date:  01/12/2022 CLINICAL DATA:  Respiratory distress EXAM: CHEST  1 VIEW COMPARISON:  January 12, 2022 at 11:52 a.m. FINDINGS: Bilateral patchy pulmonary opacities persists but appears somewhat improved in the interval. The cardiomediastinal silhouette is stable. No pneumothorax. No other changes. IMPRESSION: Persistent but mildly improved bilateral patchy pulmonary infiltrates. Electronically Signed   By: Dorise Bullion III M.D.   On: 01/12/2022 13:58   DG Shoulder Right  Result Date: 01/14/2022 CLINICAL DATA:  Recent fall EXAM: RIGHT SHOULDER - 2+ VIEW COMPARISON:  Chest x-ray 01/12/2022, 05/16/2021 FINDINGS: No fracture or malalignment. Mild AC joint degenerative change. Multiple age indeterminate right-sided rib fractures. IMPRESSION: 1. No acute osseous abnormality at the shoulder. 2. Multiple age indeterminate right-sided rib fractures Electronically Signed   By: Donavan Foil M.D.   On: 01/14/2022 19:24   MR Lumbar Spine W Wo Contrast  Result Date: 01/13/2022 CLINICAL DATA:  Fever.  Recent L1 kyphoplasty. EXAM: MRI LUMBAR SPINE WITHOUT AND WITH CONTRAST TECHNIQUE: Multiplanar and multiecho pulse sequences of the lumbar spine were obtained without and with intravenous contrast. CONTRAST:  96mL GADAVIST GADOBUTROL 1 MMOL/ML IV SOLN COMPARISON:  MRI lumbar spine dated December 09, 2021. FINDINGS: Segmentation:  Standard. Alignment: Unchanged mild retrolisthesis at L2-L3. Unchanged trace anterolisthesis at L4-L5 and L5-S1. Vertebrae: Interval cement augmentation at L1 with mild residual marrow edema. No progressive height loss. No new fracture, evidence of discitis, or suspicious bone lesion. Conus medullaris and cauda equina: Conus extends to the L2 level. Conus and cauda equina appear normal. No intrathecal enhancement. Paraspinal and other soft tissues: Small 1.7 cm intrinsically T1 hyperintense fluid collection with low T2 and T1 signal rim adjacent to the left L1 posterior elements, most consistent with a  small hematoma. Disc levels: T12-L1:  Negative. L1-L2: Unchanged mild retropulsion of the L1 posteroinferior endplate. No significant disc bulge or herniation. No stenosis. L2-L3: Unchanged asymmetric left-sided disc bulging and endplate  spurring. Unchanged moderate left neuroforaminal stenosis. No spinal canal or right neuroforaminal stenosis. L3-L4: Unchanged small broad-based posterior disc protrusion and mild bilateral facet arthropathy. No stenosis. L4-L5: Unchanged small broad-based posterior disc protrusion and moderate bilateral facet arthropathy. No stenosis. L5-S1: Unchanged small broad-based posterior disc protrusion eccentric to the right. Severe right and mild left facet arthropathy with bulky ligamentum flavum hypertrophy. New prominent right perifacet marrow edema. Unchanged mild right lateral recess stenosis and moderate right neuroforaminal stenosis. No spinal canal or left neuroforaminal stenosis. IMPRESSION: 1. Interval cement augmentation at L1 without progressive height loss. No new fracture or other acute abnormality. 2. Multilevel lumbar spondylosis as described above, overall similar to prior study. Unchanged moderate left neuroforaminal stenosis at L2-L3 and moderate right neuroforaminal stenosis at L5-S1. 3. Progressive severe right facet arthropathy at L5-S1 with new degenerative inflammatory change, which can be a source of pain. Electronically Signed   By: Titus Dubin M.D.   On: 01/13/2022 15:57   DG Chest Port 1 View  Result Date: 01/15/2022 CLINICAL DATA:  Atelectasis EXAM: PORTABLE CHEST 1 VIEW COMPARISON:  AP chest 01/12/2022, 05/27/2019 FINDINGS: Cardiac silhouette and mediastinal contours are unchanged with mild enlargement of the cardiac silhouette and tortuous thoracic aorta again seen. Moderate bilateral interstitial thickening appears mildly worsened from most recent prior 01/12/2022 radiograph and more remote 05/27/2019 radiograph. Left basilar linear scarring appears  chronic also on 05/27/2019 radiograph. It is difficult to exclude small pleural effusions. No pneumothorax is seen. Moderate multilevel degenerative disc changes of the thoracic spine with multilevel midthoracic and upper lumbar kyphoplasty cement. IMPRESSION: Chronic baseline interstitial scarring with superimposed interstitial thickening which may represent pulmonary edema versus pneumonia in the appropriate clinical setting. Electronically Signed   By: Yvonne Kendall M.D.   On: 01/15/2022 08:45   DG Chest Port 1 View  Result Date: 01/12/2022 CLINICAL DATA:  76 year old female with possible sepsis. EXAM: PORTABLE CHEST 1 VIEW COMPARISON:  CTA chest 05/28/2019 and earlier. FINDINGS: Portable AP upright view at 1153 hours. Chronically reduced lung volumes with evidence of interstitial lung disease on 2020 CT. Multifocal bilateral patchy and confluent new pulmonary opacity since radiographs in June 2020. No superimposed pneumothorax or pleural effusion. No definite consolidation. Previously augmented thoracic and lumbar compression fractures. Mediastinal contours remain normal. Visualized tracheal air column is within normal limits. No acute osseous abnormality identified. Negative visible bowel gas. IMPRESSION: Multifocal bilateral patchy lung opacity opacity superimposed on suspected chronic interstitial lung disease with diminished lung volumes. Favor acute bilateral pneumonia in the setting of suspected sepsis. No pleural effusion. Electronically Signed   By: Genevie Ann M.D.   On: 01/12/2022 12:07      ASSESSMENT/PLAN   Acute hypoxemic respiratory failure   Likely due to acute exacerbation of ILD/pulmonary fibrosis    - etiology may be LRTI with viral infection    - Resp viral panel -negative     - rheumatoid factor-360    - CRP trending down    -PCT is low 0.15     -agree with plaqenil 200 bid     - medrol dose pack started post bid Soluemedrol , repeat CXR today   Severe normocytic anemia    -  s/p blood transfusion      Patietn denies blood in stool or urine   Thank you for allowing me to participate in the care of this patient.   Patient/Family are satisfied with care plan and all questions have been answered.  This document was prepared using  [employer] forces training and education officer and may include unintentional dictation errors.     Ottie Glazier, M.D.  Division of [place]

## 2022-01-17 NOTE — Progress Notes (Addendum)
[facility]) Hospital Liaison Note   Received request from Transitions of Care Manager, Jiles Garter, for hospice services at home after discharge. Chart and patient information under review by [facility] physician. Hospice eligibility approved.   Spoke with Erin [name]/friend to initiate education related to hospice philosophy, services, and team approach to care. Erin [name] verbalized understanding of information given. Per discussion, the plan is for patient to discharge home via TBD once cleared to DC.    DME needs discussed. Patient has the following equipment in the home (Ada[employer]): Parker chair [facility] Bed Patient requests the following equipment for delivery: O2 (1 L)  Address verified, and Erin [name] is the family member to contact to arrange time of equipment delivery.    Please send signed and completed DNR home with patient/family. Please provide prescriptions at discharge as needed to ensure ongoing symptom management.    [facility] information and contact numbers given to family & above information shared with TOC.   Please call with any questions/concerns.    Thank you for the opportunity to participate in this patient's care.   Daphene Calamity, MSW [facility] Liaison  [phone]

## 2022-01-18 LAB — CULTURE, BLOOD (ROUTINE X 2)
Culture: NO GROWTH
Special Requests: ADEQUATE

## 2022-01-20 ENCOUNTER — Telehealth: Payer: Self-pay | Admitting: Urology

## 2022-01-20 NOTE — Telephone Encounter (Signed)
Mr.Dickens called and would like to know if he needs to schedule the pt a follow-up appt. She was seen on 01/09/2022. Would like for someone to to call him back if possible.

## 2022-01-21 NOTE — Telephone Encounter (Signed)
Please schedule appointment to discuss voiding trial vs. Long term foley. Thanks

## 2022-02-05 DEATH — deceased

## 2022-08-16 IMAGING — MG DIGITAL SCREENING BILAT W/ TOMO W/ CAD
6 of 10 series · 6 of 30 positions shown · non-contrast
Comparison: Previous exam(s).

CLINICAL DATA: Screening.

EXAM:
DIGITAL SCREENING BILATERAL MAMMOGRAM WITH TOMO AND [name]

[L CC synth-2D]
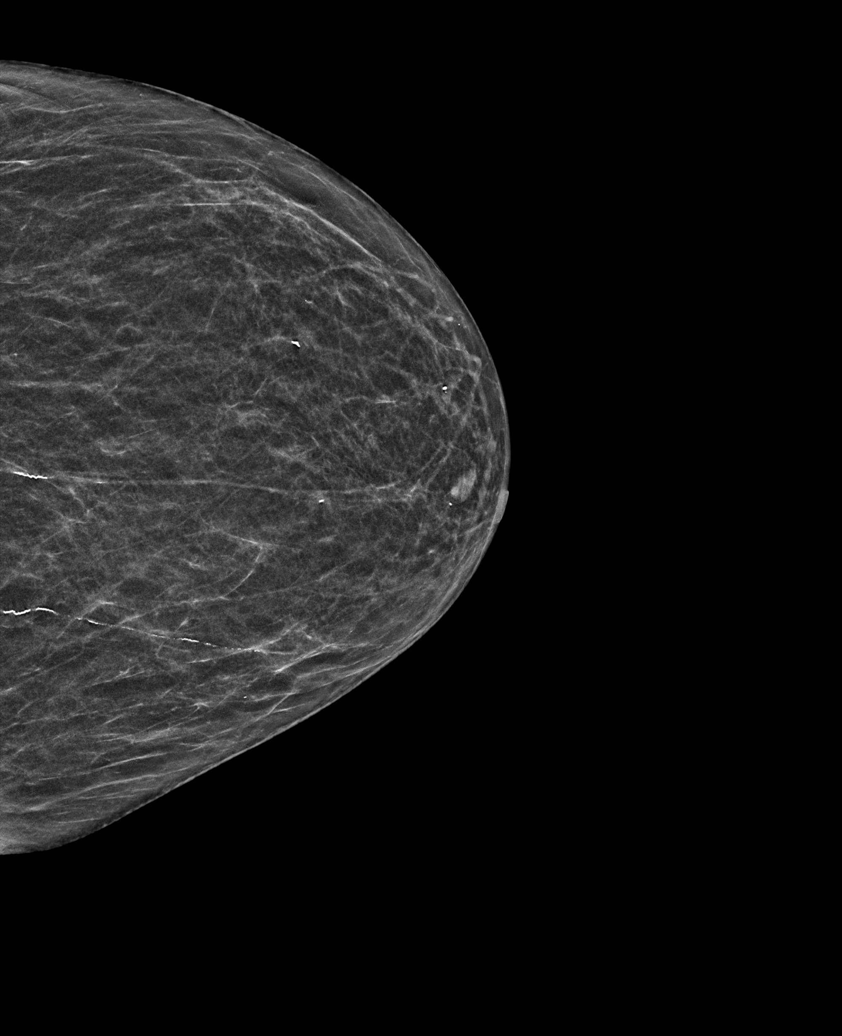

[R MLO synth-2D]
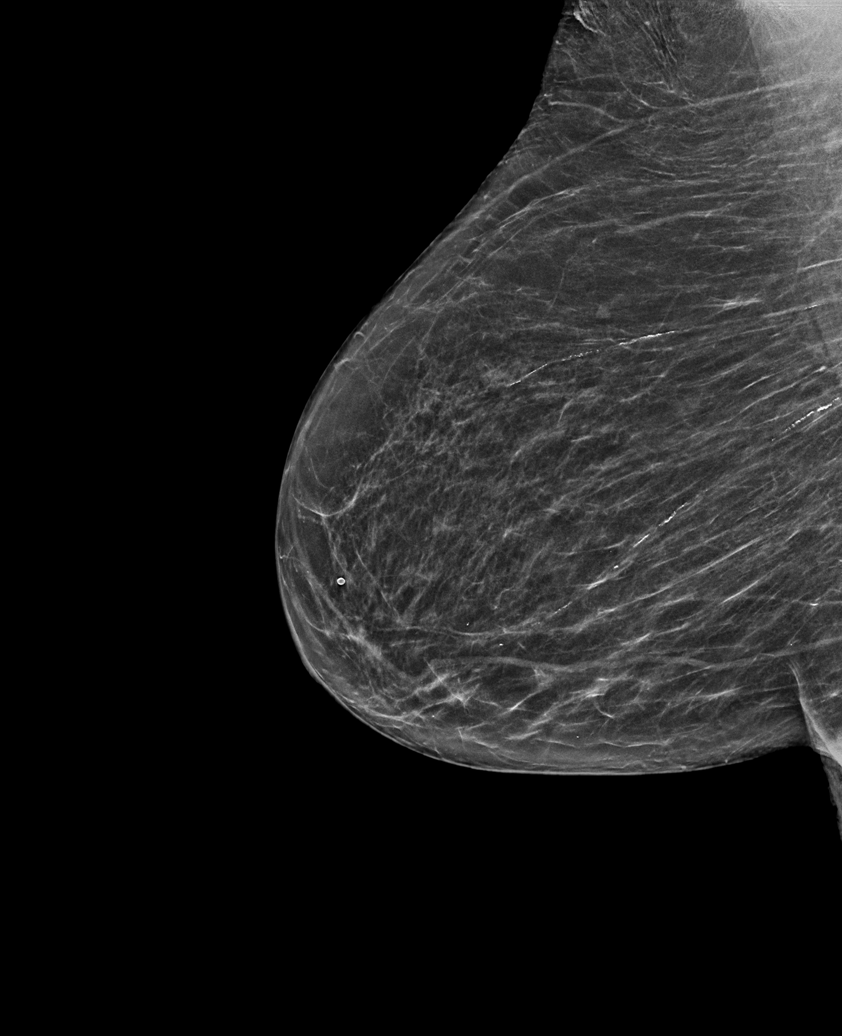

[L MLO synth-2D]
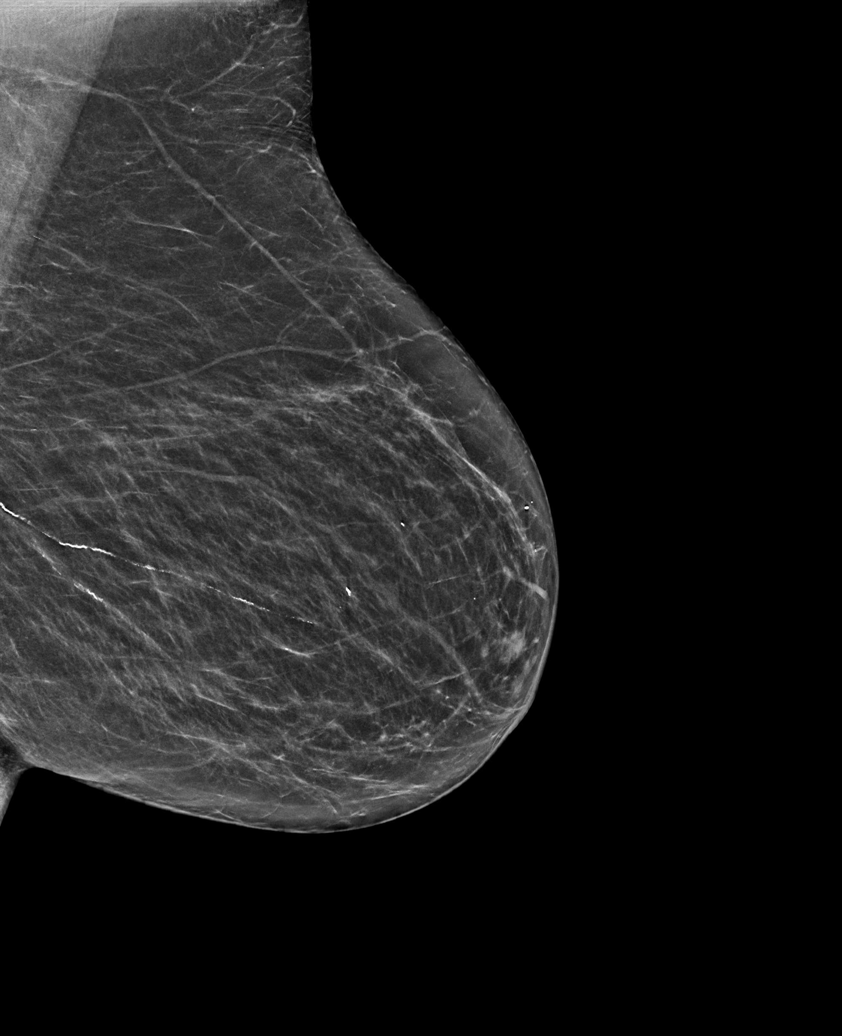

[R CC synth-2D (1 of 2)]
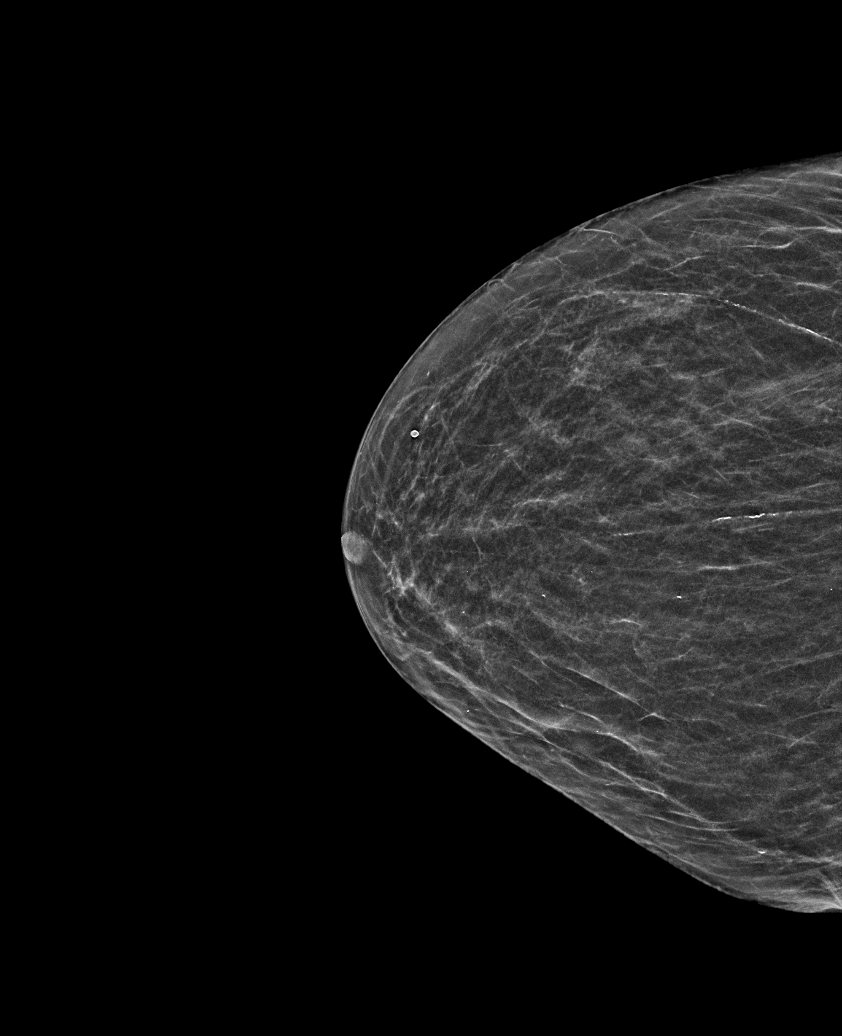

[R CC synth-2D (2 of 2)]
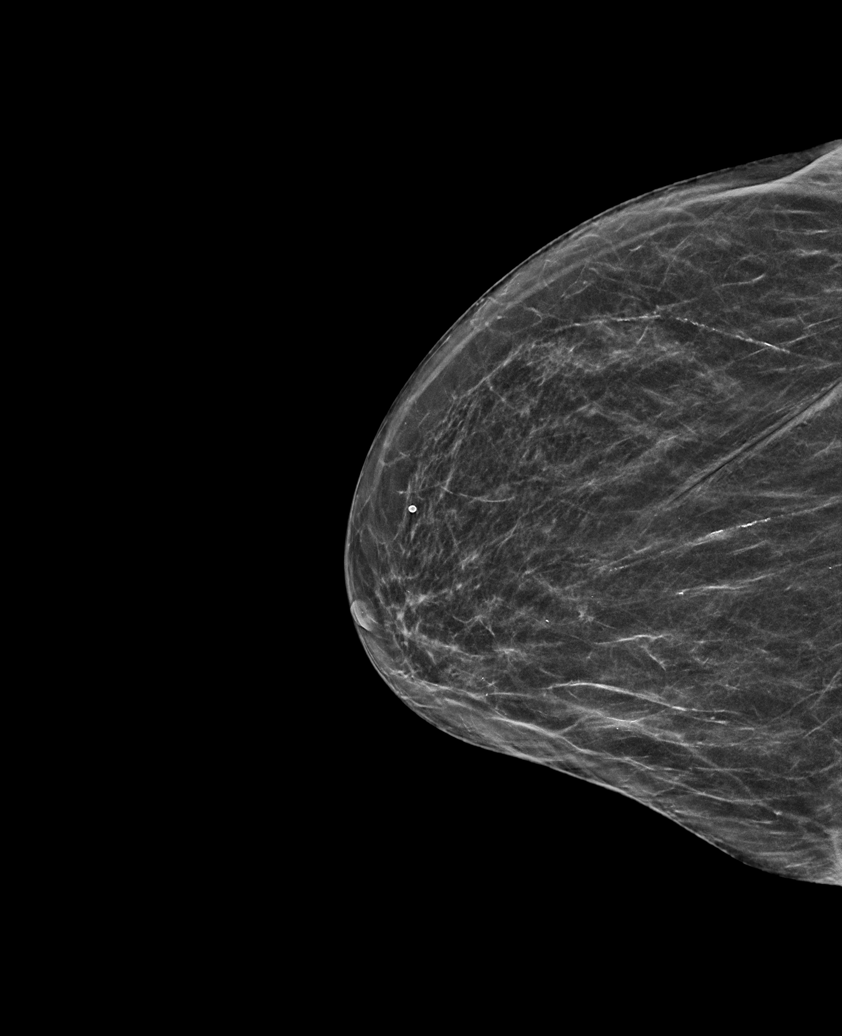

[L MLO tomo · tomo slice 32/63.0]
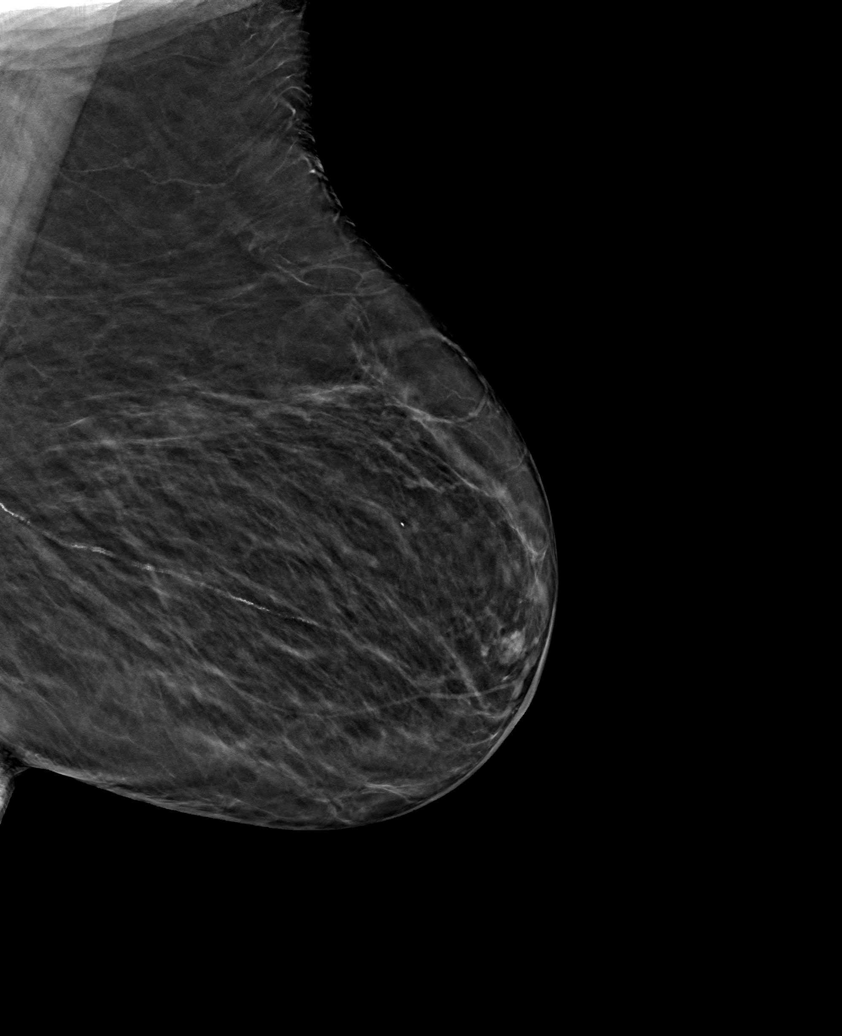

[6 of 30 positions shown; findings below may reference images not displayed]

ACR Breast Density Category b: There are scattered areas of
fibroglandular density.
FINDINGS: There are no findings suspicious for malignancy. Images were
processed with [name].
IMPRESSION: No mammographic evidence of malignancy. A result letter of this
screening mammogram will be mailed directly to the patient.

RECOMMENDATION:
Screening mammogram in one year. (Code:[id number])

BI-RADS CATEGORY  1: Negative.
